# Patient Record
Sex: Female | Born: 1945 | Race: Black or African American | Hispanic: No
Health system: Midwestern US, Community
[De-identification: ages and names within clinical notes are randomized; demographics above are authoritative.]

## PROBLEM LIST (undated history)

## (undated) DIAGNOSIS — Z96651 Presence of right artificial knee joint: Secondary | ICD-10-CM

## (undated) DIAGNOSIS — M25561 Pain in right knee: Secondary | ICD-10-CM

## (undated) DIAGNOSIS — G8929 Other chronic pain: Secondary | ICD-10-CM

## (undated) DIAGNOSIS — M1990 Unspecified osteoarthritis, unspecified site: Secondary | ICD-10-CM

## (undated) DIAGNOSIS — K219 Gastro-esophageal reflux disease without esophagitis: Secondary | ICD-10-CM

## (undated) DIAGNOSIS — R001 Bradycardia, unspecified: Secondary | ICD-10-CM

## (undated) DIAGNOSIS — F32A Depression, unspecified: Secondary | ICD-10-CM

## (undated) DIAGNOSIS — G4700 Insomnia, unspecified: Secondary | ICD-10-CM

## (undated) DIAGNOSIS — N3941 Urge incontinence: Secondary | ICD-10-CM

## (undated) DIAGNOSIS — F419 Anxiety disorder, unspecified: Secondary | ICD-10-CM

## (undated) DIAGNOSIS — F329 Major depressive disorder, single episode, unspecified: Secondary | ICD-10-CM

## (undated) DIAGNOSIS — R413 Other amnesia: Secondary | ICD-10-CM

## (undated) HISTORY — PX: COLONOSCOPY: SHX174

## (undated) HISTORY — DX: Depression, unspecified: F32.A

## (undated) HISTORY — DX: Major depressive disorder, single episode, unspecified: F32.9

## (undated) HISTORY — DX: Anxiety disorder, unspecified: F41.9

## (undated) HISTORY — PX: BIOPSY THYROID: PRO38

## (undated) HISTORY — PX: OTHER SURGICAL HISTORY: SHX169

## (undated) HISTORY — DX: Other amnesia: R41.3

## (undated) HISTORY — DX: Insomnia, unspecified: G47.00

## (undated) HISTORY — DX: Unspecified osteoarthritis, unspecified site: M19.90

## (undated) HISTORY — DX: Gastro-esophageal reflux disease without esophagitis: K21.9

## (undated) HISTORY — DX: Urge incontinence: N39.41

---

## 1993-03-23 HISTORY — PX: ABDOMINAL HYSTERECTOMY: SHX81

## 1998-05-20 ENCOUNTER — Other Ambulatory Visit: Admission: RE | Admit: 1998-05-20 | Discharge: 1998-05-20 | Payer: Self-pay | Admitting: Obstetrics and Gynecology

## 1999-12-30 ENCOUNTER — Other Ambulatory Visit: Admission: RE | Admit: 1999-12-30 | Discharge: 1999-12-30 | Payer: Self-pay | Admitting: Obstetrics and Gynecology

## 2000-05-06 ENCOUNTER — Encounter: Admission: RE | Admit: 2000-05-06 | Discharge: 2000-05-06 | Payer: Self-pay

## 2000-05-25 ENCOUNTER — Encounter: Payer: Self-pay | Admitting: Neurosurgery

## 2000-05-26 ENCOUNTER — Encounter: Payer: Self-pay | Admitting: Neurosurgery

## 2000-05-26 ENCOUNTER — Inpatient Hospital Stay (HOSPITAL_COMMUNITY): Admission: RE | Admit: 2000-05-26 | Discharge: 2000-05-27 | Payer: Self-pay | Admitting: Neurosurgery

## 2000-07-05 ENCOUNTER — Encounter: Payer: Self-pay | Admitting: Neurosurgery

## 2000-07-05 ENCOUNTER — Encounter: Admission: RE | Admit: 2000-07-05 | Discharge: 2000-07-05 | Payer: Self-pay | Admitting: Neurosurgery

## 2001-02-09 ENCOUNTER — Other Ambulatory Visit: Admission: RE | Admit: 2001-02-09 | Discharge: 2001-02-09 | Payer: Self-pay | Admitting: Obstetrics and Gynecology

## 2002-05-08 ENCOUNTER — Other Ambulatory Visit: Admission: RE | Admit: 2002-05-08 | Discharge: 2002-05-08 | Payer: Self-pay | Admitting: Obstetrics and Gynecology

## 2002-08-23 ENCOUNTER — Encounter (INDEPENDENT_AMBULATORY_CARE_PROVIDER_SITE_OTHER): Payer: Self-pay | Admitting: Specialist

## 2002-08-23 ENCOUNTER — Ambulatory Visit (HOSPITAL_COMMUNITY): Admission: RE | Admit: 2002-08-23 | Discharge: 2002-08-23 | Payer: Self-pay | Admitting: Gastroenterology

## 2003-07-25 ENCOUNTER — Other Ambulatory Visit: Admission: RE | Admit: 2003-07-25 | Discharge: 2003-07-25 | Payer: Self-pay | Admitting: Obstetrics and Gynecology

## 2006-02-14 ENCOUNTER — Emergency Department (HOSPITAL_COMMUNITY): Admission: EM | Admit: 2006-02-14 | Discharge: 2006-02-14 | Payer: Self-pay | Admitting: Emergency Medicine

## 2008-03-23 HISTORY — PX: NECK SURGERY: SHX720

## 2009-04-05 ENCOUNTER — Encounter: Admission: RE | Admit: 2009-04-05 | Discharge: 2009-04-05 | Payer: Self-pay | Admitting: Family Medicine

## 2010-08-08 NOTE — Op Note (Signed)
[place]  Patient:    [name], [name]                      MRN: [id number] Proc. Date: 05/26/00 Adm. Date:  [id number] Attending:  Colon Branch                           Operative Report  PREOPERATIVE DIAGNOSIS:  Herniated nucleus pulposus, C5-6 and C6-7.  POSTOPERATIVE DIAGNOSIS:  Herniated nucleus pulposus, C5-6 and C6-7.  PROCEDURE:  Anterior cervical diskectomy and fusion, C5-6 and C6-7, with allograft, anterior cervical plate, and tong traction.  SURGEON:  Clydene Fake, M.D.  ASSISTANT:  Izell [place]. Elesa Hacker, M.D.  ANESTHESIA:  General endotracheal tube anesthesia.  ESTIMATED BLOOD LOSS:  Minimal.  BLOOD GIVEN:  None.  DRAINS:  None.  COMPLICATIONS:  None.  REASON FOR PROCEDURE:  The patient is a 65 year old woman who has been having neck pain and numbness and weakness on the left.  She had left arm pain, and that has improved a bit but continues with the other symptoms.  MRI was done showing cord compression at 5-6 and 6-7 due to disk herniation central at 5-6 and broad-based, worse on the left at 6-7.  Patient brought in for surgery.  DESCRIPTION OF PROCEDURE:  The patient was brought in the operating room and general anesthesia was induced.  The patient was placed in Gardner-Wells tongs for traction through the case because of the amount of disk collapse.  The patient was prepped and draped in a sterile fashion.  The site of incision was injected with 7 cc 1% lidocaine with epinephrine.  Incision was made from the midline to the anterior border of the sternocleidomastoid muscle on the left side of the neck.  Incision taken down to the platysma and hemostasis obtained with Bovie cauterization.  The platysma was incised with the Bovie, and then blunt dissection was taken through the cervical fascia to the anterior cervical spine.  A large osteophyte was seen, and a needle was placed at the disk space and an x-ray was obtained  showing this was the 6-7 interspace.  The needle was removed as the disk space was incised with a 15 blade.  We continued the dissection until we reached the 5-6 and 6-7 space, and the longus colli muscle was reflected laterally to each side using the Bovie.  The self-retaining retractor system was then placed.  Both levels, but especially 6-7, had large osteophytes, and they were removed with the Kerrison punch. Disk space was then incised, again with a 15 blade, and partial diskectomy performed with pituitary rongeurs at both levels.  Because of the amount of disk collapse, especially at 6-7, the weight was placed off the Gardner-Wells tongs for traction, and 10 pounds of weight was used to open up the disk spaces.  The Cloward drill guide was placed over the 6-7 interspace, and a 10 mm Cloward drill was used to drill through the interspace and end plates, leaving a thin rim of posterior cortex.  This was repeated at 5-6.  Microscope was brought into the field for this.  At this point, further microdissection using 1 and 2 mm Kerrison punches.  A thin rim of posterior cortex along with disk herniation did herniate out.  The compression of the cord was removed at both levels.  Bilateral foraminotomies were performed.  There were some osteophytes and severe foraminal narrowing at the  left 6-7 level, and that was opened up.  This was done at both levels, and afterwards there was no compression on the spinal cord, and bilateral foramina were open at both levels.  Depth of the vertebral bodies was then measured with a depth gauge, and a 12 mm bone dowel for each level was cut to be a couple millimeters shorter in length.  With continued traction on the Gardner-Wells tongs, the 6-7 and then the 5-6 bone dowels were tapped into place.  They were countersunk about 1 mm, and we were able to feel behind the bone plugs, making sure there was room between the bone graft and the dura.  The wound  was irrigated with antibiotic solution, and the microscope was removed from the field.  A Tether anterior cervical plate was then placed over the C5 through C7 vertebrae, and two screws were placed in the C5 and two in the C7.  X-rays were obtained, showing good position of the screws, plates, and bone grafts. The retractor system was removed, hemostasis obtained with bipolar cauterization and Gelfoam and thrombin.  The Gelfoam was then irrigated out, and the platysma was closed with 3-0 interrupted Vicryl suture.  The subcutaneous tissue was closed with the same and the skin closed with Steri-Strips.  Dressing was placed, the Gardner-Wells tongs removed, and the patient was awakened from anesthesia and transferred to the recovery room in stable condition. DD:  05/26/00 TD:  05/27/00 Job: [id number]

## 2010-08-08 NOTE — Op Note (Signed)
NAME:  [name], [name]                         ACCOUNT NO.:  [id number]   MEDICAL RECORD NO.:  [id number]                   PATIENT TYPE:  AMB   LOCATION:  ENDO                                 FACILITY:  [facility]   PHYSICIAN:  Anselmo Rod, M.D.               DATE OF BIRTH:  1945/08/26   DATE OF PROCEDURE:  08/23/2002  DATE OF DISCHARGE:                                 OPERATIVE REPORT   PROCEDURE PERFORMED:  Colonoscopy with cold biopsies x8.   ENDOSCOPIST:  Anselmo Rod, M.D.   INSTRUMENT USED:  Olympus videocolonoscope.   INDICATION FOR THE PROCEDURE:  Screening colonoscopy being performed of a 65-  year-old African-American female to rule out colon polyps, masses, or  fistula.   PREPROCEDURE PREPARATION:  Informed consent was procured from the patient.  The patient was fasted for eight hours prior to the procedure and prepped  with a bottle of magnesium citrate and a gallon of GoLYTELY the night prior  to the procedure.   PREPROCEDURE PHYSICAL:  VITAL SIGNS:  Stable.  NECK:  Supple.  CHEST:  Clear to auscultation.  S1 and S2 regular. No murmur, rub, gallops,  or racing.  ABDOMEN:  Soft with normal bowel sounds.   DESCRIPTION OF THE PROCEDURE:  The patient was placed in the left lateral  decubitus position and sedated with 80 mg of Demerol and  7 mg Versed  intravenously.  Once the patient was adequately sedated and maintained on  low-flow oxygen and continuous cardiac monitoring, the Olympus  videocolonoscope was advanced from the rectum to the cecum without  difficulty.  The patient has a good prep.  The appendiceal orifice and the  ileocecal valve were clearly visualized and photographed.  Multiple small  sessile polyps were biopsied from 90 cm and from the left colon.  Retroflexion in the rectum revealed no abnormalities except for small  sessile polyps that seemed hyperplastic in nature.  A small external  hemorrhoid was seen on withdrawal of the scope.  The  patient tolerated the  procedure well without complications.  There was no evidence of  diverticulosis.   IMPRESSION:  1. Multiple small sessile hyperplastic-appearing polyps biopsied from 90 cm     and from the left colon.  2. Small external hemorrhoid.  3. No large masses or polyps seen.  4. No evidence of diverticulosis.    RECOMMENDATIONS:  1. Await pathology results.  2. Avoid all nonsteroidals including aspirin for now.  3. Outpatient followup in the next two weeks to go over the recommendations.                                               Anselmo Rod, M.D.    JNM/MEDQ  D:  08/23/2002  T:  08/23/2002  Job:  [id number]   cc:   Georgianne Fick, M.D.  [address]  [place]  [place] [address]  Fax: [phone]

## 2010-08-08 NOTE — H&P (Signed)
[place]. [facility]  Patient:    [name], [name]                     MRN: [id number] Adm. Date:  05/26/00 Attending:  Clydene Fake, M.D.                         History and Physical  CHIEF COMPLAINT:  Neck and left arm pain. Numbness and weakness.  HISTORY OF PRESENT ILLNESS:  The patient is a 65 year old woman who six weeks ago or so started having neck pain that radiated down to the left shoulder blade and in the left arm towards the middle finger with numbness in the same distribution. She has had some general weakness in that left arm. She has been having trouble typing and doing things at work, because it hurts more when she uses the arm. She has been on Vioxx steroid Dosepak and I guess over time has had some improvement over the last couple of weeks, but still has significant weakness and numbness. MRI was done showing degeneration and bulging at 5-6 and 6-7 with a central disk herniation at 5-6 which flattens the spinal cord causing visible canal stenosis. Also loss of CSF anteriorly. At 6-7 there is a broad based disk bulge, worse on the left with biforaminal narrowing, again worse on the left. There may be a soft disk herniation on the left side of the foramen. The patient is being admitted for anterior diskectomy and fusion.  PAST MEDICAL HISTORY:  Otherwise negative.  PREVIOUS OPERATIONS:  TAH in 1983.  CURRENT MEDICATIONS: 1. Premarin q.d. 2. Voltaren b.i.d.  ALLERGIES:  No known drug allergies.  SOCIAL HISTORY:  Married. Employed at [employer]. She smokes a few cigarettes a day and is working on quitting. Does not drink alcohol.  REVIEW OF SYSTEMS:  Otherwise negative.  FAMILY HISTORY:  Noncontributory.  PHYSICAL EXAMINATION:  GENERAL:  The patient is pleasant.  VITAL SIGNS:  Weight 198, height 5 feet 9 inches. Blood pressure 123/74, pulse 71, temperature 99.  HEENT:  Unremarkable.  LUNGS:   Clear.  HEART:  Regular rhythm.  ABDOMEN:  Soft and nontender.  EXTREMITIES:  Intact. No edema.  NECK AND NEUROLOGICAL:  Neck shows range of motion in the neck decreased in extension and lateral bending did increase left-sided neck pain. Positive Spurlings to the left. There is some left paraspinous muscle spasms. There is no brachial plexus done. Rapid alternating movements are slightly slowed on the left side and intact on the right. There is 4/5 strength in the left triceps and wrist extensors are intact on the right side. Sensation is decreased to light touch and pinprick in the left C7 distribution, and maybe a little bit in the C6. Gait is normal, tandem gait is done fairly well.  ASSESSMENT:  The patient is with left C7 radiculopathy, may be a slight C6 component and very minimal myelopathy. The patient is being admitted for two-level anterior cervical diskectomy and fusion at 5-6 and 6-7. DD:  05/26/00 TD:  05/26/00 Job: [id number]

## 2010-08-08 NOTE — Discharge Summary (Signed)
[place]. [facility]  Patient:    [name], [name]                      MRN: [id number] Adm. Date:  [id number] Disc. Date: [id number] Attending:  Colon Branch                           Discharge Summary  DIAGNOSIS:  Herniated nucleus pulposus cervical vertebrae-5/6 and cervical vertebrae-6/7.  DISCHARGE DIAGNOSIS:  Herniated nucleus pulposus cervical vertebrae-5/6 and cervical vertebrae-6/7.  PROCEDURES:  Anterior cervical discectomy fusion with C5/6 and C6/7 with allograft anterior cervical plate and retraction.  HISTORY OF PRESENT ILLNESS:  The patient is a 65 year old woman who has been having three weeks of neck pain, left arm pain.  The pain has actually improved the last couple of weeks but she has developed a weakness and numbness of the left arm and that continues.  MRI was done showing large central disc herniation and 5/6 compression of the spinal cord and a left-sided disc herniation causing some canal stenosis but also C7 root compression along with severe spondylosis and osteophytes that are closing in on the foramen.  The patient is admitted for two level ACS.  HOSPITAL COURSE:  The patient was admitted on the day of surgery and underwent the procedure named above without complications.  Postoperatively, she was transferred to the recovery room and then to the floor.  There she has been eating, ambulating, complains of some moderate neck pain, and slight sore throat.  She has noticed that her arm feels different. She does not have that numbness feeling and feels like it is probably stronger.  Exam does show that sensation is intact and improved tricep strength. Dressing was clean, dry, and intact.  She has been doing well and will be discharged home in stable condition.  DISCHARGE MEDICATIONS:  Discharge medications are the same as prehospitalization except no NSAIDs for three weeks.  Vicodin p.r.n. and Flexeril p.r.n.  C-collar on at  all times.  Keep incision dry for five days. Diet as tolerated.  Follow up in my office in three weeks. DD:  05/27/00 TD:  05/27/00 Job: [id number]

## 2010-08-13 ENCOUNTER — Other Ambulatory Visit: Payer: Self-pay | Admitting: Podiatry

## 2011-03-24 HISTORY — PX: BUNIONECTOMY: SHX129

## 2011-04-21 DIAGNOSIS — Z1231 Encounter for screening mammogram for malignant neoplasm of breast: Secondary | ICD-10-CM | POA: Diagnosis not present

## 2011-05-06 DIAGNOSIS — M25519 Pain in unspecified shoulder: Secondary | ICD-10-CM | POA: Diagnosis not present

## 2011-05-07 DIAGNOSIS — M25519 Pain in unspecified shoulder: Secondary | ICD-10-CM | POA: Diagnosis not present

## 2011-05-14 DIAGNOSIS — M25519 Pain in unspecified shoulder: Secondary | ICD-10-CM | POA: Diagnosis not present

## 2011-05-21 DIAGNOSIS — M25519 Pain in unspecified shoulder: Secondary | ICD-10-CM | POA: Diagnosis not present

## 2011-06-08 DIAGNOSIS — M2020 Hallux rigidus, unspecified foot: Secondary | ICD-10-CM | POA: Diagnosis not present

## 2011-08-05 DIAGNOSIS — T84498A Other mechanical complication of other internal orthopedic devices, implants and grafts, initial encounter: Secondary | ICD-10-CM | POA: Diagnosis not present

## 2011-08-05 DIAGNOSIS — Y831 Surgical operation with implant of artificial internal device as the cause of abnormal reaction of the patient, or of later complication, without mention of misadventure at the time of the procedure: Secondary | ICD-10-CM | POA: Diagnosis not present

## 2011-08-05 DIAGNOSIS — T84099A Other mechanical complication of unspecified internal joint prosthesis, initial encounter: Secondary | ICD-10-CM | POA: Diagnosis not present

## 2011-08-05 DIAGNOSIS — M2020 Hallux rigidus, unspecified foot: Secondary | ICD-10-CM | POA: Diagnosis not present

## 2011-08-06 DIAGNOSIS — R5383 Other fatigue: Secondary | ICD-10-CM | POA: Diagnosis not present

## 2011-08-06 DIAGNOSIS — I498 Other specified cardiac arrhythmias: Secondary | ICD-10-CM | POA: Diagnosis not present

## 2011-08-06 DIAGNOSIS — R9431 Abnormal electrocardiogram [ECG] [EKG]: Secondary | ICD-10-CM | POA: Diagnosis not present

## 2011-08-06 DIAGNOSIS — R002 Palpitations: Secondary | ICD-10-CM | POA: Diagnosis not present

## 2011-08-06 DIAGNOSIS — R5381 Other malaise: Secondary | ICD-10-CM | POA: Diagnosis not present

## 2011-08-11 DIAGNOSIS — M2020 Hallux rigidus, unspecified foot: Secondary | ICD-10-CM | POA: Diagnosis not present

## 2011-08-20 DIAGNOSIS — M2020 Hallux rigidus, unspecified foot: Secondary | ICD-10-CM | POA: Diagnosis not present

## 2011-08-24 DIAGNOSIS — M79609 Pain in unspecified limb: Secondary | ICD-10-CM | POA: Diagnosis not present

## 2011-08-31 DIAGNOSIS — M19039 Primary osteoarthritis, unspecified wrist: Secondary | ICD-10-CM | POA: Diagnosis not present

## 2011-09-07 DIAGNOSIS — M79609 Pain in unspecified limb: Secondary | ICD-10-CM | POA: Diagnosis not present

## 2011-09-11 DIAGNOSIS — M25519 Pain in unspecified shoulder: Secondary | ICD-10-CM | POA: Diagnosis not present

## 2011-11-10 DIAGNOSIS — H04129 Dry eye syndrome of unspecified lacrimal gland: Secondary | ICD-10-CM | POA: Diagnosis not present

## 2011-11-10 DIAGNOSIS — H1045 Other chronic allergic conjunctivitis: Secondary | ICD-10-CM | POA: Diagnosis not present

## 2011-12-18 DIAGNOSIS — H40019 Open angle with borderline findings, low risk, unspecified eye: Secondary | ICD-10-CM | POA: Diagnosis not present

## 2011-12-18 DIAGNOSIS — H25049 Posterior subcapsular polar age-related cataract, unspecified eye: Secondary | ICD-10-CM | POA: Diagnosis not present

## 2011-12-18 DIAGNOSIS — H04129 Dry eye syndrome of unspecified lacrimal gland: Secondary | ICD-10-CM | POA: Diagnosis not present

## 2012-03-14 DIAGNOSIS — H04129 Dry eye syndrome of unspecified lacrimal gland: Secondary | ICD-10-CM | POA: Diagnosis not present

## 2012-03-14 DIAGNOSIS — H1045 Other chronic allergic conjunctivitis: Secondary | ICD-10-CM | POA: Diagnosis not present

## 2012-03-28 DIAGNOSIS — H1045 Other chronic allergic conjunctivitis: Secondary | ICD-10-CM | POA: Diagnosis not present

## 2012-03-31 DIAGNOSIS — H5710 Ocular pain, unspecified eye: Secondary | ICD-10-CM | POA: Diagnosis not present

## 2012-04-01 DIAGNOSIS — H5789 Other specified disorders of eye and adnexa: Secondary | ICD-10-CM | POA: Diagnosis not present

## 2012-04-01 DIAGNOSIS — H1045 Other chronic allergic conjunctivitis: Secondary | ICD-10-CM | POA: Diagnosis not present

## 2012-04-01 DIAGNOSIS — H04129 Dry eye syndrome of unspecified lacrimal gland: Secondary | ICD-10-CM | POA: Diagnosis not present

## 2012-04-04 DIAGNOSIS — H02849 Edema of unspecified eye, unspecified eyelid: Secondary | ICD-10-CM | POA: Diagnosis not present

## 2012-04-12 DIAGNOSIS — H10019 Acute follicular conjunctivitis, unspecified eye: Secondary | ICD-10-CM | POA: Diagnosis not present

## 2012-04-25 DIAGNOSIS — H10019 Acute follicular conjunctivitis, unspecified eye: Secondary | ICD-10-CM | POA: Diagnosis not present

## 2012-06-09 DIAGNOSIS — H9319 Tinnitus, unspecified ear: Secondary | ICD-10-CM | POA: Diagnosis not present

## 2012-06-09 DIAGNOSIS — H8110 Benign paroxysmal vertigo, unspecified ear: Secondary | ICD-10-CM | POA: Diagnosis not present

## 2012-06-09 DIAGNOSIS — M542 Cervicalgia: Secondary | ICD-10-CM | POA: Diagnosis not present

## 2012-06-13 DIAGNOSIS — R002 Palpitations: Secondary | ICD-10-CM | POA: Diagnosis not present

## 2012-06-13 DIAGNOSIS — H8110 Benign paroxysmal vertigo, unspecified ear: Secondary | ICD-10-CM | POA: Diagnosis not present

## 2012-06-13 DIAGNOSIS — I498 Other specified cardiac arrhythmias: Secondary | ICD-10-CM | POA: Diagnosis not present

## 2012-06-20 ENCOUNTER — Other Ambulatory Visit: Payer: Self-pay | Admitting: Internal Medicine

## 2012-06-20 DIAGNOSIS — R42 Dizziness and giddiness: Secondary | ICD-10-CM

## 2012-06-20 DIAGNOSIS — H9319 Tinnitus, unspecified ear: Secondary | ICD-10-CM | POA: Diagnosis not present

## 2012-06-20 DIAGNOSIS — Z0000 Encounter for general adult medical examination without abnormal findings: Secondary | ICD-10-CM | POA: Diagnosis not present

## 2012-06-20 DIAGNOSIS — I498 Other specified cardiac arrhythmias: Secondary | ICD-10-CM | POA: Diagnosis not present

## 2012-06-20 DIAGNOSIS — H9190 Unspecified hearing loss, unspecified ear: Secondary | ICD-10-CM | POA: Diagnosis not present

## 2012-06-22 ENCOUNTER — Ambulatory Visit
Admission: RE | Admit: 2012-06-22 | Discharge: 2012-06-22 | Disposition: A | Discharge status: Home / Self Care | Payer: Medicare Other | Source: Ambulatory Visit | Attending: Internal Medicine | Admitting: Internal Medicine

## 2012-06-22 DIAGNOSIS — R51 Headache: Secondary | ICD-10-CM | POA: Diagnosis not present

## 2012-06-22 DIAGNOSIS — H9319 Tinnitus, unspecified ear: Secondary | ICD-10-CM | POA: Diagnosis not present

## 2012-06-22 DIAGNOSIS — R42 Dizziness and giddiness: Secondary | ICD-10-CM

## 2012-06-22 MED ORDER — GADOBENATE DIMEGLUMINE 529 MG/ML IV SOLN
20.0000 mL | Freq: Once | INTRAVENOUS | Status: AC | PRN
  Administered 2012-06-22: 20 mL via INTRAVENOUS

## 2012-06-24 ENCOUNTER — Other Ambulatory Visit: Payer: Self-pay

## 2012-06-28 ENCOUNTER — Other Ambulatory Visit: Payer: Self-pay

## 2012-07-20 DIAGNOSIS — R5381 Other malaise: Secondary | ICD-10-CM | POA: Diagnosis not present

## 2012-07-20 DIAGNOSIS — H9319 Tinnitus, unspecified ear: Secondary | ICD-10-CM | POA: Diagnosis not present

## 2012-07-20 DIAGNOSIS — F329 Major depressive disorder, single episode, unspecified: Secondary | ICD-10-CM | POA: Diagnosis not present

## 2012-08-18 DIAGNOSIS — J309 Allergic rhinitis, unspecified: Secondary | ICD-10-CM | POA: Diagnosis not present

## 2012-08-18 DIAGNOSIS — F39 Unspecified mood [affective] disorder: Secondary | ICD-10-CM | POA: Diagnosis not present

## 2012-08-18 DIAGNOSIS — IMO0001 Reserved for inherently not codable concepts without codable children: Secondary | ICD-10-CM | POA: Diagnosis not present

## 2012-08-18 DIAGNOSIS — R5383 Other fatigue: Secondary | ICD-10-CM | POA: Diagnosis not present

## 2012-08-18 DIAGNOSIS — R5381 Other malaise: Secondary | ICD-10-CM | POA: Diagnosis not present

## 2012-08-30 DIAGNOSIS — M25519 Pain in unspecified shoulder: Secondary | ICD-10-CM | POA: Diagnosis not present

## 2012-09-08 DIAGNOSIS — Z1159 Encounter for screening for other viral diseases: Secondary | ICD-10-CM | POA: Diagnosis not present

## 2012-09-08 DIAGNOSIS — IMO0001 Reserved for inherently not codable concepts without codable children: Secondary | ICD-10-CM | POA: Diagnosis not present

## 2012-09-08 DIAGNOSIS — R748 Abnormal levels of other serum enzymes: Secondary | ICD-10-CM | POA: Diagnosis not present

## 2012-09-08 DIAGNOSIS — R5381 Other malaise: Secondary | ICD-10-CM | POA: Diagnosis not present

## 2012-09-09 DIAGNOSIS — Z1231 Encounter for screening mammogram for malignant neoplasm of breast: Secondary | ICD-10-CM | POA: Diagnosis not present

## 2012-09-09 DIAGNOSIS — Z803 Family history of malignant neoplasm of breast: Secondary | ICD-10-CM | POA: Diagnosis not present

## 2012-09-19 ENCOUNTER — Ambulatory Visit (INDEPENDENT_AMBULATORY_CARE_PROVIDER_SITE_OTHER): Payer: Medicare Other

## 2012-09-19 ENCOUNTER — Ambulatory Visit (INDEPENDENT_AMBULATORY_CARE_PROVIDER_SITE_OTHER): Payer: Medicare Other | Admitting: Diagnostic Neuroimaging

## 2012-09-19 DIAGNOSIS — R748 Abnormal levels of other serum enzymes: Secondary | ICD-10-CM | POA: Diagnosis not present

## 2012-09-19 DIAGNOSIS — M3320 Polymyositis, organ involvement unspecified: Secondary | ICD-10-CM

## 2012-09-19 DIAGNOSIS — Z0289 Encounter for other administrative examinations: Secondary | ICD-10-CM

## 2012-09-19 DIAGNOSIS — M6281 Muscle weakness (generalized): Secondary | ICD-10-CM

## 2012-09-19 NOTE — Procedures (Addendum)
   [facility]  NCS (NERVE CONDUCTION STUDY) WITH EMG (ELECTROMYOGRAPHY) REPORT   STUDY DATE: 09/19/12 PATIENT NAME: [name] DOB: 09/22/1945 MRN: [id number]  ORDERING CLINICIAN: Dierdre Forth  TECHNOLOGIST: Gearldine Shown ELECTROMYOGRAPHER: Glenford Bayley. Achsah Mcquade, MD  CLINICAL INFORMATION: 67 year old female with bilateral proximal leg weakness. Abnormal CK elevation. Evaluate for myopathy. Exam shows diffuse weakness of bilateral hip flexon 3/5, knee extension 4/5, knee flexion 4/5, foot dorsiflexion 4/5. Reflexes are 2+ throughout.  FINDINGS: NERVE CONDUCTION STUDY: Right median, right ulnar, right peroneal and right tibial motor responses have normal distal latencies, amplitudes, conduction velocities and F-wave latencies. Right H reflex response with stimulation of the tibial nerves and recording over the soleus muscle is normal. Right median, right ulnar, right superficial peroneal sensory responses are normal.  NEEDLE ELECTROMYOGRAPHY: Needle examination of selected muscles of the right lower extremity (iliopsoas, vastus medialis, tibialis anterior, gastrocnemius, gluteus medius) shows no abnormal spontaneous activity at rest and normal motor unit recruitment on exertion. Right L4-5 and L5-S1 paraspinal muscles are normal.  IMPRESSION:  This is a normal study. No electrodiagnostic evidence of large fiber neuropathy, lumbar radiculopathy or myopathy at this time.   INTERPRETING PHYSICIAN:  Suanne Marker, MD Certified in Neurology, Neurophysiology and Neuroimaging  [facility] [address] [place], [place] [address] [phone]

## 2012-09-21 DIAGNOSIS — M3320 Polymyositis, organ involvement unspecified: Secondary | ICD-10-CM | POA: Diagnosis not present

## 2012-09-22 ENCOUNTER — Encounter (INDEPENDENT_AMBULATORY_CARE_PROVIDER_SITE_OTHER): Payer: Self-pay

## 2012-09-28 ENCOUNTER — Ambulatory Visit (INDEPENDENT_AMBULATORY_CARE_PROVIDER_SITE_OTHER): Payer: Medicare Other | Admitting: General Surgery

## 2012-09-28 ENCOUNTER — Encounter (INDEPENDENT_AMBULATORY_CARE_PROVIDER_SITE_OTHER): Payer: Self-pay | Admitting: General Surgery

## 2012-09-28 ENCOUNTER — Encounter (HOSPITAL_BASED_OUTPATIENT_CLINIC_OR_DEPARTMENT_OTHER): Payer: Self-pay | Admitting: *Deleted

## 2012-09-28 VITALS — BP 128/76 | HR 64 | Temp 97.1°F | Resp 15 | Ht 68.0 in | Wt 221.2 lb

## 2012-09-28 DIAGNOSIS — M3320 Polymyositis, organ involvement unspecified: Secondary | ICD-10-CM

## 2012-09-28 NOTE — Progress Notes (Signed)
Patient ID: [name], female   DOB: 15-Apr-1945, 67 y.o.   MRN: [id number]  Chief Complaint  Patient presents with  . New Evaluation    quadricep muscle biospy    HPI [name] is a 67 y.o. female.   HPI  She is referred by Dr. Dierdre Forth for a quadriceps muscle biopsy. 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 She began having weakness in April. She is noted to have elevated CK and aldolase levels.  She is here to discuss quadriceps muscle biopsy.  Past Medical History  Diagnosis Date  . Arthritis     Past Surgical History  Procedure Laterality Date  . Neck surgery  2010    disc fused     Family History  Problem Relation Age of Onset  . Hypertension Mother   . Cancer Sister     Social History History  Substance Use Topics  . Smoking status: Former Smoker    Quit date: 03/23/2008  . Smokeless tobacco: Never Used  . Alcohol Use: No    Not on File  Current Outpatient Prescriptions  Medication Sig Dispense Refill  . fish oil-omega-3 fatty acids 1000 MG capsule Take 2 g by mouth daily.      . Multiple Vitamins-Minerals (CENTRUM SILVER ADULT 50+ PO) Take by mouth.      . sertraline (ZOLOFT) 100 MG tablet Take 100 mg by mouth daily.       No current facility-administered medications for this visit.    Review of Systems Review of Systems  Respiratory: Negative.   Cardiovascular: Negative.   Musculoskeletal: Positive for arthralgias.  Neurological: Positive for weakness.    Blood pressure 128/76, pulse 64, temperature 97.1 F (36.2 C), temperature source Temporal, resp. rate 15, height 5\' 8"  (1.727 m), weight 221 lb 3.2 oz (100.336 kg).  Physical Exam Physical Exam  Constitutional:  Obese female in NAD.  HENT:  Head: Normocephalic and atraumatic.  Musculoskeletal:  4/5 hip flexor strength bilaterally. Also has some proximal weakness in both upper extremities. No scars or masses on left thigh area.     Data Reviewed Notes from Dr. Dierdre Forth.  Assessment    Dry  suspicion for polymyositis.  She states she relies more on her right leg than her left leg.     Plan    Left quadriceps muscle biopsy. The procedure and risks were discussed with her. Risks include but are not limited to bleeding, infection, which milligrams, and reaction to the anesthesia. Both she and her husband seem to understand and agree with the plan.        [name] J 09/28/2012, 12:10 PM

## 2012-09-28 NOTE — Patient Instructions (Signed)
Stop your fish oil 3-5 days before the surgery.

## 2012-09-28 NOTE — Progress Notes (Signed)
[facility] ordered [id number]-to come in for labs

## 2012-09-30 ENCOUNTER — Encounter (HOSPITAL_BASED_OUTPATIENT_CLINIC_OR_DEPARTMENT_OTHER)
Admission: RE | Admit: 2012-09-30 | Discharge: 2012-09-30 | Disposition: A | Discharge status: Home / Self Care | Payer: Medicare Other | Source: Ambulatory Visit | Attending: General Surgery | Admitting: General Surgery

## 2012-09-30 DIAGNOSIS — Z79899 Other long term (current) drug therapy: Secondary | ICD-10-CM | POA: Diagnosis not present

## 2012-09-30 DIAGNOSIS — M6250 Muscle wasting and atrophy, not elsewhere classified, unspecified site: Secondary | ICD-10-CM | POA: Diagnosis not present

## 2012-09-30 DIAGNOSIS — M6281 Muscle weakness (generalized): Secondary | ICD-10-CM | POA: Diagnosis not present

## 2012-09-30 DIAGNOSIS — Z6833 Body mass index (BMI) 33.0-33.9, adult: Secondary | ICD-10-CM | POA: Diagnosis not present

## 2012-09-30 DIAGNOSIS — E669 Obesity, unspecified: Secondary | ICD-10-CM | POA: Diagnosis not present

## 2012-09-30 DIAGNOSIS — Z87891 Personal history of nicotine dependence: Secondary | ICD-10-CM | POA: Diagnosis not present

## 2012-09-30 DIAGNOSIS — Z809 Family history of malignant neoplasm, unspecified: Secondary | ICD-10-CM | POA: Diagnosis not present

## 2012-09-30 DIAGNOSIS — R748 Abnormal levels of other serum enzymes: Secondary | ICD-10-CM | POA: Diagnosis not present

## 2012-09-30 DIAGNOSIS — M129 Arthropathy, unspecified: Secondary | ICD-10-CM | POA: Diagnosis not present

## 2012-09-30 LAB — CBC WITH DIFFERENTIAL/PLATELET
Basophils Absolute: 0 10*3/uL (ref 0.0–0.1)
Lymphocytes Relative: 33 % (ref 12–46)
Lymphs Abs: 2 10*3/uL (ref 0.7–4.0)
Neutrophils Relative %: 55 % (ref 43–77)
Platelets: 213 10*3/uL (ref 150–400)
RBC: 4.72 MIL/uL (ref 3.87–5.11)
WBC: 6.1 10*3/uL (ref 4.0–10.5)

## 2012-09-30 LAB — COMPREHENSIVE METABOLIC PANEL
ALT: 17 U/L (ref 0–35)
AST: 22 U/L (ref 0–37)
Alkaline Phosphatase: 84 U/L (ref 39–117)
CO2: 28 mEq/L (ref 19–32)
GFR calc Af Amer: 68 mL/min — ABNORMAL LOW (ref >90)
GFR calc non Af Amer: 59 mL/min — ABNORMAL LOW (ref >90)
Glucose, Bld: 95 mg/dL (ref 70–99)
Potassium: 4.1 mEq/L (ref 3.5–5.1)
Sodium: 138 mEq/L (ref 135–145)
Total Protein: 6.9 g/dL (ref 6.0–8.3)

## 2012-10-03 ENCOUNTER — Encounter (HOSPITAL_BASED_OUTPATIENT_CLINIC_OR_DEPARTMENT_OTHER): Payer: Self-pay | Admitting: Certified Registered"

## 2012-10-03 ENCOUNTER — Ambulatory Visit (HOSPITAL_BASED_OUTPATIENT_CLINIC_OR_DEPARTMENT_OTHER): Payer: Medicare Other | Admitting: Certified Registered"

## 2012-10-03 ENCOUNTER — Ambulatory Visit (HOSPITAL_BASED_OUTPATIENT_CLINIC_OR_DEPARTMENT_OTHER)
Admission: RE | Admit: 2012-10-03 | Discharge: 2012-10-03 | Disposition: A | Discharge status: Home / Self Care | Payer: Medicare Other | Source: Ambulatory Visit | Attending: General Surgery | Admitting: General Surgery

## 2012-10-03 ENCOUNTER — Encounter (HOSPITAL_BASED_OUTPATIENT_CLINIC_OR_DEPARTMENT_OTHER)
Admission: RE | Disposition: A | Discharge status: Home / Self Care | Payer: Self-pay | Source: Ambulatory Visit | Attending: General Surgery

## 2012-10-03 DIAGNOSIS — R748 Abnormal levels of other serum enzymes: Secondary | ICD-10-CM | POA: Insufficient documentation

## 2012-10-03 DIAGNOSIS — M3320 Polymyositis, organ involvement unspecified: Secondary | ICD-10-CM | POA: Diagnosis not present

## 2012-10-03 DIAGNOSIS — Z6833 Body mass index (BMI) 33.0-33.9, adult: Secondary | ICD-10-CM | POA: Insufficient documentation

## 2012-10-03 DIAGNOSIS — Z79899 Other long term (current) drug therapy: Secondary | ICD-10-CM | POA: Insufficient documentation

## 2012-10-03 DIAGNOSIS — Z87891 Personal history of nicotine dependence: Secondary | ICD-10-CM | POA: Insufficient documentation

## 2012-10-03 DIAGNOSIS — M6281 Muscle weakness (generalized): Secondary | ICD-10-CM

## 2012-10-03 DIAGNOSIS — M129 Arthropathy, unspecified: Secondary | ICD-10-CM | POA: Diagnosis not present

## 2012-10-03 DIAGNOSIS — E669 Obesity, unspecified: Secondary | ICD-10-CM | POA: Insufficient documentation

## 2012-10-03 DIAGNOSIS — M6250 Muscle wasting and atrophy, not elsewhere classified, unspecified site: Secondary | ICD-10-CM | POA: Diagnosis not present

## 2012-10-03 DIAGNOSIS — Z809 Family history of malignant neoplasm, unspecified: Secondary | ICD-10-CM | POA: Insufficient documentation

## 2012-10-03 HISTORY — PX: MUSCLE BIOPSY: SHX716

## 2012-10-03 SURGERY — MUSCLE BIOPSY
Anesthesia: Monitor Anesthesia Care | Site: Leg Upper | Laterality: Left | Wound class: Clean

## 2012-10-03 MED ORDER — ACETAMINOPHEN 325 MG PO TABS: 650.0000 mg | ORAL_TABLET | ORAL | Status: DC | PRN

## 2012-10-03 MED ORDER — CEFAZOLIN SODIUM-DEXTROSE 2-3 GM-% IV SOLR
2.0000 g | INTRAVENOUS | Status: AC
  Administered 2012-10-03: 2 g via INTRAVENOUS

## 2012-10-03 MED ORDER — FENTANYL CITRATE 0.05 MG/ML IJ SOLN: 25.0000 ug | INTRAMUSCULAR | Status: DC | PRN

## 2012-10-03 MED ORDER — MIDAZOLAM HCL 5 MG/5ML IJ SOLN
INTRAMUSCULAR | Status: DC | PRN
  Administered 2012-10-03: 2 mg via INTRAVENOUS

## 2012-10-03 MED ORDER — ACETAMINOPHEN 650 MG RE SUPP: 650.0000 mg | RECTAL | Status: DC | PRN

## 2012-10-03 MED ORDER — LIDOCAINE HCL (CARDIAC) 20 MG/ML IV SOLN
INTRAVENOUS | Status: DC | PRN
  Administered 2012-10-03: 40 mg via INTRAVENOUS

## 2012-10-03 MED ORDER — OXYCODONE HCL 5 MG PO TABS: 5.0000 mg | ORAL_TABLET | ORAL | Status: DC | PRN

## 2012-10-03 MED ORDER — ONDANSETRON HCL 4 MG/2ML IJ SOLN: 4.0000 mg | Freq: Four times a day (QID) | INTRAMUSCULAR | Status: DC | PRN

## 2012-10-03 MED ORDER — PROPOFOL INFUSION 10 MG/ML OPTIME
INTRAVENOUS | Status: DC | PRN
  Administered 2012-10-03: 140 ug/kg/min via INTRAVENOUS

## 2012-10-03 MED ORDER — SODIUM CHLORIDE 0.9 % IJ SOLN: 3.0000 mL | INTRAMUSCULAR | Status: DC | PRN

## 2012-10-03 MED ORDER — FENTANYL CITRATE 0.05 MG/ML IJ SOLN
INTRAMUSCULAR | Status: DC | PRN
  Administered 2012-10-03 (×2): 50 ug via INTRAVENOUS

## 2012-10-03 MED ORDER — OXYCODONE HCL 5 MG PO TABS
5.0000 mg | ORAL_TABLET | Freq: Once | ORAL | Status: AC | PRN
  Administered 2012-10-03: 5 mg via ORAL

## 2012-10-03 MED ORDER — OXYCODONE HCL 5 MG/5ML PO SOLN: 5.0000 mg | Freq: Once | ORAL | Status: AC | PRN

## 2012-10-03 MED ORDER — HYDROCODONE-ACETAMINOPHEN 5-325 MG PO TABS
1.0000 | ORAL_TABLET | ORAL | Status: DC | PRN
Start: 2012-10-03 — End: 2013-04-20

## 2012-10-03 MED ORDER — ONDANSETRON HCL 4 MG/2ML IJ SOLN
INTRAMUSCULAR | Status: DC | PRN
  Administered 2012-10-03: 4 mg via INTRAVENOUS

## 2012-10-03 MED ORDER — LIDOCAINE-EPINEPHRINE (PF) 1 %-1:200000 IJ SOLN
INTRAMUSCULAR | Status: DC | PRN
  Administered 2012-10-03: 14 mL

## 2012-10-03 MED ORDER — LACTATED RINGERS IV SOLN
INTRAVENOUS | Status: DC
  Administered 2012-10-03: 10:00:00 via INTRAVENOUS

## 2012-10-03 SURGICAL SUPPLY — 44 items
APL SKNCLS STERI-STRIP NONHPOA (GAUZE/BANDAGES/DRESSINGS) ×1
BENZOIN TINCTURE PRP APPL 2/3 (GAUZE/BANDAGES/DRESSINGS) ×2 IMPLANT
BLADE SURG 15 STRL LF DISP TIS (BLADE) ×1 IMPLANT
BLADE SURG 15 STRL SS (BLADE) ×2
BLADE SURG ROTATE 9660 (MISCELLANEOUS) IMPLANT
CANISTER SUCTION 1200CC (MISCELLANEOUS) IMPLANT
CHLORAPREP W/TINT 26ML (MISCELLANEOUS) ×2 IMPLANT
CLEANER CAUTERY TIP 5X5 PAD (MISCELLANEOUS) ×1 IMPLANT
CLOTH BEACON ORANGE TIMEOUT ST (SAFETY) ×2 IMPLANT
COVER MAYO STAND STRL (DRAPES) ×2 IMPLANT
COVER TABLE BACK 60X90 (DRAPES) ×2 IMPLANT
DECANTER SPIKE VIAL GLASS SM (MISCELLANEOUS) IMPLANT
DRAPE LAPAROTOMY T 102X78X121 (DRAPES) ×2 IMPLANT
DRAPE UTILITY XL STRL (DRAPES) ×2 IMPLANT
DRSG TEGADERM 4X4.75 (GAUZE/BANDAGES/DRESSINGS) ×2 IMPLANT
ELECT REM PT RETURN 9FT ADLT (ELECTROSURGICAL) ×2
ELECTRODE REM PT RTRN 9FT ADLT (ELECTROSURGICAL) ×1 IMPLANT
GAUZE SPONGE 4X4 12PLY STRL LF (GAUZE/BANDAGES/DRESSINGS) ×2 IMPLANT
GAUZE SPONGE 4X4 16PLY XRAY LF (GAUZE/BANDAGES/DRESSINGS) IMPLANT
GLOVE BIOGEL M 7.0 STRL (GLOVE) ×2 IMPLANT
GLOVE BIOGEL PI IND STRL 7.5 (GLOVE) ×1 IMPLANT
GLOVE BIOGEL PI IND STRL 8.5 (GLOVE) ×1 IMPLANT
GLOVE BIOGEL PI INDICATOR 7.5 (GLOVE) ×1
GLOVE BIOGEL PI INDICATOR 8.5 (GLOVE) ×1
GLOVE ECLIPSE 8.0 STRL XLNG CF (GLOVE) ×2 IMPLANT
GOWN PREVENTION PLUS XLARGE (GOWN DISPOSABLE) ×2 IMPLANT
NEEDLE HYPO 25X1 1.5 SAFETY (NEEDLE) ×2 IMPLANT
NS IRRIG 1000ML POUR BTL (IV SOLUTION) ×2 IMPLANT
PACK BASIN DAY SURGERY FS (CUSTOM PROCEDURE TRAY) ×2 IMPLANT
PAD CLEANER CAUTERY TIP 5X5 (MISCELLANEOUS) ×1
PENCIL BUTTON HOLSTER BLD 10FT (ELECTRODE) ×2 IMPLANT
SLEEVE SCD COMPRESS KNEE MED (MISCELLANEOUS) IMPLANT
STRIP CLOSURE SKIN 1/2X4 (GAUZE/BANDAGES/DRESSINGS) ×2 IMPLANT
SUT MON AB 4-0 PC3 18 (SUTURE) ×2 IMPLANT
SUT PROLENE 2 0 CT2 30 (SUTURE) IMPLANT
SUT VIC AB 2-0 SH 27 (SUTURE) ×2
SUT VIC AB 2-0 SH 27XBRD (SUTURE) ×1 IMPLANT
SUT VIC AB 3-0 SH 27 (SUTURE) ×2
SUT VIC AB 3-0 SH 27X BRD (SUTURE) ×1 IMPLANT
SYR CONTROL 10ML LL (SYRINGE) ×2 IMPLANT
TOWEL OR 17X24 6PK STRL BLUE (TOWEL DISPOSABLE) ×2 IMPLANT
TOWEL OR NON WOVEN STRL DISP B (DISPOSABLE) ×2 IMPLANT
TUBE CONNECTING 20X1/4 (TUBING) IMPLANT
YANKAUER SUCT BULB TIP NO VENT (SUCTIONS) IMPLANT

## 2012-10-03 NOTE — Interval H&P Note (Signed)
History and Physical Interval Note:  10/03/2012 10:29 AM  [name]  has presented today for surgery, with the diagnosis of polymyositis  The various methods of treatment have been discussed with the patient and family. After consideration of risks, benefits and other options for treatment, the patient has consented to  Procedure(s): LEFT QUADRICEP MUSCLE BIOPSY (Left) as a surgical intervention .  The patient's history has been reviewed, patient examined, no change in status, stable for surgery.  I have reviewed the patient's chart and labs.  Questions were answered to the patient's satisfaction.     Leary Mcnulty [place]la [place]

## 2012-10-03 NOTE — Transfer of Care (Signed)
Immediate Anesthesia Transfer of Care Note  Patient: [name]  Procedure(s) Performed: Procedure(s): LEFT QUADRICEP MUSCLE BIOPSY (Left)  Patient Location: PACU  Anesthesia Type:MAC  Level of Consciousness: awake, alert , oriented and patient cooperative  Airway & Oxygen Therapy: Patient Spontanous Breathing and Patient connected to face mask oxygen  Post-op Assessment: Report given to PACU RN and Post -op Vital signs reviewed and stable  Post vital signs: Reviewed and stable  Complications: No apparent anesthesia complications

## 2012-10-03 NOTE — Anesthesia Procedure Notes (Signed)
Procedure Name: MAC Date/Time: 10/03/2012 10:52 AM Performed by: Verlan Friends Pre-anesthesia Checklist: Patient identified, Timeout performed, Emergency Drugs available, Suction available and Patient being monitored Patient Re-evaluated:Patient Re-evaluated prior to inductionOxygen Delivery Method: Simple face mask Placement Confirmation: positive ETCO2

## 2012-10-03 NOTE — Anesthesia Postprocedure Evaluation (Signed)
Anesthesia Post Note  Patient: [name]  Procedure(s) Performed: Procedure(s) (LRB): LEFT QUADRICEP MUSCLE BIOPSY (Left)  Anesthesia type: MAC  Patient location: PACU  Post pain: Pain level controlled and Adequate analgesia  Post assessment: Post-op Vital signs reviewed, Patient's Cardiovascular Status Stable and Respiratory Function Stable  Last Vitals:  Filed Vitals:   10/03/12 1215  BP: 133/56  Pulse: 44  Temp:   Resp: 16    Post vital signs: Reviewed and stable  Level of consciousness: awake, alert  and oriented  Complications: No apparent anesthesia complications

## 2012-10-03 NOTE — Anesthesia Preprocedure Evaluation (Signed)
Anesthesia Evaluation  Patient identified by MRN, date of birth, ID band Patient awake    Reviewed: Allergy & Precautions, H&P , NPO status , Patient's Chart, lab work & pertinent test results  Airway Mallampati: II  Neck ROM: full    Dental   Pulmonary former smoker,          Cardiovascular     Neuro/Psych    GI/Hepatic   Endo/Other  obese  Renal/GU      Musculoskeletal  (+) Arthritis -,   Abdominal   Peds  Hematology   Anesthesia Other Findings   Reproductive/Obstetrics                           Anesthesia Physical Anesthesia Plan  ASA: II  Anesthesia Plan: MAC   Post-op Pain Management:    Induction: Intravenous  Airway Management Planned: Simple Face Mask  Additional Equipment:   Intra-op Plan:   Post-operative Plan:   Informed Consent: I have reviewed the patients History and Physical, chart, labs and discussed the procedure including the risks, benefits and alternatives for the proposed anesthesia with the patient or authorized representative who has indicated his/her understanding and acceptance.     Plan Discussed with: CRNA, Anesthesiologist and Surgeon  Anesthesia Plan Comments:         Anesthesia Quick Evaluation

## 2012-10-03 NOTE — Op Note (Signed)
Operative Note  LUCINDY BOREL female 67 y.o. 10/03/2012  PREOPERATIVE DX:  Diffuse muscle weakness, possibly polymyositis  POSTOPERATIVE DX:  Same  PROCEDURE:  Left quadriceps muscle biopsy         Surgeon: Adolph Pollack   Assistants: None  Anesthesia: Monitored Local Anesthesia with Sedation  Indications:   This is a 68 year old female with progressive diffuse muscle weakness and elevated CPKs. There is a concern for polymyositis. She now presents for left quadriceps muscle biopsy.    Procedure Detail:  She was seen in the holding area and the left thigh marked by initial. She is brought to the operating room, placed supine on the operating table, and given intravenous sedation. The anterior aspect of the left thigh was sterilely prepped and draped.  Local anesthetic consisting of Xylocaine with epinephrine was infiltrated in the subdermal and subcutaneous areas. A longitudinal small incision was made through the skin and subcutaneous tissue until the fascia was identified. A small incision was made in the fascia and underlying quadriceps muscle identified. A 3 cm segment of muscle was then removed by clamping it between hemostats and excising it sharply. This was sent to pathology.  The ends of the cut muscle the suture-ligated with 3-0 Vicryl suture. Some other bleeding areas of the muscle were treated electrocautery and hemostasis was adequate.  The fascia was then closed with a running 2-0 Vicryl suture. The subcutaneous tissues were approximated with a running 3-0 Vicryl suture. The skin is close with 4-0 Monocryl subcuticular stitch. Steri-Strips and sterile dressing applied.  She tolerated the procedure well without any apparent complications and was taken to the recovery room in satisfactory condition.  Estimated Blood Loss:  Minimal         Drains: none  Blood Given: none          Specimens: A small segment of left quadriceps muscle        Complications:  * No  complications entered in OR log *         Disposition: PACU - hemodynamically stable.         Condition: stable

## 2012-10-03 NOTE — H&P (View-Only) (Signed)
Patient ID: [name], female   DOB: 08/11/1945, 67 y.o.   MRN: [id number]  Chief Complaint  Patient presents with  . New Evaluation    quadricep muscle biospy    HPI [name] is a 67 y.o. female.   HPI  She is referred by Dr. Beekman for a quadriceps muscle biopsy. 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 She began having weakness in April. She is noted to have elevated CK and aldolase levels.  She is here to discuss quadriceps muscle biopsy.  Past Medical History  Diagnosis Date  . Arthritis     Past Surgical History  Procedure Laterality Date  . Neck surgery  2010    disc fused     Family History  Problem Relation Age of Onset  . Hypertension Mother   . Cancer Sister     Social History History  Substance Use Topics  . Smoking status: Former Smoker    Quit date: 03/23/2008  . Smokeless tobacco: Never Used  . Alcohol Use: No    Not on File  Current Outpatient Prescriptions  Medication Sig Dispense Refill  . fish oil-omega-3 fatty acids 1000 MG capsule Take 2 g by mouth daily.      . Multiple Vitamins-Minerals (CENTRUM SILVER ADULT 50+ PO) Take by mouth.      . sertraline (ZOLOFT) 100 MG tablet Take 100 mg by mouth daily.       No current facility-administered medications for this visit.    Review of Systems Review of Systems  Respiratory: Negative.   Cardiovascular: Negative.   Musculoskeletal: Positive for arthralgias.  Neurological: Positive for weakness.    Blood pressure 128/76, pulse 64, temperature 97.1 F (36.2 C), temperature source Temporal, resp. rate 15, height 5' 8" (1.727 m), weight 221 lb 3.2 oz (100.336 kg).  Physical Exam Physical Exam  Constitutional:  Obese female in NAD.  HENT:  Head: Normocephalic and atraumatic.  Musculoskeletal:  4/5 hip flexor strength bilaterally. Also has some proximal weakness in both upper extremities. No scars or masses on left thigh area.     Data Reviewed Notes from Dr. Beekman.  Assessment    Dry  suspicion for polymyositis.  She states she relies more on her right leg than her left leg.     Plan    Left quadriceps muscle biopsy. The procedure and risks were discussed with her. Risks include but are not limited to bleeding, infection, which milligrams, and reaction to the anesthesia. Both she and her husband seem to understand and agree with the plan.        [name] J 09/28/2012, 12:10 PM    

## 2012-10-04 ENCOUNTER — Encounter (HOSPITAL_BASED_OUTPATIENT_CLINIC_OR_DEPARTMENT_OTHER): Payer: Self-pay | Admitting: General Surgery

## 2012-10-07 DIAGNOSIS — M3320 Polymyositis, organ involvement unspecified: Secondary | ICD-10-CM | POA: Diagnosis not present

## 2012-10-13 ENCOUNTER — Encounter (INDEPENDENT_AMBULATORY_CARE_PROVIDER_SITE_OTHER): Payer: Self-pay

## 2012-10-14 ENCOUNTER — Telehealth (INDEPENDENT_AMBULATORY_CARE_PROVIDER_SITE_OTHER): Payer: Self-pay

## 2012-10-14 NOTE — Telephone Encounter (Signed)
Notified Dr. Shawnee Knapp nurse that muscle bx results will be faxed to them today.  Dr. Maris Berger instructions were that the pt would f/u with Dr. Dierdre Forth.

## 2012-10-14 NOTE — Telephone Encounter (Signed)
Muscle biopsy pathology does not show polymyositis.  Message left on pt's cell and home number to call our office for results.  Please inform pt that results have been faxed to Dr. Shawnee Knapp office.  They should contact her for f/u.

## 2012-10-14 NOTE — Telephone Encounter (Signed)
Patient called back and I made her aware pathology negative for polymyositis and results forwarded to Dr Dierdre Forth. She will follow up with him.

## 2012-10-24 ENCOUNTER — Encounter (INDEPENDENT_AMBULATORY_CARE_PROVIDER_SITE_OTHER): Payer: Self-pay

## 2012-10-25 ENCOUNTER — Ambulatory Visit (INDEPENDENT_AMBULATORY_CARE_PROVIDER_SITE_OTHER): Payer: Medicare Other | Admitting: General Surgery

## 2012-10-25 ENCOUNTER — Encounter (INDEPENDENT_AMBULATORY_CARE_PROVIDER_SITE_OTHER): Payer: Self-pay | Admitting: General Surgery

## 2012-10-25 VITALS — BP 118/70 | HR 61 | Temp 96.1°F | Resp 14 | Ht 69.0 in | Wt 217.8 lb

## 2012-10-25 DIAGNOSIS — Z9889 Other specified postprocedural states: Secondary | ICD-10-CM

## 2012-10-25 DIAGNOSIS — H02849 Edema of unspecified eye, unspecified eyelid: Secondary | ICD-10-CM | POA: Diagnosis not present

## 2012-10-25 DIAGNOSIS — H40029 Open angle with borderline findings, high risk, unspecified eye: Secondary | ICD-10-CM | POA: Diagnosis not present

## 2012-10-25 NOTE — Patient Instructions (Signed)
Activities as tolerated. Please make sure you have an appointment to see Dr. Dierdre Forth.

## 2012-10-25 NOTE — Progress Notes (Signed)
Procedure:  Left quadriceps muscle biopsy  Date:  10/03/2012  Pathology:  No evidence of muscular necrosis or inflammatory change. Muscular atrophy noted.  History:  She is here for her first postoperative visit. She is still weak. She was tried on prednisone but this did not help her symptoms. Workup is continuing. She has some numbness around her left thigh incision.  Exam: General- Is in NAD. Left lower extremity-left thigh incision is clean and intact.  Assessment:  Left quadriceps muscle biopsy does not demonstrate polymyositis. Wound is healing well.  Plan:  I told her she needs to keep her appointment with Dr. Dierdre Forth. Activities as tolerated. Return when necessary.

## 2012-10-27 ENCOUNTER — Other Ambulatory Visit: Payer: Self-pay | Admitting: Internal Medicine

## 2012-10-27 DIAGNOSIS — R5383 Other fatigue: Secondary | ICD-10-CM | POA: Diagnosis not present

## 2012-10-27 DIAGNOSIS — M5412 Radiculopathy, cervical region: Secondary | ICD-10-CM | POA: Diagnosis not present

## 2012-10-27 DIAGNOSIS — M5382 Other specified dorsopathies, cervical region: Secondary | ICD-10-CM | POA: Diagnosis not present

## 2012-10-27 DIAGNOSIS — M5020 Other cervical disc displacement, unspecified cervical region: Secondary | ICD-10-CM | POA: Diagnosis not present

## 2012-10-27 DIAGNOSIS — R269 Unspecified abnormalities of gait and mobility: Secondary | ICD-10-CM | POA: Diagnosis not present

## 2012-10-27 DIAGNOSIS — M5030 Other cervical disc degeneration, unspecified cervical region: Secondary | ICD-10-CM | POA: Diagnosis not present

## 2012-10-27 DIAGNOSIS — M5410 Radiculopathy, site unspecified: Secondary | ICD-10-CM

## 2012-10-27 DIAGNOSIS — R5381 Other malaise: Secondary | ICD-10-CM | POA: Diagnosis not present

## 2012-10-27 DIAGNOSIS — R0602 Shortness of breath: Secondary | ICD-10-CM | POA: Diagnosis not present

## 2012-10-28 DIAGNOSIS — G959 Disease of spinal cord, unspecified: Secondary | ICD-10-CM | POA: Diagnosis not present

## 2012-10-28 DIAGNOSIS — R5383 Other fatigue: Secondary | ICD-10-CM | POA: Diagnosis not present

## 2012-10-29 ENCOUNTER — Other Ambulatory Visit: Payer: Medicare Other

## 2012-10-31 ENCOUNTER — Telehealth: Payer: Self-pay | Admitting: Nurse Practitioner

## 2012-10-31 DIAGNOSIS — G959 Disease of spinal cord, unspecified: Secondary | ICD-10-CM | POA: Diagnosis not present

## 2012-11-03 ENCOUNTER — Ambulatory Visit (INDEPENDENT_AMBULATORY_CARE_PROVIDER_SITE_OTHER): Payer: Medicare Other | Admitting: Diagnostic Neuroimaging

## 2012-11-03 ENCOUNTER — Encounter: Payer: Self-pay | Admitting: Diagnostic Neuroimaging

## 2012-11-03 VITALS — BP 122/65 | HR 63 | Ht 69.0 in | Wt 219.0 lb

## 2012-11-03 DIAGNOSIS — R292 Abnormal reflex: Secondary | ICD-10-CM

## 2012-11-03 DIAGNOSIS — R531 Weakness: Secondary | ICD-10-CM | POA: Insufficient documentation

## 2012-11-03 NOTE — Progress Notes (Signed)
[facility]  PATIENT: [name] DOB: 10-31-1945  REFERRING CLINICIAN: Christain Sacramento HISTORY FROM: patient and husband REASON FOR VISIT: new consult   HISTORICAL  CHIEF COMPLAINT:  Chief Complaint  Patient presents with  . Neurologic Problem    NP# 6    HISTORY OF PRESENT ILLNESS:   67 year old right-handed female here for evaluation of muscle weakness.  April 2014 patient began to feel tired and heaviness in her hips and thighs. She was evaluated by PCP and rheumatology and worked up for possible myopathy/polymyositis. CK level was mildly elevated. EMG and muscle biopsy were negative. Patient was treated empirically with prednisone without relief. By June, July 2014 she progressively worsened and developed tingling in her toes. Over the past 3 weeks she's developed weakness in her hands and arms. No bowel or bladder dysfunction. She's having trouble walking and falling down frequently. She's not using a cane or walker. No blurred vision, double vision, drooping eyelids, slurred speech, trouble swallowing or choking. No shortness of breath or chest pain.  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 Patient referred for further evaluation. She has a followup with neurosurgery next week.  REVIEW OF SYSTEMS: Full 14 system review of systems performed and notable only for numbness weakness snoring decreased energy fatigue rash.  ALLERGIES: No Known Allergies  HOME MEDICATIONS: Prior to Admission medications   Medication Sig Start Date End Date Taking? Authorizing Provider  fish oil-omega-3 fatty acids 1000 MG capsule Take 2 g by mouth daily.   Yes Historical Provider, MD  HYDROcodone-acetaminophen (NORCO) 5-325 MG per tablet Take 1-2 tablets by mouth every 4 (four) hours as needed for pain. 10/03/12  Yes Adolph Pollack, MD  Multiple  Vitamins-Minerals (CENTRUM SILVER ADULT 50+ PO) Take by mouth.   Yes Historical Provider, MD  sertraline (ZOLOFT) 100 MG tablet Take 100 mg by mouth daily.   Yes Historical Provider, MD   Outpatient Prescriptions Prior to Visit  Medication Sig Dispense Refill  . fish oil-omega-3 fatty acids 1000 MG capsule Take 2 g by mouth daily.      Marland Kitchen HYDROcodone-acetaminophen (NORCO) 5-325 MG per tablet Take 1-2 tablets by mouth every 4 (four) hours as needed for pain.  30 tablet  0  . Multiple Vitamins-Minerals (CENTRUM SILVER ADULT 50+ PO) Take by mouth.      . sertraline (ZOLOFT) 100 MG tablet Take 100 mg by mouth daily.       No facility-administered medications prior to visit.    PAST MEDICAL HISTORY: Past Medical History  Diagnosis Date  . Arthritis     PAST SURGICAL HISTORY: Past Surgical History  Procedure Laterality Date  . Neck surgery  2010    cerv disc fused   . Colonoscopy    . Bunionectomy  2013    rt foot  . Abdominal hysterectomy  1995  . Muscle biopsy Left 10/03/2012    Procedure: LEFT QUADRICEP MUSCLE BIOPSY;  Surgeon: Adolph Pollack, MD;  Location: [facility];  Service: General;  Laterality: Left;    FAMILY HISTORY: Family History  Problem Relation Age of Onset  . Hypertension Mother   . Cancer Sister     SOCIAL HISTORY:  History   Social History  . Marital Status: Married    Spouse Name: N/A    Number of Children: N/A  . Years of Education: N/A  Occupational History  . Not on file.   Social History Main Topics  . Smoking status: Former Smoker    Quit date: 03/23/2008  . Smokeless tobacco: Never Used  . Alcohol Use: No  . Drug Use: No  . Sexual Activity: Not on file   Other Topics Concern  . Not on file   Social History Narrative  . No narrative on file     PHYSICAL EXAM  Filed Vitals:   11/03/12 1138  BP: 122/65  Pulse: 63  Height: 5\' 9"  (1.753 m)  Weight: 219 lb (99.338 kg)    Not recorded    Body mass index is  32.33 kg/(m^2).  GENERAL EXAM: Patient is in no distress  CARDIOVASCULAR: Regular rate and rhythm, no murmurs, no carotid bruits  NEUROLOGIC: MENTAL STATUS: awake, alert, language fluent, comprehension intact, naming intact CRANIAL NERVE: no papilledema on fundoscopic exam, pupils equal and reactive to light, visual fields full to confrontation, extraocular muscles intact, no nystagmus, facial sensation and strength symmetric, uvula midline, shoulder shrug symmetric, tongue midline. MOTOR: normal bulk; INCREASED TONE IN BLE (L>R). BUE (DELTOID 3, BICEPS 4, TRICEP 3, WE 4, WF 3, GRIP 4, FINGER ABDUCTION 4+), BLE (HF 3, KE 3, KF 3, DF 4).  SENSORY: DECR VIB AT TOES (RIGHT 6 SEC, LEFT 8 SEC). NORMAL PROPRIO. INTACT LT.  COORDINATION: finger-nose-finger, fine finger movements normal REFLEXES: BUE 3, KNEES 3, ANKLES 2. POSITIVE HOFFMANS, SUPRAPATELLAR AND CROSSED ADDUCTORS.DOWN GOING TOES. GAIT/STATION: SLOW, CAUTIOUS GAIT. UNSTEADY. WADDLING GAIT.   DIAGNOSTIC DATA (LABS, IMAGING, TESTING) - I reviewed patient records, labs, notes, testing and imaging myself where available.  Lab Results  Component Value Date   WBC 6.1 09/30/2012   HGB 14.0 09/30/2012   HCT 41.1 09/30/2012   MCV 87.1 09/30/2012   PLT 213 09/30/2012      Component Value Date/Time   NA 138 09/30/2012 1500   K 4.1 09/30/2012 1500   CL 103 09/30/2012 1500   CO2 28 09/30/2012 1500   GLUCOSE 95 09/30/2012 1500   BUN 12 09/30/2012 1500   CREATININE 0.98 09/30/2012 1500   CALCIUM 9.2 09/30/2012 1500   PROT 6.9 09/30/2012 1500   ALBUMIN 3.8 09/30/2012 1500   AST 22 09/30/2012 1500   ALT 17 09/30/2012 1500   ALKPHOS 84 09/30/2012 1500   BILITOT 0.4 09/30/2012 1500   GFRNONAA 59* 09/30/2012 1500   GFRAA 68* 09/30/2012 1500   No results found for this basename: CHOL, HDL, LDLCALC, LDLDIRECT, TRIG, CHOLHDL   No results found for this basename: HGBA1C   No results found for this basename: VITAMINB12   No results found for this  basename: TSH    06/22/12 MRI BRAIN - few non-specific periventricular and subcortical foci of gliosis  10/27/12 MRI CERVICAL - 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 ACDF C5-C7.   ASSESSMENT AND PLAN  67 y.o. year old female here with progressive lower extremity weakness, now in the upper extremities. She has some hyperreflexia. C3-4 spinal cord lesion noted. Presence of brain and spine lesions raises possibility of an autoimmune, inflammatory process such as multiple sclerosis. I will pursue further workup with repeat MRI brain with and without, MRI thoracic spine, lumbar puncture and lab testing. We may consider high-dose IV steroids. She may need malignancy screening. Encouraged patient to use a cane or walker in the next few days and weeks.  I reviewed prior imaging, studies and labs, records, reviewed my findings with the patient  and other healthcare providers in the management of his high-risk medical condition (diffuse muscle weakness and hyperreflexia and spinal cord lesion) requiring high complexity medical decision-making.  Orders Placed This Encounter  Procedures  . MR Brain W Wo Contrast  . MR Thoracic Spine W Wo Contrast  . DG FLUORO GUIDE LUMBAR PUNCTURE    Return in about 3 weeks (around 11/24/2012) for overbook ok.    Suanne Marker, MD 11/03/2012, 12:46 PM Certified in Neurology, Neurophysiology and Neuroimaging  [facility] [address] [place], [place] [address] [phone]

## 2012-11-03 NOTE — Patient Instructions (Signed)
I will check MRIs and lumbar puncture and labs.  Use a walker.

## 2012-11-04 ENCOUNTER — Ambulatory Visit: Payer: Medicare Other | Admitting: Diagnostic Neuroimaging

## 2012-11-04 ENCOUNTER — Ambulatory Visit: Payer: Medicare Other | Admitting: Nurse Practitioner

## 2012-11-04 DIAGNOSIS — L259 Unspecified contact dermatitis, unspecified cause: Secondary | ICD-10-CM | POA: Diagnosis not present

## 2012-11-04 DIAGNOSIS — M4712 Other spondylosis with myelopathy, cervical region: Secondary | ICD-10-CM | POA: Diagnosis not present

## 2012-11-11 ENCOUNTER — Other Ambulatory Visit: Payer: Self-pay | Admitting: Internal Medicine

## 2012-11-11 DIAGNOSIS — R531 Weakness: Secondary | ICD-10-CM

## 2012-11-11 DIAGNOSIS — R109 Unspecified abdominal pain: Secondary | ICD-10-CM

## 2012-11-11 DIAGNOSIS — G9589 Other specified diseases of spinal cord: Secondary | ICD-10-CM

## 2012-11-11 DIAGNOSIS — C801 Malignant (primary) neoplasm, unspecified: Secondary | ICD-10-CM

## 2012-11-12 ENCOUNTER — Ambulatory Visit
Admission: RE | Admit: 2012-11-12 | Discharge: 2012-11-12 | Disposition: A | Discharge status: Home / Self Care | Payer: Medicare Other | Source: Ambulatory Visit | Attending: Internal Medicine | Admitting: Internal Medicine

## 2012-11-12 ENCOUNTER — Ambulatory Visit
Admission: RE | Admit: 2012-11-12 | Discharge: 2012-11-12 | Disposition: A | Discharge status: Home / Self Care | Payer: Medicare Other | Source: Ambulatory Visit | Attending: Diagnostic Neuroimaging | Admitting: Diagnostic Neuroimaging

## 2012-11-12 ENCOUNTER — Other Ambulatory Visit: Payer: Medicare Other

## 2012-11-12 DIAGNOSIS — R531 Weakness: Secondary | ICD-10-CM

## 2012-11-12 DIAGNOSIS — M4802 Spinal stenosis, cervical region: Secondary | ICD-10-CM | POA: Diagnosis not present

## 2012-11-12 DIAGNOSIS — G35 Multiple sclerosis: Secondary | ICD-10-CM

## 2012-11-12 DIAGNOSIS — M5410 Radiculopathy, site unspecified: Secondary | ICD-10-CM

## 2012-11-12 DIAGNOSIS — R5381 Other malaise: Secondary | ICD-10-CM | POA: Diagnosis not present

## 2012-11-12 MED ORDER — GADOBENATE DIMEGLUMINE 529 MG/ML IV SOLN
20.0000 mL | Freq: Once | INTRAVENOUS | Status: AC | PRN
  Administered 2012-11-12: 20 mL via INTRAVENOUS

## 2012-11-14 ENCOUNTER — Ambulatory Visit
Admission: RE | Admit: 2012-11-14 | Discharge: 2012-11-14 | Disposition: A | Discharge status: Home / Self Care | Payer: Medicare Other | Source: Ambulatory Visit | Attending: Internal Medicine | Admitting: Internal Medicine

## 2012-11-14 ENCOUNTER — Other Ambulatory Visit: Payer: Medicare Other

## 2012-11-14 DIAGNOSIS — R109 Unspecified abdominal pain: Secondary | ICD-10-CM

## 2012-11-14 DIAGNOSIS — I319 Disease of pericardium, unspecified: Secondary | ICD-10-CM | POA: Diagnosis not present

## 2012-11-14 DIAGNOSIS — R531 Weakness: Secondary | ICD-10-CM

## 2012-11-14 DIAGNOSIS — C801 Malignant (primary) neoplasm, unspecified: Secondary | ICD-10-CM

## 2012-11-14 MED ORDER — IOHEXOL 300 MG/ML  SOLN
125.0000 mL | Freq: Once | INTRAMUSCULAR | Status: AC | PRN
  Administered 2012-11-14: 125 mL via INTRAVENOUS

## 2012-11-15 ENCOUNTER — Other Ambulatory Visit: Payer: Medicare Other

## 2012-11-15 ENCOUNTER — Ambulatory Visit
Admission: RE | Admit: 2012-11-15 | Discharge: 2012-11-15 | Disposition: A | Discharge status: Home / Self Care | Payer: Medicare Other | Source: Ambulatory Visit | Attending: Diagnostic Neuroimaging | Admitting: Diagnostic Neuroimaging

## 2012-11-15 VITALS — BP 112/45 | HR 51

## 2012-11-15 DIAGNOSIS — M539 Dorsopathy, unspecified: Secondary | ICD-10-CM | POA: Diagnosis not present

## 2012-11-15 DIAGNOSIS — R5381 Other malaise: Secondary | ICD-10-CM | POA: Diagnosis not present

## 2012-11-15 DIAGNOSIS — R531 Weakness: Secondary | ICD-10-CM

## 2012-11-15 LAB — CSF CELL COUNT WITH DIFFERENTIAL
RBC Count, CSF: 395 cu mm — ABNORMAL HIGH
Tube #: 4
WBC, CSF: 1 cu mm (ref 0–5)

## 2012-11-15 LAB — ANGIOTENSIN CONVERTING ENZYME: Angiotensin-Converting Enzyme: 51 U/L (ref 8–52)

## 2012-11-15 NOTE — Progress Notes (Signed)
Discharge instructions explained at length to pt and her husband.

## 2012-11-15 NOTE — Progress Notes (Signed)
Blood drawn from right AC, 4 vials collected to go with spinal fluid. Site is unremarkable, pt tolerated procedure well.

## 2012-11-16 LAB — ANA: Anti Nuclear Antibody(ANA): NEGATIVE

## 2012-11-16 LAB — SJOGREN'S SYNDROME ANTIBODS(SSA + SSB)
SSA (Ro) (ENA) Antibody, IgG: 2 AU/mL (ref <30)
SSB (La) (ENA) Antibody, IgG: 9 AU/mL (ref <30)

## 2012-11-16 LAB — ANGIOTENSIN CONVERTING ENZYME, CSF: ACE, CSF: 7 U/L (ref <=15)

## 2012-11-16 LAB — ANCA TITERS

## 2012-11-17 DIAGNOSIS — M4712 Other spondylosis with myelopathy, cervical region: Secondary | ICD-10-CM | POA: Diagnosis not present

## 2012-11-17 DIAGNOSIS — T84498A Other mechanical complication of other internal orthopedic devices, implants and grafts, initial encounter: Secondary | ICD-10-CM | POA: Diagnosis not present

## 2012-11-17 DIAGNOSIS — G959 Disease of spinal cord, unspecified: Secondary | ICD-10-CM | POA: Diagnosis not present

## 2012-11-17 LAB — LYME DISEASE DNA BY PCR(BORRELIA BURG): B burgdorferi DNA: NOT DETECTED

## 2012-11-17 LAB — HEPATITIS PANEL, ACUTE
Hep B C IgM: NEGATIVE
Hepatitis B Surface Ag: NEGATIVE

## 2012-11-18 LAB — CSF CULTURE W GRAM STAIN
Gram Stain: NONE SEEN
Organism ID, Bacteria: NO GROWTH

## 2012-11-18 LAB — HSV(HERPES SMPLX VRS)ABS-I+II(IGG)-CSF

## 2012-11-29 ENCOUNTER — Telehealth: Payer: Self-pay | Admitting: Diagnostic Neuroimaging

## 2012-12-06 ENCOUNTER — Ambulatory Visit (INDEPENDENT_AMBULATORY_CARE_PROVIDER_SITE_OTHER): Payer: Medicare Other | Admitting: Diagnostic Neuroimaging

## 2012-12-06 ENCOUNTER — Encounter: Payer: Self-pay | Admitting: Diagnostic Neuroimaging

## 2012-12-06 ENCOUNTER — Telehealth: Payer: Self-pay | Admitting: Diagnostic Neuroimaging

## 2012-12-06 VITALS — BP 131/69 | HR 66 | Temp 98.3°F | Ht 68.0 in | Wt 219.0 lb

## 2012-12-06 DIAGNOSIS — R5381 Other malaise: Secondary | ICD-10-CM

## 2012-12-06 DIAGNOSIS — R531 Weakness: Secondary | ICD-10-CM

## 2012-12-06 DIAGNOSIS — R292 Abnormal reflex: Secondary | ICD-10-CM | POA: Diagnosis not present

## 2012-12-06 NOTE — Progress Notes (Signed)
[facility]  PATIENT: [name] DOB: November 17, 1945  REFERRING CLINICIAN: Christain Sacramento HISTORY FROM: patient and husband REASON FOR VISIT: new consult   HISTORICAL  CHIEF COMPLAINT:  Chief Complaint  Patient presents with  . Follow-up    3 wk per instructions    HISTORY OF PRESENT ILLNESS:   UPDATE 12/05/12: Since last visit, had MRI brain and t-spine, LP, and lab testing, which was reviewed with patient and family. Patient feels arms and legs are getting weaker and weaker. Having to hold on to the wall. Worried about falling down.  PRIOR HPI (11/03/12): 67 year old right-handed female here for evaluation of muscle weakness.  April 2014 patient began to feel tired and heaviness in her hips and thighs. She was evaluated by PCP and rheumatology and worked up for possible myopathy/polymyositis. CK level was mildly elevated. EMG and muscle biopsy were negative. Patient was treated empirically with prednisone without relief. By June, July 2014 she progressively worsened and developed tingling in her toes. Over the past 3 weeks she's developed weakness in her hands and arms. No bowel or bladder dysfunction. She's having trouble walking and falling down frequently. She's not using a cane or walker. No blurred vision, double vision, drooping eyelids, slurred speech, trouble swallowing or choking. No shortness of breath or chest pain.  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 Patient referred for further evaluation. She has a followup with neurosurgery next week.  REVIEW OF SYSTEMS: Full 14 system review of systems performed and notable only for numbness weakness snoring decreased energy fatigue rash.  ALLERGIES: Allergies  Allergen Reactions  . Gadolinium Derivatives Swelling and Rash    Patient reported having rash on face and swelling of eyes the day  after administration of MRI contrast on two occasions.      HOME MEDICATIONS: Prior to Admission medications   Medication Sig Start Date End Date Taking? Authorizing Provider  fish oil-omega-3 fatty acids 1000 MG capsule Take 2 g by mouth daily.   Yes Historical Provider, MD  HYDROcodone-acetaminophen (NORCO) 5-325 MG per tablet Take 1-2 tablets by mouth every 4 (four) hours as needed for pain. 10/03/12  Yes Adolph Pollack, MD  Multiple Vitamins-Minerals (CENTRUM SILVER ADULT 50+ PO) Take by mouth.   Yes Historical Provider, MD  sertraline (ZOLOFT) 100 MG tablet Take 100 mg by mouth daily.   Yes Historical Provider, MD   Outpatient Prescriptions Prior to Visit  Medication Sig Dispense Refill  . fish oil-omega-3 fatty acids 1000 MG capsule Take 2 g by mouth daily.      Marland Kitchen HYDROcodone-acetaminophen (NORCO) 5-325 MG per tablet Take 1-2 tablets by mouth every 4 (four) hours as needed for pain.  30 tablet  0  . Multiple Vitamins-Minerals (CENTRUM SILVER ADULT 50+ PO) Take by mouth.      . sertraline (ZOLOFT) 100 MG tablet Take 100 mg by mouth daily.       No facility-administered medications prior to visit.    PAST MEDICAL HISTORY: Past Medical History  Diagnosis Date  . Arthritis     PAST SURGICAL HISTORY: Past Surgical History  Procedure Laterality Date  . Neck surgery  2010    cerv disc fused   . Colonoscopy    . Bunionectomy  2013    rt foot  . Abdominal hysterectomy  1995  . Muscle biopsy Left 10/03/2012    Procedure:  LEFT QUADRICEP MUSCLE BIOPSY;  Surgeon: Adolph Pollack, MD;  Location: [facility];  Service: General;  Laterality: Left;    FAMILY HISTORY: Family History  Problem Relation Age of Onset  . Hypertension Mother   . Cancer Sister     SOCIAL HISTORY:  History   Social History  . Marital Status: Married    Spouse Name: N/A    Number of Children: N/A  . Years of Education: N/A   Occupational History  . Not on file.   Social History  Main Topics  . Smoking status: Former Smoker    Quit date: 03/23/2008  . Smokeless tobacco: Never Used  . Alcohol Use: No  . Drug Use: No  . Sexual Activity: Not on file   Other Topics Concern  . Not on file   Social History Narrative  . No narrative on file     PHYSICAL EXAM  Filed Vitals:   12/06/12 1415  BP: 131/69  Pulse: 66  Temp: 98.3 F (36.8 C)  TempSrc: Oral  Height: 5\' 8"  (1.727 m)  Weight: 219 lb (99.338 kg)    Not recorded    Body mass index is 33.31 kg/(m^2).  GENERAL EXAM: Patient is in no distress  CARDIOVASCULAR: Regular rate and rhythm, no murmurs, no carotid bruits  NEUROLOGIC: MENTAL STATUS: awake, alert, language fluent, comprehension intact, naming intact CRANIAL NERVE: no papilledema on fundoscopic exam, pupils equal and reactive to light, visual fields full to confrontation, extraocular muscles intact, no nystagmus, facial sensation and strength symmetric, uvula midline, shoulder shrug symmetric, tongue midline. MOTOR: normal bulk; INCREASED TONE IN BLE (L>R). BUE (DELTOID 3, BICEPS 4, TRICEP 3, WE 4, WF 3, GRIP 4, FINGER ABDUCTION 4+), BLE (HF 3, KE 3, KF 3, DF 4).  SENSORY: DECR VIB AT TOES (RIGHT 6 SEC, LEFT 8 SEC). NORMAL PROPRIO. INTACT LT.  COORDINATION: finger-nose-finger, fine finger movements normal REFLEXES: BUE 3, KNEES 3, ANKLES 2. POSITIVE HOFFMANS, SUPRAPATELLAR AND CROSSED ADDUCTORS.DOWN GOING TOES. GAIT/STATION: SLOW, CAUTIOUS GAIT. UNSTEADY. WADDLING GAIT.   DIAGNOSTIC DATA (LABS, IMAGING, TESTING) - I reviewed patient records, labs, notes, testing and imaging myself where available.  Lab Results  Component Value Date   WBC 6.1 09/30/2012   HGB 14.0 09/30/2012   HCT 41.1 09/30/2012   MCV 87.1 09/30/2012   PLT 213 09/30/2012      Component Value Date/Time   NA 138 09/30/2012 1500   K 4.1 09/30/2012 1500   CL 103 09/30/2012 1500   CO2 28 09/30/2012 1500   GLUCOSE 95 09/30/2012 1500   BUN 12 09/30/2012 1500   CREATININE  0.98 09/30/2012 1500   CALCIUM 9.2 09/30/2012 1500   PROT 6.9 09/30/2012 1500   ALBUMIN 3.8 09/30/2012 1500   AST 22 09/30/2012 1500   ALT 17 09/30/2012 1500   ALKPHOS 84 09/30/2012 1500   BILITOT 0.4 09/30/2012 1500   GFRNONAA 59* 09/30/2012 1500   GFRAA 68* 09/30/2012 1500   No results found for this basename: CHOL,  HDL,  LDLCALC,  LDLDIRECT,  TRIG,  CHOLHDL   No results found for this basename: HGBA1C   No results found for this basename: VITAMINB12   No results found for this basename: TSH    06/22/12 MRI BRAIN - few non-specific periventricular and subcortical foci of gliosis  10/03/12 left quadriceps biopsy - VERY MILD NONSPECIFIC MYOFIBER ATROPHY IN SKELETAL MUSCLE SAMPLE WITHOUT MYOFIBER NECROSIS OR INFLAMMATION  10/27/12 MRI CERVICAL - C3-4 cord lesion on the right (autoimmune,  inflamm, neoplasm); no enhancement (10/28/12). ACDF C5-C7.  11/15/12 MRI thoracic spine - spondylitic change at T11-12 with broad-based disc osteophyte protrusion and facet hypertrophy resulting in mild cord compression and subtle cord signal abnormality and bilateral foraminal stenosis. No definite demyelinating lesions are noted.  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 Overall no significant change compared with the MRI dated 06/22/2012  CSF studies: WBC 1, RBC 395, protein 69 (h), glucose 52, oligoclonal bands negative, HSV neg, ACE 7, gram stain and culture neg.  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 year old female here with progressive lower extremity weakness, now in the upper extremities. She has some hyperreflexia. 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 MRI thoracic spine shows at T11-12 level,  disc bulging and ligamentum flavum hypertrophy with mild spinal stenosis and myelomalacia adjacent within the spinal cord.  CSF analysis shows oligoclonal bands are negative. CSF protein slightly elevated, but non-specific. p-ANCA slightly positive, and recommend follow up with rheumatology, though it may be false positive.  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 I think a primary CNS inflammatory process is less likely.  PLAN: 1. Follow up with [facility] neurosurgery 2. Second opinion at [facility] neurology 3. Encouraged patient to use a cane or walker in the next few days and weeks  I reviewed prior imaging, studies and labs, records, reviewed my findings with the patient and other healthcare providers in the management of his high-risk medical condition (diffuse muscle weakness and hyperreflexia and spinal cord lesion) requiring high complexity medical decision-making.  Return in about 3 months (around 03/07/2013).    Suanne Marker, MD 12/06/2012, 3:15 PM Certified in Neurology, Neurophysiology and Neuroimaging  [facility] [address] [place], [place] [address] [phone]

## 2012-12-08 DIAGNOSIS — M4712 Other spondylosis with myelopathy, cervical region: Secondary | ICD-10-CM | POA: Diagnosis not present

## 2012-12-08 DIAGNOSIS — G959 Disease of spinal cord, unspecified: Secondary | ICD-10-CM | POA: Diagnosis not present

## 2012-12-08 DIAGNOSIS — R209 Unspecified disturbances of skin sensation: Secondary | ICD-10-CM | POA: Diagnosis not present

## 2012-12-08 DIAGNOSIS — R29898 Other symptoms and signs involving the musculoskeletal system: Secondary | ICD-10-CM | POA: Diagnosis not present

## 2012-12-08 DIAGNOSIS — Z5189 Encounter for other specified aftercare: Secondary | ICD-10-CM | POA: Diagnosis not present

## 2012-12-13 DIAGNOSIS — M5030 Other cervical disc degeneration, unspecified cervical region: Secondary | ICD-10-CM | POA: Diagnosis not present

## 2012-12-13 DIAGNOSIS — G959 Disease of spinal cord, unspecified: Secondary | ICD-10-CM | POA: Diagnosis not present

## 2012-12-14 DIAGNOSIS — M4712 Other spondylosis with myelopathy, cervical region: Secondary | ICD-10-CM | POA: Diagnosis not present

## 2012-12-14 DIAGNOSIS — F39 Unspecified mood [affective] disorder: Secondary | ICD-10-CM | POA: Diagnosis not present

## 2012-12-21 DIAGNOSIS — M4712 Other spondylosis with myelopathy, cervical region: Secondary | ICD-10-CM | POA: Diagnosis not present

## 2012-12-21 DIAGNOSIS — R269 Unspecified abnormalities of gait and mobility: Secondary | ICD-10-CM | POA: Diagnosis not present

## 2012-12-21 DIAGNOSIS — IMO0001 Reserved for inherently not codable concepts without codable children: Secondary | ICD-10-CM | POA: Diagnosis not present

## 2012-12-21 DIAGNOSIS — M6281 Muscle weakness (generalized): Secondary | ICD-10-CM | POA: Diagnosis not present

## 2012-12-21 DIAGNOSIS — M545 Low back pain: Secondary | ICD-10-CM | POA: Diagnosis not present

## 2012-12-27 DIAGNOSIS — M545 Low back pain: Secondary | ICD-10-CM | POA: Diagnosis not present

## 2012-12-27 DIAGNOSIS — IMO0001 Reserved for inherently not codable concepts without codable children: Secondary | ICD-10-CM | POA: Diagnosis not present

## 2012-12-27 DIAGNOSIS — M4712 Other spondylosis with myelopathy, cervical region: Secondary | ICD-10-CM | POA: Diagnosis not present

## 2012-12-27 DIAGNOSIS — M6281 Muscle weakness (generalized): Secondary | ICD-10-CM | POA: Diagnosis not present

## 2012-12-27 DIAGNOSIS — R269 Unspecified abnormalities of gait and mobility: Secondary | ICD-10-CM | POA: Diagnosis not present

## 2012-12-28 DIAGNOSIS — H43819 Vitreous degeneration, unspecified eye: Secondary | ICD-10-CM | POA: Diagnosis not present

## 2012-12-28 DIAGNOSIS — H2510 Age-related nuclear cataract, unspecified eye: Secondary | ICD-10-CM | POA: Diagnosis not present

## 2012-12-28 DIAGNOSIS — F329 Major depressive disorder, single episode, unspecified: Secondary | ICD-10-CM | POA: Diagnosis not present

## 2012-12-28 DIAGNOSIS — H40029 Open angle with borderline findings, high risk, unspecified eye: Secondary | ICD-10-CM | POA: Diagnosis not present

## 2012-12-28 DIAGNOSIS — IMO0001 Reserved for inherently not codable concepts without codable children: Secondary | ICD-10-CM | POA: Diagnosis not present

## 2012-12-28 DIAGNOSIS — M4712 Other spondylosis with myelopathy, cervical region: Secondary | ICD-10-CM | POA: Diagnosis not present

## 2012-12-28 DIAGNOSIS — H04129 Dry eye syndrome of unspecified lacrimal gland: Secondary | ICD-10-CM | POA: Diagnosis not present

## 2012-12-28 DIAGNOSIS — H1045 Other chronic allergic conjunctivitis: Secondary | ICD-10-CM | POA: Diagnosis not present

## 2012-12-29 DIAGNOSIS — M6281 Muscle weakness (generalized): Secondary | ICD-10-CM | POA: Diagnosis not present

## 2012-12-29 DIAGNOSIS — M545 Low back pain: Secondary | ICD-10-CM | POA: Diagnosis not present

## 2012-12-29 DIAGNOSIS — M4712 Other spondylosis with myelopathy, cervical region: Secondary | ICD-10-CM | POA: Diagnosis not present

## 2012-12-29 DIAGNOSIS — R269 Unspecified abnormalities of gait and mobility: Secondary | ICD-10-CM | POA: Diagnosis not present

## 2012-12-29 DIAGNOSIS — IMO0001 Reserved for inherently not codable concepts without codable children: Secondary | ICD-10-CM | POA: Diagnosis not present

## 2013-01-03 DIAGNOSIS — M6281 Muscle weakness (generalized): Secondary | ICD-10-CM | POA: Diagnosis not present

## 2013-01-03 DIAGNOSIS — M4712 Other spondylosis with myelopathy, cervical region: Secondary | ICD-10-CM | POA: Diagnosis not present

## 2013-01-03 DIAGNOSIS — R269 Unspecified abnormalities of gait and mobility: Secondary | ICD-10-CM | POA: Diagnosis not present

## 2013-01-03 DIAGNOSIS — M545 Low back pain: Secondary | ICD-10-CM | POA: Diagnosis not present

## 2013-01-03 DIAGNOSIS — IMO0001 Reserved for inherently not codable concepts without codable children: Secondary | ICD-10-CM | POA: Diagnosis not present

## 2013-01-04 DIAGNOSIS — M545 Low back pain: Secondary | ICD-10-CM | POA: Diagnosis not present

## 2013-01-04 DIAGNOSIS — M6281 Muscle weakness (generalized): Secondary | ICD-10-CM | POA: Diagnosis not present

## 2013-01-04 DIAGNOSIS — M4712 Other spondylosis with myelopathy, cervical region: Secondary | ICD-10-CM | POA: Diagnosis not present

## 2013-01-04 DIAGNOSIS — IMO0001 Reserved for inherently not codable concepts without codable children: Secondary | ICD-10-CM | POA: Diagnosis not present

## 2013-01-04 DIAGNOSIS — R269 Unspecified abnormalities of gait and mobility: Secondary | ICD-10-CM | POA: Diagnosis not present

## 2013-01-05 DIAGNOSIS — M545 Low back pain: Secondary | ICD-10-CM | POA: Diagnosis not present

## 2013-01-05 DIAGNOSIS — M4712 Other spondylosis with myelopathy, cervical region: Secondary | ICD-10-CM | POA: Diagnosis not present

## 2013-01-05 DIAGNOSIS — IMO0001 Reserved for inherently not codable concepts without codable children: Secondary | ICD-10-CM | POA: Diagnosis not present

## 2013-01-05 DIAGNOSIS — R269 Unspecified abnormalities of gait and mobility: Secondary | ICD-10-CM | POA: Diagnosis not present

## 2013-01-05 DIAGNOSIS — M6281 Muscle weakness (generalized): Secondary | ICD-10-CM | POA: Diagnosis not present

## 2013-01-10 DIAGNOSIS — IMO0001 Reserved for inherently not codable concepts without codable children: Secondary | ICD-10-CM | POA: Diagnosis not present

## 2013-01-10 DIAGNOSIS — M6281 Muscle weakness (generalized): Secondary | ICD-10-CM | POA: Diagnosis not present

## 2013-01-10 DIAGNOSIS — R5381 Other malaise: Secondary | ICD-10-CM | POA: Diagnosis not present

## 2013-01-10 DIAGNOSIS — M545 Low back pain: Secondary | ICD-10-CM | POA: Diagnosis not present

## 2013-01-10 DIAGNOSIS — R269 Unspecified abnormalities of gait and mobility: Secondary | ICD-10-CM | POA: Diagnosis not present

## 2013-01-10 DIAGNOSIS — M4712 Other spondylosis with myelopathy, cervical region: Secondary | ICD-10-CM | POA: Diagnosis not present

## 2013-01-12 DIAGNOSIS — M6281 Muscle weakness (generalized): Secondary | ICD-10-CM | POA: Diagnosis not present

## 2013-01-12 DIAGNOSIS — IMO0001 Reserved for inherently not codable concepts without codable children: Secondary | ICD-10-CM | POA: Diagnosis not present

## 2013-01-12 DIAGNOSIS — M545 Low back pain: Secondary | ICD-10-CM | POA: Diagnosis not present

## 2013-01-12 DIAGNOSIS — R269 Unspecified abnormalities of gait and mobility: Secondary | ICD-10-CM | POA: Diagnosis not present

## 2013-01-12 DIAGNOSIS — M4712 Other spondylosis with myelopathy, cervical region: Secondary | ICD-10-CM | POA: Diagnosis not present

## 2013-01-17 DIAGNOSIS — M6281 Muscle weakness (generalized): Secondary | ICD-10-CM | POA: Diagnosis not present

## 2013-01-17 DIAGNOSIS — R269 Unspecified abnormalities of gait and mobility: Secondary | ICD-10-CM | POA: Diagnosis not present

## 2013-01-17 DIAGNOSIS — M4712 Other spondylosis with myelopathy, cervical region: Secondary | ICD-10-CM | POA: Diagnosis not present

## 2013-01-17 DIAGNOSIS — M545 Low back pain: Secondary | ICD-10-CM | POA: Diagnosis not present

## 2013-01-17 DIAGNOSIS — IMO0001 Reserved for inherently not codable concepts without codable children: Secondary | ICD-10-CM | POA: Diagnosis not present

## 2013-01-19 DIAGNOSIS — M5412 Radiculopathy, cervical region: Secondary | ICD-10-CM | POA: Diagnosis not present

## 2013-01-19 DIAGNOSIS — R29898 Other symptoms and signs involving the musculoskeletal system: Secondary | ICD-10-CM | POA: Diagnosis not present

## 2013-01-19 DIAGNOSIS — M542 Cervicalgia: Secondary | ICD-10-CM | POA: Diagnosis not present

## 2013-01-24 DIAGNOSIS — M545 Low back pain: Secondary | ICD-10-CM | POA: Diagnosis not present

## 2013-01-24 DIAGNOSIS — M6281 Muscle weakness (generalized): Secondary | ICD-10-CM | POA: Diagnosis not present

## 2013-01-24 DIAGNOSIS — IMO0001 Reserved for inherently not codable concepts without codable children: Secondary | ICD-10-CM | POA: Diagnosis not present

## 2013-01-24 DIAGNOSIS — M4712 Other spondylosis with myelopathy, cervical region: Secondary | ICD-10-CM | POA: Diagnosis not present

## 2013-01-24 DIAGNOSIS — R269 Unspecified abnormalities of gait and mobility: Secondary | ICD-10-CM | POA: Diagnosis not present

## 2013-01-25 DIAGNOSIS — M4802 Spinal stenosis, cervical region: Secondary | ICD-10-CM | POA: Diagnosis not present

## 2013-01-25 DIAGNOSIS — M5412 Radiculopathy, cervical region: Secondary | ICD-10-CM | POA: Diagnosis not present

## 2013-01-25 DIAGNOSIS — Z981 Arthrodesis status: Secondary | ICD-10-CM | POA: Diagnosis not present

## 2013-01-25 DIAGNOSIS — M47817 Spondylosis without myelopathy or radiculopathy, lumbosacral region: Secondary | ICD-10-CM | POA: Diagnosis not present

## 2013-01-25 DIAGNOSIS — M948X9 Other specified disorders of cartilage, unspecified sites: Secondary | ICD-10-CM | POA: Diagnosis not present

## 2013-01-25 DIAGNOSIS — Q762 Congenital spondylolisthesis: Secondary | ICD-10-CM | POA: Diagnosis not present

## 2013-01-25 DIAGNOSIS — M47814 Spondylosis without myelopathy or radiculopathy, thoracic region: Secondary | ICD-10-CM | POA: Diagnosis not present

## 2013-01-25 DIAGNOSIS — T84498A Other mechanical complication of other internal orthopedic devices, implants and grafts, initial encounter: Secondary | ICD-10-CM | POA: Diagnosis not present

## 2013-01-25 DIAGNOSIS — E041 Nontoxic single thyroid nodule: Secondary | ICD-10-CM | POA: Diagnosis not present

## 2013-01-25 DIAGNOSIS — M4804 Spinal stenosis, thoracic region: Secondary | ICD-10-CM | POA: Diagnosis not present

## 2013-01-26 ENCOUNTER — Other Ambulatory Visit: Payer: Self-pay

## 2013-01-26 DIAGNOSIS — M4712 Other spondylosis with myelopathy, cervical region: Secondary | ICD-10-CM | POA: Diagnosis not present

## 2013-01-26 DIAGNOSIS — M6281 Muscle weakness (generalized): Secondary | ICD-10-CM | POA: Diagnosis not present

## 2013-01-26 DIAGNOSIS — R269 Unspecified abnormalities of gait and mobility: Secondary | ICD-10-CM | POA: Diagnosis not present

## 2013-01-26 DIAGNOSIS — IMO0001 Reserved for inherently not codable concepts without codable children: Secondary | ICD-10-CM | POA: Diagnosis not present

## 2013-01-26 DIAGNOSIS — M545 Low back pain: Secondary | ICD-10-CM | POA: Diagnosis not present

## 2013-01-31 DIAGNOSIS — IMO0001 Reserved for inherently not codable concepts without codable children: Secondary | ICD-10-CM | POA: Diagnosis not present

## 2013-01-31 DIAGNOSIS — R269 Unspecified abnormalities of gait and mobility: Secondary | ICD-10-CM | POA: Diagnosis not present

## 2013-01-31 DIAGNOSIS — M6281 Muscle weakness (generalized): Secondary | ICD-10-CM | POA: Diagnosis not present

## 2013-01-31 DIAGNOSIS — M4712 Other spondylosis with myelopathy, cervical region: Secondary | ICD-10-CM | POA: Diagnosis not present

## 2013-01-31 DIAGNOSIS — M545 Low back pain: Secondary | ICD-10-CM | POA: Diagnosis not present

## 2013-02-02 DIAGNOSIS — IMO0001 Reserved for inherently not codable concepts without codable children: Secondary | ICD-10-CM | POA: Diagnosis not present

## 2013-02-02 DIAGNOSIS — M4712 Other spondylosis with myelopathy, cervical region: Secondary | ICD-10-CM | POA: Diagnosis not present

## 2013-02-02 DIAGNOSIS — R269 Unspecified abnormalities of gait and mobility: Secondary | ICD-10-CM | POA: Diagnosis not present

## 2013-02-02 DIAGNOSIS — M545 Low back pain: Secondary | ICD-10-CM | POA: Diagnosis not present

## 2013-02-02 DIAGNOSIS — M6281 Muscle weakness (generalized): Secondary | ICD-10-CM | POA: Diagnosis not present

## 2013-02-21 DIAGNOSIS — M6281 Muscle weakness (generalized): Secondary | ICD-10-CM | POA: Diagnosis not present

## 2013-02-23 DIAGNOSIS — M4802 Spinal stenosis, cervical region: Secondary | ICD-10-CM | POA: Diagnosis not present

## 2013-02-24 DIAGNOSIS — F329 Major depressive disorder, single episode, unspecified: Secondary | ICD-10-CM | POA: Diagnosis not present

## 2013-02-24 DIAGNOSIS — F39 Unspecified mood [affective] disorder: Secondary | ICD-10-CM | POA: Diagnosis not present

## 2013-02-24 DIAGNOSIS — M4712 Other spondylosis with myelopathy, cervical region: Secondary | ICD-10-CM | POA: Diagnosis not present

## 2013-02-24 DIAGNOSIS — E041 Nontoxic single thyroid nodule: Secondary | ICD-10-CM | POA: Diagnosis not present

## 2013-02-27 ENCOUNTER — Other Ambulatory Visit: Payer: Self-pay | Admitting: Internal Medicine

## 2013-02-27 DIAGNOSIS — E041 Nontoxic single thyroid nodule: Secondary | ICD-10-CM

## 2013-02-28 DIAGNOSIS — M6281 Muscle weakness (generalized): Secondary | ICD-10-CM | POA: Diagnosis not present

## 2013-03-02 ENCOUNTER — Ambulatory Visit
Admission: RE | Admit: 2013-03-02 | Discharge: 2013-03-02 | Disposition: A | Discharge status: Home / Self Care | Payer: Medicare Other | Source: Ambulatory Visit | Attending: Internal Medicine | Admitting: Internal Medicine

## 2013-03-02 DIAGNOSIS — E042 Nontoxic multinodular goiter: Secondary | ICD-10-CM | POA: Diagnosis not present

## 2013-03-02 DIAGNOSIS — M6281 Muscle weakness (generalized): Secondary | ICD-10-CM | POA: Diagnosis not present

## 2013-03-02 DIAGNOSIS — E041 Nontoxic single thyroid nodule: Secondary | ICD-10-CM

## 2013-03-03 DIAGNOSIS — M6281 Muscle weakness (generalized): Secondary | ICD-10-CM | POA: Diagnosis not present

## 2013-03-10 DIAGNOSIS — M6281 Muscle weakness (generalized): Secondary | ICD-10-CM | POA: Diagnosis not present

## 2013-03-13 ENCOUNTER — Ambulatory Visit: Payer: Medicare Other | Admitting: Diagnostic Neuroimaging

## 2013-03-13 ENCOUNTER — Other Ambulatory Visit (HOSPITAL_COMMUNITY): Payer: Self-pay | Admitting: Internal Medicine

## 2013-03-13 DIAGNOSIS — E041 Nontoxic single thyroid nodule: Secondary | ICD-10-CM

## 2013-03-14 DIAGNOSIS — M6281 Muscle weakness (generalized): Secondary | ICD-10-CM | POA: Diagnosis not present

## 2013-03-27 DIAGNOSIS — Z01818 Encounter for other preprocedural examination: Secondary | ICD-10-CM | POA: Diagnosis not present

## 2013-03-29 DIAGNOSIS — M549 Dorsalgia, unspecified: Secondary | ICD-10-CM | POA: Diagnosis not present

## 2013-03-29 DIAGNOSIS — Z981 Arthrodesis status: Secondary | ICD-10-CM | POA: Diagnosis not present

## 2013-03-29 DIAGNOSIS — M4712 Other spondylosis with myelopathy, cervical region: Secondary | ICD-10-CM | POA: Diagnosis not present

## 2013-03-29 DIAGNOSIS — F411 Generalized anxiety disorder: Secondary | ICD-10-CM | POA: Diagnosis present

## 2013-03-29 DIAGNOSIS — T84498A Other mechanical complication of other internal orthopedic devices, implants and grafts, initial encounter: Secondary | ICD-10-CM | POA: Diagnosis present

## 2013-03-29 DIAGNOSIS — M4804 Spinal stenosis, thoracic region: Secondary | ICD-10-CM | POA: Diagnosis present

## 2013-03-29 DIAGNOSIS — K219 Gastro-esophageal reflux disease without esophagitis: Secondary | ICD-10-CM | POA: Diagnosis present

## 2013-03-29 DIAGNOSIS — M4802 Spinal stenosis, cervical region: Secondary | ICD-10-CM | POA: Diagnosis not present

## 2013-03-29 DIAGNOSIS — M4800 Spinal stenosis, site unspecified: Secondary | ICD-10-CM | POA: Diagnosis not present

## 2013-03-29 DIAGNOSIS — N3941 Urge incontinence: Secondary | ICD-10-CM | POA: Diagnosis present

## 2013-03-29 DIAGNOSIS — E041 Nontoxic single thyroid nodule: Secondary | ICD-10-CM | POA: Diagnosis present

## 2013-03-29 DIAGNOSIS — M47812 Spondylosis without myelopathy or radiculopathy, cervical region: Secondary | ICD-10-CM | POA: Diagnosis not present

## 2013-04-04 ENCOUNTER — Encounter (HOSPITAL_COMMUNITY): Payer: Medicare Other

## 2013-04-05 ENCOUNTER — Encounter (HOSPITAL_COMMUNITY): Payer: Medicare Other

## 2013-04-11 DIAGNOSIS — M4712 Other spondylosis with myelopathy, cervical region: Secondary | ICD-10-CM | POA: Diagnosis not present

## 2013-04-11 DIAGNOSIS — M6281 Muscle weakness (generalized): Secondary | ICD-10-CM | POA: Diagnosis not present

## 2013-04-11 DIAGNOSIS — M545 Low back pain, unspecified: Secondary | ICD-10-CM | POA: Diagnosis not present

## 2013-04-11 DIAGNOSIS — R269 Unspecified abnormalities of gait and mobility: Secondary | ICD-10-CM | POA: Diagnosis not present

## 2013-04-18 DIAGNOSIS — M542 Cervicalgia: Secondary | ICD-10-CM | POA: Diagnosis not present

## 2013-04-20 ENCOUNTER — Emergency Department (HOSPITAL_COMMUNITY)
Admission: EM | Admit: 2013-04-20 | Discharge: 2013-04-20 | Disposition: A | Discharge status: Home / Self Care | Payer: Medicare Other | Attending: Emergency Medicine | Admitting: Emergency Medicine

## 2013-04-20 ENCOUNTER — Emergency Department (HOSPITAL_COMMUNITY): Payer: Medicare Other

## 2013-04-20 ENCOUNTER — Encounter (HOSPITAL_COMMUNITY): Payer: Self-pay | Admitting: Emergency Medicine

## 2013-04-20 DIAGNOSIS — Z8669 Personal history of other diseases of the nervous system and sense organs: Secondary | ICD-10-CM | POA: Insufficient documentation

## 2013-04-20 DIAGNOSIS — Z87891 Personal history of nicotine dependence: Secondary | ICD-10-CM | POA: Insufficient documentation

## 2013-04-20 DIAGNOSIS — R531 Weakness: Secondary | ICD-10-CM

## 2013-04-20 DIAGNOSIS — Z79899 Other long term (current) drug therapy: Secondary | ICD-10-CM | POA: Diagnosis not present

## 2013-04-20 DIAGNOSIS — Z9889 Other specified postprocedural states: Secondary | ICD-10-CM | POA: Insufficient documentation

## 2013-04-20 DIAGNOSIS — M6281 Muscle weakness (generalized): Secondary | ICD-10-CM | POA: Diagnosis not present

## 2013-04-20 DIAGNOSIS — M129 Arthropathy, unspecified: Secondary | ICD-10-CM | POA: Diagnosis not present

## 2013-04-20 DIAGNOSIS — R404 Transient alteration of awareness: Secondary | ICD-10-CM | POA: Diagnosis not present

## 2013-04-20 DIAGNOSIS — J9819 Other pulmonary collapse: Secondary | ICD-10-CM | POA: Diagnosis not present

## 2013-04-20 DIAGNOSIS — R5383 Other fatigue: Principal | ICD-10-CM

## 2013-04-20 DIAGNOSIS — R5381 Other malaise: Secondary | ICD-10-CM | POA: Insufficient documentation

## 2013-04-20 LAB — COMPREHENSIVE METABOLIC PANEL
ALT: 11 U/L (ref 0–35)
AST: 13 U/L (ref 0–37)
Albumin: 3.2 g/dL — ABNORMAL LOW (ref 3.5–5.2)
Alkaline Phosphatase: 80 U/L (ref 39–117)
BILIRUBIN TOTAL: 0.5 mg/dL (ref 0.3–1.2)
BUN: 11 mg/dL (ref 6–23)
CALCIUM: 9.3 mg/dL (ref 8.4–10.5)
CHLORIDE: 98 meq/L (ref 96–112)
CO2: 25 meq/L (ref 19–32)
Creatinine, Ser: 0.85 mg/dL (ref 0.50–1.10)
GFR calc Af Amer: 81 mL/min — ABNORMAL LOW (ref >90)
GFR, EST NON AFRICAN AMERICAN: 70 mL/min — AB (ref >90)
Glucose, Bld: 122 mg/dL — ABNORMAL HIGH (ref 70–99)
Potassium: 4.3 mEq/L (ref 3.7–5.3)
SODIUM: 137 meq/L (ref 137–147)
Total Protein: 7.1 g/dL (ref 6.0–8.3)

## 2013-04-20 LAB — URINALYSIS, ROUTINE W REFLEX MICROSCOPIC
BILIRUBIN URINE: NEGATIVE
Glucose, UA: NEGATIVE mg/dL
Hgb urine dipstick: NEGATIVE
KETONES UR: NEGATIVE mg/dL
NITRITE: NEGATIVE
Protein, ur: NEGATIVE mg/dL
Specific Gravity, Urine: 1.011 (ref 1.005–1.030)
Urobilinogen, UA: 0.2 mg/dL (ref 0.0–1.0)
pH: 7 (ref 5.0–8.0)

## 2013-04-20 LAB — CBC
HCT: 36.3 % (ref 36.0–46.0)
Hemoglobin: 12.1 g/dL (ref 12.0–15.0)
MCH: 29.4 pg (ref 26.0–34.0)
MCHC: 33.3 g/dL (ref 30.0–36.0)
MCV: 88.3 fL (ref 78.0–100.0)
Platelets: 344 10*3/uL (ref 150–400)
RBC: 4.11 MIL/uL (ref 3.87–5.11)
RDW: 14.3 % (ref 11.5–15.5)
WBC: 10.4 10*3/uL (ref 4.0–10.5)

## 2013-04-20 LAB — URINE MICROSCOPIC-ADD ON

## 2013-04-20 LAB — T4, FREE: Free T4: 1.02 ng/dL (ref 0.80–1.80)

## 2013-04-20 LAB — TROPONIN I

## 2013-04-20 LAB — TSH: TSH: 3.06 u[IU]/mL (ref 0.350–4.500)

## 2013-04-20 MED ORDER — SODIUM CHLORIDE 0.9 % IV BOLUS (SEPSIS)
500.0000 mL | Freq: Once | INTRAVENOUS | Status: AC
  Administered 2013-04-20: 500 mL via INTRAVENOUS

## 2013-04-20 MED ORDER — ONDANSETRON HCL 4 MG/2ML IJ SOLN
4.0000 mg | Freq: Once | INTRAMUSCULAR | Status: AC
  Administered 2013-04-20: 4 mg via INTRAVENOUS
  Filled 2013-04-20: qty 2

## 2013-04-20 MED ORDER — HYDROMORPHONE HCL PF 1 MG/ML IJ SOLN
0.5000 mg | Freq: Once | INTRAMUSCULAR | Status: AC
  Administered 2013-04-20: 0.5 mg via INTRAVENOUS
  Filled 2013-04-20: qty 1

## 2013-04-20 NOTE — ED Provider Notes (Addendum)
CSN: [id number]     Arrival date & time 04/20/13  1008 History   First MD Initiated Contact with Patient 04/20/13 1030     Chief Complaint  Patient presents with  . Weakness   (Consider location/radiation/quality/duration/timing/severity/associated sxs/prior Treatment) Patient is a 68 y.o. female presenting with weakness. The history is provided by the patient and a relative.  Weakness Pertinent negatives include no chest pain, no abdominal pain, no headaches and no shortness of breath.  pt /o feeling generally weak for the past 3-4 days. 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  Pt states at surgery/admission at [facility] was told no spinal cord mass/tumor, no infection, and that abn mri and symptoms were due to 'arthritis' and spinal stenosis.  Pt states since surgery her muscular strength in arms and legs seems slightly better, but that in past 3-4 days her entire body generally feels weak, tired, and fatigued. No focal or unilateral numbness/weakness. No increase in pain or discomfort at surgical site. No drainage or redness to area. States since surgery has taken pain medication a few times per day, but that is largely for low back spasms. States hasnt tolerated any of her pain medication due to nausea/drowsinesss, so is only taking 1/2 hydrocodone at a time currently. Denies other recent med change except to state not taking the cymbalta she previously was taking. Pt denies fever or chills. No headache. No cough or uri c/o. No chest pain. No sob. No abd pain. Nausea. No vomiting or diarrhea. No dysuria or gu c/o.      Past Medical History  Diagnosis Date  . Arthritis    Past Surgical History  Procedure Laterality Date  . Neck surgery  2010    cerv disc fused   . Colonoscopy    .  Bunionectomy  2013    rt foot  . Abdominal hysterectomy  1995  . Muscle biopsy Left 10/03/2012    Procedure: LEFT QUADRICEP MUSCLE BIOPSY;  Surgeon: Odis Hollingshead, MD;  Location: [place];  Service: General;  Laterality: Left;   Family History  Problem Relation Age of Onset  . Hypertension Mother   . Cancer Sister    History  Substance Use Topics  . Smoking status: Former Smoker    Quit date: 03/23/2008  . Smokeless tobacco: Never Used  . Alcohol Use: No   OB History   Grav Para Term Preterm Abortions TAB SAB Ect Mult Living                 Review of Systems  Constitutional: Negative for fever and chills.  HENT: Negative for sore throat.   Eyes: Negative for visual disturbance.  Respiratory: Negative for cough and shortness of breath.   Cardiovascular: Negative for chest pain.  Gastrointestinal: Negative for vomiting, abdominal pain and diarrhea.  Genitourinary: Negative for dysuria and flank pain.  Musculoskeletal: Negative for back pain and neck pain.  Skin: Negative for rash.  Neurological: Positive for weakness. Negative for speech difficulty, numbness and headaches.  Hematological: Does not bruise/bleed easily.  Psychiatric/Behavioral: Negative for confusion.    Allergies  Oxycontin and Gadolinium derivatives  Home Medications   Current Outpatient Rx  Name  Route  Sig  Dispense  Refill  . fish oil-omega-3 fatty acids 1000 MG capsule   Oral  Take 2 g by mouth daily.         Marland Kitchen HYDROcodone-acetaminophen (NORCO) 5-325 MG per tablet   Oral   Take 1-2 tablets by mouth every 4 (four) hours as needed for pain.   30 tablet   0   . Multiple Vitamins-Minerals (CENTRUM SILVER ADULT 50+ PO)   Oral   Take by mouth.         . sertraline (ZOLOFT) 100 MG tablet   Oral   Take 100 mg by mouth daily.          BP 117/59  Pulse 81  Temp(Src) 98.9 F (37.2 C) (Oral)  Resp 14  SpO2 99% Physical Exam  Nursing note and vitals  reviewed. Constitutional: She is oriented to person, place, and time. She appears well-developed and well-nourished. No distress.  HENT:  Head: Atraumatic.  Nose: Nose normal.  Mouth/Throat: Oropharynx is clear and moist.  Eyes: Conjunctivae are normal. Pupils are equal, round, and reactive to light. No scleral icterus.  Neck: Neck supple. No tracheal deviation present. No thyromegaly present.  Cardiovascular: Normal rate, regular rhythm, normal heart sounds and intact distal pulses.   Pulmonary/Chest: Effort normal and breath sounds normal. No respiratory distress.  Abdominal: Soft. Normal appearance and bowel sounds are normal. She exhibits no distension and no mass. There is no tenderness. There is no rebound and no guarding.  Genitourinary:  No cva tenderness  Musculoskeletal: Normal range of motion. She exhibits no edema and no tenderness.  Surgical incision neck and upper back healing without sign of infection, no drainage or erythema. Remainder spine non tender.   Neurological: She is alert and oriented to person, place, and time.  No pronator drift. Motor intact bil, bil deltoid, bicep, tricep, wrist flex/ext, finger abduc 4+-5/5. Able to flex at bil hips, lift legs of stretcher, ankle and great toe flex/exte 5/5. ambulates w slow gait to bathroom, states c/w baseline. Symmetric reflexes. sens intact.   Skin: Skin is warm and dry. No rash noted.  Psychiatric: She has a normal mood and affect.    ED Course  Procedures (including critical care time)   Results for orders placed during the hospital encounter of 04/20/13  COMPREHENSIVE METABOLIC PANEL      Result Value Range   Sodium 137  137 - 147 mEq/L   Potassium 4.3  3.7 - 5.3 mEq/L   Chloride 98  96 - 112 mEq/L   CO2 25  19 - 32 mEq/L   Glucose, Bld 122 (*) 70 - 99 mg/dL   BUN 11  6 - 23 mg/dL   Creatinine, Ser 0.85  0.50 - 1.10 mg/dL   Calcium 9.3  8.4 - 10.5 mg/dL   Total Protein 7.1  6.0 - 8.3 g/dL   Albumin 3.2 (*)  3.5 - 5.2 g/dL   AST 13  0 - 37 U/L   ALT 11  0 - 35 U/L   Alkaline Phosphatase 80  39 - 117 U/L   Total Bilirubin 0.5  0.3 - 1.2 mg/dL   GFR calc non Af Amer 70 (*) >90 mL/min   GFR calc Af Amer 81 (*) >90 mL/min  CBC      Result Value Range   WBC 10.4  4.0 - 10.5 K/uL   RBC 4.11  3.87 - 5.11 MIL/uL   Hemoglobin 12.1  12.0 - 15.0 g/dL   HCT 36.3  36.0 - 46.0 %   MCV 88.3  78.0 - 100.0 fL   MCH  29.4  26.0 - 34.0 pg   MCHC 33.3  30.0 - 36.0 g/dL   RDW 14.3  11.5 - 15.5 %   Platelets 344  150 - 400 K/uL  TROPONIN I      Result Value Range   Troponin I <0.30  <0.30 ng/mL  URINALYSIS, ROUTINE W REFLEX MICROSCOPIC      Result Value Range   Color, Urine YELLOW  YELLOW   APPearance CLEAR  CLEAR   Specific Gravity, Urine 1.011  1.005 - 1.030   pH 7.0  5.0 - 8.0   Glucose, UA NEGATIVE  NEGATIVE mg/dL   Hgb urine dipstick NEGATIVE  NEGATIVE   Bilirubin Urine NEGATIVE  NEGATIVE   Ketones, ur NEGATIVE  NEGATIVE mg/dL   Protein, ur NEGATIVE  NEGATIVE mg/dL   Urobilinogen, UA 0.2  0.0 - 1.0 mg/dL   Nitrite NEGATIVE  NEGATIVE   Leukocytes, UA TRACE (*) NEGATIVE  URINE MICROSCOPIC-ADD ON      Result Value Range   Squamous Epithelial / LPF RARE  RARE   WBC, UA 0-2  <3 WBC/hpf   RBC / HPF 0-2  <3 RBC/hpf   Bacteria, UA RARE  RARE       EKG Interpretation   None       MDM  Labs. Iv ns.  zofran for nausea.  Reviewed nursing notes and prior charts for additional history.   Pt ambulates to bathroom. Able to drink fluids, eat crackers.   Pt states feels due to pain med/back pain. Dilaudid .5 mg iv. zofran iv.   Reviewed notes from [place] incl recent post op visit whereby pt also noted to c/o gen weak feeling then since surgery, and noted dec appetite since surgery as well.  On recheck pt does appear to have mildly depressed mood.  Discussed w pt, incl as recently stopped her anti-depressant med. Prior charts including zoloft, pt states has cymbalta and has med/adequate rx at  home.   Pt denies feeling despondent, denies feeling very depressed.  Did encourage her to restart he med and follow up closely w pcp.  Recheck pt sitting upright, comfortable. No faintness or dizziness. States if anything muscular strength/function arms/legs sl better since surgery.  No focal or unilateral numbness/weakness. No headache. No fevers. No cp.   Pt appears stable for d/c. Recommend optimizing nutrition, limited pain/muscle relaxer use, getting back on her antidep med, and close pcp follow up in the next couple days for recheck.    Return precautions given including inc or focal weakness or numbness, fevers, faint, trouble breathing, cp, or other acute/new symptoms.        Mirna Mires, MD 04/20/13 [phone]

## 2013-04-20 NOTE — Discharge Instructions (Signed)
Rest. Drink plenty of fluids. Optimize your nutritional status, may try supplementing diet with ensure or boost shakes 3x/day. Minimize use of pain and muscle relaxant medication. Resume/restart your anti-depressant medication (cymbalta or zoloft).  Follow up with primary care doctor in the next couple days for recheck - call office today or tomorrow morning to arrange that follow up.  Return to ER if worse, fevers, faint, trouble breathing, worsening pain at surgical site/neck or if redness/drainage from wound, new or worsening extremity numbness/weakness, chest pain, if feeling very depressed, other concern.      Weakness Weakness is a lack of strength. It may be felt all over the body (generalized) or in one specific part of the body (focal). Some causes of weakness can be serious. You may need further medical evaluation, especially if you are elderly or you have a history of immunosuppression (such as chemotherapy or HIV), kidney disease, heart disease, or diabetes. CAUSES  Weakness can be caused by many different things, including:  Infection.  Physical exhaustion.  Internal bleeding or other blood loss that results in a lack of red blood cells (anemia).  Dehydration. This cause is more common in elderly people.  Side effects or electrolyte abnormalities from medicines, such as pain medicines or sedatives.  Emotional distress, anxiety, or depression.  Circulation problems, especially severe peripheral arterial disease.  Heart disease, such as rapid atrial fibrillation, bradycardia, or heart failure.  Nervous system disorders, such as Guillain-Barr syndrome, multiple sclerosis, or stroke. DIAGNOSIS  To find the cause of your weakness, your caregiver will take your history and perform a physical exam. Lab tests or X-rays may also be ordered, if needed. TREATMENT  Treatment of weakness depends on the cause of your symptoms and can vary greatly. HOME CARE INSTRUCTIONS   Rest  as needed.  Eat a well-balanced diet.  Try to get some exercise every day.  Only take over-the-counter or prescription medicines as directed by your caregiver. SEEK MEDICAL CARE IF:   Your weakness seems to be getting worse or spreads to other parts of your body.  You develop new aches or pains. SEEK IMMEDIATE MEDICAL CARE IF:   You cannot perform your normal daily activities, such as getting dressed and feeding yourself.  You cannot walk up and down stairs, or you feel exhausted when you do so.  You have shortness of breath or chest pain.  You have difficulty moving parts of your body.  You have weakness in only one area of the body or on only one side of the body.  You have a fever.  You have trouble speaking or swallowing.  You cannot control your bladder or bowel movements.  You have black or bloody vomit or stools. MAKE SURE YOU:  Understand these instructions.  Will watch your condition.  Will get help right away if you are not doing well or get worse. Document Released: 03/09/2005 Document Revised: 09/08/2011 Document Reviewed: 05/08/2011 [facility] Patient Information 2014 [place].

## 2013-04-20 NOTE — ED Notes (Signed)
Bed: FT73 Expected date:  Expected time:  Means of arrival:  Comments: EMS- abdominal pain, constipation

## 2013-04-20 NOTE — ED Notes (Signed)
Per EMS: pt had appt with PCP at 1030 for same complaint but pt states she couldn't wait and came here. Pt c/o generalized weakness x 2 days and loss of appetite x 3 weeks since cervical surgery. Denies n/v and pain.

## 2013-04-20 NOTE — ED Notes (Signed)
Pt ambulated in hallway to bathroom. Pt drank water

## 2013-04-21 DIAGNOSIS — K5900 Constipation, unspecified: Secondary | ICD-10-CM | POA: Diagnosis not present

## 2013-04-21 DIAGNOSIS — F329 Major depressive disorder, single episode, unspecified: Secondary | ICD-10-CM | POA: Diagnosis not present

## 2013-04-21 DIAGNOSIS — E041 Nontoxic single thyroid nodule: Secondary | ICD-10-CM | POA: Diagnosis not present

## 2013-04-21 DIAGNOSIS — R569 Unspecified convulsions: Secondary | ICD-10-CM | POA: Diagnosis not present

## 2013-04-21 DIAGNOSIS — F3289 Other specified depressive episodes: Secondary | ICD-10-CM | POA: Diagnosis not present

## 2013-04-24 DIAGNOSIS — M545 Low back pain, unspecified: Secondary | ICD-10-CM | POA: Diagnosis not present

## 2013-04-24 DIAGNOSIS — M542 Cervicalgia: Secondary | ICD-10-CM | POA: Diagnosis not present

## 2013-04-24 DIAGNOSIS — R262 Difficulty in walking, not elsewhere classified: Secondary | ICD-10-CM | POA: Diagnosis not present

## 2013-04-24 DIAGNOSIS — M6281 Muscle weakness (generalized): Secondary | ICD-10-CM | POA: Diagnosis not present

## 2013-05-02 DIAGNOSIS — M542 Cervicalgia: Secondary | ICD-10-CM | POA: Diagnosis not present

## 2013-05-02 DIAGNOSIS — M6281 Muscle weakness (generalized): Secondary | ICD-10-CM | POA: Diagnosis not present

## 2013-05-02 DIAGNOSIS — R262 Difficulty in walking, not elsewhere classified: Secondary | ICD-10-CM | POA: Diagnosis not present

## 2013-05-02 DIAGNOSIS — M545 Low back pain, unspecified: Secondary | ICD-10-CM | POA: Diagnosis not present

## 2013-05-04 DIAGNOSIS — R262 Difficulty in walking, not elsewhere classified: Secondary | ICD-10-CM | POA: Diagnosis not present

## 2013-05-04 DIAGNOSIS — M545 Low back pain, unspecified: Secondary | ICD-10-CM | POA: Diagnosis not present

## 2013-05-04 DIAGNOSIS — M542 Cervicalgia: Secondary | ICD-10-CM | POA: Diagnosis not present

## 2013-05-04 DIAGNOSIS — M6281 Muscle weakness (generalized): Secondary | ICD-10-CM | POA: Diagnosis not present

## 2013-05-16 DIAGNOSIS — M545 Low back pain, unspecified: Secondary | ICD-10-CM | POA: Diagnosis not present

## 2013-05-16 DIAGNOSIS — R262 Difficulty in walking, not elsewhere classified: Secondary | ICD-10-CM | POA: Diagnosis not present

## 2013-05-16 DIAGNOSIS — M542 Cervicalgia: Secondary | ICD-10-CM | POA: Diagnosis not present

## 2013-05-16 DIAGNOSIS — M6281 Muscle weakness (generalized): Secondary | ICD-10-CM | POA: Diagnosis not present

## 2013-05-19 DIAGNOSIS — M542 Cervicalgia: Secondary | ICD-10-CM | POA: Diagnosis not present

## 2013-05-19 DIAGNOSIS — M6281 Muscle weakness (generalized): Secondary | ICD-10-CM | POA: Diagnosis not present

## 2013-05-19 DIAGNOSIS — M545 Low back pain, unspecified: Secondary | ICD-10-CM | POA: Diagnosis not present

## 2013-05-19 DIAGNOSIS — R262 Difficulty in walking, not elsewhere classified: Secondary | ICD-10-CM | POA: Diagnosis not present

## 2013-05-23 DIAGNOSIS — M6281 Muscle weakness (generalized): Secondary | ICD-10-CM | POA: Diagnosis not present

## 2013-05-23 DIAGNOSIS — M545 Low back pain, unspecified: Secondary | ICD-10-CM | POA: Diagnosis not present

## 2013-05-23 DIAGNOSIS — M542 Cervicalgia: Secondary | ICD-10-CM | POA: Diagnosis not present

## 2013-05-23 DIAGNOSIS — R262 Difficulty in walking, not elsewhere classified: Secondary | ICD-10-CM | POA: Diagnosis not present

## 2013-05-24 DIAGNOSIS — M545 Low back pain, unspecified: Secondary | ICD-10-CM | POA: Diagnosis not present

## 2013-05-24 DIAGNOSIS — M542 Cervicalgia: Secondary | ICD-10-CM | POA: Diagnosis not present

## 2013-05-24 DIAGNOSIS — M6281 Muscle weakness (generalized): Secondary | ICD-10-CM | POA: Diagnosis not present

## 2013-05-24 DIAGNOSIS — R262 Difficulty in walking, not elsewhere classified: Secondary | ICD-10-CM | POA: Diagnosis not present

## 2013-05-29 DIAGNOSIS — R262 Difficulty in walking, not elsewhere classified: Secondary | ICD-10-CM | POA: Diagnosis not present

## 2013-05-29 DIAGNOSIS — M6281 Muscle weakness (generalized): Secondary | ICD-10-CM | POA: Diagnosis not present

## 2013-05-29 DIAGNOSIS — M545 Low back pain, unspecified: Secondary | ICD-10-CM | POA: Diagnosis not present

## 2013-05-29 DIAGNOSIS — M542 Cervicalgia: Secondary | ICD-10-CM | POA: Diagnosis not present

## 2013-05-31 DIAGNOSIS — M545 Low back pain, unspecified: Secondary | ICD-10-CM | POA: Diagnosis not present

## 2013-05-31 DIAGNOSIS — M6281 Muscle weakness (generalized): Secondary | ICD-10-CM | POA: Diagnosis not present

## 2013-05-31 DIAGNOSIS — R262 Difficulty in walking, not elsewhere classified: Secondary | ICD-10-CM | POA: Diagnosis not present

## 2013-05-31 DIAGNOSIS — M542 Cervicalgia: Secondary | ICD-10-CM | POA: Diagnosis not present

## 2013-06-05 DIAGNOSIS — R262 Difficulty in walking, not elsewhere classified: Secondary | ICD-10-CM | POA: Diagnosis not present

## 2013-06-05 DIAGNOSIS — M6281 Muscle weakness (generalized): Secondary | ICD-10-CM | POA: Diagnosis not present

## 2013-06-05 DIAGNOSIS — M542 Cervicalgia: Secondary | ICD-10-CM | POA: Diagnosis not present

## 2013-06-05 DIAGNOSIS — M545 Low back pain, unspecified: Secondary | ICD-10-CM | POA: Diagnosis not present

## 2013-06-07 DIAGNOSIS — R262 Difficulty in walking, not elsewhere classified: Secondary | ICD-10-CM | POA: Diagnosis not present

## 2013-06-07 DIAGNOSIS — M545 Low back pain, unspecified: Secondary | ICD-10-CM | POA: Diagnosis not present

## 2013-06-07 DIAGNOSIS — M6281 Muscle weakness (generalized): Secondary | ICD-10-CM | POA: Diagnosis not present

## 2013-06-07 DIAGNOSIS — M542 Cervicalgia: Secondary | ICD-10-CM | POA: Diagnosis not present

## 2013-06-12 DIAGNOSIS — M545 Low back pain, unspecified: Secondary | ICD-10-CM | POA: Diagnosis not present

## 2013-06-12 DIAGNOSIS — M6281 Muscle weakness (generalized): Secondary | ICD-10-CM | POA: Diagnosis not present

## 2013-06-12 DIAGNOSIS — R262 Difficulty in walking, not elsewhere classified: Secondary | ICD-10-CM | POA: Diagnosis not present

## 2013-06-12 DIAGNOSIS — M542 Cervicalgia: Secondary | ICD-10-CM | POA: Diagnosis not present

## 2013-06-14 DIAGNOSIS — M545 Low back pain, unspecified: Secondary | ICD-10-CM | POA: Diagnosis not present

## 2013-06-14 DIAGNOSIS — M6281 Muscle weakness (generalized): Secondary | ICD-10-CM | POA: Diagnosis not present

## 2013-06-14 DIAGNOSIS — R262 Difficulty in walking, not elsewhere classified: Secondary | ICD-10-CM | POA: Diagnosis not present

## 2013-06-14 DIAGNOSIS — M542 Cervicalgia: Secondary | ICD-10-CM | POA: Diagnosis not present

## 2013-06-15 DIAGNOSIS — M542 Cervicalgia: Secondary | ICD-10-CM | POA: Diagnosis not present

## 2013-06-19 DIAGNOSIS — M542 Cervicalgia: Secondary | ICD-10-CM | POA: Diagnosis not present

## 2013-06-19 DIAGNOSIS — M6281 Muscle weakness (generalized): Secondary | ICD-10-CM | POA: Diagnosis not present

## 2013-06-19 DIAGNOSIS — M545 Low back pain, unspecified: Secondary | ICD-10-CM | POA: Diagnosis not present

## 2013-06-19 DIAGNOSIS — R262 Difficulty in walking, not elsewhere classified: Secondary | ICD-10-CM | POA: Diagnosis not present

## 2013-06-21 DIAGNOSIS — R262 Difficulty in walking, not elsewhere classified: Secondary | ICD-10-CM | POA: Diagnosis not present

## 2013-06-21 DIAGNOSIS — M6281 Muscle weakness (generalized): Secondary | ICD-10-CM | POA: Diagnosis not present

## 2013-06-21 DIAGNOSIS — M545 Low back pain, unspecified: Secondary | ICD-10-CM | POA: Diagnosis not present

## 2013-06-21 DIAGNOSIS — M542 Cervicalgia: Secondary | ICD-10-CM | POA: Diagnosis not present

## 2013-06-26 DIAGNOSIS — M6281 Muscle weakness (generalized): Secondary | ICD-10-CM | POA: Diagnosis not present

## 2013-06-26 DIAGNOSIS — M542 Cervicalgia: Secondary | ICD-10-CM | POA: Diagnosis not present

## 2013-06-26 DIAGNOSIS — M545 Low back pain, unspecified: Secondary | ICD-10-CM | POA: Diagnosis not present

## 2013-06-26 DIAGNOSIS — R262 Difficulty in walking, not elsewhere classified: Secondary | ICD-10-CM | POA: Diagnosis not present

## 2013-06-27 NOTE — Telephone Encounter (Signed)
Closing encounter

## 2013-06-28 DIAGNOSIS — M6281 Muscle weakness (generalized): Secondary | ICD-10-CM | POA: Diagnosis not present

## 2013-06-28 DIAGNOSIS — M542 Cervicalgia: Secondary | ICD-10-CM | POA: Diagnosis not present

## 2013-06-28 DIAGNOSIS — M545 Low back pain, unspecified: Secondary | ICD-10-CM | POA: Diagnosis not present

## 2013-06-28 DIAGNOSIS — R262 Difficulty in walking, not elsewhere classified: Secondary | ICD-10-CM | POA: Diagnosis not present

## 2013-07-03 DIAGNOSIS — M545 Low back pain, unspecified: Secondary | ICD-10-CM | POA: Diagnosis not present

## 2013-07-03 DIAGNOSIS — R262 Difficulty in walking, not elsewhere classified: Secondary | ICD-10-CM | POA: Diagnosis not present

## 2013-07-03 DIAGNOSIS — M542 Cervicalgia: Secondary | ICD-10-CM | POA: Diagnosis not present

## 2013-07-03 DIAGNOSIS — M6281 Muscle weakness (generalized): Secondary | ICD-10-CM | POA: Diagnosis not present

## 2013-07-04 DIAGNOSIS — Z0000 Encounter for general adult medical examination without abnormal findings: Secondary | ICD-10-CM | POA: Diagnosis not present

## 2013-07-04 DIAGNOSIS — N390 Urinary tract infection, site not specified: Secondary | ICD-10-CM | POA: Diagnosis not present

## 2013-07-04 DIAGNOSIS — R5381 Other malaise: Secondary | ICD-10-CM | POA: Diagnosis not present

## 2013-07-04 DIAGNOSIS — R5383 Other fatigue: Secondary | ICD-10-CM | POA: Diagnosis not present

## 2013-07-04 DIAGNOSIS — M4712 Other spondylosis with myelopathy, cervical region: Secondary | ICD-10-CM | POA: Diagnosis not present

## 2013-07-04 DIAGNOSIS — R748 Abnormal levels of other serum enzymes: Secondary | ICD-10-CM | POA: Diagnosis not present

## 2013-07-04 DIAGNOSIS — E041 Nontoxic single thyroid nodule: Secondary | ICD-10-CM | POA: Diagnosis not present

## 2013-07-05 DIAGNOSIS — M542 Cervicalgia: Secondary | ICD-10-CM | POA: Diagnosis not present

## 2013-07-05 DIAGNOSIS — M545 Low back pain, unspecified: Secondary | ICD-10-CM | POA: Diagnosis not present

## 2013-07-05 DIAGNOSIS — M6281 Muscle weakness (generalized): Secondary | ICD-10-CM | POA: Diagnosis not present

## 2013-07-05 DIAGNOSIS — R262 Difficulty in walking, not elsewhere classified: Secondary | ICD-10-CM | POA: Diagnosis not present

## 2013-07-10 DIAGNOSIS — M542 Cervicalgia: Secondary | ICD-10-CM | POA: Diagnosis not present

## 2013-07-10 DIAGNOSIS — M6281 Muscle weakness (generalized): Secondary | ICD-10-CM | POA: Diagnosis not present

## 2013-07-10 DIAGNOSIS — R262 Difficulty in walking, not elsewhere classified: Secondary | ICD-10-CM | POA: Diagnosis not present

## 2013-07-10 DIAGNOSIS — M545 Low back pain, unspecified: Secondary | ICD-10-CM | POA: Diagnosis not present

## 2013-07-11 ENCOUNTER — Other Ambulatory Visit: Payer: Self-pay | Admitting: Internal Medicine

## 2013-07-11 DIAGNOSIS — Z23 Encounter for immunization: Secondary | ICD-10-CM | POA: Diagnosis not present

## 2013-07-11 DIAGNOSIS — M545 Low back pain, unspecified: Secondary | ICD-10-CM | POA: Diagnosis not present

## 2013-07-11 DIAGNOSIS — R262 Difficulty in walking, not elsewhere classified: Secondary | ICD-10-CM | POA: Diagnosis not present

## 2013-07-11 DIAGNOSIS — K5900 Constipation, unspecified: Secondary | ICD-10-CM | POA: Diagnosis not present

## 2013-07-11 DIAGNOSIS — M542 Cervicalgia: Secondary | ICD-10-CM | POA: Diagnosis not present

## 2013-07-11 DIAGNOSIS — M4712 Other spondylosis with myelopathy, cervical region: Secondary | ICD-10-CM | POA: Diagnosis not present

## 2013-07-11 DIAGNOSIS — E041 Nontoxic single thyroid nodule: Secondary | ICD-10-CM | POA: Diagnosis not present

## 2013-07-11 DIAGNOSIS — M6281 Muscle weakness (generalized): Secondary | ICD-10-CM | POA: Diagnosis not present

## 2013-07-11 DIAGNOSIS — E042 Nontoxic multinodular goiter: Secondary | ICD-10-CM

## 2013-07-17 ENCOUNTER — Encounter (HOSPITAL_COMMUNITY): Payer: Self-pay | Admitting: Emergency Medicine

## 2013-07-17 ENCOUNTER — Emergency Department (HOSPITAL_COMMUNITY): Payer: Medicare Other

## 2013-07-17 ENCOUNTER — Emergency Department (HOSPITAL_COMMUNITY)
Admission: EM | Admit: 2013-07-17 | Discharge: 2013-07-18 | Disposition: A | Discharge status: Home / Self Care | Payer: Medicare Other | Attending: Emergency Medicine | Admitting: Emergency Medicine

## 2013-07-17 DIAGNOSIS — Z79899 Other long term (current) drug therapy: Secondary | ICD-10-CM | POA: Insufficient documentation

## 2013-07-17 DIAGNOSIS — Z8739 Personal history of other diseases of the musculoskeletal system and connective tissue: Secondary | ICD-10-CM | POA: Diagnosis not present

## 2013-07-17 DIAGNOSIS — S0993XA Unspecified injury of face, initial encounter: Secondary | ICD-10-CM | POA: Diagnosis not present

## 2013-07-17 DIAGNOSIS — S46909A Unspecified injury of unspecified muscle, fascia and tendon at shoulder and upper arm level, unspecified arm, initial encounter: Secondary | ICD-10-CM | POA: Insufficient documentation

## 2013-07-17 DIAGNOSIS — S199XXA Unspecified injury of neck, initial encounter: Secondary | ICD-10-CM

## 2013-07-17 DIAGNOSIS — Z87891 Personal history of nicotine dependence: Secondary | ICD-10-CM | POA: Insufficient documentation

## 2013-07-17 DIAGNOSIS — IMO0002 Reserved for concepts with insufficient information to code with codable children: Secondary | ICD-10-CM | POA: Diagnosis not present

## 2013-07-17 DIAGNOSIS — Z9889 Other specified postprocedural states: Secondary | ICD-10-CM | POA: Diagnosis not present

## 2013-07-17 DIAGNOSIS — Y9301 Activity, walking, marching and hiking: Secondary | ICD-10-CM | POA: Insufficient documentation

## 2013-07-17 DIAGNOSIS — S4990XA Unspecified injury of shoulder and upper arm, unspecified arm, initial encounter: Secondary | ICD-10-CM

## 2013-07-17 DIAGNOSIS — W208XXA Other cause of strike by thrown, projected or falling object, initial encounter: Secondary | ICD-10-CM | POA: Insufficient documentation

## 2013-07-17 DIAGNOSIS — M542 Cervicalgia: Secondary | ICD-10-CM | POA: Diagnosis not present

## 2013-07-17 DIAGNOSIS — S4980XA Other specified injuries of shoulder and upper arm, unspecified arm, initial encounter: Secondary | ICD-10-CM | POA: Insufficient documentation

## 2013-07-17 DIAGNOSIS — Y9229 Other specified public building as the place of occurrence of the external cause: Secondary | ICD-10-CM | POA: Insufficient documentation

## 2013-07-17 MED ORDER — OXYCODONE-ACETAMINOPHEN 5-325 MG PO TABS
1.0000 | ORAL_TABLET | Freq: Once | ORAL | Status: AC
  Administered 2013-07-17: 1 via ORAL
  Filled 2013-07-17: qty 1

## 2013-07-17 MED ORDER — ONDANSETRON 4 MG PO TBDP
4.0000 mg | ORAL_TABLET | Freq: Once | ORAL | Status: AC
  Administered 2013-07-17: 4 mg via ORAL
  Filled 2013-07-17: qty 1

## 2013-07-17 NOTE — ED Notes (Signed)
Pt transported to CT ?

## 2013-07-17 NOTE — ED Notes (Signed)
Pt returned from CT °
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
## 2013-07-18 MED ORDER — OXYCODONE-ACETAMINOPHEN 5-325 MG PO TABS: 1.0000 | ORAL_TABLET | Freq: Four times a day (QID) | ORAL | Status: DC | PRN

## 2013-07-18 MED ORDER — OXYCODONE-ACETAMINOPHEN 5-325 MG PO TABS
1.0000 | ORAL_TABLET | Freq: Once | ORAL | Status: AC
  Administered 2013-07-18: 1 via ORAL
  Filled 2013-07-18: qty 1

## 2013-07-18 NOTE — ED Provider Notes (Signed)
Medical screening examination/treatment/procedure(s) were performed by non-physician practitioner and as supervising physician I was immediately available for consultation/collaboration.   EKG Interpretation None       Threasa Beards, MD 07/18/13 2150

## 2013-07-18 NOTE — Discharge Instructions (Signed)
Acromioclavicular Injuries °The AC (acromioclavicular) joint is the joint in the shoulder where the collarbone (clavicle) meets the shoulder blade (scapula). The part of the shoulder blade connected to the collarbone is called the acromion. Common problems with and treatments for the AC joint are detailed below. °ARTHRITIS °Arthritis occurs when the joint has been injured and the smooth padding between the joints (cartilage) is lost. This is the wear and tear seen in most joints of the body if they have been overused. This causes the joint to produce pain and swelling which is worse with activity.  °AC JOINT SEPARATION °AC joint separation means that the ligaments connecting the acromion of the shoulder blade and collarbone have been damaged, and the two bones no longer line up. AC separations can be anywhere from mild to severe, and are "graded" depending upon which ligaments are torn and how badly they are torn. °· Grade I Injury: the least damage is done, and the AC joint still lines up. °· Grade II Injury: damage to the ligaments which reinforce the AC joint. In a Grade II injury, these ligaments are stretched but not entirely torn. When stressed, the AC joint becomes painful and unstable. °· Grade III Injury: AC and secondary ligaments are completely torn, and the collarbone is no longer attached to the shoulder blade. This results in deformity; a prominence of the end of the clavicle. °AC JOINT FRACTURE °AC joint fracture means that there has been a break in the bones of the AC joint, usually the end of the clavicle. °TREATMENT °TREATMENT OF AC ARTHRITIS °· There is currently no way to replace the cartilage damaged by arthritis. The best way to improve the condition is to decrease the activities which aggravate the problem. Application of ice to the joint helps decrease pain and soreness (inflammation). The use of non-steroidal anti-inflammatory medication is helpful. °· If less conservative measures do not  work, then cortisone shots (injections) may be used. These are anti-inflammatories; they decrease the soreness in the joint and swelling. °· If non-surgical measures fail, surgery may be recommended. The procedure is generally removal of a portion of the end of the clavicle. This is the part of the collarbone closest to your acromion which is stabilized with ligaments to the acromion of the shoulder blade. This surgery may be performed using a tube-like instrument with a light (arthroscope) for looking into a joint. It may also be performed as an open surgery through a small incision by the surgeon. Most patients will have good range of motion within 6 weeks and may return to all activity including sports by 8-12 weeks, barring complications. °TREATMENT OF AN AC SEPARATION °· The initial treatment is to decrease pain. This is best accomplished by immobilizing the arm in a sling and placing an ice pack to the shoulder for 20 to 30 minutes every 2 hours as needed. As the pain starts to subside, it is important to begin moving the fingers, wrist, elbow and eventually the shoulder in order to prevent a stiff or "frozen" shoulder. Instruction on when and how much to move the shoulder will be provided by your caregiver. The length of time needed to regain full motion and function depends on the amount or grade of the injury. Recovery from a Grade I AC separation usually takes 10 to 14 days, whereas a Grade III may take 6 to 8 weeks. °· Grade I and II separations usually do not require surgery. Even Grade III injuries usually allow return to full   activity with few restrictions. Treatment is also based on the activity demands of the injured shoulder. For example, a high level quarterback with an injured throwing arm will receive more aggressive treatment than someone with a desk job who rarely uses his/her arm for strenuous activities. In some cases, a painful lump may persist which could require a later surgery. Surgery  can be very successful, but the benefits must be weighed against the potential risks. TREATMENT OF AN AC JOINT FRACTURE Fracture treatment depends on the type of fracture. Sometimes a splint or sling may be all that is required. Other times surgery may be required for repair. This is more frequently the case when the ligaments supporting the clavicle are completely torn. Your caregiver will help you with these decisions and together you can decide what will be the best treatment. HOME CARE INSTRUCTIONS   Apply ice to the injury for 15-20 minutes each hour while awake for 2 days. Put the ice in a plastic bag and place a towel between the bag of ice and skin.  If a sling has been applied, wear it constantly for as long as directed by your caregiver, even at night. The sling or splint can be removed for bathing or showering or as directed. Be sure to keep the shoulder in the same place as when the sling is on. Do not lift the arm.  If a figure-of-eight splint has been applied it should be tightened gently by another person every day. Tighten it enough to keep the shoulders held back. Allow enough room to place the index finger between the body and strap. Loosen the splint immediately if there is numbness or tingling in the hands.  Take over-the-counter or prescription medicines for pain, discomfort or fever as directed by your caregiver.  If you or your child has received a follow up appointment, it is very important to keep that appointment in order to avoid long term complications, chronic pain or disability. SEEK MEDICAL CARE IF:   The pain is not relieved with medications.  There is increased swelling or discoloration that continues to get worse rather than better.  You or your child has been unable to follow up as instructed.  There is progressive numbness and tingling in the arm, forearm or hand. SEEK IMMEDIATE MEDICAL CARE IF:   The arm is numb, cold or pale.  There is increasing pain  in the hand, forearm or fingers. MAKE SURE YOU:   Understand these instructions.  Will watch your condition.  Will get help right away if you are not doing well or get worse. Document Released: 12/17/2004 Document Revised: 06/01/2011 Document Reviewed: 06/11/2008 [facility] Patient Information 2014 [place].   Soft Tissue Injury of the Neck A soft tissue injury of the neck may be either blunt or penetrating. A blunt injury does not break the skin. A penetrating injury breaks the skin, creating an open wound. Blunt injuries may happen in several ways. Most involve some type of direct blow to the neck. This can cause serious injury to the windpipe, voice box, cervical spine, or esophagus. In some cases, the injury to the soft tissue can also result in a break (fracture) of the cervical spine.  Soft tissue injuries of the neck require immediate medical care. Sometimes, you may not notice the signs of injury right away. You may feel fine at first, but the swelling may eventually close off your airway. This could result in a significant or life-threatening injury. This is rare, but  it is important to keep in mind with any injury to the neck.  CAUSES  Causes of blunt injury may include:  "Clothesline" injuries. This happens when someone is moving at high speed and runs into a clothesline, outstretched arm, or similar object. This results in a direct injury to the front of the neck. If the airway is blocked, it can cause suffocation due to lack of oxygen (asphyxiation) or even instant death.  High-energy trauma. This includes injuries from motor vehicle crashes, falling from a great height, or heavy objects falling onto the neck.  Sports-related injuries. Injury to the windpipe and voice box can result from being struck by another player or being struck by an object, such as a baseball, hockey stick, or an outstretched arm.  Strangulation. This type of injury may cause skin trauma, hoarseness  of voice, or broken cartilage in the voice box or windpipe. It may also cause a serious airway problem. SYMPTOMS   Bruising.  Pain and tenderness in the neck.  Swelling of the neck and face.  Hoarseness of voice.  Pain or difficulty with swallowing.  Drooling or inability to swallow.  Trouble breathing. This may become worse when lying flat.  Coughing up blood.  High-pitched, harsh, vibratory noise due to partial obstruction of the windpipe (stridor).  Swelling of the upper arms.  Windpipe that appears to be pushed off to one side.  Air in the tissues under the skin of the neck or chest (subcutaneous emphysema). This usually indicates a problem with the normal airway and is a medical emergency. DIAGNOSIS   If possible, your caregiver may ask about the details of how the injury occurred. A detailed exam can help to identify specific areas of the neck that are injured.  Your caregiver may ask for tests to rule out injury of the voice box, airway, or esophagus. This may include X-rays, ultrasounds, CT scans, or MRI scans, depending on the severity of your injury. TREATMENT  If you have an injury to your windpipe or voice box, immediate medical care is required. In almost all cases, hospitalization is necessary. For injuries that do not appear to require surgery, it is helpful to have medical observation for 24 hours. You may be asked to do one or more of the following:  Rest your voice.  Bed rest.  Limit your diet, depending on the extent of the injury. Follow your caregiver's dietary guidelines. Often, only fluids and soft foods are recommended.  Keep your head raised.  Breathe humidified air.  Take medicines to control infection, reduce swelling, and reduce normal stomach acid. You may also need pain medicine, depending on your injury. For injuries that appear to require surgery, you will need to stay in the hospital. The exact type of procedure needed will depend on your  exact injury or injuries.  HOME CARE INSTRUCTIONS   If the skin was broken, keep the wound area clean and dry. Wear your bandage (dressing) and care for your wound as instructed.  Follow your caregiver's advice about your diet.  Follow your caregiver's advice about use of your voice.  Take medicines as directed.  Keep your head and neck at least partially raised (elevated) while recovering. This should also be done while sleeping. SEEK MEDICAL CARE IF:   Your voice becomes weaker.  Your swelling or bruising is not improving as expected. Typically, this takes several days to improve.  You feel that you are having problems with medicines prescribed.  You have drainage from  the injury site. This may be a sign that your wound is not healing properly or is infected.  You develop increasing pain or difficulty while swallowing.  You develop an oral temperature of 102 F (38.9 C) or higher. SEEK IMMEDIATE MEDICAL CARE IF:   You cough up blood.  You develop sudden trouble breathing.  You cannot tolerate your oral medicines, or you are unable to swallow.  You develop drooling.  You have new or worsening vomiting.  You develop sudden, new swelling of the neck or face.  You have an oral temperature above 102 F (38.9 C), not controlled by medicine. MAKE SURE YOU:  Understand these instructions.  Will watch your condition.  Will get help right away if you are not doing well or get worse. Document Released: 06/16/2007 Document Revised: 06/01/2011 Document Reviewed: 05/26/2010 [facility] Patient Information 2014 [place].

## 2013-07-19 ENCOUNTER — Ambulatory Visit
Admission: RE | Admit: 2013-07-19 | Discharge: 2013-07-19 | Disposition: A | Discharge status: Home / Self Care | Payer: Medicare Other | Source: Ambulatory Visit | Attending: Internal Medicine | Admitting: Internal Medicine

## 2013-07-19 ENCOUNTER — Other Ambulatory Visit (HOSPITAL_COMMUNITY)
Admission: RE | Admit: 2013-07-19 | Discharge: 2013-07-19 | Disposition: A | Discharge status: Home / Self Care | Payer: Medicare Other | Source: Ambulatory Visit | Attending: Interventional Radiology | Admitting: Interventional Radiology

## 2013-07-19 DIAGNOSIS — E049 Nontoxic goiter, unspecified: Secondary | ICD-10-CM | POA: Insufficient documentation

## 2013-07-19 DIAGNOSIS — E041 Nontoxic single thyroid nodule: Secondary | ICD-10-CM | POA: Diagnosis not present

## 2013-07-19 DIAGNOSIS — E042 Nontoxic multinodular goiter: Secondary | ICD-10-CM

## 2013-07-25 DIAGNOSIS — M6281 Muscle weakness (generalized): Secondary | ICD-10-CM | POA: Diagnosis not present

## 2013-07-25 DIAGNOSIS — R262 Difficulty in walking, not elsewhere classified: Secondary | ICD-10-CM | POA: Diagnosis not present

## 2013-07-25 DIAGNOSIS — M545 Low back pain, unspecified: Secondary | ICD-10-CM | POA: Diagnosis not present

## 2013-07-25 DIAGNOSIS — M542 Cervicalgia: Secondary | ICD-10-CM | POA: Diagnosis not present

## 2013-07-27 DIAGNOSIS — M545 Low back pain, unspecified: Secondary | ICD-10-CM | POA: Diagnosis not present

## 2013-07-27 DIAGNOSIS — R262 Difficulty in walking, not elsewhere classified: Secondary | ICD-10-CM | POA: Diagnosis not present

## 2013-07-27 DIAGNOSIS — M542 Cervicalgia: Secondary | ICD-10-CM | POA: Diagnosis not present

## 2013-07-27 DIAGNOSIS — M6281 Muscle weakness (generalized): Secondary | ICD-10-CM | POA: Diagnosis not present

## 2013-08-01 DIAGNOSIS — R262 Difficulty in walking, not elsewhere classified: Secondary | ICD-10-CM | POA: Diagnosis not present

## 2013-08-01 DIAGNOSIS — M545 Low back pain, unspecified: Secondary | ICD-10-CM | POA: Diagnosis not present

## 2013-08-01 DIAGNOSIS — M542 Cervicalgia: Secondary | ICD-10-CM | POA: Diagnosis not present

## 2013-08-01 DIAGNOSIS — M6281 Muscle weakness (generalized): Secondary | ICD-10-CM | POA: Diagnosis not present

## 2013-08-03 DIAGNOSIS — R262 Difficulty in walking, not elsewhere classified: Secondary | ICD-10-CM | POA: Diagnosis not present

## 2013-08-03 DIAGNOSIS — M6281 Muscle weakness (generalized): Secondary | ICD-10-CM | POA: Diagnosis not present

## 2013-08-03 DIAGNOSIS — M545 Low back pain, unspecified: Secondary | ICD-10-CM | POA: Diagnosis not present

## 2013-08-03 DIAGNOSIS — M542 Cervicalgia: Secondary | ICD-10-CM | POA: Diagnosis not present

## 2013-08-08 DIAGNOSIS — M545 Low back pain, unspecified: Secondary | ICD-10-CM | POA: Diagnosis not present

## 2013-08-08 DIAGNOSIS — R262 Difficulty in walking, not elsewhere classified: Secondary | ICD-10-CM | POA: Diagnosis not present

## 2013-08-08 DIAGNOSIS — M542 Cervicalgia: Secondary | ICD-10-CM | POA: Diagnosis not present

## 2013-08-08 DIAGNOSIS — M6281 Muscle weakness (generalized): Secondary | ICD-10-CM | POA: Diagnosis not present

## 2013-08-10 DIAGNOSIS — M542 Cervicalgia: Secondary | ICD-10-CM | POA: Diagnosis not present

## 2013-08-10 DIAGNOSIS — Z981 Arthrodesis status: Secondary | ICD-10-CM | POA: Diagnosis not present

## 2013-08-15 DIAGNOSIS — F3289 Other specified depressive episodes: Secondary | ICD-10-CM | POA: Diagnosis not present

## 2013-08-15 DIAGNOSIS — M4712 Other spondylosis with myelopathy, cervical region: Secondary | ICD-10-CM | POA: Diagnosis not present

## 2013-08-15 DIAGNOSIS — F329 Major depressive disorder, single episode, unspecified: Secondary | ICD-10-CM | POA: Diagnosis not present

## 2013-08-15 DIAGNOSIS — G4700 Insomnia, unspecified: Secondary | ICD-10-CM | POA: Diagnosis not present

## 2013-08-15 DIAGNOSIS — M25519 Pain in unspecified shoulder: Secondary | ICD-10-CM | POA: Diagnosis not present

## 2013-08-16 DIAGNOSIS — M545 Low back pain, unspecified: Secondary | ICD-10-CM | POA: Diagnosis not present

## 2013-08-16 DIAGNOSIS — R262 Difficulty in walking, not elsewhere classified: Secondary | ICD-10-CM | POA: Diagnosis not present

## 2013-08-16 DIAGNOSIS — M6281 Muscle weakness (generalized): Secondary | ICD-10-CM | POA: Diagnosis not present

## 2013-08-16 DIAGNOSIS — M542 Cervicalgia: Secondary | ICD-10-CM | POA: Diagnosis not present

## 2013-08-18 DIAGNOSIS — R262 Difficulty in walking, not elsewhere classified: Secondary | ICD-10-CM | POA: Diagnosis not present

## 2013-08-18 DIAGNOSIS — M542 Cervicalgia: Secondary | ICD-10-CM | POA: Diagnosis not present

## 2013-08-18 DIAGNOSIS — M545 Low back pain, unspecified: Secondary | ICD-10-CM | POA: Diagnosis not present

## 2013-08-18 DIAGNOSIS — M6281 Muscle weakness (generalized): Secondary | ICD-10-CM | POA: Diagnosis not present

## 2013-08-22 DIAGNOSIS — M542 Cervicalgia: Secondary | ICD-10-CM | POA: Diagnosis not present

## 2013-08-22 DIAGNOSIS — R262 Difficulty in walking, not elsewhere classified: Secondary | ICD-10-CM | POA: Diagnosis not present

## 2013-08-22 DIAGNOSIS — M6281 Muscle weakness (generalized): Secondary | ICD-10-CM | POA: Diagnosis not present

## 2013-08-22 DIAGNOSIS — M545 Low back pain, unspecified: Secondary | ICD-10-CM | POA: Diagnosis not present

## 2013-08-24 DIAGNOSIS — M6281 Muscle weakness (generalized): Secondary | ICD-10-CM | POA: Diagnosis not present

## 2013-08-24 DIAGNOSIS — M542 Cervicalgia: Secondary | ICD-10-CM | POA: Diagnosis not present

## 2013-08-24 DIAGNOSIS — R262 Difficulty in walking, not elsewhere classified: Secondary | ICD-10-CM | POA: Diagnosis not present

## 2013-08-24 DIAGNOSIS — M545 Low back pain, unspecified: Secondary | ICD-10-CM | POA: Diagnosis not present

## 2013-08-29 DIAGNOSIS — R262 Difficulty in walking, not elsewhere classified: Secondary | ICD-10-CM | POA: Diagnosis not present

## 2013-08-29 DIAGNOSIS — M545 Low back pain, unspecified: Secondary | ICD-10-CM | POA: Diagnosis not present

## 2013-08-29 DIAGNOSIS — M6281 Muscle weakness (generalized): Secondary | ICD-10-CM | POA: Diagnosis not present

## 2013-08-29 DIAGNOSIS — M542 Cervicalgia: Secondary | ICD-10-CM | POA: Diagnosis not present

## 2013-08-31 DIAGNOSIS — M542 Cervicalgia: Secondary | ICD-10-CM | POA: Diagnosis not present

## 2013-08-31 DIAGNOSIS — M545 Low back pain, unspecified: Secondary | ICD-10-CM | POA: Diagnosis not present

## 2013-08-31 DIAGNOSIS — M6281 Muscle weakness (generalized): Secondary | ICD-10-CM | POA: Diagnosis not present

## 2013-08-31 DIAGNOSIS — R262 Difficulty in walking, not elsewhere classified: Secondary | ICD-10-CM | POA: Diagnosis not present

## 2013-09-05 DIAGNOSIS — M542 Cervicalgia: Secondary | ICD-10-CM | POA: Diagnosis not present

## 2013-09-05 DIAGNOSIS — F39 Unspecified mood [affective] disorder: Secondary | ICD-10-CM | POA: Diagnosis not present

## 2013-09-06 DIAGNOSIS — M545 Low back pain, unspecified: Secondary | ICD-10-CM | POA: Diagnosis not present

## 2013-09-06 DIAGNOSIS — M6281 Muscle weakness (generalized): Secondary | ICD-10-CM | POA: Diagnosis not present

## 2013-09-06 DIAGNOSIS — M542 Cervicalgia: Secondary | ICD-10-CM | POA: Diagnosis not present

## 2013-09-06 DIAGNOSIS — R262 Difficulty in walking, not elsewhere classified: Secondary | ICD-10-CM | POA: Diagnosis not present

## 2013-09-11 ENCOUNTER — Other Ambulatory Visit: Payer: Self-pay | Admitting: Internal Medicine

## 2013-09-11 DIAGNOSIS — M79604 Pain in right leg: Secondary | ICD-10-CM

## 2013-09-12 ENCOUNTER — Ambulatory Visit
Admission: RE | Admit: 2013-09-12 | Discharge: 2013-09-12 | Disposition: A | Discharge status: Home / Self Care | Payer: Medicare Other | Source: Ambulatory Visit | Attending: Internal Medicine | Admitting: Internal Medicine

## 2013-09-12 DIAGNOSIS — M7989 Other specified soft tissue disorders: Secondary | ICD-10-CM | POA: Diagnosis not present

## 2013-09-12 DIAGNOSIS — M79604 Pain in right leg: Secondary | ICD-10-CM

## 2013-09-12 DIAGNOSIS — M79609 Pain in unspecified limb: Secondary | ICD-10-CM | POA: Diagnosis not present

## 2013-09-14 DIAGNOSIS — M545 Low back pain, unspecified: Secondary | ICD-10-CM | POA: Diagnosis not present

## 2013-09-14 DIAGNOSIS — M6281 Muscle weakness (generalized): Secondary | ICD-10-CM | POA: Diagnosis not present

## 2013-09-14 DIAGNOSIS — M542 Cervicalgia: Secondary | ICD-10-CM | POA: Diagnosis not present

## 2013-09-14 DIAGNOSIS — R262 Difficulty in walking, not elsewhere classified: Secondary | ICD-10-CM | POA: Diagnosis not present

## 2013-09-14 DIAGNOSIS — R6889 Other general symptoms and signs: Secondary | ICD-10-CM | POA: Diagnosis not present

## 2013-09-14 DIAGNOSIS — R609 Edema, unspecified: Secondary | ICD-10-CM | POA: Diagnosis not present

## 2013-09-18 ENCOUNTER — Other Ambulatory Visit: Payer: Self-pay | Admitting: Internal Medicine

## 2013-09-18 DIAGNOSIS — R0989 Other specified symptoms and signs involving the circulatory and respiratory systems: Secondary | ICD-10-CM

## 2013-09-29 ENCOUNTER — Ambulatory Visit
Admission: RE | Admit: 2013-09-29 | Discharge: 2013-09-29 | Disposition: A | Discharge status: Home / Self Care | Payer: Medicare Other | Source: Ambulatory Visit | Attending: Internal Medicine | Admitting: Internal Medicine

## 2013-09-29 DIAGNOSIS — R0989 Other specified symptoms and signs involving the circulatory and respiratory systems: Secondary | ICD-10-CM

## 2013-10-12 DIAGNOSIS — R29898 Other symptoms and signs involving the musculoskeletal system: Secondary | ICD-10-CM | POA: Diagnosis not present

## 2013-10-12 DIAGNOSIS — IMO0002 Reserved for concepts with insufficient information to code with codable children: Secondary | ICD-10-CM | POA: Diagnosis not present

## 2013-10-19 DIAGNOSIS — M542 Cervicalgia: Secondary | ICD-10-CM | POA: Diagnosis not present

## 2013-10-23 DIAGNOSIS — M542 Cervicalgia: Secondary | ICD-10-CM | POA: Diagnosis not present

## 2013-10-24 DIAGNOSIS — M47817 Spondylosis without myelopathy or radiculopathy, lumbosacral region: Secondary | ICD-10-CM | POA: Diagnosis not present

## 2013-10-24 DIAGNOSIS — R29898 Other symptoms and signs involving the musculoskeletal system: Secondary | ICD-10-CM | POA: Diagnosis not present

## 2013-10-24 DIAGNOSIS — M5126 Other intervertebral disc displacement, lumbar region: Secondary | ICD-10-CM | POA: Diagnosis not present

## 2013-10-26 DIAGNOSIS — M542 Cervicalgia: Secondary | ICD-10-CM | POA: Diagnosis not present

## 2013-10-31 DIAGNOSIS — M542 Cervicalgia: Secondary | ICD-10-CM | POA: Diagnosis not present

## 2013-11-02 DIAGNOSIS — M542 Cervicalgia: Secondary | ICD-10-CM | POA: Diagnosis not present

## 2013-11-07 DIAGNOSIS — M542 Cervicalgia: Secondary | ICD-10-CM | POA: Diagnosis not present

## 2013-11-09 DIAGNOSIS — M542 Cervicalgia: Secondary | ICD-10-CM | POA: Diagnosis not present

## 2013-11-13 DIAGNOSIS — M542 Cervicalgia: Secondary | ICD-10-CM | POA: Diagnosis not present

## 2013-11-14 DIAGNOSIS — M545 Low back pain, unspecified: Secondary | ICD-10-CM | POA: Diagnosis not present

## 2013-11-16 DIAGNOSIS — M542 Cervicalgia: Secondary | ICD-10-CM | POA: Diagnosis not present

## 2013-11-20 DIAGNOSIS — M545 Low back pain, unspecified: Secondary | ICD-10-CM | POA: Diagnosis not present

## 2013-11-20 DIAGNOSIS — M6281 Muscle weakness (generalized): Secondary | ICD-10-CM | POA: Diagnosis not present

## 2013-11-20 DIAGNOSIS — IMO0002 Reserved for concepts with insufficient information to code with codable children: Secondary | ICD-10-CM | POA: Diagnosis not present

## 2013-11-22 DIAGNOSIS — M545 Low back pain, unspecified: Secondary | ICD-10-CM | POA: Diagnosis not present

## 2013-11-28 DIAGNOSIS — M542 Cervicalgia: Secondary | ICD-10-CM | POA: Diagnosis not present

## 2013-11-29 DIAGNOSIS — IMO0002 Reserved for concepts with insufficient information to code with codable children: Secondary | ICD-10-CM | POA: Diagnosis not present

## 2013-12-04 DIAGNOSIS — M5030 Other cervical disc degeneration, unspecified cervical region: Secondary | ICD-10-CM | POA: Diagnosis not present

## 2013-12-04 DIAGNOSIS — R29898 Other symptoms and signs involving the musculoskeletal system: Secondary | ICD-10-CM | POA: Diagnosis not present

## 2013-12-14 DIAGNOSIS — M542 Cervicalgia: Secondary | ICD-10-CM | POA: Diagnosis not present

## 2013-12-21 DIAGNOSIS — F329 Major depressive disorder, single episode, unspecified: Secondary | ICD-10-CM | POA: Diagnosis not present

## 2013-12-21 DIAGNOSIS — M6281 Muscle weakness (generalized): Secondary | ICD-10-CM | POA: Diagnosis not present

## 2013-12-21 DIAGNOSIS — G959 Disease of spinal cord, unspecified: Secondary | ICD-10-CM | POA: Diagnosis not present

## 2013-12-29 DIAGNOSIS — F4310 Post-traumatic stress disorder, unspecified: Secondary | ICD-10-CM | POA: Diagnosis not present

## 2014-01-01 DIAGNOSIS — R5383 Other fatigue: Secondary | ICD-10-CM | POA: Diagnosis not present

## 2014-01-01 DIAGNOSIS — Z1389 Encounter for screening for other disorder: Secondary | ICD-10-CM | POA: Diagnosis not present

## 2014-01-01 DIAGNOSIS — R002 Palpitations: Secondary | ICD-10-CM | POA: Diagnosis not present

## 2014-01-01 DIAGNOSIS — Z0000 Encounter for general adult medical examination without abnormal findings: Secondary | ICD-10-CM | POA: Diagnosis not present

## 2014-01-01 DIAGNOSIS — M4712 Other spondylosis with myelopathy, cervical region: Secondary | ICD-10-CM | POA: Diagnosis not present

## 2014-01-01 DIAGNOSIS — R001 Bradycardia, unspecified: Secondary | ICD-10-CM | POA: Diagnosis not present

## 2014-01-02 DIAGNOSIS — M5416 Radiculopathy, lumbar region: Secondary | ICD-10-CM | POA: Diagnosis not present

## 2014-01-08 DIAGNOSIS — F4310 Post-traumatic stress disorder, unspecified: Secondary | ICD-10-CM | POA: Diagnosis not present

## 2014-01-09 DIAGNOSIS — Z780 Asymptomatic menopausal state: Secondary | ICD-10-CM | POA: Diagnosis not present

## 2014-01-09 DIAGNOSIS — G959 Disease of spinal cord, unspecified: Secondary | ICD-10-CM | POA: Diagnosis not present

## 2014-01-09 DIAGNOSIS — F329 Major depressive disorder, single episode, unspecified: Secondary | ICD-10-CM | POA: Diagnosis not present

## 2014-01-09 DIAGNOSIS — M6281 Muscle weakness (generalized): Secondary | ICD-10-CM | POA: Diagnosis not present

## 2014-01-25 DIAGNOSIS — F4310 Post-traumatic stress disorder, unspecified: Secondary | ICD-10-CM | POA: Diagnosis not present

## 2014-02-05 DIAGNOSIS — F4310 Post-traumatic stress disorder, unspecified: Secondary | ICD-10-CM | POA: Diagnosis not present

## 2014-02-22 DIAGNOSIS — F329 Major depressive disorder, single episode, unspecified: Secondary | ICD-10-CM | POA: Diagnosis not present

## 2014-02-22 DIAGNOSIS — F4310 Post-traumatic stress disorder, unspecified: Secondary | ICD-10-CM | POA: Diagnosis not present

## 2014-03-01 DIAGNOSIS — F329 Major depressive disorder, single episode, unspecified: Secondary | ICD-10-CM | POA: Diagnosis not present

## 2014-03-01 DIAGNOSIS — F4310 Post-traumatic stress disorder, unspecified: Secondary | ICD-10-CM | POA: Diagnosis not present

## 2014-03-21 DIAGNOSIS — Z803 Family history of malignant neoplasm of breast: Secondary | ICD-10-CM | POA: Diagnosis not present

## 2014-03-21 DIAGNOSIS — Z1231 Encounter for screening mammogram for malignant neoplasm of breast: Secondary | ICD-10-CM | POA: Diagnosis not present

## 2014-03-29 DIAGNOSIS — F4310 Post-traumatic stress disorder, unspecified: Secondary | ICD-10-CM | POA: Diagnosis not present

## 2014-03-29 DIAGNOSIS — F329 Major depressive disorder, single episode, unspecified: Secondary | ICD-10-CM | POA: Diagnosis not present

## 2014-04-05 DIAGNOSIS — M4806 Spinal stenosis, lumbar region: Secondary | ICD-10-CM | POA: Diagnosis not present

## 2014-04-05 DIAGNOSIS — M5416 Radiculopathy, lumbar region: Secondary | ICD-10-CM | POA: Diagnosis not present

## 2014-04-18 DIAGNOSIS — F329 Major depressive disorder, single episode, unspecified: Secondary | ICD-10-CM | POA: Diagnosis not present

## 2014-04-18 DIAGNOSIS — F4310 Post-traumatic stress disorder, unspecified: Secondary | ICD-10-CM | POA: Diagnosis not present

## 2014-04-23 DIAGNOSIS — M2578 Osteophyte, vertebrae: Secondary | ICD-10-CM | POA: Diagnosis not present

## 2014-04-23 DIAGNOSIS — M5416 Radiculopathy, lumbar region: Secondary | ICD-10-CM | POA: Diagnosis not present

## 2014-04-23 DIAGNOSIS — M4806 Spinal stenosis, lumbar region: Secondary | ICD-10-CM | POA: Diagnosis not present

## 2014-04-23 DIAGNOSIS — M6281 Muscle weakness (generalized): Secondary | ICD-10-CM | POA: Diagnosis not present

## 2014-04-26 DIAGNOSIS — M4806 Spinal stenosis, lumbar region: Secondary | ICD-10-CM | POA: Diagnosis not present

## 2014-04-26 DIAGNOSIS — M47816 Spondylosis without myelopathy or radiculopathy, lumbar region: Secondary | ICD-10-CM | POA: Diagnosis not present

## 2014-04-26 DIAGNOSIS — M5416 Radiculopathy, lumbar region: Secondary | ICD-10-CM | POA: Diagnosis not present

## 2014-05-04 DIAGNOSIS — M9943 Connective tissue stenosis of neural canal of lumbar region: Secondary | ICD-10-CM | POA: Diagnosis not present

## 2014-05-07 DIAGNOSIS — E041 Nontoxic single thyroid nodule: Secondary | ICD-10-CM | POA: Diagnosis present

## 2014-05-07 DIAGNOSIS — R32 Unspecified urinary incontinence: Secondary | ICD-10-CM | POA: Diagnosis present

## 2014-05-07 DIAGNOSIS — Z79899 Other long term (current) drug therapy: Secondary | ICD-10-CM | POA: Diagnosis not present

## 2014-05-07 DIAGNOSIS — M4316 Spondylolisthesis, lumbar region: Secondary | ICD-10-CM | POA: Diagnosis not present

## 2014-05-07 DIAGNOSIS — M5416 Radiculopathy, lumbar region: Secondary | ICD-10-CM | POA: Diagnosis present

## 2014-05-07 DIAGNOSIS — Z87891 Personal history of nicotine dependence: Secondary | ICD-10-CM | POA: Diagnosis not present

## 2014-05-07 DIAGNOSIS — D62 Acute posthemorrhagic anemia: Secondary | ICD-10-CM | POA: Diagnosis not present

## 2014-05-07 DIAGNOSIS — Z981 Arthrodesis status: Secondary | ICD-10-CM | POA: Diagnosis not present

## 2014-05-07 DIAGNOSIS — M4806 Spinal stenosis, lumbar region: Secondary | ICD-10-CM | POA: Diagnosis not present

## 2014-05-07 DIAGNOSIS — K219 Gastro-esophageal reflux disease without esophagitis: Secondary | ICD-10-CM | POA: Diagnosis present

## 2014-05-07 DIAGNOSIS — Z90711 Acquired absence of uterus with remaining cervical stump: Secondary | ICD-10-CM | POA: Diagnosis present

## 2014-05-13 DIAGNOSIS — M5136 Other intervertebral disc degeneration, lumbar region: Secondary | ICD-10-CM | POA: Diagnosis not present

## 2014-05-13 DIAGNOSIS — M4806 Spinal stenosis, lumbar region: Secondary | ICD-10-CM | POA: Diagnosis not present

## 2014-05-13 DIAGNOSIS — Z4789 Encounter for other orthopedic aftercare: Secondary | ICD-10-CM | POA: Diagnosis not present

## 2014-05-13 DIAGNOSIS — K219 Gastro-esophageal reflux disease without esophagitis: Secondary | ICD-10-CM | POA: Diagnosis not present

## 2014-05-13 DIAGNOSIS — M4316 Spondylolisthesis, lumbar region: Secondary | ICD-10-CM | POA: Diagnosis not present

## 2014-05-15 DIAGNOSIS — M4316 Spondylolisthesis, lumbar region: Secondary | ICD-10-CM | POA: Diagnosis not present

## 2014-05-15 DIAGNOSIS — Z4789 Encounter for other orthopedic aftercare: Secondary | ICD-10-CM | POA: Diagnosis not present

## 2014-05-15 DIAGNOSIS — K219 Gastro-esophageal reflux disease without esophagitis: Secondary | ICD-10-CM | POA: Diagnosis not present

## 2014-05-15 DIAGNOSIS — M4806 Spinal stenosis, lumbar region: Secondary | ICD-10-CM | POA: Diagnosis not present

## 2014-05-15 DIAGNOSIS — M5136 Other intervertebral disc degeneration, lumbar region: Secondary | ICD-10-CM | POA: Diagnosis not present

## 2014-05-18 DIAGNOSIS — M4806 Spinal stenosis, lumbar region: Secondary | ICD-10-CM | POA: Diagnosis not present

## 2014-05-18 DIAGNOSIS — M4316 Spondylolisthesis, lumbar region: Secondary | ICD-10-CM | POA: Diagnosis not present

## 2014-05-18 DIAGNOSIS — Z4789 Encounter for other orthopedic aftercare: Secondary | ICD-10-CM | POA: Diagnosis not present

## 2014-05-18 DIAGNOSIS — K219 Gastro-esophageal reflux disease without esophagitis: Secondary | ICD-10-CM | POA: Diagnosis not present

## 2014-05-18 DIAGNOSIS — M5136 Other intervertebral disc degeneration, lumbar region: Secondary | ICD-10-CM | POA: Diagnosis not present

## 2014-05-21 DIAGNOSIS — M4806 Spinal stenosis, lumbar region: Secondary | ICD-10-CM | POA: Diagnosis not present

## 2014-05-21 DIAGNOSIS — M5136 Other intervertebral disc degeneration, lumbar region: Secondary | ICD-10-CM | POA: Diagnosis not present

## 2014-05-21 DIAGNOSIS — Z4789 Encounter for other orthopedic aftercare: Secondary | ICD-10-CM | POA: Diagnosis not present

## 2014-05-21 DIAGNOSIS — K219 Gastro-esophageal reflux disease without esophagitis: Secondary | ICD-10-CM | POA: Diagnosis not present

## 2014-05-21 DIAGNOSIS — M4316 Spondylolisthesis, lumbar region: Secondary | ICD-10-CM | POA: Diagnosis not present

## 2014-05-23 DIAGNOSIS — M25562 Pain in left knee: Secondary | ICD-10-CM | POA: Diagnosis not present

## 2014-05-23 DIAGNOSIS — M4326 Fusion of spine, lumbar region: Secondary | ICD-10-CM | POA: Diagnosis not present

## 2014-05-23 DIAGNOSIS — M25762 Osteophyte, left knee: Secondary | ICD-10-CM | POA: Diagnosis not present

## 2014-05-24 DIAGNOSIS — M4316 Spondylolisthesis, lumbar region: Secondary | ICD-10-CM | POA: Diagnosis not present

## 2014-05-24 DIAGNOSIS — Z4789 Encounter for other orthopedic aftercare: Secondary | ICD-10-CM | POA: Diagnosis not present

## 2014-05-24 DIAGNOSIS — M4806 Spinal stenosis, lumbar region: Secondary | ICD-10-CM | POA: Diagnosis not present

## 2014-05-24 DIAGNOSIS — M5136 Other intervertebral disc degeneration, lumbar region: Secondary | ICD-10-CM | POA: Diagnosis not present

## 2014-05-24 DIAGNOSIS — K219 Gastro-esophageal reflux disease without esophagitis: Secondary | ICD-10-CM | POA: Diagnosis not present

## 2014-05-25 DIAGNOSIS — Z4789 Encounter for other orthopedic aftercare: Secondary | ICD-10-CM | POA: Diagnosis not present

## 2014-05-25 DIAGNOSIS — M4316 Spondylolisthesis, lumbar region: Secondary | ICD-10-CM | POA: Diagnosis not present

## 2014-05-25 DIAGNOSIS — K219 Gastro-esophageal reflux disease without esophagitis: Secondary | ICD-10-CM | POA: Diagnosis not present

## 2014-05-25 DIAGNOSIS — M4806 Spinal stenosis, lumbar region: Secondary | ICD-10-CM | POA: Diagnosis not present

## 2014-05-25 DIAGNOSIS — M5136 Other intervertebral disc degeneration, lumbar region: Secondary | ICD-10-CM | POA: Diagnosis not present

## 2014-05-30 DIAGNOSIS — K219 Gastro-esophageal reflux disease without esophagitis: Secondary | ICD-10-CM | POA: Diagnosis not present

## 2014-05-30 DIAGNOSIS — Z4789 Encounter for other orthopedic aftercare: Secondary | ICD-10-CM | POA: Diagnosis not present

## 2014-05-30 DIAGNOSIS — M5136 Other intervertebral disc degeneration, lumbar region: Secondary | ICD-10-CM | POA: Diagnosis not present

## 2014-05-30 DIAGNOSIS — M4806 Spinal stenosis, lumbar region: Secondary | ICD-10-CM | POA: Diagnosis not present

## 2014-05-30 DIAGNOSIS — M4316 Spondylolisthesis, lumbar region: Secondary | ICD-10-CM | POA: Diagnosis not present

## 2014-06-01 DIAGNOSIS — M4806 Spinal stenosis, lumbar region: Secondary | ICD-10-CM | POA: Diagnosis not present

## 2014-06-01 DIAGNOSIS — K219 Gastro-esophageal reflux disease without esophagitis: Secondary | ICD-10-CM | POA: Diagnosis not present

## 2014-06-01 DIAGNOSIS — M5136 Other intervertebral disc degeneration, lumbar region: Secondary | ICD-10-CM | POA: Diagnosis not present

## 2014-06-01 DIAGNOSIS — M4316 Spondylolisthesis, lumbar region: Secondary | ICD-10-CM | POA: Diagnosis not present

## 2014-06-01 DIAGNOSIS — Z4789 Encounter for other orthopedic aftercare: Secondary | ICD-10-CM | POA: Diagnosis not present

## 2014-06-04 DIAGNOSIS — K219 Gastro-esophageal reflux disease without esophagitis: Secondary | ICD-10-CM | POA: Diagnosis not present

## 2014-06-04 DIAGNOSIS — M5136 Other intervertebral disc degeneration, lumbar region: Secondary | ICD-10-CM | POA: Diagnosis not present

## 2014-06-04 DIAGNOSIS — Z4789 Encounter for other orthopedic aftercare: Secondary | ICD-10-CM | POA: Diagnosis not present

## 2014-06-04 DIAGNOSIS — M4316 Spondylolisthesis, lumbar region: Secondary | ICD-10-CM | POA: Diagnosis not present

## 2014-06-04 DIAGNOSIS — M4806 Spinal stenosis, lumbar region: Secondary | ICD-10-CM | POA: Diagnosis not present

## 2014-06-06 DIAGNOSIS — M4316 Spondylolisthesis, lumbar region: Secondary | ICD-10-CM | POA: Diagnosis not present

## 2014-06-06 DIAGNOSIS — M4806 Spinal stenosis, lumbar region: Secondary | ICD-10-CM | POA: Diagnosis not present

## 2014-06-06 DIAGNOSIS — Z4789 Encounter for other orthopedic aftercare: Secondary | ICD-10-CM | POA: Diagnosis not present

## 2014-06-06 DIAGNOSIS — K219 Gastro-esophageal reflux disease without esophagitis: Secondary | ICD-10-CM | POA: Diagnosis not present

## 2014-06-06 DIAGNOSIS — M5136 Other intervertebral disc degeneration, lumbar region: Secondary | ICD-10-CM | POA: Diagnosis not present

## 2014-06-14 DIAGNOSIS — M4806 Spinal stenosis, lumbar region: Secondary | ICD-10-CM | POA: Diagnosis not present

## 2014-06-14 DIAGNOSIS — M4316 Spondylolisthesis, lumbar region: Secondary | ICD-10-CM | POA: Diagnosis not present

## 2014-06-14 DIAGNOSIS — Z4789 Encounter for other orthopedic aftercare: Secondary | ICD-10-CM | POA: Diagnosis not present

## 2014-06-14 DIAGNOSIS — M5136 Other intervertebral disc degeneration, lumbar region: Secondary | ICD-10-CM | POA: Diagnosis not present

## 2014-06-14 DIAGNOSIS — K219 Gastro-esophageal reflux disease without esophagitis: Secondary | ICD-10-CM | POA: Diagnosis not present

## 2014-06-15 DIAGNOSIS — M4806 Spinal stenosis, lumbar region: Secondary | ICD-10-CM | POA: Diagnosis not present

## 2014-06-15 DIAGNOSIS — Z4789 Encounter for other orthopedic aftercare: Secondary | ICD-10-CM | POA: Diagnosis not present

## 2014-06-15 DIAGNOSIS — K219 Gastro-esophageal reflux disease without esophagitis: Secondary | ICD-10-CM | POA: Diagnosis not present

## 2014-06-15 DIAGNOSIS — M4316 Spondylolisthesis, lumbar region: Secondary | ICD-10-CM | POA: Diagnosis not present

## 2014-06-15 DIAGNOSIS — M5136 Other intervertebral disc degeneration, lumbar region: Secondary | ICD-10-CM | POA: Diagnosis not present

## 2014-06-21 DIAGNOSIS — M4316 Spondylolisthesis, lumbar region: Secondary | ICD-10-CM | POA: Diagnosis not present

## 2014-06-21 DIAGNOSIS — K219 Gastro-esophageal reflux disease without esophagitis: Secondary | ICD-10-CM | POA: Diagnosis not present

## 2014-06-21 DIAGNOSIS — Z4789 Encounter for other orthopedic aftercare: Secondary | ICD-10-CM | POA: Diagnosis not present

## 2014-06-21 DIAGNOSIS — M4806 Spinal stenosis, lumbar region: Secondary | ICD-10-CM | POA: Diagnosis not present

## 2014-06-21 DIAGNOSIS — M5136 Other intervertebral disc degeneration, lumbar region: Secondary | ICD-10-CM | POA: Diagnosis not present

## 2014-06-28 DIAGNOSIS — M4326 Fusion of spine, lumbar region: Secondary | ICD-10-CM | POA: Diagnosis not present

## 2014-06-29 DIAGNOSIS — M5136 Other intervertebral disc degeneration, lumbar region: Secondary | ICD-10-CM | POA: Diagnosis not present

## 2014-06-29 DIAGNOSIS — M4316 Spondylolisthesis, lumbar region: Secondary | ICD-10-CM | POA: Diagnosis not present

## 2014-06-29 DIAGNOSIS — K219 Gastro-esophageal reflux disease without esophagitis: Secondary | ICD-10-CM | POA: Diagnosis not present

## 2014-06-29 DIAGNOSIS — Z4789 Encounter for other orthopedic aftercare: Secondary | ICD-10-CM | POA: Diagnosis not present

## 2014-06-29 DIAGNOSIS — M4806 Spinal stenosis, lumbar region: Secondary | ICD-10-CM | POA: Diagnosis not present

## 2014-07-02 DIAGNOSIS — F4310 Post-traumatic stress disorder, unspecified: Secondary | ICD-10-CM | POA: Diagnosis not present

## 2014-07-02 DIAGNOSIS — F329 Major depressive disorder, single episode, unspecified: Secondary | ICD-10-CM | POA: Diagnosis not present

## 2014-07-09 DIAGNOSIS — M79605 Pain in left leg: Secondary | ICD-10-CM | POA: Diagnosis not present

## 2014-07-09 DIAGNOSIS — M6281 Muscle weakness (generalized): Secondary | ICD-10-CM | POA: Diagnosis not present

## 2014-07-09 DIAGNOSIS — F329 Major depressive disorder, single episode, unspecified: Secondary | ICD-10-CM | POA: Diagnosis not present

## 2014-07-09 DIAGNOSIS — G959 Disease of spinal cord, unspecified: Secondary | ICD-10-CM | POA: Diagnosis not present

## 2014-07-16 DIAGNOSIS — F4310 Post-traumatic stress disorder, unspecified: Secondary | ICD-10-CM | POA: Diagnosis not present

## 2014-07-16 DIAGNOSIS — F329 Major depressive disorder, single episode, unspecified: Secondary | ICD-10-CM | POA: Diagnosis not present

## 2014-07-23 ENCOUNTER — Ambulatory Visit: Payer: Medicare Other | Attending: Physician Assistant | Admitting: Physical Therapy

## 2014-07-23 DIAGNOSIS — M545 Low back pain, unspecified: Secondary | ICD-10-CM

## 2014-07-23 DIAGNOSIS — M2560 Stiffness of unspecified joint, not elsewhere classified: Secondary | ICD-10-CM

## 2014-07-23 DIAGNOSIS — R29898 Other symptoms and signs involving the musculoskeletal system: Secondary | ICD-10-CM | POA: Diagnosis not present

## 2014-07-23 DIAGNOSIS — M5386 Other specified dorsopathies, lumbar region: Secondary | ICD-10-CM

## 2014-07-23 NOTE — Therapy (Signed)
[place], [place], [address] Phone: [phone]   Fax:  [phone]  Physical Therapy Evaluation  Patient Details  Name: [name] MRN: [id number] Date of Birth: Dec 25, 1945 Referring Provider:  Erline Hau, PA-C  Encounter Date: 07/23/2014      PT End of Session - 07/23/14 1250    Visit Number 1   Number of Visits 16   Date for PT Re-Evaluation 09/22/14   PT Start Time 1100   PT Stop Time 2119   PT Time Calculation (min) 48 min   Activity Tolerance Patient tolerated treatment well;Patient limited by pain;Patient limited by fatigue   Behavior During Therapy [facility] for tasks assessed/performed      Past Medical History  Diagnosis Date  . Arthritis     Past Surgical History  Procedure Laterality Date  . Neck surgery  2010    cerv disc fused   . Colonoscopy    . Bunionectomy  2013    rt foot  . Abdominal hysterectomy  1995  . Muscle biopsy Left 10/03/2012    Procedure: LEFT QUADRICEP MUSCLE BIOPSY;  Surgeon: Odis Hollingshead, MD;  Location: [facility];  Service: General;  Laterality: Left;    There were no vitals filed for this visit.  Visit Diagnosis:  Bilateral low back pain without sciatica - Plan: PT plan of care cert/re-cert  Decreased ROM of lumbar spine - Plan: PT plan of care cert/re-cert  Weakness of both legs - Plan: PT plan of care cert/re-cert  Joint stiffness of spine - Plan: PT plan of care cert/re-cert      Subjective Assessment - 07/23/14 1109    Subjective pt is a 69 y.o F with CC of low back pain following lumbar fusion that was performed in February of 2016. She reports intermittent pain that stays in the back, with reported numbness on the lateral aspect of the left knee, and ankle.    Limitations Sitting;Lifting;Standing;Walking;House hold activities   How long can you sit comfortably? depends on where shes sitting on avg 20 min   How long can you  stand comfortably? 5 min   How long can you walk comfortably? 5 min   Diagnostic tests November prior to surgery indicating OA and degenerative disc disease.    Patient Stated Goals to walk again without the RW to use LRAD, To be pain free.    Currently in Pain? Yes   Pain Score 6    Pain Location Back   Pain Orientation Right;Lower   Pain Descriptors / Indicators Sharp   Pain Type Surgical pain   Pain Onset More than a month ago   Pain Frequency Intermittent   Aggravating Factors  standing, walking, bending and twisting.    Pain Relieving Factors pain medication, get into comfortable position.    Effect of Pain on Daily Activities limited endurance            [facility] PT Assessment - 07/23/14 1115    Assessment   Medical Diagnosis s/p lumbar fusion   Onset Date --  February 2016   Next MD Visit 09/09/2014   Prior Therapy yes  Left shoulder and neck   Precautions   Precautions Back   Precaution Comments no bending, lifting over 5#, or twisting.    Required Braces or Orthoses Spinal Brace   Spinal Brace Lumbar corset   Restrictions   Weight Bearing Restrictions No   Balance Screen   Has the  patient fallen in the past 6 months No   Has the patient had a decrease in activity level because of a fear of falling?  No   Is the patient reluctant to leave their home because of a fear of falling?  No   Home Environment   Living Enviornment Private residence   Available Help at Discharge Available 24 hours/day   Type of [place] to enter   Entrance Stairs-Number of Steps 1   Entrance Stairs-Rails None   Home Layout One level   [place] - 2 wheels;Cane - single point   Prior Function   Level of Independence Independent with basic ADLs;Independent with homemaking with ambulation;Independent with gait;Independent with transfers   Vocation Retired   Visual merchandiser.    Observation/Other Assessments   Focus on Therapeutic Outcomes (FOTO)   76% limited   projected to 56%   Posture/Postural Control   Posture/Postural Control Postural limitations   Postural Limitations Rounded Shoulders;Forward head   ROM / Strength   AROM / PROM / Strength AROM;Strength   AROM   AROM Assessment Site Lumbar   Lumbar Flexion 30   Lumbar Extension 10   Lumbar - Right Side Bend --  not tested today   Lumbar - Left Side Bend --  not tested today   Lumbar - Right Rotation --  not tested today   Lumbar - Left Rotation --  not tested today   Strength   Right Hip Flexion 3-/5   Right Hip Extension 3-/5   Right Hip ABduction 3-/5   Right Hip ADduction 3-/5   Left Hip Flexion 3+/5   Left Hip Extension 3+/5   Left Hip ABduction 3+/5   Left Hip ADduction 3+/5   Right Knee Flexion 3-/5   Right Knee Extension 3-/5   Left Knee Flexion 3+/5   Left Knee Extension 3+/5   Flexibility   Soft Tissue Assessment /Muscle Length yes   Hamstrings --  L 38, R 20   Transfers   Transfers --  supine <> sit Mod +1    Ambulation/Gait   Ambulation/Gait Yes   Gait Pattern Antalgic;Left genu recurvatum;Right genu recurvatum;Decreased hip/knee flexion - right;Decreased hip/knee flexion - left;Decreased step length - right;Decreased step length - left;Step-to pattern   Ambulation Surface Level                   [facility] Adult PT Treatment/Exercise - 07/23/14 1115    Lumbar Exercises: Stretches   Passive Hamstring Stretch 2 reps;30 seconds   Lower Trunk Rotation 5 reps;20 seconds   Lumbar Exercises: Supine   Other Supine Lumbar Exercises quad set 2 x 10   3 sec hold   Other Supine Lumbar Exercises heel slides 2 x 4 ea.                PT Education - 07/23/14 1249    Education provided Yes   Education Details evaluation findings, POC, Goals, HEP, anatomical education    Person(s) Educated Patient   Methods Explanation   Comprehension Verbalized understanding          PT Short Term Goals - 07/23/14 1256    PT SHORT TERM GOAL #1    Title pt will be I with basic HEP 08/23/2014   Time 4   Period Weeks   Status New   PT SHORT TERM GOAL #2   Title pt will increase overall trunk mobilty by 10 degrees in all  planes to assist with ADLs 08/23/2014   Time 4   Period Weeks   Status New   PT SHORT TERM GOAL #3   Title pt will increase Bil LE strength to 3/5 to help promote safety during ambulatory activities. 08/23/2014   Time 4   Period Weeks   Status New   PT SHORT TERM GOAL #4   Title pt will decrease pain to <6/10 during and following standing / walking for >10 minutes to assist with endurance and safety during weight bearing activities 08/23/2014   Time 4   Period Weeks   Status New   PT SHORT TERM GOAL #5   Title pt will increase FOTO score by >8 points to assist with functional capacity 08/23/2014)   Time 4   Period Weeks   Status New   Additional Short Term Goals   Additional Short Term Goals Yes   PT SHORT TERM GOAL #6   Title pt will be able to verbalize and demonstrate proper transfer techniques from supine <>sit and bed mobility to help protect the lumbar spine and derease pain 08/23/2014   Time 4   Period Weeks   Status New           PT Long Term Goals - 07/23/14 1259    PT LONG TERM GOAL #1   Title pt will be I with Advanced HEP 09/22/2014   Time 8   Period Weeks   Status New   PT LONG TERM GOAL #2   Title pt will increase trunk mobility by > 20 degrees in all planes to assist wtih safety during driving and ADLS. 10/22/4233   Time 8   Period Weeks   Status New   PT LONG TERM GOAL #3   Title pt will increase bil LE strength to >4/5 strength to help with climbing >10 steps and safety using LRAD during weight bearing activities 09/22/2014   Time 8   Period Weeks   Status New   PT LONG TERM GOAL #4   Title pt will demonstrate <4/10 pain during standing / walking for >20 minutes to help with endurance and ADLs 09/22/2014   Time 8   Period Weeks   Status New   PT LONG TERM GOAL #5   Title pt will be able  to verbalize and demonstrate technqiues to reduce risk of reinjury of the low back by postural awareness, lifting and carrying mechanics, and HEP 09/22/2014   Time 8   Period Weeks   Status New               Plan - 07/23/14 1250    Clinical Impression Statement Doshia presents to OPPT with CC of low back pain following lumbar fusion performed in February of 2016. She currently ambulates with a RW with an antalgic gait pattern with bil knee hyperextension and bil toe out gait. She dmeonstrates limited trunk mobility in all planes with pain during and at end of movement. She has weakness bil with R LE worse at 3-/5 with all LE MMT, and 3+/5 on the L with all LE MMT. when going from sitting to laying she utlized log roll method, but going from laying to sit she tried to sit up without log roll and demonstrated immediate pain, and re-educated how to get up and required mod assist +1 to sit up. She would benefit from skilled physical therapy to maximize her function and decrease pain by addressing the impairments listed.    Pt will benefit  from skilled therapeutic intervention in order to improve on the following deficits Abnormal gait;Impaired flexibility;Improper body mechanics;Postural dysfunction;Difficulty walking;Decreased safety awareness;Decreased balance;Increased muscle spasms;Decreased strength;Decreased range of motion;Decreased endurance;Decreased activity tolerance;Decreased mobility   Rehab Potential Good   PT Frequency 2x / week   PT Duration 8 weeks   PT Treatment/Interventions ADLs/Self Care Home Management;Electrical Stimulation;Gait training;Therapeutic exercise;Patient/family education;Balance training;Stair training;Moist Heat;Manual techniques;Neuromuscular re-education;Functional mobility training;Cryotherapy;Ultrasound;DME Instruction;Therapeutic activities;Dry needling;Passive range of motion   PT Next Visit Plan assess response to HEP, Bil LE strengthening, modalites for pain,  lumbar AROM   PT Home Exercise Plan see HEP handout   Consulted and Agree with Plan of Care Patient          G-Codes - Aug 20, 2014 1305    Functional Assessment Tool Used FOTO 76 % limited   Functional Limitation Mobility: Walking and moving around   Mobility: Walking and Moving Around Current Status [phone]) At least 60 percent but less than 80 percent impaired, limited or restricted   Mobility: Walking and Moving Around Goal Status [phone]) At least 40 percent but less than 60 percent impaired, limited or restricted       Problem List Patient Active Problem List   Diagnosis Date Noted  . Weakness 11/03/2012   Starr [place] PT, DPT, LAT, ATC  08-20-14  1:11 PM    [facility] [address] [place], [place], [address] Phone: [phone]   Fax:  [phone]

## 2014-07-23 NOTE — Patient Instructions (Signed)
   Derica Leiber PT, DPT, LAT, ATC  [facility] Phone: [phone]     

## 2014-08-01 ENCOUNTER — Ambulatory Visit: Payer: Medicare Other | Admitting: Physical Therapy

## 2014-08-01 DIAGNOSIS — M2560 Stiffness of unspecified joint, not elsewhere classified: Secondary | ICD-10-CM | POA: Diagnosis not present

## 2014-08-01 DIAGNOSIS — R29898 Other symptoms and signs involving the musculoskeletal system: Secondary | ICD-10-CM | POA: Diagnosis not present

## 2014-08-01 DIAGNOSIS — M545 Low back pain: Secondary | ICD-10-CM | POA: Diagnosis not present

## 2014-08-01 NOTE — Therapy (Signed)
[place], [place], [address] Phone: [phone]   Fax:  [phone]  Physical Therapy Treatment  Patient Details  Name: [name] MRN: [id number] Date of Birth: Aug 30, 1945 Referring Provider:  Merrilee Seashore, MD  Encounter Date: 08/01/2014      PT End of Session - 08/01/14 1043    Visit Number 2   Number of Visits 16   Date for PT Re-Evaluation 09/22/14   PT Start Time 1100   PT Stop Time 1148   PT Time Calculation (min) 48 min   Activity Tolerance Patient tolerated treatment well      Past Medical History  Diagnosis Date  . Arthritis     Past Surgical History  Procedure Laterality Date  . Neck surgery  2010    cerv disc fused   . Colonoscopy    . Bunionectomy  2013    rt foot  . Abdominal hysterectomy  1995  . Muscle biopsy Left 10/03/2012    Procedure: LEFT QUADRICEP MUSCLE BIOPSY;  Surgeon: Odis Hollingshead, MD;  Location: [facility];  Service: General;  Laterality: Left;    There were no vitals filed for this visit.  Visit Diagnosis:  Weakness of both legs      Subjective Assessment - 08/01/14 0851    Subjective back stiff/ weak.  Neck 4/10 stiff this    Currently in Pain? Yes   Pain Score 4    Pain Location Neck  back no pain, stiff   Pain Orientation Posterior   Pain Descriptors / Indicators --  stiff   Aggravating Factors  bending twisting    Pain Relieving Factors rest   Effect of Pain on Daily Activities limitede activity   Multiple Pain Sites No                         [facility] Adult PT Treatment/Exercise - 08/01/14 0856    Lumbar Exercises: Aerobic   Stationary Bike Nustep L6, 5 minutes  187 feet   Lumbar Exercises: Standing   Heel Raises 10 reps  holds counter   Functional Squats 10 reps  cued for equal weight shifting   Other Standing Lumbar Exercises Straight leg raise standing (so husband does not have to help)  added  to home exercise   Knee/Hip Exercises: Standing   Forward Step Up Left;1 set;Hand Hold: 2;Step Height: 4"  in parallel bars cues to keep Rt knee flexed , contact guard   Other Standing Knee Exercises Hip side steps with out using hands at counter 10 feet 2 times.  Close SBA    Other Standing Knee Exercises Sit to stand 10 reps from                PT Education - 08/01/14 1042    Education provided Yes   Education Details Standing Hip flexion   Person(s) Educated Patient   Methods Explanation;Demonstration;Verbal cues;Handout   Comprehension Verbalized understanding;Returned demonstration          PT Short Term Goals - 08/01/14 1047    PT SHORT TERM GOAL #1   Time 4   Period Weeks   Status On-going   PT SHORT TERM GOAL #2   Title pt will increase overall trunk mobilty by 10 degrees in all planes to assist with ADLs 08/23/2014   Time 4   Period Weeks   Status On-going   PT SHORT TERM GOAL #3  Title pt will increase Bil LE strength to 3/5 to help promote safety during ambulatory activities. 08/23/2014   Time 4   Period Weeks   Status On-going   PT SHORT TERM GOAL #4   Title pt will decrease pain to <6/10 during and following standing / walking for >10 minutes to assist with endurance and safety during weight bearing activities 08/23/2014   Time 4   Period Weeks   Status On-going   PT SHORT TERM GOAL #5   Title pt will increase FOTO score by >8 points to assist with functional capacity 08/23/2014)   Time 4   Period Weeks   Status Unable to assess   PT SHORT TERM GOAL #6   Title pt will be able to verbalize and demonstrate proper transfer techniques from supine <>sit and bed mobility to help protect the lumbar spine and derease pain 08/23/2014   Time 4   Period Weeks   Status On-going           PT Long Term Goals - 08/01/14 1048    PT LONG TERM GOAL #1   Title pt will be I with Advanced HEP 09/22/2014   Time 8   Period Weeks   Status On-going   PT LONG TERM GOAL  #2   Title pt will increase trunk mobility by > 20 degrees in all planes to assist wtih safety during driving and ADLS. 10/24/6960   Time 8   Period Weeks   Status On-going   PT LONG TERM GOAL #3   Title pt will increase bil LE strength to >4/5 strength to help with climbing >10 steps and safety using LRAD during weight bearing activities 09/22/2014   Time 8   Period Weeks   Status On-going   PT LONG TERM GOAL #4   Title pt will demonstrate <4/10 pain during standing / walking for >20 minutes to help with endurance and ADLs 09/22/2014   Time 8   Period Weeks   Status On-going   PT LONG TERM GOAL #5   Title pt will be able to verbalize and demonstrate technqiues to reduce risk of reinjury of the low back by postural awareness, lifting and carrying mechanics, and HEP 09/22/2014   Time 8   Period Weeks   Status On-going               Plan - 08/01/14 1043    Clinical Impression Statement Patient expresses sadness with her condition.  She needs realistic,.  Pain was not a limiting factor today. positive support.  Exercises today required close SBA and contact guard.  Progress toward home exercise goals.   PT Next Visit Plan Strengthening, bed mobility.  Check calf length   PT Home Exercise Plan try to exercise 1 to 2 times a day   Consulted and Agree with Plan of Care Patient        Problem List Patient Active Problem List   Diagnosis Date Noted  . Weakness 11/03/2012    [name] 08/01/2014, 10:49 AM  [facility] [address] [place], [place], [address] Phone: [phone]   Fax:  [phone]  [name], PTA 08/01/2014 10:49 AM Phone: [phone] Fax: [phone]

## 2014-08-03 ENCOUNTER — Ambulatory Visit: Payer: Medicare Other | Admitting: Physical Therapy

## 2014-08-03 DIAGNOSIS — M2560 Stiffness of unspecified joint, not elsewhere classified: Secondary | ICD-10-CM | POA: Diagnosis not present

## 2014-08-03 DIAGNOSIS — M545 Low back pain, unspecified: Secondary | ICD-10-CM

## 2014-08-03 DIAGNOSIS — R29898 Other symptoms and signs involving the musculoskeletal system: Secondary | ICD-10-CM | POA: Diagnosis not present

## 2014-08-03 DIAGNOSIS — M5386 Other specified dorsopathies, lumbar region: Secondary | ICD-10-CM

## 2014-08-03 NOTE — Patient Instructions (Signed)
   Kristoffer Leamon PT, DPT, LAT, ATC  [facility] Phone: [phone]     

## 2014-08-03 NOTE — Therapy (Signed)
[place], [place], [address] Phone: [phone]   Fax:  [phone]  Physical Therapy Treatment  Patient Details  Name: [name] MRN: [id number] Date of Birth: [birth year] Referring Provider:  Merrilee Seashore, MD  Encounter Date: 08/03/2014      PT End of Session - 08/03/14 1040    Visit Number 3   Number of Visits 16   Date for PT Re-Evaluation 09/22/14   PT Start Time 5427   PT Stop Time 0950   PT Time Calculation (min) 55 min   Activity Tolerance Patient tolerated treatment well   Behavior During Therapy [facility] for tasks assessed/performed      Past Medical History  Diagnosis Date  . Arthritis     Past Surgical History  Procedure Laterality Date  . Neck surgery  2010    cerv disc fused   . Colonoscopy    . Bunionectomy  2013    rt foot  . Abdominal hysterectomy  1995  . Muscle biopsy Left 10/03/2012    Procedure: LEFT QUADRICEP MUSCLE BIOPSY;  Surgeon: Odis Hollingshead, MD;  Location: [facility];  Service: General;  Laterality: Left;    There were no vitals filed for this visit.  Visit Diagnosis:  Weakness of both legs  Bilateral low back pain without sciatica  Decreased ROM of lumbar spine  Joint stiffness of spine      Subjective Assessment - 08/03/14 0855    Subjective "the back isn't feeling to bad today, I am just feeling more tired and am tired of being tired"   Currently in Pain? Yes   Pain Score 3    Pain Location Back   Pain Orientation Right;Left   Pain Descriptors / Indicators Aching   Pain Type Surgical pain   Pain Onset More than a month ago   Pain Frequency Intermittent   Aggravating Factors  bending twisting   Pain Relieving Factors resting            [facility] PT Assessment - 08/03/14 0001    Functional Tests   Functional tests Other   Other:   Other/ Comments supine<> sit 2 x 5 with log rolled  performed to both sides                      [facility] Adult PT Treatment/Exercise - 08/03/14 0857    Lumbar Exercises: Aerobic   Stationary Bike Nustep L6, 6 minutes   Lumbar Exercises: Standing   Heel Raises 10 reps   Functional Squats 10 reps   Lumbar Exercises: Supine   Other Supine Lumbar Exercises quad set 2 x 10, rolling from to each side 2 x 10  VC to perform log roll as on unit   Other Supine Lumbar Exercises hip flexion 2 x 10  wtih physio ball under heels rolling ball back   Modalities   Modalities Moist Heat   Moist Heat Therapy   Number Minutes Moist Heat 10 Minutes   Moist Heat Location Lumbar Spine  in sitting                PT Education - 08/03/14 1038    Education provided Yes   Education Details quad sets, roling in bed (log roll), supine <>sit transfer,    Person(s) Educated Patient   Methods Explanation   Comprehension Verbalized understanding          PT Short Term Goals - 08/03/14 1215  PT SHORT TERM GOAL #1   Title pt will be I with basic HEP 08/23/2014   Time 4   Period Weeks   Status On-going   PT SHORT TERM GOAL #2   Title pt will increase overall trunk mobilty by 10 degrees in all planes to assist with ADLs 08/23/2014   Time 4   Period Weeks   Status On-going   PT SHORT TERM GOAL #3   Title pt will increase Bil LE strength to 3/5 to help promote safety during ambulatory activities. 08/23/2014   Time 4   Period Weeks   Status On-going   PT SHORT TERM GOAL #4   Title pt will decrease pain to <6/10 during and following standing / walking for >10 minutes to assist with endurance and safety during weight bearing activities 08/23/2014   Time 4   Period Weeks   Status On-going   PT SHORT TERM GOAL #5   Title pt will increase FOTO score by >8 points to assist with functional capacity 08/23/2014)   Time 4   Period Weeks   Status On-going   PT SHORT TERM GOAL #6   Title pt will be able to verbalize and demonstrate proper transfer techniques from supine <>sit  and bed mobility to help protect the lumbar spine and derease pain 08/23/2014   Time 4   Period Weeks   Status On-going           PT Long Term Goals - 08/03/14 1043    PT LONG TERM GOAL #1   Title pt will be I with Advanced HEP 09/22/2014   Time 8   Period Weeks   Status On-going   PT LONG TERM GOAL #2   Title pt will increase trunk mobility by > 20 degrees in all planes to assist wtih safety during driving and ADLS. 08/22/7033   Time 8   Period Weeks   Status On-going   PT LONG TERM GOAL #3   Title pt will increase bil LE strength to >4/5 strength to help with climbing >10 steps and safety using LRAD during weight bearing activities 09/22/2014   Time 8   Period Weeks   Status On-going   PT LONG TERM GOAL #4   Title pt will demonstrate <4/10 pain during standing / walking for >20 minutes to help with endurance and ADLs 09/22/2014   Time 8   Period Weeks   Status On-going   PT LONG TERM GOAL #5   Title pt will be able to verbalize and demonstrate technqiues to reduce risk of reinjury of the low back by postural awareness, lifting and carrying mechanics, and HEP 09/22/2014   Time 8   Period Weeks   Status On-going               Plan - 08/03/14 1041    Clinical Impression Statement Dartha presents to therapy today with report of no soreness since the last visit only asking why she seems so tired. pt explained that due to an altered gait pattern in combination with fear of falling resulting in keeping her hands/shoulders tight while holding on to the RW expells alot of energy.  Worked on proper transfers from supine<>sit, and modified HEP quad strengthening to quad set.  Plan to progress with hip strengthening and proper ambulation/transfers as tolerated.    PT Next Visit Plan Strengthening, bed mobility.  Check calf length, supine/ seated strengthening.    PT Home Exercise Plan quad sets, suping<>sit transfers, rolling in  bed.    Consulted and Agree with Plan of Care Patient         Problem List Patient Active Problem List   Diagnosis Date Noted  . Weakness 11/03/2012   Starr Lake PT, DPT, LAT, ATC  08/03/2014  12:21 PM    [facility] [address] [place], [place], [address] Phone: [phone]   Fax:  [phone]

## 2014-08-04 IMAGING — CT CT ABD-PELV W/ CM
4 of 5 series · 13 of 46 positions shown, 19 images · IV contrast (READICAT/WATER & [ID] OMNI 300)
Comparison: None.

CLINICAL DATA: Abdominal pain.  Diffuse weakness.  Difficulty
walking.

CT ABDOMEN AND PELVIS WITH CONTRAST
TECHNIQUE: Multidetector CT imaging of the abdomen and pelvis was
performed following the standard protocol during bolus
administration of intravenous contrast.
Contrast: 125mL OMNIPAQUE IOHEXOL 300 MG/ML  SOLN

[Series 2: chest/abd/pelvis · axial · 0.78mm/px · z∈[-620,-235]mm · 6 of 124 slices shown]
[im 8/124  soft-tissue]
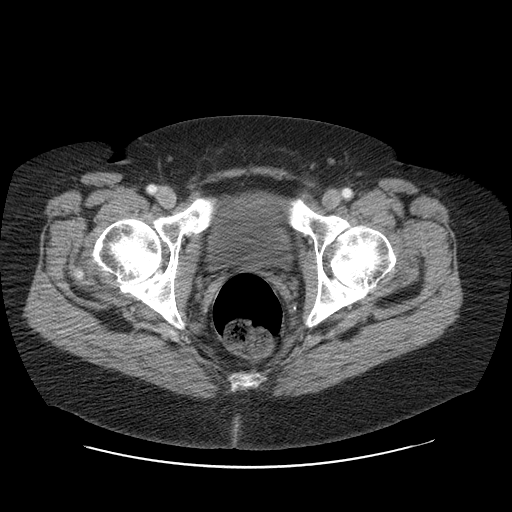
[im 24/124  soft-tissue]
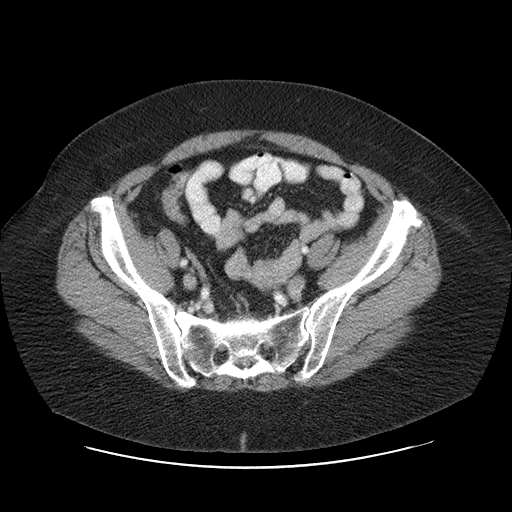
[im 39/124  soft-tissue]
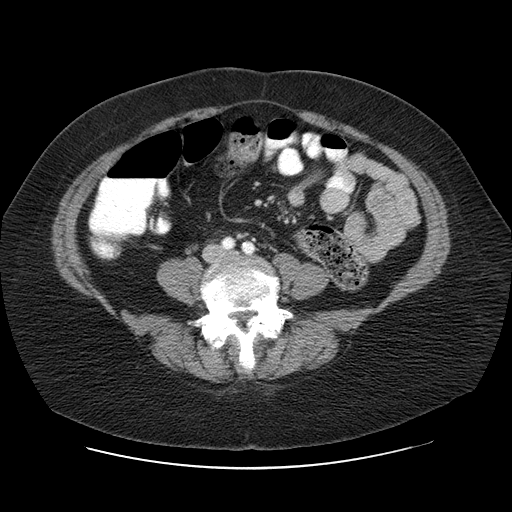
[im 54/124  soft-tissue]
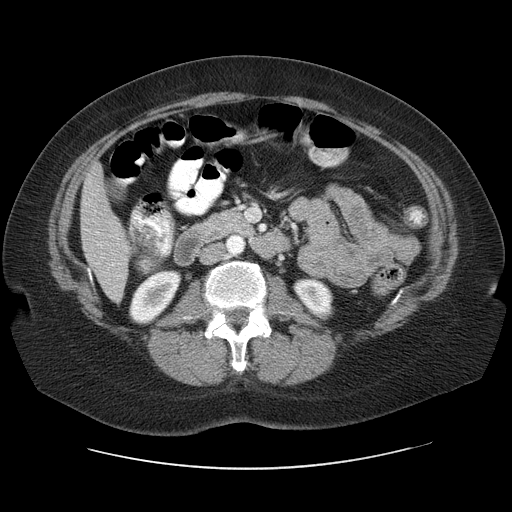
[im 70/124  soft-tissue]
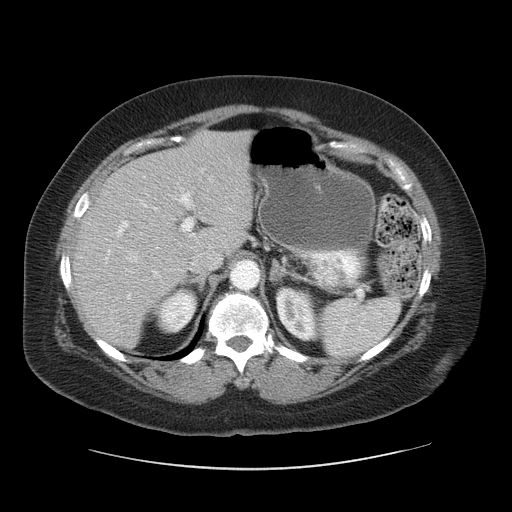
[im 85/124  soft-tissue]
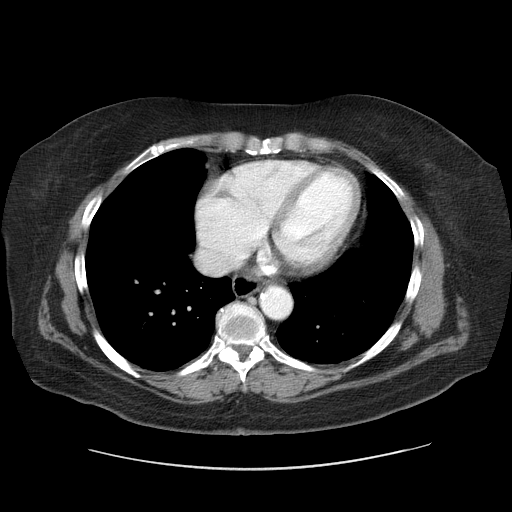

[Series 5: renal delays · axial · 0.82mm/px · z∈[-400,-315]mm · 3 of 35 slices shown, 7 images]
[im 9/35  soft-tissue]
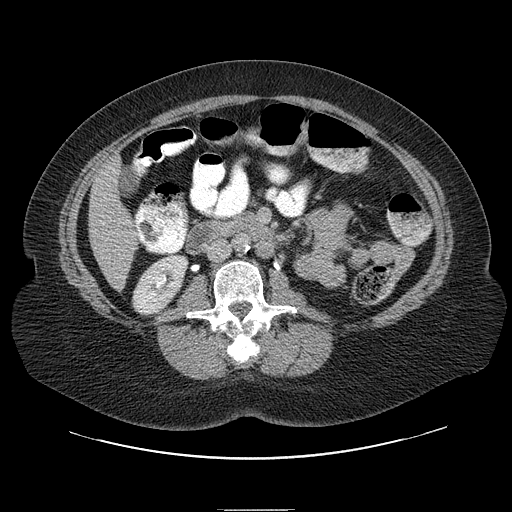
[im 9/35  lung]
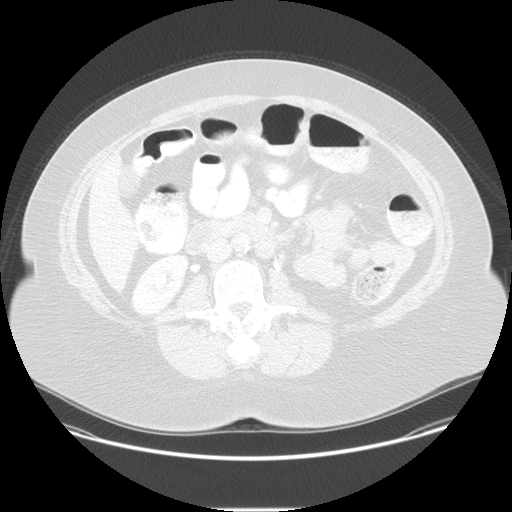
[im 9/35  bone]
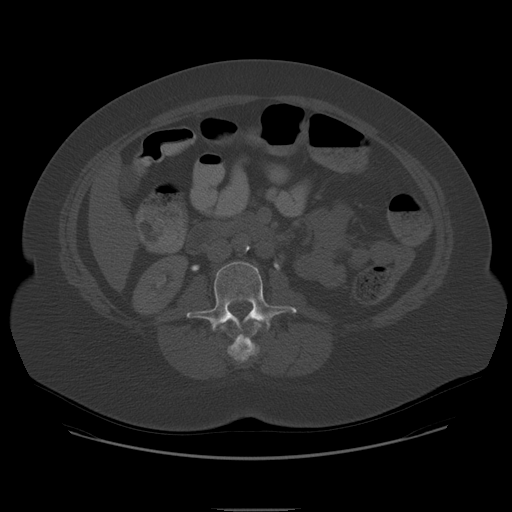
[im 18/35  soft-tissue]
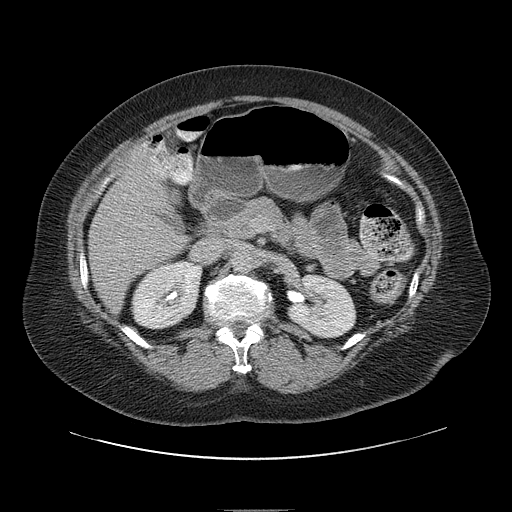
[im 18/35  lung]
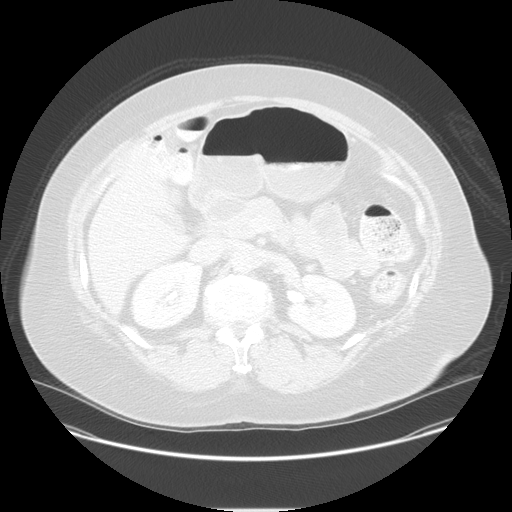
[im 26/35  soft-tissue]
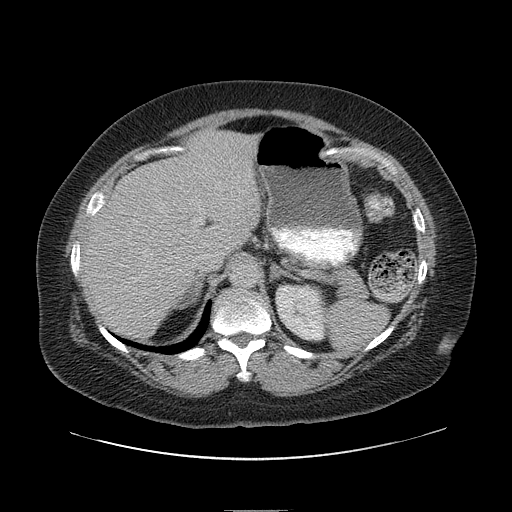
[im 26/35  lung]
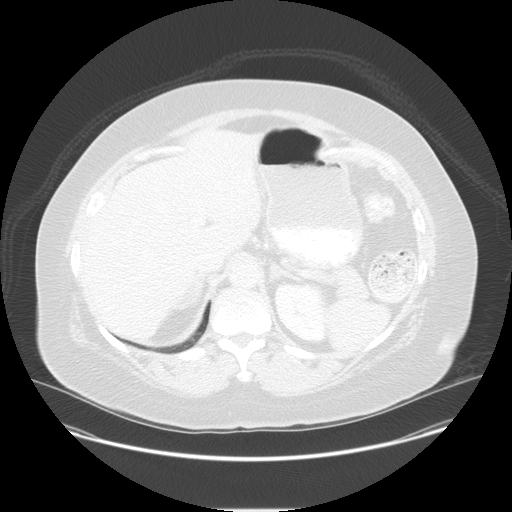

[Series 400: cor · coronal · 1.28mm/px · 3 of 150 slices shown, 4 images]
[im 50/150  soft-tissue]
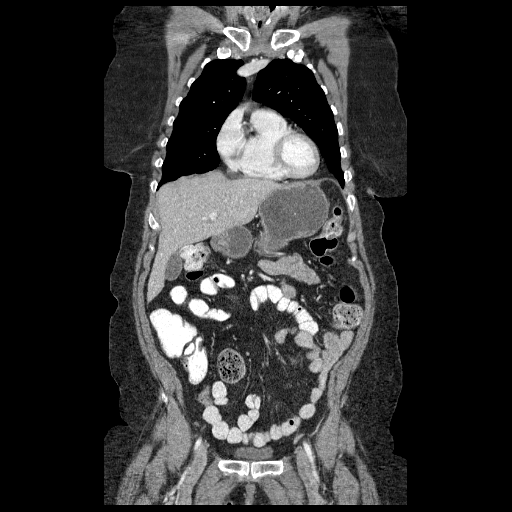
[im 67/150  soft-tissue]
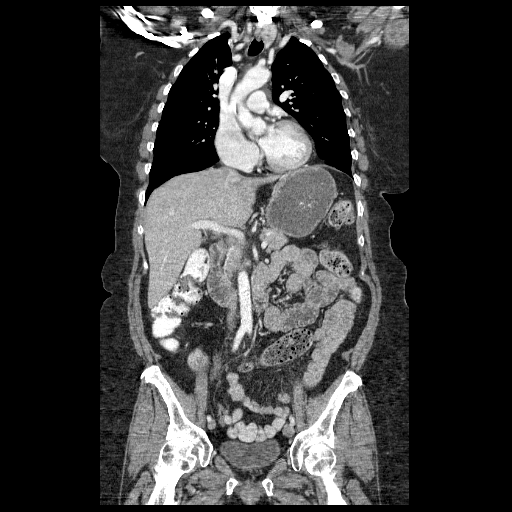
[im 67/150  bone]
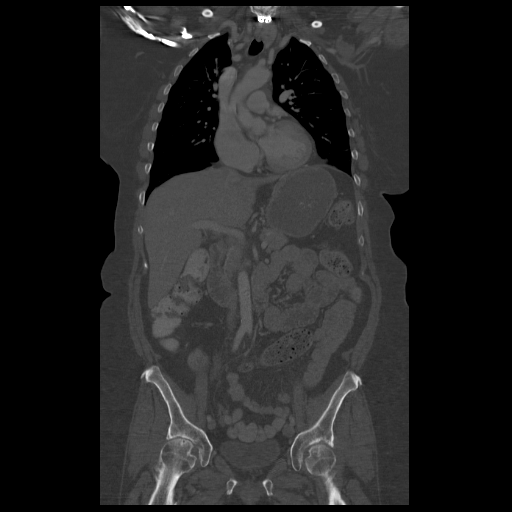
[im 83/150  soft-tissue]
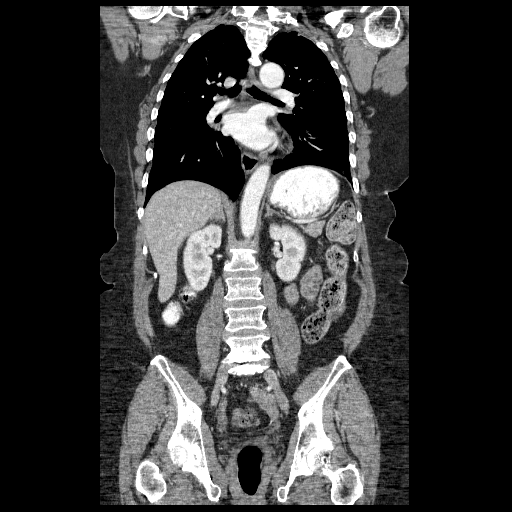

[Series 401: sag · sagittal · 1.28mm/px · 1 of 179 slices shown, 2 images]
[im 60/179  soft-tissue]
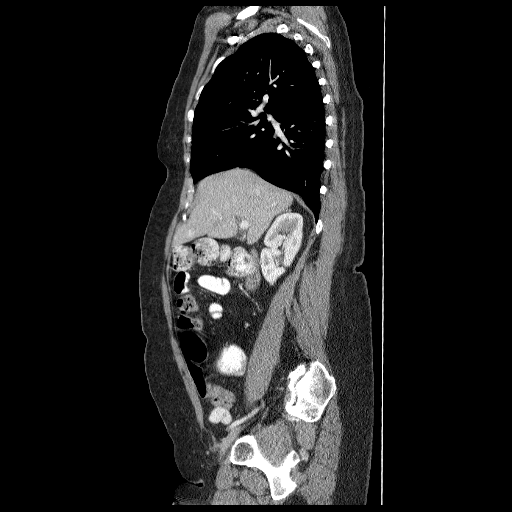
[im 60/179  bone]
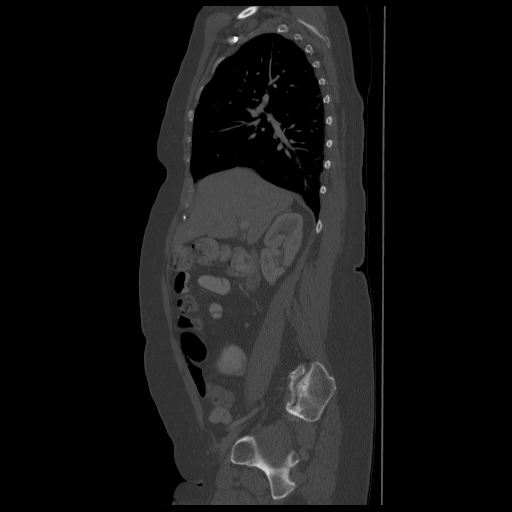

[13 of 46 positions shown; findings below may reference images not displayed]

FINDINGS: The liver, biliary tree, spleen, pancreas, adrenal
glands, and kidneys are normal.  The bowel is normal including the
terminal ileum.  Appendix is not visualized and may been removed.
Uterus has been removed.  Right ovary is normal.  Left ovary is not
discretely identified.  No free air or free fluid.  No significant
osseous abnormality.  Degenerative disc and joint disease in the
lower lumbar spine.

There is a very tiny pericardial effusion.  Heart size is normal.
IMPRESSION: No significant abnormality of the abdomen or pelvis.

## 2014-08-07 ENCOUNTER — Ambulatory Visit: Payer: Medicare Other | Admitting: Physical Therapy

## 2014-08-07 DIAGNOSIS — M545 Low back pain, unspecified: Secondary | ICD-10-CM

## 2014-08-07 DIAGNOSIS — R29898 Other symptoms and signs involving the musculoskeletal system: Secondary | ICD-10-CM | POA: Diagnosis not present

## 2014-08-07 DIAGNOSIS — M2560 Stiffness of unspecified joint, not elsewhere classified: Secondary | ICD-10-CM

## 2014-08-07 DIAGNOSIS — M5386 Other specified dorsopathies, lumbar region: Secondary | ICD-10-CM

## 2014-08-07 NOTE — Patient Instructions (Signed)
.  krissh

## 2014-08-07 NOTE — Therapy (Signed)
[place], [place], [address] Phone: [phone]   Fax:  [phone]  Physical Therapy Treatment  Patient Details  Name: [name] MRN: [id number] Date of Birth: 07/27/1945 Referring Provider:  Merrilee Seashore, MD  Encounter Date: 08/07/2014      PT End of Session - 08/07/14 1215    Visit Number 4   Number of Visits 16   Date for PT Re-Evaluation 09/22/14   PT Start Time 0051   PT Stop Time 1239   PT Time Calculation (min) 54 min   Activity Tolerance Patient tolerated treatment well   Behavior During Therapy [facility] for tasks assessed/performed      Past Medical History  Diagnosis Date  . Arthritis     Past Surgical History  Procedure Laterality Date  . Neck surgery  2010    cerv disc fused   . Colonoscopy    . Bunionectomy  2013    rt foot  . Abdominal hysterectomy  1995  . Muscle biopsy Left 10/03/2012    Procedure: LEFT QUADRICEP MUSCLE BIOPSY;  Surgeon: Odis Hollingshead, MD;  Location: [facility];  Service: General;  Laterality: Left;    There were no vitals filed for this visit.  Visit Diagnosis:  Weakness of both legs  Bilateral low back pain without sciatica  Decreased ROM of lumbar spine  Joint stiffness of spine      Subjective Assessment - 08/07/14 1150    Subjective "I have been keeping up with the exercises and noticed having a cramp this morining"   Currently in Pain? Yes   Pain Score 0-No pain  no pain just stiffness   Pain Location Back   Pain Orientation Right;Left   Pain Descriptors / Indicators Aching   Pain Type Surgical pain   Pain Onset More than a month ago   Pain Frequency Intermittent                         [facility] Adult PT Treatment/Exercise - 08/07/14 1152    Lumbar Exercises: Aerobic   Stationary Bike Nustep L6, 10 minutes   Lumbar Exercises: Standing   Heel Raises 10 reps   Functional Squats 10 reps   Lumbar  Exercises: Seated   Sit to Stand 5 reps  x 3 sets with 2 in step in chair    Lumbar Exercises: Supine   Ab Set 15 reps   Clam 15 reps   Bridge 10 reps  2 sets   Other Supine Lumbar Exercises quad set 2 x 10, rolling from to each side 2 x 10   Other Supine Lumbar Exercises hip flexion 2 x 10  with physioball    Lumbar Exercises: Sidelying   Other Sidelying Lumbar Exercises rolling/ transfers from supine<>sit x 5  performed well without cueing   Knee/Hip Exercises: Standing   Forward Step Up Left;1 set;Hand Hold: 2;Step Height: 4";10 reps  2 x, 1 set leading with RLE, and 1 x LLE   Moist Heat Therapy   Number Minutes Moist Heat 10 Minutes   Moist Heat Location Lumbar Spine  in sitting                PT Education - 08/07/14 1215    Education provided Yes   Education Details clam shells   Person(s) Educated Patient   Methods Explanation   Comprehension Verbalized understanding  PT Short Term Goals - 08/03/14 1215    PT SHORT TERM GOAL #1   Title pt will be I with basic HEP 08/23/2014   Time 4   Period Weeks   Status On-going   PT SHORT TERM GOAL #2   Title pt will increase overall trunk mobilty by 10 degrees in all planes to assist with ADLs 08/23/2014   Time 4   Period Weeks   Status On-going   PT SHORT TERM GOAL #3   Title pt will increase Bil LE strength to 3/5 to help promote safety during ambulatory activities. 08/23/2014   Time 4   Period Weeks   Status On-going   PT SHORT TERM GOAL #4   Title pt will decrease pain to <6/10 during and following standing / walking for >10 minutes to assist with endurance and safety during weight bearing activities 08/23/2014   Time 4   Period Weeks   Status On-going   PT SHORT TERM GOAL #5   Title pt will increase FOTO score by >8 points to assist with functional capacity 08/23/2014)   Time 4   Period Weeks   Status On-going   PT SHORT TERM GOAL #6   Title pt will be able to verbalize and demonstrate proper  transfer techniques from supine <>sit and bed mobility to help protect the lumbar spine and derease pain 08/23/2014   Time 4   Period Weeks   Status On-going           PT Long Term Goals - 08/03/14 1043    PT LONG TERM GOAL #1   Title pt will be I with Advanced HEP 09/22/2014   Time 8   Period Weeks   Status On-going   PT LONG TERM GOAL #2   Title pt will increase trunk mobility by > 20 degrees in all planes to assist wtih safety during driving and ADLS. 0/0/7622   Time 8   Period Weeks   Status On-going   PT LONG TERM GOAL #3   Title pt will increase bil LE strength to >4/5 strength to help with climbing >10 steps and safety using LRAD during weight bearing activities 09/22/2014   Time 8   Period Weeks   Status On-going   PT LONG TERM GOAL #4   Title pt will demonstrate <4/10 pain during standing / walking for >20 minutes to help with endurance and ADLs 09/22/2014   Time 8   Period Weeks   Status On-going   PT LONG TERM GOAL #5   Title pt will be able to verbalize and demonstrate technqiues to reduce risk of reinjury of the low back by postural awareness, lifting and carrying mechanics, and HEP 09/22/2014   Time 8   Period Weeks   Status On-going               Plan - 08/07/14 1239    Clinical Impression Statement Mariaceleste had demonstrated improvement with decreased pain and is able to perform supin<>sit transfer I without cueing. She tolerated exercises well today and reported that she could feel the muscles working during supine hip flexion. sit to stand and clam shells. added clam shells to HEP. Plan to progress with strengthening as tolerated.    PT Next Visit Plan Strengthening, bed mobility.  Check calf length, supine/ seated strengthening, step ups/ sit to stand   PT Home Exercise Plan clam shells   Consulted and Agree with Plan of Care Patient  Problem List Patient Active Problem List   Diagnosis Date Noted  . Weakness 11/03/2012    Starr Lake PT,  DPT, LAT, ATC  08/07/2014  12:42 PM    [facility] [address] [place], [place], [address] Phone: [phone]   Fax:  [phone]

## 2014-08-09 DIAGNOSIS — M6281 Muscle weakness (generalized): Secondary | ICD-10-CM | POA: Diagnosis not present

## 2014-08-09 DIAGNOSIS — Z981 Arthrodesis status: Secondary | ICD-10-CM | POA: Diagnosis not present

## 2014-08-09 DIAGNOSIS — M4326 Fusion of spine, lumbar region: Secondary | ICD-10-CM | POA: Diagnosis not present

## 2014-08-09 DIAGNOSIS — R531 Weakness: Secondary | ICD-10-CM | POA: Diagnosis not present

## 2014-08-10 ENCOUNTER — Ambulatory Visit: Payer: Medicare Other | Admitting: Physical Therapy

## 2014-08-10 DIAGNOSIS — M545 Low back pain, unspecified: Secondary | ICD-10-CM

## 2014-08-10 DIAGNOSIS — M5386 Other specified dorsopathies, lumbar region: Secondary | ICD-10-CM

## 2014-08-10 DIAGNOSIS — R29898 Other symptoms and signs involving the musculoskeletal system: Secondary | ICD-10-CM

## 2014-08-10 DIAGNOSIS — M2560 Stiffness of unspecified joint, not elsewhere classified: Secondary | ICD-10-CM

## 2014-08-10 NOTE — Therapy (Signed)
[name], [place], [address] Phone: [phone]   Fax:  [phone]  Physical Therapy Treatment  Patient Details  Name: [name] MRN: [id number] Date of Birth: [birth year] Referring Provider:  Merrilee Seashore, MD  Encounter Date: 08/10/2014      PT End of Session - 08/10/14 1106    Visit Number 5   Number of Visits 16   Date for PT Re-Evaluation 09/22/14   PT Start Time 1025   PT Stop Time 1115   PT Time Calculation (min) 50 min   Activity Tolerance Patient tolerated treatment well   Behavior During Therapy [facility] for tasks assessed/performed      Past Medical History  Diagnosis Date  . Arthritis     Past Surgical History  Procedure Laterality Date  . Neck surgery  2010    cerv disc fused   . Colonoscopy    . Bunionectomy  2013    rt foot  . Abdominal hysterectomy  1995  . Muscle biopsy Left 10/03/2012    Procedure: LEFT QUADRICEP MUSCLE BIOPSY;  Surgeon: Odis Hollingshead, MD;  Location: [place];  Service: General;  Laterality: Left;    There were no vitals filed for this visit.  Visit Diagnosis:  Weakness of both legs  Decreased ROM of lumbar spine  Bilateral low back pain without sciatica  Joint stiffness of spine      Subjective Assessment - 08/10/14 1031    Subjective pt reports seeing her physician the other day stating that he was wondering why she was still weak. "I am feeling good today"   Currently in Pain? Yes   Pain Score 0-No pain   Pain Location Back   Pain Orientation Left;Right   Pain Type Surgical pain                         [facility] Adult PT Treatment/Exercise - 08/10/14 1033    Lumbar Exercises: Stretches   Passive Hamstring Stretch 2 reps;30 seconds   Lower Trunk Rotation 5 reps;20 seconds   Lumbar Exercises: Aerobic   Stationary Bike Nustep L6, 10 minutes   Lumbar Exercises: Standing   Heel Raises 10 reps   Functional Squats 10 reps   Lumbar Exercises: Seated   Sit to Stand 10 reps   Lumbar Exercises: Supine   Ab Set 15 reps   Clam 15 reps   Bridge 10 reps  2 sets   Other Supine Lumbar Exercises quad set 2 x 10, rolling from to each side 2 x 10   Other Supine Lumbar Exercises hip flexion 2 x 10  using physioball with manual resistance   Knee/Hip Exercises: Standing   Forward Step Up Left;1 set;Hand Hold: 2;Step Height: 4";10 reps   Moist Heat Therapy   Number Minutes Moist Heat 10 Minutes   Moist Heat Location Lumbar Spine  in sitting                PT Education - 08/10/14 1106    Education provided No          PT Short Term Goals - 08/10/14 1112    PT SHORT TERM GOAL #1   Title pt will be I with basic HEP 08/23/2014   Time 4   Period Weeks   Status On-going   PT SHORT TERM GOAL #2   Title pt will increase overall trunk mobilty by 10 degrees in all planes to  assist with ADLs 08/23/2014   Time 4   Period Weeks   Status On-going   PT SHORT TERM GOAL #3   Title pt will increase Bil LE strength to 3/5 to help promote safety during ambulatory activities. 08/23/2014   Time 4   Period Weeks   Status On-going   PT SHORT TERM GOAL #4   Title pt will decrease pain to <6/10 during and following standing / walking for >10 minutes to assist with endurance and safety during weight bearing activities 08/23/2014   Time 4   Period Weeks   Status On-going   PT SHORT TERM GOAL #5   Title pt will increase FOTO score by >8 points to assist with functional capacity 08/23/2014)   Time 4   Period Weeks   Status On-going   PT SHORT TERM GOAL #6   Time 4   Period Weeks   Status On-going           PT Long Term Goals - 08/10/14 1113    PT LONG TERM GOAL #1   Title pt will be I with Advanced HEP 09/22/2014   Time 8   Period Weeks   Status On-going   PT LONG TERM GOAL #2   Title pt will increase trunk mobility by > 20 degrees in all planes to assist wtih safety during driving and  ADLS. 10/24/6657   Time 8   Period Weeks   Status On-going   PT LONG TERM GOAL #3   Title pt will increase bil LE strength to >4/5 strength to help with climbing >10 steps and safety using LRAD during weight bearing activities 09/22/2014   Time 8   Period Weeks   Status On-going   PT LONG TERM GOAL #4   Title pt will demonstrate <4/10 pain during standing / walking for >20 minutes to help with endurance and ADLs 09/22/2014   Time 8   Period Weeks   Status On-going   PT LONG TERM GOAL #5   Title pt will be able to verbalize and demonstrate technqiues to reduce risk of reinjury of the low back by postural awareness, lifting and carrying mechanics, and HEP 09/22/2014   Time 8   Period Weeks   Status On-going               Plan - 08/10/14 1106    Clinical Impression Statement [name] presents to therapy today without her back brace and utilizing a SPC for assistance instead of the RW. While walking she seems more off balance and unstable with the cane. educated pt today that it would be best to stop using 1 thing at a time since she isn't wearing the brace anymore that it would be safer to continue use of RW until she is more stable and safe. She tolerated exercsies well today with rep   PT Next Visit Plan Strengthening, bed mobility.  Check calf length, supine/ seated strengthening, step ups/ sit to stand        Problem List Patient Active Problem List   Diagnosis Date Noted  . Weakness 11/03/2012   Starr Lake PT, DPT, LAT, ATC  08/10/2014  11:17 AM   [facility] [address] [place], [place], [address] Phone: [phone]   Fax:  [phone]

## 2014-08-14 ENCOUNTER — Ambulatory Visit: Payer: Medicare Other | Admitting: Physical Therapy

## 2014-08-14 DIAGNOSIS — R29898 Other symptoms and signs involving the musculoskeletal system: Secondary | ICD-10-CM | POA: Diagnosis not present

## 2014-08-14 DIAGNOSIS — M545 Low back pain, unspecified: Secondary | ICD-10-CM

## 2014-08-14 DIAGNOSIS — M2560 Stiffness of unspecified joint, not elsewhere classified: Secondary | ICD-10-CM | POA: Diagnosis not present

## 2014-08-14 DIAGNOSIS — M5386 Other specified dorsopathies, lumbar region: Secondary | ICD-10-CM

## 2014-08-14 NOTE — Therapy (Signed)
[place], [place], [address] Phone: [phone]   Fax:  [phone]  Physical Therapy Treatment  Patient Details  Name: [name] MRN: [id number] Date of Birth: 10-27-1945 Referring Provider:  Merrilee Seashore, MD  Encounter Date: 08/14/2014      PT End of Session - 08/14/14 1231    Visit Number 6   Number of Visits 16   Date for PT Re-Evaluation 09/22/14   PT Start Time 2585   PT Stop Time 1110   PT Time Calculation (min) 55 min   Activity Tolerance Patient tolerated treatment well   Behavior During Therapy [facility] for tasks assessed/performed      Past Medical History  Diagnosis Date  . Arthritis     Past Surgical History  Procedure Laterality Date  . Neck surgery  2010    cerv disc fused   . Colonoscopy    . Bunionectomy  2013    rt foot  . Abdominal hysterectomy  1995  . Muscle biopsy Left 10/03/2012    Procedure: LEFT QUADRICEP MUSCLE BIOPSY;  Surgeon: Odis Hollingshead, MD;  Location: [place];  Service: General;  Laterality: Left;    There were no vitals filed for this visit.  Visit Diagnosis:  Weakness of both legs  Decreased ROM of lumbar spine  Bilateral low back pain without sciatica  Joint stiffness of spine      Subjective Assessment - 08/14/14 1028    Subjective "I've been doing well since the last visit, but have noticed that I am getting stronger" she reports some days she can do well and the next day require help to move the legs   Currently in Pain? Yes   Pain Score 0-No pain   Aggravating Factors  bending twisting                         [facility] Adult PT Treatment/Exercise - 08/14/14 0001    Lumbar Exercises: Stretches   Passive Hamstring Stretch 2 reps;30 seconds   Lower Trunk Rotation 5 reps;20 seconds  with rolling as one unit   Lumbar Exercises: Aerobic   Stationary Bike Nustep L6, 10 minutes   Lumbar Exercises:  Standing   Heel Raises 10 reps   Functional Squats 10 reps   Lumbar Exercises: Seated   Long Arc Quad on Chair AROM;Strengthening;Both;2 sets;10 reps  5#   Hip Flexion on Ball AROM;Strengthening;Both;15 reps  with manual resistance   Sit to Stand 10 reps   Lumbar Exercises: Supine   Ab Set 15 reps   Clam 15 reps  with abdominal draw in manuever   Bridge 10 reps  2 sets with sustained hip abduction   Straight Leg Raise 10 reps;Other (comment)  2 sets, was able to perfrom bil without assistance   Other Supine Lumbar Exercises quad set 2 x 10, rolling from to each side 2 x 10   Other Supine Lumbar Exercises hip flexion 2 x 10  RLE only with manual resistance with flex and ext   Knee/Hip Exercises: Standing   Forward Step Up Left;1 set;Hand Hold: 2;Step Height: 4";10 reps   Moist Heat Therapy   Number Minutes Moist Heat 10 Minutes   Moist Heat Location Lumbar Spine;Cervical  in sitting                PT Education - 08/14/14 1230    Education provided Yes   Education Details decresing  upper trap tightness while using AD   Person(s) Educated Patient   Methods Explanation   Comprehension Verbalized understanding          PT Short Term Goals - 08/10/14 1112    PT SHORT TERM GOAL #1   Title pt will be I with basic HEP 08/23/2014   Time 4   Period Weeks   Status On-going   PT SHORT TERM GOAL #2   Title pt will increase overall trunk mobilty by 10 degrees in all planes to assist with ADLs 08/23/2014   Time 4   Period Weeks   Status On-going   PT SHORT TERM GOAL #3   Title pt will increase Bil LE strength to 3/5 to help promote safety during ambulatory activities. 08/23/2014   Time 4   Period Weeks   Status On-going   PT SHORT TERM GOAL #4   Title pt will decrease pain to <6/10 during and following standing / walking for >10 minutes to assist with endurance and safety during weight bearing activities 08/23/2014   Time 4   Period Weeks   Status On-going   PT SHORT TERM  GOAL #5   Title pt will increase FOTO score by >8 points to assist with functional capacity 08/23/2014)   Time 4   Period Weeks   Status On-going   PT SHORT TERM GOAL #6   Time 4   Period Weeks   Status On-going           PT Long Term Goals - 08/10/14 1113    PT LONG TERM GOAL #1   Title pt will be I with Advanced HEP 09/22/2014   Time 8   Period Weeks   Status On-going   PT LONG TERM GOAL #2   Title pt will increase trunk mobility by > 20 degrees in all planes to assist wtih safety during driving and ADLS. 7/0/9628   Time 8   Period Weeks   Status On-going   PT LONG TERM GOAL #3   Title pt will increase bil LE strength to >4/5 strength to help with climbing >10 steps and safety using LRAD during weight bearing activities 09/22/2014   Time 8   Period Weeks   Status On-going   PT LONG TERM GOAL #4   Title pt will demonstrate <4/10 pain during standing / walking for >20 minutes to help with endurance and ADLs 09/22/2014   Time 8   Period Weeks   Status On-going   PT LONG TERM GOAL #5   Title pt will be able to verbalize and demonstrate technqiues to reduce risk of reinjury of the low back by postural awareness, lifting and carrying mechanics, and HEP 09/22/2014   Time 8   Period Weeks   Status On-going               Plan - 08/14/14 1232    Clinical Impression Statement [name] presents to therapy today utilizing her RW for safety per the PT's request. She was able to perform exercises well with only complaint of feeling like her R hip flexor wasn't firing. she was able to perform single leg hip flexion and leg press on physioball with manual resistance. plan to progress with strengthening as tolerated.    PT Next Visit Plan Strengthening, bed mobility.  Check calf length, supine/ seated strengthening, step ups/ sit to stand   PT Home Exercise Plan stretching the upper traps   Consulted and Agree with Plan of Care Patient  Problem List Patient Active Problem List    Diagnosis Date Noted  . Weakness 11/03/2012   Starr Lake PT, DPT, LAT, ATC  08/14/2014  12:46 PM     [facility] [address] [place], [place], [address] Phone: [phone]   Fax:  [phone]

## 2014-08-16 ENCOUNTER — Ambulatory Visit: Payer: Medicare Other | Admitting: Physical Therapy

## 2014-08-16 DIAGNOSIS — M545 Low back pain, unspecified: Secondary | ICD-10-CM

## 2014-08-16 DIAGNOSIS — M5386 Other specified dorsopathies, lumbar region: Secondary | ICD-10-CM

## 2014-08-16 DIAGNOSIS — M2560 Stiffness of unspecified joint, not elsewhere classified: Secondary | ICD-10-CM | POA: Diagnosis not present

## 2014-08-16 DIAGNOSIS — R29898 Other symptoms and signs involving the musculoskeletal system: Secondary | ICD-10-CM

## 2014-08-16 NOTE — Therapy (Signed)
[place], [place], [address] Phone: [phone]   Fax:  [phone]  Physical Therapy Treatment  Patient Details  Name: [name] MRN: [id number] Date of Birth: Jan 03, 1946 Referring Provider:  Merrilee Seashore, MD  Encounter Date: 08/16/2014      PT End of Session - 08/16/14 1119    Visit Number 7   Number of Visits 16   Date for PT Re-Evaluation 09/22/14   PT Start Time 1020   PT Stop Time 1105   PT Time Calculation (min) 45 min   Activity Tolerance Patient tolerated treatment well   Behavior During Therapy [facility] for tasks assessed/performed      Past Medical History  Diagnosis Date  . Arthritis     Past Surgical History  Procedure Laterality Date  . Neck surgery  2010    cerv disc fused   . Colonoscopy    . Bunionectomy  2013    rt foot  . Abdominal hysterectomy  1995  . Muscle biopsy Left 10/03/2012    Procedure: LEFT QUADRICEP MUSCLE BIOPSY;  Surgeon: Odis Hollingshead, MD;  Location: [place];  Service: General;  Laterality: Left;    There were no vitals filed for this visit.  Visit Diagnosis:  Weakness of both legs  Decreased ROM of lumbar spine  Bilateral low back pain without sciatica  Joint stiffness of spine      Subjective Assessment - 08/16/14 1020    Subjective My leg are feeling better today.  I was agfraid to say anything to my husband, I didn't want to get his hopes up.    Currently in Pain? No/denies                         [facility] Adult PT Treatment/Exercise - 08/16/14 1023    Ambulation/Gait   Gait Comments practiced walking with simulated toe push off. Tone limits   Lumbar Exercises: Aerobic   Stationary Bike Nustep, level 7 10 minutes   Lumbar Exercises: Machines for Strengthening   Cybex Knee Flexion --  1-2 plates 1 - 2 legs multiple reps   Leg Press --  1-3 plates 10 to 30 reps single and double   Ankle Exercises:  Stretches   Gastroc Stretch 3 reps;30 seconds  each with strap , tone noted   Other Stretch toe stretch with kneeflexed , added to home program                  PT Short Term Goals - 08/10/14 1112    PT SHORT TERM GOAL #1   Title pt will be I with basic HEP 08/23/2014   Time 4   Period Weeks   Status On-going   PT SHORT TERM GOAL #2   Title pt will increase overall trunk mobilty by 10 degrees in all planes to assist with ADLs 08/23/2014   Time 4   Period Weeks   Status On-going   PT SHORT TERM GOAL #3   Title pt will increase Bil LE strength to 3/5 to help promote safety during ambulatory activities. 08/23/2014   Time 4   Period Weeks   Status On-going   PT SHORT TERM GOAL #4   Title pt will decrease pain to <6/10 during and following standing / walking for >10 minutes to assist with endurance and safety during weight bearing activities 08/23/2014   Time 4   Period Weeks   Status  On-going   PT SHORT TERM GOAL #5   Title pt will increase FOTO score by >8 points to assist with functional capacity 08/23/2014)   Time 4   Period Weeks   Status On-going   PT SHORT TERM GOAL #6   Time 4   Period Weeks   Status On-going           PT Long Term Goals - 08/10/14 1113    PT LONG TERM GOAL #1   Title pt will be I with Advanced HEP 09/22/2014   Time 8   Period Weeks   Status On-going   PT LONG TERM GOAL #2   Title pt will increase trunk mobility by > 20 degrees in all planes to assist wtih safety during driving and ADLS. 04/30/2058   Time 8   Period Weeks   Status On-going   PT LONG TERM GOAL #3   Title pt will increase bil LE strength to >4/5 strength to help with climbing >10 steps and safety using LRAD during weight bearing activities 09/22/2014   Time 8   Period Weeks   Status On-going   PT LONG TERM GOAL #4   Title pt will demonstrate <4/10 pain during standing / walking for >20 minutes to help with endurance and ADLs 09/22/2014   Time 8   Period Weeks   Status On-going    PT LONG TERM GOAL #5   Title pt will be able to verbalize and demonstrate technqiues to reduce risk of reinjury of the low back by postural awareness, lifting and carrying mechanics, and HEP 09/22/2014   Time 8   Period Weeks   Status On-going               Plan - 08/16/14 1120    Clinical Impression Statement Strengthening and stretching focus today.  Patient moving is much improved since I have seen her 2 weeks ago.  Tone present in Rt foot.  Tried to inhibit with manual pressure , unable to inhibit.     PT Next Visit Plan Strengthening, bed mobility.  Check calf length, supine/ seated strengthening, step ups/ sit to stand, tone inhibition.   Consulted and Agree with Plan of Care Patient        Problem List Patient Active Problem List   Diagnosis Date Noted  . Weakness 11/03/2012    Lou Loewe 08/16/2014, 11:28 AM  [facility] [address] [place], [place], [address] Phone: [phone]   Fax:  [phone]    Melvenia Needles, PTA 08/16/2014 11:28 AM Phone: [phone] Fax: [phone]

## 2014-08-21 ENCOUNTER — Ambulatory Visit: Payer: Medicare Other | Admitting: Physical Therapy

## 2014-08-21 DIAGNOSIS — M545 Low back pain, unspecified: Secondary | ICD-10-CM

## 2014-08-21 DIAGNOSIS — M5386 Other specified dorsopathies, lumbar region: Secondary | ICD-10-CM

## 2014-08-21 DIAGNOSIS — M2560 Stiffness of unspecified joint, not elsewhere classified: Secondary | ICD-10-CM

## 2014-08-21 DIAGNOSIS — R29898 Other symptoms and signs involving the musculoskeletal system: Secondary | ICD-10-CM

## 2014-08-21 NOTE — Therapy (Signed)
[place], [place], [address] Phone: [phone]   Fax:  [phone]  Physical Therapy Treatment  Patient Details  Name: [name] MRN: [id number] Date of Birth: Feb 01, 1946 Referring Provider:  Merrilee Seashore, MD  Encounter Date: 08/21/2014      PT End of Session - 08/21/14 1257    Visit Number 8   Number of Visits 16   Date for PT Re-Evaluation 09/22/14   PT Start Time 1540   PT Stop Time 1230   PT Time Calculation (min) 45 min      Past Medical History  Diagnosis Date  . Arthritis     Past Surgical History  Procedure Laterality Date  . Neck surgery  2010    cerv disc fused   . Colonoscopy    . Bunionectomy  2013    rt foot  . Abdominal hysterectomy  1995  . Muscle biopsy Left 10/03/2012    Procedure: LEFT QUADRICEP MUSCLE BIOPSY;  Surgeon: Odis Hollingshead, MD;  Location: [place];  Service: General;  Laterality: Left;    There were no vitals filed for this visit.  Visit Diagnosis:  Weakness of both legs  Decreased ROM of lumbar spine  Bilateral low back pain without sciatica  Joint stiffness of spine      Subjective Assessment - 08/21/14 1144    Subjective "I feel like I have some tingling in the R LE that started yesterday, but have been feeling good."   Currently in Pain? No/denies            [facility] PT Assessment - 08/21/14 0001    Tone   Assessment Location Right Lower Extremity   AROM   AROM Assessment Site Ankle   Right/Left Ankle Left;Right   Right Ankle Dorsiflexion 10  limited by increased tone   Left Ankle Dorsiflexion 18   Flexibility   Soft Tissue Assessment /Muscle Length yes   RLE Tone   RLE Tone Hypertonic   RLE Tone   Hypertonic Details 7 beat clonus upon plantarflexion quick stretch                     [facility] Adult PT Treatment/Exercise - 08/21/14 0001    Ambulation/Gait   Gait Comments practiced walking  with simulated toe push off, without AFO 2 x   with SPC using gait belt 3 x 15 ft   Lumbar Exercises: Stretches   Lower Trunk Rotation 5 reps;20 seconds   Lumbar Exercises: Seated   Sit to Stand 10 reps  x 3 sets, with table lowered between sets/ball between knees   Lumbar Exercises: Supine   Clam 15 reps   Bridge 10 reps   Straight Leg Raise 10 reps;Other (comment)   Knee/Hip Exercises: Standing   Forward Step Up Left;1 set;Hand Hold: 2;Step Height: 4";10 reps  without AFO and with gait belt for safety   Ankle Exercises: Stretches   Gastroc Stretch 2 reps;3 reps;60 seconds   Other Stretch toe stretch with kneeflexed , added to home program                PT Education - 08/21/14 1256    Education provided Yes   Education Details begin strengthening ankles for HEP, walking around th ehouse without AFO with SPC for support.    Person(s) Educated Patient   Methods Explanation   Comprehension Verbalized understanding          PT Short  Term Goals - 08/21/14 1300    PT SHORT TERM GOAL #1   Title pt will be I with basic HEP 08/23/2014   Time 4   Period Weeks   Status On-going   PT SHORT TERM GOAL #2   Title pt will increase overall trunk mobilty by 10 degrees in all planes to assist with ADLs 08/23/2014   Time 4   Period Weeks   Status On-going   PT SHORT TERM GOAL #3   Title pt will increase Bil LE strength to 3/5 to help promote safety during ambulatory activities. 08/23/2014   Time 4   Period Weeks   Status On-going   PT SHORT TERM GOAL #4   Title pt will decrease pain to <6/10 during and following standing / walking for >10 minutes to assist with endurance and safety during weight bearing activities 08/23/2014   Time 4   Period Weeks   Status On-going   PT SHORT TERM GOAL #5   Title pt will increase FOTO score by >8 points to assist with functional capacity 08/23/2014)   Time 4   Period Weeks   Status On-going   PT SHORT TERM GOAL #6   Title pt will be able to  verbalize and demonstrate proper transfer techniques from supine <>sit and bed mobility to help protect the lumbar spine and derease pain 08/23/2014   Time 4   Period Weeks   Status On-going           PT Long Term Goals - 08/21/14 1301    PT LONG TERM GOAL #1   Title pt will be I with Advanced HEP 09/22/2014   Time 8   Period Weeks   Status On-going   PT LONG TERM GOAL #2   Title pt will increase trunk mobility by > 20 degrees in all planes to assist wtih safety during driving and ADLS. 10/25/4625   Time 8   Period Weeks   Status On-going   PT LONG TERM GOAL #3   Title pt will increase bil LE strength to >4/5 strength to help with climbing >10 steps and safety using LRAD during weight bearing activities 09/22/2014   Time 8   Period Weeks   Status On-going   PT LONG TERM GOAL #4   Title pt will demonstrate <4/10 pain during standing / walking for >20 minutes to help with endurance and ADLs 09/22/2014   Time 8   Period Weeks   Status On-going   PT LONG TERM GOAL #5   Title pt will be able to verbalize and demonstrate technqiues to reduce risk of reinjury of the low back by postural awareness, lifting and carrying mechanics, and HEP 09/22/2014   Time 8   Period Weeks   Status On-going               Plan - 08/21/14 1257    Clinical Impression Statement [name] continues to make progress with increased strength and stability during walking with RW. Assessed her tone today and she demonstrates increased PF tone with a 7 beat clonus upon quick stretch. Attempted ambulation without her AFO and she reported no difficulty, with SPC and gait belt for safety. She tolerated tx well, with report of only feeling fatigued following. attempted stretching of the quads and strengthening of the dorsiflexors to help facilitate inhibition of the ankle PF. Plan to progress with strengthening as tolerated. Added sciatic nerve glides/stretch in sitting to assist with decreased burning/tightness.  Problem List Patient Active Problem List   Diagnosis Date Noted  . Weakness 11/03/2012   Starr Lake PT, DPT, LAT, ATC  08/21/2014  1:03 PM    [place] [facility] [address] [place], [place], [address] Phone: [phone]   Fax:  [phone]

## 2014-08-23 ENCOUNTER — Ambulatory Visit: Payer: Medicare Other | Attending: Physician Assistant | Admitting: Physical Therapy

## 2014-08-23 DIAGNOSIS — R29898 Other symptoms and signs involving the musculoskeletal system: Secondary | ICD-10-CM | POA: Insufficient documentation

## 2014-08-23 DIAGNOSIS — M545 Low back pain, unspecified: Secondary | ICD-10-CM

## 2014-08-23 DIAGNOSIS — M2560 Stiffness of unspecified joint, not elsewhere classified: Secondary | ICD-10-CM | POA: Insufficient documentation

## 2014-08-23 NOTE — Therapy (Signed)
[place], [place], [address] Phone: [phone]   Fax:  [phone]  Physical Therapy Treatment  Patient Details  Name: [name] MRN: [id number] Date of Birth: Sep 03, 1945 Referring Provider:  Merrilee Seashore, MD  Encounter Date: 08/23/2014      PT End of Session - 08/23/14 1314    Visit Number 9   Number of Visits 16   Date for PT Re-Evaluation 09/22/14   PT Start Time 1150   PT Stop Time 4818   PT Time Calculation (min) 45 min   Activity Tolerance Patient tolerated treatment well      Past Medical History  Diagnosis Date  . Arthritis     Past Surgical History  Procedure Laterality Date  . Neck surgery  2010    cerv disc fused   . Colonoscopy    . Bunionectomy  2013    rt foot  . Abdominal hysterectomy  1995  . Muscle biopsy Left 10/03/2012    Procedure: LEFT QUADRICEP MUSCLE BIOPSY;  Surgeon: Odis Hollingshead, MD;  Location: [facility];  Service: General;  Laterality: Left;    There were no vitals filed for this visit.  Visit Diagnosis:  Weakness of both legs  Bilateral low back pain without sciatica      Subjective Assessment - 08/23/14 1154    Subjective Nothing new.   Currently in Pain? No/denies   Pain Score 0-No pain                         [facility] Adult PT Treatment/Exercise - 08/23/14 1155    Ambulation/Gait   Gait Comments Modified Lt shoe Lateral wedge. for increased stabilization.     Lumbar Exercises: Stretches   Active Hamstring Stretch Limitations Dural stretch with stretch with strap 10 reps 1-2 second holds.    Lumbar Exercises: Machines for Strengthening   Other Lumbar Machine Exercise Nustep 8 minutes. L6   Lumbar Exercises: Seated   Other Seated Lumbar Exercises PNF Rt leg , multiple reps to decrease tone.AA   Ankle Exercises: Seated   Towel Inversion/Eversion Limitations stiff initially, multiple reps with instructions    Ankle Exercises: Stretches   Gastroc Stretch 3 reps;30 seconds   Other Stretch toe stretch as previous 10 reps 10 second holds.                PT Education - 08/23/14 1313    Education provided Yes   Education Details towel ankle focus eversion   Person(s) Educated Patient   Methods Explanation;Demonstration;Tactile cues;Verbal cues;Handout   Comprehension Verbalized understanding;Returned demonstration          PT Short Term Goals - 08/21/14 1300    PT SHORT TERM GOAL #1   Title pt will be I with basic HEP 08/23/2014   Time 4   Period Weeks   Status On-going   PT SHORT TERM GOAL #2   Title pt will increase overall trunk mobilty by 10 degrees in all planes to assist with ADLs 08/23/2014   Time 4   Period Weeks   Status On-going   PT SHORT TERM GOAL #3   Title pt will increase Bil LE strength to 3/5 to help promote safety during ambulatory activities. 08/23/2014   Time 4   Period Weeks   Status On-going   PT SHORT TERM GOAL #4   Title pt will decrease pain to <6/10 during and following standing / walking  for >10 minutes to assist with endurance and safety during weight bearing activities 08/23/2014   Time 4   Period Weeks   Status On-going   PT SHORT TERM GOAL #5   Title pt will increase FOTO score by >8 points to assist with functional capacity 08/23/2014)   Time 4   Period Weeks   Status On-going   PT SHORT TERM GOAL #6   Title pt will be able to verbalize and demonstrate proper transfer techniques from supine <>sit and bed mobility to help protect the lumbar spine and derease pain 08/23/2014   Time 4   Period Weeks   Status On-going           PT Long Term Goals - 08/21/14 1301    PT LONG TERM GOAL #1   Title pt will be I with Advanced HEP 09/22/2014   Time 8   Period Weeks   Status On-going   PT LONG TERM GOAL #2   Title pt will increase trunk mobility by > 20 degrees in all planes to assist wtih safety during driving and ADLS. 11/26/2839   Time 8   Period  Weeks   Status On-going   PT LONG TERM GOAL #3   Title pt will increase bil LE strength to >4/5 strength to help with climbing >10 steps and safety using LRAD during weight bearing activities 09/22/2014   Time 8   Period Weeks   Status On-going   PT LONG TERM GOAL #4   Title pt will demonstrate <4/10 pain during standing / walking for >20 minutes to help with endurance and ADLs 09/22/2014   Time 8   Period Weeks   Status On-going   PT LONG TERM GOAL #5   Title pt will be able to verbalize and demonstrate technqiues to reduce risk of reinjury of the low back by postural awareness, lifting and carrying mechanics, and HEP 09/22/2014   Time 8   Period Weeks   Status On-going               Plan - 08/23/14 1315    Clinical Impression Statement Trial heel wedge lateral foot and rotations to decrease tone. , Burning improving. Patient thought walking was a little easier post session.   PT Next Visit Plan assess wedge, teach husband PNF for tone decrease.step ups and more   Consulted and Agree with Plan of Care Patient        Problem List Patient Active Problem List   Diagnosis Date Noted  . Weakness 11/03/2012    [name],[name] 08/23/2014, 1:18 PM  [facility] [address] [place], [place], [address] Phone: [phone]   Fax:  [phone]   Melvenia Needles, PTA 08/23/2014 1:18 PM Phone: [phone] Fax: [phone]

## 2014-08-28 ENCOUNTER — Ambulatory Visit: Payer: Medicare Other | Admitting: Physical Therapy

## 2014-08-28 DIAGNOSIS — M5386 Other specified dorsopathies, lumbar region: Secondary | ICD-10-CM

## 2014-08-28 DIAGNOSIS — M545 Low back pain, unspecified: Secondary | ICD-10-CM

## 2014-08-28 DIAGNOSIS — R29898 Other symptoms and signs involving the musculoskeletal system: Secondary | ICD-10-CM

## 2014-08-28 DIAGNOSIS — M2560 Stiffness of unspecified joint, not elsewhere classified: Secondary | ICD-10-CM | POA: Diagnosis not present

## 2014-08-28 NOTE — Therapy (Signed)
[place], [place], [address] Phone: [phone]   Fax:  [phone]  Physical Therapy Treatment  Patient Details  Name: [name] MRN: [id number] Date of Birth: 11-Jan-1946 Referring Provider:  Merrilee Seashore, MD  Encounter Date: 08/28/2014      PT End of Session - 08/28/14 1714    Visit Number 10   Number of Visits 16   Date for PT Re-Evaluation 09/22/14   PT Start Time 7342   PT Stop Time 1640   PT Time Calculation (min) 35 min   Activity Tolerance Patient tolerated treatment well   Behavior During Therapy [facility] for tasks assessed/performed      Past Medical History  Diagnosis Date  . Arthritis     Past Surgical History  Procedure Laterality Date  . Neck surgery  2010    cerv disc fused   . Colonoscopy    . Bunionectomy  2013    rt foot  . Abdominal hysterectomy  1995  . Muscle biopsy Left 10/03/2012    Procedure: LEFT QUADRICEP MUSCLE BIOPSY;  Surgeon: Odis Hollingshead, MD;  Location: [facility];  Service: General;  Laterality: Left;    There were no vitals filed for this visit.  Visit Diagnosis:  Weakness of both legs  Bilateral low back pain without sciatica  Decreased ROM of lumbar spine  Joint stiffness of spine      Subjective Assessment - 08/28/14 1628    Subjective "my R hip is feeling weak, but only when I wake up when I am laying on it"   Currently in Pain? No/denies   Pain Score 0-No pain   Aggravating Factors  bending and twisting   Pain Relieving Factors resting                         [facility] Adult PT Treatment/Exercise - 08/28/14 0001    Lumbar Exercises: Stretches   Active Hamstring Stretch Limitations Dural stretch with stretch with strap 10 reps 1-2 second holds.    Lumbar Exercises: Standing   Other Standing Lumbar Exercises walking/marching x 100 ft  with [employer] for support.    Lumbar Exercises: Seated   Sit to Stand 10  reps  VC to keep knees straight, and not to push off from table   Other Seated Lumbar Exercises PNF Rt leg , multiple reps to decrease tone.AA   Lumbar Exercises: Supine   Clam 15 reps   Bridge 10 reps   Straight Leg Raise 10 reps;Other (comment)   Ankle Exercises: Stretches   Gastroc Stretch 3 reps;30 seconds  10 beat clonus initially upon quick stretch   Other Stretch toe stretch as previous 10 reps 10 second holds.   Ankle Exercises: Seated   Other Seated Ankle Exercises 4 way ankle x 15 eac  with red theraband.                PT Education - 08/28/14 1641    Education provided Yes   Education Details continue with ankle strengthening, with increased focus on eversion.    Person(s) Educated Patient   Methods Explanation   Comprehension Verbalized understanding          PT Short Term Goals - 08/21/14 1300    PT SHORT TERM GOAL #1   Title pt will be I with basic HEP 08/23/2014   Time 4   Period Weeks   Status On-going   PT  SHORT TERM GOAL #2   Title pt will increase overall trunk mobilty by 10 degrees in all planes to assist with ADLs 08/23/2014   Time 4   Period Weeks   Status On-going   PT SHORT TERM GOAL #3   Title pt will increase Bil LE strength to 3/5 to help promote safety during ambulatory activities. 08/23/2014   Time 4   Period Weeks   Status On-going   PT SHORT TERM GOAL #4   Title pt will decrease pain to <6/10 during and following standing / walking for >10 minutes to assist with endurance and safety during weight bearing activities 08/23/2014   Time 4   Period Weeks   Status On-going   PT SHORT TERM GOAL #5   Title pt will increase FOTO score by >8 points to assist with functional capacity 08/23/2014)   Time 4   Period Weeks   Status On-going   PT SHORT TERM GOAL #6   Title pt will be able to verbalize and demonstrate proper transfer techniques from supine <>sit and bed mobility to help protect the lumbar spine and derease pain 08/23/2014   Time 4    Period Weeks   Status On-going           PT Long Term Goals - 08/21/14 1301    PT LONG TERM GOAL #1   Title pt will be I with Advanced HEP 09/22/2014   Time 8   Period Weeks   Status On-going   PT LONG TERM GOAL #2   Title pt will increase trunk mobility by > 20 degrees in all planes to assist wtih safety during driving and ADLS. 05/26/5730   Time 8   Period Weeks   Status On-going   PT LONG TERM GOAL #3   Title pt will increase bil LE strength to >4/5 strength to help with climbing >10 steps and safety using LRAD during weight bearing activities 09/22/2014   Time 8   Period Weeks   Status On-going   PT LONG TERM GOAL #4   Title pt will demonstrate <4/10 pain during standing / walking for >20 minutes to help with endurance and ADLs 09/22/2014   Time 8   Period Weeks   Status On-going   PT LONG TERM GOAL #5   Title pt will be able to verbalize and demonstrate technqiues to reduce risk of reinjury of the low back by postural awareness, lifting and carrying mechanics, and HEP 09/22/2014   Time 8   Period Weeks   Status On-going               Plan - 2014-09-05 1715    Clinical Impression Statement Felise presents to therapy 20 minutes late. Focused todays treatment on ankle strengthening and stretching to prevent tone. She tolerated exercises well plan to progress weight bearing activities and walking as tolerated.    PT Next Visit Plan teach husband PNF for tone decrease.step ups, FOTO,    Consulted and Agree with Plan of Care Patient          G-Codes - 09-05-14 1717    Functional Assessment Tool Used clinical Judgement 60% limited   Functional Limitation Mobility: Walking and moving around   Mobility: Walking and Moving Around Current Status (K0254) At least 60 percent but less than 80 percent impaired, limited or restricted   Mobility: Walking and Moving Around Goal Status (Y7062) At least 40 percent but less than 60 percent impaired, limited or restricted  Problem  List Patient Active Problem List   Diagnosis Date Noted  . Weakness 11/03/2012   Starr [place] PT, DPT, LAT, ATC  08/28/2014  5:18 PM     [facility] [address] [place], [place], [address] Phone: [phone]   Fax:  [phone]

## 2014-08-30 DIAGNOSIS — M4806 Spinal stenosis, lumbar region: Secondary | ICD-10-CM | POA: Diagnosis not present

## 2014-08-30 DIAGNOSIS — M5126 Other intervertebral disc displacement, lumbar region: Secondary | ICD-10-CM | POA: Diagnosis not present

## 2014-08-30 DIAGNOSIS — M5124 Other intervertebral disc displacement, thoracic region: Secondary | ICD-10-CM | POA: Diagnosis not present

## 2014-08-30 DIAGNOSIS — M6281 Muscle weakness (generalized): Secondary | ICD-10-CM | POA: Diagnosis not present

## 2014-08-30 DIAGNOSIS — M4804 Spinal stenosis, thoracic region: Secondary | ICD-10-CM | POA: Diagnosis not present

## 2014-08-30 DIAGNOSIS — M4326 Fusion of spine, lumbar region: Secondary | ICD-10-CM | POA: Diagnosis not present

## 2014-08-30 DIAGNOSIS — M47816 Spondylosis without myelopathy or radiculopathy, lumbar region: Secondary | ICD-10-CM | POA: Diagnosis not present

## 2014-08-30 DIAGNOSIS — Z981 Arthrodesis status: Secondary | ICD-10-CM | POA: Diagnosis not present

## 2014-09-04 ENCOUNTER — Ambulatory Visit: Payer: Medicare Other | Admitting: Physical Therapy

## 2014-09-04 DIAGNOSIS — R29898 Other symptoms and signs involving the musculoskeletal system: Secondary | ICD-10-CM

## 2014-09-04 DIAGNOSIS — M2560 Stiffness of unspecified joint, not elsewhere classified: Secondary | ICD-10-CM

## 2014-09-04 DIAGNOSIS — M545 Low back pain: Secondary | ICD-10-CM | POA: Diagnosis not present

## 2014-09-04 NOTE — Therapy (Signed)
[place], [place], [address] Phone: [phone]   Fax:  [phone]  Physical Therapy Treatment  Patient Details  Name: [name] MRN: [id number] Date of Birth: November 19, 1945 Referring Provider:  Erline Hau, PA-C  Encounter Date: 09/04/2014      PT End of Session - 09/04/14 1718    Visit Number 11   Number of Visits 16   Date for PT Re-Evaluation 09/22/14   PT Start Time 4270   PT Stop Time 1500   PT Time Calculation (min) 43 min   Activity Tolerance Patient tolerated treatment well;Patient limited by fatigue   Behavior During Therapy [facility] for tasks assessed/performed      Past Medical History  Diagnosis Date  . Arthritis     Past Surgical History  Procedure Laterality Date  . Neck surgery  2010    cerv disc fused   . Colonoscopy    . Bunionectomy  2013    rt foot  . Abdominal hysterectomy  1995  . Muscle biopsy Left 10/03/2012    Procedure: LEFT QUADRICEP MUSCLE BIOPSY;  Surgeon: Odis Hollingshead, MD;  Location: [facility];  Service: General;  Laterality: Left;    There were no vitals filed for this visit.  Visit Diagnosis:  Weakness of both legs  Joint stiffness of spine      Subjective Assessment - 09/04/14 1713    Subjective --  Did not realise she was not wearing her brace today     pain up to 8/10 in back.   Worse bending to get clothes from dryer Better with change of position and rest.  Pain Meds? Varies in intensity.  Feels her back is not tight.                  [facility] Adult PT Treatment/Exercise - 09/04/14 1400    Lumbar Exercises: Stretches   Pelvic Tilt --  10 reps 5 second holds   Lumbar Exercises: Seated   Long Arc Quad on Chair 10 reps   LAQ on Chair Limitations mmt 4/5 Quads and hamstrings   Sit to Stand 10 reps  sitting on high table.  2+/5 MMT RT   Other Seated Lumbar Exercises PNF Rt leg , multiple reps to decrease  tone.AA  husband practiced, educated able to do correctly   Lumbar Exercises: Supine   Heel Slides 10 reps  AA feet on ball    Bridge 10 reps  loegs on ball   Straight Leg Raise --  5 reps , 2 sets    Lumbar Exercises: Sidelying   Clam 10 reps  cues AA RT   Other Sidelying Lumbar Exercises Hip IR AA RT 10 each                PT Education - 09/04/14 1714    Education provided Yes   Education Details PNF   Person(s) Educated Patient;Spouse   Methods Explanation;Demonstration;Tactile cues;Verbal cues   Comprehension Verbalized understanding;Returned demonstration          PT Short Term Goals - 08/21/14 1300    PT SHORT TERM GOAL #1   Title pt will be I with basic HEP 08/23/2014   Time 4   Period Weeks   Status On-going   PT SHORT TERM GOAL #2   Title pt will increase overall trunk mobilty by 10 degrees in all planes to assist with ADLs 08/23/2014   Time 4   Period  Weeks   Status On-going   PT SHORT TERM GOAL #3   Title pt will increase Bil LE strength to 3/5 to help promote safety during ambulatory activities. 08/23/2014   Time 4   Period Weeks   Status On-going   PT SHORT TERM GOAL #4   Title pt will decrease pain to <6/10 during and following standing / walking for >10 minutes to assist with endurance and safety during weight bearing activities 08/23/2014   Time 4   Period Weeks   Status On-going   PT SHORT TERM GOAL #5   Title pt will increase FOTO score by >8 points to assist with functional capacity 08/23/2014)   Time 4   Period Weeks   Status On-going   PT SHORT TERM GOAL #6   Title pt will be able to verbalize and demonstrate proper transfer techniques from supine <>sit and bed mobility to help protect the lumbar spine and derease pain 08/23/2014   Time 4   Period Weeks   Status On-going           PT Long Term Goals - 08/21/14 1301    PT LONG TERM GOAL #1   Title pt will be I with Advanced HEP 09/22/2014   Time 8   Period Weeks   Status On-going    PT LONG TERM GOAL #2   Title pt will increase trunk mobility by > 20 degrees in all planes to assist wtih safety during driving and ADLS. 08/28/1273   Time 8   Period Weeks   Status On-going   PT LONG TERM GOAL #3   Title pt will increase bil LE strength to >4/5 strength to help with climbing >10 steps and safety using LRAD during weight bearing activities 09/22/2014   Time 8   Period Weeks   Status On-going   PT LONG TERM GOAL #4   Title pt will demonstrate <4/10 pain during standing / walking for >20 minutes to help with endurance and ADLs 09/22/2014   Time 8   Period Weeks   Status On-going   PT LONG TERM GOAL #5   Title pt will be able to verbalize and demonstrate technqiues to reduce risk of reinjury of the low back by postural awareness, lifting and carrying mechanics, and HEP 09/22/2014   Time 8   Period Weeks   Status On-going               Plan - 09/04/14 1719    Clinical Impression Statement Fatigued today, not realising she was not wearing brace.  Ed to husband to help pain.  More functional at home in kitchen and Pain with dryer.  She now realises how to use equipment to help with reach.   PT Next Visit Plan FOTO, ask husband if he has any questions ablou ERO        Problem List Patient Active Problem List   Diagnosis Date Noted  . Weakness 11/03/2012    [name],[name] 09/04/2014, 5:25 PM  [facility] [address] [place], [place], [address] Phone: [phone]   Fax:  [phone]   Melvenia Needles, PTA 09/04/2014 5:25 PM Phone: [phone] Fax: [phone]

## 2014-09-06 ENCOUNTER — Ambulatory Visit: Payer: Medicare Other | Admitting: Physical Therapy

## 2014-09-06 DIAGNOSIS — R29898 Other symptoms and signs involving the musculoskeletal system: Secondary | ICD-10-CM | POA: Diagnosis not present

## 2014-09-06 DIAGNOSIS — M545 Low back pain: Secondary | ICD-10-CM | POA: Diagnosis not present

## 2014-09-06 DIAGNOSIS — M2560 Stiffness of unspecified joint, not elsewhere classified: Secondary | ICD-10-CM | POA: Diagnosis not present

## 2014-09-06 DIAGNOSIS — M5386 Other specified dorsopathies, lumbar region: Secondary | ICD-10-CM

## 2014-09-06 NOTE — Therapy (Signed)
[place], [place], [address] Phone: [phone]   Fax:  [phone]  Physical Therapy Treatment  Patient Details  Name: JOOD RETANA MRN: [id number] Date of Birth: 23-Oct-1945 Referring Provider:  Merrilee Seashore, MD  Encounter Date: 09/06/2014      PT End of Session - 09/06/14 1735    Visit Number 12   Number of Visits 16   Date for PT Re-Evaluation 09/22/14   PT Start Time 4431   PT Stop Time 1505   PT Time Calculation (min) 44 min   Activity Tolerance Patient tolerated treatment well   Behavior During Therapy [facility] for tasks assessed/performed      Past Medical History  Diagnosis Date  . Arthritis     Past Surgical History  Procedure Laterality Date  . Neck surgery  2010    cerv disc fused   . Colonoscopy    . Bunionectomy  2013    rt foot  . Abdominal hysterectomy  1995  . Muscle biopsy Left 10/03/2012    Procedure: LEFT QUADRICEP MUSCLE BIOPSY;  Surgeon: Odis Hollingshead, MD;  Location: [place];  Service: General;  Laterality: Left;    There were no vitals filed for this visit.  Visit Diagnosis:  Weakness of both legs  Joint stiffness of spine  Decreased ROM of lumbar spine      Subjective Assessment - 09/06/14 1425    Subjective Fatigued anterior hips last session, she felt fine after a nap no pain today.  Not wearing brace either.   Currently in Pain? No/denies                         [facility] Adult PT Treatment/Exercise - 09/06/14 1445    Ambulation/Gait   Gait Comments able to advance Rt leg easier.post exercise   Exercises   Exercises --  Ankle AROM, Hip arom, AA rom 10 reps in supine. both   Lumbar Exercises: Seated   Other Seated Lumbar Exercises hip walking forward reverse multiple cues.   Lumbar Exercises: Supine   Clam 10 reps   Bent Knee Raise 10 reps  2 sets each   Bridge 10 reps   Other Supine Lumbar Exercises AAROM hip  ER, ABD, Flexion    Other Supine Lumbar Exercises Abdominal brace                 PT Education - 09/06/14 1734    Education provided Yes   Education Details importance of wearing the brace for walking.  May take off  for exercise, for safety reasons.   Person(s) Educated Patient   Methods Explanation   Comprehension Verbalized understanding          PT Short Term Goals - 08/21/14 1300    PT SHORT TERM GOAL #1   Title pt will be I with basic HEP 08/23/2014   Time 4   Period Weeks   Status On-going   PT SHORT TERM GOAL #2   Title pt will increase overall trunk mobilty by 10 degrees in all planes to assist with ADLs 08/23/2014   Time 4   Period Weeks   Status On-going   PT SHORT TERM GOAL #3   Title pt will increase Bil LE strength to 3/5 to help promote safety during ambulatory activities. 08/23/2014   Time 4   Period Weeks   Status On-going   PT SHORT TERM GOAL #4  Title pt will decrease pain to <6/10 during and following standing / walking for >10 minutes to assist with endurance and safety during weight bearing activities 08/23/2014   Time 4   Period Weeks   Status On-going   PT SHORT TERM GOAL #5   Title pt will increase FOTO score by >8 points to assist with functional capacity 08/23/2014)   Time 4   Period Weeks   Status On-going   PT SHORT TERM GOAL #6   Title pt will be able to verbalize and demonstrate proper transfer techniques from supine <>sit and bed mobility to help protect the lumbar spine and derease pain 08/23/2014   Time 4   Period Weeks   Status On-going           PT Long Term Goals - 08/21/14 1301    PT LONG TERM GOAL #1   Title pt will be I with Advanced HEP 09/22/2014   Time 8   Period Weeks   Status On-going   PT LONG TERM GOAL #2   Title pt will increase trunk mobility by > 20 degrees in all planes to assist wtih safety during driving and ADLS. 03/24/9415   Time 8   Period Weeks   Status On-going   PT LONG TERM GOAL #3   Title pt will  increase bil LE strength to >4/5 strength to help with climbing >10 steps and safety using LRAD during weight bearing activities 09/22/2014   Time 8   Period Weeks   Status On-going   PT LONG TERM GOAL #4   Title pt will demonstrate <4/10 pain during standing / walking for >20 minutes to help with endurance and ADLs 09/22/2014   Time 8   Period Weeks   Status On-going   PT LONG TERM GOAL #5   Title pt will be able to verbalize and demonstrate technqiues to reduce risk of reinjury of the low back by postural awareness, lifting and carrying mechanics, and HEP 09/22/2014   Time 8   Period Weeks   Status On-going               Plan - 09/06/14 1736    Clinical Impression Statement basic movement patterns in sitting most helpful with gait.  Patient's  risk for falls increase if she does not wear the brace.        Problem List Patient Active Problem List   Diagnosis Date Noted  . Weakness 11/03/2012    Gaynell Eggleton 09/06/2014, 5:46 PM  [facility] [address] [place], [place], [address] Phone: [phone]   Fax:  [phone]     Melvenia Needles, PTA 09/06/2014 5:46 PM Phone: [phone] Fax: [phone]

## 2014-09-07 DIAGNOSIS — M1611 Unilateral primary osteoarthritis, right hip: Secondary | ICD-10-CM | POA: Diagnosis not present

## 2014-09-07 DIAGNOSIS — M25551 Pain in right hip: Secondary | ICD-10-CM | POA: Diagnosis not present

## 2014-09-07 DIAGNOSIS — M6281 Muscle weakness (generalized): Secondary | ICD-10-CM | POA: Diagnosis not present

## 2014-09-10 DIAGNOSIS — M25551 Pain in right hip: Secondary | ICD-10-CM | POA: Diagnosis not present

## 2014-09-10 DIAGNOSIS — M79671 Pain in right foot: Secondary | ICD-10-CM | POA: Diagnosis not present

## 2014-09-10 DIAGNOSIS — M5416 Radiculopathy, lumbar region: Secondary | ICD-10-CM | POA: Diagnosis not present

## 2014-09-10 DIAGNOSIS — M25552 Pain in left hip: Secondary | ICD-10-CM | POA: Diagnosis not present

## 2014-09-11 ENCOUNTER — Ambulatory Visit: Payer: Medicare Other | Admitting: Physical Therapy

## 2014-09-11 DIAGNOSIS — M545 Low back pain: Secondary | ICD-10-CM | POA: Diagnosis not present

## 2014-09-11 DIAGNOSIS — R29898 Other symptoms and signs involving the musculoskeletal system: Secondary | ICD-10-CM | POA: Diagnosis not present

## 2014-09-11 DIAGNOSIS — M2560 Stiffness of unspecified joint, not elsewhere classified: Secondary | ICD-10-CM | POA: Diagnosis not present

## 2014-09-11 NOTE — Therapy (Signed)
[place], [place], [address] Phone: [phone]   Fax:  [phone]  Physical Therapy Treatment  Patient Details  Name: [name] MRN: [id number] Date of Birth: 1945-08-30 Referring Provider:  Merrilee Seashore, MD  Encounter Date: 09/11/2014      PT End of Session - 09/11/14 1607    Visit Number 13   Number of Visits 16   Date for PT Re-Evaluation 09/22/14   PT Start Time 1504   PT Stop Time 1550   PT Time Calculation (min) 46 min   Activity Tolerance Patient tolerated treatment well   Behavior During Therapy [facility] for tasks assessed/performed      Past Medical History  Diagnosis Date  . Arthritis     Past Surgical History  Procedure Laterality Date  . Neck surgery  2010    cerv disc fused   . Colonoscopy    . Bunionectomy  2013    rt foot  . Abdominal hysterectomy  1995  . Muscle biopsy Left 10/03/2012    Procedure: LEFT QUADRICEP MUSCLE BIOPSY;  Surgeon: Odis Hollingshead, MD;  Location: [facility];  Service: General;  Laterality: Left;    There were no vitals filed for this visit.  Visit Diagnosis:  Weakness of both legs      Subjective Assessment - 09/11/14 1504    Subjective Had injections eack side of low back.. Feeling returning shin,  Rt foot not as numb.  Sleeping better with wedge and pillow.  moving leg , rt better.    Currently in Pain? Yes   Pain Score 5    Pain Location Knee   Pain Orientation Left;Right   Pain Descriptors / Indicators Numbness  knee stinging Lt side, RT foot not as numb   Pain Relieving Factors Steriod shots   Multiple Pain Sites No                         [facility] Adult PT Treatment/Exercise - 09/11/14 0001    Ambulation/Gait   Gait Comments in parallel bars, work on weight shifting, rotation, hjip position,  Knee flexion assisted Knee from pop back into extension.encouraged use of AFO.   Lumbar Exercises: Seated    Other Seated Lumbar Exercises hip walking forward reverse multiple cues.  improved forward movements. RT.  Worked hip walking Rt/LT     Lumbar Exercises: Sidelying   Other Sidelying Lumbar Exercises Roller RT leg for PNF Rt hip AA then active                  PT Short Term Goals - 09/11/14 1614    PT SHORT TERM GOAL #1   Title pt will be I with basic HEP 08/23/2014   Time 4   Period Weeks   Status Achieved   PT SHORT TERM GOAL #3   Title pt will increase Bil LE strength to 3/5 to help promote safety during ambulatory activities. 08/23/2014   Time 4   Period Weeks   Status On-going   PT SHORT TERM GOAL #4   Title pt will decrease pain to <6/10 during and following standing / walking for >10 minutes to assist with endurance and safety during weight bearing activities 08/23/2014   Baseline No pain with walking,  Duration not assessed.   Period Weeks   Status On-going   PT SHORT TERM GOAL #5   Title pt will increase FOTO score by >8  points to assist with functional capacity 08/23/2014)   Time 4   Period Weeks   Status Unable to assess   PT SHORT TERM GOAL #6   Title pt will be able to verbalize and demonstrate proper transfer techniques from supine <>sit and bed mobility to help protect the lumbar spine and derease pain 08/23/2014   Time 4   Period Weeks   Status Achieved           PT Long Term Goals - 08/21/14 1301    PT LONG TERM GOAL #1   Title pt will be I with Advanced HEP 09/22/2014   Time 8   Period Weeks   Status On-going   PT LONG TERM GOAL #2   Title pt will increase trunk mobility by > 20 degrees in all planes to assist wtih safety during driving and ADLS. 05/28/3426   Time 8   Period Weeks   Status On-going   PT LONG TERM GOAL #3   Title pt will increase bil LE strength to >4/5 strength to help with climbing >10 steps and safety using LRAD during weight bearing activities 09/22/2014   Time 8   Period Weeks   Status On-going   PT LONG TERM GOAL #4   Title  pt will demonstrate <4/10 pain during standing / walking for >20 minutes to help with endurance and ADLs 09/22/2014   Time 8   Period Weeks   Status On-going   PT LONG TERM GOAL #5   Title pt will be able to verbalize and demonstrate technqiues to reduce risk of reinjury of the low back by postural awareness, lifting and carrying mechanics, and HEP 09/22/2014   Time 8   Period Weeks   Status On-going               Plan - 09/11/14 1615    Clinical Impression Statement --  STG#6, #1 met.      Injections and mat work helpful.  Patient having less distal numbness.  Legs easier to move. During gait and during bed mobility.  Next visit.  FOTO?  Gait,  hip strengthening. core  Problem List Patient Active Problem List   Diagnosis Date Noted  . Weakness 11/03/2012    [name],[name] 09/11/2014, 4:23 PM  [facility] [address] [place], [place], [address] Phone: [phone]   Fax:  [phone]  Melvenia Needles, PTA 09/11/2014 4:23 PM Phone: [phone] Fax: [phone]

## 2014-09-12 ENCOUNTER — Ambulatory Visit: Payer: Medicare Other | Admitting: Physical Therapy

## 2014-09-12 DIAGNOSIS — M5386 Other specified dorsopathies, lumbar region: Secondary | ICD-10-CM

## 2014-09-12 DIAGNOSIS — M2560 Stiffness of unspecified joint, not elsewhere classified: Secondary | ICD-10-CM | POA: Diagnosis not present

## 2014-09-12 DIAGNOSIS — M545 Low back pain, unspecified: Secondary | ICD-10-CM

## 2014-09-12 DIAGNOSIS — R29898 Other symptoms and signs involving the musculoskeletal system: Secondary | ICD-10-CM

## 2014-09-12 NOTE — Patient Instructions (Signed)
   Jorgia Manthei PT, DPT, LAT, ATC  [facility] Phone: [phone]     

## 2014-09-12 NOTE — Therapy (Signed)
[place], [place], [address] Phone: [phone]   Fax:  [phone]  Physical Therapy Treatment  Patient Details  Name: [name] MRN: [id number] Date of Birth: 03/08/1946 Referring Provider:  Merrilee Seashore, MD  Encounter Date: 09/12/2014      PT End of Session - 09/11/14 1607    Visit Number 13   Number of Visits 16   Date for PT Re-Evaluation 09/22/14   PT Start Time 1504   PT Stop Time 1550   PT Time Calculation (min) 46 min   Activity Tolerance Patient tolerated treatment well   Behavior During Therapy [facility] for tasks assessed/performed      Past Medical History  Diagnosis Date  . Arthritis     Past Surgical History  Procedure Laterality Date  . Neck surgery  2010    cerv disc fused   . Colonoscopy    . Bunionectomy  2013    rt foot  . Abdominal hysterectomy  1995  . Muscle biopsy Left 10/03/2012    Procedure: LEFT QUADRICEP MUSCLE BIOPSY;  Surgeon: Odis Hollingshead, MD;  Location: [facility];  Service: General;  Laterality: Left;    There were no vitals filed for this visit.  Visit Diagnosis:  Weakness of both legs  Joint stiffness of spine  Decreased ROM of lumbar spine  Bilateral low back pain without sciatica      Subjective Assessment - 09/12/14 1338    Subjective "I've had no problems since the last visit. she reports using her AFO today for support and drove her by herself today.    Currently in Pain? No/denies   Pain Score 0-No pain            [facility] PT Assessment - 09/12/14 1354    Observation/Other Assessments   Focus on Therapeutic Outcomes (FOTO)  54% limited   AROM   Lumbar Flexion 50   Lumbar Extension 12   Lumbar - Right Side Bend 30   Lumbar - Left Side Bend 30                     [facility] Adult PT Treatment/Exercise - 09/12/14 0001    Lumbar Exercises: Aerobic   Stationary Bike Nustep, level 7 X 10 minutes   Lumbar Exercises: Seated   Other Seated Lumbar Exercises hip walking forward reverse multiple cues.  2 x ea. dir   Other Seated Lumbar Exercises seated marching with theraband x 15   red theraband   Lumbar Exercises: Supine   Bridge 10 reps;1 second  with ball between knees for x 1 set, 1 x with sustained clam   Knee/Hip Exercises: Standing   Step Down Both;2 sets;Step Height: 6"   Wall Squat 5 reps;2 sets                PT Education - 09/12/14 1415    Education provided Yes   Education Details added wall squats to HEP   Person(s) Educated Patient   Methods Explanation   Comprehension Verbalized understanding          PT Short Term Goals - 09/12/14 1456    PT SHORT TERM GOAL #1   Title pt will be I with basic HEP 08/23/2014   Time 4   Period Weeks   Status Achieved   PT SHORT TERM GOAL #2   Title pt will increase overall trunk mobilty by 10 degrees in all planes to assist  with ADLs 08/23/2014   Time 4   Period Weeks   Status Achieved   PT SHORT TERM GOAL #3   Title pt will increase Bil LE strength to 3/5 to help promote safety during ambulatory activities. 08/23/2014   Time 4   Period Weeks   Status On-going   PT SHORT TERM GOAL #4   Title pt will decrease pain to <6/10 during and following standing / walking for >10 minutes to assist with endurance and safety during weight bearing activities 08/23/2014   Baseline No pain with walking,  Duration not assessed.   Time 4   Period Weeks   Status Achieved   PT SHORT TERM GOAL #5   Title pt will increase FOTO score by >8 points to assist with functional capacity 08/23/2014)   Time 4   Period Weeks   Status Achieved   PT SHORT TERM GOAL #6   Title pt will be able to verbalize and demonstrate proper transfer techniques from supine <>sit and bed mobility to help protect the lumbar spine and derease pain 08/23/2014   Time 4   Period Weeks   Status Achieved           PT Long Term Goals - 08/21/14 1301    PT LONG TERM  GOAL #1   Title pt will be I with Advanced HEP 09/22/2014   Time 8   Period Weeks   Status On-going   PT LONG TERM GOAL #2   Title pt will increase trunk mobility by > 20 degrees in all planes to assist wtih safety during driving and ADLS. 08/25/7844   Time 8   Period Weeks   Status On-going   PT LONG TERM GOAL #3   Title pt will increase bil LE strength to >4/5 strength to help with climbing >10 steps and safety using LRAD during weight bearing activities 09/22/2014   Time 8   Period Weeks   Status On-going   PT LONG TERM GOAL #4   Title pt will demonstrate <4/10 pain during standing / walking for >20 minutes to help with endurance and ADLs 09/22/2014   Time 8   Period Weeks   Status On-going   PT LONG TERM GOAL #5   Title pt will be able to verbalize and demonstrate technqiues to reduce risk of reinjury of the low back by postural awareness, lifting and carrying mechanics, and HEP 09/22/2014   Time 8   Period Weeks   Status On-going               Plan - 09/12/14 1457    Clinical Impression Statement [name] presents to therapy today wearing her AFO and ambulating with her SPC. she continues to make progress with lumbar AROM and decreased pain, as well as R LE advancement during walking. She reports that she wants to continue with another round of PT when she gets re-evaluated in the next few visits to continue with the progress she has made.    PT Next Visit Plan Continue gait, pelvic stabiliztion, hip strengthening.        Problem List Patient Active Problem List   Diagnosis Date Noted  . Weakness 11/03/2012   [place] PT, DPT, LAT, ATC  09/12/2014  3:01 PM   [facility] [address] [place], [place], [address] Phone: [phone]   Fax:  [phone]

## 2014-09-13 DIAGNOSIS — M6281 Muscle weakness (generalized): Secondary | ICD-10-CM | POA: Diagnosis not present

## 2014-09-13 DIAGNOSIS — M1990 Unspecified osteoarthritis, unspecified site: Secondary | ICD-10-CM | POA: Diagnosis not present

## 2014-09-26 DIAGNOSIS — N3941 Urge incontinence: Secondary | ICD-10-CM | POA: Diagnosis not present

## 2014-09-27 DIAGNOSIS — M25562 Pain in left knee: Secondary | ICD-10-CM | POA: Diagnosis not present

## 2014-10-01 ENCOUNTER — Ambulatory Visit: Payer: Medicare Other | Attending: Physician Assistant | Admitting: Physical Therapy

## 2014-10-01 DIAGNOSIS — R29898 Other symptoms and signs involving the musculoskeletal system: Secondary | ICD-10-CM | POA: Insufficient documentation

## 2014-10-01 DIAGNOSIS — M545 Low back pain: Secondary | ICD-10-CM | POA: Insufficient documentation

## 2014-10-01 DIAGNOSIS — M2560 Stiffness of unspecified joint, not elsewhere classified: Secondary | ICD-10-CM | POA: Insufficient documentation

## 2014-10-03 ENCOUNTER — Ambulatory Visit: Payer: Medicare Other | Admitting: Physical Therapy

## 2014-10-03 DIAGNOSIS — M25562 Pain in left knee: Secondary | ICD-10-CM | POA: Diagnosis not present

## 2014-10-08 ENCOUNTER — Ambulatory Visit: Payer: Medicare Other | Admitting: Physical Therapy

## 2014-10-08 DIAGNOSIS — R29898 Other symptoms and signs involving the musculoskeletal system: Secondary | ICD-10-CM

## 2014-10-08 DIAGNOSIS — M2560 Stiffness of unspecified joint, not elsewhere classified: Secondary | ICD-10-CM

## 2014-10-08 DIAGNOSIS — M545 Low back pain: Secondary | ICD-10-CM | POA: Diagnosis not present

## 2014-10-08 DIAGNOSIS — M5386 Other specified dorsopathies, lumbar region: Secondary | ICD-10-CM

## 2014-10-08 NOTE — Therapy (Signed)
[place], [place], [address] Phone: [phone]   Fax:  [phone]  Physical Therapy Treatment  Patient Details  Name: [name] MRN: [id number] Date of Birth: 05-26-1945 Referring Provider:  Merrilee Seashore, MD  Encounter Date: 10/08/2014      PT End of Session - 10/08/14 1302    Visit Number 14   Number of Visits 16   Date for PT Re-Evaluation 09/22/14   PT Start Time 3546   PT Stop Time 1235   PT Time Calculation (min) 42 min   Activity Tolerance Patient tolerated treatment well;Patient limited by pain  knee limits today LT   Behavior During Therapy [facility] for tasks assessed/performed      Past Medical History  Diagnosis Date  . Arthritis     Past Surgical History  Procedure Laterality Date  . Neck surgery  2010    cerv disc fused   . Colonoscopy    . Bunionectomy  2013    rt foot  . Abdominal hysterectomy  1995  . Muscle biopsy Left 10/03/2012    Procedure: LEFT QUADRICEP MUSCLE BIOPSY;  Surgeon: Odis Hollingshead, MD;  Location: [facility];  Service: General;  Laterality: Left;    There were no vitals filed for this visit.  Visit Diagnosis:  Weakness of both legs  Joint stiffness of spine  Decreased ROM of lumbar spine      Subjective Assessment - 10/08/14 1155    Subjective the week after the 4th saw MD .  He said to wear Back brace... Had knee x Ray was fine.  10/10.  Back no pain.   Currently in Pain? Yes   Pain Score 10-Worst pain ever   Pain Location Knee   Pain Orientation Left;Anterior;Posterior;Lower   Pain Descriptors / Indicators --  constant, sting on bottom of foot.  Intermittant sharp.   Pain Radiating Towards lower leg and foot. Stings   Aggravating Factors  walking.    Pain Relieving Factors rubbing, ice heat.                         [facility] Adult PT Treatment/Exercise - 10/08/14 1210    Lumbar Exercises: Stretches   Pelvic Tilt Limitations 10 reps 5 second holds.   Lumbar Exercises: Supine   Bridge 10 reps   Bridge Limitations legs on ball   Other Supine Lumbar Exercises lower abdominal stabilization series 10 reps each with clams, march and slides Rt only due to painful Lt   Lumbar Exercises: Sidelying   Clam 10 reps  each   Hip Abduction 10 reps   Hip Abduction Limitations 4-/5 within available range both                  PT Short Term Goals - 09/12/14 1456    PT SHORT TERM GOAL #1   Title pt will be I with basic HEP 08/23/2014   Time 4   Period Weeks   Status Achieved   PT SHORT TERM GOAL #2   Title pt will increase overall trunk mobilty by 10 degrees in all planes to assist with ADLs 08/23/2014   Time 4   Period Weeks   Status Achieved   PT SHORT TERM GOAL #3   Title pt will increase Bil LE strength to 3/5 to help promote safety during ambulatory activities. 08/23/2014   Time 4   Period Weeks   Status On-going  PT SHORT TERM GOAL #4   Title pt will decrease pain to <6/10 during and following standing / walking for >10 minutes to assist with endurance and safety during weight bearing activities 08/23/2014   Baseline No pain with walking,  Duration not assessed.   Time 4   Period Weeks   Status Achieved   PT SHORT TERM GOAL #5   Title pt will increase FOTO score by >8 points to assist with functional capacity 08/23/2014)   Time 4   Period Weeks   Status Achieved   PT SHORT TERM GOAL #6   Title pt will be able to verbalize and demonstrate proper transfer techniques from supine <>sit and bed mobility to help protect the lumbar spine and derease pain 08/23/2014   Time 4   Period Weeks   Status Achieved           PT Long Term Goals - 08/21/14 1301    PT LONG TERM GOAL #1   Title pt will be I with Advanced HEP 09/22/2014   Time 8   Period Weeks   Status On-going   PT LONG TERM GOAL #2   Title pt will increase trunk mobility by > 20 degrees in all planes to assist wtih safety  during driving and ADLS. 11/22/6943   Time 8   Period Weeks   Status On-going   PT LONG TERM GOAL #3   Title pt will increase bil LE strength to >4/5 strength to help with climbing >10 steps and safety using LRAD during weight bearing activities 09/22/2014   Time 8   Period Weeks   Status On-going   PT LONG TERM GOAL #4   Title pt will demonstrate <4/10 pain during standing / walking for >20 minutes to help with endurance and ADLs 09/22/2014   Time 8   Period Weeks   Status On-going   PT LONG TERM GOAL #5   Title pt will be able to verbalize and demonstrate technqiues to reduce risk of reinjury of the low back by postural awareness, lifting and carrying mechanics, and HEP 09/22/2014   Time 8   Period Weeks   Status On-going               Plan - 10/08/14 1303    Clinical Impression Statement Hip abduction strength improving.  Patient has has a  set back with the injury of Lt knee 2 weeks ago.  she has had an MRI and gets the results tomorrow.  Exercises supine today to limit pain increase on knee.  No back pain  Patient is wearing her AFO.   PT Next Visit Plan Renewal.  See what MD says   Consulted and Agree with Plan of Care Patient        Problem List Patient Active Problem List   Diagnosis Date Noted  . Weakness 11/03/2012    [name],[name] 10/08/2014, 1:09 PM  [facility] [address] [place], [place], [address] Phone: [phone]   Fax:  [phone]     .Melvenia Needles, PTA 10/08/2014 1:09 PM Phone: [phone] Fax: [phone]

## 2014-10-08 NOTE — Patient Instructions (Signed)
Avoid stand and pivot, especially with getting in/out of cars.

## 2014-10-09 DIAGNOSIS — M25562 Pain in left knee: Secondary | ICD-10-CM | POA: Diagnosis not present

## 2014-10-09 DIAGNOSIS — M2242 Chondromalacia patellae, left knee: Secondary | ICD-10-CM | POA: Diagnosis not present

## 2014-10-10 ENCOUNTER — Ambulatory Visit: Payer: Medicare Other | Admitting: Physical Therapy

## 2014-10-10 DIAGNOSIS — M545 Low back pain, unspecified: Secondary | ICD-10-CM

## 2014-10-10 DIAGNOSIS — R29898 Other symptoms and signs involving the musculoskeletal system: Secondary | ICD-10-CM

## 2014-10-10 DIAGNOSIS — M2560 Stiffness of unspecified joint, not elsewhere classified: Secondary | ICD-10-CM | POA: Diagnosis not present

## 2014-10-10 DIAGNOSIS — M5386 Other specified dorsopathies, lumbar region: Secondary | ICD-10-CM

## 2014-10-10 NOTE — Therapy (Signed)
[place], [place], [address] Phone: [phone]   Fax:  [phone]  Physical Therapy Treatment  Patient Details  Name: [name] MRN: [id number] Date of Birth: [birth year] Referring Provider:  Merrilee Seashore, MD  Encounter Date: 10/10/2014      PT End of Session - 10/10/14 1300    Visit Number 15   Number of Visits 23   Date for PT Re-Evaluation 11/07/14   PT Start Time 0034   PT Stop Time 1230   PT Time Calculation (min) 45 min   Activity Tolerance Patient tolerated treatment well   Behavior During Therapy [facility] for tasks assessed/performed      Past Medical History  Diagnosis Date  . Arthritis     Past Surgical History  Procedure Laterality Date  . Neck surgery  2010    cerv disc fused   . Colonoscopy    . Bunionectomy  2013    rt foot  . Abdominal hysterectomy  1995  . Muscle biopsy Left 10/03/2012    Procedure: LEFT QUADRICEP MUSCLE BIOPSY;  Surgeon: Odis Hollingshead, MD;  Location: [facility];  Service: General;  Laterality: Left;    There were no vitals filed for this visit.  Visit Diagnosis:  Weakness of both legs - Plan: PT plan of care cert/re-cert  Joint stiffness of spine - Plan: PT plan of care cert/re-cert  Decreased ROM of lumbar spine - Plan: PT plan of care cert/re-cert  Bilateral low back pain without sciatica - Plan: PT plan of care cert/re-cert      Subjective Assessment - 10/10/14 1155    Subjective "I am feeling sore in my knee on the inside, I got the the results from the MRI which reported that everything was looked good in the knee and it may be coming from the back"   Currently in Pain? Yes   Pain Score 1    Pain Location Knee   Pain Orientation Left;Anterior;Medial   Pain Descriptors / Indicators Aching;Nagging   Pain Type Acute pain   Pain Onset 1 to 4 weeks ago   Pain Frequency Constant   Aggravating Factors  walking/ standing,     Pain Relieving Factors brace, rubbing   Multiple Pain Sites Yes   Pain Score 0   Pain Location Back   Aggravating Factors  bending over quickly   Pain Relieving Factors sitting and resting            [facility] PT Assessment - 10/10/14 1200    Assessment   Medical Diagnosis s/p lumbar fusion   Hand Dominance Right   Next MD Visit 09/09/2014   Prior Therapy yes   Precautions   Precautions Back   Restrictions   Weight Bearing Restrictions No   Balance Screen   Has the patient fallen in the past 6 months No   Has the patient had a decrease in activity level because of a fear of falling?  No   Is the patient reluctant to leave their home because of a fear of falling?  No   Home Environment   Living Environment Private residence   Prior Function   Level of Independence Independent with basic ADLs;Independent with homemaking with ambulation;Independent with gait;Independent with transfers   Vocation Retired   Visual merchandiser.    Observation/Other Assessments   Focus on Therapeutic Outcomes (FOTO)  59% limited   AROM   Lumbar Flexion 58   Lumbar Extension 14  Lumbar - Right Side Bend 38   Lumbar - Left Side Bend 36   Strength   Right Hip Flexion 3+/5   Right Hip Extension 3+/5   Right Hip ABduction 3/5   Right Hip ADduction 3/5   Left Hip Flexion 4/5   Left Hip Extension 3+/5   Left Hip ABduction 4-/5   Left Hip ADduction 4-/5   Right Knee Flexion 4-/5   Right Knee Extension 4/5   Left Knee Flexion 4/5   Left Knee Extension 4/5                     [facility] Adult PT Treatment/Exercise - 10/10/14 0001    Self-Care   Self-Care ADL's;Other Self-Care Comments   ADL's to take time when doing activities no need to rush and hurry to avoid making mistakes or injuries.   Other Self-Care Comments  Ambulating as normally as possible and to take rest breaks as needed to not stress the knee letting pain be her guide.   Lumbar Exercises: Stretches   Pelvic Tilt  Limitations 10 reps 5 second holds.  x 2 sets   Lumbar Exercises: Seated   Sit to Stand 10 reps                PT Education - 10/10/14 1300    Education provided Yes   Education Details updated POC, HEP review, education on how she is progressing   Person(s) Educated Patient   Methods Explanation   Comprehension Verbalized understanding          PT Short Term Goals - 10/10/14 1305    PT SHORT TERM GOAL #1   Title pt will be I with basic HEP 08/23/2014   Time 4   Period Weeks   Status Achieved   PT SHORT TERM GOAL #2   Title pt will increase overall trunk mobilty by 10 degrees in all planes to assist with ADLs 08/23/2014   Time 4   Period Weeks   Status Achieved   PT SHORT TERM GOAL #3   Title pt will increase Bil LE strength to 3/5 to help promote safety during ambulatory activities. 08/23/2014   Time 4   Period Weeks   Status Achieved   PT SHORT TERM GOAL #4   Title pt will decrease pain to <6/10 during and following standing / walking for >10 minutes to assist with endurance and safety during weight bearing activities 08/23/2014   Baseline No pain with walking,  Duration not assessed.   Time 4   Period Weeks   Status Achieved   PT SHORT TERM GOAL #5   Title pt will increase FOTO score by >8 points to assist with functional capacity 08/23/2014)   Time 4   Period Weeks   Status Achieved   PT SHORT TERM GOAL #6   Title pt will be able to verbalize and demonstrate proper transfer techniques from supine <>sit and bed mobility to help protect the lumbar spine and derease pain 08/23/2014   Time 4   Period Weeks   Status Achieved           PT Long Term Goals - 10/10/14 1305    PT LONG TERM GOAL #1   Title pt will be I with Advanced HEP 09/22/2014   Time 8   Period Weeks   Status On-going   PT LONG TERM GOAL #2   Title pt will increase trunk mobility by > 20 degrees in all planes to  assist wtih safety during driving and ADLS. 09/21/1655   Time 8   Period Weeks    Status On-going   PT LONG TERM GOAL #3   Title pt will increase bil LE strength to >4/5 strength to help with climbing >10 steps and safety using LRAD during weight bearing activities 09/22/2014   Time 8   Period Weeks   Status On-going   PT LONG TERM GOAL #4   Title pt will demonstrate <4/10 pain during standing / walking for >20 minutes to help with endurance and ADLs 09/22/2014   Time 8   Period Weeks   Status On-going   PT LONG TERM GOAL #5   Title pt will be able to verbalize and demonstrate technqiues to reduce risk of reinjury of the low back by postural awareness, lifting and carrying mechanics, and HEP 09/22/2014   Time 8   Period Weeks   Status On-going               Plan - November 01, 2014 1301    Clinical Impression Statement Bryleigh presents to therapy today with report of Left knee pain that has been going on for the last 2 weeks following a full squat to pick up a card from the ground. She met all STG, and LTG #5. According to pt MRI results of the knee were unremarkbale and pain could be coming from her back.  Her trunk mobility has increaed and bil LE strength continues to improve however the RLE continues to demonstrate increased weakness compared bil. spoke with pt about funcitonal mobility and the progress she is making and she seemed receptive and felt better after. Her knee is tender at the patellar tendon insertion and she now demonstrates and increased antalgic gait pattern with circumducted gait on the LLE.  She would benefit from continued therapy to further increase strength and mobility/balance for safety.    Pt will benefit from skilled therapeutic intervention in order to improve on the following deficits Abnormal gait;Impaired flexibility;Improper body mechanics;Postural dysfunction;Difficulty walking;Decreased safety awareness;Decreased balance;Increased muscle spasms;Decreased strength;Decreased range of motion;Decreased endurance;Decreased activity tolerance;Decreased  mobility   Rehab Potential Good   PT Frequency 2x / week   PT Duration 4 weeks   PT Treatment/Interventions ADLs/Self Care Home Management;Electrical Stimulation;Gait training;Therapeutic exercise;Patient/family education;Balance training;Stair training;Moist Heat;Manual techniques;Neuromuscular re-education;Functional mobility training;Cryotherapy;Ultrasound;DME Instruction;Therapeutic activities;Dry needling;Passive range of motion   PT Next Visit Plan progress hip/knee strengthening, gait training, balance training, modalities PRN   PT Home Exercise Plan HEP review   Consulted and Agree with Plan of Care Patient          G-Codes - 2014/11/01 1306    Functional Assessment Tool Used FOTO 59% limitation   Functional Limitation Mobility: Walking and moving around   Mobility: Walking and Moving Around Current Status [phone]) At least 40 percent but less than 60 percent impaired, limited or restricted   Mobility: Walking and Moving Around Goal Status [phone]) At least 40 percent but less than 60 percent impaired, limited or restricted      Problem List Patient Active Problem List   Diagnosis Date Noted  . Weakness 11/03/2012   Starr Lake PT, DPT, LAT, ATC  11-01-14  1:12 PM    [facility] [address] [place], [place], [address] Phone: [phone]   Fax:  [phone]

## 2014-10-15 ENCOUNTER — Encounter: Payer: PRIVATE HEALTH INSURANCE | Admitting: Physical Therapy

## 2014-10-17 ENCOUNTER — Ambulatory Visit: Payer: Medicare Other | Admitting: Physical Therapy

## 2014-10-17 DIAGNOSIS — R29898 Other symptoms and signs involving the musculoskeletal system: Secondary | ICD-10-CM

## 2014-10-17 DIAGNOSIS — M2560 Stiffness of unspecified joint, not elsewhere classified: Secondary | ICD-10-CM | POA: Diagnosis not present

## 2014-10-17 DIAGNOSIS — M545 Low back pain: Secondary | ICD-10-CM | POA: Diagnosis not present

## 2014-10-17 DIAGNOSIS — M5386 Other specified dorsopathies, lumbar region: Secondary | ICD-10-CM

## 2014-10-17 NOTE — Therapy (Signed)
[place], [place], [address] Phone: [phone]   Fax:  [phone]  Physical Therapy Treatment  Patient Details  Name: STACI DACK MRN: [id number] Date of Birth: 1945/09/14 Referring Provider:  Merrilee Seashore, MD  Encounter Date: 10/17/2014      PT End of Session - 10/17/14 1646    Visit Number 16   Number of Visits 23   Date for PT Re-Evaluation 11/07/14   PT Start Time 3235   PT Stop Time 1347   PT Time Calculation (min) 42 min   Activity Tolerance Patient tolerated treatment well   Behavior During Therapy [facility] for tasks assessed/performed      Past Medical History  Diagnosis Date  . Arthritis     Past Surgical History  Procedure Laterality Date  . Neck surgery  2010    cerv disc fused   . Colonoscopy    . Bunionectomy  2013    rt foot  . Abdominal hysterectomy  1995  . Muscle biopsy Left 10/03/2012    Procedure: LEFT QUADRICEP MUSCLE BIOPSY;  Surgeon: Odis Hollingshead, MD;  Location: [facility];  Service: General;  Laterality: Left;    There were no vitals filed for this visit.  Visit Diagnosis:  Weakness of both legs  Decreased ROM of lumbar spine      Subjective Assessment - 10/17/14 1519    Subjective No knee pain.  Went to [place] knee isnot injured.     Currently in Pain? No/denies   Multiple Pain Sites No                         [facility] Adult PT Treatment/Exercise - 10/17/14 1510    Lumbar Exercises: Supine   Heel Slides 10 reps  feet on orange ball   Bridge 10 reps  legs on ball   Bridge Limitations Also feet on mat with ball squeeze   Straight Leg Raise --  2 sets of 3 , legs on ball RT, LT 10 reps off ball orange   Straight Leg Raises Limitations AA initiall off ball RT the able to do independently small lifts eccentric lowering.   Lumbar Exercises: Sidelying   Clam 10 reps  each   Hip Abduction 10 reps  each                   PT Short Term Goals - 10/10/14 1305    PT SHORT TERM GOAL #1   Title pt will be I with basic HEP 08/23/2014   Time 4   Period Weeks   Status Achieved   PT SHORT TERM GOAL #2   Title pt will increase overall trunk mobilty by 10 degrees in all planes to assist with ADLs 08/23/2014   Time 4   Period Weeks   Status Achieved   PT SHORT TERM GOAL #3   Title pt will increase Bil LE strength to 3/5 to help promote safety during ambulatory activities. 08/23/2014   Time 4   Period Weeks   Status Achieved   PT SHORT TERM GOAL #4   Title pt will decrease pain to <6/10 during and following standing / walking for >10 minutes to assist with endurance and safety during weight bearing activities 08/23/2014   Baseline No pain with walking,  Duration not assessed.   Time 4   Period Weeks   Status Achieved   PT SHORT TERM GOAL #5  Title pt will increase FOTO score by >8 points to assist with functional capacity 08/23/2014)   Time 4   Period Weeks   Status Achieved   PT SHORT TERM GOAL #6   Title pt will be able to verbalize and demonstrate proper transfer techniques from supine <>sit and bed mobility to help protect the lumbar spine and derease pain 08/23/2014   Time 4   Period Weeks   Status Achieved           PT Long Term Goals - 10/10/14 1305    PT LONG TERM GOAL #1   Title pt will be I with Advanced HEP 09/22/2014   Time 8   Period Weeks   Status On-going   PT LONG TERM GOAL #2   Title pt will increase trunk mobility by > 20 degrees in all planes to assist wtih safety during driving and ADLS. 2/0/3559   Time 8   Period Weeks   Status On-going   PT LONG TERM GOAL #3   Title pt will increase bil LE strength to >4/5 strength to help with climbing >10 steps and safety using LRAD during weight bearing activities 09/22/2014   Time 8   Period Weeks   Status On-going   PT LONG TERM GOAL #4   Title pt will demonstrate <4/10 pain during standing / walking for >20 minutes to  help with endurance and ADLs 09/22/2014   Time 8   Period Weeks   Status On-going   PT LONG TERM GOAL #5   Title pt will be able to verbalize and demonstrate technqiues to reduce risk of reinjury of the low back by postural awareness, lifting and carrying mechanics, and HEP 09/22/2014   Time 8   Period Weeks   Status On-going               Plan - 10/17/14 1646    Clinical Impression Statement Very fatigued.  Knee pain resolved.  Lower level stabilization supine today to fatigue.  Emotional support given.     PT Next Visit Plan progress hip/knee strengthening, gait training, balance training, modalities PRN   Consulted and Agree with Plan of Care Patient        Problem List Patient Active Problem List   Diagnosis Date Noted  . Weakness 11/03/2012    [name],[name] 10/17/2014, 4:49 PM  [facility] [address] [place], [place], [address] Phone: [phone]   Fax:  [phone]     Melvenia Needles, PTA 10/17/2014 4:49 PM Phone: [phone] Fax: [phone]

## 2014-10-17 NOTE — Patient Instructions (Signed)
Continue wearing AFO with gait for safety.

## 2014-10-22 ENCOUNTER — Ambulatory Visit: Payer: Medicare Other | Admitting: Physical Therapy

## 2014-10-22 ENCOUNTER — Ambulatory Visit: Payer: Medicare Other | Attending: Physician Assistant | Admitting: Physical Therapy

## 2014-10-22 DIAGNOSIS — M5386 Other specified dorsopathies, lumbar region: Secondary | ICD-10-CM

## 2014-10-22 DIAGNOSIS — R29898 Other symptoms and signs involving the musculoskeletal system: Secondary | ICD-10-CM | POA: Diagnosis not present

## 2014-10-22 DIAGNOSIS — M2560 Stiffness of unspecified joint, not elsewhere classified: Secondary | ICD-10-CM | POA: Diagnosis not present

## 2014-10-22 DIAGNOSIS — M545 Low back pain: Secondary | ICD-10-CM | POA: Insufficient documentation

## 2014-10-22 NOTE — Therapy (Signed)
[place], [place], [address] Phone: [phone]   Fax:  [phone]  Physical Therapy Treatment  Patient Details  Name: [name] MRN: [id number] Date of Birth: 07-03-1945 Referring Provider:  Merrilee Seashore, MD  Encounter Date: 10/22/2014      PT End of Session - 10/22/14 1238    Visit Number 17   Number of Visits 23   Date for PT Re-Evaluation 11/07/14   PT Start Time 1140   PT Stop Time 1230   PT Time Calculation (min) 50 min   Activity Tolerance Patient tolerated treatment well   Behavior During Therapy [facility] for tasks assessed/performed      Past Medical History  Diagnosis Date  . Arthritis     Past Surgical History  Procedure Laterality Date  . Neck surgery  2010    cerv disc fused   . Colonoscopy    . Bunionectomy  2013    rt foot  . Abdominal hysterectomy  1995  . Muscle biopsy Left 10/03/2012    Procedure: LEFT QUADRICEP MUSCLE BIOPSY;  Surgeon: Odis Hollingshead, MD;  Location: [facility];  Service: General;  Laterality: Left;    There were no vitals filed for this visit.  Visit Diagnosis:  Weakness of both legs  Decreased ROM of lumbar spine  Joint stiffness of spine      Subjective Assessment - 10/22/14 1148    Subjective "the knee is feeling stiff today, but I have no pain"   Currently in Pain? No/denies   Pain Score 0-No pain   Pain Location Back   Aggravating Factors  walking/ standing                         [facility] Adult PT Treatment/Exercise - 10/22/14 1150    Self-Care   Self-Care ADL's;Other Self-Care Comments   Other Self-Care Comments  taking smaller steps to decrease additional extraneous movement during ambulation to help decrease energy expenditure while walking.    Lumbar Exercises: Stretches   Passive Hamstring Stretch 2 reps;30 seconds   Single Knee to Chest Stretch 2 reps;30 seconds   Lumbar Exercises: Aerobic    Stationary Bike NuStep L7 x 10    Lumbar Exercises: Standing   Other Standing Lumbar Exercises side stepping 5 x in //, standing hip extension/abd 2 x 10 with 3#, step-ups on 4 in step in // bars, gait training with focus on exaggerated heel strike/ walking in // for safety without holding on for dynamic balance x 4   Other Standing Lumbar Exercises walking/marching //   Lumbar Exercises: Seated   Sit to Stand 10 reps  2 sets   Other Seated Lumbar Exercises LAQ  2 x 15 5#   Lumbar Exercises: Supine   Bridge 15 reps  with ball between knees   Straight Leg Raise 15 reps   Knee/Hip Exercises: Standing   Other Standing Knee Exercises Hip side steps with out using hands in // with close SBA                PT Education - 10/22/14 1237    Education provided Yes   Education Details walking taking smaller steps to avoid extraneous movements to increase safety and decrease energey expenditure   Person(s) Educated Patient   Methods Explanation   Comprehension Verbalized understanding          PT Short Term Goals - 10/10/14 1305  PT SHORT TERM GOAL #1   Title pt will be I with basic HEP 08/23/2014   Time 4   Period Weeks   Status Achieved   PT SHORT TERM GOAL #2   Title pt will increase overall trunk mobilty by 10 degrees in all planes to assist with ADLs 08/23/2014   Time 4   Period Weeks   Status Achieved   PT SHORT TERM GOAL #3   Title pt will increase Bil LE strength to 3/5 to help promote safety during ambulatory activities. 08/23/2014   Time 4   Period Weeks   Status Achieved   PT SHORT TERM GOAL #4   Title pt will decrease pain to <6/10 during and following standing / walking for >10 minutes to assist with endurance and safety during weight bearing activities 08/23/2014   Baseline No pain with walking,  Duration not assessed.   Time 4   Period Weeks   Status Achieved   PT SHORT TERM GOAL #5   Title pt will increase FOTO score by >8 points to assist with functional  capacity 08/23/2014)   Time 4   Period Weeks   Status Achieved   PT SHORT TERM GOAL #6   Title pt will be able to verbalize and demonstrate proper transfer techniques from supine <>sit and bed mobility to help protect the lumbar spine and derease pain 08/23/2014   Time 4   Period Weeks   Status Achieved           PT Long Term Goals - 10/22/14 1243    PT LONG TERM GOAL #1   Title pt will be I with Advanced HEP 09/22/2014   Period Weeks   Status On-going   PT LONG TERM GOAL #2   Title pt will increase trunk mobility by > 20 degrees in all planes to assist wtih safety during driving and ADLS. 9/0/2409   Time 8   Period Weeks   Status On-going   PT LONG TERM GOAL #3   Title pt will increase bil LE strength to >4/5 strength to help with climbing >10 steps and safety using LRAD during weight bearing activities 09/22/2014   Time 8   Period Weeks   Status On-going   PT LONG TERM GOAL #4   Title pt will demonstrate <4/10 pain during standing / walking for >20 minutes to help with endurance and ADLs 09/22/2014   Time 8   Period Weeks   Status On-going   PT LONG TERM GOAL #5   Title pt will be able to verbalize and demonstrate technqiues to reduce risk of reinjury of the low back by postural awareness, lifting and carrying mechanics, and HEP 09/22/2014   Time 8   Period Weeks   Status On-going               Plan - 10/22/14 1238    Clinical Impression Statement [name] states her knee is feeling better today and hasn't had any problems for a little while now. While walking it pt caught her R foot a couple of times on the ground causing her to grab onto the wall for support. She performed all exercises without complainted of pain but reported fatigue follwing standing hip extensions/marching, and abduction. Edcuated pt to take smaller steps to avoid increased extraneous movement during gait to avoid excessive energy expenditure. While taking smaller steps she was able to ambulate with less of  a limp and antalgic gait pattern. Plan to progress with standing exercises  and gait training as tolerated.    PT Next Visit Plan progress hip/knee strengthening, gait training, balance training, modalities PRN   PT Home Exercise Plan taking smaller steps during walking   Consulted and Agree with Plan of Care Patient        Problem List Patient Active Problem List   Diagnosis Date Noted  . Weakness 11/03/2012   Starr Lake PT, DPT, LAT, ATC  10/22/2014  12:47 PM    [facility] [address] [place], [place], [address] Phone: [phone]   Fax:  [phone]

## 2014-10-24 ENCOUNTER — Ambulatory Visit: Payer: Medicare Other | Admitting: Physical Therapy

## 2014-10-24 DIAGNOSIS — R29898 Other symptoms and signs involving the musculoskeletal system: Secondary | ICD-10-CM | POA: Diagnosis not present

## 2014-10-24 DIAGNOSIS — M545 Low back pain, unspecified: Secondary | ICD-10-CM

## 2014-10-24 DIAGNOSIS — M2560 Stiffness of unspecified joint, not elsewhere classified: Secondary | ICD-10-CM | POA: Diagnosis not present

## 2014-10-24 DIAGNOSIS — M5386 Other specified dorsopathies, lumbar region: Secondary | ICD-10-CM

## 2014-10-24 NOTE — Therapy (Signed)
[place], [place], [address] Phone: [phone]   Fax:  [phone]  Physical Therapy Treatment  Patient Details  Name: [name] MRN: [id number] Date of Birth: 06/06/1945 Referring Provider:  Merrilee Seashore, MD  Encounter Date: 10/24/2014      PT End of Session - 10/24/14 1239    Visit Number 18   Number of Visits 23   Date for PT Re-Evaluation 11/07/14   PT Start Time 3009   PT Stop Time 1230   PT Time Calculation (min) 45 min   Activity Tolerance Patient tolerated treatment well   Behavior During Therapy [facility] for tasks assessed/performed      Past Medical History  Diagnosis Date  . Arthritis     Past Surgical History  Procedure Laterality Date  . Neck surgery  2010    cerv disc fused   . Colonoscopy    . Bunionectomy  2013    rt foot  . Abdominal hysterectomy  1995  . Muscle biopsy Left 10/03/2012    Procedure: LEFT QUADRICEP MUSCLE BIOPSY;  Surgeon: Odis Hollingshead, MD;  Location: [place];  Service: General;  Laterality: Left;    There were no vitals filed for this visit.  Visit Diagnosis:  Weakness of both legs  Decreased ROM of lumbar spine  Joint stiffness of spine  Bilateral low back pain without sciatica      Subjective Assessment - 10/24/14 1150    Subjective "my back is feeling good today, the knee is feeling a little sore with some burning but it isn't bad" pt reports practicing on walking with slow and efficent gait pattern and been having more energy    Currently in Pain? No/denies   Pain Score 0-No pain   Pain Location Back   Pain Orientation Left;Anterior;Medial                         [facility] Adult PT Treatment/Exercise - 10/24/14 1154    Self-Care   Other Self-Care Comments  taking smaller steps to decrease additional extraneous movement during ambulation to help decrease energy expenditure while walking.  To also work  on avoiding a cross-over gait pattern to avoid getting her feet caught or tripping.   Lumbar Exercises: Aerobic   Stationary Bike NuStep L7 x 10    Lumbar Exercises: Seated   Sit to Stand 10 reps   Sit to Stand Limitations continues to dmeonstrate hip adduction during eccentric lowering to increase stability requiring VC to decrease.   Other Seated Lumbar Exercises seated marching and LAQ with 3#  x 15 while sitting on dynadisc  VC to maintain core tightness   Lumbar Exercises: Supine   Bent Knee Raise 10 reps  3 sets, with VC to pull belly button into spine   Bridge 15 reps   Other Supine Lumbar Exercises log rolling keeping core tightx 10 to ea side. mild abdominal brace by pushing physoball between arms and knees,                 PT Education - 10/24/14 1237    Education provided Yes   Education Details Education about post therapy class with Santiago Glad PTA    Person(s) Educated Patient   Methods Explanation   Comprehension Verbalized understanding          PT Short Term Goals - 10/10/14 1305    PT SHORT TERM GOAL #1   Title  pt will be I with basic HEP 08/23/2014   Time 4   Period Weeks   Status Achieved   PT SHORT TERM GOAL #2   Title pt will increase overall trunk mobilty by 10 degrees in all planes to assist with ADLs 08/23/2014   Time 4   Period Weeks   Status Achieved   PT SHORT TERM GOAL #3   Title pt will increase Bil LE strength to 3/5 to help promote safety during ambulatory activities. 08/23/2014   Time 4   Period Weeks   Status Achieved   PT SHORT TERM GOAL #4   Title pt will decrease pain to <6/10 during and following standing / walking for >10 minutes to assist with endurance and safety during weight bearing activities 08/23/2014   Baseline No pain with walking,  Duration not assessed.   Time 4   Period Weeks   Status Achieved   PT SHORT TERM GOAL #5   Title pt will increase FOTO score by >8 points to assist with functional capacity 08/23/2014)   Time 4    Period Weeks   Status Achieved   PT SHORT TERM GOAL #6   Title pt will be able to verbalize and demonstrate proper transfer techniques from supine <>sit and bed mobility to help protect the lumbar spine and derease pain 08/23/2014   Time 4   Period Weeks   Status Achieved           PT Long Term Goals - 10/22/14 1243    PT LONG TERM GOAL #1   Title pt will be I with Advanced HEP 09/22/2014   Period Weeks   Status On-going   PT LONG TERM GOAL #2   Title pt will increase trunk mobility by > 20 degrees in all planes to assist wtih safety during driving and ADLS. 08/24/8754   Time 8   Period Weeks   Status On-going   PT LONG TERM GOAL #3   Title pt will increase bil LE strength to >4/5 strength to help with climbing >10 steps and safety using LRAD during weight bearing activities 09/22/2014   Time 8   Period Weeks   Status On-going   PT LONG TERM GOAL #4   Title pt will demonstrate <4/10 pain during standing / walking for >20 minutes to help with endurance and ADLs 09/22/2014   Time 8   Period Weeks   Status On-going   PT LONG TERM GOAL #5   Title pt will be able to verbalize and demonstrate technqiues to reduce risk of reinjury of the low back by postural awareness, lifting and carrying mechanics, and HEP 09/22/2014   Time 8   Period Weeks   Status On-going               Plan - 10/24/14 1239    Clinical Impression Statement [name] arrived to therapy today demonstrating a more efficient gait pattern utilizing the smaller/quieter gait pattern that was discussed last visit and reports feeling like she isn't as fatigued. Focused todays treatment on core strengthening/stabilization. She was able to complete all exercises without complaint but demonstrate increased diffiuclty maintaining good posture/stability during dynadisc LAQ and utlilzed posterior trunk lean to compenstate. Educated about Post therapy class with Santiago Glad so she can continue strengthening and progressing after discharge.     PT Next Visit Plan progress hip/knee strengthening, gait training, balance training, core strengthening, gait training, stair training?   Consulted and Agree with Plan of Care Patient  Problem List Patient Active Problem List   Diagnosis Date Noted  . Weakness 11/03/2012   Starr Lake PT, DPT, LAT, ATC  10/24/2014  12:45 PM    [place] [facility] [address] [place], [place], [address] Phone: [phone]   Fax:  [phone]

## 2014-10-30 ENCOUNTER — Ambulatory Visit: Payer: Medicare Other | Admitting: Physical Therapy

## 2014-10-30 DIAGNOSIS — R29898 Other symptoms and signs involving the musculoskeletal system: Secondary | ICD-10-CM

## 2014-10-30 DIAGNOSIS — M2560 Stiffness of unspecified joint, not elsewhere classified: Secondary | ICD-10-CM

## 2014-10-30 DIAGNOSIS — M545 Low back pain, unspecified: Secondary | ICD-10-CM

## 2014-10-30 DIAGNOSIS — M5386 Other specified dorsopathies, lumbar region: Secondary | ICD-10-CM

## 2014-10-30 NOTE — Therapy (Signed)
[place], [place], [address] Phone: [phone]   Fax:  [phone]  Physical Therapy Treatment  Patient Details  Name: [name] MRN: [id number] Date of Birth: 05/02/1945 Referring Provider:  Merrilee Seashore, MD  Encounter Date: 10/30/2014      PT End of Session - 10/30/14 1748    Visit Number 19   Number of Visits 23   Date for PT Re-Evaluation 11/07/14   Authorization Type Medicare: use KX modifier   PT Start Time 8546   PT Stop Time 1500   PT Time Calculation (min) 45 min   Activity Tolerance Patient tolerated treatment well   Behavior During Therapy [name] for tasks assessed/performed      Past Medical History  Diagnosis Date  . Arthritis     Past Surgical History  Procedure Laterality Date  . Neck surgery  2010    cerv disc fused   . Colonoscopy    . Bunionectomy  2013    rt foot  . Abdominal hysterectomy  1995  . Muscle biopsy Left 10/03/2012    Procedure: LEFT QUADRICEP MUSCLE BIOPSY;  Surgeon: Odis Hollingshead, MD;  Location: [place];  Service: General;  Laterality: Left;    There were no vitals filed for this visit.  Visit Diagnosis:  Weakness of both legs  Decreased ROM of lumbar spine  Joint stiffness of spine  Bilateral low back pain without sciatica      Subjective Assessment - 10/30/14 1423    Subjective "My knee is trying to act up on me, but I am able to walk better and have noticed that I have much more energy"   Currently in Pain? No/denies   Pain Score 0-No pain   Pain Location Back   Pain Orientation Left;Anterior   Pain Descriptors / Indicators Aching                         [facility] Adult PT Treatment/Exercise - 10/30/14 1425    Lumbar Exercises: Aerobic   Stationary Bike NuStep L7 x 8 min  goal 400 steps, got 374 steps   Lumbar Exercises: Standing   Other Standing Lumbar Exercises side stepping 5 x in //, standing  hip extension/abd 2 x 10 with 3#, step-ups on 4 in step in // bars, gait training with focus on exaggerated heel strike/ walking in // for safety without holding on for dynamic balance x 4, forward walking/ backward walking x 5 ea in //  4# ea.   Other Standing Lumbar Exercises walking/marching // x 4, navigaiting steps with SPC 4 x  on 4in side, 2 x on 6 inch side  3#   Lumbar Exercises: Seated   Long Arc Quad on Chair 2 sets;20 reps  20 on L, 10 on RLE   Sit to Stand 10 reps  from chair                  PT Short Term Goals - 10/10/14 1305    PT SHORT TERM GOAL #1   Title pt will be I with basic HEP 08/23/2014   Time 4   Period Weeks   Status Achieved   PT SHORT TERM GOAL #2   Title pt will increase overall trunk mobilty by 10 degrees in all planes to assist with ADLs 08/23/2014   Time 4   Period Weeks   Status Achieved   PT SHORT TERM GOAL #  3   Title pt will increase Bil LE strength to 3/5 to help promote safety during ambulatory activities. 08/23/2014   Time 4   Period Weeks   Status Achieved   PT SHORT TERM GOAL #4   Title pt will decrease pain to <6/10 during and following standing / walking for >10 minutes to assist with endurance and safety during weight bearing activities 08/23/2014   Baseline No pain with walking,  Duration not assessed.   Time 4   Period Weeks   Status Achieved   PT SHORT TERM GOAL #5   Title pt will increase FOTO score by >8 points to assist with functional capacity 08/23/2014)   Time 4   Period Weeks   Status Achieved   PT SHORT TERM GOAL #6   Title pt will be able to verbalize and demonstrate proper transfer techniques from supine <>sit and bed mobility to help protect the lumbar spine and derease pain 08/23/2014   Time 4   Period Weeks   Status Achieved           PT Long Term Goals - 10/22/14 1243    PT LONG TERM GOAL #1   Title pt will be I with Advanced HEP 09/22/2014   Period Weeks   Status On-going   PT LONG TERM GOAL #2   Title  pt will increase trunk mobility by > 20 degrees in all planes to assist wtih safety during driving and ADLS. 04/27/8525   Time 8   Period Weeks   Status On-going   PT LONG TERM GOAL #3   Title pt will increase bil LE strength to >4/5 strength to help with climbing >10 steps and safety using LRAD during weight bearing activities 09/22/2014   Time 8   Period Weeks   Status On-going   PT LONG TERM GOAL #4   Title pt will demonstrate <4/10 pain during standing / walking for >20 minutes to help with endurance and ADLs 09/22/2014   Time 8   Period Weeks   Status On-going   PT LONG TERM GOAL #5   Title pt will be able to verbalize and demonstrate technqiues to reduce risk of reinjury of the low back by postural awareness, lifting and carrying mechanics, and HEP 09/22/2014   Time 8   Period Weeks   Status On-going               Plan - 10/30/14 1748    Clinical Impression Statement [name] continues to make progress with walking with [facility] and reports that she has had more energy. she reports some intermittent burning in the L knee but states it went away after therapy. Focused todays treatment on prolong standing exercises to help promote functional endurance which pt completed all tasks without complaint today. she was able to navigate up steps today using bil hand rails for supports, with supervision for safety. Plan to progress with standing strengthening exercises.    PT Next Visit Plan progress hip/knee strengthening, gait training, balance training, core strengthening, gait training, stair training   Consulted and Agree with Plan of Care Patient        Problem List Patient Active Problem List   Diagnosis Date Noted  . Weakness 11/03/2012   Starr Lake PT, DPT, LAT, ATC  10/30/2014  5:53 PM    [place] [facility] [address] [place], [place], [address] Phone: [phone]   Fax:  [phone]

## 2014-11-01 ENCOUNTER — Ambulatory Visit: Payer: Medicare Other | Admitting: Physical Therapy

## 2014-11-01 DIAGNOSIS — M2560 Stiffness of unspecified joint, not elsewhere classified: Secondary | ICD-10-CM

## 2014-11-01 DIAGNOSIS — M5386 Other specified dorsopathies, lumbar region: Secondary | ICD-10-CM

## 2014-11-01 DIAGNOSIS — R29898 Other symptoms and signs involving the musculoskeletal system: Secondary | ICD-10-CM

## 2014-11-01 DIAGNOSIS — M545 Low back pain, unspecified: Secondary | ICD-10-CM

## 2014-11-01 NOTE — Therapy (Signed)
[place], [place], [address] Phone: [phone]   Fax:  [phone]  Physical Therapy Treatment  Patient Details  Name: [name] MRN: [id number] Date of Birth: Mar 18, 1946 Referring Provider:  Merrilee Seashore, MD  Encounter Date: 11/01/2014      PT End of Session - 11/01/14 1646    Visit Number 20   Number of Visits 23   Date for PT Re-Evaluation 11/07/14   Authorization Type Medicare: use KX modifier   PT Start Time 8101   PT Stop Time 1500   PT Time Calculation (min) 40 min   Activity Tolerance Patient tolerated treatment well   Behavior During Therapy [facility] for tasks assessed/performed      Past Medical History  Diagnosis Date  . Arthritis     Past Surgical History  Procedure Laterality Date  . Neck surgery  2010    cerv disc fused   . Colonoscopy    . Bunionectomy  2013    rt foot  . Abdominal hysterectomy  1995  . Muscle biopsy Left 10/03/2012    Procedure: LEFT QUADRICEP MUSCLE BIOPSY;  Surgeon: Odis Hollingshead, MD;  Location: [place];  Service: General;  Laterality: Left;    There were no vitals filed for this visit.  Visit Diagnosis:  Weakness of both legs  Decreased ROM of lumbar spine  Joint stiffness of spine  Bilateral low back pain without sciatica      Subjective Assessment - 11/01/14 1426    Subjective "the knee is just burning today, somedays its good and sometimes its bad. depends on what I do"   Currently in Pain? No/denies   Pain Score 0   Pain Location Back                         [facility] Adult PT Treatment/Exercise - 11/01/14 1430    Lumbar Exercises: Aerobic   Stationary Bike NuStep L7 x 10 min   Lumbar Exercises: Standing   Other Standing Lumbar Exercises forward step-ups 2 x 10  4 in step   Other Standing Lumbar Exercises marching in place 2 x 15, hip extension/abduction 2# 2 x 15  with HHA on counter    Lumbar Exercises: Seated   Long Arc Quad on Chair 10 reps;4 sets  2#   Sit to Stand 10 reps  2 x with table lowered between sets   Other Seated Lumbar Exercises seated marching 4 x 10   2#   Lumbar Exercises: Supine   Bridge 10 reps  with feet on physioball, VC to contract core   Bridge Limitations PT assisted with mild stabilization to prevent trunk rotation   Other Supine Lumbar Exercises Abdominal brace x 15   pushing physioball into knees with arms                PT Education - 11/01/14 1645    Education provided Yes   Education Details educated current level of function, may discharge next visit   Person(s) Educated Patient   Methods Explanation   Comprehension Verbalized understanding          PT Short Term Goals - 10/10/14 1305    PT SHORT TERM GOAL #1   Title pt will be I with basic HEP 08/23/2014   Time 4   Period Weeks   Status Achieved   PT SHORT TERM GOAL #2   Title pt will increase overall  trunk mobilty by 10 degrees in all planes to assist with ADLs 08/23/2014   Time 4   Period Weeks   Status Achieved   PT SHORT TERM GOAL #3   Title pt will increase Bil LE strength to 3/5 to help promote safety during ambulatory activities. 08/23/2014   Time 4   Period Weeks   Status Achieved   PT SHORT TERM GOAL #4   Title pt will decrease pain to <6/10 during and following standing / walking for >10 minutes to assist with endurance and safety during weight bearing activities 08/23/2014   Baseline No pain with walking,  Duration not assessed.   Time 4   Period Weeks   Status Achieved   PT SHORT TERM GOAL #5   Title pt will increase FOTO score by >8 points to assist with functional capacity 08/23/2014)   Time 4   Period Weeks   Status Achieved   PT SHORT TERM GOAL #6   Title pt will be able to verbalize and demonstrate proper transfer techniques from supine <>sit and bed mobility to help protect the lumbar spine and derease pain 08/23/2014   Time 4   Period Weeks    Status Achieved           PT Long Term Goals - 10/22/14 1243    PT LONG TERM GOAL #1   Title pt will be I with Advanced HEP 09/22/2014   Period Weeks   Status On-going   PT LONG TERM GOAL #2   Title pt will increase trunk mobility by > 20 degrees in all planes to assist wtih safety during driving and ADLS. 09/22/4285   Time 8   Period Weeks   Status On-going   PT LONG TERM GOAL #3   Title pt will increase bil LE strength to >4/5 strength to help with climbing >10 steps and safety using LRAD during weight bearing activities 09/22/2014   Time 8   Period Weeks   Status On-going   PT LONG TERM GOAL #4   Title pt will demonstrate <4/10 pain during standing / walking for >20 minutes to help with endurance and ADLs 09/22/2014   Time 8   Period Weeks   Status On-going   PT LONG TERM GOAL #5   Title pt will be able to verbalize and demonstrate technqiues to reduce risk of reinjury of the low back by postural awareness, lifting and carrying mechanics, and HEP 09/22/2014   Time 8   Period Weeks   Status On-going               Plan - 11/01/14 1647    Clinical Impression Statement [name] presents to therapy with report that she had some increased burning in her knee but says its possibly due to hurrying to therapy to make it on time. She was able to perform all exercises without complaint or pain except for fatigue while doing step ups on 4 inch step. Educated pt about how she has progressed well since evaluation and that due to her progress and ability to perform exercises independently may discharge next visit. She reports that she plans to attend the self pay class on Wednesdays with Santiago Glad after discharge.    PT Next Visit Plan FOTO, goals, possibly discharge, HEP review, remind about wednesday classes   Consulted and Agree with Plan of Care Patient        Problem List Patient Active Problem List   Diagnosis Date Noted  . Weakness 11/03/2012  Starr Lake PT, DPT, LAT, ATC   11/01/2014  4:58 PM    [facility] [address] [place], [place], [address] Phone: [phone]   Fax:  [phone]

## 2014-11-06 ENCOUNTER — Ambulatory Visit: Payer: Medicare Other | Admitting: Physical Therapy

## 2014-11-06 DIAGNOSIS — M2560 Stiffness of unspecified joint, not elsewhere classified: Secondary | ICD-10-CM | POA: Diagnosis not present

## 2014-11-06 DIAGNOSIS — M5386 Other specified dorsopathies, lumbar region: Secondary | ICD-10-CM

## 2014-11-06 DIAGNOSIS — R29898 Other symptoms and signs involving the musculoskeletal system: Secondary | ICD-10-CM

## 2014-11-06 DIAGNOSIS — M545 Low back pain: Secondary | ICD-10-CM | POA: Diagnosis not present

## 2014-11-06 NOTE — Therapy (Signed)
[place], [place], [address] Phone: [phone]   Fax:  [phone]  Physical Therapy Treatment  Patient Details  Name: [name] MRN: [id number] Date of Birth: 1945/06/02 Referring Provider:  Merrilee Seashore, MD  Encounter Date: 11/06/2014      PT End of Session - 11/06/14 1731    Visit Number 21   Number of Visits 23   Date for PT Re-Evaluation 11/07/14   PT Start Time 1110   PT Stop Time 1150   PT Time Calculation (min) 40 min   Equipment Utilized During Treatment Back brace   Activity Tolerance Patient tolerated treatment well;No increased pain   Behavior During Therapy [facility] for tasks assessed/performed      Past Medical History  Diagnosis Date  . Arthritis     Past Surgical History  Procedure Laterality Date  . Neck surgery  2010    cerv disc fused   . Colonoscopy    . Bunionectomy  2013    rt foot  . Abdominal hysterectomy  1995  . Muscle biopsy Left 10/03/2012    Procedure: LEFT QUADRICEP MUSCLE BIOPSY;  Surgeon: Odis Hollingshead, MD;  Location: [place];  Service: General;  Laterality: Left;    There were no vitals filed for this visit.  Visit Diagnosis:  Weakness of both legs  Decreased ROM of lumbar spine  Joint stiffness of spine      Subjective Assessment - 11/06/14 1115    Subjective Knee burning Lt .  Wears a sleeve to help during exercises.                         [place] Adult PT Treatment/Exercise - 11/06/14 1110    Lumbar Exercises: Supine   Other Supine Lumbar Exercises All home exercises either practiced or verbally reviewed today.  Patient is independent.   Lumbar Exercises: Sidelying   Clam 10 reps  each   Hip Abduction 10 reps  both   Hip Abduction Limitations 4+/5 LT, RT 4/5.   Other Sidelying Lumbar Exercises Adduction 2/5 both.  able to tolerate slight resistance within available range.,   Lumbar Exercises:  Prone   Straight Leg Raises Limitations MMT Extension Lt 4/5, Rt 4-/5 within the available range.   Knee/Hip Exercises: Standing   Stairs 10 steps 4 on stairs 6 in clinic. able to do.     Knee/Hip Exercises: Supine   Heel Slides Right;1 set;10 reps   Bridges 10 reps   Straight Leg Raises Limitations MMT Rt 2/5, LT 4/5                  PT Short Term Goals - 10/10/14 1305    PT SHORT TERM GOAL #1   Title pt will be I with basic HEP 08/23/2014   Time 4   Period Weeks   Status Achieved   PT SHORT TERM GOAL #2   Title pt will increase overall trunk mobilty by 10 degrees in all planes to assist with ADLs 08/23/2014   Time 4   Period Weeks   Status Achieved   PT SHORT TERM GOAL #3   Title pt will increase Bil LE strength to 3/5 to help promote safety during ambulatory activities. 08/23/2014   Time 4   Period Weeks   Status Achieved   PT SHORT TERM GOAL #4   Title pt will decrease pain to <6/10 during and following standing /  walking for >10 minutes to assist with endurance and safety during weight bearing activities 08/23/2014   Baseline No pain with walking,  Duration not assessed.   Time 4   Period Weeks   Status Achieved   PT SHORT TERM GOAL #5   Title pt will increase FOTO score by >8 points to assist with functional capacity 08/23/2014)   Time 4   Period Weeks   Status Achieved   PT SHORT TERM GOAL #6   Title pt will be able to verbalize and demonstrate proper transfer techniques from supine <>sit and bed mobility to help protect the lumbar spine and derease pain 08/23/2014   Time 4   Period Weeks   Status Achieved           PT Long Term Goals - 11/06/14 1735    PT LONG TERM GOAL #1   Title pt will be I with Advanced HEP 09/22/2014   Baseline independent   Time 8   Period Weeks   Status Achieved   PT LONG TERM GOAL #2   Title pt will increase trunk mobility by > 20 degrees in all planes to assist wtih safety during driving and ADLS. 06/26/6597   Baseline 30 trunk  side bends both, flexion 50,  extension 34   Time 8   Period Weeks   Status Partially Met   PT LONG TERM GOAL #3   Title pt will increase bil LE strength to >4/5 strength to help with climbing >10 steps and safety using LRAD during weight bearing activities 09/22/2014   Baseline All met except for Adductors 2/10 today (Fatigued?) and 4-/5 Rt hip extension   Time 8   Period Weeks   Status Partially Met   PT LONG TERM GOAL #4   Title pt will demonstrate <4/10 pain during standing / walking for >20 minutes to help with endurance and ADLs 09/22/2014   Baseline Probably not yet consistantly doing at home but can do in clinic.   Time 8   Period Weeks   Status Partially Met   PT LONG TERM GOAL #5   Title pt will be able to verbalize and demonstrate technqiues to reduce risk of reinjury of the low back by postural awareness, lifting and carrying mechanics, and HEP 09/22/2014   Baseline Patient does not lift or carry anything so she does not fall.   Time 8   Period Weeks   Status Deferred               Plan - 11/06/14 1732    Clinical Impression Statement Last visit today.  Patient was surprised and didn't comprehend that this really was the last one due to having future appointments.  She agreed to Discharge and plans to begin groug sessions starting next week.  Goals wer met or partially met.  Hip adductors were weak today.   PT Next Visit Plan Discharge   PT Home Exercise Plan continue home exercises daily.    Consulted and Agree with Plan of Care Patient        Problem List Patient Active Problem List   Diagnosis Date Noted  . Weakness 11/03/2012    [name],[name] 11/06/2014, 5:40 PM  [facility] [address] [place], [place], [address] Phone: [phone]   Fax:  [phone]     Melvenia Needles, PTA 11/06/2014 5:40 PM Phone: [phone] Fax: [phone]

## 2014-11-06 NOTE — Therapy (Addendum)
[place], [place], [address] Phone: [phone]   Fax:  [phone]  Physical Therapy Treatment  Patient Details  Name: [name] MRN: [id number] Date of Birth: 1945/06/02 Referring Provider:  Merrilee Seashore, MD  Encounter Date: 11/06/2014      PT End of Session - 11/06/14 1731    Visit Number 21   Number of Visits 23   Date for PT Re-Evaluation 11/07/14   PT Start Time 1110   PT Stop Time 1150   PT Time Calculation (min) 40 min   Equipment Utilized During Treatment Back brace   Activity Tolerance Patient tolerated treatment well;No increased pain   Behavior During Therapy [facility] for tasks assessed/performed      Past Medical History  Diagnosis Date  . Arthritis     Past Surgical History  Procedure Laterality Date  . Neck surgery  2010    cerv disc fused   . Colonoscopy    . Bunionectomy  2013    rt foot  . Abdominal hysterectomy  1995  . Muscle biopsy Left 10/03/2012    Procedure: LEFT QUADRICEP MUSCLE BIOPSY;  Surgeon: Odis Hollingshead, MD;  Location: [facility];  Service: General;  Laterality: Left;    There were no vitals filed for this visit.  Visit Diagnosis:  Weakness of both legs  Decreased ROM of lumbar spine  Joint stiffness of spine      Subjective Assessment - 11/06/14 1115    Subjective Knee burning Lt .  Wears a sleeve to help during exercises.                         [facility] Adult PT Treatment/Exercise - 11/06/14 1110    Lumbar Exercises: Supine   Other Supine Lumbar Exercises All home exercises either practiced or verbally reviewed today.  Patient is independent.   Lumbar Exercises: Sidelying   Clam 10 reps  each   Hip Abduction 10 reps  both   Hip Abduction Limitations 4+/5 LT, RT 4/5.   Other Sidelying Lumbar Exercises Adduction 2/5 both.  able to tolerate slight resistance within available range.,   Lumbar Exercises:  Prone   Straight Leg Raises Limitations MMT Extension Lt 4/5, Rt 4-/5 within the available range.   Knee/Hip Exercises: Standing   Stairs 10 steps 4 on stairs 6 in clinic. able to do.     Knee/Hip Exercises: Supine   Heel Slides Right;1 set;10 reps   Bridges 10 reps   Straight Leg Raises Limitations MMT Rt 2/5, LT 4/5                  PT Short Term Goals - 10/10/14 1305    PT SHORT TERM GOAL #1   Title pt will be I with basic HEP 08/23/2014   Time 4   Period Weeks   Status Achieved   PT SHORT TERM GOAL #2   Title pt will increase overall trunk mobilty by 10 degrees in all planes to assist with ADLs 08/23/2014   Time 4   Period Weeks   Status Achieved   PT SHORT TERM GOAL #3   Title pt will increase Bil LE strength to 3/5 to help promote safety during ambulatory activities. 08/23/2014   Time 4   Period Weeks   Status Achieved   PT SHORT TERM GOAL #4   Title pt will decrease pain to <6/10 during and following standing /  walking for >10 minutes to assist with endurance and safety during weight bearing activities 08/23/2014   Baseline No pain with walking,  Duration not assessed.   Time 4   Period Weeks   Status Achieved   PT SHORT TERM GOAL #5   Title pt will increase FOTO score by >8 points to assist with functional capacity 08/23/2014)   Time 4   Period Weeks   Status Achieved   PT SHORT TERM GOAL #6   Title pt will be able to verbalize and demonstrate proper transfer techniques from supine <>sit and bed mobility to help protect the lumbar spine and derease pain 08/23/2014   Time 4   Period Weeks   Status Achieved           PT Long Term Goals - 11/06/14 1735    PT LONG TERM GOAL #1   Title pt will be I with Advanced HEP 09/22/2014   Baseline independent   Time 8   Period Weeks   Status Achieved   PT LONG TERM GOAL #2   Title pt will increase trunk mobility by > 20 degrees in all planes to assist wtih safety during driving and ADLS. 0/0/3704   Baseline 30 trunk  side bends both, flexion 50,  extension 34   Time 8   Period Weeks   Status Partially Met   PT LONG TERM GOAL #3   Title pt will increase bil LE strength to >4/5 strength to help with climbing >10 steps and safety using LRAD during weight bearing activities 09/22/2014   Baseline All met except for Adductors 2/10 today (Fatigued?) and 4-/5 Rt hip extension   Time 8   Period Weeks   Status Partially Met   PT LONG TERM GOAL #4   Title pt will demonstrate <4/10 pain during standing / walking for >20 minutes to help with endurance and ADLs 09/22/2014   Baseline Probably not yet consistantly doing at home but can do in clinic.   Time 8   Period Weeks   Status Partially Met   PT LONG TERM GOAL #5   Title pt will be able to verbalize and demonstrate technqiues to reduce risk of reinjury of the low back by postural awareness, lifting and carrying mechanics, and HEP 09/22/2014   Baseline Patient does not lift or carry anything so she does not fall.   Time 8   Period Weeks   Status Deferred               Plan - 11/06/14 1732    Clinical Impression Statement Last visit today.  Patient was surprised and didn't comprehend that this really was the last one due to having future appointments.  She agreed to Discharge and plans to begin groug sessions starting next week.  Goals wer met or partially met.  Hip adductors were weak today.   PT Next Visit Plan Discharge   PT Home Exercise Plan continue home exercises daily.    Consulted and Agree with Plan of Care Patient        Problem List Patient Active Problem List   Diagnosis Date Noted  . Weakness 11/03/2012    Sandara Tyree 11/06/2014, 5:43 PM  [facility] [address] [place], [place], [address] Phone: [phone]   Fax:  [phone]                      .PHYSICAL THERAPY DISCHARGE SUMMARY  Visits from Start of Care:  21  Current functional level related to  goals / functional outcomes: FOTO 48% limited   Remaining deficits: Limited trunk mobility, ambulation with SPC, strength defecit in RLE compared bil   Education / Equipment: HEP, theraband for exercises, exercise class handout at clinic  Plan: Patient agrees to discharge.  Patient goals were not met. Patient is being discharged due to being pleased with the current functional level.  ?????       Kristoffer Leamon PT, DPT, LAT, ATC  11/07/2014  8:03 AM

## 2014-11-08 ENCOUNTER — Ambulatory Visit: Payer: Medicare Other | Admitting: Physical Therapy

## 2014-11-08 DIAGNOSIS — M25562 Pain in left knee: Secondary | ICD-10-CM | POA: Diagnosis not present

## 2014-11-08 DIAGNOSIS — M25862 Other specified joint disorders, left knee: Secondary | ICD-10-CM | POA: Diagnosis not present

## 2014-11-08 DIAGNOSIS — G5792 Unspecified mononeuropathy of left lower limb: Secondary | ICD-10-CM | POA: Diagnosis not present

## 2014-11-13 ENCOUNTER — Encounter: Payer: PRIVATE HEALTH INSURANCE | Admitting: Physical Therapy

## 2014-11-15 ENCOUNTER — Encounter: Payer: PRIVATE HEALTH INSURANCE | Admitting: Physical Therapy

## 2014-11-15 DIAGNOSIS — M4326 Fusion of spine, lumbar region: Secondary | ICD-10-CM | POA: Diagnosis not present

## 2014-11-16 ENCOUNTER — Encounter: Payer: PRIVATE HEALTH INSURANCE | Admitting: Physical Therapy

## 2014-11-20 ENCOUNTER — Encounter: Payer: PRIVATE HEALTH INSURANCE | Admitting: Physical Therapy

## 2014-11-22 ENCOUNTER — Encounter: Payer: PRIVATE HEALTH INSURANCE | Admitting: Physical Therapy

## 2014-11-27 ENCOUNTER — Encounter: Payer: PRIVATE HEALTH INSURANCE | Admitting: Physical Therapy

## 2014-11-28 DIAGNOSIS — R5382 Chronic fatigue, unspecified: Secondary | ICD-10-CM | POA: Diagnosis not present

## 2014-11-28 DIAGNOSIS — N3941 Urge incontinence: Secondary | ICD-10-CM | POA: Diagnosis not present

## 2014-11-28 DIAGNOSIS — M6281 Muscle weakness (generalized): Secondary | ICD-10-CM | POA: Diagnosis not present

## 2014-11-28 DIAGNOSIS — M5136 Other intervertebral disc degeneration, lumbar region: Secondary | ICD-10-CM | POA: Diagnosis not present

## 2014-11-29 ENCOUNTER — Encounter: Payer: PRIVATE HEALTH INSURANCE | Admitting: Physical Therapy

## 2014-12-04 DIAGNOSIS — R531 Weakness: Secondary | ICD-10-CM | POA: Diagnosis not present

## 2014-12-04 DIAGNOSIS — M4806 Spinal stenosis, lumbar region: Secondary | ICD-10-CM | POA: Diagnosis not present

## 2014-12-04 DIAGNOSIS — R269 Unspecified abnormalities of gait and mobility: Secondary | ICD-10-CM | POA: Diagnosis not present

## 2014-12-04 DIAGNOSIS — M21371 Foot drop, right foot: Secondary | ICD-10-CM | POA: Diagnosis not present

## 2014-12-04 DIAGNOSIS — M4326 Fusion of spine, lumbar region: Secondary | ICD-10-CM | POA: Diagnosis not present

## 2014-12-06 ENCOUNTER — Ambulatory Visit: Payer: Medicare Other | Attending: Neurosurgery | Admitting: Physical Therapy

## 2014-12-06 DIAGNOSIS — M2560 Stiffness of unspecified joint, not elsewhere classified: Secondary | ICD-10-CM

## 2014-12-06 DIAGNOSIS — R269 Unspecified abnormalities of gait and mobility: Secondary | ICD-10-CM | POA: Diagnosis not present

## 2014-12-06 DIAGNOSIS — R2681 Unsteadiness on feet: Secondary | ICD-10-CM

## 2014-12-06 DIAGNOSIS — R29898 Other symptoms and signs involving the musculoskeletal system: Secondary | ICD-10-CM | POA: Diagnosis not present

## 2014-12-06 NOTE — Patient Instructions (Signed)
   Billee Balcerzak PT, DPT, LAT, ATC  [facility] Phone: [phone]     

## 2014-12-06 NOTE — Therapy (Signed)
[place], [place], [address] Phone: [phone]   Fax:  [phone]  Physical Therapy Evaluation  Patient Details  Name: [name] MRN: [id number] Date of Birth: Mar 02, 1946 Referring Provider:  Maryellen Pile, MD  Encounter Date: 12/06/2014      PT End of Session - 12/06/14 1120    Visit Number 1   Number of Visits 12   Date for PT Re-Evaluation 01/17/15   Authorization Type Medicare: use KX modifier   PT Start Time 0845   PT Stop Time 0930   PT Time Calculation (min) 45 min   Activity Tolerance Patient tolerated treatment well   Behavior During Therapy [facility] for tasks assessed/performed      Past Medical History  Diagnosis Date  . Arthritis     Past Surgical History  Procedure Laterality Date  . Neck surgery  2010    cerv disc fused   . Colonoscopy    . Bunionectomy  2013    rt foot  . Abdominal hysterectomy  1995  . Muscle biopsy Left 10/03/2012    Procedure: LEFT QUADRICEP MUSCLE BIOPSY;  Surgeon: Odis Hollingshead, MD;  Location: [facility];  Service: General;  Laterality: Left;    There were no vitals filed for this visit.  Visit Diagnosis:  Weakness of both legs - Plan: PT plan of care cert/re-cert  Joint stiffness of spine - Plan: PT plan of care cert/re-cert  Abnormality of gait - Plan: PT plan of care cert/re-cert  Unsteadiness - Plan: PT plan of care cert/re-cert      Subjective Assessment - 12/06/14 0858    Subjective pt is a 69 y.o F with s/p lumbar fusion 04/27/2014 and reports that the back is getting better. pt reports that her biggest complaint is feeling weak in both her legs. She reports rare tightness in the low back but is fleeting.    Limitations Sitting;Lifting;Standing;Walking;House hold activities   How long can you sit comfortably? unlimited    How long can you stand comfortably? 5 min   How long can you walk comfortably? depends on average  15 min   Diagnostic tests no recent x-ray on the low back   Patient Stated Goals to get stronger in the legs   Currently in Pain? No/denies   Pain Score 0-No pain   Pain Location Back   Pain Orientation Lower;Left   Pain Score 0   Pain Location Leg   Pain Orientation Right;Left   Pain Descriptors / Indicators --  weakness, mild stinging in the L lateral shin   Pain Type --  chronic weakness   Pain Onset More than a month ago   Pain Frequency Constant   Aggravating Factors  prolonged standing, walking, stairs   Pain Relieving Factors sitting and resting            [facility] PT Assessment - 12/06/14 0904    Assessment   Medical Diagnosis s/p lumbar fusion and bil le weakness   Onset Date/Surgical Date 04/27/14   Hand Dominance Right   Next MD Visit --  November 2016   Prior Therapy yes  low back    Precautions   Precautions Back   Restrictions   Weight Bearing Restrictions No   Balance Screen   Has the patient fallen in the past 6 months No   Has the patient had a decrease in activity level because of a fear of falling?  No   Is  the patient reluctant to leave their home because of a fear of falling?  No   Home Ecologist residence   Living Arrangements Spouse/significant other   Available Help at Discharge Available 24 hours/day   Type of [place] to enter   Entrance Stairs-Number of Steps 1   Entrance Stairs-Rails None   Home Layout One level   [place] - 2 wheels;Cane - single point   Prior Function   Level of Independence Independent with basic ADLs;Independent with homemaking with ambulation;Independent with gait;Independent with transfers   Vocation Retired   Visual merchandiser.    Cognition   Overall Cognitive Status Within Functional Limits for tasks assessed   Tone   Assessment Location Right Lower Extremity   ROM / Strength   AROM / PROM / Strength AROM;Strength   AROM   Lumbar  Flexion 58   Lumbar Extension 12  tightness/ pain while extending   Lumbar - Right Side Bend 35   Lumbar - Left Side Bend 25   Strength   Strength Assessment Site Knee;Hip;Ankle   Right Hip Flexion 3-/5   Right Hip Extension 3+/5   Right Hip ABduction 3+/5   Right Hip ADduction 4-/5   Left Hip Flexion 4/5   Left Hip Extension 3+/5   Left Hip ABduction 4/5   Left Hip ADduction 4-/5   Right Knee Flexion 4-/5   Right Knee Extension 4+/5   Left Knee Flexion 4/5   Left Knee Extension 4+/5   Right/Left Ankle Right;Left   Right Ankle Dorsiflexion 4-/5   Right Ankle Plantar Flexion 4+/5   Right Ankle Inversion 3+/5   Right Ankle Eversion 3+/5   Left Ankle Dorsiflexion 4+/5   Left Ankle Plantar Flexion 4+/5   Left Ankle Inversion 4+/5   Left Ankle Eversion 4+/5   Palpation   Palpation comment tingling noted in the L lateral shin but no  specfici point of tendnerness or reproduction   Ambulation/Gait   Gait Pattern Antalgic;Left genu recurvatum;Right genu recurvatum;Decreased hip/knee flexion - right;Decreased hip/knee flexion - left;Decreased step length - right;Decreased step length - left;Step-to pattern   RLE Tone   RLE Tone Hypertonic   RLE Tone   Hypertonic Details 3 beat clonus upon plantarflexion quick stretch                           PT Education - 12/06/14 1119    Education provided Yes   Education Details evaluation findings, POC, Goals, HEP   Person(s) Educated Patient   Methods Explanation   Comprehension Verbalized understanding          PT Short Term Goals - 12/06/14 1126    Additional Short Term Goals   Additional Short Term Goals Yes   PT SHORT TERM GOAL #7   Title PT will be I with inital HEP (12/27/2014)   Time 3   Period Weeks   Status New   PT SHORT TERM GOAL #8   Title pt will increase her FOTO score by >5 points to demostrate improving function (12/27/2014)   Time 3   Period Weeks   Status New   PT SHORT TERM GOAL #9    TITLE pt will be able to stand for > 10 minutes to demonstrate improving functional endurance (12/27/2014)   Time 3   Period Weeks   Status New  PT Long Term Goals - 12/06/14 1125    PT LONG TERM GOAL #1   Title pt will be I with Advanced HEP 09/22/2014   Baseline FROM PREVIOUSLY DISCHARGED EPISODE   Time 8   Period Weeks   Status Achieved   PT LONG TERM GOAL #2   Title pt will increase trunk mobility by > 20 degrees in all planes to assist wtih safety during driving and ADLS. 03/24/4578   Baseline FROM PREVIOUSLY DISCHARGED EPISODE   Time 8   Period Weeks   Status Partially Met   PT LONG TERM GOAL #3   Title pt will increase bil LE strength to >4/5 strength to help with climbing >10 steps and safety using LRAD during weight bearing activities 09/22/2014   Baseline FROM PREVIOUSLY DISCHARGED EPISODE   Time 8   Period Weeks   Status Partially Met   PT LONG TERM GOAL #4   Title pt will demonstrate <4/10 pain during standing / walking for >20 minutes to help with endurance and ADLs 09/22/2014   Baseline FROM PREVIOUSLY DISCHARGED EPISODE   Time 8   Period Weeks   Status Partially Met   PT LONG TERM GOAL #5   Title pt will be able to verbalize and demonstrate technqiues to reduce risk of reinjury of the low back by postural awareness, lifting and carrying mechanics, and HEP 09/22/2014   Baseline FROM PREVIOUSLY DISCHARGED EPISODE   Time 8   Period Weeks   Status Deferred   Additional Long Term Goals   Additional Long Term Goals Yes   PT LONG TERM GOAL #6   Title pt will be I with all HEP given throughout therapy (01/17/2015)   Time 6   Period Weeks   Status New   PT LONG TERM GOAL #7   Title pt will demonstrate bil LE strength to > 4/5 to help promote increased safety with walking and standing activities (01/17/2015)   Time 6   Period Weeks   Status New   PT LONG TERM GOAL #8   Title pt will be able to walk > 20 minutes with having to rest < 3 times to help with  improved functional endurance and community ambulation. (01/17/2015)   Time 6   Period Weeks   Status New   PT LONG TERM GOAL  #9   TITLE pt will increase her FOTO score to > 54 to demonstrate improved function at discharged (01/17/2015)   Time 6   Period Weeks   Status New               Plan - 12/06/14 1120    Clinical Impression Statement [name] returns to OPPT with CC or bil LE weakness. pt had lumbar spinal fusion on 04/27/2014. Pt was previously seen for her low back which she reports is doing fine, and demonstrates functional trunk mobility. She exhibits mild weakness in bil LE with increased weakness in the R hip flexors. She continues to demonstrate an antalgic gait pattern while using  a SPC. pt reported she no longer uses her AFO. Pt would benefit from physical therapy to increase her strength and improve her function by addressing the impairments listed.    Pt will benefit from skilled therapeutic intervention in order to improve on the following deficits Abnormal gait;Impaired flexibility;Improper body mechanics;Postural dysfunction;Difficulty walking;Decreased safety awareness;Decreased balance;Increased muscle spasms;Decreased strength;Decreased endurance;Decreased activity tolerance;Decreased mobility   PT Frequency 2x / week   PT Duration 6 weeks   PT Treatment/Interventions  ADLs/Self Care Home Management;Electrical Stimulation;Gait training;Therapeutic exercise;Patient/family education;Balance training;Stair training;Moist Heat;Manual techniques;Neuromuscular re-education;Functional mobility training;Cryotherapy;Ultrasound;DME Instruction;Therapeutic activities;Dry needling;Passive range of motion   PT Next Visit Plan assess response to HEP, CKC strengthening, work on endurance activities    PT Home Exercise Plan standing hip extension/abduction, bil heel raises, standing marching   Consulted and Agree with Plan of Care Patient;Family member/caregiver   Family Member  Consulted husband          G-Codes - Dec 11, 2014 1133    Functional Assessment Tool Used FOTO 59% limitation   Functional Limitation Mobility: Walking and moving around   Mobility: Walking and Moving Around Current Status [phone]) At least 40 percent but less than 60 percent impaired, limited or restricted   Mobility: Walking and Moving Around Goal Status [phone]) At least 20 percent but less than 40 percent impaired, limited or restricted       Problem List Patient Active Problem List   Diagnosis Date Noted  . Weakness 11/03/2012   Starr Lake PT, DPT, LAT, ATC  11-Dec-2014  11:38 AM      [facility] [address] [place], [place], [address] Phone: [phone]   Fax:  [phone]

## 2014-12-25 ENCOUNTER — Ambulatory Visit: Payer: Medicare Other | Attending: Neurosurgery | Admitting: Physical Therapy

## 2014-12-25 DIAGNOSIS — M545 Low back pain, unspecified: Secondary | ICD-10-CM

## 2014-12-25 DIAGNOSIS — R2681 Unsteadiness on feet: Secondary | ICD-10-CM | POA: Diagnosis not present

## 2014-12-25 DIAGNOSIS — R269 Unspecified abnormalities of gait and mobility: Secondary | ICD-10-CM

## 2014-12-25 DIAGNOSIS — M5386 Other specified dorsopathies, lumbar region: Secondary | ICD-10-CM

## 2014-12-25 DIAGNOSIS — R29898 Other symptoms and signs involving the musculoskeletal system: Secondary | ICD-10-CM | POA: Diagnosis not present

## 2014-12-25 DIAGNOSIS — M2560 Stiffness of unspecified joint, not elsewhere classified: Secondary | ICD-10-CM | POA: Insufficient documentation

## 2014-12-25 NOTE — Therapy (Signed)
[facility] [address] [place], [place], [address] Phone: [phone]   Fax:  [phone]  Physical Therapy Treatment  Patient Details  Name: [name] MRN: [id number] Date of Birth: 1946-01-21 Referring Provider:  Georgianne Fick, MD  Encounter Date: 12/25/2014      PT End of Session - 12/25/14 1236    Visit Number 2   Number of Visits 12   Date for PT Re-Evaluation 01/17/15   Authorization Type Medicare: use KX modifier   PT Start Time 1145   PT Stop Time 1230   PT Time Calculation (min) 45 min   Activity Tolerance Patient tolerated treatment well   Behavior During Therapy [facility] for tasks assessed/performed      Past Medical History  Diagnosis Date  . Arthritis     Past Surgical History  Procedure Laterality Date  . Neck surgery  2010    cerv disc fused   . Colonoscopy    . Bunionectomy  2013    rt foot  . Abdominal hysterectomy  1995  . Muscle biopsy Left 10/03/2012    Procedure: LEFT QUADRICEP MUSCLE BIOPSY;  Surgeon: Adolph Pollack, MD;  Location: [facility];  Service: General;  Laterality: Left;    There were no vitals filed for this visit.  Visit Diagnosis:  Weakness of both legs  Joint stiffness of spine  Abnormality of gait  Unsteadiness  Decreased ROM of lumbar spine  Bilateral low back pain without sciatica      Subjective Assessment - 12/25/14 1150    Subjective "I am feeling tired/ weak today, I have been keeping up with the exercises and have been feeling just tired"   Currently in Pain? No/denies   Pain Score 0-No pain   Pain Location Back   Pain Orientation Lower;Left   Pain Descriptors / Indicators Aching                         [facility] Adult PT Treatment/Exercise - 12/25/14 1151    Lumbar Exercises: Stretches   Passive Hamstring Stretch 2 reps;30 seconds   Single Knee to Chest Stretch 2 reps;30 seconds   Lumbar Exercises: Aerobic   Stationary Bike NuStep L7 x 5 min, L8 x 5 min   Lumbar Exercises: Machines for Strengthening   Cybex Knee Extension 1 plate 2 x 10    Cybex Knee Flexion 2 plates 2 x 10   Leg Press --   Lumbar Exercises: Standing   Heel Raises 20 reps   Lumbar Exercises: Seated   Sit to Stand 10 reps  VC for nose over toes from chair   Lumbar Exercises: Supine   Bridge 10 reps   Knee/Hip Exercises: Standing   Other Standing Knee Exercises standing hip extension/abduction 2 x 10 with 5#   Other Standing Knee Exercises standing marching 2 x 10  with 5#                PT Education - 12/25/14 1236    Education provided Yes   Education Details HEP Review   Person(s) Educated Patient   Methods Explanation   Comprehension Verbalized understanding          PT Short Term Goals - 12/06/14 1126    Additional Short Term Goals   Additional Short Term Goals Yes   PT SHORT TERM GOAL #7   Title PT will be I with inital HEP (12/27/2014)   Time 3  Period Weeks   Status New   PT SHORT TERM GOAL #8   Title pt will increase her FOTO score by >5 points to demostrate improving function (12/27/2014)   Time 3   Period Weeks   Status New   PT SHORT TERM GOAL #9   TITLE pt will be able to stand for > 10 minutes to demonstrate improving functional endurance (12/27/2014)   Time 3   Period Weeks   Status New           PT Long Term Goals - 12/06/14 1125    PT LONG TERM GOAL #1   Title pt will be I with Advanced HEP 09/22/2014   Baseline FROM PREVIOUSLY DISCHARGED EPISODE   Time 8   Period Weeks   Status Achieved   PT LONG TERM GOAL #2   Title pt will increase trunk mobility by > 20 degrees in all planes to assist wtih safety during driving and ADLS. 06/21/2876   Baseline FROM PREVIOUSLY DISCHARGED EPISODE   Time 8   Period Weeks   Status Partially Met   PT LONG TERM GOAL #3   Title pt will increase bil LE strength to >4/5 strength to help with climbing >10 steps and safety using LRAD during  weight bearing activities 09/22/2014   Baseline FROM PREVIOUSLY DISCHARGED EPISODE   Time 8   Period Weeks   Status Partially Met   PT LONG TERM GOAL #4   Title pt will demonstrate <4/10 pain during standing / walking for >20 minutes to help with endurance and ADLs 09/22/2014   Baseline FROM PREVIOUSLY DISCHARGED EPISODE   Time 8   Period Weeks   Status Partially Met   PT LONG TERM GOAL #5   Title pt will be able to verbalize and demonstrate technqiues to reduce risk of reinjury of the low back by postural awareness, lifting and carrying mechanics, and HEP 09/22/2014   Baseline FROM PREVIOUSLY DISCHARGED EPISODE   Time 8   Period Weeks   Status Deferred   Additional Long Term Goals   Additional Long Term Goals Yes   PT LONG TERM GOAL #6   Title pt will be I with all HEP given throughout therapy (01/17/2015)   Time 6   Period Weeks   Status New   PT LONG TERM GOAL #7   Title pt will demonstrate bil LE strength to > 4/5 to help promote increased safety with walking and standing activities (01/17/2015)   Time 6   Period Weeks   Status New   PT LONG TERM GOAL #8   Title pt will be able to walk > 20 minutes with having to rest < 3 times to help with improved functional endurance and community ambulation. (01/17/2015)   Time 6   Period Weeks   Status New   PT LONG TERM GOAL  #9   TITLE pt will increase her FOTO score to > 54 to demonstrate improved function at discharged (01/17/2015)   Time 6   Period Weeks   Status New               Plan - 12/25/14 1236    Clinical Impression Statement Mrs. Papandrea reports that she has been consistent with her HEP but states that it has made her tired. She was able to complete all exercises today but required VC that she can do exercises especially for nose over toes with sit to stand and was able to do 10 without UE. She reported  feeling tired after todays session but felt good.    PT Next Visit Plan CKC strengthening, work on endurance  activities, balance training   PT Home Exercise Plan HEP review   Consulted and Agree with Plan of Care Patient        Problem List Patient Active Problem List   Diagnosis Date Noted  . Weakness 11/03/2012   Starr Lake PT, DPT, LAT, ATC  12/25/2014  12:42 PM      [place] [facility] [address] [place], [place], [address] Phone: [phone]   Fax:  [phone]

## 2014-12-27 ENCOUNTER — Ambulatory Visit: Payer: Medicare Other | Admitting: Physical Therapy

## 2014-12-27 DIAGNOSIS — R2681 Unsteadiness on feet: Secondary | ICD-10-CM | POA: Diagnosis not present

## 2014-12-27 DIAGNOSIS — M2560 Stiffness of unspecified joint, not elsewhere classified: Secondary | ICD-10-CM | POA: Diagnosis not present

## 2014-12-27 DIAGNOSIS — R29898 Other symptoms and signs involving the musculoskeletal system: Secondary | ICD-10-CM | POA: Diagnosis not present

## 2014-12-27 DIAGNOSIS — M545 Low back pain: Secondary | ICD-10-CM | POA: Diagnosis not present

## 2014-12-27 DIAGNOSIS — R269 Unspecified abnormalities of gait and mobility: Secondary | ICD-10-CM

## 2014-12-27 NOTE — Therapy (Signed)
[place], [place], [address] Phone: [phone]   Fax:  [phone]  Physical Therapy Treatment  Patient Details  Name: [name] MRN: [id number] Date of Birth: Aug 09, 1945 Referring Provider:  Merrilee Seashore, MD  Encounter Date: 12/27/2014      PT End of Session - 12/27/14 1301    Visit Number 3   Number of Visits 12   Date for PT Re-Evaluation 01/17/15   PT Start Time 1150   PT Stop Time 1230   PT Time Calculation (min) 40 min   Activity Tolerance Patient tolerated treatment well   Behavior During Therapy [facility] for tasks assessed/performed      Past Medical History  Diagnosis Date  . Arthritis     Past Surgical History  Procedure Laterality Date  . Neck surgery  2010    cerv disc fused   . Colonoscopy    . Bunionectomy  2013    rt foot  . Abdominal hysterectomy  1995  . Muscle biopsy Left 10/03/2012    Procedure: LEFT QUADRICEP MUSCLE BIOPSY;  Surgeon: Odis Hollingshead, MD;  Location: [place];  Service: General;  Laterality: Left;    There were no vitals filed for this visit.  Visit Diagnosis:  Weakness of both legs  Abnormality of gait  Unsteadiness      Subjective Assessment - 12/27/14 1157    Subjective LT knee burns.     Currently in Pain? Yes   Pain Score 4    Pain Location Knee   Pain Orientation Left   Pain Descriptors / Indicators Burning   Pain Radiating Towards LT anterion   Pain Frequency Intermittent   Aggravating Factors  NOT sure some days does not have,  Does not burn every day.     Pain Relieving Factors Not sure   Multiple Pain Sites --  Back not bothering her                         [facility] Adult PT Treatment/Exercise - 12/27/14 1150    High Level Balance   High Level Balance Comments ststic and dynamic balance on level. balance foam and BOSO ball.  gait belt uses   Lumbar Exercises: Aerobic   Stationary Bike Nu  step L8  X 10 minutes   Lumbar Exercises: Machines for Strengthening   Leg Press 3 plates 30+, both legs   Lumbar Exercises: Standing   Functional Squats Limitations on BOSU with 2 hands   Lumbar Exercises: Seated   Sit to Stand --  less effort noticed.    Knee/Hip Exercises: Standing   Forward Step Up Limitations on bosu 10 X each, 2 hands, SBA for safety.    Other Standing Knee Exercises standing knee control blue band for flexion 10 X each leg                  PT Short Term Goals - 12/27/14 1304    PT SHORT TERM GOAL #7   Title PT will be I with inital HEP (12/27/2014)   Time 3   Period Weeks   Status On-going   PT SHORT TERM GOAL #8   Title pt will increase her FOTO score by >5 points to demostrate improving function (12/27/2014)   Time 3   Period Weeks   Status On-going   PT SHORT TERM GOAL #9   TITLE pt will be able to stand for >  10 minutes to demonstrate improving functional endurance (12/27/2014)   Time 3   Period Weeks   Status On-going           PT Long Term Goals - 12/06/14 1125    PT LONG TERM GOAL #1   Title pt will be I with Advanced HEP 09/22/2014   Baseline FROM PREVIOUSLY DISCHARGED EPISODE   Time 8   Period Weeks   Status Achieved   PT LONG TERM GOAL #2   Title pt will increase trunk mobility by > 20 degrees in all planes to assist wtih safety during driving and ADLS. 04/29/784   Baseline FROM PREVIOUSLY DISCHARGED EPISODE   Time 8   Period Weeks   Status Partially Met   PT LONG TERM GOAL #3   Title pt will increase bil LE strength to >4/5 strength to help with climbing >10 steps and safety using LRAD during weight bearing activities 09/22/2014   Baseline FROM PREVIOUSLY DISCHARGED EPISODE   Time 8   Period Weeks   Status Partially Met   PT LONG TERM GOAL #4   Title pt will demonstrate <4/10 pain during standing / walking for >20 minutes to help with endurance and ADLs 09/22/2014   Baseline FROM PREVIOUSLY DISCHARGED EPISODE   Time 8    Period Weeks   Status Partially Met   PT LONG TERM GOAL #5   Title pt will be able to verbalize and demonstrate technqiues to reduce risk of reinjury of the low back by postural awareness, lifting and carrying mechanics, and HEP 09/22/2014   Baseline FROM PREVIOUSLY DISCHARGED EPISODE   Time 8   Period Weeks   Status Deferred   Additional Long Term Goals   Additional Long Term Goals Yes   PT LONG TERM GOAL #6   Title pt will be I with all HEP given throughout therapy (01/17/2015)   Time 6   Period Weeks   Status New   PT LONG TERM GOAL #7   Title pt will demonstrate bil LE strength to > 4/5 to help promote increased safety with walking and standing activities (01/17/2015)   Time 6   Period Weeks   Status New   PT LONG TERM GOAL #8   Title pt will be able to walk > 20 minutes with having to rest < 3 times to help with improved functional endurance and community ambulation. (01/17/2015)   Time 6   Period Weeks   Status New   PT LONG TERM GOAL  #9   TITLE pt will increase her FOTO score to > 54 to demonstrate improved function at discharged (01/17/2015)   Time 6   Period Weeks   Status New               Plan - 12/27/14 1302    Clinical Impression Statement Patient focus on strengthening and balance.  Close SBA and gait belt uses intermittantly.   Consulted and Agree with Plan of Care Patient        Problem List Patient Active Problem List   Diagnosis Date Noted  . Weakness 11/03/2012    [name],[name] 12/27/2014, 1:06 PM  [facility] [address] [place], [place], [address] Phone: [phone]   Fax:  [phone]     Melvenia Needles, PTA 12/27/2014 1:06 PM Phone: [phone] Fax: [phone]

## 2014-12-31 ENCOUNTER — Ambulatory Visit: Payer: Medicare Other | Admitting: Physical Therapy

## 2015-01-01 ENCOUNTER — Encounter: Payer: PRIVATE HEALTH INSURANCE | Admitting: Physical Therapy

## 2015-01-02 ENCOUNTER — Ambulatory Visit: Payer: Medicare Other | Admitting: Physical Therapy

## 2015-01-02 DIAGNOSIS — M5386 Other specified dorsopathies, lumbar region: Secondary | ICD-10-CM

## 2015-01-02 DIAGNOSIS — M2560 Stiffness of unspecified joint, not elsewhere classified: Secondary | ICD-10-CM

## 2015-01-02 DIAGNOSIS — R269 Unspecified abnormalities of gait and mobility: Secondary | ICD-10-CM | POA: Diagnosis not present

## 2015-01-02 DIAGNOSIS — M545 Low back pain, unspecified: Secondary | ICD-10-CM

## 2015-01-02 DIAGNOSIS — R29898 Other symptoms and signs involving the musculoskeletal system: Secondary | ICD-10-CM

## 2015-01-02 DIAGNOSIS — R2681 Unsteadiness on feet: Secondary | ICD-10-CM

## 2015-01-02 NOTE — Therapy (Signed)
[place], [place], [address] Phone: [phone]   Fax:  [phone]  Physical Therapy Treatment  Patient Details  Name: [name] MRN: [id number] Date of Birth: 03-Aug-1945 Referring Provider:  Merrilee Seashore, MD  Encounter Date: 01/02/2015      PT End of Session - 01/02/15 1231    Visit Number 4   Number of Visits 12   Date for PT Re-Evaluation 01/17/15   Authorization Type Medicare: use KX modifier   PT Start Time 2774   PT Stop Time 1230   PT Time Calculation (min) 45 min   Activity Tolerance Patient tolerated treatment well   Behavior During Therapy [facility] for tasks assessed/performed      Past Medical History  Diagnosis Date  . Arthritis     Past Surgical History  Procedure Laterality Date  . Neck surgery  2010    cerv disc fused   . Colonoscopy    . Bunionectomy  2013    rt foot  . Abdominal hysterectomy  1995  . Muscle biopsy Left 10/03/2012    Procedure: LEFT QUADRICEP MUSCLE BIOPSY;  Surgeon: Odis Hollingshead, MD;  Location: [facility];  Service: General;  Laterality: Left;    There were no vitals filed for this visit.  Visit Diagnosis:  Weakness of both legs  Abnormality of gait  Unsteadiness  Joint stiffness of spine  Decreased ROM of lumbar spine  Bilateral low back pain without sciatica      Subjective Assessment - 01/02/15 1157    Subjective "I am feeling tired today but am doing pretty good today"   Currently in Pain? No/denies   Pain Score 0-No pain   Pain Location Knee   Pain Orientation Left   Pain Descriptors / Indicators Burning                         [facility] Adult PT Treatment/Exercise - 01/02/15 1158    Lumbar Exercises: Aerobic   Stationary Bike Nu step L9  X 10 minutes   Lumbar Exercises: Machines for Strengthening   Cybex Knee Extension 1 plate 2 x 15   Cybex Knee Flexion 2 plates 2 x 10   Leg Press 2  plates 2 x 15, 3 plates 2 x 10 both legs  ball between knees   Lumbar Exercises: Standing   Heel Raises 20 reps   Heel Raises Limitations off edge of step   Functional Squats Limitations on BOSU with 2 hands x 8   Knee/Hip Exercises: Standing   Forward Step Up 1 set;10 reps;Hand Hold: 2   Forward Step Up Limitations on bosu                 PT Education - 01/02/15 1230    Education provided Yes   Education Details educated about progress   Person(s) Educated Patient   Methods Explanation   Comprehension Verbalized understanding          PT Short Term Goals - 12/27/14 1304    PT SHORT TERM GOAL #7   Title PT will be I with inital HEP (12/27/2014)   Time 3   Period Weeks   Status On-going   PT SHORT TERM GOAL #8   Title pt will increase her FOTO score by >5 points to demostrate improving function (12/27/2014)   Time 3   Period Weeks   Status On-going   PT SHORT TERM GOAL #  9   TITLE pt will be able to stand for > 10 minutes to demonstrate improving functional endurance (12/27/2014)   Time 3   Period Weeks   Status On-going           PT Long Term Goals - 01/02/15 1233    PT LONG TERM GOAL #6   Title pt will be I with all HEP given throughout therapy (01/17/2015)   Time 6   Period Weeks   Status On-going   PT LONG TERM GOAL #7   Title pt will demonstrate bil LE strength to > 4/5 to help promote increased safety with walking and standing activities (01/17/2015)   Time 6   Period Weeks   Status On-going   PT LONG TERM GOAL #8   Title pt will be able to walk > 20 minutes with having to rest < 3 times to help with improved functional endurance and community ambulation. (01/17/2015)   Time 6   Period Weeks   Status On-going   PT LONG TERM GOAL  #9   TITLE pt will increase her FOTO score to > 54 to demonstrate improved function at discharged (01/17/2015)   Time 6   Period Weeks   Status On-going               Plan - 01/02/15 1231    Clinical  Impression Statement Xiara continues to make progress with strength and confidence with walking/standing and exercises. She reported no problems and was able to complete all exercises today without complaint. She reported she couldn't tell if the L leg was working, but after having her try to lift the weight with only the R she was able to perform  LAQ or hamstring curls which she was very surprised and pleased to see.    PT Next Visit Plan [facility] strengthening, work on endurance activities, balance training   Consulted and Agree with Plan of Care Patient        Problem List Patient Active Problem List   Diagnosis Date Noted  . Weakness 11/03/2012   Starr Lake PT, DPT, LAT, ATC  01/02/2015  12:35 PM      [facility] [address] [place], [place], [address] Phone: [phone]   Fax:  [phone]

## 2015-01-08 ENCOUNTER — Ambulatory Visit: Payer: Medicare Other | Admitting: Physical Therapy

## 2015-01-08 DIAGNOSIS — R29898 Other symptoms and signs involving the musculoskeletal system: Secondary | ICD-10-CM | POA: Diagnosis not present

## 2015-01-08 DIAGNOSIS — M2560 Stiffness of unspecified joint, not elsewhere classified: Secondary | ICD-10-CM | POA: Diagnosis not present

## 2015-01-08 DIAGNOSIS — R269 Unspecified abnormalities of gait and mobility: Secondary | ICD-10-CM | POA: Diagnosis not present

## 2015-01-08 DIAGNOSIS — M545 Low back pain: Secondary | ICD-10-CM | POA: Diagnosis not present

## 2015-01-08 DIAGNOSIS — R2681 Unsteadiness on feet: Secondary | ICD-10-CM | POA: Diagnosis not present

## 2015-01-08 DIAGNOSIS — M5386 Other specified dorsopathies, lumbar region: Secondary | ICD-10-CM

## 2015-01-08 NOTE — Therapy (Signed)
[place], [place], [address] Phone: [phone]   Fax:  [phone]  Physical Therapy Treatment  Patient Details  Name: DEMARIS BOUSQUET MRN: [id number] Date of Birth: 1945-10-19 No Data Recorded  Encounter Date: 01/08/2015      PT End of Session - 01/08/15 1303    Visit Number 6   Date for PT Re-Evaluation 01/17/15   PT Start Time 1150   PT Stop Time 1230   PT Time Calculation (min) 40 min   Activity Tolerance Patient tolerated treatment well   Behavior During Therapy [facility] for tasks assessed/performed      Past Medical History  Diagnosis Date  . Arthritis     Past Surgical History  Procedure Laterality Date  . Neck surgery  2010    cerv disc fused   . Colonoscopy    . Bunionectomy  2013    rt foot  . Abdominal hysterectomy  1995  . Muscle biopsy Left 10/03/2012    Procedure: LEFT QUADRICEP MUSCLE BIOPSY;  Surgeon: Odis Hollingshead, MD;  Location: [facility];  Service: General;  Laterality: Left;    There were no vitals filed for this visit.  Visit Diagnosis:  Weakness of both legs  Abnormality of gait  Unsteadiness  Joint stiffness of spine  Decreased ROM of lumbar spine      Subjective Assessment - 01/08/15 1154    Subjective No pain, LT Knee stings.   Nothing seems to make a difference, sometimes Lt shin swells.     Currently in Pain? No/denies                         [facility] Adult PT Treatment/Exercise - 01/08/15 1155    Lumbar Exercises: Stretches   Lower Trunk Rotation Limitations upper trunk rotations  to practice moving whole body to turn to look.     Lumbar Exercises: Aerobic   Stationary Bike Nu step L9 11 minutes   Lumbar Exercises: Machines for Strengthening   Cybex Knee Flexion 2 plates .  Both and RT only Rt eccentris too.     Leg Press 1, 2, 3 plates with ball at knees    Lumbar Exercises: Sidelying   Clam 10 reps   Hip Abduction  10 reps  AA lift hold 5 seconds and controlled lowering.     Other Sidelying Lumbar Exercises RT sidelying isometric hip into mat 5 reps 10 second holds.                    PT Short Term Goals - 01/08/15 1308    PT SHORT TERM GOAL #7   Title PT will be I with inital HEP (12/27/2014)   Time 3   Period Weeks   Status On-going   PT SHORT TERM GOAL #8   Title pt will increase her FOTO score by >5 points to demostrate improving function (12/27/2014)   Time 3   Period Weeks   Status Unable to assess   PT SHORT TERM GOAL #9   TITLE pt will be able to stand for > 10 minutes to demonstrate improving functional endurance (12/27/2014)   Time 3   Period Weeks   Status On-going           PT Long Term Goals - 01/02/15 1233    PT LONG TERM GOAL #6   Title pt will be I with all HEP given throughout therapy (01/17/2015)  Time 6   Period Weeks   Status On-going   PT LONG TERM GOAL #7   Title pt will demonstrate bil LE strength to > 4/5 to help promote increased safety with walking and standing activities (01/17/2015)   Time 6   Period Weeks   Status On-going   PT LONG TERM GOAL #8   Title pt will be able to walk > 20 minutes with having to rest < 3 times to help with improved functional endurance and community ambulation. (01/17/2015)   Time 6   Period Weeks   Status On-going   PT LONG TERM GOAL  #9   TITLE pt will increase her FOTO score to > 54 to demonstrate improved function at discharged (01/17/2015)   Time 6   Period Weeks   Status On-going               Plan - 01/08/15 1304    Clinical Impression Statement aPatient has some neck tightness after leg press,  She says she has the all the time.  Better with brief trigger point to levator.  Noticed hardware moving when patient turns her neck.  Not painful.  She plans to ask her MD about it the next visit.    Patient feels her leg is easier to move post session.   PT Next Visit Plan Have [facility] palpate hardware.   Continue hip and leg strengthening closed chain   Consulted and Agree with Plan of Care Patient        Problem List Patient Active Problem List   Diagnosis Date Noted  . Weakness 11/03/2012    [name],[name] 01/08/2015, 1:10 PM  [facility] [address] [place], [place], [address] Phone: [phone]   Fax:  [phone]  Name: DEMIKA LANGENDERFER MRN: [id number] Date of Birth: [birth year]    Melvenia Needles, PTA 01/08/2015 1:10 PM Phone: [phone] Fax: [phone]

## 2015-01-10 ENCOUNTER — Ambulatory Visit: Payer: Medicare Other | Admitting: Physical Therapy

## 2015-01-10 DIAGNOSIS — M545 Low back pain, unspecified: Secondary | ICD-10-CM

## 2015-01-10 DIAGNOSIS — M2560 Stiffness of unspecified joint, not elsewhere classified: Secondary | ICD-10-CM | POA: Diagnosis not present

## 2015-01-10 DIAGNOSIS — R269 Unspecified abnormalities of gait and mobility: Secondary | ICD-10-CM | POA: Diagnosis not present

## 2015-01-10 DIAGNOSIS — R2681 Unsteadiness on feet: Secondary | ICD-10-CM

## 2015-01-10 DIAGNOSIS — R29898 Other symptoms and signs involving the musculoskeletal system: Secondary | ICD-10-CM

## 2015-01-10 DIAGNOSIS — M5386 Other specified dorsopathies, lumbar region: Secondary | ICD-10-CM

## 2015-01-10 NOTE — Therapy (Signed)
[place], [place], [address] Phone: [phone]   Fax:  [phone]  Physical Therapy Treatment  Patient Details  Name: [name] MRN: [id number] Date of Birth: 1945-05-07 No Data Recorded  Encounter Date: 01/10/2015      PT End of Session - 01/10/15 1248    Visit Number 7   Number of Visits 12   Date for PT Re-Evaluation 01/17/15   Authorization Type Medicare: use KX modifier   PT Start Time 5643   PT Stop Time 1230   PT Time Calculation (min) 45 min   Activity Tolerance Patient tolerated treatment well   Behavior During Therapy [name] for tasks assessed/performed      Past Medical History  Diagnosis Date  . Arthritis     Past Surgical History  Procedure Laterality Date  . Neck surgery  2010    cerv disc fused   . Colonoscopy    . Bunionectomy  2013    rt foot  . Abdominal hysterectomy  1995  . Muscle biopsy Left 10/03/2012    Procedure: LEFT QUADRICEP MUSCLE BIOPSY;  Surgeon: Odis Hollingshead, MD;  Location: [facility];  Service: General;  Laterality: Left;    There were no vitals filed for this visit.  Visit Diagnosis:  Weakness of both legs  Abnormality of gait  Unsteadiness  Joint stiffness of spine  Decreased ROM of lumbar spine  Bilateral low back pain without sciatica      Subjective Assessment - 01/10/15 1148    Subjective "I am feeling sore in my thighs and its from the bike"    Currently in Pain? No/denies   Pain Score 0-No pain   Pain Location Knee   Pain Orientation Left                         [facility] Adult PT Treatment/Exercise - 01/10/15 1202    Lumbar Exercises: Aerobic   Stationary Bike NuStep L 8 x 10 min  with LE only   Lumbar Exercises: Machines for Strengthening   Cybex Knee Extension 2 plates 2 x 10   Cybex Knee Flexion 2 plates .  Both and RT only Rt eccentris too.     Leg Press 1 plate x 10, 2 plates x 10, 3  plates x 10, 4 plates x 10, 5 plates x 10  with ball between knees   Lumbar Exercises: Standing   Other Standing Lumbar Exercises step downs with 4 inch step 2 x 10 ea.   with bil hand hold on bar   Lumbar Exercises: Sidelying   Clam 10 reps   Other Sidelying Lumbar Exercises RT sidelying isometric hip into mat 5 reps 10 second holds.     Knee/Hip Exercises: Standing   Heel Raises Both;1 set;20 reps  off edge of step   Forward Step Up 1 set;10 reps;Hand Hold: 2   Forward Step Up Limitations on bosu                 PT Education - 01/10/15 1248    Education provided Yes   Education Details educated about muscle soreness related to DOMS   Person(s) Educated Patient   Methods Explanation   Comprehension Verbalized understanding          PT Short Term Goals - 01/08/15 1308    PT SHORT TERM GOAL #7   Title PT will be I with inital HEP (12/27/2014)  Time 3   Period Weeks   Status On-going   PT SHORT TERM GOAL #8   Title pt will increase her FOTO score by >5 points to demostrate improving function (12/27/2014)   Time 3   Period Weeks   Status Unable to assess   PT SHORT TERM GOAL #9   TITLE pt will be able to stand for > 10 minutes to demonstrate improving functional endurance (12/27/2014)   Time 3   Period Weeks   Status On-going           PT Long Term Goals - 01/02/15 1233    PT LONG TERM GOAL #6   Title pt will be I with all HEP given throughout therapy (01/17/2015)   Time 6   Period Weeks   Status On-going   PT LONG TERM GOAL #7   Title pt will demonstrate bil LE strength to > 4/5 to help promote increased safety with walking and standing activities (01/17/2015)   Time 6   Period Weeks   Status On-going   PT LONG TERM GOAL #8   Title pt will be able to walk > 20 minutes with having to rest < 3 times to help with improved functional endurance and community ambulation. (01/17/2015)   Time 6   Period Weeks   Status On-going   PT LONG TERM GOAL  #9    TITLE pt will increase her FOTO score to > 54 to demonstrate improved function at discharged (01/17/2015)   Time 6   Period Weeks   Status On-going               Plan - 01/10/15 1248    Clinical Impression Statement [name] reports today having increased soreness in the thighs due to riding her bike at home and was nervous that she may have injured her self. Discussed with pt that she is most likely experiencing delayed onset muscle soreness. She was able to complete all exercises today with mild complaint of fagiituge. She rpeorted following todays exercises that she felt decreased soreness and pain in her quads/hip flexors. Palpated hardware in the neck per concern from PTA in previous session, and she reports no pain or discomfort in the neck where she had previous surgery, and hardware appears to feel stable during cervical movements   PT Next Visit Plan Continue hip and leg strengthening closed chain   Consulted and Agree with Plan of Care Patient        Problem List Patient Active Problem List   Diagnosis Date Noted  . Weakness 11/03/2012   Starr Lake PT, DPT, LAT, ATC  01/10/2015  12:55 PM   [facility] [address] [place], [place], [address] Phone: [phone]   Fax:  [phone]  Name: [name] MRN: [id number] Date of Birth: Apr 19, 1945

## 2015-01-15 ENCOUNTER — Ambulatory Visit: Payer: Medicare Other | Admitting: Physical Therapy

## 2015-01-15 DIAGNOSIS — R2681 Unsteadiness on feet: Secondary | ICD-10-CM

## 2015-01-15 DIAGNOSIS — R29898 Other symptoms and signs involving the musculoskeletal system: Secondary | ICD-10-CM

## 2015-01-15 DIAGNOSIS — M545 Low back pain: Secondary | ICD-10-CM | POA: Diagnosis not present

## 2015-01-15 DIAGNOSIS — R269 Unspecified abnormalities of gait and mobility: Secondary | ICD-10-CM | POA: Diagnosis not present

## 2015-01-15 DIAGNOSIS — M2560 Stiffness of unspecified joint, not elsewhere classified: Secondary | ICD-10-CM

## 2015-01-15 NOTE — Therapy (Signed)
[place], [place], [address] Phone: [phone]   Fax:  [phone]  Physical Therapy Treatment  Patient Details  Name: [name] MRN: [id number] Date of Birth: 1945-07-31 No Data Recorded  Encounter Date: 01/15/2015      PT End of Session - 01/15/15 1310    Visit Number 8   Number of Visits 12   Date for PT Re-Evaluation 01/17/15   PT Start Time 1152   PT Stop Time 8768   PT Time Calculation (min) 42 min   Activity Tolerance Patient tolerated treatment well;No increased pain   Behavior During Therapy [facility] for tasks assessed/performed      Past Medical History  Diagnosis Date  . Arthritis     Past Surgical History  Procedure Laterality Date  . Neck surgery  2010    cerv disc fused   . Colonoscopy    . Bunionectomy  2013    rt foot  . Abdominal hysterectomy  1995  . Muscle biopsy Left 10/03/2012    Procedure: LEFT QUADRICEP MUSCLE BIOPSY;  Surgeon: Odis Hollingshead, MD;  Location: [facility];  Service: General;  Laterality: Left;    There were no vitals filed for this visit.  Visit Diagnosis:  Weakness of both legs  Abnormality of gait  Unsteadiness  Joint stiffness of spine                       [facility] Adult PT Treatment/Exercise - 01/15/15 1224    Lumbar Exercises: Machines for Strengthening   Leg Press 1 plate x 10, 2 plates x 10, 3 plates x 10, 4 plates x 10, 5 plates x 10   Lumbar Exercises: Standing   Heel Raises 10 reps   Heel Raises Limitations unable rt single leg, LT 4 reps small lift   Knee/Hip Exercises: Standing   Forward Step Up Both;1 set;10 reps;Hand Hold: 1;Step Height: 6"   SLS RT brief, LT 4-5 seconds max several attempts.     Gait Training --  pre gait weight shifting with forward and back steps,cues                  PT Short Term Goals - 01/15/15 1313    PT SHORT TERM GOAL #7   Title PT will be I with inital  HEP (12/27/2014)   Time 3   Period Weeks   Status On-going   PT SHORT TERM GOAL #8   Title pt will increase her FOTO score by >5 points to demostrate improving function (12/27/2014)   Time 3   Period Weeks   Status Unable to assess   PT SHORT TERM GOAL #9   TITLE pt will be able to stand for > 10 minutes to demonstrate improving functional endurance (12/27/2014)   Baseline able to do in clinc   Time 3   Period Weeks   Status Achieved           PT Long Term Goals - 01/02/15 1233    PT LONG TERM GOAL #6   Title pt will be I with all HEP given throughout therapy (01/17/2015)   Time 6   Period Weeks   Status On-going   PT LONG TERM GOAL #7   Title pt will demonstrate bil LE strength to > 4/5 to help promote increased safety with walking and standing activities (01/17/2015)   Time 6   Period Weeks   Status On-going  PT LONG TERM GOAL #8   Title pt will be able to walk > 20 minutes with having to rest < 3 times to help with improved functional endurance and community ambulation. (01/17/2015)   Time 6   Period Weeks   Status On-going   PT LONG TERM GOAL  #9   TITLE pt will increase her FOTO score to > 54 to demonstrate improved function at discharged (01/17/2015)   Time 6   Period Weeks   Status On-going               Plan - 01/15/15 1311    Clinical Impression Statement Closed chain focus for strengthening.  Gait improved with cues.   PT Next Visit Plan Continue hip and leg strengthening closed chain   Family Member Consulted husband        Problem List Patient Active Problem List   Diagnosis Date Noted  . Weakness 11/03/2012    [name] 01/15/2015, 1:16 PM  [place], [place], [address] Phone: [phone]   Fax:  [phone]  Name: [name] MRN: [id number] Date of Birth: 18-Jan-1946    Melvenia Needles, PTA 01/15/2015 1:16 PM Phone: [phone] Fax:  [phone]

## 2015-01-17 ENCOUNTER — Encounter: Payer: PRIVATE HEALTH INSURANCE | Admitting: Physical Therapy

## 2015-01-22 ENCOUNTER — Ambulatory Visit: Payer: Medicare Other | Attending: Neurosurgery | Admitting: Physical Therapy

## 2015-01-22 DIAGNOSIS — M545 Low back pain, unspecified: Secondary | ICD-10-CM

## 2015-01-22 DIAGNOSIS — R269 Unspecified abnormalities of gait and mobility: Secondary | ICD-10-CM | POA: Diagnosis not present

## 2015-01-22 DIAGNOSIS — R29898 Other symptoms and signs involving the musculoskeletal system: Secondary | ICD-10-CM | POA: Insufficient documentation

## 2015-01-22 DIAGNOSIS — R2681 Unsteadiness on feet: Secondary | ICD-10-CM | POA: Diagnosis not present

## 2015-01-22 DIAGNOSIS — M2560 Stiffness of unspecified joint, not elsewhere classified: Secondary | ICD-10-CM | POA: Diagnosis not present

## 2015-01-22 DIAGNOSIS — M5386 Other specified dorsopathies, lumbar region: Secondary | ICD-10-CM

## 2015-01-22 NOTE — Therapy (Signed)
[place], [place], [address] Phone: [phone]   Fax:  [phone]  Physical Therapy Treatment/ Recertification  Patient Details  Name: [name] MRN: [id number] Date of Birth: [birth year] No Data Recorded  Encounter Date: 01/22/2015      PT End of Session - 01/22/15 1230    Visit Number 9   Number of Visits 13   Date for PT Re-Evaluation 02/19/15   Authorization Type Medicare: use KX modifier   PT Start Time 1152  pt arrived 8 minutes late today   PT Stop Time 1231   PT Time Calculation (min) 39 min   Activity Tolerance Patient tolerated treatment well   Behavior During Therapy [facility] for tasks assessed/performed      Past Medical History  Diagnosis Date  . Arthritis     Past Surgical History  Procedure Laterality Date  . Neck surgery  2010    cerv disc fused   . Colonoscopy    . Bunionectomy  2013    rt foot  . Abdominal hysterectomy  1995  . Muscle biopsy Left 10/03/2012    Procedure: LEFT QUADRICEP MUSCLE BIOPSY;  Surgeon: Odis Hollingshead, MD;  Location: [place];  Service: General;  Laterality: Left;    There were no vitals filed for this visit.  Visit Diagnosis:  Weakness of both legs - Plan: PT plan of care cert/re-cert  Abnormality of gait - Plan: PT plan of care cert/re-cert  Unsteadiness - Plan: PT plan of care cert/re-cert  Joint stiffness of spine - Plan: PT plan of care cert/re-cert  Decreased ROM of lumbar spine - Plan: PT plan of care cert/re-cert  Bilateral low back pain without sciatica - Plan: PT plan of care cert/re-cert      Subjective Assessment - 01/22/15 1155    Subjective "everything is going well, its better this time around"    Currently in Pain? No/denies   Pain Location Knee   Pain Orientation Left   Pain Descriptors / Indicators Burning   Pain Onset More than a month ago   Pain Frequency Intermittent   Aggravating Factors  not  sure what makes worse   Pain Relieving Factors not sure   Pain Score 0   Pain Location Leg   Pain Orientation Right;Left            [facility] PT Assessment - 01/22/15 1157    Assessment   Medical Diagnosis s/p lumbar fusion and bil le weakness   Onset Date/Surgical Date 04/27/14   Next MD Visit --  november 2016   [place] residence   Living Arrangements Spouse/significant other   Available Help at Discharge Available 24 hours/day   Type of [place] to enter   Entrance Stairs-Number of Steps 1   [place] One level   [place] - 2 wheels;Cane - single point   Prior Function   Level of Independence Independent with basic ADLs;Independent with homemaking with ambulation;Independent with gait;Independent with transfers   Vocation Retired   Visual merchandiser.    Observation/Other Assessments   Focus on Therapeutic Outcomes (FOTO)  48% limited   AROM   Lumbar Flexion 58   Lumbar Extension 14   Lumbar - Right Side Bend 35   Lumbar - Left Side Bend 25   Strength   Right Hip Flexion 3+/5   Right Hip  ABduction 4-/5   Right Hip ADduction 4/5   Left Hip Flexion 4/5   Left Hip ABduction 4/5   Left Hip ADduction 4/5   Right Knee Flexion 4/5   Right Knee Extension 4/5   Left Knee Flexion 4+/5   Left Knee Extension 4+/5   Right Ankle Dorsiflexion 4/5   Right Ankle Plantar Flexion 4+/5   Right Ankle Inversion 4-/5   Right Ankle Eversion 4-/5   Left Ankle Dorsiflexion 4+/5   Left Ankle Plantar Flexion 4+/5   Left Ankle Inversion 4+/5   Left Ankle Eversion 4+/5                     [facility] Adult PT Treatment/Exercise - 01/22/15 0001    Self-Care   Self-Care Other Self-Care Comments   ADL's continue with home exercises and walking to continue with endurance and gait training.    Lumbar Exercises: Aerobic   Stationary Bike NuStep L 8 x 10 min   Knee/Hip  Exercises: Standing   SLS with Vectors 2 x 10 with red theraband   Other Standing Knee Exercises marching with 3# 2 2 x 10                PT Education - 01/22/15 1230    Education provided Yes   Education Details hip flexor muscle anatomy, HEP review, and updated POC   Person(s) Educated Patient   Methods Explanation   Comprehension Verbalized understanding          PT Short Term Goals - 01/22/15 1234    PT SHORT TERM GOAL #7   Title PT will be I with inital HEP (12/27/2014)   Time 3   Period Weeks   Status Achieved   PT SHORT TERM GOAL #8   Title pt will increase her FOTO score by >5 points to demostrate improving function (12/27/2014)   Time 3   Period Weeks   Status Achieved   PT SHORT TERM GOAL #9   TITLE pt will be able to stand for > 10 minutes to demonstrate improving functional endurance (12/27/2014)   Baseline able to do in clinc   Time 3   Period Weeks   Status Achieved           PT Long Term Goals - 01/22/15 1237    PT LONG TERM GOAL #6   Title pt will be I with all HEP given throughout therapy (01/17/2015)   Time 6   Period Weeks   Status On-going   PT LONG TERM GOAL #7   Title pt will demonstrate bil LE strength to > 4/5 to help promote increased safety with walking and standing activities (01/17/2015)   Time 6   Period Weeks   Status On-going   PT LONG TERM GOAL #8   Title pt will be able to walk > 20 minutes with having to rest < 3 times to help with improved functional endurance and community ambulation. (01/17/2015)   Time 6   Period Weeks   Status On-going   PT LONG TERM GOAL  #9   TITLE pt will increase her FOTO score to > 54 to demonstrate improved function at discharged (01/17/2015)   Time 6   Period Weeks   Status On-going               Plan - 01/22/15 1231    Clinical Impression Statement Mrs. Wogan continues to make progress with strength of the RLE. She reports that she  has been [place]  and has been able to get up to walking 2 hours with only 3 rest breaks. she met all STGs today. She exhibits a more efficient gait cycle but still demonstates limited hip flexion strength. Discussed and updated POC to included 1 x a week for the next 4 weeks in order to transition to independent exercise.    Pt will benefit from skilled therapeutic intervention in order to improve on the following deficits Abnormal gait;Impaired flexibility;Improper body mechanics;Postural dysfunction;Difficulty walking;Decreased safety awareness;Decreased balance;Increased muscle spasms;Decreased strength;Decreased endurance;Decreased activity tolerance;Decreased mobility   PT Treatment/Interventions ADLs/Self Care Home Management;Electrical Stimulation;Gait training;Therapeutic exercise;Patient/family education;Balance training;Stair training;Moist Heat;Manual techniques;Neuromuscular re-education;Functional mobility training;Cryotherapy;Ultrasound;DME Instruction;Therapeutic activities;Dry needling;Passive range of motion   PT Next Visit Plan Continue hip and leg strengthening closed chain   PT Home Exercise Plan HEP review, standing marching, and SLR   Consulted and Agree with Plan of Care Patient          G-Codes - 02/08/2015 1238    Functional Assessment Tool Used 48% limited   Functional Limitation Mobility: Walking and moving around   Mobility: Walking and Moving Around Current Status [phone]) At least 40 percent but less than 60 percent impaired, limited or restricted   Mobility: Walking and Moving Around Goal Status [phone]) At least 20 percent but less than 40 percent impaired, limited or restricted      Problem List Patient Active Problem List   Diagnosis Date Noted  . Weakness 11/03/2012   [place] PT, DPT, LAT, ATC  02/08/2015  12:47 PM    [facility] [address] [place], [place], [address] Phone: [phone]   Fax:   [phone]  Name: [name] MRN: [id number] Date of Birth: Feb 14, 1946

## 2015-01-24 ENCOUNTER — Encounter: Payer: PRIVATE HEALTH INSURANCE | Admitting: Physical Therapy

## 2015-02-06 ENCOUNTER — Ambulatory Visit: Payer: Medicare Other | Admitting: Physical Therapy

## 2015-02-12 ENCOUNTER — Ambulatory Visit: Payer: Medicare Other | Admitting: Physical Therapy

## 2015-02-12 DIAGNOSIS — R2681 Unsteadiness on feet: Secondary | ICD-10-CM

## 2015-02-12 DIAGNOSIS — R29898 Other symptoms and signs involving the musculoskeletal system: Secondary | ICD-10-CM | POA: Diagnosis not present

## 2015-02-12 DIAGNOSIS — M2560 Stiffness of unspecified joint, not elsewhere classified: Secondary | ICD-10-CM

## 2015-02-12 DIAGNOSIS — R269 Unspecified abnormalities of gait and mobility: Secondary | ICD-10-CM | POA: Diagnosis not present

## 2015-02-12 DIAGNOSIS — M545 Low back pain, unspecified: Secondary | ICD-10-CM

## 2015-02-12 DIAGNOSIS — M5386 Other specified dorsopathies, lumbar region: Secondary | ICD-10-CM

## 2015-02-12 NOTE — Therapy (Signed)
[place], [place], [address] Phone: [phone]   Fax:  [phone]  Physical Therapy Treatment / Discharge note  Patient Details  Name: [name] MRN: [id number] Date of Birth: 03-19-1946 No Data Recorded  Encounter Date: 02/12/2015      PT End of Session - 02/12/15 1230    Visit Number 10   Number of Visits 13   Date for PT Re-Evaluation 02/19/15   Authorization Type Medicare: use KX modifier   PT Start Time 1540  pt arrived 10 minues late   PT Stop Time 1230   PT Time Calculation (min) 35 min   Activity Tolerance Patient tolerated treatment well   Behavior During Therapy [facility] for tasks assessed/performed      Past Medical History  Diagnosis Date  . Arthritis     Past Surgical History  Procedure Laterality Date  . Neck surgery  2010    cerv disc fused   . Colonoscopy    . Bunionectomy  2013    rt foot  . Abdominal hysterectomy  1995  . Muscle biopsy Left 10/03/2012    Procedure: LEFT QUADRICEP MUSCLE BIOPSY;  Surgeon: Odis Hollingshead, MD;  Location: [place];  Service: General;  Laterality: Left;    There were no vitals filed for this visit.  Visit Diagnosis:  Weakness of both legs  Abnormality of gait  Unsteadiness  Joint stiffness of spine  Decreased ROM of lumbar spine  Bilateral low back pain without sciatica      Subjective Assessment - 02/12/15 1159    Subjective "My back popped last night and I am having some soreness in the low"    Currently in Pain? Yes   Pain Location Knee   Pain Orientation Left   Pain Type Acute pain   Multiple Pain Sites Yes   Pain Score 0   Pain Location Leg   Pain Orientation Right;Left   Pain Descriptors / Indicators --  weakness   Pain Onset More than a month ago   Pain Frequency Constant   Aggravating Factors  prolonged standing, walking, stairs   Pain Relieving Factors sitting and resting   Pain Score 2   Pain Location Back   Pain Orientation Mid;Lower   Pain Descriptors / Indicators Aching;Tightness   Pain Type Chronic pain   Pain Onset More than a month ago   Pain Frequency Intermittent   Aggravating Factors  sitting standing/ walking            [facility] PT Assessment - 02/12/15 1212    Observation/Other Assessments   Focus on Therapeutic Outcomes (FOTO)  49%limited   Strength   Right Hip Flexion 3+/5   Right Hip Extension 4-/5   Right Hip ABduction 4-/5   Right Hip ADduction 4+/5   Left Hip ABduction 4/5   Left Hip ADduction 4+/5   Right Knee Flexion 4+/5   Right Knee Extension 4+/5   Left Knee Flexion 4+/5   Left Knee Extension 4+/5   Right Ankle Dorsiflexion 4/5   Right Ankle Plantar Flexion 4+/5   Right Ankle Inversion 4-/5   Right Ankle Eversion 4-/5   Left Ankle Dorsiflexion 4+/5   Left Ankle Plantar Flexion 4+/5   Left Ankle Inversion 4+/5   Left Ankle Eversion 4+/5                     [facility] Adult PT Treatment/Exercise - 02/12/15 1204  Lumbar Exercises: Aerobic   Stationary Bike NuStep L 8 x 5 min   Lumbar Exercises: Seated   Other Seated Lumbar Exercises LAQ  2 x 15 5#   Other Seated Lumbar Exercises seated marching 4 x 10 3#                PT Education - 2015/03/06 1229    Education provided Yes   Education Details educated on progress and to continue with progress of walking / home exercise   Person(s) Educated Patient   Methods Explanation   Comprehension Verbalized understanding          PT Short Term Goals - 01/22/15 1234    PT SHORT TERM GOAL #7   Title PT will be I with inital HEP (12/27/2014)   Time 3   Period Weeks   Status Achieved   PT SHORT TERM GOAL #8   Title pt will increase her FOTO score by >5 points to demostrate improving function (12/27/2014)   Time 3   Period Weeks   Status Achieved   PT SHORT TERM GOAL #9   TITLE pt will be able to stand for > 10 minutes to demonstrate improving functional endurance  (12/27/2014)   Baseline able to do in clinc   Time 3   Period Weeks   Status Achieved           PT Long Term Goals - Mar 06, 2015 1221    PT LONG TERM GOAL #6   Title pt will be I with all HEP given throughout therapy (01/17/2015)   Time 6   Period Weeks   Status Achieved   PT LONG TERM GOAL #7   Title pt will demonstrate bil LE strength to > 4/5 to help promote increased safety with walking and standing activities (01/17/2015)   Time 6   Period Weeks   Status Partially Met   PT LONG TERM GOAL #8   Title pt will be able to walk > 20 minutes with having to rest < 3 times to help with improved functional endurance and community ambulation. (01/17/2015)   Time 6   Period Weeks   Status Achieved   PT LONG TERM GOAL  #9   TITLE pt will increase her FOTO score to > 54 to demonstrate improved function at discharged (01/17/2015)   Time 6   Period Weeks   Status Partially Met               Plan - 06-Mar-2015 1230    Clinical Impression Statement [name] reports that she had a pop in the low back last night and still has some pain in the low back. She reports she plans to set up an appointment with her doctor to assess the low back. She was able to do all exercises today with no increase in pain. she has maintained her current ROM in the low back and strength of bil LE. she met all goals except for partially meeting LTG #7& #9. She reports she is able to maintain and progress her current level of function independently and will be DC form physical therapy.    PT Next Visit Plan D/C   PT Home Exercise Plan HEP review   Consulted and Agree with Plan of Care Patient          G-Codes - March 06, 2015 1234    Functional Assessment Tool Used FOTO 49%   Functional Limitation Mobility: Walking and moving around   Mobility: Walking and Moving Around  Goal Status [phone]) At least 20 percent but less than 40 percent impaired, limited or restricted   Mobility: Walking and Moving Around  Discharge Status [phone]) At least 40 percent but less than 60 percent impaired, limited or restricted      Problem List Patient Active Problem List   Diagnosis Date Noted  . Weakness 11/03/2012   [name] PT, DPT, LAT, ATC  02/12/2015  12:39 PM    [facility] [address] [place], [place], [address] Phone: [phone]   Fax:  [phone]  Name: [name] MRN: [id number] Date of Birth: 1945/10/08    PHYSICAL THERAPY DISCHARGE SUMMARY  Visits from Start of Care: 10  Current functional level related to goals / functional outcomes: FOTO 49% limited   Remaining deficits: Intermittent low back tightness, weakness of RLE hip flexors.    Education / Equipment: HEP, progression   Plan: Patient agrees to discharge.  Patient goals were partially met. Patient is being discharged due to being pleased with the current functional level.  ?????        Sani Loiseau PT, DPT, LAT, ATC  02/12/2015  12:40 PM

## 2015-02-13 DIAGNOSIS — H40023 Open angle with borderline findings, high risk, bilateral: Secondary | ICD-10-CM | POA: Diagnosis not present

## 2015-02-13 DIAGNOSIS — H25013 Cortical age-related cataract, bilateral: Secondary | ICD-10-CM | POA: Diagnosis not present

## 2015-02-13 DIAGNOSIS — H43812 Vitreous degeneration, left eye: Secondary | ICD-10-CM | POA: Diagnosis not present

## 2015-02-13 DIAGNOSIS — H2513 Age-related nuclear cataract, bilateral: Secondary | ICD-10-CM | POA: Diagnosis not present

## 2015-02-19 ENCOUNTER — Ambulatory Visit: Payer: Medicare Other | Admitting: Physical Therapy

## 2015-02-26 ENCOUNTER — Encounter: Payer: PRIVATE HEALTH INSURANCE | Admitting: Physical Therapy

## 2015-03-28 DIAGNOSIS — M4806 Spinal stenosis, lumbar region: Secondary | ICD-10-CM | POA: Diagnosis not present

## 2015-04-17 DIAGNOSIS — F329 Major depressive disorder, single episode, unspecified: Secondary | ICD-10-CM | POA: Diagnosis not present

## 2015-04-17 DIAGNOSIS — N3941 Urge incontinence: Secondary | ICD-10-CM | POA: Diagnosis not present

## 2015-04-17 DIAGNOSIS — G959 Disease of spinal cord, unspecified: Secondary | ICD-10-CM | POA: Diagnosis not present

## 2015-04-17 DIAGNOSIS — M6281 Muscle weakness (generalized): Secondary | ICD-10-CM | POA: Diagnosis not present

## 2015-04-23 DIAGNOSIS — M4806 Spinal stenosis, lumbar region: Secondary | ICD-10-CM | POA: Diagnosis not present

## 2015-04-23 DIAGNOSIS — M5124 Other intervertebral disc displacement, thoracic region: Secondary | ICD-10-CM | POA: Diagnosis not present

## 2015-05-14 DIAGNOSIS — Z981 Arthrodesis status: Secondary | ICD-10-CM | POA: Diagnosis not present

## 2015-05-30 DIAGNOSIS — M5416 Radiculopathy, lumbar region: Secondary | ICD-10-CM | POA: Diagnosis not present

## 2015-05-30 DIAGNOSIS — G8928 Other chronic postprocedural pain: Secondary | ICD-10-CM | POA: Diagnosis not present

## 2015-07-05 DIAGNOSIS — Z1231 Encounter for screening mammogram for malignant neoplasm of breast: Secondary | ICD-10-CM | POA: Diagnosis not present

## 2015-07-05 DIAGNOSIS — Z803 Family history of malignant neoplasm of breast: Secondary | ICD-10-CM | POA: Diagnosis not present

## 2015-07-09 ENCOUNTER — Other Ambulatory Visit: Payer: Self-pay | Admitting: Internal Medicine

## 2015-07-09 DIAGNOSIS — R52 Pain, unspecified: Secondary | ICD-10-CM

## 2015-07-09 DIAGNOSIS — G959 Disease of spinal cord, unspecified: Secondary | ICD-10-CM | POA: Diagnosis not present

## 2015-07-09 DIAGNOSIS — R609 Edema, unspecified: Secondary | ICD-10-CM

## 2015-07-09 DIAGNOSIS — R600 Localized edema: Secondary | ICD-10-CM | POA: Diagnosis not present

## 2015-07-10 ENCOUNTER — Ambulatory Visit
Admission: RE | Admit: 2015-07-10 | Discharge: 2015-07-10 | Disposition: A | Discharge status: Home / Self Care | Payer: Medicare Other | Source: Ambulatory Visit | Attending: Internal Medicine | Admitting: Internal Medicine

## 2015-07-10 DIAGNOSIS — R600 Localized edema: Secondary | ICD-10-CM | POA: Diagnosis not present

## 2015-07-10 DIAGNOSIS — R609 Edema, unspecified: Secondary | ICD-10-CM

## 2015-07-10 DIAGNOSIS — R52 Pain, unspecified: Secondary | ICD-10-CM

## 2015-07-10 DIAGNOSIS — M79661 Pain in right lower leg: Secondary | ICD-10-CM | POA: Diagnosis not present

## 2015-07-24 DIAGNOSIS — M6281 Muscle weakness (generalized): Secondary | ICD-10-CM | POA: Diagnosis not present

## 2015-07-24 DIAGNOSIS — E041 Nontoxic single thyroid nodule: Secondary | ICD-10-CM | POA: Diagnosis not present

## 2015-07-24 DIAGNOSIS — G959 Disease of spinal cord, unspecified: Secondary | ICD-10-CM | POA: Diagnosis not present

## 2015-07-24 DIAGNOSIS — N390 Urinary tract infection, site not specified: Secondary | ICD-10-CM | POA: Diagnosis not present

## 2015-07-24 DIAGNOSIS — R5382 Chronic fatigue, unspecified: Secondary | ICD-10-CM | POA: Diagnosis not present

## 2015-07-25 DIAGNOSIS — M546 Pain in thoracic spine: Secondary | ICD-10-CM | POA: Diagnosis not present

## 2015-07-25 DIAGNOSIS — M5412 Radiculopathy, cervical region: Secondary | ICD-10-CM | POA: Diagnosis not present

## 2015-08-01 DIAGNOSIS — G959 Disease of spinal cord, unspecified: Secondary | ICD-10-CM | POA: Diagnosis not present

## 2015-08-01 DIAGNOSIS — R5382 Chronic fatigue, unspecified: Secondary | ICD-10-CM | POA: Diagnosis not present

## 2015-08-01 DIAGNOSIS — M5136 Other intervertebral disc degeneration, lumbar region: Secondary | ICD-10-CM | POA: Diagnosis not present

## 2015-08-01 DIAGNOSIS — M6281 Muscle weakness (generalized): Secondary | ICD-10-CM | POA: Diagnosis not present

## 2015-08-12 DIAGNOSIS — M4802 Spinal stenosis, cervical region: Secondary | ICD-10-CM | POA: Diagnosis not present

## 2015-08-12 DIAGNOSIS — M5412 Radiculopathy, cervical region: Secondary | ICD-10-CM | POA: Diagnosis not present

## 2015-08-20 DIAGNOSIS — M546 Pain in thoracic spine: Secondary | ICD-10-CM | POA: Diagnosis not present

## 2015-08-20 DIAGNOSIS — Z79899 Other long term (current) drug therapy: Secondary | ICD-10-CM | POA: Diagnosis not present

## 2015-08-20 DIAGNOSIS — R531 Weakness: Secondary | ICD-10-CM | POA: Diagnosis not present

## 2015-08-20 DIAGNOSIS — Z981 Arthrodesis status: Secondary | ICD-10-CM | POA: Diagnosis not present

## 2015-08-28 DIAGNOSIS — Z1212 Encounter for screening for malignant neoplasm of rectum: Secondary | ICD-10-CM | POA: Diagnosis not present

## 2015-08-28 DIAGNOSIS — Z1211 Encounter for screening for malignant neoplasm of colon: Secondary | ICD-10-CM | POA: Diagnosis not present

## 2015-09-12 DIAGNOSIS — S12400A Unspecified displaced fracture of fifth cervical vertebra, initial encounter for closed fracture: Secondary | ICD-10-CM | POA: Diagnosis not present

## 2015-09-12 DIAGNOSIS — M545 Low back pain: Secondary | ICD-10-CM | POA: Diagnosis not present

## 2015-09-12 DIAGNOSIS — Y33XXXA Other specified events, undetermined intent, initial encounter: Secondary | ICD-10-CM | POA: Diagnosis not present

## 2015-09-30 DIAGNOSIS — M545 Low back pain: Secondary | ICD-10-CM | POA: Diagnosis not present

## 2015-09-30 DIAGNOSIS — M5417 Radiculopathy, lumbosacral region: Secondary | ICD-10-CM | POA: Diagnosis not present

## 2015-10-14 DIAGNOSIS — M4714 Other spondylosis with myelopathy, thoracic region: Secondary | ICD-10-CM | POA: Diagnosis not present

## 2015-10-30 DIAGNOSIS — N3941 Urge incontinence: Secondary | ICD-10-CM | POA: Diagnosis not present

## 2015-10-30 DIAGNOSIS — R600 Localized edema: Secondary | ICD-10-CM | POA: Diagnosis not present

## 2015-12-19 DIAGNOSIS — M4326 Fusion of spine, lumbar region: Secondary | ICD-10-CM | POA: Diagnosis not present

## 2015-12-19 DIAGNOSIS — R2681 Unsteadiness on feet: Secondary | ICD-10-CM | POA: Diagnosis not present

## 2016-01-01 ENCOUNTER — Encounter: Payer: Self-pay | Admitting: Physical Therapy

## 2016-01-01 ENCOUNTER — Ambulatory Visit: Payer: Medicare Other | Attending: Orthopedic Surgery | Admitting: Physical Therapy

## 2016-01-01 DIAGNOSIS — R208 Other disturbances of skin sensation: Secondary | ICD-10-CM | POA: Diagnosis not present

## 2016-01-01 DIAGNOSIS — R2681 Unsteadiness on feet: Secondary | ICD-10-CM | POA: Insufficient documentation

## 2016-01-01 DIAGNOSIS — R29898 Other symptoms and signs involving the musculoskeletal system: Secondary | ICD-10-CM | POA: Insufficient documentation

## 2016-01-01 DIAGNOSIS — R2689 Other abnormalities of gait and mobility: Secondary | ICD-10-CM | POA: Diagnosis not present

## 2016-01-01 DIAGNOSIS — M6281 Muscle weakness (generalized): Secondary | ICD-10-CM | POA: Diagnosis not present

## 2016-01-01 DIAGNOSIS — R269 Unspecified abnormalities of gait and mobility: Secondary | ICD-10-CM | POA: Insufficient documentation

## 2016-01-01 DIAGNOSIS — M545 Low back pain: Secondary | ICD-10-CM | POA: Insufficient documentation

## 2016-01-01 DIAGNOSIS — M2560 Stiffness of unspecified joint, not elsewhere classified: Secondary | ICD-10-CM | POA: Insufficient documentation

## 2016-01-01 NOTE — Therapy (Signed)
[facility], [place], [address] Phone: [phone]   Fax:  [phone]  Physical Therapy Evaluation  Patient Details  Name: [name] MRN: [id number] Date of Birth: 07/03/1945 Referring Provider: Luster Landsberg PA-C  Encounter Date: 01/01/2016      PT End of Session - 01/01/16 1243    Visit Number 1   Number of Visits 17   Date for PT Re-Evaluation 02/26/16   Authorization Type Medicare: Kx mod by 15th visit, Progress note by 10th visit.    PT Start Time 1157  pt arrived 12 minutes late   PT Stop Time 1238   PT Time Calculation (min) 41 min   Activity Tolerance Patient tolerated treatment well   Behavior During Therapy WFL for tasks assessed/performed      Past Medical History:  Diagnosis Date  . Arthritis     Past Surgical History:  Procedure Laterality Date  . ABDOMINAL HYSTERECTOMY  1995  . BUNIONECTOMY  2013   rt foot  . COLONOSCOPY    . MUSCLE BIOPSY Left 10/03/2012   Procedure: LEFT QUADRICEP MUSCLE BIOPSY;  Surgeon: Odis Hollingshead, MD;  Location: [facility];  Service: General;  Laterality: Left;  . NECK SURGERY  2010   cerv disc fused     There were no vitals filed for this visit.       Subjective Assessment - 01/01/16 1208    Subjective pt is a 70 y.o F with hx of S/P lumbar fusion x 2 with the last one being in January of 2015. reports CC of  burning and weakness in both legs and significant weakness with numbness in the R foot. reports no falls in the last 6 months but has had aboput 3 near falls in the alst 6 months. currently uses SPC, but has been using her rollator at home some. since the last PT treatment she reports the weakness just slowly set in.    Limitations Standing;Lifting;Walking   How long can you sit comfortably? 20 min   How long can you stand comfortably? 5-10 min   How long can you walk comfortably? 5-10 min with [facility]   Diagnostic tests  MRI / x-ray, increased arthritis in the spine   Patient Stated Goals get mobility back, increase strength, increase safety    Currently in Pain? Yes   Pain Location Leg   Pain Orientation Right;Left   Pain Descriptors / Indicators Burning;Aching;Sore;Pins and needles   Pain Type Chronic pain   Pain Radiating Towards lower legs R>L in the stocking distribution   Pain Onset More than a month ago   Pain Frequency Intermittent   Aggravating Factors  prolonged standing/ walking   Pain Relieving Factors nothing,             [facility] PT Assessment - 01/01/16 0001      Assessment   Medical Diagnosis S/P lumbar fusion, gait instability   Referring Provider Maryclare Labrador Burne PA-C   Onset Date/Surgical Date --  couple of years   Hand Dominance Right   Next MD Visit 2 months   Prior Therapy yes     Precautions   Precautions None     Restrictions   Weight Bearing Restrictions No     Balance Screen   Has the patient fallen in the past 6 months No  but 3 near falls   Has the patient had a decrease in activity level because of a fear of  falling?  No   Is the patient reluctant to leave their home because of a fear of falling?  No     Home Ecologist residence   Living Arrangements Spouse/significant other   Available Help at Discharge Available 24 hours/day   Type of [place] to enter   Entrance Stairs-Number of Steps 1   Entrance Stairs-Rails None   Home Layout One level   [place] - 2 wheels;Cane - single point     Prior Function   Level of Independence Independent with basic ADLs;Requires assistive device for independence     Cognition   Overall Cognitive Status Within Functional Limits for tasks assessed     Observation/Other Assessments   Focus on Therapeutic Outcomes (FOTO)  69% limited  predicted 55% limited     Sensation   Light Touch Impaired Detail   Light Touch Impaired Details Impaired RLE    Additional Comments decreased dermatomal sensation compared bil in the RLE form L1 - s2.   report of tingling in the L5 dermotome onthe LLE     ROM / Strength   AROM / PROM / Strength AROM;Strength     AROM   AROM Assessment Site Lumbar   Lumbar Flexion 8   Lumbar Extension 20  with chair in front for safety   Lumbar - Right Side Bend 10   Lumbar - Left Side Bend 6     Strength   Strength Assessment Site Hip;Knee   Right/Left Hip Left;Right   Right Hip Flexion 2/5   Right Hip Extension 2-/5   Right Hip ABduction 2/5   Right Hip ADduction 2/5   Left Hip Flexion 4-/5   Left Hip Extension 3-/5   Left Hip ABduction 2/5   Left Hip ADduction 2/5   Right Knee Flexion 2/5   Right Knee Extension 3/5   Left Knee Flexion 3/5   Left Knee Extension 3+/5     Palpation   Palpation comment tightness in the L distal quadricep/ gastroc/ soleus     Ambulation/Gait   Gait Pattern Step-to pattern;Decreased stride length;Decreased stance time - right;Decreased stance time - left;Decreased arm swing - left;Trendelenburg;Antalgic;Lateral hip instability;Decreased trunk rotation;Trunk flexed;Narrow base of support;Shuffle;Left steppage                           PT Education - 01/01/16 1242    Education provided Yes   Education Details evaluation findings, POC, goals, HEP wiht proper form and reationale, cues to take time with walking and proper use of SPC to avoid over reaching    Person(s) Educated Patient   Methods Explanation;Verbal cues;Handout   Comprehension Verbalized understanding;Verbal cues required          PT Short Term Goals - 01/01/16 1257      PT SHORT TERM GOAL #1   Title pt will be I with basic HEP (02/01/2016)   Baseline *   Time 4   Period Weeks   Status New     PT SHORT TERM GOAL #2   Title pt will be able to verbalize and demo techniques for proper DME use and gait mechanics to promote safety (02/01/2016)   Baseline *   Time 4   Period  Weeks   Status New     PT SHORT TERM GOAL #3   Title she will increase trunk mobility by >/= 5 degrees with trunk flexion/  side bending to promote funcitonal mobility (02/01/2016)   Baseline *   Time 4   Period Weeks   Status New     PT SHORT TERM GOAL #4   Title she will improve bil hip exetensor / abductor strength to >/= 2+/5 on the R and 3/5 on the L for extension and >/= 2+/5 for abduction bil to promote functional strength (02/01/2016)   Baseline *   Time 4   Period Weeks   Status New     PT SHORT TERM GOAL #5   Title berg balance will be assessed and goals will be made based on assessment   Baseline *   Time 2   Period Weeks   Status New           PT Long Term Goals - 01/01/16 1302      PT LONG TERM GOAL #1   Title pt will be I with all HEP given as of last visit (02/26/2016)   Baseline *   Time 8   Period Weeks   Status New     PT LONG TERM GOAL #2   Title increase trunk flexion by >/= 10 degrees and side bending by >/= 8 degrees without pt reporting pain or fear of falling or feeling unstable for functional mobility reqired for ADLs (02/26/2016)   Baseline *   Time 8   Period Weeks   Status New     PT LONG TERM GOAL #3   Title pt will increase bil hip strength globally to >/= 4-/5 with </= 2/10 pain / burning for strength required for safety during walking/ standing activities (02/26/2016)   Baseline *   Time 8   Period Weeks   Status New     PT LONG TERM GOAL #4   Title she will be able to walk/ standing for >/= 30 min with </= 2/10 pain with LRAD for functional endurance required for ADLS ([id number])   Baseline *   Time 8   Period Weeks   Status New     PT LONG TERM GOAL #5   Title she will increase her FOTO score to >/= 55% limitation to demonstrate improvement in function at discharge (02/26/2016)   Baseline *   Time 8   Period Weeks   Status New               Plan - 01/01/16 1244    Clinical Impression Statement Mrs. Joswick presents  as a low complexity evaluation base on PMhx. CC of bil LE weakness with burning/ pain and numbness / tingling in the stocking distribution bil. She demonstrates significant limtiation in trunk mobility, and weakness in bil LE. pt tripped twice arriving and leaving from PT requiring CGA for safety and verbal cues to slow down. she has demonstrated siginificant regression compared to when she was last seen and discharged back in [place] of 2016. She would benefit from physical therapy to decrease LE pan, improve mobility, improve strength/ stability and safety to maximize her function by addressing the deficits listed.    Rehab Potential Good   PT Frequency 3x / week   PT Duration 8 weeks   PT Treatment/Interventions ADLs/Self Care Home Management;Cryotherapy;Electrical Stimulation;Iontophoresis 4mg /ml Dexamethasone;Ultrasound;Gait training;Stair training;Functional mobility training;Therapeutic activities;Therapeutic exercise;Balance training;Neuromuscular re-education;Patient/family education;Passive range of motion;Taping;Manual techniques   PT Next Visit Plan assess/ review HEP, hip strengthening, Berg balance, proper use of DME   PT Home Exercise Plan sidelying clam shell, seated calf raise, glute squeeze, SAQ  Consulted and Agree with Plan of Care Patient      Patient will benefit from skilled therapeutic intervention in order to improve the following deficits and impairments:  Abnormal gait, Decreased endurance, Decreased activity tolerance, Decreased balance, Pain, Improper body mechanics, Postural dysfunction, Impaired sensation, Decreased strength, Hypomobility, Difficulty walking, Increased fascial restricitons, Decreased range of motion, Decreased knowledge of use of DME  Visit Diagnosis: Muscle weakness (generalized) - Plan: PT plan of care cert/re-cert  Other abnormalities of gait and mobility - Plan: PT plan of care cert/re-cert  Unsteadiness on feet - Plan: PT plan of care  cert/re-cert  Other disturbances of skin sensation - Plan: PT plan of care cert/re-cert      G-Codes - [id number]    Functional Assessment Tool Used FOTO/ clinical judgement   Functional Limitation Mobility: Walking and moving around   Mobility: Walking and Moving Around Current Status [id number]) At least 60 percent but less than 80 percent impaired, limited or restricted   Mobility: Walking and Moving Around Goal Status [phone]) At least 40 percent but less than 60 percent impaired, limited or restricted       Problem List Patient Active Problem List   Diagnosis Date Noted  . Weakness 11/03/2012   Starr Lake PT, DPT, LAT, ATC  01/01/16  1:08 PM      [facility] [address] [place], [place], [address] Phone: [phone]   Fax:  [phone]  Name: YEILY KOCUR MRN: [id number] Date of Birth: 09-27-1945

## 2016-01-06 ENCOUNTER — Ambulatory Visit: Payer: Medicare Other | Admitting: Physical Therapy

## 2016-01-06 DIAGNOSIS — R2681 Unsteadiness on feet: Secondary | ICD-10-CM

## 2016-01-06 DIAGNOSIS — R29898 Other symptoms and signs involving the musculoskeletal system: Secondary | ICD-10-CM

## 2016-01-06 DIAGNOSIS — R208 Other disturbances of skin sensation: Secondary | ICD-10-CM | POA: Diagnosis not present

## 2016-01-06 DIAGNOSIS — R2689 Other abnormalities of gait and mobility: Secondary | ICD-10-CM

## 2016-01-06 DIAGNOSIS — M6281 Muscle weakness (generalized): Secondary | ICD-10-CM

## 2016-01-06 DIAGNOSIS — R269 Unspecified abnormalities of gait and mobility: Secondary | ICD-10-CM | POA: Diagnosis not present

## 2016-01-06 NOTE — Therapy (Signed)
[place], [place], [address] Phone: [phone]   Fax:  [phone]  Physical Therapy Treatment  Patient Details  Name: [name] MRN: [id number] Date of Birth: 03-17-1946 Referring Provider: Luster Landsberg PA-C  Encounter Date: 01/06/2016      PT End of Session - 01/06/16 1144    Visit Number 2   Number of Visits 17   Date for PT Re-Evaluation 02/26/16   Authorization Type Medicare: Kx mod by 15th visit, Progress note by 10th visit.    PT Start Time 1100   PT Stop Time 1145   PT Time Calculation (min) 45 min      Past Medical History:  Diagnosis Date  . Arthritis     Past Surgical History:  Procedure Laterality Date  . ABDOMINAL HYSTERECTOMY  1995  . BUNIONECTOMY  2013   rt foot  . COLONOSCOPY    . MUSCLE BIOPSY Left 10/03/2012   Procedure: LEFT QUADRICEP MUSCLE BIOPSY;  Surgeon: Odis Hollingshead, MD;  Location: [facility];  Service: General;  Laterality: Left;  . NECK SURGERY  2010   cerv disc fused     There were no vitals filed for this visit.      Subjective Assessment - 01/06/16 1109    Subjective I am not really in pain, just burning and stinging in lower legs, more on the left.             [facility] PT Assessment - 01/06/16 0001      Standardized Balance Assessment   Standardized Balance Assessment Berg Balance Test     Berg Balance Test   Sit to Stand Able to stand without using hands and stabilize independently   Standing Unsupported Able to stand safely 2 minutes   Sitting with Back Unsupported but Feet Supported on Floor or Stool Able to sit safely and securely 2 minutes   Stand to Sit Sits safely with minimal use of hands   Transfers Able to transfer safely, definite need of hands   Standing Unsupported with Eyes Closed Able to stand 10 seconds safely   Standing Ubsupported with Feet Together Able to place feet together independently and stand  for 1 minute with supervision   From Standing, Reach Forward with Outstretched Arm Can reach forward >5 cm safely (2")   From Standing Position, Pick up Object from [place] to pick up shoe, needs supervision   From Standing Position, Turn to Look Behind Over each Shoulder Looks behind one side only/other side shows less weight shift   Turn 360 Degrees Able to turn 360 degrees safely but slowly   Standing Unsupported, Alternately Place Feet on Step/Stool Able to complete >2 steps/needs minimal assist   Standing Unsupported, One Foot in Front Able to take small step independently and hold 30 seconds   Standing on One Leg Able to lift leg independently and hold equal to or more than 3 seconds   Total Score 41   Berg comment: > 80% fall risk                      [facility] Adult PT Treatment/Exercise - 01/06/16 0001      Lumbar Exercises: Seated   Long Arc Quad on Chair Right;20 reps   Other Seated Lumbar Exercises Seated March 10  x2 bilateral with ab brace   Other Seated Lumbar Exercises Heated heel raises, toe raises x 20 each  Lumbar Exercises: Supine   Clam 20 reps   Clam Limitations red     Lumbar Exercises: Sidelying   Clam 20 reps   Clam Limitations left and reverse x 10                 PT Education - 01/06/16 1208    Education provided Yes   Education Details Supine Clam   Person(s) Educated Patient   Methods Handout;Explanation   Comprehension Verbalized understanding          PT Short Term Goals - 01/01/16 1257      PT SHORT TERM GOAL #1   Title pt will be I with basic HEP (02/01/2016)   Baseline *   Time 4   Period Weeks   Status New     PT SHORT TERM GOAL #2   Title pt will be able to verbalize and demo techniques for proper DME use and gait mechanics to promote safety (02/01/2016)   Baseline *   Time 4   Period Weeks   Status New     PT SHORT TERM GOAL #3   Title she will increase trunk mobility by >/= 5 degrees with trunk  flexion/ side bending to promote funcitonal mobility (02/01/2016)   Baseline *   Time 4   Period Weeks   Status New     PT SHORT TERM GOAL #4   Title she will improve bil hip exetensor / abductor strength to >/= 2+/5 on the R and 3/5 on the L for extension and >/= 2+/5 for abduction bil to promote functional strength (02/01/2016)   Baseline *   Time 4   Period Weeks   Status New     PT SHORT TERM GOAL #5   Title berg balance will be assessed and goals will be made based on assessment   Baseline *   Time 2   Period Weeks   Status New           PT Long Term Goals - 01/01/16 1302      PT LONG TERM GOAL #1   Title pt will be I with all HEP given as of last visit (02/26/2016)   Baseline *   Time 8   Period Weeks   Status New     PT LONG TERM GOAL #2   Title increase trunk flexion by >/= 10 degrees and side bending by >/= 8 degrees without pt reporting pain or fear of falling or feeling unstable for functional mobility reqired for ADLs (02/26/2016)   Baseline *   Time 8   Period Weeks   Status New     PT LONG TERM GOAL #3   Title pt will increase bil hip strength globally to >/= 4-/5 with </= 2/10 pain / burning for strength required for safety during walking/ standing activities (02/26/2016)   Baseline *   Time 8   Period Weeks   Status New     PT LONG TERM GOAL #4   Title she will be able to walk/ standing for >/= 30 min with </= 2/10 pain with LRAD for functional endurance required for ADLS ([id number])   Baseline *   Time 8   Period Weeks   Status New     PT LONG TERM GOAL #5   Title she will increase her FOTO score to >/= 55% limitation to demonstrate improvement in function at discharge (02/26/2016)   Baseline *   Time 8   Period Weeks  Status New               Plan - 01/06/16 1127    Clinical Impression Statement BERG 41/56. Pt reprots he has an AFO however has not been wearing it. Asked her to bring it next visit. Focused hip/knee strengthening  and review of HEP. No increased pain.    PT Next Visit Plan assess/ review HEP, hip strengthening,  proper use of DME   PT Home Exercise Plan sidelying clam shell, seated calf raise, glute squeeze, SAQ, supine clam with red band   Consulted and Agree with Plan of Care Patient      Patient will benefit from skilled therapeutic intervention in order to improve the following deficits and impairments:  Abnormal gait, Decreased endurance, Decreased activity tolerance, Decreased balance, Pain, Improper body mechanics, Postural dysfunction, Impaired sensation, Decreased strength, Hypomobility, Difficulty walking, Increased fascial restricitons, Decreased range of motion, Decreased knowledge of use of DME  Visit Diagnosis: Muscle weakness (generalized)  Other abnormalities of gait and mobility  Unsteadiness on feet  Other disturbances of skin sensation  Weakness of both legs     Problem List Patient Active Problem List   Diagnosis Date Noted  . Weakness 11/03/2012    Dorene Ar, PTA 01/06/2016, 12:30 PM  [place], [place], [address] Phone: [phone]   Fax:  [phone]  Name: [name] MRN: [id number] Date of Birth: Oct 08, 1945

## 2016-01-06 NOTE — Patient Instructions (Signed)
Supine Clam shell with Red band around knees hold 5 seconds, Repeat 20 times. 1 Set. 2 times per day

## 2016-01-08 ENCOUNTER — Ambulatory Visit: Payer: Medicare Other | Admitting: Physical Therapy

## 2016-01-08 DIAGNOSIS — R2689 Other abnormalities of gait and mobility: Secondary | ICD-10-CM | POA: Diagnosis not present

## 2016-01-08 DIAGNOSIS — R208 Other disturbances of skin sensation: Secondary | ICD-10-CM | POA: Diagnosis not present

## 2016-01-08 DIAGNOSIS — R269 Unspecified abnormalities of gait and mobility: Secondary | ICD-10-CM | POA: Diagnosis not present

## 2016-01-08 DIAGNOSIS — R2681 Unsteadiness on feet: Secondary | ICD-10-CM | POA: Diagnosis not present

## 2016-01-08 DIAGNOSIS — R29898 Other symptoms and signs involving the musculoskeletal system: Secondary | ICD-10-CM | POA: Diagnosis not present

## 2016-01-08 DIAGNOSIS — M6281 Muscle weakness (generalized): Secondary | ICD-10-CM | POA: Diagnosis not present

## 2016-01-08 NOTE — Therapy (Signed)
[place], [place], [address] Phone: [phone]   Fax:  [phone]  Physical Therapy Treatment  Patient Details  Name: [name] MRN: [id number] Date of Birth: 04-01-1945 Referring Provider: Luster Landsberg PA-C  Encounter Date: 01/08/2016      PT End of Session - 01/08/16 1232    Visit Number 3   Number of Visits 17   Date for PT Re-Evaluation 02/26/16   Authorization Type Medicare: Kx mod by 15th visit, Progress note by 10th visit.    PT Start Time 1147   PT Stop Time 1230   PT Time Calculation (min) 43 min   Activity Tolerance Patient tolerated treatment well   Behavior During Therapy WFL for tasks assessed/performed      Past Medical History:  Diagnosis Date  . Arthritis     Past Surgical History:  Procedure Laterality Date  . ABDOMINAL HYSTERECTOMY  1995  . BUNIONECTOMY  2013   rt foot  . COLONOSCOPY    . MUSCLE BIOPSY Left 10/03/2012   Procedure: LEFT QUADRICEP MUSCLE BIOPSY;  Surgeon: Odis Hollingshead, MD;  Location: [facility];  Service: General;  Laterality: Left;  . NECK SURGERY  2010   cerv disc fused     There were no vitals filed for this visit.      Subjective Assessment - 01/08/16 1147    Subjective "I am    Currently in Pain? Yes   Pain Score 0-No pain   Pain Location Leg   Pain Orientation Right;Left   Pain Descriptors / Indicators Burning;Aching;Sore;Pins and needles   Pain Onset More than a month ago   Pain Frequency Constant   Aggravating Factors  prolonged standing/ walking   Pain Relieving Factors nothing,                          [facility] Adult PT Treatment/Exercise - 01/08/16 0001      Lumbar Exercises: Aerobic   Stationary Bike Nu-step L 5 x 8 min (verbal cues to keep R knee from adducting)  UE/LE for 5 min, and only LE for 3 min     Lumbar Exercises: Seated   Other Seated Lumbar Exercises seated PF/ DF 3 x 10  each,     Lumbar Exercises: Sidelying   Clam 20 reps  x 2 sets     Knee/Hip Exercises: Supine   Bridges Strengthening;Both;2 sets;10 reps   Other Supine Knee/Hip Exercises hip flexion with R foot on the phyisoball 2 x 12  with manual resistance                  PT Short Term Goals - 01/01/16 1257      PT SHORT TERM GOAL #1   Title pt will be I with basic HEP (02/01/2016)   Baseline *   Time 4   Period Weeks   Status New     PT SHORT TERM GOAL #2   Title pt will be able to verbalize and demo techniques for proper DME use and gait mechanics to promote safety (02/01/2016)   Baseline *   Time 4   Period Weeks   Status New     PT SHORT TERM GOAL #3   Title she will increase trunk mobility by >/= 5 degrees with trunk flexion/ side bending to promote funcitonal mobility (02/01/2016)   Baseline *   Time 4   Period Weeks  Status New     PT SHORT TERM GOAL #4   Title she will improve bil hip exetensor / abductor strength to >/= 2+/5 on the R and 3/5 on the L for extension and >/= 2+/5 for abduction bil to promote functional strength (02/01/2016)   Baseline *   Time 4   Period Weeks   Status New     PT SHORT TERM GOAL #5   Title berg balance will be assessed and goals will be made based on assessment   Baseline *   Time 2   Period Weeks   Status New           PT Long Term Goals - 01/01/16 1302      PT LONG TERM GOAL #1   Title pt will be I with all HEP given as of last visit (02/26/2016)   Baseline *   Time 8   Period Weeks   Status New     PT LONG TERM GOAL #2   Title increase trunk flexion by >/= 10 degrees and side bending by >/= 8 degrees without pt reporting pain or fear of falling or feeling unstable for functional mobility reqired for ADLs (02/26/2016)   Baseline *   Time 8   Period Weeks   Status New     PT LONG TERM GOAL #3   Title pt will increase bil hip strength globally to >/= 4-/5 with </= 2/10 pain / burning for strength required  for safety during walking/ standing activities (02/26/2016)   Baseline *   Time 8   Period Weeks   Status New     PT LONG TERM GOAL #4   Title she will be able to walk/ standing for >/= 30 min with </= 2/10 pain with LRAD for functional endurance required for ADLS ([id number])   Baseline *   Time 8   Period Weeks   Status New     PT LONG TERM GOAL #5   Title she will increase her FOTO score to >/= 55% limitation to demonstrate improvement in function at discharge (02/26/2016)   Baseline *   Time 8   Period Weeks   Status New               Plan - 01/08/16 1233    Clinical Impression Statement [name] continues to verbalize frustration as to why she is so weak. Continues to focus on hip/ knee strengthening in supine for abduction and hip flexion. she forgot to bring her AFO and rpeorted she couldn't find it.    PT Next Visit Plan assess/ review HEP, hip strengthening,  proper use of DME   Consulted and Agree with Plan of Care Patient      Patient will benefit from skilled therapeutic intervention in order to improve the following deficits and impairments:  Abnormal gait, Decreased endurance, Decreased activity tolerance, Decreased balance, Pain, Improper body mechanics, Postural dysfunction, Impaired sensation, Decreased strength, Hypomobility, Difficulty walking, Increased fascial restricitons, Decreased range of motion, Decreased knowledge of use of DME  Visit Diagnosis: Muscle weakness (generalized)  Other abnormalities of gait and mobility  Unsteadiness on feet  Other disturbances of skin sensation     Problem List Patient Active Problem List   Diagnosis Date Noted  . Weakness 11/03/2012   Starr Lake PT, DPT, LAT, ATC  01/08/16  12:37 PM      [facility] [address] [place], [place], [address] Phone: [phone]  Fax:  [phone]  Name: [name] MRN: [id number] Date of Birth:  [birth year]

## 2016-01-09 ENCOUNTER — Ambulatory Visit: Payer: Medicare Other | Admitting: Physical Therapy

## 2016-01-09 DIAGNOSIS — M545 Low back pain, unspecified: Secondary | ICD-10-CM

## 2016-01-09 DIAGNOSIS — R29898 Other symptoms and signs involving the musculoskeletal system: Secondary | ICD-10-CM

## 2016-01-09 DIAGNOSIS — M2560 Stiffness of unspecified joint, not elsewhere classified: Secondary | ICD-10-CM

## 2016-01-09 DIAGNOSIS — R208 Other disturbances of skin sensation: Secondary | ICD-10-CM

## 2016-01-09 DIAGNOSIS — R269 Unspecified abnormalities of gait and mobility: Secondary | ICD-10-CM

## 2016-01-09 DIAGNOSIS — R2689 Other abnormalities of gait and mobility: Secondary | ICD-10-CM | POA: Diagnosis not present

## 2016-01-09 DIAGNOSIS — M6281 Muscle weakness (generalized): Secondary | ICD-10-CM | POA: Diagnosis not present

## 2016-01-09 DIAGNOSIS — R2681 Unsteadiness on feet: Secondary | ICD-10-CM

## 2016-01-09 DIAGNOSIS — M5386 Other specified dorsopathies, lumbar region: Secondary | ICD-10-CM

## 2016-01-09 NOTE — Therapy (Signed)
[place], [place], [address] Phone: [phone]   Fax:  [phone]  Physical Therapy Treatment  Patient Details  Name: [name] MRN: [id number] Date of Birth: 09/18/1945 Referring Provider: Luster Landsberg PA-C  Encounter Date: 01/09/2016      PT End of Session - 01/09/16 1607    Visit Number 4   Number of Visits 17   Date for PT Re-Evaluation 02/26/16   PT Start Time 1419   PT Stop Time 1500   PT Time Calculation (min) 41 min   Activity Tolerance Patient tolerated treatment well   Behavior During Therapy [facility] for tasks assessed/performed      Past Medical History:  Diagnosis Date  . Arthritis     Past Surgical History:  Procedure Laterality Date  . ABDOMINAL HYSTERECTOMY  1995  . BUNIONECTOMY  2013   rt foot  . COLONOSCOPY    . MUSCLE BIOPSY Left 10/03/2012   Procedure: LEFT QUADRICEP MUSCLE BIOPSY;  Surgeon: Odis Hollingshead, MD;  Location: [place];  Service: General;  Laterality: Left;  . NECK SURGERY  2010   cerv disc fused     There were no vitals filed for this visit.      Subjective Assessment - 01/09/16 1424    Subjective I am wearing my brace for the first time today,  I am trying to get uesd to it.  NO pain   Currently in Pain? No/denies   Pain Location Knee   Pain Orientation Left   Pain Descriptors / Indicators Burning  pins, needles.   Pain Radiating Towards legs,  lateral lower   Pain Frequency Constant   Aggravating Factors  getting upset about something.   Pain Relieving Factors nothing                         [facility] Adult PT Treatment/Exercise - 01/09/16 0001      Lumbar Exercises: Aerobic   Stationary Bike Nustep L 5 , 6 minutes arms, legs     Lumbar Exercises: Seated   Other Seated Lumbar Exercises hip flexion with good posture.      Lumbar Exercises: Supine   Ab Set 10 reps  tilt   Bent Knee Raise 10 reps  AA  RT, with tilt   Bridge 10 reps   Bridge Limitations small lift off table   Other Supine Lumbar Exercises tilt with ball squeeze and pelvic floor 10 x Cues     Lumbar Exercises: Sidelying   Clam 20 reps  x 2 sets red band     Knee/Hip Exercises: Standing   Other Standing Knee Exercises Wall slides facing wall for pre gait .  then 15 x with hip flexion.  HEP,  CGA with hip flexion.  will do with husband     Knee/Hip Exercises: Supine   Writer;Both   Bridges Limitations 10                PT Education - 01/09/16 1607    Education provided Yes   Education Details exercise   Person(s) Educated Patient   Methods Explanation;Demonstration;Tactile cues;Verbal cues;Handout   Comprehension Verbalized understanding;Returned demonstration;Need further instruction          PT Short Term Goals - 01/09/16 1611      PT SHORT TERM GOAL #1   Title pt will be I with basic HEP (02/01/2016)   Baseline cues  Time 4   Period Weeks   Status On-going     PT SHORT TERM GOAL #2   Title pt will be able to verbalize and demo techniques for proper DME use and gait mechanics to promote safety (02/01/2016)   Baseline discussed, AFO, able to wear today   Time 4   Period Weeks   Status On-going     PT SHORT TERM GOAL #3   Title she will increase trunk mobility by >/= 5 degrees with trunk flexion/ side bending to promote funcitonal mobility (02/01/2016)   Time 4   Period Weeks   Status Unable to assess     PT SHORT TERM GOAL #4   Title she will improve bil hip exetensor / abductor strength to >/= 2+/5 on the R and 3/5 on the L for extension and >/= 2+/5 for abduction bil to promote functional strength (02/01/2016)   Time 4   Period Weeks   Status Unable to assess     PT SHORT TERM GOAL #5   Title berg balance will be assessed and goals will be made based on assessment   Time 2   Period Weeks   Status Unable to assess           PT Long Term Goals - 01/01/16  1302      PT LONG TERM GOAL #1   Title pt will be I with all HEP given as of last visit (02/26/2016)   Baseline *   Time 8   Period Weeks   Status New     PT LONG TERM GOAL #2   Title increase trunk flexion by >/= 10 degrees and side bending by >/= 8 degrees without pt reporting pain or fear of falling or feeling unstable for functional mobility reqired for ADLs (02/26/2016)   Baseline *   Time 8   Period Weeks   Status New     PT LONG TERM GOAL #3   Title pt will increase bil hip strength globally to >/= 4-/5 with </= 2/10 pain / burning for strength required for safety during walking/ standing activities (02/26/2016)   Baseline *   Time 8   Period Weeks   Status New     PT LONG TERM GOAL #4   Title she will be able to walk/ standing for >/= 30 min with </= 2/10 pain with LRAD for functional endurance required for ADLS ([id number])   Baseline *   Time 8   Period Weeks   Status New     PT LONG TERM GOAL #5   Title she will increase her FOTO score to >/= 55% limitation to demonstrate improvement in function at discharge (02/26/2016)   Baseline *   Time 8   Period Weeks   Status New               Plan - 01/09/16 1607    Clinical Impression Statement [name] was wearing AFO today with gait improvements.  She was able to lift Right hip in standing with wall slide exercises.  No pain post session however she felt "The Burn" in her right quads with standing exercises.    PT Next Visit Plan , hip strengthening,  proper use of DME   PT Home Exercise Plan sidelying clam shell, seated calf raise, glute squeeze, SAQ, supine clam with red band( 01/09/2016) standing facing wall slides weight shifts and with hip flexion.    Consulted and Agree with Plan of Care Patient  Patient will benefit from skilled therapeutic intervention in order to improve the following deficits and impairments:  Abnormal gait, Decreased endurance, Decreased activity tolerance, Decreased balance,  Pain, Improper body mechanics, Postural dysfunction, Impaired sensation, Decreased strength, Hypomobility, Difficulty walking, Increased fascial restricitons, Decreased range of motion, Decreased knowledge of use of DME  Visit Diagnosis: Muscle weakness (generalized)  Other abnormalities of gait and mobility  Unsteadiness on feet  Other disturbances of skin sensation  Weakness of both legs  Abnormality of gait  Unsteadiness  Joint stiffness of spine  Decreased ROM of lumbar spine  Bilateral low back pain without sciatica, unspecified chronicity     Problem List Patient Active Problem List   Diagnosis Date Noted  . Weakness 11/03/2012    Conley Delisle PTA 01/09/2016, 4:13 PM  [facility] [address] [place], [place], [address] Phone: [phone]   Fax:  [phone]  Name: LODENA MARSICO MRN: [id number] Date of Birth: October 08, 1945

## 2016-01-09 NOTE — Patient Instructions (Signed)
Wall slides, facing wall with weight shifting forward,  Also with hip flexion CGA by husband for safety.  Issued from exercise drawer up to 30 X daily.

## 2016-01-13 ENCOUNTER — Ambulatory Visit: Payer: Medicare Other | Admitting: Physical Therapy

## 2016-01-13 DIAGNOSIS — R269 Unspecified abnormalities of gait and mobility: Secondary | ICD-10-CM

## 2016-01-13 DIAGNOSIS — M6281 Muscle weakness (generalized): Secondary | ICD-10-CM | POA: Diagnosis not present

## 2016-01-13 DIAGNOSIS — R208 Other disturbances of skin sensation: Secondary | ICD-10-CM | POA: Diagnosis not present

## 2016-01-13 DIAGNOSIS — R2689 Other abnormalities of gait and mobility: Secondary | ICD-10-CM | POA: Diagnosis not present

## 2016-01-13 DIAGNOSIS — R2681 Unsteadiness on feet: Secondary | ICD-10-CM

## 2016-01-13 DIAGNOSIS — R29898 Other symptoms and signs involving the musculoskeletal system: Secondary | ICD-10-CM | POA: Diagnosis not present

## 2016-01-13 NOTE — Therapy (Signed)
[place], [place], [address] Phone: [phone]   Fax:  [phone]  Physical Therapy Treatment  Patient Details  Name: [name] MRN: [id number] Date of Birth: Jul 29, 1945 Referring Provider: Luster Landsberg PA-C  Encounter Date: 01/13/2016      PT End of Session - 01/13/16 1148    Visit Number 5   Number of Visits 17   Date for PT Re-Evaluation 02/26/16   Authorization Type Medicare: Kx mod by 15th visit, Progress note by 10th visit.    PT Start Time 1148   PT Stop Time 1230   PT Time Calculation (min) 42 min      Past Medical History:  Diagnosis Date  . Arthritis     Past Surgical History:  Procedure Laterality Date  . ABDOMINAL HYSTERECTOMY  1995  . BUNIONECTOMY  2013   rt foot  . COLONOSCOPY    . MUSCLE BIOPSY Left 10/03/2012   Procedure: LEFT QUADRICEP MUSCLE BIOPSY;  Surgeon: Odis Hollingshead, MD;  Location: [place];  Service: General;  Laterality: Left;  . NECK SURGERY  2010   cerv disc fused     There were no vitals filed for this visit.      Subjective Assessment - 01/13/16 1228    Subjective It's a blue monday   Currently in Pain? No/denies                         [facility] Adult PT Treatment/Exercise - 01/13/16 0001      Lumbar Exercises: Aerobic   Stationary Bike Nustep L 5 , 6 minutes, legs only      Lumbar Exercises: Seated   Long Arc Quad on Chair Right;20 reps     Lumbar Exercises: Supine   Ab Set 10 reps  tilt   Bridge 10 reps   Bridge Limitations small lift off table   Other Supine Lumbar Exercises tilt with ball squeeze and pelvic floor 10 x Cues     Lumbar Exercises: Sidelying   Clam 20 reps  x 2 sets red band   Clam Limitations left/ right and reverse x 20                  PT Short Term Goals - 01/09/16 1611      PT SHORT TERM GOAL #1   Title pt will be I with basic HEP (02/01/2016)   Baseline cues    Time 4   Period Weeks   Status On-going     PT SHORT TERM GOAL #2   Title pt will be able to verbalize and demo techniques for proper DME use and gait mechanics to promote safety (02/01/2016)   Baseline discussed, AFO, able to wear today   Time 4   Period Weeks   Status On-going     PT SHORT TERM GOAL #3   Title she will increase trunk mobility by >/= 5 degrees with trunk flexion/ side bending to promote funcitonal mobility (02/01/2016)   Time 4   Period Weeks   Status Unable to assess     PT SHORT TERM GOAL #4   Title she will improve bil hip exetensor / abductor strength to >/= 2+/5 on the R and 3/5 on the L for extension and >/= 2+/5 for abduction bil to promote functional strength (02/01/2016)   Time 4   Period Weeks   Status Unable to assess  PT SHORT TERM GOAL #5   Title berg balance will be assessed and goals will be made based on assessment   Time 2   Period Weeks   Status Unable to assess           PT Long Term Goals - 01/01/16 1302      PT LONG TERM GOAL #1   Title pt will be I with all HEP given as of last visit (02/26/2016)   Baseline *   Time 8   Period Weeks   Status New     PT LONG TERM GOAL #2   Title increase trunk flexion by >/= 10 degrees and side bending by >/= 8 degrees without pt reporting pain or fear of falling or feeling unstable for functional mobility reqired for ADLs (02/26/2016)   Baseline *   Time 8   Period Weeks   Status New     PT LONG TERM GOAL #3   Title pt will increase bil hip strength globally to >/= 4-/5 with </= 2/10 pain / burning for strength required for safety during walking/ standing activities (02/26/2016)   Baseline *   Time 8   Period Weeks   Status New     PT LONG TERM GOAL #4   Title she will be able to walk/ standing for >/= 30 min with </= 2/10 pain with LRAD for functional endurance required for ADLS ([id number])   Baseline *   Time 8   Period Weeks   Status New     PT LONG TERM GOAL #5   Title she  will increase her FOTO score to >/= 55% limitation to demonstrate improvement in function at discharge (02/26/2016)   Baseline *   Time 8   Period Weeks   Status New               Plan - 01/13/16 1228    Clinical Impression Statement Pt reports she is having a bad day. Focused mat exercises for core and hip strength. Unable to SLR on right. No pain, only fatigue.    PT Next Visit Plan , hip strengthening,  proper use of DME; check goals, ROM. set BERG goal.    PT Home Exercise Plan sidelying clam shell, seated calf raise, glute squeeze, SAQ, supine clam with red band( 01/09/2016) standing facing wall slides weight shifts and with hip flexion.       Patient will benefit from skilled therapeutic intervention in order to improve the following deficits and impairments:  Abnormal gait, Decreased endurance, Decreased activity tolerance, Decreased balance, Pain, Improper body mechanics, Postural dysfunction, Impaired sensation, Decreased strength, Hypomobility, Difficulty walking, Increased fascial restricitons, Decreased range of motion, Decreased knowledge of use of DME  Visit Diagnosis: Muscle weakness (generalized)  Other abnormalities of gait and mobility  Unsteadiness on feet  Other disturbances of skin sensation  Weakness of both legs  Abnormality of gait     Problem List Patient Active Problem List   Diagnosis Date Noted  . Weakness 11/03/2012    [name], PTA 01/13/2016, 12:40 PM  [place], [place], [address] Phone: [phone]   Fax:  [phone]  Name: [name] MRN: [id number] Date of Birth: Sep 26, 1945

## 2016-01-15 ENCOUNTER — Encounter: Payer: Medicare Other | Admitting: Physical Therapy

## 2016-01-15 ENCOUNTER — Ambulatory Visit: Payer: Medicare Other | Admitting: Physical Therapy

## 2016-01-15 DIAGNOSIS — R2689 Other abnormalities of gait and mobility: Secondary | ICD-10-CM

## 2016-01-15 DIAGNOSIS — M6281 Muscle weakness (generalized): Secondary | ICD-10-CM | POA: Diagnosis not present

## 2016-01-15 DIAGNOSIS — R269 Unspecified abnormalities of gait and mobility: Secondary | ICD-10-CM | POA: Diagnosis not present

## 2016-01-15 DIAGNOSIS — R2681 Unsteadiness on feet: Secondary | ICD-10-CM | POA: Diagnosis not present

## 2016-01-15 DIAGNOSIS — R208 Other disturbances of skin sensation: Secondary | ICD-10-CM | POA: Diagnosis not present

## 2016-01-15 DIAGNOSIS — R29898 Other symptoms and signs involving the musculoskeletal system: Secondary | ICD-10-CM

## 2016-01-15 NOTE — Therapy (Signed)
[place], [place], [address] Phone: [phone]   Fax:  [phone]  Physical Therapy Treatment  Patient Details  Name: [name] MRN: [id number] Date of Birth: 06/30/1945 Referring Provider: Luster Landsberg PA-C  Encounter Date: 01/15/2016      PT End of Session - 01/15/16 1243    Visit Number 6   Number of Visits 17   Date for PT Re-Evaluation 02/26/16   Authorization Type Medicare: Kx mod by 15th visit, Progress note by 10th visit.    PT Start Time 1153  she was arrived 13 minutes late   PT Stop Time 1233   PT Time Calculation (min) 40 min   Activity Tolerance Patient tolerated treatment well   Behavior During Therapy [facility] for tasks assessed/performed      Past Medical History:  Diagnosis Date  . Arthritis     Past Surgical History:  Procedure Laterality Date  . ABDOMINAL HYSTERECTOMY  1995  . BUNIONECTOMY  2013   rt foot  . COLONOSCOPY    . MUSCLE BIOPSY Left 10/03/2012   Procedure: LEFT QUADRICEP MUSCLE BIOPSY;  Surgeon: Odis Hollingshead, MD;  Location: [place];  Service: General;  Laterality: Left;  . NECK SURGERY  2010   cerv disc fused     There were no vitals filed for this visit.      Subjective Assessment - 01/15/16 1158    Subjective "I am feeling like I have more burning today, could be because I've been doing more cleaning at home"    Currently in Pain? No/denies                         [facility] Adult PT Treatment/Exercise - 01/15/16 1159      Lumbar Exercises: Aerobic   Stationary Bike Nustep L 4 , 8 minutes, legs only      Knee/Hip Exercises: Standing   Gait Training forward/ backward walking x 5 each in //  verbal cues for proper form/ techniques   Other Standing Knee Exercises marching walking in // x 6     Manual Therapy   Manual Therapy Joint mobilization   Joint Mobilization long axis distraction grade 4               Balance Exercises - 01/15/16 1257      Balance Exercises: Standing   Standing Eyes Opened 2 reps;30 secs;Narrow base of support (BOS);Solid surface  in //    Standing Eyes Closed Narrow base of support (BOS);2 reps;30 secs;Solid surface  in //    Tandem Stance Eyes open;2 reps;30 secs  in //           PT Education - 01/15/16 1242    Education provided Yes   Education Details walking with feet farther apart to promote better balance and gait pattern.    Person(s) Educated Patient   Methods Explanation;Verbal cues   Comprehension Verbalized understanding;Verbal cues required          PT Short Term Goals - 01/15/16 1254      PT SHORT TERM GOAL #1   Title pt will be I with basic HEP (02/01/2016)   Time 4   Period Weeks   Status Achieved     PT SHORT TERM GOAL #2   Title pt will be able to verbalize and demo techniques for proper DME use and gait mechanics to promote safety (02/01/2016)   Time  4   Period Weeks   Status On-going     PT SHORT TERM GOAL #3   Title she will increase trunk mobility by >/= 5 degrees with trunk flexion/ side bending to promote funcitonal mobility (02/01/2016)   Time 4   Period Weeks   Status On-going     PT SHORT TERM GOAL #4   Title she will improve bil hip exetensor / abductor strength to >/= 2+/5 on the R and 3/5 on the L for extension and >/= 2+/5 for abduction bil to promote functional strength (02/01/2016)   Time 4   Period Weeks   Status On-going     PT SHORT TERM GOAL #5   Title berg balance will be assessed and goals will be made based on assessment   Time 2   Period Weeks   Status Achieved           PT Long Term Goals - 01/15/16 1255      PT LONG TERM GOAL #1   Title pt will be I with all HEP given as of last visit (02/26/2016)   Time 8   Period Weeks   Status On-going     PT LONG TERM GOAL #2   Title increase trunk flexion by >/= 10 degrees and side bending by >/= 8 degrees without pt reporting  pain or fear of falling or feeling unstable for functional mobility reqired for ADLs (02/26/2016)   Time 8   Period Weeks   Status On-going     PT LONG TERM GOAL #3   Title pt will increase bil hip strength globally to >/= 4-/5 with </= 2/10 pain / burning for strength required for safety during walking/ standing activities (02/26/2016)   Time 8   Period Weeks   Status On-going     PT LONG TERM GOAL #4   Title she will be able to walk/ standing for >/= 30 min with </= 2/10 pain with LRAD for functional endurance required for ADLS ([id number])   Time 8   Period Weeks   Status On-going     PT LONG TERM GOAL #5   Title she will increase her FOTO score to >/= 55% limitation to demonstrate improvement in function at discharge (02/26/2016)   Time 8   Period Weeks   Status On-going     Additional Long Term Goals   Additional Long Term Goals Yes     PT LONG TERM GOAL #6   Title she will improve her BERG balance to >/= 46 to demonstrate improvement in balance to promote safety (02/26/2016)   Time 6   Period Weeks   Status New               Plan - 01/15/16 1251    Clinical Impression Statement [name] reports no pain just burning in the leg. Following long axis distraction mobs she was able to peform SLR on the R. worked on gait training and hip hiking in the // as well as wide and narrow BOS balance in the //.    PT Next Visit Plan , hip strengthening,  proper use of DME; check goals, ROM.  long axis distraciton mobs, balance training,    PT Home Exercise Plan sidelying clam shell, seated calf raise, glute squeeze, SAQ, supine clam with red band( 01/09/2016) standing facing wall slides weight shifts and with hip flexion.    Consulted and Agree with Plan of Care Patient      Patient will benefit  from skilled therapeutic intervention in order to improve the following deficits and impairments:  Abnormal gait, Decreased endurance, Decreased activity tolerance, Decreased balance,  Pain, Improper body mechanics, Postural dysfunction, Impaired sensation, Decreased strength, Hypomobility, Difficulty walking, Increased fascial restricitons, Decreased range of motion, Decreased knowledge of use of DME  Visit Diagnosis: Muscle weakness (generalized)  Other abnormalities of gait and mobility  Unsteadiness on feet  Other disturbances of skin sensation  Weakness of both legs     Problem List Patient Active Problem List   Diagnosis Date Noted  . Weakness 11/03/2012   Starr Lake PT, DPT, LAT, ATC  01/15/16  12:59 PM      [place] [facility] [address] [place], [place], [address] Phone: [phone]   Fax:  [phone]  Name: [name] MRN: [id number] Date of Birth: 02-Feb-1946

## 2016-01-21 ENCOUNTER — Encounter: Payer: Medicare Other | Admitting: Physical Therapy

## 2016-01-22 ENCOUNTER — Ambulatory Visit: Payer: Medicare Other | Attending: Orthopedic Surgery | Admitting: Physical Therapy

## 2016-01-22 DIAGNOSIS — R29898 Other symptoms and signs involving the musculoskeletal system: Secondary | ICD-10-CM | POA: Insufficient documentation

## 2016-01-22 DIAGNOSIS — M6281 Muscle weakness (generalized): Secondary | ICD-10-CM | POA: Diagnosis not present

## 2016-01-22 DIAGNOSIS — R208 Other disturbances of skin sensation: Secondary | ICD-10-CM

## 2016-01-22 DIAGNOSIS — R2689 Other abnormalities of gait and mobility: Secondary | ICD-10-CM | POA: Diagnosis not present

## 2016-01-22 DIAGNOSIS — R269 Unspecified abnormalities of gait and mobility: Secondary | ICD-10-CM | POA: Diagnosis not present

## 2016-01-22 DIAGNOSIS — R2681 Unsteadiness on feet: Secondary | ICD-10-CM | POA: Diagnosis not present

## 2016-01-22 NOTE — Therapy (Signed)
[place], [place], [address] Phone: [phone]   Fax:  [phone]  Physical Therapy Treatment  Patient Details  Name: [name] MRN: [id number] Date of Birth: May 04, 1945 Referring Provider: Luster Landsberg PA-C  Encounter Date: 01/22/2016      PT End of Session - 01/22/16 1247    Visit Number 7   Number of Visits 17   Date for PT Re-Evaluation 02/26/16   Authorization Type Medicare: Kx mod by 15th visit, Progress note by 10th visit.    PT Start Time 1153   PT Stop Time 1233   PT Time Calculation (min) 40 min   Activity Tolerance Patient tolerated treatment well   Behavior During Therapy WFL for tasks assessed/performed      Past Medical History:  Diagnosis Date  . Arthritis     Past Surgical History:  Procedure Laterality Date  . ABDOMINAL HYSTERECTOMY  1995  . BUNIONECTOMY  2013   rt foot  . COLONOSCOPY    . MUSCLE BIOPSY Left 10/03/2012   Procedure: LEFT QUADRICEP MUSCLE BIOPSY;  Surgeon: Odis Hollingshead, MD;  Location: [facility];  Service: General;  Laterality: Left;  . NECK SURGERY  2010   cerv disc fused     There were no vitals filed for this visit.      Subjective Assessment - 01/22/16 1155    Subjective "I did alot of walking over the weekend and have some burning in the legs but not bad"   Currently in Pain? No/denies                         [facility] Adult PT Treatment/Exercise - 01/22/16 1156      Lumbar Exercises: Aerobic   Stationary Bike Nustep L 5 , 8 minutes, legs only   to promote endurance     Lumbar Exercises: Sidelying   Clam 20 reps   Clam Limitations left/ right and reverse x 20   Hip Abduction 10 reps  2 sets     Knee/Hip Exercises: Standing   Other Standing Knee Exercises toe taps on 6 inch step with bil 2 finger hold in // 1 x 15     Knee/Hip Exercises: Supine   Straight Leg Raises Strengthening;Right;3 sets;10  reps  verbal cues to keep core tight throughout exercise     Manual Therapy   Joint Mobilization long axis distraction grade 5                PT Education - 01/22/16 1246    Education provided Yes   Education Details take time when walking and focus on surroundings and being safe to avoid potential falls. doing exercises on both sides   Person(s) Educated Patient   Methods Explanation;Verbal cues   Comprehension Verbalized understanding;Verbal cues required          PT Short Term Goals - 01/15/16 1254      PT SHORT TERM GOAL #1   Title pt will be I with basic HEP (02/01/2016)   Time 4   Period Weeks   Status Achieved     PT SHORT TERM GOAL #2   Title pt will be able to verbalize and demo techniques for proper DME use and gait mechanics to promote safety (02/01/2016)   Time 4   Period Weeks   Status On-going     PT SHORT TERM GOAL #3   Title she will increase  trunk mobility by >/= 5 degrees with trunk flexion/ side bending to promote funcitonal mobility (02/01/2016)   Time 4   Period Weeks   Status On-going     PT SHORT TERM GOAL #4   Title she will improve bil hip exetensor / abductor strength to >/= 2+/5 on the R and 3/5 on the L for extension and >/= 2+/5 for abduction bil to promote functional strength (02/01/2016)   Time 4   Period Weeks   Status On-going     PT SHORT TERM GOAL #5   Title berg balance will be assessed and goals will be made based on assessment   Time 2   Period Weeks   Status Achieved           PT Long Term Goals - 01/15/16 1255      PT LONG TERM GOAL #1   Title pt will be I with all HEP given as of last visit (02/26/2016)   Time 8   Period Weeks   Status On-going     PT LONG TERM GOAL #2   Title increase trunk flexion by >/= 10 degrees and side bending by >/= 8 degrees without pt reporting pain or fear of falling or feeling unstable for functional mobility reqired for ADLs (02/26/2016)   Time 8   Period Weeks   Status  On-going     PT LONG TERM GOAL #3   Title pt will increase bil hip strength globally to >/= 4-/5 with </= 2/10 pain / burning for strength required for safety during walking/ standing activities (02/26/2016)   Time 8   Period Weeks   Status On-going     PT LONG TERM GOAL #4   Title she will be able to walk/ standing for >/= 30 min with </= 2/10 pain with LRAD for functional endurance required for ADLS ([id number])   Time 8   Period Weeks   Status On-going     PT LONG TERM GOAL #5   Title she will increase her FOTO score to >/= 55% limitation to demonstrate improvement in function at discharge (02/26/2016)   Time 8   Period Weeks   Status On-going     Additional Long Term Goals   Additional Long Term Goals Yes     PT LONG TERM GOAL #6   Title she will improve her BERG balance to >/= 46 to demonstrate improvement in balance to promote safety (02/26/2016)   Time 6   Period Weeks   Status New               Plan - 01/22/16 1250    Clinical Impression Statement Mrs. Winstanley continues to progress with hip flexion muscular activity since last session and reported decreased buring and improved control with walking/ standing. continued hip strengthening in suping with focus on increased sets to work on endurance. post session she reported feeling she was able to walk better with more control.    PT Next Visit Plan , hip strengthening,  proper use of DME; check goals, ROM.  long axis distraciton mobs, balance training,    Consulted and Agree with Plan of Care Patient      Patient will benefit from skilled therapeutic intervention in order to improve the following deficits and impairments:  Abnormal gait, Decreased endurance, Decreased activity tolerance, Decreased balance, Pain, Improper body mechanics, Postural dysfunction, Impaired sensation, Decreased strength, Hypomobility, Difficulty walking, Increased fascial restricitons, Decreased range of motion, Decreased knowledge of use of  DME  Visit Diagnosis: Muscle weakness (generalized)  Other abnormalities of gait and mobility  Unsteadiness on feet  Other disturbances of skin sensation     Problem List Patient Active Problem List   Diagnosis Date Noted  . Weakness 11/03/2012   Starr Lake PT, DPT, LAT, ATC  01/22/16  12:54 PM       [place] [facility] [address] [place], [place], [address] Phone: [phone]   Fax:  [phone]  Name: DENETRA HIRATA MRN: [id number] Date of Birth: [birth year]

## 2016-01-23 ENCOUNTER — Ambulatory Visit (INDEPENDENT_AMBULATORY_CARE_PROVIDER_SITE_OTHER): Payer: Medicare Other | Admitting: Family Medicine

## 2016-01-23 ENCOUNTER — Ambulatory Visit (INDEPENDENT_AMBULATORY_CARE_PROVIDER_SITE_OTHER): Payer: Medicare Other

## 2016-01-23 ENCOUNTER — Encounter: Payer: Medicare Other | Admitting: Physical Therapy

## 2016-01-23 VITALS — BP 130/86 | HR 76 | Temp 98.0°F | Resp 17 | Ht 69.0 in | Wt 237.0 lb

## 2016-01-23 DIAGNOSIS — M7989 Other specified soft tissue disorders: Secondary | ICD-10-CM

## 2016-01-23 DIAGNOSIS — L710 Perioral dermatitis: Secondary | ICD-10-CM | POA: Diagnosis not present

## 2016-01-23 MED ORDER — TRIAMCINOLONE ACETONIDE 0.1 % EX CREA: 1.0000 "application " | TOPICAL_CREAM | Freq: Two times a day (BID) | CUTANEOUS | 0 refills | Status: DC

## 2016-01-23 NOTE — Patient Instructions (Addendum)
You may remove you bandage tonight before bedtime. If still draining apply a bandaide.  You may take 500 mg naproxen or ibuprofen previously prescribed to decrease pain and inflammation.  IF you received an x-ray today, you will receive an invoice from [facility]. Please contact [facility] at [phone] with questions or concerns regarding your invoice.   IF you received labwork today, you will receive an invoice from [employer]. Please contact [facility] at [phone] with questions or concerns regarding your invoice.   Our billing staff will not be able to assist you with questions regarding bills from these companies.  You will be contacted with the lab results as soon as they are available. The fastest way to get your results is to activate your My Chart account. Instructions are located on the last page of this paperwork. If you have not heard from [place] regarding the results in 2 weeks, please contact this office.    We recommend that you schedule a mammogram for breast cancer screening. Typically, you do not need a referral to do this. Please contact a local imaging center to schedule your mammogram.  [facility] - [phone]  *ask for the Radiology Department [facility] - [phone] or [phone]  [facility] - [phone] [place] [phone] [facility]  - [phone]  *ask for the [facility] - [phone]  *ask for the Radiology Department [facility] - [phone]  *ask for the [place] - [phone]   Ganglion Cyst A ganglion cyst is a noncancerous, fluid-filled lump that occurs near joints or tendons. The ganglion cyst grows out of a joint or the lining of a tendon. It most often develops in the hand or wrist, but it can also develop in the shoulder,  elbow, hip, knee, ankle, or foot. The round or oval ganglion cyst can be the size of a pea or larger than a grape. Increased activity may enlarge the size of the cyst because more fluid starts to build up.  CAUSES It is not known what causes a ganglion cyst to grow. However, it may be related to:  Inflammation or irritation around the joint.  An injury.  Repetitive movements or overuse.  Arthritis. RISK FACTORS Risk factors include:  Being a woman.  Being age 77-50. SIGNS AND SYMPTOMS Symptoms may include:   A lump. This most often appears on the hand or wrist, but it can occur in other areas of the body.  Tingling.  Pain.  Numbness.  Muscle weakness.  Weak grip.  Less movement in a joint. DIAGNOSIS Ganglion cysts are most often diagnosed based on a physical exam. Your health care provider will feel the lump and may shine a light alongside it. If it is a ganglion cyst, a light often shines through it. Your health care provider may order an X-ray, ultrasound, or MRI to rule out other conditions. TREATMENT Ganglion cysts usually go away on their own without treatment. If pain or other symptoms are involved, treatment may be needed. Treatment is also needed if the ganglion cyst limits your movement or if it gets infected. Treatment may include:  Wearing a brace or splint on your wrist or finger.  Taking anti-inflammatory medicine.  Draining fluid from the lump with a needle (aspiration).  Injecting a steroid into the joint.  Surgery to remove the ganglion cyst. HOME CARE INSTRUCTIONS  Do not  press on the ganglion cyst, poke it with a needle, or hit it.  Take medicines only as directed by your health care provider.  Wear your brace or splint as directed by your health care provider.  Watch your ganglion cyst for any changes.  Keep all follow-up visits as directed by your health care provider. This is important. SEEK MEDICAL CARE IF:  Your ganglion cyst becomes  larger or more painful.  You have increased redness, red streaks, or swelling.  You have pus coming from the lump.  You have weakness or numbness in the affected area.  You have a fever or chills.   This information is not intended to replace advice given to you by your health care provider. Make sure you discuss any questions you have with your health care provider.   Document Released: 03/06/2000 Document Revised: 03/30/2014 Document Reviewed: 08/22/2013 Elsevier Interactive Patient Education [employer].

## 2016-01-23 NOTE — Progress Notes (Signed)
Patient ID: [name], female    DOB: 06-30-1945, 70 y.o.   MRN: [id number]  PCP: Merrilee Seashore, MD  Chief Complaint  Patient presents with  . Hand Pain    5th digit of left hand swollen, NKI.     Subjective:   HPI 70 year old female, present for evaluation of 5th left swollen finger times 2 weeks. Patient is new to [facility]. Reports that while on vacation she awaken with a large mass on the PIP joint of the 5th digit.  Initially the mass was painless and has become increasing tender over the last two weeks. Denies injury or insect bite.   Social History   Social History  . Marital status: Married    Spouse name: N/A  . Number of children: N/A  . Years of education: N/A   Occupational History  . Not on file.   Social History Main Topics  . Smoking status: Former Smoker    Quit date: 03/23/2008  . Smokeless tobacco: Never Used  . Alcohol use No  . Drug use: No  . Sexual activity: Not on file   Other Topics Concern  . Not on file   Social History Narrative  . No narrative on file   Family History  Problem Relation Age of Onset  . Hypertension Mother   . Cancer Sister    Review of Systems HPI  Patient Active Problem List   Diagnosis Date Noted  . Weakness 11/03/2012     Prior to Admission medications   Medication Sig Start Date End Date Taking? Authorizing Provider  cetirizine (ZYRTEC) 10 MG tablet Take 10 mg by mouth daily as needed for allergies.   Yes Historical Provider, MD  pantoprazole (PROTONIX) 40 MG tablet Take 40 mg by mouth daily as needed (for acid reflux).    Yes Historical Provider, MD  venlafaxine XR (EFFEXOR-XR) 75 MG 24 hr capsule Take 75 mg by mouth daily with breakfast.   Yes Historical Provider, MD  oxyCODONE-acetaminophen (PERCOCET/ROXICET) 5-325 MG per tablet Take 1-2 tablets by mouth every 6 (six) hours as needed for severe pain. Patient not taking: Reported on 01/23/2016 07/18/13   Delos Haring, PA-C     Allergies    Allergen Reactions  . Oxycontin [Oxycodone] Nausea Only  . Gadolinium Derivatives Swelling and Rash    Patient reported having rash on face and swelling of eyes the day after administration of MRI contrast on two occasions.         Objective:  Physical Exam  Constitutional: She is oriented to person, place, and time. She appears well-developed and well-nourished.  HENT:  Head: Normocephalic and atraumatic.  Right Ear: External ear normal.  Left Ear: External ear normal.  Eyes: Conjunctivae are normal. Pupils are equal, round, and reactive to light.  Neck: Normal range of motion. Neck supple.  Cardiovascular: Normal rate, regular rhythm, normal heart sounds and intact distal pulses.   Pulmonary/Chest: Effort normal and breath sounds normal.  Musculoskeletal: Normal range of motion.       Hands: Left finger non-erythematous and complete active ROM  Neurological: She is alert and oriented to person, place, and time. She has normal reflexes.  Skin: Skin is warm and dry.  Psychiatric: She has a normal mood and affect. Her behavior is normal. Judgment and thought content normal.   Procedure Note Aspiration of Cyst 1% Lidocaine injected directly into the cyst. With injection of lidocaine, cyst begin to drain an abundant amount of clear gelatinous drainage mixed  with some blood. Expressed the remainder of material manually and with aspiration of 18 gauge needle. Hemostasis achieved. Finger dressed with small bandage.  Vitals:   01/23/16 1348  BP: 130/86  Pulse: 76  Resp: 17  Temp: 98 F (36.7 C)     Assessment & Plan:  1. Swelling of left little finger, Ganglion cyst. Clear gelatinous , discharge expressed from mass. - DG Finger Little Left-unremarkable.  Patient tolerated procedure.  You may remove you bandage tonight before bedtime. If still draining apply a bandaide.  You may take 500 mg naproxen or ibuprofen previously prescribed to decrease pain and  inflammation.  Return for care as needed.  Carroll Sage. Kenton Kingfisher, MSN, FNP-C [facility]

## 2016-01-24 ENCOUNTER — Ambulatory Visit: Payer: Medicare Other | Admitting: Physical Therapy

## 2016-01-27 ENCOUNTER — Ambulatory Visit: Payer: Medicare Other | Admitting: Physical Therapy

## 2016-01-27 DIAGNOSIS — R2689 Other abnormalities of gait and mobility: Secondary | ICD-10-CM

## 2016-01-27 DIAGNOSIS — R269 Unspecified abnormalities of gait and mobility: Secondary | ICD-10-CM | POA: Diagnosis not present

## 2016-01-27 DIAGNOSIS — R29898 Other symptoms and signs involving the musculoskeletal system: Secondary | ICD-10-CM

## 2016-01-27 DIAGNOSIS — M6281 Muscle weakness (generalized): Secondary | ICD-10-CM | POA: Diagnosis not present

## 2016-01-27 DIAGNOSIS — R208 Other disturbances of skin sensation: Secondary | ICD-10-CM | POA: Diagnosis not present

## 2016-01-27 DIAGNOSIS — R2681 Unsteadiness on feet: Secondary | ICD-10-CM | POA: Diagnosis not present

## 2016-01-27 NOTE — Therapy (Signed)
[place], [place], [address] Phone: [phone]   Fax:  [phone]  Physical Therapy Treatment  Patient Details  Name: KYLII ENNIS MRN: [id number] Date of Birth: 1946-01-02 Referring Provider: Luster Landsberg PA-C  Encounter Date: 01/27/2016      PT End of Session - 01/27/16 1112    Visit Number 8   Number of Visits 17   Date for PT Re-Evaluation 02/26/16   Authorization Type Medicare: Kx mod by 15th visit, Progress note by 10th visit.    PT Start Time 1105   PT Stop Time 1145   PT Time Calculation (min) 40 min      Past Medical History:  Diagnosis Date  . Arthritis     Past Surgical History:  Procedure Laterality Date  . ABDOMINAL HYSTERECTOMY  1995  . BUNIONECTOMY  2013   rt foot  . COLONOSCOPY    . MUSCLE BIOPSY Left 10/03/2012   Procedure: LEFT QUADRICEP MUSCLE BIOPSY;  Surgeon: Odis Hollingshead, MD;  Location: [facility];  Service: General;  Laterality: Left;  . NECK SURGERY  2010   cerv disc fused     There were no vitals filed for this visit.      Subjective Assessment - 01/27/16 1112    Subjective I had to drive myself   Currently in Pain? No/denies            [facility] PT Assessment - 01/27/16 0001      AROM   Lumbar Flexion 35   Lumbar - Right Side Bend 20   Lumbar - Left Side Bend 15     Strength   Right Hip Extension 3-/5   Right Hip ABduction 3-/5   Left Hip Extension 3/5   Left Hip ABduction 3+/5   Left Hip ADduction 4/5                     [facility] Adult PT Treatment/Exercise - 01/27/16 0001      Lumbar Exercises: Aerobic   Stationary Bike Nustep L 5 , 7 minutes, legs only   to promote endurance     Lumbar Exercises: Supine   Bridge 10 reps   Bridge Limitations small lift off table x 10, with ball squeeze x 10     Knee/Hip Exercises: Supine   Short Arc Quad Sets 2 sets;10 reps   Short Arc Quad Sets Limitations 3#   Straight Leg Raises Strengthening;Right;10 reps;2 sets  verbal cues to keep core tight throughout exercise     Knee/Hip Exercises: Sidelying   Hip ABduction 2 sets;10 reps                  PT Short Term Goals - 01/27/16 1150      PT SHORT TERM GOAL #1   Title pt will be I with basic HEP (02/01/2016)   Time 4   Period Weeks   Status Achieved     PT SHORT TERM GOAL #2   Title pt will be able to verbalize and demo techniques for proper DME use and gait mechanics to promote safety (02/01/2016)   Time 4   Period Weeks   Status On-going     PT SHORT TERM GOAL #3   Title she will increase trunk mobility by >/= 5 degrees with trunk flexion/ side bending to promote funcitonal mobility (02/01/2016)   Time 4   Period Weeks   Status Achieved  PT SHORT TERM GOAL #4   Title she will improve bil hip exetensor / abductor strength to >/= 2+/5 on the R and 3/5 on the L for extension and >/= 2+/5 for abduction bil to promote functional strength (02/01/2016)   Time 4   Period Weeks   Status Achieved     PT SHORT TERM GOAL #5   Title berg balance will be assessed and goals will be made based on assessment   Status Achieved           PT Long Term Goals - 01/15/16 1255      PT LONG TERM GOAL #1   Title pt will be I with all HEP given as of last visit (02/26/2016)   Time 8   Period Weeks   Status On-going     PT LONG TERM GOAL #2   Title increase trunk flexion by >/= 10 degrees and side bending by >/= 8 degrees without pt reporting pain or fear of falling or feeling unstable for functional mobility reqired for ADLs (02/26/2016)   Time 8   Period Weeks   Status On-going     PT LONG TERM GOAL #3   Title pt will increase bil hip strength globally to >/= 4-/5 with </= 2/10 pain / burning for strength required for safety during walking/ standing activities (02/26/2016)   Time 8   Period Weeks   Status On-going     PT LONG TERM GOAL #4   Title she will be able to walk/  standing for >/= 30 min with </= 2/10 pain with LRAD for functional endurance required for ADLS (76/09/3417)   Time 8   Period Weeks   Status On-going     PT LONG TERM GOAL #5   Title she will increase her FOTO score to >/= 55% limitation to demonstrate improvement in function at discharge (02/26/2016)   Time 8   Period Weeks   Status On-going     Additional Long Term Goals   Additional Long Term Goals Yes     PT LONG TERM GOAL #6   Title she will improve her BERG balance to >/= 46 to demonstrate improvement in balance to promote safety (02/26/2016)   Time 6   Period Weeks   Status New               Plan - 01/27/16 1137    Clinical Impression Statement Mrs. Sowash [place] the mall on [place] for 2 miles with no pain, only fatigue. Her MMT reveals improvements in hip strength. Her Lumbar AROM has also improved. STG# 3, #4 Met.    PT Next Visit Plan FOTO Next  hip strengthening,  proper use of DME; check goals, ROM.  long axis distraciton mobs, balance training,    PT Home Exercise Plan sidelying clam shell, seated calf raise, glute squeeze, SAQ, supine clam with red band( 01/09/2016) standing facing wall slides weight shifts and with hip flexion.    Consulted and Agree with Plan of Care Patient      Patient will benefit from skilled therapeutic intervention in order to improve the following deficits and impairments:  Abnormal gait, Decreased endurance, Decreased activity tolerance, Decreased balance, Pain, Improper body mechanics, Postural dysfunction, Impaired sensation, Decreased strength, Hypomobility, Difficulty walking, Increased fascial restricitons, Decreased range of motion, Decreased knowledge of use of DME  Visit Diagnosis: Muscle weakness (generalized)  Other abnormalities of gait and mobility  Unsteadiness on feet  Other disturbances of skin sensation  Weakness of both legs  Abnormality of gait  Unsteadiness     Problem List Patient Active  Problem List   Diagnosis Date Noted  . Weakness 11/03/2012    Dorene Ar, PTA 01/27/2016, 1:02 PM  [facility] [address] [place], [place], [address] Phone: [phone]   Fax:  [phone]  Name: ATZIRY BARANSKI MRN: [id number] Date of Birth: May 31, 1945

## 2016-01-29 ENCOUNTER — Ambulatory Visit: Payer: Medicare Other | Admitting: Physical Therapy

## 2016-01-29 DIAGNOSIS — R29898 Other symptoms and signs involving the musculoskeletal system: Secondary | ICD-10-CM | POA: Diagnosis not present

## 2016-01-29 DIAGNOSIS — R208 Other disturbances of skin sensation: Secondary | ICD-10-CM | POA: Diagnosis not present

## 2016-01-29 DIAGNOSIS — M6281 Muscle weakness (generalized): Secondary | ICD-10-CM | POA: Diagnosis not present

## 2016-01-29 DIAGNOSIS — R2681 Unsteadiness on feet: Secondary | ICD-10-CM | POA: Diagnosis not present

## 2016-01-29 DIAGNOSIS — R269 Unspecified abnormalities of gait and mobility: Secondary | ICD-10-CM | POA: Diagnosis not present

## 2016-01-29 DIAGNOSIS — R2689 Other abnormalities of gait and mobility: Secondary | ICD-10-CM | POA: Diagnosis not present

## 2016-01-29 NOTE — Therapy (Signed)
[place], [place], [address] Phone: [phone]   Fax:  [phone]  Physical Therapy Treatment  Patient Details  Name: [name] MRN: [id number] Date of Birth: 09/09/1945 Referring Provider: Luster Landsberg PA-C  Encounter Date: 01/29/2016      PT End of Session - 01/29/16 1244    Visit Number 9   Number of Visits 17   Date for PT Re-Evaluation 02/26/16   Authorization Type Medicare: Kx mod by 15th visit, Progress note by 18th visit.    PT Start Time 1146   PT Stop Time 1231   PT Time Calculation (min) 45 min   Activity Tolerance Patient tolerated treatment well   Behavior During Therapy WFL for tasks assessed/performed      Past Medical History:  Diagnosis Date  . Arthritis     Past Surgical History:  Procedure Laterality Date  . ABDOMINAL HYSTERECTOMY  1995  . BUNIONECTOMY  2013   rt foot  . COLONOSCOPY    . MUSCLE BIOPSY Left 10/03/2012   Procedure: LEFT QUADRICEP MUSCLE BIOPSY;  Surgeon: Odis Hollingshead, MD;  Location: [facility];  Service: General;  Laterality: Left;  . NECK SURGERY  2010   cerv disc fused     There were no vitals filed for this visit.      Subjective Assessment - 01/29/16 1147    Subjective "I am having some achiness in my thighs today, im not sure why I am having it today"    Currently in Pain? No/denies            [facility] PT Assessment - 01/29/16 1209      Observation/Other Assessments   Focus on Therapeutic Outcomes (FOTO)  50% limited                     [facility] Adult PT Treatment/Exercise - 01/29/16 1153      Lumbar Exercises: Aerobic   Stationary Bike Nustep L 5 , 9 minutes, legs only      Lumbar Exercises: Seated   Long Arc Quad on Chair Strengthening;Both;2 sets;10 reps   while seated on dynadisc for core strength   LAQ on Chair Weights (lbs) 2   Hip Flexion on Ball Strengthening;Both;10 reps  seated on  dyna-disc     Lumbar Exercises: Supine   Other Supine Lumbar Exercises clams 2 x 25 with yellow therband  controlled eccentric contraction     Knee/Hip Exercises: Supine   Bridges Strengthening;Both;1 set;10 reps  2 sets with glute contraction,      Knee/Hip Exercises: Sidelying   Hip ABduction 2 sets;10 reps                  PT Short Term Goals - 01/29/16 1210      PT SHORT TERM GOAL #1   Title pt will be I with basic HEP (02/01/2016)   Time 4   Period Weeks   Status Achieved     PT SHORT TERM GOAL #2   Title pt will be able to verbalize and demo techniques for proper DME use and gait mechanics to promote safety (02/01/2016)   Time 4   Period Weeks   Status Achieved     PT SHORT TERM GOAL #3   Title she will increase trunk mobility by >/= 5 degrees with trunk flexion/ side bending to promote funcitonal mobility (02/01/2016)   Time 4   Period Weeks   Status  Achieved     PT SHORT TERM GOAL #4   Title she will improve bil hip exetensor / abductor strength to >/= 2+/5 on the R and 3/5 on the L for extension and >/= 2+/5 for abduction bil to promote functional strength (02/01/2016)   Time 4   Period Weeks   Status Achieved     PT SHORT TERM GOAL #5   Title berg balance will be assessed and goals will be made based on assessment   Time 2   Period Weeks   Status Achieved           PT Long Term Goals - 01/15/16 1255      PT LONG TERM GOAL #1   Title pt will be I with all HEP given as of last visit (02/26/2016)   Time 8   Period Weeks   Status On-going     PT LONG TERM GOAL #2   Title increase trunk flexion by >/= 10 degrees and side bending by >/= 8 degrees without pt reporting pain or fear of falling or feeling unstable for functional mobility reqired for ADLs (02/26/2016)   Time 8   Period Weeks   Status On-going     PT LONG TERM GOAL #3   Title pt will increase bil hip strength globally to >/= 4-/5 with </= 2/10 pain / burning for strength  required for safety during walking/ standing activities (02/26/2016)   Time 8   Period Weeks   Status On-going     PT LONG TERM GOAL #4   Title she will be able to walk/ standing for >/= 30 min with </= 2/10 pain with LRAD for functional endurance required for ADLS ([id number])   Time 8   Period Weeks   Status On-going     PT LONG TERM GOAL #5   Title she will increase her FOTO score to >/= 55% limitation to demonstrate improvement in function at discharge (02/26/2016)   Time 8   Period Weeks   Status On-going     Additional Long Term Goals   Additional Long Term Goals Yes     PT LONG TERM GOAL #6   Title she will improve her BERG balance to >/= 46 to demonstrate improvement in balance to promote safety (02/26/2016)   Time 6   Period Weeks   Status New               Plan - 01/29/16 1244    Clinical Impression Statement continued working on hip and bil LE strengthening. worked on core strengthening in sitting while on dynadisc, while working on LE strengthening. She was able to do all exercises with no report of pain just fatigue. she improved her FOTO Score to 50% limited.    PT Next Visit Plan Next  hip strengthening,  proper use of DME; check goals, ROM.  long axis distraciton mobs, balance training,    PT Home Exercise Plan sidelying clam shell, seated calf raise, glute squeeze, SAQ, supine clam with red band( 01/09/2016) standing facing wall slides weight shifts and with hip flexion.    Consulted and Agree with Plan of Care Patient      Patient will benefit from skilled therapeutic intervention in order to improve the following deficits and impairments:  Abnormal gait, Decreased endurance, Decreased activity tolerance, Decreased balance, Pain, Improper body mechanics, Postural dysfunction, Impaired sensation, Decreased strength, Hypomobility, Difficulty walking, Increased fascial restricitons, Decreased range of motion, Decreased knowledge of use of DME  Visit  Diagnosis: Muscle weakness (generalized)  Other abnormalities of gait and mobility  Unsteadiness on feet       G-Codes - 02/18/2016 1251    Functional Assessment Tool Used FOTO/ clinical judgement   Functional Limitation Mobility: Walking and moving around   Mobility: Walking and Moving Around Current Status [phone]) At least 40 percent but less than 60 percent impaired, limited or restricted   Mobility: Walking and Moving Around Goal Status [phone]) At least 40 percent but less than 60 percent impaired, limited or restricted      Problem List Patient Active Problem List   Diagnosis Date Noted  . Weakness 11/03/2012   [place] PT, DPT, LAT, ATC  18-Feb-2016  12:54 PM      [facility] [address] [place], [place], [address] Phone: [phone]   Fax:  [phone]  Name: [name] MRN: [id number] Date of Birth: [birth year]

## 2016-01-31 ENCOUNTER — Ambulatory Visit: Payer: Medicare Other | Admitting: Physical Therapy

## 2016-01-31 DIAGNOSIS — R2689 Other abnormalities of gait and mobility: Secondary | ICD-10-CM | POA: Diagnosis not present

## 2016-01-31 DIAGNOSIS — R208 Other disturbances of skin sensation: Secondary | ICD-10-CM | POA: Diagnosis not present

## 2016-01-31 DIAGNOSIS — R2681 Unsteadiness on feet: Secondary | ICD-10-CM | POA: Diagnosis not present

## 2016-01-31 DIAGNOSIS — R29898 Other symptoms and signs involving the musculoskeletal system: Secondary | ICD-10-CM

## 2016-01-31 DIAGNOSIS — M6281 Muscle weakness (generalized): Secondary | ICD-10-CM

## 2016-01-31 DIAGNOSIS — R269 Unspecified abnormalities of gait and mobility: Secondary | ICD-10-CM | POA: Diagnosis not present

## 2016-01-31 NOTE — Therapy (Signed)
[place], [place], [address] Phone: [phone]   Fax:  [phone]  Physical Therapy Treatment  Patient Details  Name: [name] MRN: [id number] Date of Birth: 1945/07/26 Referring Provider: Luster Landsberg PA-C  Encounter Date: 01/31/2016      PT End of Session - 01/31/16 1109    Visit Number 10   Number of Visits 17   Date for PT Re-Evaluation 02/26/16   Authorization Type Medicare: Kx mod by 15th visit, Progress note by 18th visit.    PT Start Time 1104   PT Stop Time 1145   PT Time Calculation (min) 41 min      Past Medical History:  Diagnosis Date  . Arthritis     Past Surgical History:  Procedure Laterality Date  . ABDOMINAL HYSTERECTOMY  1995  . BUNIONECTOMY  2013   rt foot  . COLONOSCOPY    . MUSCLE BIOPSY Left 10/03/2012   Procedure: LEFT QUADRICEP MUSCLE BIOPSY;  Surgeon: Odis Hollingshead, MD;  Location: [facility];  Service: General;  Laterality: Left;  . NECK SURGERY  2010   cerv disc fused     There were no vitals filed for this visit.                       [facility] Adult PT Treatment/Exercise - 01/31/16 0001      Lumbar Exercises: Aerobic   Stationary Bike Nustep L 6 , 7 minutes, legs only      Lumbar Exercises: Seated   Long Arc Quad on Chair Strengthening;Both;2 sets;10 reps   while seated on dynadisc for core strength   Hip Flexion on Ball Strengthening;Both;10 reps  seated on dyna-disc   Other Seated Lumbar Exercises hamstring curls red band x 15 each     Lumbar Exercises: Supine   Bridge 10 reps   Bridge Limitations small lift off table x 10, with ball squeeze x 10   Other Supine Lumbar Exercises clams 2 x 25 with red therband  controlled eccentric contraction     Lumbar Exercises: Sidelying   Hip Abduction 10 reps  2 sets     Knee/Hip Exercises: Supine   Bridges Strengthening;Both;1 set;10 reps  2 sets with glute  contraction,    Straight Leg Raises Strengthening;Right;10 reps;2 sets  verbal cues to keep core tight throughout exercise     Knee/Hip Exercises: Sidelying   Hip ABduction 2 sets;10 reps                  PT Short Term Goals - 01/29/16 1210      PT SHORT TERM GOAL #1   Title pt will be I with basic HEP (02/01/2016)   Time 4   Period Weeks   Status Achieved     PT SHORT TERM GOAL #2   Title pt will be able to verbalize and demo techniques for proper DME use and gait mechanics to promote safety (02/01/2016)   Time 4   Period Weeks   Status Achieved     PT SHORT TERM GOAL #3   Title she will increase trunk mobility by >/= 5 degrees with trunk flexion/ side bending to promote funcitonal mobility (02/01/2016)   Time 4   Period Weeks   Status Achieved     PT SHORT TERM GOAL #4   Title she will improve bil hip exetensor / abductor strength to >/= 2+/5 on the R and 3/5  on the L for extension and >/= 2+/5 for abduction bil to promote functional strength (02/01/2016)   Time 4   Period Weeks   Status Achieved     PT SHORT TERM GOAL #5   Title berg balance will be assessed and goals will be made based on assessment   Time 2   Period Weeks   Status Achieved           PT Long Term Goals - 01/15/16 1255      PT LONG TERM GOAL #1   Title pt will be I with all HEP given as of last visit (02/26/2016)   Time 8   Period Weeks   Status On-going     PT LONG TERM GOAL #2   Title increase trunk flexion by >/= 10 degrees and side bending by >/= 8 degrees without pt reporting pain or fear of falling or feeling unstable for functional mobility reqired for ADLs (02/26/2016)   Time 8   Period Weeks   Status On-going     PT LONG TERM GOAL #3   Title pt will increase bil hip strength globally to >/= 4-/5 with </= 2/10 pain / burning for strength required for safety during walking/ standing activities (02/26/2016)   Time 8   Period Weeks   Status On-going     PT LONG TERM  GOAL #4   Title she will be able to walk/ standing for >/= 30 min with </= 2/10 pain with LRAD for functional endurance required for ADLS ([id number])   Time 8   Period Weeks   Status On-going     PT LONG TERM GOAL #5   Title she will increase her FOTO score to >/= 55% limitation to demonstrate improvement in function at discharge (02/26/2016)   Time 8   Period Weeks   Status On-going     Additional Long Term Goals   Additional Long Term Goals Yes     PT LONG TERM GOAL #6   Title she will improve her BERG balance to >/= 46 to demonstrate improvement in balance to promote safety (02/26/2016)   Time 6   Period Weeks   Status New               Plan - 01/31/16 1154    Clinical Impression Statement Continued LE and core strength, pt reports she is more tired today. No increased pain. Most difficulty with right hip flexion exercises. SHe had difficulty with hip flexion while sitting on dyna disc.    PT Next Visit Plan Next  hip strengthening,  proper use of DME; check goals, ROM.  long axis distraciton mobs, balance training,    PT Home Exercise Plan sidelying clam shell, seated calf raise, glute squeeze, SAQ, supine clam with red band( 01/09/2016) standing facing wall slides weight shifts and with hip flexion.       Patient will benefit from skilled therapeutic intervention in order to improve the following deficits and impairments:  Abnormal gait, Decreased endurance, Decreased activity tolerance, Decreased balance, Pain, Improper body mechanics, Postural dysfunction, Impaired sensation, Decreased strength, Hypomobility, Difficulty walking, Increased fascial restricitons, Decreased range of motion, Decreased knowledge of use of DME  Visit Diagnosis: Muscle weakness (generalized)  Other abnormalities of gait and mobility  Unsteadiness on feet  Other disturbances of skin sensation  Weakness of both legs  Abnormality of gait     Problem List Patient Active Problem List    Diagnosis Date Noted  . Weakness 11/03/2012  Dorene Ar, [place] 01/31/2016, 11:58 AM  [place], [place], [address] Phone: [phone]   Fax:  [phone]  Name: [name] MRN: [id number] Date of Birth: 01-28-1946

## 2016-02-03 ENCOUNTER — Ambulatory Visit: Payer: Medicare Other | Admitting: Physical Therapy

## 2016-02-03 DIAGNOSIS — R269 Unspecified abnormalities of gait and mobility: Secondary | ICD-10-CM | POA: Diagnosis not present

## 2016-02-03 DIAGNOSIS — R2681 Unsteadiness on feet: Secondary | ICD-10-CM | POA: Diagnosis not present

## 2016-02-03 DIAGNOSIS — R208 Other disturbances of skin sensation: Secondary | ICD-10-CM | POA: Diagnosis not present

## 2016-02-03 DIAGNOSIS — R29898 Other symptoms and signs involving the musculoskeletal system: Secondary | ICD-10-CM | POA: Diagnosis not present

## 2016-02-03 DIAGNOSIS — M6281 Muscle weakness (generalized): Secondary | ICD-10-CM | POA: Diagnosis not present

## 2016-02-03 DIAGNOSIS — R2689 Other abnormalities of gait and mobility: Secondary | ICD-10-CM | POA: Diagnosis not present

## 2016-02-03 NOTE — Therapy (Signed)
[place], [place], [address] Phone: [phone]   Fax:  [phone]  Physical Therapy Treatment  Patient Details  Name: [name] MRN: [id number] Date of Birth: December 10, 1945 Referring Provider: Luster Landsberg PA-C  Encounter Date: 02/03/2016      PT End of Session - 02/03/16 1304    Visit Number 11   Number of Visits 17   Date for PT Re-Evaluation 02/26/16   Authorization Type Medicare: Kx mod by 15th visit, Progress note by 18th visit.    PT Start Time 1104   PT Stop Time 1144   PT Time Calculation (min) 40 min      Past Medical History:  Diagnosis Date  . Arthritis     Past Surgical History:  Procedure Laterality Date  . ABDOMINAL HYSTERECTOMY  1995  . BUNIONECTOMY  2013   rt foot  . COLONOSCOPY    . MUSCLE BIOPSY Left 10/03/2012   Procedure: LEFT QUADRICEP MUSCLE BIOPSY;  Surgeon: Odis Hollingshead, MD;  Location: [facility];  Service: General;  Laterality: Left;  . NECK SURGERY  2010   cerv disc fused     There were no vitals filed for this visit.      Subjective Assessment - 02/03/16 1143    Subjective Doing pretty good. I cooked dinner the other night.    Currently in Pain? No/denies                         [facility] Adult PT Treatment/Exercise - 02/03/16 0001      Lumbar Exercises: Aerobic   Stationary Bike Nustep L 6 , 7 minutes, legs only      Lumbar Exercises: Seated   Long Arc Quad on Chair Strengthening;Both;2 sets;10 reps   while seated on dynadisc for core strength   Hip Flexion on Ball Strengthening;Both;10 reps  seated on dyna-disc   Other Seated Lumbar Exercises hamstring curls red band x 15 each     Lumbar Exercises: Supine   Other Supine Lumbar Exercises clams 2 x 25 with red therband  controlled eccentric contraction     Knee/Hip Exercises: Supine   Bridges Strengthening;Both;1 set;10 reps  2 sets with glute contraction,     Straight Leg Raises Strengthening;Right;10 reps;2 sets  verbal cues to keep core tight throughout exercise     Knee/Hip Exercises: Sidelying   Hip ABduction 2 sets;10 reps                  PT Short Term Goals - 01/29/16 1210      PT SHORT TERM GOAL #1   Title pt will be I with basic HEP (02/01/2016)   Time 4   Period Weeks   Status Achieved     PT SHORT TERM GOAL #2   Title pt will be able to verbalize and demo techniques for proper DME use and gait mechanics to promote safety (02/01/2016)   Time 4   Period Weeks   Status Achieved     PT SHORT TERM GOAL #3   Title she will increase trunk mobility by >/= 5 degrees with trunk flexion/ side bending to promote funcitonal mobility (02/01/2016)   Time 4   Period Weeks   Status Achieved     PT SHORT TERM GOAL #4   Title she will improve bil hip exetensor / abductor strength to >/= 2+/5 on the R and 3/5 on the L for  extension and >/= 2+/5 for abduction bil to promote functional strength (02/01/2016)   Time 4   Period Weeks   Status Achieved     PT SHORT TERM GOAL #5   Title berg balance will be assessed and goals will be made based on assessment   Time 2   Period Weeks   Status Achieved           PT Long Term Goals - 02/03/16 1227      PT LONG TERM GOAL #1   Title pt will be I with all HEP given as of last visit (02/26/2016)   Status On-going     PT LONG TERM GOAL #2   Title increase trunk flexion by >/= 10 degrees and side bending by >/= 8 degrees without pt reporting pain or fear of falling or feeling unstable for functional mobility reqired for ADLs (02/26/2016)   Status Unable to assess     PT LONG TERM GOAL #3   Title pt will increase bil hip strength globally to >/= 4-/5 with </= 2/10 pain / burning for strength required for safety during walking/ standing activities (02/26/2016)   Status Unable to assess     PT LONG TERM GOAL #4   Title she will be able to walk/ standing for >/= 30 min with </=  2/10 pain with LRAD for functional endurance required for ADLS (95/04/8411)   Baseline 1 hour to cook    Period Weeks   Status Achieved     PT LONG TERM GOAL #5   Title she will increase her FOTO score to >/= 55% limitation to demonstrate improvement in function at discharge (02/26/2016)   Status Unable to assess     PT LONG TERM GOAL #6   Title she will improve her BERG balance to >/= 46 to demonstrate improvement in balance to promote safety (02/26/2016)   Status Unable to assess               Plan - 02/03/16 1227    Clinical Impression Statement Pt reports standing/walking in kitchen for 1 hour prior to fatigue, no increased pain. LTG# 4 MET. Pt unaware that she is using UE to assist RLE frequently throughout treatment. Asked her to avoid assisting LE with therex and functional activities if able.    PT Next Visit Plan hip strengthening,  proper use of DME; check goals, ROM.  long axis distraciton mobs, balance training,    PT Home Exercise Plan sidelying clam shell, seated calf raise, glute squeeze, SAQ, supine clam with red band( 01/09/2016) standing facing wall slides weight shifts and with hip flexion.       Patient will benefit from skilled therapeutic intervention in order to improve the following deficits and impairments:  Abnormal gait, Decreased endurance, Decreased activity tolerance, Decreased balance, Pain, Improper body mechanics, Postural dysfunction, Impaired sensation, Decreased strength, Hypomobility, Difficulty walking, Increased fascial restricitons, Decreased range of motion, Decreased knowledge of use of DME  Visit Diagnosis: Muscle weakness (generalized)  Other abnormalities of gait and mobility  Unsteadiness on feet  Other disturbances of skin sensation  Weakness of both legs  Abnormality of gait     Problem List Patient Active Problem List   Diagnosis Date Noted  . Weakness 11/03/2012    Dorene Ar, PTA 02/03/2016, 1:09  PM  [place], [place], [address] Phone: [phone]   Fax:  [phone]  Name: [name] MRN: [id number] Date of  Birth: [birth year]

## 2016-02-05 ENCOUNTER — Ambulatory Visit: Payer: Medicare Other | Admitting: Physical Therapy

## 2016-02-05 DIAGNOSIS — R2681 Unsteadiness on feet: Secondary | ICD-10-CM | POA: Diagnosis not present

## 2016-02-05 DIAGNOSIS — R269 Unspecified abnormalities of gait and mobility: Secondary | ICD-10-CM | POA: Diagnosis not present

## 2016-02-05 DIAGNOSIS — R208 Other disturbances of skin sensation: Secondary | ICD-10-CM | POA: Diagnosis not present

## 2016-02-05 DIAGNOSIS — R2689 Other abnormalities of gait and mobility: Secondary | ICD-10-CM

## 2016-02-05 DIAGNOSIS — M6281 Muscle weakness (generalized): Secondary | ICD-10-CM | POA: Diagnosis not present

## 2016-02-05 DIAGNOSIS — R29898 Other symptoms and signs involving the musculoskeletal system: Secondary | ICD-10-CM | POA: Diagnosis not present

## 2016-02-05 NOTE — Therapy (Signed)
[name], [place], [address] Phone: [phone]   Fax:  [phone]  Physical Therapy Treatment  Patient Details  Name: [name] MRN: [id number] Date of Birth: [birth year] Referring Provider: Luster Landsberg PA-C  Encounter Date: 02/05/2016      PT End of Session - 02/05/16 1132    Visit Number 12   Date for PT Re-Evaluation 02/26/16   Authorization Type Medicare: Kx mod by 15th visit, Progress note by 18th visit.    PT Start Time 1103   PT Stop Time 1145   PT Time Calculation (min) 42 min      Past Medical History:  Diagnosis Date  . Arthritis     Past Surgical History:  Procedure Laterality Date  . ABDOMINAL HYSTERECTOMY  1995  . BUNIONECTOMY  2013   rt foot  . COLONOSCOPY    . MUSCLE BIOPSY Left 10/03/2012   Procedure: LEFT QUADRICEP MUSCLE BIOPSY;  Surgeon: Odis Hollingshead, MD;  Location: [facility];  Service: General;  Laterality: Left;  . NECK SURGERY  2010   cerv disc fused     There were no vitals filed for this visit.      Subjective Assessment - 02/05/16 1108    Subjective My legs are burning, I did alot of food shopping yesterday.            [facility] PT Assessment - 02/05/16 0001      Strength   Right Hip Flexion 2+/5   Left Hip Flexion 4+/5                     [facility] Adult PT Treatment/Exercise - 02/05/16 0001      Lumbar Exercises: Aerobic   Stationary Bike Nustep L 6 , 10 minutes, legs only      Knee/Hip Exercises: Standing   Other Standing Knee Exercises Tandem stance unable to hold for >3 sec if RLE back., can hold for 10-15 sec if LLE back, SLS with opposite hip flexion and abduction, requires assist for full ROM of right hip.      Knee/Hip Exercises: Supine   Straight Leg Raises Strengthening;Right;10 reps;2 sets  verbal cues to keep core tight throughout exercise     Manual Therapy   Joint Mobilization long axis  distraction , A/P mobs grade 3                   PT Short Term Goals - 01/29/16 1210      PT SHORT TERM GOAL #1   Title pt will be I with basic HEP (02/01/2016)   Time 4   Period Weeks   Status Achieved     PT SHORT TERM GOAL #2   Title pt will be able to verbalize and demo techniques for proper DME use and gait mechanics to promote safety (02/01/2016)   Time 4   Period Weeks   Status Achieved     PT SHORT TERM GOAL #3   Title she will increase trunk mobility by >/= 5 degrees with trunk flexion/ side bending to promote funcitonal mobility (02/01/2016)   Time 4   Period Weeks   Status Achieved     PT SHORT TERM GOAL #4   Title she will improve bil hip exetensor / abductor strength to >/= 2+/5 on the R and 3/5 on the L for extension and >/= 2+/5 for abduction bil to promote functional strength (02/01/2016)   Time  4   Period Weeks   Status Achieved     PT SHORT TERM GOAL #5   Title berg balance will be assessed and goals will be made based on assessment   Time 2   Period Weeks   Status Achieved           PT Long Term Goals - 02/03/16 1227      PT LONG TERM GOAL #1   Title pt will be I with all HEP given as of last visit (02/26/2016)   Status On-going     PT LONG TERM GOAL #2   Title increase trunk flexion by >/= 10 degrees and side bending by >/= 8 degrees without pt reporting pain or fear of falling or feeling unstable for functional mobility reqired for ADLs (02/26/2016)   Status Unable to assess     PT LONG TERM GOAL #3   Title pt will increase bil hip strength globally to >/= 4-/5 with </= 2/10 pain / burning for strength required for safety during walking/ standing activities (02/26/2016)   Status Unable to assess     PT LONG TERM GOAL #4   Title she will be able to walk/ standing for >/= 30 min with </= 2/10 pain with LRAD for functional endurance required for ADLS ([id number])   Baseline 1 hour to cook    Period Weeks   Status Achieved     PT  LONG TERM GOAL #5   Title she will increase her FOTO score to >/= 55% limitation to demonstrate improvement in function at discharge (02/26/2016)   Status Unable to assess     PT LONG TERM GOAL #6   Title she will improve her BERG balance to >/= 46 to demonstrate improvement in balance to promote safety (02/26/2016)   Status Unable to assess               Plan - 02/05/16 1132    Clinical Impression Statement increased strength noted after right hip mobilization however unable to lift knee without assist in parallel bars. Balance training including tandem stance and SLS with opposite hip motions. CUes throughout to decrease compensations and improve posure.    PT Next Visit Plan hip strengthening,  proper use of DME; check goals, ROM.  long axis distraciton mobs, balance training,    PT Home Exercise Plan sidelying clam shell, seated calf raise, glute squeeze, SAQ, supine clam with red band( 01/09/2016) standing facing wall slides weight shifts and with hip flexion.       Patient will benefit from skilled therapeutic intervention in order to improve the following deficits and impairments:  Abnormal gait, Decreased endurance, Decreased activity tolerance, Decreased balance, Pain, Improper body mechanics, Postural dysfunction, Impaired sensation, Decreased strength, Hypomobility, Difficulty walking, Increased fascial restricitons, Decreased range of motion, Decreased knowledge of use of DME  Visit Diagnosis: Muscle weakness (generalized)  Other abnormalities of gait and mobility  Unsteadiness on feet  Other disturbances of skin sensation  Weakness of both legs  Abnormality of gait     Problem List Patient Active Problem List   Diagnosis Date Noted  . Weakness 11/03/2012    Dorene Ar, PTA 02/05/2016, 2:04 PM  [facility] [address] [place], [place], [address] Phone: [phone]   Fax:   [phone]  Name: [name] MRN: [id number] Date of Birth: 01/20/1946

## 2016-02-07 ENCOUNTER — Encounter: Payer: Medicare Other | Admitting: Physical Therapy

## 2016-02-17 ENCOUNTER — Ambulatory Visit: Payer: Medicare Other | Admitting: Physical Therapy

## 2016-02-17 DIAGNOSIS — R2681 Unsteadiness on feet: Secondary | ICD-10-CM | POA: Diagnosis not present

## 2016-02-17 DIAGNOSIS — R29898 Other symptoms and signs involving the musculoskeletal system: Secondary | ICD-10-CM

## 2016-02-17 DIAGNOSIS — M6281 Muscle weakness (generalized): Secondary | ICD-10-CM | POA: Diagnosis not present

## 2016-02-17 DIAGNOSIS — R2689 Other abnormalities of gait and mobility: Secondary | ICD-10-CM

## 2016-02-17 DIAGNOSIS — R208 Other disturbances of skin sensation: Secondary | ICD-10-CM

## 2016-02-17 DIAGNOSIS — R269 Unspecified abnormalities of gait and mobility: Secondary | ICD-10-CM | POA: Diagnosis not present

## 2016-02-17 NOTE — Therapy (Signed)
[place], [place], [address] Phone: [phone]   Fax:  [phone]  Physical Therapy Treatment  Patient Details  Name: [name] MRN: [id number] Date of Birth: [birth year] Referring Provider: Luster Landsberg PA-C  Encounter Date: 02/17/2016      PT End of Session - 02/17/16 1245    Visit Number 13   Number of Visits 17   Date for PT Re-Evaluation 02/26/16   Authorization Type Medicare: Kx mod by 15th visit, Progress note by 18th visit.    PT Start Time 1235   PT Stop Time 1319   PT Time Calculation (min) 44 min      Past Medical History:  Diagnosis Date  . Arthritis     Past Surgical History:  Procedure Laterality Date  . ABDOMINAL HYSTERECTOMY  1995  . BUNIONECTOMY  2013   rt foot  . COLONOSCOPY    . MUSCLE BIOPSY Left 10/03/2012   Procedure: LEFT QUADRICEP MUSCLE BIOPSY;  Surgeon: Odis Hollingshead, MD;  Location: [place];  Service: General;  Laterality: Left;  . NECK SURGERY  2010   cerv disc fused     There were no vitals filed for this visit.      Subjective Assessment - 02/17/16 1344    Subjective My left knee is bothering me some.             [facility] PT Assessment - 02/17/16 0001      AROM   Lumbar Flexion 45   Lumbar - Right Side Bend 30   Lumbar - Left Side Bend 30     Berg Balance Test   Sit to Stand Able to stand without using hands and stabilize independently   Standing Unsupported Able to stand safely 2 minutes   Sitting with Back Unsupported but Feet Supported on Floor or Stool Able to sit safely and securely 2 minutes   Stand to Sit Sits safely with minimal use of hands   Transfers Able to transfer safely, definite need of hands   Standing Unsupported with Eyes Closed Able to stand 10 seconds safely   Standing Ubsupported with Feet Together Able to place feet together independently and stand 1 minute safely   From Standing, Reach Forward  with Outstretched Arm Can reach forward >12 cm safely (5")   From Standing Position, Pick up Object from Floor Able to pick up shoe safely and easily   From Standing Position, Turn to Look Behind Over each Shoulder Looks behind one side only/other side shows less weight shift   Turn 360 Degrees Able to turn 360 degrees safely but slowly   Standing Unsupported, Alternately Place Feet on Step/Stool Able to stand independently and complete 8 steps >20 seconds   Standing Unsupported, One Foot in Front Able to plae foot ahead of the other independently and hold 30 seconds   Standing on One Leg Able to lift leg independently and hold 5-10 seconds   Total Score 48                     [facility] Adult PT Treatment/Exercise - 02/17/16 0001      Lumbar Exercises: Aerobic   Stationary Bike Nustep L 6 , 10 minutes, legs only      Knee/Hip Exercises: Supine   Straight Leg Raises Strengthening;Right;10 reps;2 sets  verbal cues to keep core tight throughout exercise     Manual Therapy   Joint Mobilization long axis  distraction , A/P mobs grade 3                   PT Short Term Goals - 01/29/16 1210      PT SHORT TERM GOAL #1   Title pt will be I with basic HEP (02/01/2016)   Time 4   Period Weeks   Status Achieved     PT SHORT TERM GOAL #2   Title pt will be able to verbalize and demo techniques for proper DME use and gait mechanics to promote safety (02/01/2016)   Time 4   Period Weeks   Status Achieved     PT SHORT TERM GOAL #3   Title she will increase trunk mobility by >/= 5 degrees with trunk flexion/ side bending to promote funcitonal mobility (02/01/2016)   Time 4   Period Weeks   Status Achieved     PT SHORT TERM GOAL #4   Title she will improve bil hip exetensor / abductor strength to >/= 2+/5 on the R and 3/5 on the L for extension and >/= 2+/5 for abduction bil to promote functional strength (02/01/2016)   Time 4   Period Weeks   Status Achieved     PT  SHORT TERM GOAL #5   Title berg balance will be assessed and goals will be made based on assessment   Time 2   Period Weeks   Status Achieved           PT Long Term Goals - 02/17/16 1330      PT LONG TERM GOAL #1   Title pt will be I with all HEP given as of last visit (02/26/2016)   Time 8   Period Weeks   Status On-going     PT LONG TERM GOAL #2   Title increase trunk flexion by >/= 10 degrees and side bending by >/= 8 degrees without pt reporting pain or fear of falling or feeling unstable for functional mobility reqired for ADLs (02/26/2016)   Time 8   Period Weeks   Status Achieved     PT LONG TERM GOAL #3   Title pt will increase bil hip strength globally to >/= 4-/5 with </= 2/10 pain / burning for strength required for safety during walking/ standing activities (02/26/2016)   Time 8   Period Weeks   Status On-going     PT LONG TERM GOAL #4   Title she will be able to walk/ standing for >/= 30 min with </= 2/10 pain with LRAD for functional endurance required for ADLS (95/08/2128)   Baseline 1 hour to cook    Time 8   Period Weeks   Status Achieved     PT LONG TERM GOAL #5   Title she will increase her FOTO score to >/= 55% limitation to demonstrate improvement in function at discharge (02/26/2016)   Time 8   Period Weeks   Status Unable to assess     PT LONG TERM GOAL #6   Title she will improve her BERG balance to >/= 46 to demonstrate improvement in balance to promote safety (02/26/2016)   Baseline 48/56   Time 6   Period Weeks   Status Achieved               Plan - 02/17/16 1316    Clinical Impression Statement BERG 48/56. Walking and standing improved to 1 hour. Her AROM has improved and she feels stable. LTG# 2, #6 Met. She still  lacks hip strength, especially right hip flexion which impacts her gait mechanics.    PT Next Visit Plan   long axis distraciton mobs followed by hip strengthening; gait, balance    PT Home Exercise Plan sidelying clam  shell, seated calf raise, glute squeeze, SAQ, supine clam with red band( 01/09/2016) standing facing wall slides weight shifts and with hip flexion.       Patient will benefit from skilled therapeutic intervention in order to improve the following deficits and impairments:  Abnormal gait, Decreased endurance, Decreased activity tolerance, Decreased balance, Pain, Improper body mechanics, Postural dysfunction, Impaired sensation, Decreased strength, Hypomobility, Difficulty walking, Increased fascial restricitons, Decreased range of motion, Decreased knowledge of use of DME  Visit Diagnosis: Muscle weakness (generalized)  Other abnormalities of gait and mobility  Unsteadiness on feet  Other disturbances of skin sensation  Weakness of both legs     Problem List Patient Active Problem List   Diagnosis Date Noted  . Weakness 11/03/2012    Dorene Ar, PTA 02/17/2016, 1:45 PM  [place], [place], [address] Phone: [phone]   Fax:  [phone]  Name: [name] MRN: [id number] Date of Birth: 1945-06-12

## 2016-02-19 ENCOUNTER — Ambulatory Visit: Payer: Medicare Other | Admitting: Physical Therapy

## 2016-02-20 ENCOUNTER — Ambulatory Visit: Payer: Medicare Other | Admitting: Physical Therapy

## 2016-02-20 DIAGNOSIS — R2689 Other abnormalities of gait and mobility: Secondary | ICD-10-CM | POA: Diagnosis not present

## 2016-02-20 DIAGNOSIS — R29898 Other symptoms and signs involving the musculoskeletal system: Secondary | ICD-10-CM

## 2016-02-20 DIAGNOSIS — M6281 Muscle weakness (generalized): Secondary | ICD-10-CM | POA: Diagnosis not present

## 2016-02-20 DIAGNOSIS — R208 Other disturbances of skin sensation: Secondary | ICD-10-CM

## 2016-02-20 DIAGNOSIS — R269 Unspecified abnormalities of gait and mobility: Secondary | ICD-10-CM

## 2016-02-20 DIAGNOSIS — R2681 Unsteadiness on feet: Secondary | ICD-10-CM

## 2016-02-20 NOTE — Therapy (Signed)
[place], [place], [address] Phone: [phone]   Fax:  [phone]  Physical Therapy Treatment  Patient Details  Name: [name] MRN: [id number] Date of Birth: [birth year] Referring Provider: Luster Landsberg PA-C  Encounter Date: 02/20/2016      PT End of Session - 02/20/16 1425    Visit Number 14   Number of Visits 17   Date for PT Re-Evaluation 02/26/16   PT Start Time 1332   PT Stop Time 1415   PT Time Calculation (min) 43 min   Activity Tolerance Patient tolerated treatment well   Behavior During Therapy [facility] for tasks assessed/performed      Past Medical History:  Diagnosis Date  . Arthritis     Past Surgical History:  Procedure Laterality Date  . ABDOMINAL HYSTERECTOMY  1995  . BUNIONECTOMY  2013   rt foot  . COLONOSCOPY    . MUSCLE BIOPSY Left 10/03/2012   Procedure: LEFT QUADRICEP MUSCLE BIOPSY;  Surgeon: Odis Hollingshead, MD;  Location: [place];  Service: General;  Laterality: Left;  . NECK SURGERY  2010   cerv disc fused     There were no vitals filed for this visit.      Subjective Assessment - 02/20/16 1338    Subjective Left knee has the constant burning.  Able to walk this morning. about an hour/2 miles.  A nurse friend says I have fluid on my knee.    Currently in Pain? No/denies                         [facility] Adult PT Treatment/Exercise - 02/20/16 0001      Lumbar Exercises: Aerobic   Stationary Bike L1 5 minutes     Lumbar Exercises: Supine   Clam 10 reps  2 sets   Bent Knee Raise 10 reps  2 sets with abdomin bracing,  cues to do slowly.     Other Supine Lumbar Exercises 90/90 PTA holding LE lift into more flexion  then return to 90/90 10 X min assist right     Knee/Hip Exercises: Standing   Other Standing Knee Exercises Wall slides forward.  both hands sliding uo and weight on right ,  and Left arm uo with right knee  lift.  10 x each   Other Standing Knee Exercises in parallel bars retro walking with emphasis on quad activation     Knee/Hip Exercises: Supine   Bridges 10 reps  holding ball not dropping   Straight Leg Raises Strengthening  momentum   Other Supine Knee/Hip Exercises attempted to have patient flex knee after lifting into SLR, she was unable.     Manual Therapy   Joint Mobilization long axis distraction , A/P mobs grade 3   2 positions, 1 slightly abducted.                  PT Short Term Goals - 01/29/16 1210      PT SHORT TERM GOAL #1   Title pt will be I with basic HEP (02/01/2016)   Time 4   Period Weeks   Status Achieved     PT SHORT TERM GOAL #2   Title pt will be able to verbalize and demo techniques for proper DME use and gait mechanics to promote safety (02/01/2016)   Time 4   Period Weeks   Status Achieved     PT SHORT  TERM GOAL #3   Title she will increase trunk mobility by >/= 5 degrees with trunk flexion/ side bending to promote funcitonal mobility (02/01/2016)   Time 4   Period Weeks   Status Achieved     PT SHORT TERM GOAL #4   Title she will improve bil hip exetensor / abductor strength to >/= 2+/5 on the R and 3/5 on the L for extension and >/= 2+/5 for abduction bil to promote functional strength (02/01/2016)   Time 4   Period Weeks   Status Achieved     PT SHORT TERM GOAL #5   Title berg balance will be assessed and goals will be made based on assessment   Time 2   Period Weeks   Status Achieved           PT Long Term Goals - 02/17/16 1330      PT LONG TERM GOAL #1   Title pt will be I with all HEP given as of last visit (02/26/2016)   Time 8   Period Weeks   Status On-going     PT LONG TERM GOAL #2   Title increase trunk flexion by >/= 10 degrees and side bending by >/= 8 degrees without pt reporting pain or fear of falling or feeling unstable for functional mobility reqired for ADLs (02/26/2016)   Time 8   Period Weeks    Status Achieved     PT LONG TERM GOAL #3   Title pt will increase bil hip strength globally to >/= 4-/5 with </= 2/10 pain / burning for strength required for safety during walking/ standing activities (02/26/2016)   Time 8   Period Weeks   Status On-going     PT LONG TERM GOAL #4   Title she will be able to walk/ standing for >/= 30 min with </= 2/10 pain with LRAD for functional endurance required for ADLS ([id number])   Baseline 1 hour to cook    Time 8   Period Weeks   Status Achieved     PT LONG TERM GOAL #5   Title she will increase her FOTO score to >/= 55% limitation to demonstrate improvement in function at discharge (02/26/2016)   Time 8   Period Weeks   Status Unable to assess     PT LONG TERM GOAL #6   Title she will improve her BERG balance to >/= 46 to demonstrate improvement in balance to promote safety (02/26/2016)   Baseline 48/56   Time 6   Period Weeks   Status Achieved               Plan - 02/20/16 1426    Clinical Impression Statement Continue strengthening today.  She is using extension pattern to straighten knee with SLR and was unable to break it to bend knee while leg was lifted.  Patient's gait and balance is noticibly improved since  last seen by me.   PT Next Visit Plan   long axis distraciton mobs followed by hip strengthening; gait, balance    PT Home Exercise Plan sidelying clam shell, seated calf raise, glute squeeze, SAQ, supine clam with red band( 01/09/2016) standing facing wall slides weight shifts and with hip flexion.    Consulted and Agree with Plan of Care Patient      Patient will benefit from skilled therapeutic intervention in order to improve the following deficits and impairments:  Abnormal gait, Decreased endurance, Decreased activity tolerance, Decreased balance, Pain, Improper body mechanics,  Postural dysfunction, Impaired sensation, Decreased strength, Hypomobility, Difficulty walking, Increased fascial restricitons, Decreased  range of motion, Decreased knowledge of use of DME  Visit Diagnosis: Muscle weakness (generalized)  Other abnormalities of gait and mobility  Unsteadiness on feet  Other disturbances of skin sensation  Weakness of both legs  Abnormality of gait     Problem List Patient Active Problem List   Diagnosis Date Noted  . Weakness 11/03/2012    Letrell Attwood PTA 02/20/2016, 2:34 PM  [facility] [address] [place], [place], [address] Phone: [phone]   Fax:  [phone]  Name: [name] MRN: [id number] Date of Birth: Mar 07, 1946

## 2016-02-24 ENCOUNTER — Ambulatory Visit: Payer: Medicare Other | Admitting: Physical Therapy

## 2016-02-26 ENCOUNTER — Encounter: Payer: Self-pay | Admitting: Physical Therapy

## 2016-02-26 ENCOUNTER — Ambulatory Visit: Payer: Medicare Other | Attending: Orthopedic Surgery | Admitting: Physical Therapy

## 2016-02-26 DIAGNOSIS — R269 Unspecified abnormalities of gait and mobility: Secondary | ICD-10-CM | POA: Diagnosis not present

## 2016-02-26 DIAGNOSIS — M545 Low back pain, unspecified: Secondary | ICD-10-CM

## 2016-02-26 DIAGNOSIS — R29898 Other symptoms and signs involving the musculoskeletal system: Secondary | ICD-10-CM | POA: Insufficient documentation

## 2016-02-26 DIAGNOSIS — M5386 Other specified dorsopathies, lumbar region: Secondary | ICD-10-CM

## 2016-02-26 DIAGNOSIS — R2681 Unsteadiness on feet: Secondary | ICD-10-CM | POA: Diagnosis not present

## 2016-02-26 DIAGNOSIS — M6281 Muscle weakness (generalized): Secondary | ICD-10-CM

## 2016-02-26 DIAGNOSIS — M2560 Stiffness of unspecified joint, not elsewhere classified: Secondary | ICD-10-CM

## 2016-02-26 NOTE — Therapy (Signed)
[place], [place], [address] Phone: [phone]   Fax:  [phone]  Physical Therapy Treatment  Patient Details  Name: [name] MRN: [id number] Date of Birth: 05/16/1945 Referring Provider: Luster Landsberg PA-C  Encounter Date: 02/26/2016      PT End of Session - 02/26/16 1452    Visit Number 15   Number of Visits 17   Date for PT Re-Evaluation 03/11/16   Authorization Type Medicare: Kx mod by 15th visit, Progress note by 18th visit.    PT Start Time 1345   PT Stop Time 1435   PT Time Calculation (min) 50 min   Activity Tolerance Patient tolerated treatment well   Behavior During Therapy WFL for tasks assessed/performed      Past Medical History:  Diagnosis Date  . Arthritis     Past Surgical History:  Procedure Laterality Date  . ABDOMINAL HYSTERECTOMY  1995  . BUNIONECTOMY  2013   rt foot  . COLONOSCOPY    . MUSCLE BIOPSY Left 10/03/2012   Procedure: LEFT QUADRICEP MUSCLE BIOPSY;  Surgeon: Odis Hollingshead, MD;  Location: [place];  Service: General;  Laterality: Left;  . NECK SURGERY  2010   cerv disc fused     There were no vitals filed for this visit.      Subjective Assessment - 02/26/16 1443    Subjective Pt with no complaints of pain upon entry only weakness and occassional burning noted in her left knee at times.    Limitations Standing;Lifting;Walking   How long can you sit comfortably? 30 minutes   How long can you stand comfortably? 5-10 min   How long can you walk comfortably? 5-10 min with [facility]   Diagnostic tests MRI / x-ray, increased arthritis in the spine   Patient Stated Goals get mobility back, increase strength, increase safety    Currently in Pain? No/denies                         [facility] Adult PT Treatment/Exercise - 02/26/16 0001      Lumbar Exercises: Aerobic   Stationary Bike L1 5 minutes     Lumbar Exercises:  Supine   Clam 10 reps  red theraband 2 sets   Bent Knee Raise 15 reps   Bent Knee Raise Limitations difficulty lifting R LE    Other Supine Lumbar Exercises hip/trunk rotation single leg, holding 20-30 seconds x 3 on each LE, SAQ x 10 on R LE, Hip ABD stretch passively holding 30 seconds x 3 reps.    Other Supine Lumbar Exercises 90/90 PTA holding LE lift into more flexion  then return to 90/90 10 X min assist right     Knee/Hip Exercises: Supine   Bridges 10 reps  holding ball not dropping     Manual Therapy   Joint Mobilization long axis distraction , A/P mobs grade 3   2 positions, 1 slightly abducted.                PT Education - 02/26/16 1450    Education Details Instructed pt in SAQ for R LE strengthening, and reviewed safety when walking and awareness of surrounding area and surface changes.    Person(s) Educated Patient   Methods Explanation;Demonstration   Comprehension Verbalized understanding;Returned demonstration          PT Short Term Goals - 02/26/16 1458      PT  SHORT TERM GOAL #1   Title pt will be I with basic HEP (02/01/2016)   Status Achieved     PT SHORT TERM GOAL #2   Title pt will be able to verbalize and demo techniques for proper DME use and gait mechanics to promote safety (02/01/2016)   Status Achieved     PT SHORT TERM GOAL #3   Title she will increase trunk mobility by >/= 5 degrees with trunk flexion/ side bending to promote funcitonal mobility (02/01/2016)   Status Achieved     PT SHORT TERM GOAL #4   Title she will improve bil hip exetensor / abductor strength to >/= 2+/5 on the R and 3/5 on the L for extension and >/= 2+/5 for abduction bil to promote functional strength (02/01/2016)   Status Achieved     PT SHORT TERM GOAL #5   Title berg balance will be assessed and goals will be made based on assessment   Status Achieved           PT Long Term Goals - 02/26/16 1459      PT LONG TERM GOAL #1   Title pt will be I  with all HEP given as of last visit (02/26/2016)   Time 10   Period Weeks   Status On-going     PT LONG TERM GOAL #2   Title increase trunk flexion by >/= 10 degrees and side bending by >/= 8 degrees without pt reporting pain or fear of falling or feeling unstable for functional mobility reqired for ADLs (02/26/2016)   Status Achieved     PT LONG TERM GOAL #3   Title pt will increase bil hip strength globally to >/= 4-/5 with </= 2/10 pain / burning for strength required for safety during walking/ standing activities (02/26/2016)   Status On-going     PT LONG TERM GOAL #4   Title she will be able to walk/ standing for >/= 30 min with </= 2/10 pain with LRAD for functional endurance required for ADLS (47/06/2593)   Baseline 1 hour to cook    Status Achieved     PT LONG TERM GOAL #5   Title she will increase her FOTO score to >/= 55% limitation to demonstrate improvement in function at discharge (02/26/2016)   Status Unable to assess     PT LONG TERM GOAL #6   Title she will improve her BERG balance to >/= 46 to demonstrate improvement in balance to promote safety (02/26/2016)   Status Achieved               Plan - 02/26/16 1500    Clinical Impression Statement Pt to continue for 2 additional weeks of therapy to continue to progress pt's HEP independence, and gait training. Pt has met all her STG's and is still progressing toward two of  her LTG's. Pt reporting improvements since beginning therapy in her pain, strength and gait.    Rehab Potential Good   PT Frequency 2x / week   PT Duration 2 weeks   PT Next Visit Plan  FOTO, long axis distraciton mobs followed by hip strengthening; gait, balance    PT Home Exercise Plan sidelying clam shell, seated calf raise, glute squeeze, SAQ, supine clam with red band( 01/09/2016) standing facing wall slides weight shifts and with hip flexion.    Consulted and Agree with Plan of Care Patient      Patient will benefit from skilled  therapeutic intervention in order to improve  the following deficits and impairments:     Visit Diagnosis: Muscle weakness (generalized)  Unsteadiness on feet  Weakness of both legs  Abnormality of gait  Joint stiffness of spine  Decreased ROM of lumbar spine  Bilateral low back pain without sciatica, unspecified chronicity     Problem List Patient Active Problem List   Diagnosis Date Noted  . Weakness 11/03/2012    Oretha Caprice, MPT 02/26/2016, 3:05 PM  [facility] [address] [place], [place], [address] Phone: [phone]   Fax:  [phone]  Name: SAVONNA BIRCHMEIER MRN: [id number] Date of Birth: 27-May-1945

## 2016-03-02 ENCOUNTER — Ambulatory Visit: Payer: Medicare Other | Admitting: Physical Therapy

## 2016-03-04 ENCOUNTER — Ambulatory Visit: Payer: Medicare Other | Admitting: Physical Therapy

## 2016-03-09 DIAGNOSIS — R413 Other amnesia: Secondary | ICD-10-CM | POA: Diagnosis not present

## 2016-03-09 DIAGNOSIS — K5901 Slow transit constipation: Secondary | ICD-10-CM | POA: Diagnosis not present

## 2016-03-10 ENCOUNTER — Ambulatory Visit: Payer: Medicare Other | Admitting: Physical Therapy

## 2016-03-10 DIAGNOSIS — R29898 Other symptoms and signs involving the musculoskeletal system: Secondary | ICD-10-CM | POA: Diagnosis not present

## 2016-03-10 DIAGNOSIS — M545 Low back pain: Secondary | ICD-10-CM | POA: Diagnosis not present

## 2016-03-10 DIAGNOSIS — R269 Unspecified abnormalities of gait and mobility: Secondary | ICD-10-CM | POA: Diagnosis not present

## 2016-03-10 DIAGNOSIS — M2560 Stiffness of unspecified joint, not elsewhere classified: Secondary | ICD-10-CM | POA: Diagnosis not present

## 2016-03-10 DIAGNOSIS — M6281 Muscle weakness (generalized): Secondary | ICD-10-CM

## 2016-03-10 DIAGNOSIS — R2681 Unsteadiness on feet: Secondary | ICD-10-CM | POA: Diagnosis not present

## 2016-03-10 DIAGNOSIS — L710 Perioral dermatitis: Secondary | ICD-10-CM | POA: Diagnosis not present

## 2016-03-10 NOTE — Therapy (Signed)
[place], [place], [address] Phone: [phone]   Fax:  [phone]  Physical Therapy Treatment / Discharge Note  Patient Details  Name: [name] MRN: [id number] Date of Birth: [birth year] Referring Provider: Luster Landsberg PA-C  Encounter Date: 03/10/2016      PT End of Session - 03/10/16 1133    Visit Number 16   Number of Visits 17   Date for PT Re-Evaluation 03/13/16   Authorization Type Medicare: Kx mod by 15th visit, Progress note by 18th visit.    PT Start Time 1111   PT Stop Time 1145   PT Time Calculation (min) 34 min   Activity Tolerance Patient tolerated treatment well   Behavior During Therapy WFL for tasks assessed/performed      Past Medical History:  Diagnosis Date  . Arthritis     Past Surgical History:  Procedure Laterality Date  . ABDOMINAL HYSTERECTOMY  1995  . BUNIONECTOMY  2013   rt foot  . COLONOSCOPY    . MUSCLE BIOPSY Left 10/03/2012   Procedure: LEFT QUADRICEP MUSCLE BIOPSY;  Surgeon: Odis Hollingshead, MD;  Location: [facility];  Service: General;  Laterality: Left;  . NECK SURGERY  2010   cerv disc fused     There were no vitals filed for this visit.      Subjective Assessment - 03/10/16 1114    Subjective "I am feel like I am tired of PT due to fluctuating with function"    Currently in Pain? No/denies   Pain Score 0-No pain            [facility] PT Assessment - 03/10/16 0001      Observation/Other Assessments   Focus on Therapeutic Outcomes (FOTO)  42 % limited     AROM   Lumbar Flexion 50   Lumbar Extension 23   Lumbar - Right Side Bend 32   Lumbar - Left Side Bend 31     Strength   Right Hip Flexion 3/5   Right Hip Extension 3+/5   Right Hip ABduction 3/5   Right Hip ADduction 4-/5   Left Hip Flexion 5/5   Left Hip Extension 4-/5   Left Hip ABduction 4-/5   Left Hip ADduction 4-/5                      [facility] Adult PT Treatment/Exercise - 03/10/16 0001      Lumbar Exercises: Aerobic   Stationary Bike --   Tread Mill 2 min at .5, 2 min at . 7, 1 min at 1.0 MPH with cool dow at 1 min at . 4 MPH totaling 6 min     Lumbar Exercises: Seated   Long Arc Quad on Chair Strengthening;Right;2 sets;10 reps   Hip Flexion on [employer] Strengthening;Right;10 reps  from table                PT Education - 03/10/16 1252    Education provided Yes   Education Details how to continue with HEP making a workout regiment and importance of consistney to conitnue to promote funcitonal improvement. using a tredmill and how to be safe with it.    Person(s) Educated Patient   Methods Explanation;Verbal cues   Comprehension Verbalized understanding;Verbal cues required          PT Short Term Goals - 03/10/16 1141      PT SHORT TERM GOAL #1   Title  pt will be I with basic HEP (02/01/2016)   Time 4   Period Weeks   Status Achieved     PT SHORT TERM GOAL #2   Title pt will be able to verbalize and demo techniques for proper DME use and gait mechanics to promote safety (02/01/2016)   Time 4   Period Weeks   Status Achieved     PT SHORT TERM GOAL #3   Title she will increase trunk mobility by >/= 5 degrees with trunk flexion/ side bending to promote funcitonal mobility (02/01/2016)   Time 4   Period Weeks   Status Achieved     PT SHORT TERM GOAL #4   Title she will improve bil hip exetensor / abductor strength to >/= 2+/5 on the R and 3/5 on the L for extension and >/= 2+/5 for abduction bil to promote functional strength (02/01/2016)   Time 4   Period Weeks   Status Achieved     PT SHORT TERM GOAL #5   Title berg balance will be assessed and goals will be made based on assessment   Time 2   Period Weeks   Status Achieved           PT Long Term Goals - 03/10/16 1142      PT LONG TERM GOAL #1   Title pt will be I with all HEP given as of last visit (02/26/2016)   Time 10   Period  Weeks   Status Achieved     PT LONG TERM GOAL #2   Title increase trunk flexion by >/= 10 degrees and side bending by >/= 8 degrees without pt reporting pain or fear of falling or feeling unstable for functional mobility reqired for ADLs (02/26/2016)   Time 8   Period Weeks   Status Achieved     PT LONG TERM GOAL #3   Title pt will increase bil hip strength globally to >/= 4-/5 with </= 2/10 pain / burning for strength required for safety during walking/ standing activities (02/26/2016)   Time 8   Period Weeks   Status Partially Met     PT LONG TERM GOAL #4   Title she will be able to walk/ standing for >/= 30 min with </= 2/10 pain with LRAD for functional endurance required for ADLS (20/11/4707)   Time 8   Period Weeks   Status Achieved     PT LONG TERM GOAL #5   Title she will increase her FOTO score to >/= 55% limitation to demonstrate improvement in function at discharge (02/26/2016)   Time 8   Period Weeks   Status Achieved     PT LONG TERM GOAL #6   Title she will improve her BERG balance to >/= 46 to demonstrate improvement in balance to promote safety (02/26/2016)   Time 6   Period Weeks   Status Achieved               Plan - 03/10/16 1253    Clinical Impression Statement [name] has made great progress with physical therapy. She has improved her trunk mobility and RLE strength, as well as her endurnace. Additonally she is pain free. she was able to walk on the tredmill with supervision safetly. she met or partially met all goals this visit and is able to maintain and progress her current level of funciton independently and will be discharged from PT today.    PT Next Visit Plan D/C   Consulted and  Agree with Plan of Care Patient      Patient will benefit from skilled therapeutic intervention in order to improve the following deficits and impairments:  Abnormal gait, Decreased endurance, Decreased activity tolerance, Decreased balance, Pain, Improper body  mechanics, Postural dysfunction, Impaired sensation, Decreased strength, Hypomobility, Difficulty walking, Increased fascial restricitons, Decreased range of motion, Decreased knowledge of use of DME  Visit Diagnosis: Muscle weakness (generalized)  Unsteadiness on feet  Weakness of both legs  Abnormality of gait       G-Codes - 2016/04/01 1255    Functional Assessment Tool Used FOTO/ clinical judgement   Functional Limitation Mobility: Walking and moving around   Mobility: Walking and Moving Around Goal Status [phone]) At least 40 percent but less than 60 percent impaired, limited or restricted   Mobility: Walking and Moving Around Discharge Status [phone]) At least 40 percent but less than 60 percent impaired, limited or restricted      Problem List Patient Active Problem List   Diagnosis Date Noted  . Weakness 11/03/2012   Starr Lake PT, DPT, LAT, ATC  01-Apr-2016  12:58 PM      [facility] [address] [place], [place], [address] Phone: [phone]   Fax:  [phone]  Name: [name] MRN: [id number] Date of Birth: 1945-05-18   PHYSICAL THERAPY DISCHARGE SUMMARY  Visits from Start of Care: 16  Current functional level related to goals / functional outcomes: FOTO 42% limited   Remaining deficits: Weakness noted in the RLE compared bil. Abnormality of gait requiring SPC for safety with bil postural sway.    Education / Equipment: HEP, theraband, posture, gait training,   Plan: Patient agrees to discharge.  Patient goals were partially met. Patient is being discharged due to being pleased with the current functional level.  ?????

## 2016-03-11 ENCOUNTER — Ambulatory Visit: Payer: Medicare Other | Admitting: Physical Therapy

## 2016-03-25 DIAGNOSIS — E041 Nontoxic single thyroid nodule: Secondary | ICD-10-CM | POA: Diagnosis not present

## 2016-03-25 DIAGNOSIS — R413 Other amnesia: Secondary | ICD-10-CM | POA: Diagnosis not present

## 2016-03-25 DIAGNOSIS — K5901 Slow transit constipation: Secondary | ICD-10-CM | POA: Diagnosis not present

## 2016-03-26 DIAGNOSIS — Z981 Arthrodesis status: Secondary | ICD-10-CM | POA: Diagnosis not present

## 2016-03-26 DIAGNOSIS — R29898 Other symptoms and signs involving the musculoskeletal system: Secondary | ICD-10-CM | POA: Diagnosis not present

## 2016-03-26 DIAGNOSIS — M5136 Other intervertebral disc degeneration, lumbar region: Secondary | ICD-10-CM | POA: Diagnosis not present

## 2016-04-01 DIAGNOSIS — G959 Disease of spinal cord, unspecified: Secondary | ICD-10-CM | POA: Diagnosis not present

## 2016-04-01 DIAGNOSIS — F329 Major depressive disorder, single episode, unspecified: Secondary | ICD-10-CM | POA: Diagnosis not present

## 2016-04-01 DIAGNOSIS — R410 Disorientation, unspecified: Secondary | ICD-10-CM | POA: Diagnosis not present

## 2016-04-01 DIAGNOSIS — K5901 Slow transit constipation: Secondary | ICD-10-CM | POA: Diagnosis not present

## 2016-04-10 DIAGNOSIS — M12271 Villonodular synovitis (pigmented), right ankle and foot: Secondary | ICD-10-CM | POA: Diagnosis not present

## 2016-04-10 DIAGNOSIS — M65871 Other synovitis and tenosynovitis, right ankle and foot: Secondary | ICD-10-CM | POA: Diagnosis not present

## 2016-04-10 DIAGNOSIS — M79671 Pain in right foot: Secondary | ICD-10-CM | POA: Diagnosis not present

## 2016-04-10 DIAGNOSIS — M25571 Pain in right ankle and joints of right foot: Secondary | ICD-10-CM | POA: Diagnosis not present

## 2016-04-27 DIAGNOSIS — H04123 Dry eye syndrome of bilateral lacrimal glands: Secondary | ICD-10-CM | POA: Diagnosis not present

## 2016-04-27 DIAGNOSIS — H2513 Age-related nuclear cataract, bilateral: Secondary | ICD-10-CM | POA: Diagnosis not present

## 2016-04-27 DIAGNOSIS — H40023 Open angle with borderline findings, high risk, bilateral: Secondary | ICD-10-CM | POA: Diagnosis not present

## 2016-04-27 DIAGNOSIS — H25013 Cortical age-related cataract, bilateral: Secondary | ICD-10-CM | POA: Diagnosis not present

## 2016-04-29 DIAGNOSIS — M25571 Pain in right ankle and joints of right foot: Secondary | ICD-10-CM | POA: Diagnosis not present

## 2016-04-29 DIAGNOSIS — M65871 Other synovitis and tenosynovitis, right ankle and foot: Secondary | ICD-10-CM | POA: Diagnosis not present

## 2016-05-20 DIAGNOSIS — M65871 Other synovitis and tenosynovitis, right ankle and foot: Secondary | ICD-10-CM | POA: Diagnosis not present

## 2016-05-20 DIAGNOSIS — M25571 Pain in right ankle and joints of right foot: Secondary | ICD-10-CM | POA: Diagnosis not present

## 2016-07-01 DIAGNOSIS — M25571 Pain in right ankle and joints of right foot: Secondary | ICD-10-CM | POA: Diagnosis not present

## 2016-07-01 DIAGNOSIS — M65871 Other synovitis and tenosynovitis, right ankle and foot: Secondary | ICD-10-CM | POA: Diagnosis not present

## 2016-07-13 ENCOUNTER — Ambulatory Visit: Payer: Medicare Other

## 2016-07-29 ENCOUNTER — Ambulatory Visit: Payer: Medicare Other | Attending: Physician Assistant

## 2016-07-29 DIAGNOSIS — M79604 Pain in right leg: Secondary | ICD-10-CM | POA: Diagnosis not present

## 2016-07-29 DIAGNOSIS — M25561 Pain in right knee: Secondary | ICD-10-CM | POA: Diagnosis not present

## 2016-07-29 DIAGNOSIS — M25562 Pain in left knee: Secondary | ICD-10-CM | POA: Insufficient documentation

## 2016-07-29 DIAGNOSIS — M79605 Pain in left leg: Secondary | ICD-10-CM | POA: Diagnosis not present

## 2016-07-29 DIAGNOSIS — R262 Difficulty in walking, not elsewhere classified: Secondary | ICD-10-CM | POA: Diagnosis not present

## 2016-07-29 DIAGNOSIS — M6281 Muscle weakness (generalized): Secondary | ICD-10-CM | POA: Diagnosis not present

## 2016-07-29 NOTE — Therapy (Signed)
[facility] [address], [place], [address] Phone: [phone]   Fax:  [phone]  Physical Therapy Evaluation  Patient Details  Name: [name] MRN: [id number] Date of Birth: Nov 22, 1945 Referring Provider: Shary Decamp, [place]  Encounter Date: 07/29/2016      PT End of Session - 07/29/16 1435    Visit Number 1   Number of Visits 17   Date for PT Re-Evaluation 09/24/16   Authorization Type 1   Authorization Time Period of 10 g code   PT Start Time 1435   PT Stop Time 7867   PT Time Calculation (min) 52 min   Activity Tolerance Patient tolerated treatment well      Past Medical History:  Diagnosis Date  . Arthritis   . Depression     Past Surgical History:  Procedure Laterality Date  . ABDOMINAL HYSTERECTOMY  1995  . back sugery    . BUNIONECTOMY  2013   rt foot  . COLONOSCOPY    . MUSCLE BIOPSY Left 10/03/2012   Procedure: LEFT QUADRICEP MUSCLE BIOPSY;  Surgeon: Odis Hollingshead, MD;  Location: [facility];  Service: General;  Laterality: Left;  . NECK SURGERY  2010   cerv disc fused     There were no vitals filed for this visit.       Subjective Assessment - 07/29/16 1444    Subjective Bilateral knee and leg pain: 5/10 currently (pt sitting on chair), 8/10 at worst for the past month. Symptoms began since her neck surgery.    Pertinent History LE weakness.  Symptoms occured suddenly, unknown method of injury prior to her first neck fusion surgery on January 2010. Had lower back surgery fusion in 2011.  The neck and back surgeries did not help. Pt states having increased urinary urgency and takes medication for for it. MD aware.  Denies saddle anesthesia.  Pt states that her doctor told her that PT is the only thing that is going to help her keep moving so she continues to participate in PT.  Last round of PT was last year which helped.  Currently has difficulty walking,  performing chores (wash dishes, laundry), cooking. Better able to do her tasks a little bit when she does therapy but gets harder when she stops.  Feels burning and stinging in both her knees, and bilateral anterior and lateral legs.  Pt states not having back or neck pain. Just a stiff neck.  Pt states she usually walks with a cane on her R side. No falls within the last 6 months.     Patient Stated Goals Be better able to walk, and get around better without the stinging and burning in her knees and legs.    Currently in Pain? Yes   Pain Score 5    Pain Location --  bilateral knees and legs   Pain Orientation Right;Left   Pain Descriptors / Indicators Burning  and stinging   Pain Type Chronic pain   Pain Onset More than a month ago   Pain Frequency Constant   Aggravating Factors  walking about 30 min   Pain Relieving Factors sitting and resting            [facility] PT Assessment - 07/29/16 1438      Assessment   Medical Diagnosis Weakness of LE, unspecified laterality   Referring Provider Shary Decamp, PA   Onset Date/Surgical Date 03/26/16  Date PT  referral signed. Chronic condition.   Prior Therapy Pt participated in previous PT which helped her function at home.      Precautions   Precaution Comments No known precautions     Restrictions   Other Position/Activity Restrictions No known restrictions     Balance Screen   Has the patient fallen in the past 6 months No   Has the patient had a decrease in activity level because of a fear of falling?  No  pt states fear of falling   Is the patient reluctant to leave their home because of a fear of falling?  No  pt states fear of falling     Home Environment   Additional Comments Pt lives in a 1 story home with her husband, 2 steps to enter, no rail      Prior Function   Vocation Requirements PLOF: less difficulty walking, performing functional tasks at home.      Observation/Other Assessments   Observations Pt states  feeling heat, and stinging sensation L leg. No warmth palpated.  No L LE swelling. L leg looks equal to the R.    Lower Extremity Functional Scale  14/80     Posture/Postural Control   Posture Comments R lateral lean, decreased bilateral hip extension      Strength   Overall Strength Comments seated manually resisted hip extension:  4-/5 R, 4/5 L    Right Hip Flexion 2/5   Right Hip ABduction 4-/5  seated clamshell position, hips less than 90 degrees flexion   Left Hip Flexion 3-/5   Left Hip ABduction 4/5  seated clamshell position, hips less than 90 degrees flexion   Right Knee Flexion 3+/5   Right Knee Extension 4-/5   Left Knee Flexion 4-/5   Left Knee Extension 4+/5   Right Ankle Dorsiflexion 3+/5   Left Ankle Dorsiflexion 4/5     Ambulation/Gait   Gait Comments Ambulates with SPC on R side, antalgic, decreased stance R LE, decreased R LE clearance, decreased R LE swing phase, lateral lean during stance phase bilaterally. Pt states that she usually uses her SPC on her R side. Also has a hard time using her SPC in L side due to L wrist pain.          Objectives  There-ex   Seated manually resisted hip extension with PT resistance 10x each LE   Seated hip flexion R 10x2 with PT assist   Seated manually resisted clam shells 10x 2 seconds   Improved exercise technique, movement at target joints, use of target muscles after mod verbal, visual, tactile cues.                  PT Education - 07/29/16 1810    Education provided Yes   Education Details ther-ex, plan of care   Person(s) Educated Patient   Methods Explanation;Demonstration;Tactile cues;Verbal cues   Comprehension Returned demonstration;Verbalized understanding             PT Long Term Goals - 07/29/16 1742      PT LONG TERM GOAL #1   Title Pt will be independent with her HEP to promote LE strength in function.   Time 8   Period Weeks   Status New     PT LONG TERM GOAL #2   Title  Patient will improve bilateral LE strength by at least 1/2 MMT grade to promote ability to ambulate and perform functional tasks.    Time 8   Period  Weeks   Status New     PT LONG TERM GOAL #3   Title Patient will improve her LEFS score by at least 9 points as a demonstration of improved function.    Baseline 14/80 (07/29/2016)   Time 8   Period Weeks   Status New     PT LONG TERM GOAL #4   Title Pt will report being able to walk/stand with SPC over 35 min to promote mobility.    Baseline Pt states being able to walk for about 30 min (07/29/2016)   Time 8   Period Weeks   Status New     PT LONG TERM GOAL #5   Title Patient will have a decrease in bilateral LE pain to 5/10 or less at worst to promote ability to perform functional tasks.    Baseline 8/10 at worst (07/29/2016)   Time 8   Period Weeks   Status New               Plan - 07/29/16 1731    Clinical Impression Statement Patient is a 71 year old female who came to physical therapy secondary to bilateral LE weakness. She also presents with decreased trunk control, altered gait pattern and posture, difficulty lifting her R LE, and difficulty performing functional tasks such as walking, and chores at home. Patient will benefit from skilled physical therapy services to address the aforementioned deficits.    Rehab Potential Fair   Clinical Impairments Affecting Rehab Potential Chronicity of condition   PT Frequency 2x / week   PT Duration 8 weeks   PT Treatment/Interventions Electrical Stimulation;Aquatic Therapy;Ultrasound;Gait training;Functional mobility training;Therapeutic activities;Therapeutic exercise;Balance training;Neuromuscular re-education;Patient/family education;Manual techniques;Dry needling   PT Next Visit Plan LE strengthening, modalities PRN   Consulted and Agree with Plan of Care Patient      Patient will benefit from skilled therapeutic intervention in order to improve the following deficits and  impairments:  Pain, Abnormal gait, Decreased balance, Decreased range of motion, Decreased strength, Difficulty walking  Visit Diagnosis: Muscle weakness (generalized) - Plan: PT plan of care cert/re-cert  Difficulty in walking, not elsewhere classified - Plan: PT plan of care cert/re-cert  Pain in left leg - Plan: PT plan of care cert/re-cert  Pain in right leg - Plan: PT plan of care cert/re-cert  Right knee pain, unspecified chronicity - Plan: PT plan of care cert/re-cert  Left knee pain, unspecified chronicity - Plan: PT plan of care cert/re-cert      G-Codes - 09/98/33 1753    Functional Assessment Tool Used (Outpatient Only) LEFS, clinical presentation, patient interview   Functional Limitation Mobility: Walking and moving around   Mobility: Walking and Moving Around Current Status (A2505) At least 80 percent but less than 100 percent impaired, limited or restricted   Mobility: Walking and Moving Around Goal Status [phone]) At least 60 percent but less than 80 percent impaired, limited or restricted       Problem List Patient Active Problem List   Diagnosis Date Noted  . Weakness 11/03/2012    [name] PT, DPT   07/29/2016, 6:16 PM  [facility] [address], [place], [address] Phone: [phone]   Fax:  [phone]  Name: [name] MRN: [id number] Date of Birth: 17-Mar-1946

## 2016-08-04 ENCOUNTER — Ambulatory Visit: Payer: Medicare Other

## 2016-08-04 DIAGNOSIS — M6281 Muscle weakness (generalized): Secondary | ICD-10-CM

## 2016-08-04 DIAGNOSIS — R262 Difficulty in walking, not elsewhere classified: Secondary | ICD-10-CM

## 2016-08-04 DIAGNOSIS — M25561 Pain in right knee: Secondary | ICD-10-CM

## 2016-08-04 DIAGNOSIS — M25562 Pain in left knee: Secondary | ICD-10-CM | POA: Diagnosis not present

## 2016-08-04 DIAGNOSIS — M79604 Pain in right leg: Secondary | ICD-10-CM | POA: Diagnosis not present

## 2016-08-04 DIAGNOSIS — M79605 Pain in left leg: Secondary | ICD-10-CM | POA: Diagnosis not present

## 2016-08-04 NOTE — Therapy (Signed)
[facility] [address] [place], [address] Phone: [phone]   Fax:  [phone]  Physical Therapy Treatment  Patient Details  Name: [name] MRN: [id number] Date of Birth: 21-Jun-1945 Referring Provider: Shary Decamp, [place]  Encounter Date: 08/04/2016      PT End of Session - 08/04/16 1353    Visit Number 2   Number of Visits 17   Date for PT Re-Evaluation 09/24/16   Authorization Type 2   Authorization Time Period of 10 g code   PT Start Time 6962   PT Stop Time 1436   PT Time Calculation (min) 43 min   Activity Tolerance Patient tolerated treatment well      Past Medical History:  Diagnosis Date  . Arthritis   . Depression     Past Surgical History:  Procedure Laterality Date  . ABDOMINAL HYSTERECTOMY  1995  . back sugery    . BUNIONECTOMY  2013   rt foot  . COLONOSCOPY    . MUSCLE BIOPSY Left 10/03/2012   Procedure: LEFT QUADRICEP MUSCLE BIOPSY;  Surgeon: Odis Hollingshead, MD;  Location: [facility];  Service: General;  Laterality: Left;  . NECK SURGERY  2010   cerv disc fused     There were no vitals filed for this visit.      Subjective Assessment - 08/04/16 1354    Subjective Both knees feel stiff and burning today. 8/10 bilateral knee and legs   Pertinent History LE weakness.  Symptoms occured suddenly, unknown method of injury prior to her first neck fusion surgery on January 2010. Had lower back surgery fusion in 2011.  The neck and back surgeries did not help. Pt states having increased urinary urgency and takes medication for for it. MD aware.  Denies saddle anesthesia.  Pt states that her doctor told her that PT is the only thing that is going to help her keep moving so she continues to participate in PT.  Last round of PT was last year which helped.  Currently has difficulty walking, performing chores (wash dishes, laundry), cooking. Better able to do her  tasks a little bit when she does therapy but gets harder when she stops.  Feels burning and stinging in both her knees, and bilateral anterior and lateral legs.  Pt states not having back or neck pain. Just a stiff neck.  Pt states she usually walks with a cane on her R side. No falls within the last 6 months.     Patient Stated Goals Be better able to walk, and get around better without the stinging and burning in her knees and legs.    Currently in Pain? Yes   Pain Score 8    Pain Onset More than a month ago                                 PT Education - 08/04/16 1357    Education provided Yes   Education Details ther-ex   [employer]) Educated Patient   Methods Explanation;Demonstration;Tactile cues;Verbal cues   Comprehension Returned demonstration;Verbalized understanding        Objectives  There-ex  Seated assisted R hip flexion 10x Seated L hip flexion 10x Seated hip adduction ball squeeze with glute max squeeze 10x5 seconds. Decreased bilateral LE symptoms.  Reviewed and given as part of her HEP. Pt demonstrated and verbalized understanding.  Improved exercise technique, movement at target joints, use of target muscles after min to mod verbal, visual, tactile cues.         Manual therapy  Seated medial glide to R patella grade 3 STM to R Vastus latralis. Decreased burning sensation to 4/10 Seated medial glide to L patella grade 3  STM L vastus lateralis. Decreased burning sensation to 5/10 L LE   Supine medial rotation mobilization to R knee in supine, grade 3 - Supine medial rotation mobilization to L knee in supine grade 3 -     Decreased bilateral knee pain and leg burning sensation after manual therapy to promote patellar mobility and decrease vastus lateralis muscle tension.            PT Long Term Goals - 07/29/16 1742      PT LONG TERM GOAL #1   Title Pt will be independent with her HEP to promote LE strength in function.    Time 8   Period Weeks   Status New     PT LONG TERM GOAL #2   Title Patient will improve bilateral LE strength by at least 1/2 MMT grade to promote ability to ambulate and perform functional tasks.    Time 8   Period Weeks   Status New     PT LONG TERM GOAL #3   Title Patient will improve her LEFS score by at least 9 points as a demonstration of improved function.    Baseline 14/80 (07/29/2016)   Time 8   Period Weeks   Status New     PT LONG TERM GOAL #4   Title Pt will report being able to walk/stand with SPC over 35 min to promote mobility.    Baseline Pt states being able to walk for about 30 min (07/29/2016)   Time 8   Period Weeks   Status New     PT LONG TERM GOAL #5   Title Patient will have a decrease in bilateral LE pain to 5/10 or less at worst to promote ability to perform functional tasks.    Baseline 8/10 at worst (07/29/2016)   Time 8   Period Weeks   Status New               Plan - 08/04/16 1436    Clinical Impression Statement Decreased bilateral knee pain and leg burning sensation after manual therapy to promote patellar mobility and decrease vastus lateralis muscle tension.    Rehab Potential Fair   Clinical Impairments Affecting Rehab Potential Chronicity of condition   PT Frequency 2x / week   PT Duration 8 weeks   PT Treatment/Interventions Electrical Stimulation;Aquatic Therapy;Ultrasound;Gait training;Functional mobility training;Therapeutic activities;Therapeutic exercise;Balance training;Neuromuscular re-education;Patient/family education;Manual techniques;Dry needling   PT Next Visit Plan LE strengthening, modalities PRN   Consulted and Agree with Plan of Care Patient      Patient will benefit from skilled therapeutic intervention in order to improve the following deficits and impairments:  Pain, Abnormal gait, Decreased balance, Decreased range of motion, Decreased strength, Difficulty walking  Visit Diagnosis: Muscle weakness  (generalized)  Difficulty in walking, not elsewhere classified  Pain in left leg  Pain in right leg  Right knee pain, unspecified chronicity  Left knee pain, unspecified chronicity     Problem List Patient Active Problem List   Diagnosis Date Noted  . Weakness 11/03/2012   [name] PT, DPT   08/04/2016, 3:29 PM  [facility]  [address], [place], [address] Phone: [phone]   Fax:  [phone]  Name: [name] MRN: [id number] Date of Birth: July 14, 1945

## 2016-08-04 NOTE — Patient Instructions (Signed)
Adduction: Hip - Knees Together (Sitting)   Sit with at least 2 folded pillows between knees. Push knees together as you squeeze your rear end muscles. Hold for _5__ seconds. Repeat _10__ times. Do __3_ times a day.  Copyright  VHI. All rights reserved.

## 2016-08-06 ENCOUNTER — Ambulatory Visit: Payer: Medicare Other

## 2016-08-06 DIAGNOSIS — M25561 Pain in right knee: Secondary | ICD-10-CM

## 2016-08-06 DIAGNOSIS — R262 Difficulty in walking, not elsewhere classified: Secondary | ICD-10-CM

## 2016-08-06 DIAGNOSIS — M79604 Pain in right leg: Secondary | ICD-10-CM

## 2016-08-06 DIAGNOSIS — M79605 Pain in left leg: Secondary | ICD-10-CM | POA: Diagnosis not present

## 2016-08-06 DIAGNOSIS — M6281 Muscle weakness (generalized): Secondary | ICD-10-CM

## 2016-08-06 DIAGNOSIS — M25562 Pain in left knee: Secondary | ICD-10-CM

## 2016-08-06 NOTE — Therapy (Signed)
[facility] [address], [place], [address] Phone: [phone]   Fax:  [phone]  Physical Therapy Treatment  Patient Details  Name: [name] MRN: [id number] Date of Birth: Oct 06, 1945 Referring Provider: Shary Decamp, [place]  Encounter Date: 08/06/2016      PT End of Session - 08/06/16 1611    Visit Number 3   Number of Visits 17   Date for PT Re-Evaluation 09/24/16   Authorization Type 3   Authorization Time Period of 10 g code   PT Start Time 1540   PT Stop Time 8850   PT Time Calculation (min) 54 min   Activity Tolerance Patient tolerated treatment well      Past Medical History:  Diagnosis Date  . Arthritis   . Depression     Past Surgical History:  Procedure Laterality Date  . ABDOMINAL HYSTERECTOMY  1995  . back sugery    . BUNIONECTOMY  2013   rt foot  . COLONOSCOPY    . MUSCLE BIOPSY Left 10/03/2012   Procedure: LEFT QUADRICEP MUSCLE BIOPSY;  Surgeon: Odis Hollingshead, MD;  Location: [facility];  Service: General;  Laterality: Left;  . NECK SURGERY  2010   cerv disc fused     There were no vitals filed for this visit.      Subjective Assessment - 08/06/16 1540    Subjective Pt states that the manual therapy helped.  L knee and leg feels burning a little bit (7/10). R knee and leg not burning today.   Pertinent History LE weakness.  Symptoms occured suddenly, unknown method of injury prior to her first neck fusion surgery on January 2010. Had lower back surgery fusion in 2011.  The neck and back surgeries did not help. Pt states having increased urinary urgency and takes medication for for it. MD aware.  Denies saddle anesthesia.  Pt states that her doctor told her that PT is the only thing that is going to help her keep moving so she continues to participate in PT.  Last round of PT was last year which helped.  Currently has difficulty walking, performing chores  (wash dishes, laundry), cooking. Better able to do her tasks a little bit when she does therapy but gets harder when she stops.  Feels burning and stinging in both her knees, and bilateral anterior and lateral legs.  Pt states not having back or neck pain. Just a stiff neck.  Pt states she usually walks with a cane on her R side. No falls within the last 6 months.     Patient Stated Goals Be better able to walk, and get around better without the stinging and burning in her knees and legs.    Currently in Pain? Yes   Pain Score 7    Pain Onset More than a month ago                                 PT Education - 08/06/16 1806    Education provided Yes   Education Details ther-ex, E-stim for strengthening   Person(s) Educated Patient   Methods Explanation;Demonstration;Tactile cues;Handout;Verbal cues   Comprehension Returned demonstration;Verbalized understanding        Objectives   Manual therapy   Seated medial glide to L patella grade 3  STM L vastus lateralis.  Supine medial rotation mobilization to L knee  in supine grade 3 -     There-ex  Seated manually resisted L knee flexion targeting the medial hamstrings 10x2. Slight decrease in burning sensation  Seated hip adduction ball squeeze with glute max squeeze 10x5 seconds. Decreased burning sensation  Sit <> stand from elevated mat tbale (24 inches) 2x. R posterior knee discomfort  Then with ball between knees for more neutral thighs. 5x. No R posterior knee discomfort  Seated manually resisted clam shell isometrics hips less than 90 degrees flexion 10x5 seconds    Improved exercise technique, movement at target joints, use of target muscles after min to mod verbal, visual, tactile cues.      E-stim x 15 min at end of session  [place] E-stim setting at 30 mA to R VMO with R knee extension with strap assist to end range during 10 seconds on/ rest at 10 seconds off to promote R quadriceps  strength to help with sit <> stand transfers.    No L LE burning sensation after manual therapy and exercise to promote hip adductor and glute max muscle activation. Used E-stim to promote R LE strengthening to promote ability to perform functional tasks.        PT Long Term Goals - 07/29/16 1742      PT LONG TERM GOAL #1   Title Pt will be independent with her HEP to promote LE strength in function.   Time 8   Period Weeks   Status New     PT LONG TERM GOAL #2   Title Patient will improve bilateral LE strength by at least 1/2 MMT grade to promote ability to ambulate and perform functional tasks.    Time 8   Period Weeks   Status New     PT LONG TERM GOAL #3   Title Patient will improve her LEFS score by at least 9 points as a demonstration of improved function.    Baseline 14/80 (07/29/2016)   Time 8   Period Weeks   Status New     PT LONG TERM GOAL #4   Title Pt will report being able to walk/stand with SPC over 35 min to promote mobility.    Baseline Pt states being able to walk for about 30 min (07/29/2016)   Time 8   Period Weeks   Status New     PT LONG TERM GOAL #5   Title Patient will have a decrease in bilateral LE pain to 5/10 or less at worst to promote ability to perform functional tasks.    Baseline 8/10 at worst (07/29/2016)   Time 8   Period Weeks   Status New               Plan - 08/06/16 1807    Clinical Impression Statement No L LE burning sensation after manual therapy and exercise to promote hip adductor and glute max muscle activation. Used E-stim to promote R LE strengthening to promote ability to perform functional tasks.    Rehab Potential Fair   Clinical Impairments Affecting Rehab Potential Chronicity of condition   PT Frequency 2x / week   PT Duration 8 weeks   PT Treatment/Interventions Electrical Stimulation;Aquatic Therapy;Ultrasound;Gait training;Functional mobility training;Therapeutic activities;Therapeutic exercise;Balance  training;Neuromuscular re-education;Patient/family education;Manual techniques;Dry needling   PT Next Visit Plan LE strengthening, modalities PRN   Consulted and Agree with Plan of Care Patient      Patient will benefit from skilled therapeutic intervention in order to improve the following deficits and impairments:  Pain, Abnormal gait, Decreased balance, Decreased range of motion, Decreased strength, Difficulty walking  Visit Diagnosis: Left knee pain, unspecified chronicity  Muscle weakness (generalized)  Difficulty in walking, not elsewhere classified  Pain in left leg  Pain in right leg  Right knee pain, unspecified chronicity     Problem List Patient Active Problem List   Diagnosis Date Noted  . Weakness 11/03/2012    [name] PT, DPT   08/06/2016, 6:17 PM  [facility] [address], [place], [address] Phone: [phone]   Fax:  [phone]  Name: SHERRIL SHIPMAN MRN: [id number] Date of Birth: [birth year]

## 2016-08-11 ENCOUNTER — Ambulatory Visit: Payer: Medicare Other

## 2016-08-11 DIAGNOSIS — M25562 Pain in left knee: Secondary | ICD-10-CM

## 2016-08-11 DIAGNOSIS — M25561 Pain in right knee: Secondary | ICD-10-CM

## 2016-08-11 DIAGNOSIS — R262 Difficulty in walking, not elsewhere classified: Secondary | ICD-10-CM | POA: Diagnosis not present

## 2016-08-11 DIAGNOSIS — M79605 Pain in left leg: Secondary | ICD-10-CM | POA: Diagnosis not present

## 2016-08-11 DIAGNOSIS — M6281 Muscle weakness (generalized): Secondary | ICD-10-CM | POA: Diagnosis not present

## 2016-08-11 DIAGNOSIS — M79604 Pain in right leg: Secondary | ICD-10-CM

## 2016-08-11 NOTE — Therapy (Signed)
[facility] [address] [place], [address] Phone: [phone]   Fax:  [phone]  Physical Therapy Treatment  Patient Details  Name: [name] MRN: [id number] Date of Birth: 07/23/1945 Referring Provider: Shary Decamp, [place]  Encounter Date: 08/11/2016      PT End of Session - 08/11/16 1522    Visit Number 4   Number of Visits 17   Date for PT Re-Evaluation 09/24/16   Authorization Type 4   Authorization Time Period of 10 g code   PT Start Time 1522   PT Stop Time 2423   PT Time Calculation (min) 59 min   Activity Tolerance Patient tolerated treatment well      Past Medical History:  Diagnosis Date  . Arthritis   . Depression     Past Surgical History:  Procedure Laterality Date  . ABDOMINAL HYSTERECTOMY  1995  . back sugery    . BUNIONECTOMY  2013   rt foot  . COLONOSCOPY    . MUSCLE BIOPSY Left 10/03/2012   Procedure: LEFT QUADRICEP MUSCLE BIOPSY;  Surgeon: Odis Hollingshead, MD;  Location: [facility];  Service: General;  Laterality: Left;  . NECK SURGERY  2010   cerv disc fused     There were no vitals filed for this visit.      Subjective Assessment - 08/11/16 1525    Subjective The burning is almost gone in the L knee and leg. Its gone on the R LE. 3/10 L knee burning.    Pertinent History LE weakness.  Symptoms occured suddenly, unknown method of injury prior to her first neck fusion surgery on January 2010. Had lower back surgery fusion in 2011.  The neck and back surgeries did not help. Pt states having increased urinary urgency and takes medication for for it. MD aware.  Denies saddle anesthesia.  Pt states that her doctor told her that PT is the only thing that is going to help her keep moving so she continues to participate in PT.  Last round of PT was last year which helped.  Currently has difficulty walking, performing chores (wash dishes, laundry), cooking.  Better able to do her tasks a little bit when she does therapy but gets harder when she stops.  Feels burning and stinging in both her knees, and bilateral anterior and lateral legs.  Pt states not having back or neck pain. Just a stiff neck.  Pt states she usually walks with a cane on her R side. No falls within the last 6 months.     Patient Stated Goals Be better able to walk, and get around better without the stinging and burning in her knees and legs.    Currently in Pain? Yes   Pain Score 3   L knee burning.    Pain Onset More than a month ago                                 PT Education - 08/11/16 1547    Education provided Yes   Education Details ther-ex   [employer]) Educated Patient   Methods Explanation;Demonstration;Tactile cues;Verbal cues   Comprehension Returned demonstration;Verbalized understanding        Objectives  There-ex  Seated manually resisted clam shell isometrics hips less than 90 degrees flexion 10x5 seconds for 2 sets  Seated L knee flexion targeting the medial  hamstrings 10x resisting red band .   Then 10x2 resisting green band   Sit <> stand from elevated mat tbale (24 inches) 2x with yellow band resisting hip abduction/ER. R knee pain  Then with  ball between knees for more neutral thighs. 5x2. No R knee discomfort  Seated hip adduction ball squeeze with glute max squeeze 10x5 seconds for 2 sets.   Side stepping with bilateral UE assist 5 ft to the L and 5 ft to the R 5x to work glute med muscles.   Seated PT assisted R hip flexion 10x3 with L hip extension isometrics   Sit <> stand throughout session from chair with cues for femoral control.    Improved exercise technique, movement at target joints, use of target muscles after min to mod verbal, visual, tactile cues.       E-stim x 15 min at end of session  [place] E-stim setting at 27 mA to R VMO with R knee extension with strap assist to end range during 10  seconds on/ rest at 10 seconds off to promote R quadriceps strength to help with sit <> stand transfers.    Good carry over of decreased bilateral knee and leg burning. Focused more on bilateral LE strengthening today to help improve function at home. Difficulty with bilateral femoral control with sit to stand resulting in bilateral knee discomfort. Difficulty with R hip flexion secondary to weakness.             PT Long Term Goals - 07/29/16 1742      PT LONG TERM GOAL #1   Title Pt will be independent with her HEP to promote LE strength in function.   Time 8   Period Weeks   Status New     PT LONG TERM GOAL #2   Title Patient will improve bilateral LE strength by at least 1/2 MMT grade to promote ability to ambulate and perform functional tasks.    Time 8   Period Weeks   Status New     PT LONG TERM GOAL #3   Title Patient will improve her LEFS score by at least 9 points as a demonstration of improved function.    Baseline 14/80 (07/29/2016)   Time 8   Period Weeks   Status New     PT LONG TERM GOAL #4   Title Pt will report being able to walk/stand with SPC over 35 min to promote mobility.    Baseline Pt states being able to walk for about 30 min (07/29/2016)   Time 8   Period Weeks   Status New     PT LONG TERM GOAL #5   Title Patient will have a decrease in bilateral LE pain to 5/10 or less at worst to promote ability to perform functional tasks.    Baseline 8/10 at worst (07/29/2016)   Time 8   Period Weeks   Status New               Plan - 08/11/16 1547    Clinical Impression Statement Good carry over of decreased bilateral knee and leg burning. Focused more on bilateral LE strengthening today to help improve function at home. Difficulty with bilateral femoral control with sit to stand resulting in bilateral knee discomfort. Difficulty with R hip flexion secondary to weakness.    Rehab Potential Fair   Clinical Impairments Affecting Rehab Potential  Chronicity of condition   PT Frequency 2x / week   PT Duration 8  weeks   PT Treatment/Interventions Electrical Stimulation;Aquatic Therapy;Ultrasound;Gait training;Functional mobility training;Therapeutic activities;Therapeutic exercise;Balance training;Neuromuscular re-education;Patient/family education;Manual techniques;Dry needling   PT Next Visit Plan LE strengthening, modalities PRN   Consulted and Agree with Plan of Care Patient      Patient will benefit from skilled therapeutic intervention in order to improve the following deficits and impairments:  Pain, Abnormal gait, Decreased balance, Decreased range of motion, Decreased strength, Difficulty walking  Visit Diagnosis: Muscle weakness (generalized)  Difficulty in walking, not elsewhere classified  Pain in left leg  Pain in right leg  Right knee pain, unspecified chronicity  Left knee pain, unspecified chronicity     Problem List Patient Active Problem List   Diagnosis Date Noted  . Weakness 11/03/2012   [name] PT, DPT   08/11/2016, 8:07 PM  [facility] [address] [place], [address] Phone: [phone]   Fax:  [phone]  Name: [name] MRN: [id number] Date of Birth: 1946-01-13

## 2016-08-13 ENCOUNTER — Ambulatory Visit: Payer: Medicare Other

## 2016-08-13 DIAGNOSIS — R262 Difficulty in walking, not elsewhere classified: Secondary | ICD-10-CM | POA: Diagnosis not present

## 2016-08-13 DIAGNOSIS — M25562 Pain in left knee: Secondary | ICD-10-CM | POA: Diagnosis not present

## 2016-08-13 DIAGNOSIS — M79605 Pain in left leg: Secondary | ICD-10-CM | POA: Diagnosis not present

## 2016-08-13 DIAGNOSIS — M6281 Muscle weakness (generalized): Secondary | ICD-10-CM | POA: Diagnosis not present

## 2016-08-13 DIAGNOSIS — M25561 Pain in right knee: Secondary | ICD-10-CM | POA: Diagnosis not present

## 2016-08-13 DIAGNOSIS — M79604 Pain in right leg: Secondary | ICD-10-CM | POA: Diagnosis not present

## 2016-08-13 NOTE — Therapy (Signed)
[facility] [address] [place], [address] Phone: [phone]   Fax:  [phone]  Physical Therapy Treatment  Patient Details  Name: [name] MRN: [id number] Date of Birth: 14-Dec-1945 Referring Provider: Shary Decamp, [place]  Encounter Date: 08/13/2016      PT End of Session - 08/13/16 1307    Visit Number 5   Number of Visits 17   Date for PT Re-Evaluation 09/24/16   Authorization Type 5   Authorization Time Period of 10 g code   PT Start Time 1307   PT Stop Time 1406   PT Time Calculation (min) 59 min   Activity Tolerance Patient tolerated treatment well      Past Medical History:  Diagnosis Date  . Arthritis   . Depression     Past Surgical History:  Procedure Laterality Date  . ABDOMINAL HYSTERECTOMY  1995  . back sugery    . BUNIONECTOMY  2013   rt foot  . COLONOSCOPY    . MUSCLE BIOPSY Left 10/03/2012   Procedure: LEFT QUADRICEP MUSCLE BIOPSY;  Surgeon: Odis Hollingshead, MD;  Location: [facility];  Service: General;  Laterality: Left;  . NECK SURGERY  2010   cerv disc fused     There were no vitals filed for this visit.      Subjective Assessment - 08/13/16 1308    Subjective No burning R LE, just a little burning sensation L knee and L lateral leg 4/10.    Pertinent History LE weakness.  Symptoms occured suddenly, unknown method of injury prior to her first neck fusion surgery on January 2010. Had lower back surgery fusion in 2011.  The neck and back surgeries did not help. Pt states having increased urinary urgency and takes medication for for it. MD aware.  Denies saddle anesthesia.  Pt states that her doctor told her that PT is the only thing that is going to help her keep moving so she continues to participate in PT.  Last round of PT was last year which helped.  Currently has difficulty walking, performing chores (wash dishes, laundry), cooking. Better able  to do her tasks a little bit when she does therapy but gets harder when she stops.  Feels burning and stinging in both her knees, and bilateral anterior and lateral legs.  Pt states not having back or neck pain. Just a stiff neck.  Pt states she usually walks with a cane on her R side. No falls within the last 6 months.     Patient Stated Goals Be better able to walk, and get around better without the stinging and burning in her knees and legs.    Currently in Pain? Yes   Pain Score 4   L knee and lateral leg burning    Pain Onset More than a month ago                                 PT Education - 08/13/16 1328    Education provided Yes   Education Details ther-ex   [employer]) Educated Patient   Methods Explanation;Demonstration;Tactile cues;Verbal cues   Comprehension Returned demonstration;Verbalized understanding        Objectives   Manual therapy   Seated medial glide to L patella grade 3  STM L vastus lateralis.  Supine medial rotation mobilization to L knee in supine grade  3 -   Decreased L LE burning sensation afterwards    There-ex  Seated assisted R hip flexion with PT assist and L hip extension isometrics 10x2  Seated low rows resisting yellow band 10x  Then red band 10x  Seated manually resisted bilateral shoulder extension with pt holding PVC rod 5x5 seconds for 4 sets  Sitting with upright posture: rhythmic stabilizatoin with pt holding PVC rod   Rotation 30 seconds x 2  Side bend 30 seconds x 2  Seated manually resisted clam shell isometrics hips less than 90 degrees flexion 10x5 seconds    Improved exercise technique, movement at target joints, use of target muscles after min to mod verbal, visual, tactile cues.      E-stim x 15 min at end of session  [place] E-stim setting at 28 mA to R VMO with R knee extension during 10 seconds on/ rest at 10 seconds off to promote R quadriceps strength to help with sit  <> stand transfers.    Decreased L LE burning sensation after manual therapy. Worked on trunk strengthening in addition to R hip flexion and bilateral glute med strengthening to help promote LE strength and ability to perform functional tasks.        PT Long Term Goals - 07/29/16 1742      PT LONG TERM GOAL #1   Title Pt will be independent with her HEP to promote LE strength in function.   Time 8   Period Weeks   Status New     PT LONG TERM GOAL #2   Title Patient will improve bilateral LE strength by at least 1/2 MMT grade to promote ability to ambulate and perform functional tasks.    Time 8   Period Weeks   Status New     PT LONG TERM GOAL #3   Title Patient will improve her LEFS score by at least 9 points as a demonstration of improved function.    Baseline 14/80 (07/29/2016)   Time 8   Period Weeks   Status New     PT LONG TERM GOAL #4   Title Pt will report being able to walk/stand with SPC over 35 min to promote mobility.    Baseline Pt states being able to walk for about 30 min (07/29/2016)   Time 8   Period Weeks   Status New     PT LONG TERM GOAL #5   Title Patient will have a decrease in bilateral LE pain to 5/10 or less at worst to promote ability to perform functional tasks.    Baseline 8/10 at worst (07/29/2016)   Time 8   Period Weeks   Status New               Plan - 08/13/16 1307    Clinical Impression Statement Decreased L LE burning sensation after manual therapy. Worked on trunk strengthening in addition to R hip flexion and bilateral glute med strengthening to help promote LE strength and ability to perform functional tasks.    Rehab Potential Fair   Clinical Impairments Affecting Rehab Potential Chronicity of condition   PT Frequency 2x / week   PT Duration 8 weeks   PT Treatment/Interventions Electrical Stimulation;Aquatic Therapy;Ultrasound;Gait training;Functional mobility training;Therapeutic activities;Therapeutic exercise;Balance  training;Neuromuscular re-education;Patient/family education;Manual techniques;Dry needling   PT Next Visit Plan LE strengthening, modalities PRN   Consulted and Agree with Plan of Care Patient      Patient will benefit from skilled therapeutic intervention in order  to improve the following deficits and impairments:  Pain, Abnormal gait, Decreased balance, Decreased range of motion, Decreased strength, Difficulty walking  Visit Diagnosis: Muscle weakness (generalized)  Difficulty in walking, not elsewhere classified  Pain in left leg  Left knee pain, unspecified chronicity     Problem List Patient Active Problem List   Diagnosis Date Noted  . Weakness 11/03/2012    [name] PT, DPT   08/13/2016, 6:28 PM  [facility] [address], [place], [address] Phone: [phone]   Fax:  [phone]  Name: [name] MRN: [id number] Date of Birth: 1945-09-01

## 2016-08-19 ENCOUNTER — Ambulatory Visit: Payer: Medicare Other

## 2016-08-25 ENCOUNTER — Ambulatory Visit: Payer: Medicare Other | Attending: Physician Assistant

## 2016-08-25 DIAGNOSIS — M79605 Pain in left leg: Secondary | ICD-10-CM | POA: Diagnosis not present

## 2016-08-25 DIAGNOSIS — M6281 Muscle weakness (generalized): Secondary | ICD-10-CM

## 2016-08-25 DIAGNOSIS — R262 Difficulty in walking, not elsewhere classified: Secondary | ICD-10-CM | POA: Diagnosis not present

## 2016-08-25 DIAGNOSIS — M25561 Pain in right knee: Secondary | ICD-10-CM | POA: Insufficient documentation

## 2016-08-25 DIAGNOSIS — M25562 Pain in left knee: Secondary | ICD-10-CM | POA: Insufficient documentation

## 2016-08-25 DIAGNOSIS — M79604 Pain in right leg: Secondary | ICD-10-CM | POA: Insufficient documentation

## 2016-08-25 NOTE — Therapy (Signed)
[facility] [address], [place], [address] Phone: [phone]   Fax:  [phone]  Physical Therapy Treatment  Patient Details  Name: [name] MRN: [id number] Date of Birth: Jan 22, 1946 Referring Provider: Shary Decamp, [place]  Encounter Date: 08/25/2016      PT End of Session - 08/25/16 1356    Visit Number 6   Number of Visits 17   Date for PT Re-Evaluation 09/24/16   Authorization Type 6   Authorization Time Period of 10 g code   PT Start Time 3474  pt arrived late   PT Stop Time 1453   PT Time Calculation (min) 57 min   Activity Tolerance Patient tolerated treatment well      Past Medical History:  Diagnosis Date  . Arthritis   . Depression     Past Surgical History:  Procedure Laterality Date  . ABDOMINAL HYSTERECTOMY  1995  . back sugery    . BUNIONECTOMY  2013   rt foot  . COLONOSCOPY    . MUSCLE BIOPSY Left 10/03/2012   Procedure: LEFT QUADRICEP MUSCLE BIOPSY;  Surgeon: Odis Hollingshead, MD;  Location: [facility];  Service: General;  Laterality: Left;  . NECK SURGERY  2010   cerv disc fused     There were no vitals filed for this visit.      Subjective Assessment - 08/25/16 1357    Subjective No burning in R LE. Very little buring in L lateral leg.  7-8/10. Some days function is good, some days not as much. The strength in her R side is weak.    Pertinent History LE weakness.  Symptoms occured suddenly, unknown method of injury prior to her first neck fusion surgery on January 2010. Had lower back surgery fusion in 2011.  The neck and back surgeries did not help. Pt states having increased urinary urgency and takes medication for for it. MD aware.  Denies saddle anesthesia.  Pt states that her doctor told her that PT is the only thing that is going to help her keep moving so she continues to participate in PT.  Last round of PT was last year which helped.   Currently has difficulty walking, performing chores (wash dishes, laundry), cooking. Better able to do her tasks a little bit when she does therapy but gets harder when she stops.  Feels burning and stinging in both her knees, and bilateral anterior and lateral legs.  Pt states not having back or neck pain. Just a stiff neck.  Pt states she usually walks with a cane on her R side. No falls within the last 6 months.     Patient Stated Goals Be better able to walk, and get around better without the stinging and burning in her knees and legs.    Currently in Pain? Yes   Pain Score 2   burning   Pain Orientation Left   Pain Onset More than a month ago                                 PT Education - 08/25/16 1400    Education provided Yes   Education Details ther-ex   [employer]) Educated Patient   Methods Explanation;Demonstration;Tactile cues;Verbal cues   Comprehension Returned demonstration;Verbalized understanding        Objectives   Manual therapy   Seated STM L tibialis anterior (  area of burning sensation)   No burning sensation afterwards. Just a slight tingle     There-ex  Standing with bilateral UE assist:  R hip flexion 10x2  R hip abduction 10x2  R hip extension 10x2    R knee flexion 10x2  Sit <> stand from elevated mat table 26 inches height with bilateral UE assist 5x3 with emphasis on even weight shifting and femoral control. No knee pain.   Seated low rows resisting red band 10x3 with 5 second holds to promote trunk muscle use. Decreased L upper trap muscle pain.     Improved exercise technique, movement at target joints, use of target muscles after min to mod verbal, visual, tactile cues.    E-stim x 15 min at end of session  [place] E-stim setting at 26 mA to R VMO with R knee extension during 10 seconds on/ rest at 10 seconds off to promote R quadriceps strength to help with sit <>stand transfers.      No L leg  burning sensation following manual therapy to decrease L tibialis anterior muscle tension. Continued working on R LE strengthening to promote function at home. Difficulty with R hip flexion for swing phase secondary to weakness.           PT Long Term Goals - 07/29/16 1742      PT LONG TERM GOAL #1   Title Pt will be independent with her HEP to promote LE strength in function.   Time 8   Period Weeks   Status New     PT LONG TERM GOAL #2   Title Patient will improve bilateral LE strength by at least 1/2 MMT grade to promote ability to ambulate and perform functional tasks.    Time 8   Period Weeks   Status New     PT LONG TERM GOAL #3   Title Patient will improve her LEFS score by at least 9 points as a demonstration of improved function.    Baseline 14/80 (07/29/2016)   Time 8   Period Weeks   Status New     PT LONG TERM GOAL #4   Title Pt will report being able to walk/stand with SPC over 35 min to promote mobility.    Baseline Pt states being able to walk for about 30 min (07/29/2016)   Time 8   Period Weeks   Status New     PT LONG TERM GOAL #5   Title Patient will have a decrease in bilateral LE pain to 5/10 or less at worst to promote ability to perform functional tasks.    Baseline 8/10 at worst (07/29/2016)   Time 8   Period Weeks   Status New               Plan - 08/25/16 1422    Clinical Impression Statement No L leg burning sensation following manual therapy to decrease L tibialis anterior muscle tension. Continued working on R LE strengthening to promote function at home. Difficulty with R hip flexion for swing phase secondary to weakness.    Clinical Presentation Stable   Clinical Decision Making Low   Rehab Potential Fair   Clinical Impairments Affecting Rehab Potential Chronicity of condition   PT Frequency 2x / week   PT Duration 8 weeks   PT Treatment/Interventions Electrical Stimulation;Aquatic Therapy;Ultrasound;Gait training;Functional  mobility training;Therapeutic activities;Therapeutic exercise;Balance training;Neuromuscular re-education;Patient/family education;Manual techniques;Dry needling   PT Next Visit Plan LE strengthening, modalities PRN   Consulted and Agree  with Plan of Care Patient      Patient will benefit from skilled therapeutic intervention in order to improve the following deficits and impairments:  Pain, Abnormal gait, Decreased balance, Decreased range of motion, Decreased strength, Difficulty walking  Visit Diagnosis: Muscle weakness (generalized)  Difficulty in walking, not elsewhere classified  Pain in left leg     Problem List Patient Active Problem List   Diagnosis Date Noted  . Weakness 11/03/2012    [name] PT, DPT   08/25/2016, 3:07 PM  [facility] [address] [place], [address] Phone: [phone]   Fax:  [phone]  Name: [name] MRN: [id number] Date of Birth: 1945/05/26

## 2016-08-27 ENCOUNTER — Ambulatory Visit: Payer: Medicare Other

## 2016-08-31 ENCOUNTER — Ambulatory Visit: Payer: Medicare Other

## 2016-09-01 ENCOUNTER — Ambulatory Visit: Payer: Medicare Other

## 2016-09-01 DIAGNOSIS — M25561 Pain in right knee: Secondary | ICD-10-CM | POA: Diagnosis not present

## 2016-09-01 DIAGNOSIS — M79604 Pain in right leg: Secondary | ICD-10-CM

## 2016-09-01 DIAGNOSIS — M79605 Pain in left leg: Secondary | ICD-10-CM

## 2016-09-01 DIAGNOSIS — R262 Difficulty in walking, not elsewhere classified: Secondary | ICD-10-CM

## 2016-09-01 DIAGNOSIS — M25562 Pain in left knee: Secondary | ICD-10-CM

## 2016-09-01 DIAGNOSIS — M6281 Muscle weakness (generalized): Secondary | ICD-10-CM | POA: Diagnosis not present

## 2016-09-01 NOTE — Patient Instructions (Addendum)
  Lying down on your back   March with your right knee   Repeat 10 times,    Perform 3 sets daily    Sitting down on your bed and a blue band around your knees:   Stretch the band out to the side.     Keep your trunk from moving.    Repeat 10 times   Perform 3 sets daily or every other day.

## 2016-09-01 NOTE — Therapy (Signed)
[facility] [address] [place], [address] Phone: [phone]   Fax:  [phone]  Physical Therapy Treatment  Patient Details  Name: [name] MRN: [id number] Date of Birth: 1946/02/05 Referring Provider: Shary Decamp, [place]  Encounter Date: 09/01/2016      PT End of Session - 09/01/16 1306    Visit Number 7   Number of Visits 17   Date for PT Re-Evaluation 09/24/16   Authorization Type 7   Authorization Time Period of 10 g code   PT Start Time 6811  pt arrived late   PT Stop Time 1348   PT Time Calculation (min) 41 min   Activity Tolerance Patient tolerated treatment well      Past Medical History:  Diagnosis Date  . Arthritis   . Depression     Past Surgical History:  Procedure Laterality Date  . ABDOMINAL HYSTERECTOMY  1995  . back sugery    . BUNIONECTOMY  2013   rt foot  . COLONOSCOPY    . MUSCLE BIOPSY Left 10/03/2012   Procedure: LEFT QUADRICEP MUSCLE BIOPSY;  Surgeon: Odis Hollingshead, MD;  Location: [facility];  Service: General;  Laterality: Left;  . NECK SURGERY  2010   cerv disc fused     There were no vitals filed for this visit.      Subjective Assessment - 09/01/16 1309    Subjective Pt states falling onto the floor Sunday when turning around doing laundry. Does not feel any different since the fall. Just feels swelling in her knees. Pt landed on her knees. It was not a hard fall.  Pt states that she is tired of all of this (condition).  Difficulty standing and cooking at times due to legs being tired.    No burning in R knee and leg.  Just a little burning in her L knee 4-5/10 currently.  Also feels discomfort upper back and lower neck on L side (muscle tension) since the fall.  Pt states that it has improved.    Pertinent History LE weakness.  Symptoms occured suddenly, unknown method of injury prior to her first neck fusion surgery on January 2010.  Had lower back surgery fusion in 2011.  The neck and back surgeries did not help. Pt states having increased urinary urgency and takes medication for for it. MD aware.  Denies saddle anesthesia.  Pt states that her doctor told her that PT is the only thing that is going to help her keep moving so she continues to participate in PT.  Last round of PT was last year which helped.  Currently has difficulty walking, performing chores (wash dishes, laundry), cooking. Better able to do her tasks a little bit when she does therapy but gets harder when she stops.  Feels burning and stinging in both her knees, and bilateral anterior and lateral legs.  Pt states not having back or neck pain. Just a stiff neck.  Pt states she usually walks with a cane on her R side. No falls within the last 6 months.     Patient Stated Goals Be better able to walk, and get around better without the stinging and burning in her knees and legs.    Currently in Pain? Yes   Pain Score 5   4-5/10 L knee burning sensation   Pain Onset More than a month ago  PT Education - 09/01/16 1348    Education provided Yes   Education Details ther-ex, HEP   Person(s) Educated Patient   Methods Explanation;Demonstration;Tactile cues;Verbal cues   Comprehension Verbalized understanding;Returned demonstration        Objectives  No TTP bilateral knees. Pt able to ambulate with SPC. Pt was recommended to contact her MD if she feels like her symptoms from the fall worsens. Pt verbalized understanding.    There-ex  Supine R hip flexion march with PT assist 10x2  Supine R hip flexion PT assisted 5x2   Supine R hip flexion no assist 5x  Supine SLR R hip flexion 4x no PT assist  Then with L leg straight (decreased posterior pelvic tilt). Increased difficulty performing  Seated R hip flexion 5x2 difficulty  Then with L foot propped onto a step 5x R hip flexion in  sitting  Reclined R hip flexion 1/2 SLR 5x  Reclined R knee flexion heel slides 5x2. R hip flexor muscle use palpated   Sit <> stand from elevated mat table 26 inches height with bilateral UE assist 5x, then at height 24 for 5x2  Seated clamshells hips less than 90 degrees flexion 10x3   Improved exercise technique, movement at target joints, use of target muscles after min to mod verbal, visual, tactile cues.     Able to perform R hip flexion to [facility] in supine with L LE in hooklying position for posterior pelvic tilt. Difficulty with R hip flexion in sitting (more gravitational resistance). Pt states R leg does not feel as heavy when trying to pick up her leg as she walks after session.          PT Long Term Goals - 07/29/16 1742      PT LONG TERM GOAL #1   Title Pt will be independent with her HEP to promote LE strength in function.   Time 8   Period Weeks   Status New     PT LONG TERM GOAL #2   Title Patient will improve bilateral LE strength by at least 1/2 MMT grade to promote ability to ambulate and perform functional tasks.    Time 8   Period Weeks   Status New     PT LONG TERM GOAL #3   Title Patient will improve her LEFS score by at least 9 points as a demonstration of improved function.    Baseline 14/80 (07/29/2016)   Time 8   Period Weeks   Status New     PT LONG TERM GOAL #4   Title Pt will report being able to walk/stand with SPC over 35 min to promote mobility.    Baseline Pt states being able to walk for about 30 min (07/29/2016)   Time 8   Period Weeks   Status New     PT LONG TERM GOAL #5   Title Patient will have a decrease in bilateral LE pain to 5/10 or less at worst to promote ability to perform functional tasks.    Baseline 8/10 at worst (07/29/2016)   Time 8   Period Weeks   Status New               Plan - 09/01/16 1306    Clinical Impression Statement Able to perform R hip flexion to [facility] in supine with L LE in hooklying position for  posterior pelvic tilt. Difficulty with R hip flexion in sitting (more gravitational resistance). Pt states R leg does not feel as heavy  when trying to pick up her leg as she walks after session.    Rehab Potential Fair   Clinical Impairments Affecting Rehab Potential Chronicity of condition   PT Frequency 2x / week   PT Duration 8 weeks   PT Treatment/Interventions Electrical Stimulation;Aquatic Therapy;Ultrasound;Gait training;Functional mobility training;Therapeutic activities;Therapeutic exercise;Balance training;Neuromuscular re-education;Patient/family education;Manual techniques;Dry needling   PT Next Visit Plan LE strengthening, modalities PRN   Consulted and Agree with Plan of Care Patient      Patient will benefit from skilled therapeutic intervention in order to improve the following deficits and impairments:  Pain, Abnormal gait, Decreased balance, Decreased range of motion, Decreased strength, Difficulty walking  Visit Diagnosis: Muscle weakness (generalized)  Difficulty in walking, not elsewhere classified  Pain in left leg  Left knee pain, unspecified chronicity  Pain in right leg  Right knee pain, unspecified chronicity     Problem List Patient Active Problem List   Diagnosis Date Noted  . Weakness 11/03/2012    [name] PT, DPT   09/01/2016, 7:56 PM   [facility] [address] [place], [address] Phone: [phone]   Fax:  [phone]  Name: MINDEL FRISCIA MRN: [id number] Date of Birth: [birth year]

## 2016-09-02 ENCOUNTER — Ambulatory Visit: Payer: Medicare Other

## 2016-09-02 ENCOUNTER — Encounter

## 2016-09-02 DIAGNOSIS — M25562 Pain in left knee: Secondary | ICD-10-CM | POA: Diagnosis not present

## 2016-09-02 DIAGNOSIS — R262 Difficulty in walking, not elsewhere classified: Secondary | ICD-10-CM

## 2016-09-02 DIAGNOSIS — M6281 Muscle weakness (generalized): Secondary | ICD-10-CM | POA: Diagnosis not present

## 2016-09-02 DIAGNOSIS — M79605 Pain in left leg: Secondary | ICD-10-CM | POA: Diagnosis not present

## 2016-09-02 DIAGNOSIS — M79604 Pain in right leg: Secondary | ICD-10-CM | POA: Diagnosis not present

## 2016-09-02 DIAGNOSIS — M25561 Pain in right knee: Secondary | ICD-10-CM | POA: Diagnosis not present

## 2016-09-02 NOTE — Therapy (Signed)
[facility] [address] [place], [address] Phone: [phone]   Fax:  [phone]  Physical Therapy Treatment  Patient Details  Name: [name] MRN: [id number] Date of Birth: 1945/10/01 Referring Provider: Shary Decamp, [place]  Encounter Date: 09/02/2016      PT End of Session - 09/02/16 1125    Visit Number 8   Number of Visits 17   Date for PT Re-Evaluation 09/24/16   Authorization Type 8   Authorization Time Period of 10 g code   PT Start Time 2595  pt arrived late   PT Stop Time 1207   PT Time Calculation (min) 42 min   Activity Tolerance Patient tolerated treatment well      Past Medical History:  Diagnosis Date  . Arthritis   . Depression     Past Surgical History:  Procedure Laterality Date  . ABDOMINAL HYSTERECTOMY  1995  . back sugery    . BUNIONECTOMY  2013   rt foot  . COLONOSCOPY    . MUSCLE BIOPSY Left 10/03/2012   Procedure: LEFT QUADRICEP MUSCLE BIOPSY;  Surgeon: Odis Hollingshead, MD;  Location: [facility];  Service: General;  Laterality: Left;  . NECK SURGERY  2010   cerv disc fused     There were no vitals filed for this visit.      Subjective Assessment - 09/02/16 1127    Subjective No burning in her LE's today.  No soreness in R hip.    Pertinent History LE weakness.  Symptoms occured suddenly, unknown method of injury prior to her first neck fusion surgery on January 2010. Had lower back surgery fusion in 2011.  The neck and back surgeries did not help. Pt states having increased urinary urgency and takes medication for for it. MD aware.  Denies saddle anesthesia.  Pt states that her doctor told her that PT is the only thing that is going to help her keep moving so she continues to participate in PT.  Last round of PT was last year which helped.  Currently has difficulty walking, performing chores (wash dishes, laundry), cooking. Better able to do her  tasks a little bit when she does therapy but gets harder when she stops.  Feels burning and stinging in both her knees, and bilateral anterior and lateral legs.  Pt states not having back or neck pain. Just a stiff neck.  Pt states she usually walks with a cane on her R side. No falls within the last 6 months.     Patient Stated Goals Be better able to walk, and get around better without the stinging and burning in her knees and legs.    Currently in Pain? No/denies   Pain Score 0-No pain   Pain Onset More than a month ago                                 PT Education - 09/02/16 1139    Education provided Yes   Education Details E-stim for hip flexion strengthening to promote hip flexion during gait.   Person(s) Educated Patient   Methods Explanation;Demonstration;Tactile cues;Verbal cues   Comprehension Returned demonstration;Verbalized understanding        Objectives  E-stim   [place] E-stim to R hip flexors 70mA  - 40 mA with R hip flexor muscle use during 10 seconds on/rest at 10 seconds  off to promote R hip flexion strength for swing phase of gait. 15 minutes.  Seated assisted R hip flexion   Then standing R hip flexion onto 3 inch step  There-ex  Part of there-ex performed during the 15 minutes of E-stim  Seated assisted R hip flexion   Then standing R hip flexion onto 3 inch step with PT CGA to min A support in standing  After E-stim  Standing lateral walk 5 ft to the R and 5 ft to the L 5x with bilateral UE assist with emphasis on R hip flexion  Standing bilateral glute max squeeze 10x5 seconds  Standing mini squats with bilateral UE assist 10x2 with emphasis on femoral control  Slight R posterior lateral knee tightness afterwards. No pain.   Reviewed HEP. Pt demonstrated and verbalized understanding. Handout provided   Improved exercise technique, movement at target joints, use of target muscles after mod verbal, visual, tactile cues.      Improved ability to perform standing hip flexion with assistance of E-stim to R hip flexor muscle. Continued working on LE strengthening to promote function and ability to ambulate. Improved bilateral LE pain with pt stating no bilateral LE burning sensation today.          PT Long Term Goals - 07/29/16 1742      PT LONG TERM GOAL #1   Title Pt will be independent with her HEP to promote LE strength in function.   Time 8   Period Weeks   Status New     PT LONG TERM GOAL #2   Title Patient will improve bilateral LE strength by at least 1/2 MMT grade to promote ability to ambulate and perform functional tasks.    Time 8   Period Weeks   Status New     PT LONG TERM GOAL #3   Title Patient will improve her LEFS score by at least 9 points as a demonstration of improved function.    Baseline 14/80 (07/29/2016)   Time 8   Period Weeks   Status New     PT LONG TERM GOAL #4   Title Pt will report being able to walk/stand with SPC over 35 min to promote mobility.    Baseline Pt states being able to walk for about 30 min (07/29/2016)   Time 8   Period Weeks   Status New     PT LONG TERM GOAL #5   Title Patient will have a decrease in bilateral LE pain to 5/10 or less at worst to promote ability to perform functional tasks.    Baseline 8/10 at worst (07/29/2016)   Time 8   Period Weeks   Status New               Plan - 09/02/16 1140    Clinical Impression Statement Improved ability to perform standing hip flexion with assistance of E-stim to R hip flexor muscle. Continued working on LE strengthening to promote function and ability to ambulate. Improved bilateral LE pain with pt stating no bilateral LE burning sensation today.    History and Personal Factors relevant to plan of care: chronicity of condition, weakness.    Clinical Presentation Stable   Clinical Presentation due to: decreased bilateral LE burning sensation.    Clinical Decision Making Low   Rehab Potential  Fair   Clinical Impairments Affecting Rehab Potential Chronicity of condition   PT Frequency 2x / week   PT Duration 8 weeks   PT Treatment/Interventions  Electrical Stimulation;Aquatic Therapy;Ultrasound;Gait training;Functional mobility training;Therapeutic activities;Therapeutic exercise;Balance training;Neuromuscular re-education;Patient/family education;Manual techniques;Dry needling   PT Next Visit Plan LE strengthening, modalities PRN   Consulted and Agree with Plan of Care Patient      Patient will benefit from skilled therapeutic intervention in order to improve the following deficits and impairments:  Pain, Abnormal gait, Decreased balance, Decreased range of motion, Decreased strength, Difficulty walking  Visit Diagnosis: Muscle weakness (generalized)  Difficulty in walking, not elsewhere classified     Problem List Patient Active Problem List   Diagnosis Date Noted  . Weakness 11/03/2012   [name] PT, DPT   09/02/2016, 12:21 PM  [facility] [address], [place], [address] Phone: [phone]   Fax:  [phone]  Name: LOUINE TENPENNY MRN: [id number] Date of Birth: September 09, 1945

## 2016-09-02 NOTE — Patient Instructions (Signed)
  Standing and holding onto something sturdy for support:    Squeeze your rear end muscles together.    Hold for 5 seconds.    Repeat 10 times   Perform 3 sets daily      Standing and holding onto something sturdy for support and a chair behind you (just in case you want to sit)    Perform mini squats 10 times    Keep your thighs in neutral   Perform 2 sets daily.      You can try using toe extension splint to help with your 4th toe at your right foot so long as it does not bother you.

## 2016-09-07 ENCOUNTER — Ambulatory Visit: Payer: Medicare Other

## 2016-09-07 DIAGNOSIS — M6281 Muscle weakness (generalized): Secondary | ICD-10-CM | POA: Diagnosis not present

## 2016-09-07 DIAGNOSIS — R262 Difficulty in walking, not elsewhere classified: Secondary | ICD-10-CM

## 2016-09-07 DIAGNOSIS — M79605 Pain in left leg: Secondary | ICD-10-CM

## 2016-09-07 DIAGNOSIS — M25562 Pain in left knee: Secondary | ICD-10-CM | POA: Diagnosis not present

## 2016-09-07 DIAGNOSIS — M79604 Pain in right leg: Secondary | ICD-10-CM

## 2016-09-07 DIAGNOSIS — M25561 Pain in right knee: Secondary | ICD-10-CM | POA: Diagnosis not present

## 2016-09-07 NOTE — Therapy (Signed)
[facility] [address], [place], [address] Phone: [phone]   Fax:  [phone]  Physical Therapy Treatment And Progress Report  Patient Details  Name: [name] MRN: [id number] Date of Birth: 08-09-1945 Referring Provider: Shary Decamp, [place]  Encounter Date: 09/07/2016      PT End of Session - 09/07/16 1052    Visit Number 9   Number of Visits 17   Date for PT Re-Evaluation 09/24/16   Authorization Type 1   Authorization Time Period of 10 g code   PT Start Time 6803  pt arrived late   PT Stop Time 1149   PT Time Calculation (min) 57 min   Activity Tolerance Patient tolerated treatment well      Past Medical History:  Diagnosis Date  . Arthritis   . Depression     Past Surgical History:  Procedure Laterality Date  . ABDOMINAL HYSTERECTOMY  1995  . back sugery    . BUNIONECTOMY  2013   rt foot  . COLONOSCOPY    . MUSCLE BIOPSY Left 10/03/2012   Procedure: LEFT QUADRICEP MUSCLE BIOPSY;  Surgeon: Odis Hollingshead, MD;  Location: [facility];  Service: General;  Laterality: Left;  . NECK SURGERY  2010   cerv disc fused     There were no vitals filed for this visit.      Subjective Assessment - 09/07/16 1054    Subjective Not really having burning in her legs today. Feels swelling in her knees when she's up on them alot (a couple of hours).  0/10 currently, Burning has been minimal... just a touch.  Better than before.   No knee pain today per pt. Pt states being able to tolerate standing/walking for about an hour. Goes to the [employer] on Fridays and walks around.   Pertinent History LE weakness.  Symptoms occured suddenly, unknown method of injury prior to her first neck fusion surgery on January 2010. Had lower back surgery fusion in 2011.  The neck and back surgeries did not help. Pt states having increased urinary urgency and takes medication for for it. MD  aware.  Denies saddle anesthesia.  Pt states that her doctor told her that PT is the only thing that is going to help her keep moving so she continues to participate in PT.  Last round of PT was last year which helped.  Currently has difficulty walking, performing chores (wash dishes, laundry), cooking. Better able to do her tasks a little bit when she does therapy but gets harder when she stops.  Feels burning and stinging in both her knees, and bilateral anterior and lateral legs.  Pt states not having back or neck pain. Just a stiff neck.  Pt states she usually walks with a cane on her R side. No falls within the last 6 months.     Patient Stated Goals Be better able to walk, and get around better without the stinging and burning in her knees and legs.    Currently in Pain? No/denies   Pain Score 0-No pain   Pain Onset More than a month ago            [facility] PT Assessment - 09/07/16 1057      Strength   Overall Strength Comments seated manually resisted hip extension:  4-/5 R, 4+/5 L    Right Hip Flexion 2+/5   Right Hip ABduction 4+/5  seated clamshell  position, hips less than 90 degrees flexion   Left Hip Flexion 4+/5   Left Hip ABduction 4+/5  seated clamshell position, hips less than 90 degrees flexion   Right Knee Flexion 4/5   Right Knee Extension 4+/5   Left Knee Flexion 4+/5   Left Knee Extension 5/5   Right Ankle Dorsiflexion 4/5   Left Ankle Dorsiflexion 4/5                             PT Education - 09/07/16 1116    Education provided Yes   Education Details ther-ex   Person(s) Educated Patient   Methods Explanation;Demonstration;Tactile cues;Verbal cues   Comprehension Verbalized understanding;Returned demonstration        Objectives  No knee pain today per pt. Pt states being able to tolerate standing/walking for about an hour. Goes to the [employer] on Fridays and walks around.   E-stim   [place] E-stim to R hip flexors  63m with R hip flexor muscle use during 10 seconds on/rest at 10 seconds off to promote R hip flexion strength for swing phase of gait. 15 minutes. Seated assisted R hip flexion  Then standing R hip flexion onto 3 inch step  There-ex  Seated manually resisted hip flexion, hip extension, clamshell, knee flexion, knee extension             Reviewed progress/current status with LE strength with pt.  Pt education with femoral control and knee pain    Part of there-ex performed during the 15 minutes of E-stim. Cues for technique and use of target muscles Seated assisted R hip flexion  Then standing R hip flexion onto 3 inch step with PT CGA to min A support in standing  After E-stim  Seated R hip extension isometrics 10x5 seconds for 3 sets to promote glute max strength  Sit <> stand from regular chair with arms with yellow band resisting hip abduction/ER 5x             Slight R posterior knee tightness (popliteal area)     Improved exercise technique, movement at target joints, use of target muscles after min to mod verbal, visual, tactile cues.   Pt demonstrates overall improved LE strength, decreased bilateral knee and leg pain and burning sensation, improved standing/walking tolerance since initial evaluation. Improving R hip flexior strength. Better able to flex R hip in less than 90 degree hip flexion angles and in supine position. Pt still demonstrates bilateral LE weakness, bilateral knee pain, difficulty with R hip flexion during swing phase of gait and difficulty walking and performing other functional tasks. Patient would benefit from continued skilled physical therapy services to address the aforementioned deficits.                       PT Long Term Goals - 09/07/16 1231      PT LONG TERM GOAL #1   Title Pt will be independent with her HEP to promote LE strength in function.   Time 8   Period  Weeks   Status On-going     PT LONG TERM GOAL #2   Title Patient will improve bilateral LE strength by at least 1/2 MMT grade to promote ability to ambulate and perform functional tasks.    Time 8   Period Weeks   Status Partially Met     PT LONG TERM GOAL #3   Title Patient will  improve her LEFS score by at least 9 points as a demonstration of improved function.    Baseline 14/80 (07/29/2016)   Time 8   Period Weeks   Status On-going     PT LONG TERM GOAL #4   Title Pt will report being able to walk/stand with SPC over 35 min to promote mobility.    Baseline Pt states being able to walk for about 30 min (07/29/2016); about 1 hour per pt reports (2016/10/01)   Time 8   Period Weeks   Status Achieved     PT LONG TERM GOAL #5   Title Patient will have a decrease in bilateral LE pain to 5/10 or less at worst to promote ability to perform functional tasks.    Baseline 8/10 at worst (07/29/2016); Minimal burning sensation bilateral knee and legs, no pain level number provided. Pt also states feeling pain in her knees during the weekend. No pain level provided (01-Oct-2016)   Time 8   Period Weeks   Status Partially Met               Plan - 10-01-2016 1132    Clinical Impression Statement Pt demonstrates overall improved LE strength, decreased bilateral knee and leg pain and burning sensation, improved standing/walking tolerance since initial evaluation. Improving R hip flexior strength. Better able to flex R hip in less than 90 degree hip flexion angles and in supine position. Pt still demonstrates bilateral LE weakness, bilateral knee pain, difficulty with R hip flexion during swing phase of gait and difficulty walking and performing other functional tasks. Patient would benefit from continued skilled physical therapy services to address the aforementioned deficits.    History and Personal Factors relevant to plan of care: chronicity of condition, weakness   Clinical Presentation Stable    Clinical Presentation due to: improved LE strength, decreased LE burning sensation   Clinical Decision Making Low   Rehab Potential Fair   Clinical Impairments Affecting Rehab Potential Chronicity of condition   PT Frequency 2x / week   PT Duration 8 weeks   PT Treatment/Interventions Electrical Stimulation;Aquatic Therapy;Ultrasound;Gait training;Functional mobility training;Therapeutic activities;Therapeutic exercise;Balance training;Neuromuscular re-education;Patient/family education;Manual techniques;Dry needling   PT Next Visit Plan LE strengthening, modalities PRN   Consulted and Agree with Plan of Care Patient      Patient will benefit from skilled therapeutic intervention in order to improve the following deficits and impairments:  Pain, Abnormal gait, Decreased balance, Decreased range of motion, Decreased strength, Difficulty walking  Visit Diagnosis: Muscle weakness (generalized)  Difficulty in walking, not elsewhere classified  Pain in left leg  Left knee pain, unspecified chronicity  Pain in right leg  Right knee pain, unspecified chronicity       G-Codes - 10/01/2016 1235    Functional Assessment Tool Used (Outpatient Only) clinical presentation, patient interview   Functional Limitation Mobility: Walking and moving around   Mobility: Walking and Moving Around Current Status (D2202) At least 60 percent but less than 80 percent impaired, limited or restricted   Mobility: Walking and Moving Around Goal Status [phone]) At least 40 percent but less than 60 percent impaired, limited or restricted  pt demonstrates potential for improvement      Problem List Patient Active Problem List   Diagnosis Date Noted  . Weakness 11/03/2012    Thank you for your referral.  [name] PT, DPT   2016-10-01, 12:41 PM  [facility] [address],  [place], [address] Phone: [phone]   Fax:   [phone]  Name: [name] MRN: [id number] Date of Birth: 07-31-1945

## 2016-09-10 ENCOUNTER — Ambulatory Visit: Payer: Medicare Other

## 2016-09-10 DIAGNOSIS — M6281 Muscle weakness (generalized): Secondary | ICD-10-CM

## 2016-09-10 DIAGNOSIS — M25562 Pain in left knee: Secondary | ICD-10-CM | POA: Diagnosis not present

## 2016-09-10 DIAGNOSIS — M79605 Pain in left leg: Secondary | ICD-10-CM | POA: Diagnosis not present

## 2016-09-10 DIAGNOSIS — R262 Difficulty in walking, not elsewhere classified: Secondary | ICD-10-CM | POA: Diagnosis not present

## 2016-09-10 DIAGNOSIS — M79604 Pain in right leg: Secondary | ICD-10-CM | POA: Diagnosis not present

## 2016-09-10 DIAGNOSIS — M25561 Pain in right knee: Secondary | ICD-10-CM | POA: Diagnosis not present

## 2016-09-10 NOTE — Therapy (Signed)
[facility] [address] [place], [address] Phone: [phone]   Fax:  [phone]  Physical Therapy Treatment  Patient Details  Name: [name] MRN: [id number] Date of Birth: 1945-04-02 Referring Provider: Amanda Pea, [place]  Encounter Date: 09/10/2016      PT End of Session - 09/10/16 1306    Visit Number 10   Number of Visits 17   Date for PT Re-Evaluation 09/24/16   Authorization Type 2   Authorization Time Period of 10 g code   PT Start Time 1306   PT Stop Time 1350   PT Time Calculation (min) 44 min   Activity Tolerance Patient tolerated treatment well      Past Medical History:  Diagnosis Date  . Arthritis   . Depression     Past Surgical History:  Procedure Laterality Date  . ABDOMINAL HYSTERECTOMY  1995  . back sugery    . BUNIONECTOMY  2013   rt foot  . COLONOSCOPY    . MUSCLE BIOPSY Left 10/03/2012   Procedure: LEFT QUADRICEP MUSCLE BIOPSY;  Surgeon: Adolph Pollack, MD;  Location: [facility];  Service: General;  Laterality: Left;  . NECK SURGERY  2010   cerv disc fused     There were no vitals filed for this visit.      Subjective Assessment - 09/10/16 1308    Subjective No burning bilateral LE. Feels bilateral knee pain R > L. 5/10 knee pain currently. 6/10 R knee > L pain when walking from parking lot to clinic.  Has a family reunion in [place] the last weekend of July.    Pertinent History LE weakness.  Symptoms occured suddenly, unknown method of injury prior to her first neck fusion surgery on January 2010. Had lower back surgery fusion in 2011.  The neck and back surgeries did not help. Pt states having increased urinary urgency and takes medication for for it. MD aware.  Denies saddle anesthesia.  Pt states that her doctor told her that PT is the only thing that is going to help her keep moving so she continues to participate in PT.  Last round of PT  was last year which helped.  Currently has difficulty walking, performing chores (wash dishes, laundry), cooking. Better able to do her tasks a little bit when she does therapy but gets harder when she stops.  Feels burning and stinging in both her knees, and bilateral anterior and lateral legs.  Pt states not having back or neck pain. Just a stiff neck.  Pt states she usually walks with a cane on her R side. No falls within the last 6 months.     Patient Stated Goals Be better able to walk, and get around better without the stinging and burning in her knees and legs.    Currently in Pain? Yes   Pain Score 5   bilateral knee pain   Pain Onset More than a month ago                                 PT Education - 09/10/16 1317    Education provided Yes   Education Details ther-ex   [employer]) Educated Patient   Methods Explanation;Demonstration;Tactile cues;Verbal cues   Comprehension Returned demonstration;Verbalized understanding        Objectives   E-stim   [place] E-stim to R hip  flexors 71m with R hip flexor muscle use during 10 seconds on/rest at 10 seconds off to promote R hip flexion strength for swing phase of gait. 15 minutes. Seated assisted R hip flexion  Then standing R hip flexion onto 3 inch step  There-ex  Part of there-ex performed during the 15 minutes of E-stim. Cues for technique and use of target muscles Seated assisted R hip flexion  Then standing R hip flexion onto 3 inch step with PT CGA to min A support in standing  After E-stim  Standing side step 5 ft to the L and 5 ft to the R 6x with bilateral UE assist   Standing low rows resisting red band 10x  Then green band 10x2  Seated R hip extension isometrics 10x5 seconds for 3 sets to promote glute max strength   Sit <> stand from regular chair with arms with yellow band resisting hip abduction/ER 5x, then  3x no knee pain during first 5 repetitions  R medial knee pain after 3rd repetition of 2nd set       Improved exercise technique, movement at target joints, use of target muscles after min to mod verbal, visual, tactile cues.     Pt continues to have no bilateral burning sensation in her LE. Continued working on increasing hip flexor muscle strength to improve ability to ambulate and glute strength to promote femoral control to help decrease knee pain.           PT Long Term Goals - 09/07/16 1231      PT LONG TERM GOAL #1   Title Pt will be independent with her HEP to promote LE strength in function.   Time 8   Period Weeks   Status On-going     PT LONG TERM GOAL #2   Title Patient will improve bilateral LE strength by at least 1/2 MMT grade to promote ability to ambulate and perform functional tasks.    Time 8   Period Weeks   Status Partially Met     PT LONG TERM GOAL #3   Title Patient will improve her LEFS score by at least 9 points as a demonstration of improved function.    Baseline 14/80 (07/29/2016)   Time 8   Period Weeks   Status On-going     PT LONG TERM GOAL #4   Title Pt will report being able to walk/stand with SPC over 35 min to promote mobility.    Baseline Pt states being able to walk for about 30 min (07/29/2016); about 1 hour per pt reports (09/07/2016)   Time 8   Period Weeks   Status Achieved     PT LONG TERM GOAL #5   Title Patient will have a decrease in bilateral LE pain to 5/10 or less at worst to promote ability to perform functional tasks.    Baseline 8/10 at worst (07/29/2016); Minimal burning sensation bilateral knee and legs, no pain level number provided. Pt also states feeling pain in her knees during the weekend. No pain level provided (09/07/2016)   Time 8   Period Weeks   Status Partially Met               Plan - 09/10/16 1317    Clinical Impression Statement Pt continues to have no bilateral burning sensation  in her LE. Continued working on increasing hip flexor muscle strength to improve ability to ambulate and glute strength to promote femoral control to help decrease knee pain.  History and Personal Factors relevant to plan of care: chronicity of condition, weakness   Clinical Presentation Stable   Clinical Presentation due to: decreased bilateral LE burning sensation.    Clinical Decision Making Low   Rehab Potential Fair   Clinical Impairments Affecting Rehab Potential Chronicity of condition   PT Frequency 2x / week   PT Duration 8 weeks   PT Treatment/Interventions Electrical Stimulation;Aquatic Therapy;Ultrasound;Gait training;Functional mobility training;Therapeutic activities;Therapeutic exercise;Balance training;Neuromuscular re-education;Patient/family education;Manual techniques;Dry needling   PT Next Visit Plan LE strengthening, modalities PRN   Consulted and Agree with Plan of Care Patient      Patient will benefit from skilled therapeutic intervention in order to improve the following deficits and impairments:  Pain, Abnormal gait, Decreased balance, Decreased range of motion, Decreased strength, Difficulty walking  Visit Diagnosis: Muscle weakness (generalized)  Difficulty in walking, not elsewhere classified     Problem List Patient Active Problem List   Diagnosis Date Noted  . Weakness 11/03/2012   [name] PT, DPT   09/10/2016, 7:06 PM  [facility] [address] [place], [address] Phone: [phone]   Fax:  [phone]  Name: [name] MRN: [id number] Date of Birth: 1945/07/25

## 2016-09-14 ENCOUNTER — Ambulatory Visit: Payer: Medicare Other

## 2016-09-14 DIAGNOSIS — M25561 Pain in right knee: Secondary | ICD-10-CM

## 2016-09-14 DIAGNOSIS — M79605 Pain in left leg: Secondary | ICD-10-CM | POA: Diagnosis not present

## 2016-09-14 DIAGNOSIS — M79604 Pain in right leg: Secondary | ICD-10-CM | POA: Diagnosis not present

## 2016-09-14 DIAGNOSIS — R262 Difficulty in walking, not elsewhere classified: Secondary | ICD-10-CM

## 2016-09-14 DIAGNOSIS — M25562 Pain in left knee: Secondary | ICD-10-CM

## 2016-09-14 DIAGNOSIS — M6281 Muscle weakness (generalized): Secondary | ICD-10-CM | POA: Diagnosis not present

## 2016-09-14 NOTE — Therapy (Signed)
[facility] [address] [place], [address] Phone: [phone]   Fax:  [phone]  Physical Therapy Treatment  Patient Details  Name: [name] MRN: [id number] Date of Birth: [birth year] Referring Provider: Shary Decamp, [place]  Encounter Date: 09/14/2016      PT End of Session - 09/14/16 1124    Visit Number 11   Number of Visits 17   Date for PT Re-Evaluation 09/24/16   Authorization Type 3   Authorization Time Period of 10 g code   PT Start Time 1120  pt arrived late   PT Stop Time 1210   PT Time Calculation (min) 50 min   Activity Tolerance Patient tolerated treatment well      Past Medical History:  Diagnosis Date  . Arthritis   . Depression     Past Surgical History:  Procedure Laterality Date  . ABDOMINAL HYSTERECTOMY  1995  . back sugery    . BUNIONECTOMY  2013   rt foot  . COLONOSCOPY    . MUSCLE BIOPSY Left 10/03/2012   Procedure: LEFT QUADRICEP MUSCLE BIOPSY;  Surgeon: Odis Hollingshead, MD;  Location: [facility];  Service: General;  Laterality: Left;  . NECK SURGERY  2010   cerv disc fused     There were no vitals filed for this visit.      Subjective Assessment - 09/14/16 1127    Subjective 8/10 R knee and 7/10 L knee pain when walking. Not swollen today. No more burning sensation. Got an appointment with Dr Aurea Graff PA for her knees September 27, 2016.    Pertinent History LE weakness.  Symptoms occured suddenly, unknown method of injury prior to her first neck fusion surgery on January 2010. Had lower back surgery fusion in 2011.  The neck and back surgeries did not help. Pt states having increased urinary urgency and takes medication for for it. MD aware.  Denies saddle anesthesia.  Pt states that her doctor told her that PT is the only thing that is going to help her keep moving so she continues to participate in PT.  Last round of PT was last year which helped.   Currently has difficulty walking, performing chores (wash dishes, laundry), cooking. Better able to do her tasks a little bit when she does therapy but gets harder when she stops.  Feels burning and stinging in both her knees, and bilateral anterior and lateral legs.  Pt states not having back or neck pain. Just a stiff neck.  Pt states she usually walks with a cane on her R side. No falls within the last 6 months.     Patient Stated Goals Be better able to walk, and get around better without the stinging and burning in her knees and legs.    Currently in Pain? Yes   Pain Score 8    Pain Onset More than a month ago                                 PT Education - 09/14/16 1129    Education provided Yes   Education Details ther-ex   [employer]) Educated Patient   Methods Explanation;Demonstration;Tactile cues;Verbal cues   Comprehension Returned demonstration;Verbalized understanding        Objectives   E-stim   [place] E-stim to R hip flexors 26m with R hip flexor muscle use during 10  seconds on/rest at 10 seconds off to promote R hip flexion strength for swing phase of gait. 15 minutes. Seated assisted R hip flexion  Then standing R hip flexion onto 3 inch step  Then Lateral toe taps with hip flexion with bilateral UE assist  There-ex  Part of there-ex performed during the 15 minutes of E-stim. Cues for technique and use of target muscles Seated assisted R hip flexion  Then standing R hip flexion onto 3 inch step with PT CGA to min A support in standing  Then lateral toe taps with hip flexion with bilateral UE assist   After E-stim  Supine SLR R hip flexion 10x R LE Side stepping 5 ft to the L and 5 ft to the R 10x with bilateral UE assist Standing low rows resisting green band 10x2 to promote trunk muscle use   Improved exercise technique, movement at target joints, use of target muscles  after min to mod verbal, visual, tactile cues.     Manual therapy:   Supine medial rotational mobilization grade 3- R and L knees. No bilateral knee pain with gait afterwards      No bilateral knee pain with gait after manual therapy. Continued working in increasing R hip flexion strength to promote ability to ambulate with improved R foot clearance. Possible overuse of R lateral hamstrings with increased standing activity secondary to pt mentioning feeling tightness around that area. Continue working on bilateral glute strengthening to promote femoral control, proper knee positioning and decrease bilateral genu valgus with gait and standing activities.          PT Long Term Goals - 09/07/16 1231      PT LONG TERM GOAL #1   Title Pt will be independent with her HEP to promote LE strength in function.   Time 8   Period Weeks   Status On-going     PT LONG TERM GOAL #2   Title Patient will improve bilateral LE strength by at least 1/2 MMT grade to promote ability to ambulate and perform functional tasks.    Time 8   Period Weeks   Status Partially Met     PT LONG TERM GOAL #3   Title Patient will improve her LEFS score by at least 9 points as a demonstration of improved function.    Baseline 14/80 (07/29/2016)   Time 8   Period Weeks   Status On-going     PT LONG TERM GOAL #4   Title Pt will report being able to walk/stand with SPC over 35 min to promote mobility.    Baseline Pt states being able to walk for about 30 min (07/29/2016); about 1 hour per pt reports (09/07/2016)   Time 8   Period Weeks   Status Achieved     PT LONG TERM GOAL #5   Title Patient will have a decrease in bilateral LE pain to 5/10 or less at worst to promote ability to perform functional tasks.    Baseline 8/10 at worst (07/29/2016); Minimal burning sensation bilateral knee and legs, no pain level number provided. Pt also states feeling pain in her knees during the weekend. No pain level provided  (09/07/2016)   Time 8   Period Weeks   Status Partially Met               Plan - 09/14/16 1130    Clinical Impression Statement No bilateral knee pain with gait after manual therapy. Continued working in increasing R hip flexion  strength to promote ability to ambulate with improved R foot clearance. Possible overuse of R lateral hamstrings with increased standing activity secondary to pt mentioning feeling tightness around that area. Continue working on bilateral glute strengthening to promote femoral control, proper knee positioning and decrease bilateral genu valgus with gait and standing activities.    History and Personal Factors relevant to plan of care: chronicity of condition, weakness   Clinical Presentation Stable   Clinical Presentation due to: decreased bilateral LE burning sensation, decreased bilateral knee pain   Clinical Decision Making Low   Rehab Potential Fair   Clinical Impairments Affecting Rehab Potential Chronicity of condition   PT Frequency 2x / week   PT Duration 8 weeks   PT Treatment/Interventions Electrical Stimulation;Aquatic Therapy;Ultrasound;Gait training;Functional mobility training;Therapeutic activities;Therapeutic exercise;Balance training;Neuromuscular re-education;Patient/family education;Manual techniques;Dry needling   PT Next Visit Plan LE strengthening, modalities PRN   Consulted and Agree with Plan of Care Patient      Patient will benefit from skilled therapeutic intervention in order to improve the following deficits and impairments:  Pain, Abnormal gait, Decreased balance, Decreased range of motion, Decreased strength, Difficulty walking  Visit Diagnosis: Muscle weakness (generalized)  Difficulty in walking, not elsewhere classified  Left knee pain, unspecified chronicity  Right knee pain, unspecified chronicity     Problem List Patient Active Problem List   Diagnosis Date Noted  . Weakness 11/03/2012    [name] PT,  DPT   09/14/2016, 12:29 PM  [facility] [address], [place], [address] Phone: [phone]   Fax:  [phone]  Name: KENTLEY CEDILLO MRN: [id number] Date of Birth: 16-Jun-1945

## 2016-09-16 ENCOUNTER — Ambulatory Visit: Payer: Medicare Other

## 2016-09-21 ENCOUNTER — Encounter

## 2016-09-24 ENCOUNTER — Encounter

## 2016-09-28 ENCOUNTER — Ambulatory Visit: Payer: Medicare Other | Attending: Physician Assistant

## 2016-09-28 DIAGNOSIS — M25562 Pain in left knee: Secondary | ICD-10-CM | POA: Insufficient documentation

## 2016-09-28 DIAGNOSIS — R262 Difficulty in walking, not elsewhere classified: Secondary | ICD-10-CM | POA: Insufficient documentation

## 2016-09-28 DIAGNOSIS — M25561 Pain in right knee: Secondary | ICD-10-CM | POA: Insufficient documentation

## 2016-09-28 DIAGNOSIS — M6281 Muscle weakness (generalized): Secondary | ICD-10-CM | POA: Insufficient documentation

## 2016-09-28 DIAGNOSIS — M79605 Pain in left leg: Secondary | ICD-10-CM | POA: Insufficient documentation

## 2016-09-28 DIAGNOSIS — M79604 Pain in right leg: Secondary | ICD-10-CM | POA: Diagnosis not present

## 2016-09-28 NOTE — Therapy (Signed)
[facility] [address] [place], [address] Phone: [phone]   Fax:  [phone]  Physical Therapy Treatment  Patient Details  Name: [name] MRN: [id number] Date of Birth: 1946-02-14 Referring Provider: Shary Decamp, [place]  Encounter Date: 09/28/2016      PT End of Session - 09/28/16 1350    Visit Number 12   Number of Visits 25   Date for PT Re-Evaluation 10/29/16   Authorization Type 4   Authorization Time Period of 10 g code   PT Start Time 1314   PT Stop Time 1355   PT Time Calculation (min) 41 min   Activity Tolerance Patient tolerated treatment well   Behavior During Therapy [facility] for tasks assessed/performed      Past Medical History:  Diagnosis Date  . Arthritis   . Depression     Past Surgical History:  Procedure Laterality Date  . ABDOMINAL HYSTERECTOMY  1995  . back sugery    . BUNIONECTOMY  2013   rt foot  . COLONOSCOPY    . MUSCLE BIOPSY Left 10/03/2012   Procedure: LEFT QUADRICEP MUSCLE BIOPSY;  Surgeon: Odis Hollingshead, MD;  Location: [facility];  Service: General;  Laterality: Left;  . NECK SURGERY  2010   cerv disc fused     There were no vitals filed for this visit.      Subjective Assessment - 09/28/16 1316    Subjective Pt states that both ankles are swollen. Went to [employer] last week. Also walked on the sand. The swelling happened before going to the beach. Might see a doctor about it.  Did not do any exercises while at the beach other than walking.  No burning sensation. Just tingling at the outside of her L leg.   No knee or leg pain bilaterally during the past 7 days.  3/10 L leg burning feeling at most for the past 7 days.    Pertinent History LE weakness.  Symptoms occured suddenly, unknown method of injury prior to her first neck fusion surgery on January 2010. Had lower back surgery fusion in 2011.  The neck and back surgeries did not  help. Pt states having increased urinary urgency and takes medication for for it. MD aware.  Denies saddle anesthesia.  Pt states that her doctor told her that PT is the only thing that is going to help her keep moving so she continues to participate in PT.  Last round of PT was last year which helped.  Currently has difficulty walking, performing chores (wash dishes, laundry), cooking. Better able to do her tasks a little bit when she does therapy but gets harder when she stops.  Feels burning and stinging in both her knees, and bilateral anterior and lateral legs.  Pt states not having back or neck pain. Just a stiff neck.  Pt states she usually walks with a cane on her R side. No falls within the last 6 months.     Patient Stated Goals Be better able to walk, and get around better without the stinging and burning in her knees and legs.    Currently in Pain? No/denies   Pain Score 0-No pain   Pain Onset More than a month ago            [facility] PT Assessment - 09/28/16 1322      Strength   Overall Strength Comments seated manually resisted hip extension:  4+/5 R, 5/5 L    Right Hip Flexion 2+/5   Right Hip ABduction 4/5  seated clamshell position, hips less than 90 degrees flexion   Left Hip Flexion 4+/5   Left Hip ABduction 4+/5  seated clamshell position, hips less than 90 degrees flexion   Right Knee Flexion 4+/5   Right Knee Extension 5/5   Left Knee Flexion 4+/5   Left Knee Extension 5/5   Right Ankle Dorsiflexion 4/5   Left Ankle Dorsiflexion 4+/5                             PT Education - 09/28/16 1339    Education provided Yes   Education Details ther-ex, progress/current status   Person(s) Educated Patient   Methods Explanation;Demonstration;Tactile cues;Verbal cues   Comprehension Returned demonstration;Verbalized understanding       Objectives    There-ex  No abnormal warmth or redness. No pain with calf squeeze bilaterally.   Directed  patient with seated manually resisted hip extension, L hip flexion, seated clam shell isometrics, knee flexion, extension, ankle DF 1-2x each way for each LE  Reviewed progress/current status with LE strength with pt.   Pt education on decreased femoral control and possible association with ankle swelling.   Sit <> stand from elevated mat table 5x with emphasis on femoral control   Then 5 more times with tactile cues for hip abduction/ER  Seated manually resisted clamshell isometrics (hips less than 90 degrees flexion) 10x5 seconds   Improved exercise technique, movement at target joints, use of target muscles after mod verbal, visual, tactile cues.     Manual therapy  Seated STM to R lateral hamstrings and lateral quadriceps to help decrease ER of R tibia  Decreased posterior knee tightness with sit <> stand   Pt demonstrates overall improved LE strength since initial evaluation with pt better able to perform R hip flexion AROM in sitting. Pt is also better able to stand/walk for longer periods using her SPC based on her subjective reports on 09/07/2016 and has decreased bilateral LE pain level at worst. Pt still demonstrates LE weakness, especially with R hip flexion making waking challenging for her as well as decreased bilateral femoral control R > L and difficulty performing functional tasks due to weakness. Pt will benefit from continued skilled physical therapy services to address the aforementioned deficits.          PT Long Term Goals - 09/28/16 1500      PT LONG TERM GOAL #1   Title Pt will be independent with her HEP to promote LE strength in function.   Time 4   Period Weeks   Status On-going     PT LONG TERM GOAL #2   Title Patient will improve bilateral LE strength by at least 1/2 MMT grade to promote ability to ambulate and perform functional tasks.    Time 8   Period Weeks   Status Achieved     PT LONG TERM GOAL #3   Title Patient will improve her LEFS score  by at least 9 points as a demonstration of improved function.    Baseline 14/80 (07/29/2016)   Time 4   Period Weeks   Status On-going     PT LONG TERM GOAL #4   Title Pt will report being able to walk/stand with SPC over 35 min to promote mobility.    Baseline Pt states being able to walk for  about 30 min (07/29/2016); about 1 hour per pt reports (09/07/2016)   Time 8   Period Weeks   Status Achieved     PT LONG TERM GOAL #5   Title Patient will have a decrease in bilateral LE pain to 5/10 or less at worst to promote ability to perform functional tasks.    Baseline 8/10 at worst (07/29/2016); Minimal burning sensation bilateral knee and legs, no pain level number provided. Pt also states feeling pain in her knees during the weekend. No pain level provided (09/07/2016); 3/10 L leg burning sensation at most for the past 7 days (09/28/2016)   Time 8   Period Weeks   Status Achieved     Additional Long Term Goals   Additional Long Term Goals Yes     PT LONG TERM GOAL #6   Title Pt will improve seated R hip flexion strength to at least 3+/5 to promote ability to ambulate and perform standing tasks.    Baseline 2+/5 seated R hip flexion (09/28/2016)   Time 4   Period Weeks   Status New               Plan - 09/28/16 1452    Clinical Impression Statement Pt demonstrates overall improved LE strength since initial evaluation with pt better able to perform R hip flexion AROM in sitting. Pt is also better able to stand/walk for longer periods using her SPC based on her subjective reports on 09/07/2016 and has decreased bilateral LE pain level at worst. Pt still demonstrates LE weakness, especially with R hip flexion making waking challenging for her as well as decreased bilateral femoral control R > L and difficulty performing functional tasks due to weakness. Pt will benefit from continued skilled physical therapy services to address the aforementioned deficits.    History and Personal Factors  relevant to plan of care: Chronicity of condition, weakness   Clinical Presentation Stable   Clinical Presentation due to: Pt making some progress with PT towards goals.    Clinical Decision Making Low   Rehab Potential Fair   Clinical Impairments Affecting Rehab Potential Chronicity of condition   PT Frequency 2x / week   PT Duration 4 weeks   PT Treatment/Interventions Electrical Stimulation;Aquatic Therapy;Ultrasound;Gait training;Functional mobility training;Therapeutic activities;Therapeutic exercise;Balance training;Neuromuscular re-education;Patient/family education;Manual techniques;Dry needling   PT Next Visit Plan LE strengthening, modalities PRN   Consulted and Agree with Plan of Care Patient      Patient will benefit from skilled therapeutic intervention in order to improve the following deficits and impairments:  Pain, Abnormal gait, Decreased balance, Decreased range of motion, Decreased strength, Difficulty walking  Visit Diagnosis: Muscle weakness (generalized) - Plan: PT plan of care cert/re-cert  Difficulty in walking, not elsewhere classified - Plan: PT plan of care cert/re-cert  Left knee pain, unspecified chronicity - Plan: PT plan of care cert/re-cert  Right knee pain, unspecified chronicity - Plan: PT plan of care cert/re-cert  Pain in left leg - Plan: PT plan of care cert/re-cert  Pain in right leg - Plan: PT plan of care cert/re-cert     Problem List Patient Active Problem List   Diagnosis Date Noted  . Weakness 11/03/2012    [name] PT, DPT   09/28/2016, 3:13 PM  [facility] [address], [place], [address] Phone: [phone]   Fax:  [phone]  Name: [name] MRN: [id number] Date of Birth: [birth year]

## 2016-10-01 ENCOUNTER — Ambulatory Visit: Payer: Medicare Other

## 2016-10-05 ENCOUNTER — Ambulatory Visit: Payer: Medicare Other

## 2016-10-05 DIAGNOSIS — M25562 Pain in left knee: Secondary | ICD-10-CM | POA: Diagnosis not present

## 2016-10-05 DIAGNOSIS — M25561 Pain in right knee: Secondary | ICD-10-CM | POA: Diagnosis not present

## 2016-10-05 DIAGNOSIS — R262 Difficulty in walking, not elsewhere classified: Secondary | ICD-10-CM | POA: Diagnosis not present

## 2016-10-05 DIAGNOSIS — M79605 Pain in left leg: Secondary | ICD-10-CM | POA: Diagnosis not present

## 2016-10-05 DIAGNOSIS — M6281 Muscle weakness (generalized): Secondary | ICD-10-CM | POA: Diagnosis not present

## 2016-10-05 DIAGNOSIS — M79604 Pain in right leg: Secondary | ICD-10-CM | POA: Diagnosis not present

## 2016-10-05 NOTE — Therapy (Signed)
[facility] [address] [place], [address] Phone: [phone]   Fax:  [phone]  Physical Therapy Treatment  Patient Details  Name: [name] MRN: [id number] Date of Birth: 09/28/1945 Referring Provider: Shary Decamp, [place]  Encounter Date: 10/05/2016      PT End of Session - 10/05/16 1349    Visit Number 13   Number of Visits 25   Date for PT Re-Evaluation 10/29/16   Authorization Type 5   Authorization Time Period of 10 g code   PT Start Time 2202   PT Stop Time 1430   PT Time Calculation (min) 41 min   Activity Tolerance Patient tolerated treatment well   Behavior During Therapy [facility] for tasks assessed/performed      Past Medical History:  Diagnosis Date  . Arthritis   . Depression     Past Surgical History:  Procedure Laterality Date  . ABDOMINAL HYSTERECTOMY  1995  . back sugery    . BUNIONECTOMY  2013   rt foot  . COLONOSCOPY    . MUSCLE BIOPSY Left 10/03/2012   Procedure: LEFT QUADRICEP MUSCLE BIOPSY;  Surgeon: Odis Hollingshead, MD;  Location: [facility];  Service: General;  Laterality: Left;  . NECK SURGERY  2010   cerv disc fused     There were no vitals filed for this visit.      Subjective Assessment - 10/05/16 1351    Subjective R knee is giving her a fit today. Swollen. 7/10 R knee pain currently No pain in L knee. No bilateral LE burning sensation.    Pertinent History LE weakness.  Symptoms occured suddenly, unknown method of injury prior to her first neck fusion surgery on January 2010. Had lower back surgery fusion in 2011.  The neck and back surgeries did not help. Pt states having increased urinary urgency and takes medication for for it. MD aware.  Denies saddle anesthesia.  Pt states that her doctor told her that PT is the only thing that is going to help her keep moving so she continues to participate in PT.  Last round of PT was last year which  helped.  Currently has difficulty walking, performing chores (wash dishes, laundry), cooking. Better able to do her tasks a little bit when she does therapy but gets harder when she stops.  Feels burning and stinging in both her knees, and bilateral anterior and lateral legs.  Pt states not having back or neck pain. Just a stiff neck.  Pt states she usually walks with a cane on her R side. No falls within the last 6 months.     Patient Stated Goals Be better able to walk, and get around better without the stinging and burning in her knees and legs.    Currently in Pain? Yes   Pain Score 7   R knee joint.    Pain Onset More than a month ago                                 PT Education - 10/05/16 1428    Education provided Yes   Education Details ther-ex, knee positioning with sit <> stand   Person(s) Educated Patient   Methods Explanation;Demonstration;Tactile cues;Verbal cues   Comprehension Verbalized understanding;Returned demonstration       Objectives  Manual therapy    Supine R medial tibial rotation  grade 3- to 3. Slight decrease in knee pain with gait    Seated STM R lateral hamstrings   There-ex  Sit <> stand from low mat table with bilateral UE assist with emphasis on neutral R knee and thigh  5x2  Supine SLR R hip flexion 5x Supine R hip flexion marching 5x4. Difficulty to perform at first but improved with repetition Seated R ankle EV/IR on rocker board to promote ankle mobility 1.5 minutes to promote R LE femoral control while keeping her foot on the floor Seated assisted R hip flexion 5x with PT assist to end range    Improved exercise technique, movement at target joints, use of target muscles after mod verbal, visual, tactile cues.      Pt tendency to twist to the L when standing up and walking a few feet as well as tendency to lean to the R LE when going to sit down from standing, causing R knee valgus and ER of tibia. Decreased R  knee pain with treatment to promote femoral control and more neutral R tibial positioning.          PT Long Term Goals - 09/28/16 1500      PT LONG TERM GOAL #1   Title Pt will be independent with her HEP to promote LE strength in function.   Time 4   Period Weeks   Status On-going     PT LONG TERM GOAL #2   Title Patient will improve bilateral LE strength by at least 1/2 MMT grade to promote ability to ambulate and perform functional tasks.    Time 8   Period Weeks   Status Achieved     PT LONG TERM GOAL #3   Title Patient will improve her LEFS score by at least 9 points as a demonstration of improved function.    Baseline 14/80 (07/29/2016)   Time 4   Period Weeks   Status On-going     PT LONG TERM GOAL #4   Title Pt will report being able to walk/stand with SPC over 35 min to promote mobility.    Baseline Pt states being able to walk for about 30 min (07/29/2016); about 1 hour per pt reports (09/07/2016)   Time 8   Period Weeks   Status Achieved     PT LONG TERM GOAL #5   Title Patient will have a decrease in bilateral LE pain to 5/10 or less at worst to promote ability to perform functional tasks.    Baseline 8/10 at worst (07/29/2016); Minimal burning sensation bilateral knee and legs, no pain level number provided. Pt also states feeling pain in her knees during the weekend. No pain level provided (09/07/2016); 3/10 L leg burning sensation at most for the past 7 days (09/28/2016)   Time 8   Period Weeks   Status Achieved     Additional Long Term Goals   Additional Long Term Goals Yes     PT LONG TERM GOAL #6   Title Pt will improve seated R hip flexion strength to at least 3+/5 to promote ability to ambulate and perform standing tasks.    Baseline 2+/5 seated R hip flexion (09/28/2016)   Time 4   Period Weeks   Status New               Plan - 10/05/16 1430    Clinical Impression Statement Pt tendency to twist to the L when standing up and walking a few feet as  well as tendency to lean to the R LE when going to sit down from standing, causing R knee valgus and ER of tibia. Decreased R knee pain with treatment to promote femoral control and more neutral R tibial positioning.    History and Personal Factors relevant to plan of care: Chronicity of condition, weakness   Clinical Presentation Stable   Clinical Presentation due to: No bilateral LE burning sensation but R knee pain.    Clinical Decision Making Low   Rehab Potential Fair   Clinical Impairments Affecting Rehab Potential Chronicity of condition   PT Frequency 2x / week   PT Duration 4 weeks   PT Treatment/Interventions Electrical Stimulation;Aquatic Therapy;Ultrasound;Gait training;Functional mobility training;Therapeutic activities;Therapeutic exercise;Balance training;Neuromuscular re-education;Patient/family education;Manual techniques;Dry needling   PT Next Visit Plan LE strengthening, modalities PRN   Consulted and Agree with Plan of Care Patient      Patient will benefit from skilled therapeutic intervention in order to improve the following deficits and impairments:  Pain, Abnormal gait, Decreased balance, Decreased range of motion, Decreased strength, Difficulty walking  Visit Diagnosis: Muscle weakness (generalized)  Difficulty in walking, not elsewhere classified  Right knee pain, unspecified chronicity     Problem List Patient Active Problem List   Diagnosis Date Noted  . Weakness 11/03/2012    [name] PT, DPT   10/05/2016, 7:36 PM  [facility] [address] [place], [address] Phone: [phone]   Fax:  [phone]  Name: [name] MRN: [id number] Date of Birth: 1945-11-28

## 2016-10-05 NOTE — Patient Instructions (Signed)
Pt was recommended to continue supine R hip marching 5 repetitions at a time for 3 sets, not holding her breath. Pt verbalized and demonstrated understanding.

## 2016-10-07 ENCOUNTER — Ambulatory Visit: Payer: Medicare Other

## 2016-10-13 ENCOUNTER — Telehealth: Payer: Self-pay

## 2016-10-13 ENCOUNTER — Ambulatory Visit: Payer: Medicare Other

## 2016-10-13 NOTE — Telephone Encounter (Signed)
No show. Called patient who said that she wrote her schedule down for tomorrow. Rescheduled today's session for next Tuesday 10/20/16 at 1 pm. Pt was also informed of her next follow up appointments. Pt verbalized understanding.

## 2016-10-14 DIAGNOSIS — R5382 Chronic fatigue, unspecified: Secondary | ICD-10-CM | POA: Diagnosis not present

## 2016-10-14 DIAGNOSIS — K5901 Slow transit constipation: Secondary | ICD-10-CM | POA: Diagnosis not present

## 2016-10-14 DIAGNOSIS — G959 Disease of spinal cord, unspecified: Secondary | ICD-10-CM | POA: Diagnosis not present

## 2016-10-14 DIAGNOSIS — M792 Neuralgia and neuritis, unspecified: Secondary | ICD-10-CM | POA: Diagnosis not present

## 2016-10-20 ENCOUNTER — Ambulatory Visit: Payer: Medicare Other

## 2016-10-20 DIAGNOSIS — M6281 Muscle weakness (generalized): Secondary | ICD-10-CM

## 2016-10-20 DIAGNOSIS — M79605 Pain in left leg: Secondary | ICD-10-CM | POA: Diagnosis not present

## 2016-10-20 DIAGNOSIS — M25561 Pain in right knee: Secondary | ICD-10-CM | POA: Diagnosis not present

## 2016-10-20 DIAGNOSIS — M25562 Pain in left knee: Secondary | ICD-10-CM | POA: Diagnosis not present

## 2016-10-20 DIAGNOSIS — R262 Difficulty in walking, not elsewhere classified: Secondary | ICD-10-CM

## 2016-10-20 DIAGNOSIS — M79604 Pain in right leg: Secondary | ICD-10-CM | POA: Diagnosis not present

## 2016-10-20 NOTE — Therapy (Signed)
[facility] [address], [place], [address] Phone: [phone]   Fax:  [phone]  Physical Therapy Treatment  Patient Details  Name: [name] MRN: [id number] Date of Birth: April 16, 1945 Referring Provider: Shary Decamp, [place]  Encounter Date: 10/20/2016      PT End of Session - 10/20/16 1307    Visit Number 14   Number of Visits 25   Date for PT Re-Evaluation 10/29/16   Authorization Type 6   Authorization Time Period of 10 g code   PT Start Time 1307  pt arrived late   PT Stop Time 1357   PT Time Calculation (min) 50 min   Activity Tolerance Patient tolerated treatment well   Behavior During Therapy [facility] for tasks assessed/performed      Past Medical History:  Diagnosis Date  . Arthritis   . Depression     Past Surgical History:  Procedure Laterality Date  . ABDOMINAL HYSTERECTOMY  1995  . back sugery    . BUNIONECTOMY  2013   rt foot  . COLONOSCOPY    . MUSCLE BIOPSY Left 10/03/2012   Procedure: LEFT QUADRICEP MUSCLE BIOPSY;  Surgeon: Odis Hollingshead, MD;  Location: [place];  Service: General;  Laterality: Left;  . NECK SURGERY  2010   cerv disc fused     There were no vitals filed for this visit.      Subjective Assessment - 10/20/16 1309    Subjective R knee is not bad today. Just stinging today. 4/10 R knee currently. L knee is not bad, just burning and stinging (4/10 currently).  Picking up her R knee is ok, about the same. Was on it a lot this past weekend. Went to [facility].  The R knee is a little swollen.  Pt states that sometimes she can flex her R hip better, some days she cannot. Does not know what is going on.  Has been dealing with this for a while and it feels almost the same.    Pertinent History LE weakness.  Symptoms occured suddenly, unknown method of injury prior to her first neck fusion surgery on January 2010. Had lower back surgery  fusion in 2011.  The neck and back surgeries did not help. Pt states having increased urinary urgency and takes medication for for it. MD aware.  Denies saddle anesthesia.  Pt states that her doctor told her that PT is the only thing that is going to help her keep moving so she continues to participate in PT.  Last round of PT was last year which helped.  Currently has difficulty walking, performing chores (wash dishes, laundry), cooking. Better able to do her tasks a little bit when she does therapy but gets harder when she stops.  Feels burning and stinging in both her knees, and bilateral anterior and lateral legs.  Pt states not having back or neck pain. Just a stiff neck.  Pt states she usually walks with a cane on her R side. No falls within the last 6 months.     Patient Stated Goals Be better able to walk, and get around better without the stinging and burning in her knees and legs.    Currently in Pain? Yes   Pain Score 4    Pain Onset More than a month ago  PT Education - 10/20/16 1325    Education provided Yes   Education Details ther-ex   [employer]) Educated Patient   Methods Explanation;Demonstration;Tactile cues;Verbal cues   Comprehension Returned demonstration;Verbalized understanding         Objectives  There-ex  Sitting on dyna-disc x 3 min to promote trunk muscle use  Then with low rows resisting yellow band 10x with 5 second holds to promote abdominal muscle use  Then with red band 10x5 seconds  Then with green band 10x5 seconds    Then with L hip extension and R hip flexion 10x2 to promote R hip flexion strength for R LE swing phase of gait   Then with regular rows resisting green band 10x2 with 5 second holds   Then sitting with upright posture resisting gentle manual perturbation from PT 1 min x 3 via pt holding PVC bar  Reclined R hip flexion knee to chest AAROM with PT assist 10x2   Then 5x with  holds throughout range  Improved exercise technique, movement at target joints, use of target muscles after mod verbal, visual, tactile cues.    Pt was recommended to try using a heating pad for both lateral hamstring muscles at home to see if it would help with her knee symptoms. Pt verbalized understanding.     Manual therapy   STM R lumbar paraspinals to decrease tension with pt sitting, resting forehead on forearms on table  No change in ability to perform seated R hip flexion     Difficulty with hip flexion today. Slightly better able to perform seated R hip flexion with core muscle activation. Continued working on core strengthening and hip flexion strengthening to help improve ability to ambulate with better foot clearance during R LE swing phase of gait.          PT Long Term Goals - 09/28/16 1500      PT LONG TERM GOAL #1   Title Pt will be independent with her HEP to promote LE strength in function.   Time 4   Period Weeks   Status On-going     PT LONG TERM GOAL #2   Title Patient will improve bilateral LE strength by at least 1/2 MMT grade to promote ability to ambulate and perform functional tasks.    Time 8   Period Weeks   Status Achieved     PT LONG TERM GOAL #3   Title Patient will improve her LEFS score by at least 9 points as a demonstration of improved function.    Baseline 14/80 (07/29/2016)   Time 4   Period Weeks   Status On-going     PT LONG TERM GOAL #4   Title Pt will report being able to walk/stand with SPC over 35 min to promote mobility.    Baseline Pt states being able to walk for about 30 min (07/29/2016); about 1 hour per pt reports (09/07/2016)   Time 8   Period Weeks   Status Achieved     PT LONG TERM GOAL #5   Title Patient will have a decrease in bilateral LE pain to 5/10 or less at worst to promote ability to perform functional tasks.    Baseline 8/10 at worst (07/29/2016); Minimal burning sensation bilateral knee and legs, no pain  level number provided. Pt also states feeling pain in her knees during the weekend. No pain level provided (09/07/2016); 3/10 L leg burning sensation at most for the past 7 days (09/28/2016)   Time  8   Period Weeks   Status Achieved     Additional Long Term Goals   Additional Long Term Goals Yes     PT LONG TERM GOAL #6   Title Pt will improve seated R hip flexion strength to at least 3+/5 to promote ability to ambulate and perform standing tasks.    Baseline 2+/5 seated R hip flexion (09/28/2016)   Time 4   Period Weeks   Status New               Plan - 10/20/16 1306    Clinical Impression Statement Difficulty with hip flexion today. Slightly better able to perform seated R hip flexion with core muscle activation. Continued working on core strengthening and hip flexion strengthening to help improve ability to ambulate with better foot clearance during R LE swing phase of gait.    History and Personal Factors relevant to plan of care: Chronicity of condition, weakness   Clinical Presentation Evolving   Clinical Presentation due to: Sometimes pt able to perform supine/reclined R hip flexion well, today, pt demonstrates more difficulty    Clinical Decision Making Low   Rehab Potential Fair   Clinical Impairments Affecting Rehab Potential Chronicity of condition, posible decreasing motivation (observed)   PT Frequency 2x / week   PT Duration 4 weeks   PT Treatment/Interventions Electrical Stimulation;Aquatic Therapy;Ultrasound;Gait training;Functional mobility training;Therapeutic activities;Therapeutic exercise;Balance training;Neuromuscular re-education;Patient/family education;Manual techniques;Dry needling   PT Next Visit Plan LE strengthening, modalities PRN   Consulted and Agree with Plan of Care Patient      Patient will benefit from skilled therapeutic intervention in order to improve the following deficits and impairments:  Pain, Abnormal gait, Decreased balance, Decreased  range of motion, Decreased strength, Difficulty walking  Visit Diagnosis: Muscle weakness (generalized)  Difficulty in walking, not elsewhere classified     Problem List Patient Active Problem List   Diagnosis Date Noted  . Weakness 11/03/2012    [name] PT, DPT   10/20/2016, 2:19 PM  [facility] [address], [place], [address] Phone: [phone]   Fax:  [phone]  Name: VALENA IVANOV MRN: [id number] Date of Birth: October 30, 1945

## 2016-10-22 ENCOUNTER — Ambulatory Visit: Payer: Medicare Other | Attending: Physician Assistant

## 2016-10-22 DIAGNOSIS — M79605 Pain in left leg: Secondary | ICD-10-CM | POA: Diagnosis not present

## 2016-10-22 DIAGNOSIS — M25561 Pain in right knee: Secondary | ICD-10-CM | POA: Diagnosis not present

## 2016-10-22 DIAGNOSIS — M6281 Muscle weakness (generalized): Secondary | ICD-10-CM | POA: Insufficient documentation

## 2016-10-22 DIAGNOSIS — M25562 Pain in left knee: Secondary | ICD-10-CM | POA: Diagnosis not present

## 2016-10-22 DIAGNOSIS — R262 Difficulty in walking, not elsewhere classified: Secondary | ICD-10-CM | POA: Diagnosis not present

## 2016-10-22 DIAGNOSIS — M79604 Pain in right leg: Secondary | ICD-10-CM | POA: Diagnosis not present

## 2016-10-22 NOTE — Therapy (Signed)
[facility] [address] [place], [address] Phone: [phone]   Fax:  [phone]  Physical Therapy Treatment  Patient Details  Name: [name] MRN: [id number] Date of Birth: 06-18-1945 Referring Provider: Shary Decamp, [place]  Encounter Date: 10/22/2016      PT End of Session - 10/22/16 1444    Visit Number 15   Number of Visits 25   Date for PT Re-Evaluation 10/29/16   Authorization Type 7   Authorization Time Period of 10 g code   PT Start Time 1445  pt arrived late   PT Stop Time 1525   PT Time Calculation (min) 40 min   Activity Tolerance Patient tolerated treatment well   Behavior During Therapy [facility] for tasks assessed/performed      Past Medical History:  Diagnosis Date  . Arthritis   . Depression     Past Surgical History:  Procedure Laterality Date  . ABDOMINAL HYSTERECTOMY  1995  . back sugery    . BUNIONECTOMY  2013   rt foot  . COLONOSCOPY    . MUSCLE BIOPSY Left 10/03/2012   Procedure: LEFT QUADRICEP MUSCLE BIOPSY;  Surgeon: Odis Hollingshead, MD;  Location: [place];  Service: General;  Laterality: Left;  . NECK SURGERY  2010   cerv disc fused     There were no vitals filed for this visit.      Subjective Assessment - 10/22/16 1446    Subjective Feeling ok. Knees hurt. 6/10 R knee, and L knee currently. Able to perform R hip flexion when laying on her L side.    Pertinent History LE weakness.  Symptoms occured suddenly, unknown method of injury prior to her first neck fusion surgery on January 2010. Had lower back surgery fusion in 2011.  The neck and back surgeries did not help. Pt states having increased urinary urgency and takes medication for for it. MD aware.  Denies saddle anesthesia.  Pt states that her doctor told her that PT is the only thing that is going to help her keep moving so she continues to participate in PT.  Last round of PT was last  year which helped.  Currently has difficulty walking, performing chores (wash dishes, laundry), cooking. Better able to do her tasks a little bit when she does therapy but gets harder when she stops.  Feels burning and stinging in both her knees, and bilateral anterior and lateral legs.  Pt states not having back or neck pain. Just a stiff neck.  Pt states she usually walks with a cane on her R side. No falls within the last 6 months.     Patient Stated Goals Be better able to walk, and get around better without the stinging and burning in her knees and legs.    Currently in Pain? Yes   Pain Score 6    Pain Onset More than a month ago                                 PT Education - 10/22/16 1455    Education provided Yes   Education Details ther-ex   [employer]) Educated Patient   Methods Explanation;Demonstration;Tactile cues;Verbal cues   Comprehension Returned demonstration;Verbalized understanding        Objectives  There-ex  Sitting on dyna-disc x 2 min to promote trunk muscle use  Then with low rows resisting red band 10x with 5 second holds to promote abdominal muscle use             Then with green band 10x5 seconds for 2 sets               Then with L hip extension and R hip flexion with PT assistance to end range 5x3 with 2-3 second holds to promote R hip flexion strength for R LE swing phase of gait  Then with pt assisting her R hip flexion herself 5x3 seconds    Then with seated hip adduction small physioball squeeze with glute max squeeze and bilateral knee extension isometrics 10x3 with 5 second holds  Decreased knee pain  Standing R hip flexion 10x2  stading R leg press resisting double blue band (with assisted hip flexion from band) with bilateral UE assist 10x, CGA from PT  Standing R toe taps onto treadmill platform 10x2 to promote hip flexion   Improved exercise technique, movement at target joints, use of target muscles  after min to mod verbal, visual, tactile cues.    Decreased bilateral knee pain when sitting and with seated hip adduction ball squeeze. Improved R hip flexion AROM after working on core strengthening and activating R hip flexor muscles with PT assist to end range.           PT Long Term Goals - 09/28/16 1500      PT LONG TERM GOAL #1   Title Pt will be independent with her HEP to promote LE strength in function.   Time 4   Period Weeks   Status On-going     PT LONG TERM GOAL #2   Title Patient will improve bilateral LE strength by at least 1/2 MMT grade to promote ability to ambulate and perform functional tasks.    Time 8   Period Weeks   Status Achieved     PT LONG TERM GOAL #3   Title Patient will improve her LEFS score by at least 9 points as a demonstration of improved function.    Baseline 14/80 (07/29/2016)   Time 4   Period Weeks   Status On-going     PT LONG TERM GOAL #4   Title Pt will report being able to walk/stand with SPC over 35 min to promote mobility.    Baseline Pt states being able to walk for about 30 min (07/29/2016); about 1 hour per pt reports (09/07/2016)   Time 8   Period Weeks   Status Achieved     PT LONG TERM GOAL #5   Title Patient will have a decrease in bilateral LE pain to 5/10 or less at worst to promote ability to perform functional tasks.    Baseline 8/10 at worst (07/29/2016); Minimal burning sensation bilateral knee and legs, no pain level number provided. Pt also states feeling pain in her knees during the weekend. No pain level provided (09/07/2016); 3/10 L leg burning sensation at most for the past 7 days (09/28/2016)   Time 8   Period Weeks   Status Achieved     Additional Long Term Goals   Additional Long Term Goals Yes     PT LONG TERM GOAL #6   Title Pt will improve seated R hip flexion strength to at least 3+/5 to promote ability to ambulate and perform standing tasks.    Baseline 2+/5 seated R hip flexion (09/28/2016)   Time 4    Period Weeks  Status New               Plan - 10/22/16 1455    Clinical Impression Statement Decreased bilateral knee pain when sitting and with seated hip adduction ball squeeze. Improved R hip flexion AROM after working on core strengthening and activating R hip flexor muscles with PT assist to end range.    History and Personal Factors relevant to plan of care: Chronicity of condition, weakness   Clinical Presentation Stable   Clinical Presentation due to: better able to perform R hip flexion today   Clinical Decision Making Low   Rehab Potential Fair   Clinical Impairments Affecting Rehab Potential Chronicity of condition, posible decreasing motivation (observed)   PT Frequency 2x / week   PT Duration 4 weeks   PT Treatment/Interventions Electrical Stimulation;Aquatic Therapy;Ultrasound;Gait training;Functional mobility training;Therapeutic activities;Therapeutic exercise;Balance training;Neuromuscular re-education;Patient/family education;Manual techniques;Dry needling   PT Next Visit Plan LE strengthening, modalities PRN   Consulted and Agree with Plan of Care Patient      Patient will benefit from skilled therapeutic intervention in order to improve the following deficits and impairments:  Pain, Abnormal gait, Decreased balance, Decreased range of motion, Decreased strength, Difficulty walking  Visit Diagnosis: Muscle weakness (generalized)  Difficulty in walking, not elsewhere classified  Right knee pain, unspecified chronicity  Left knee pain, unspecified chronicity     Problem List Patient Active Problem List   Diagnosis Date Noted  . Weakness 11/03/2012    [name] PT, DPT   10/22/2016, 6:19 PM  [facility] [address], [place], [address] Phone: [phone]   Fax:  [phone]  Name: JAYDIN JALOMO MRN: [id number] Date of Birth: Mar 17, 1946

## 2016-10-27 ENCOUNTER — Ambulatory Visit: Payer: Medicare Other

## 2016-10-27 ENCOUNTER — Telehealth: Payer: Self-pay

## 2016-10-27 NOTE — Telephone Encounter (Signed)
No show. Called patient cell phone and left a message pertaining to today's appointment, and a reminder for her next follow up session. Return phone call requested. Phone number [phone] provided.

## 2016-10-29 ENCOUNTER — Ambulatory Visit: Payer: Medicare Other

## 2016-10-29 DIAGNOSIS — M25561 Pain in right knee: Secondary | ICD-10-CM | POA: Diagnosis not present

## 2016-10-29 DIAGNOSIS — M79605 Pain in left leg: Secondary | ICD-10-CM | POA: Diagnosis not present

## 2016-10-29 DIAGNOSIS — R262 Difficulty in walking, not elsewhere classified: Secondary | ICD-10-CM

## 2016-10-29 DIAGNOSIS — M6281 Muscle weakness (generalized): Secondary | ICD-10-CM

## 2016-10-29 DIAGNOSIS — M25562 Pain in left knee: Secondary | ICD-10-CM

## 2016-10-29 DIAGNOSIS — M79604 Pain in right leg: Secondary | ICD-10-CM

## 2016-10-29 NOTE — Therapy (Signed)
[place] [facility] [address], [place], [address] Phone: [phone]   Fax:  [phone]  Physical Therapy Treatment And Progress Report  (G-Code)  Patient Details  Name: [name] MRN: [id number] Date of Birth: 06-Feb-1946 Referring Provider: Shary Decamp, [place]  Encounter Date: 10/29/2016      PT End of Session - 10/29/16 1511    Visit Number 16   Number of Visits 25   Date for PT Re-Evaluation 12/10/16   Authorization Type 1   Authorization Time Period of 10 g code   PT Start Time 2025  pt arrived late   PT Stop Time 1545   PT Time Calculation (min) 34 min   Activity Tolerance Patient tolerated treatment well   Behavior During Therapy [facility] for tasks assessed/performed      Past Medical History:  Diagnosis Date  . Arthritis   . Depression     Past Surgical History:  Procedure Laterality Date  . ABDOMINAL HYSTERECTOMY  1995  . back sugery    . BUNIONECTOMY  2013   rt foot  . COLONOSCOPY    . MUSCLE BIOPSY Left 10/03/2012   Procedure: LEFT QUADRICEP MUSCLE BIOPSY;  Surgeon: Odis Hollingshead, MD;  Location: [place];  Service: General;  Laterality: Left;  . NECK SURGERY  2010   cerv disc fused     There were no vitals filed for this visit.      Subjective Assessment - 10/29/16 1513    Subjective Did not know she had an appointment last time. Both knees feel like its burning. Her primary doctor gave her medication for that but did not take it. No knee pain, just burning.  8/10 bilateral knee burning sensation at most for the past 7 days.    Pertinent History LE weakness.  Symptoms occured suddenly, unknown method of injury prior to her first neck fusion surgery on January 2010. Had lower back surgery fusion in 2011.  The neck and back surgeries did not help. Pt states having increased urinary urgency and takes medication for for it. MD aware.  Denies saddle anesthesia.  Pt  states that her doctor told her that PT is the only thing that is going to help her keep moving so she continues to participate in PT.  Last round of PT was last year which helped.  Currently has difficulty walking, performing chores (wash dishes, laundry), cooking. Better able to do her tasks a little bit when she does therapy but gets harder when she stops.  Feels burning and stinging in both her knees, and bilateral anterior and lateral legs.  Pt states not having back or neck pain. Just a stiff neck.  Pt states she usually walks with a cane on her R side. No falls within the last 6 months.     Patient Stated Goals Be better able to walk, and get around better without the stinging and burning in her knees and legs.    Currently in Pain? Yes   Pain Score 7   knee and L lateral leg burning    Pain Onset More than a month ago            [facility] PT Assessment - 10/29/16 1533      Observation/Other Assessments   Lower Extremity Functional Scale  18/80     Strength   Right Hip Flexion 2+/5  PT Education - 10/29/16 1748    Education provided Yes   Education Details ther-ex, plan of care   Person(s) Educated Patient   Methods Explanation;Demonstration;Tactile cues;Verbal cues   Comprehension Returned demonstration;Verbalized understanding        Objectives  No knee pain, just burning.  8/10 bilateral knee burning sensation at most for the past 7 days  There-ex   seated hip adduction small physioball squeeze with glute max squeeze and bilateral knee extension isometrics 10x3 with 5 second holds  Decreased knee burning senstion  Reviewed plan of care 2x/week for 6 weeks.   Seated R hip flexion AAROM to end range with 2 seconds holds throughout 5x2  Then with sitting on dyna disc 5x  Sitting on dyna disc  Low rows resisting green band 10x2 with 5 second holds   Seated L hip extension isometrics 10x2 with  R hip flexion AAROM  with PT   Improved exercise technique, movement at target joints, use of target muscles after min to mod verbal, visual, tactile cues.   Pt demonstrates decreased bilateral femoral control with performing closed chain tasks which could contribute to her bilateral knee burning sensation. Decreased symptoms with activation of hip and VMO muscles. Continued working on R hip strengthening and core activation to promote ability to raise her R thigh when walking. Slight improvement in LEFS score by 4 points since last measured suggesting improved function. Patient still demonstrates LE weakness, especially with her R hip flexor muscles, bilateral knee discomfort, decreased femoral control and difficulty performing functional tasks and would benefit from continued skilled physical therapy services to address the aforementioned deficits.            PT Long Term Goals - 10/29/16 1750      PT LONG TERM GOAL #1   Title Pt will be independent with her HEP to promote LE strength in function.   Time 6   Period Weeks   Status On-going   Target Date 12/10/16     PT LONG TERM GOAL #2   Title Patient will improve bilateral LE strength by at least 1/2 MMT grade to promote ability to ambulate and perform functional tasks.    Time 8   Period Weeks   Status Achieved     PT LONG TERM GOAL #3   Title Patient will improve her LEFS score by at least 9 points as a demonstration of improved function.    Baseline 14/80 (07/29/2016); 18/80 (10/29/2016)   Time 6   Period Weeks   Status On-going   Target Date 12/10/16     PT LONG TERM GOAL #4   Title Pt will report being able to walk/stand with SPC over 35 min to promote mobility.    Baseline Pt states being able to walk for about 30 min (07/29/2016); about 1 hour per pt reports (09/07/2016)   Time 8   Period Weeks   Status Achieved     PT LONG TERM GOAL #5   Title Patient will have a decrease in bilateral LE pain to 5/10 or less at worst to promote ability to  perform functional tasks.    Baseline 8/10 at worst (07/29/2016); Minimal burning sensation bilateral knee and legs, no pain level number provided. Pt also states feeling pain in her knees during the weekend. No pain level provided (09/07/2016); 3/10 L leg burning sensation at most for the past 7 days (09/28/2016); bilateral knee burning sensation 8/10 at worst (10/29/2016)   Time 6  Period Weeks   Status On-going   Target Date 12/10/16     PT LONG TERM GOAL #6   Title Pt will improve seated R hip flexion strength to at least 3+/5 to promote ability to ambulate and perform standing tasks.    Baseline 2+/5 seated R hip flexion (09/28/2016), (11/12/2016)   Time 6   Period Weeks   Status On-going               Plan - 11/12/16 1510    Clinical Impression Statement Pt demonstrates decreased bilateral femoral control with performing closed chain tasks which could contribute to her bilateral knee burning sensation. Decreased symptoms with activation of hip and VMO muscles. Continued working on R hip strengthening and core activation to promote ability to raise her R thigh when walking. Slight improvement in LEFS score by 4 points since last measured suggesting improved function. Patient still demonstrates LE weakness, especially with her R hip flexor muscles, bilateral knee discomfort, decreased femoral control and difficulty performing functional tasks and would benefit from continued skilled physical therapy services to address the aforementioned deficits.    History and Personal Factors relevant to plan of care: Chronicity of condition, weakness   Clinical Presentation Stable   Clinical Presentation due to: maintain 2+/5 R hip flexion strength, decreased knee symptoms after exercises.    Clinical Decision Making Low   Rehab Potential Fair   Clinical Impairments Affecting Rehab Potential Chronicity of condition, posible decreasing motivation (observed)   PT Frequency 2x / week   PT Duration 4 weeks    PT Treatment/Interventions Electrical Stimulation;Aquatic Therapy;Ultrasound;Gait training;Functional mobility training;Therapeutic activities;Therapeutic exercise;Balance training;Neuromuscular re-education;Patient/family education;Manual techniques;Dry needling   PT Next Visit Plan LE strengthening, modalities PRN   Consulted and Agree with Plan of Care Patient      Patient will benefit from skilled therapeutic intervention in order to improve the following deficits and impairments:  Pain, Abnormal gait, Decreased balance, Decreased range of motion, Decreased strength, Difficulty walking  Visit Diagnosis: Muscle weakness (generalized) - Plan: PT plan of care cert/re-cert  Difficulty in walking, not elsewhere classified - Plan: PT plan of care cert/re-cert  Right knee pain, unspecified chronicity - Plan: PT plan of care cert/re-cert  Left knee pain, unspecified chronicity - Plan: PT plan of care cert/re-cert  Pain in left leg - Plan: PT plan of care cert/re-cert  Pain in right leg - Plan: PT plan of care cert/re-cert       G-Codes - November 12, 2016 1753    Functional Assessment Tool Used (Outpatient Only) LEFS, clinical presentation, patient interview   Functional Limitation Mobility: Walking and moving around   Mobility: Walking and Moving Around Current Status ([id number]) At least 60 percent but less than 80 percent impaired, limited or restricted   Mobility: Walking and Moving Around Goal Status [phone]) At least 40 percent but less than 60 percent impaired, limited or restricted      Problem List Patient Active Problem List   Diagnosis Date Noted  . Weakness 11/03/2012   Thank you for your referral.  [name] PT, DPT    11/12/2016, 5:59 PM  [place] [address], [place], [address] Phone: [phone]   Fax:  [phone]  Name: [name] MRN: [id number] Date of Birth: [birth year]

## 2016-11-04 ENCOUNTER — Ambulatory Visit: Payer: Medicare Other

## 2016-11-04 DIAGNOSIS — M79605 Pain in left leg: Secondary | ICD-10-CM | POA: Diagnosis not present

## 2016-11-04 DIAGNOSIS — M25562 Pain in left knee: Secondary | ICD-10-CM | POA: Diagnosis not present

## 2016-11-04 DIAGNOSIS — M6281 Muscle weakness (generalized): Secondary | ICD-10-CM | POA: Diagnosis not present

## 2016-11-04 DIAGNOSIS — R262 Difficulty in walking, not elsewhere classified: Secondary | ICD-10-CM

## 2016-11-04 DIAGNOSIS — M79604 Pain in right leg: Secondary | ICD-10-CM | POA: Diagnosis not present

## 2016-11-04 DIAGNOSIS — M25561 Pain in right knee: Secondary | ICD-10-CM | POA: Diagnosis not present

## 2016-11-04 NOTE — Therapy (Signed)
[place] [facility] [address] [place], [address] Phone: [phone]   Fax:  [phone]  Physical Therapy Treatment  Patient Details  Name: [name] MRN: [id number] Date of Birth: [birth year] Referring Provider: Shary Decamp, [place]  Encounter Date: 11/04/2016      PT End of Session - 11/04/16 1040    Visit Number 17   Number of Visits 25   Date for PT Re-Evaluation 12/10/16   Authorization Type 2   Authorization Time Period of 10 g code   PT Start Time 1040  pt arrived late   PT Stop Time 1120   PT Time Calculation (min) 40 min   Activity Tolerance Patient tolerated treatment well   Behavior During Therapy [facility] for tasks assessed/performed      Past Medical History:  Diagnosis Date  . Arthritis   . Depression     Past Surgical History:  Procedure Laterality Date  . ABDOMINAL HYSTERECTOMY  1995  . back sugery    . BUNIONECTOMY  2013   rt foot  . COLONOSCOPY    . MUSCLE BIOPSY Left 10/03/2012   Procedure: LEFT QUADRICEP MUSCLE BIOPSY;  Surgeon: Odis Hollingshead, MD;  Location: [place];  Service: General;  Laterality: Left;  . NECK SURGERY  2010   cerv disc fused     There were no vitals filed for this visit.      Subjective Assessment - 11/04/16 1040    Subjective Got the ball at the store the other day and used it for exercise (seated hip adduction). Knee burning is less.    Pertinent History LE weakness.  Symptoms occured suddenly, unknown method of injury prior to her first neck fusion surgery on January 2010. Had lower back surgery fusion in 2011.  The neck and back surgeries did not help. Pt states having increased urinary urgency and takes medication for for it. MD aware.  Denies saddle anesthesia.  Pt states that her doctor told her that PT is the only thing that is going to help her keep moving so she continues to participate in PT.  Last round of PT was last year  which helped.  Currently has difficulty walking, performing chores (wash dishes, laundry), cooking. Better able to do her tasks a little bit when she does therapy but gets harder when she stops.  Feels burning and stinging in both her knees, and bilateral anterior and lateral legs.  Pt states not having back or neck pain. Just a stiff neck.  Pt states she usually walks with a cane on her R side. No falls within the last 6 months.     Patient Stated Goals Be better able to walk, and get around better without the stinging and burning in her knees and legs.    Currently in Pain? No/denies   Pain Score 0-No pain   Pain Onset More than a month ago                                 PT Education - 11/04/16 1307    Education provided Yes   Education Details ther-ex   [employer]) Educated Patient   Methods Explanation;Demonstration;Tactile cues;Verbal cues   Comprehension Returned demonstration;Verbalized understanding        Objectives There-ex  Sitting on dyna disc             Low rows  resisting green band 10x with 5 second holds   Then 10x10 seconds              Seated  R hip flexion AAROM with PT 8x2 with holds at different ranges    Seated bilateral shoulder extension resisting green band 10x5 seconds   Bilateral shoulder extension isometrics 10x5 seconds for 2 sets   Standing hip abduction with bilateral UE assist 10x2 each side Standing mini squats 10x with emphasis on femoral control with bilateral UE assist  Standing alternating toe taps onto treadmill platform 10x each LE  Standing leg press R LE with bilateral UE assist resisting double blue band 10x3. Cues for control and hip flexion  Improved exercise technique, movement at target joints, use of target muscles after min to mod verbal, visual, tactile cues.    Good carry over of decreased bilateral knee symptoms from previous session. No complain of bilateral knee pain today. Continued working on  core strengthening and hip flexor muscle strengthening to promote ability to ambulate with improved foot clearance and step length.            PT Long Term Goals - 10/29/16 1750      PT LONG TERM GOAL #1   Title Pt will be independent with her HEP to promote LE strength in function.   Time 6   Period Weeks   Status On-going   Target Date 12/10/16     PT LONG TERM GOAL #2   Title Patient will improve bilateral LE strength by at least 1/2 MMT grade to promote ability to ambulate and perform functional tasks.    Time 8   Period Weeks   Status Achieved     PT LONG TERM GOAL #3   Title Patient will improve her LEFS score by at least 9 points as a demonstration of improved function.    Baseline 14/80 (07/29/2016); 18/80 (10/29/2016)   Time 6   Period Weeks   Status On-going   Target Date 12/10/16     PT LONG TERM GOAL #4   Title Pt will report being able to walk/stand with SPC over 35 min to promote mobility.    Baseline Pt states being able to walk for about 30 min (07/29/2016); about 1 hour per pt reports (09/07/2016)   Time 8   Period Weeks   Status Achieved     PT LONG TERM GOAL #5   Title Patient will have a decrease in bilateral LE pain to 5/10 or less at worst to promote ability to perform functional tasks.    Baseline 8/10 at worst (07/29/2016); Minimal burning sensation bilateral knee and legs, no pain level number provided. Pt also states feeling pain in her knees during the weekend. No pain level provided (09/07/2016); 3/10 L leg burning sensation at most for the past 7 days (09/28/2016); bilateral knee burning sensation 8/10 at worst (10/29/2016)   Time 6   Period Weeks   Status On-going   Target Date 12/10/16     PT LONG TERM GOAL #6   Title Pt will improve seated R hip flexion strength to at least 3+/5 to promote ability to ambulate and perform standing tasks.    Baseline 2+/5 seated R hip flexion (09/28/2016), (10/29/2016)   Time 6   Period Weeks   Status On-going                Plan - 11/04/16 1307    Clinical Impression Statement Good carry over of decreased  bilateral knee symptoms from previous session. No complain of bilateral knee pain today. Continued working on core strengthening and hip flexor muscle strengthening to promote ability to ambulate with improved foot clearance and step length.     History and Personal Factors relevant to plan of care: Chronicity of condition, weakness   Clinical Presentation Stable   Clinical Presentation due to: good carry over of decreased bilateral knee pain    Clinical Decision Making Low   Rehab Potential Fair   Clinical Impairments Affecting Rehab Potential Chronicity of condition, posible decreasing motivation (observed)   PT Frequency 2x / week   PT Duration 4 weeks   PT Treatment/Interventions Electrical Stimulation;Aquatic Therapy;Ultrasound;Gait training;Functional mobility training;Therapeutic activities;Therapeutic exercise;Balance training;Neuromuscular re-education;Patient/family education;Manual techniques;Dry needling   PT Next Visit Plan LE strengthening, modalities PRN   Consulted and Agree with Plan of Care Patient      Patient will benefit from skilled therapeutic intervention in order to improve the following deficits and impairments:  Pain, Abnormal gait, Decreased balance, Decreased range of motion, Decreased strength, Difficulty walking  Visit Diagnosis: Muscle weakness (generalized)  Difficulty in walking, not elsewhere classified     Problem List Patient Active Problem List   Diagnosis Date Noted  . Weakness 11/03/2012    [name] PT, DPT   11/04/2016, 1:13 PM  [place] [facility] [address], [place], [address] Phone: [phone]   Fax:  [phone]  Name: Nichole DECOURSEY MRN: [id number] Date of Birth: Aug 08, 1945

## 2016-11-11 ENCOUNTER — Ambulatory Visit: Payer: Medicare Other

## 2016-11-11 DIAGNOSIS — R262 Difficulty in walking, not elsewhere classified: Secondary | ICD-10-CM

## 2016-11-11 DIAGNOSIS — M25561 Pain in right knee: Secondary | ICD-10-CM | POA: Diagnosis not present

## 2016-11-11 DIAGNOSIS — M79605 Pain in left leg: Secondary | ICD-10-CM | POA: Diagnosis not present

## 2016-11-11 DIAGNOSIS — M79604 Pain in right leg: Secondary | ICD-10-CM | POA: Diagnosis not present

## 2016-11-11 DIAGNOSIS — M6281 Muscle weakness (generalized): Secondary | ICD-10-CM | POA: Diagnosis not present

## 2016-11-11 DIAGNOSIS — M25562 Pain in left knee: Secondary | ICD-10-CM | POA: Diagnosis not present

## 2016-11-11 NOTE — Therapy (Signed)
[place] [facility] [address], [place], [address] Phone: [phone]   Fax:  [phone]  Physical Therapy Treatment  Patient Details  Name: [name] MRN: [id number] Date of Birth: 08-12-1945 Referring Provider: Shary Decamp, [place]  Encounter Date: 11/11/2016      PT End of Session - 11/11/16 1337    Visit Number 18   Number of Visits 25   Date for PT Re-Evaluation 12/10/16   Authorization Type 3   Authorization Time Period of 10 g code   PT Start Time 1338   PT Stop Time 1418   PT Time Calculation (min) 40 min   Activity Tolerance Patient tolerated treatment well   Behavior During Therapy [facility] for tasks assessed/performed      Past Medical History:  Diagnosis Date  . Arthritis   . Depression     Past Surgical History:  Procedure Laterality Date  . ABDOMINAL HYSTERECTOMY  1995  . back sugery    . BUNIONECTOMY  2013   rt foot  . COLONOSCOPY    . MUSCLE BIOPSY Left 10/03/2012   Procedure: LEFT QUADRICEP MUSCLE BIOPSY;  Surgeon: Odis Hollingshead, MD;  Location: [facility];  Service: General;  Laterality: Left;  . NECK SURGERY  2010   cerv disc fused     There were no vitals filed for this visit.      Subjective Assessment - 11/11/16 1340    Subjective Both knees are the same. Went on a road trip to [place] and her knees were stiff. No pain in her knees. The burning in her knees are normal.  Feeling sick and tired of being sick and tired.    Pertinent History LE weakness.  Symptoms occured suddenly, unknown method of injury prior to her first neck fusion surgery on January 2010. Had lower back surgery fusion in 2011.  The neck and back surgeries did not help. Pt states having increased urinary urgency and takes medication for for it. MD aware.  Denies saddle anesthesia.  Pt states that her doctor told her that PT is the only thing that is going to help her keep moving so she  continues to participate in PT.  Last round of PT was last year which helped.  Currently has difficulty walking, performing chores (wash dishes, laundry), cooking. Better able to do her tasks a little bit when she does therapy but gets harder when she stops.  Feels burning and stinging in both her knees, and bilateral anterior and lateral legs.  Pt states not having back or neck pain. Just a stiff neck.  Pt states she usually walks with a cane on her R side. No falls within the last 6 months.     Patient Stated Goals Be better able to walk, and get around better without the stinging and burning in her knees and legs.    Currently in Pain? No/denies   Pain Score 0-No pain   Pain Onset More than a month ago                                 PT Education - 11/11/16 1345    Education provided Yes   Education Details ther-ex   [employer]) Educated Patient   Methods Explanation;Demonstration;Tactile cues;Verbal cues   Comprehension Returned demonstration;Verbalized understanding        Objectives There-ex  Sitting on dyna disc  Bilateral shoulder extension resisting green band 10x5 seconds for 2 sets  Low rows resisting green band 10x with 5 second holds                         Then 10x10 seconds  Seated R hip flexion AAROM with PT 8x2 with holds at different ranges with L hip extension isometrics      Bilateral shoulder extension isometrics 10x5 seconds for 2 sets             Standing leg press R LE with bilateral UE assist resisting double blue band 10x3. Cues for control and hip flexion   Standing hip abduction with bilateral UE assist 10x2 each side  Recommended an AFO for R foot to promote better foot clearance during gait. Pt states that she has something that might be similar at home which helps her walk better. Requested for pt to bring it in next visit to verify. Pt verbalized understanding.    Try core muscle  strengthening in standing next visit if appropriate   Improved exercise technique, movement at target joints, use of target muscles after min to mod verbal, visual, tactile cues.     Better able to perform seated hip flexion with core muscle activation. Continued working on core strengthening, and hip flexion and abduction strengthening to promote better foot clearance during gait.           PT Long Term Goals - 10/29/16 1750      PT LONG TERM GOAL #1   Title Pt will be independent with her HEP to promote LE strength in function.   Time 6   Period Weeks   Status On-going   Target Date 12/10/16     PT LONG TERM GOAL #2   Title Patient will improve bilateral LE strength by at least 1/2 MMT grade to promote ability to ambulate and perform functional tasks.    Time 8   Period Weeks   Status Achieved     PT LONG TERM GOAL #3   Title Patient will improve her LEFS score by at least 9 points as a demonstration of improved function.    Baseline 14/80 (07/29/2016); 18/80 (10/29/2016)   Time 6   Period Weeks   Status On-going   Target Date 12/10/16     PT LONG TERM GOAL #4   Title Pt will report being able to walk/stand with SPC over 35 min to promote mobility.    Baseline Pt states being able to walk for about 30 min (07/29/2016); about 1 hour per pt reports (09/07/2016)   Time 8   Period Weeks   Status Achieved     PT LONG TERM GOAL #5   Title Patient will have a decrease in bilateral LE pain to 5/10 or less at worst to promote ability to perform functional tasks.    Baseline 8/10 at worst (07/29/2016); Minimal burning sensation bilateral knee and legs, no pain level number provided. Pt also states feeling pain in her knees during the weekend. No pain level provided (09/07/2016); 3/10 L leg burning sensation at most for the past 7 days (09/28/2016); bilateral knee burning sensation 8/10 at worst (10/29/2016)   Time 6   Period Weeks   Status On-going   Target Date 12/10/16     PT LONG  TERM GOAL #6   Title Pt will improve seated R hip flexion strength to at least 3+/5 to promote ability to ambulate and perform  standing tasks.    Baseline 2+/5 seated R hip flexion (09/28/2016), (10/29/2016)   Time 6   Period Weeks   Status On-going               Plan - 11/11/16 1345    Clinical Impression Statement Better able to perform seated hip flexion with core muscle activation. Continued working on core strengthening, and hip flexion and abduction strengthening to promote better foot clearance during gait.    History and Personal Factors relevant to plan of care: Chronicity of condition, weakness   Clinical Presentation Stable   Clinical Presentation due to: better able to perform hip flexion with core muscle activation   Clinical Decision Making Low   Rehab Potential Fair   Clinical Impairments Affecting Rehab Potential Chronicity of condition, posible decreasing motivation (observed)   PT Frequency 2x / week   PT Duration 4 weeks   PT Treatment/Interventions Electrical Stimulation;Aquatic Therapy;Ultrasound;Gait training;Functional mobility training;Therapeutic activities;Therapeutic exercise;Balance training;Neuromuscular re-education;Patient/family education;Manual techniques;Dry needling   PT Next Visit Plan LE strengthening, modalities PRN   Consulted and Agree with Plan of Care Patient      Patient will benefit from skilled therapeutic intervention in order to improve the following deficits and impairments:  Pain, Abnormal gait, Decreased balance, Decreased range of motion, Decreased strength, Difficulty walking  Visit Diagnosis: Muscle weakness (generalized)  Difficulty in walking, not elsewhere classified     Problem List Patient Active Problem List   Diagnosis Date Noted  . Weakness 11/03/2012    [name] PT, DPT  11/11/2016, 3:22 PM  [place] [facility] [address] [place],  [address] Phone: [phone]   Fax:  [phone]  Name: [name] MRN: [id number] Date of Birth: [birth year]

## 2016-11-17 ENCOUNTER — Ambulatory Visit: Payer: Medicare Other

## 2016-11-24 ENCOUNTER — Encounter

## 2016-11-30 ENCOUNTER — Ambulatory Visit: Payer: Medicare Other

## 2016-12-02 ENCOUNTER — Ambulatory Visit: Payer: Medicare Other | Attending: Physician Assistant

## 2016-12-02 DIAGNOSIS — R262 Difficulty in walking, not elsewhere classified: Secondary | ICD-10-CM

## 2016-12-02 DIAGNOSIS — M6281 Muscle weakness (generalized): Secondary | ICD-10-CM

## 2016-12-02 DIAGNOSIS — M79604 Pain in right leg: Secondary | ICD-10-CM | POA: Insufficient documentation

## 2016-12-02 DIAGNOSIS — M25561 Pain in right knee: Secondary | ICD-10-CM | POA: Insufficient documentation

## 2016-12-02 DIAGNOSIS — M25562 Pain in left knee: Secondary | ICD-10-CM | POA: Diagnosis not present

## 2016-12-02 DIAGNOSIS — L309 Dermatitis, unspecified: Secondary | ICD-10-CM | POA: Diagnosis not present

## 2016-12-02 DIAGNOSIS — M79605 Pain in left leg: Secondary | ICD-10-CM | POA: Diagnosis not present

## 2016-12-02 NOTE — Therapy (Signed)
[place] [facility] [address], [place], [address] Phone: [phone]   Fax:  [phone]  Physical Therapy Treatment  Patient Details  Name: [name] MRN: [id number] Date of Birth: [birth year] Referring Provider: Shary Decamp, [place]  Encounter Date: 12/02/2016      PT End of Session - 12/02/16 1314    Visit Number 19   Number of Visits 25   Date for PT Re-Evaluation 12/10/16   Authorization Type 4   Authorization Time Period of 10 g code   PT Start Time 6226  pt arived late   PT Stop Time 1345   PT Time Calculation (min) 31 min   Activity Tolerance Patient tolerated treatment well   Behavior During Therapy [facility] for tasks assessed/performed      Past Medical History:  Diagnosis Date  . Arthritis   . Depression     Past Surgical History:  Procedure Laterality Date  . ABDOMINAL HYSTERECTOMY  1995  . back sugery    . BUNIONECTOMY  2013   rt foot  . COLONOSCOPY    . MUSCLE BIOPSY Left 10/03/2012   Procedure: LEFT QUADRICEP MUSCLE BIOPSY;  Surgeon: Odis Hollingshead, MD;  Location: [facility];  Service: General;  Laterality: Left;  . NECK SURGERY  2010   cerv disc fused     There were no vitals filed for this visit.      Subjective Assessment - 12/02/16 1318    Subjective Pt states wearing her R AFO. Knees are burning as usual.  Thinks she is stressed from the rain and driving. R hip and doing stuff in standing is ok.    Pertinent History LE weakness.  Symptoms occured suddenly, unknown method of injury prior to her first neck fusion surgery on January 2010. Had lower back surgery fusion in 2011.  The neck and back surgeries did not help. Pt states having increased urinary urgency and takes medication for for it. MD aware.  Denies saddle anesthesia.  Pt states that her doctor told her that PT is the only thing that is going to help her keep moving so she continues to participate in  PT.  Last round of PT was last year which helped.  Currently has difficulty walking, performing chores (wash dishes, laundry), cooking. Better able to do her tasks a little bit when she does therapy but gets harder when she stops.  Feels burning and stinging in both her knees, and bilateral anterior and lateral legs.  Pt states not having back or neck pain. Just a stiff neck.  Pt states she usually walks with a cane on her R side. No falls within the last 6 months.     Patient Stated Goals Be better able to walk, and get around better without the stinging and burning in her knees and legs.    Currently in Pain? Yes   Pain Score 4   burning sensation, 3-4/10   Pain Onset More than a month ago                                 PT Education - 12/02/16 1329    Education provided Yes   Education Details ther-ex   [employer]) Educated Patient   Methods Explanation;Demonstration;Tactile cues;Verbal cues   Comprehension Returned demonstration;Verbalized understanding        Objectives  Pt wearing her R AFO  There-ex  Blood pressure L arm sitting, mechanically taken: 129/65, HR 66  Obtained secondary to pt stating having a headache  Reclined  SLR R hip flexion with PT assist 5x3  Then R hip flexion in hooklying with knee bent 5x3 with PT assist   Assisted R knee to chest starting with R knee straight 5x3  Seated hip adduction ball squeeze and glute max squeeze 10x3 with 5 second holds  Seated L trunk side bend to stretch R side 10 seconds x 5  Seated trunk flexion stretch 10 seconds x 5  Seated bilateral shoulder extension isometrics with hands at thighs 10x5 seconds to promote trunk muscle strengthening  Seated assisted R hip flexion with PT assist 5x with static holds about 2 seconds throughout range    Improved exercise technique, movement at target joints, use of target muscles after min to mod verbal, visual, tactile cues.   Continued working on R  hip flexion and trunk muscle strengthening to promote ability to flex R hip during swing phase of gait due to weakness. Demonstrates difficulty with use of R iliopsoas muscles.          PT Long Term Goals - 10/29/16 1750      PT LONG TERM GOAL #1   Title Pt will be independent with her HEP to promote LE strength in function.   Time 6   Period Weeks   Status On-going   Target Date 12/10/16     PT LONG TERM GOAL #2   Title Patient will improve bilateral LE strength by at least 1/2 MMT grade to promote ability to ambulate and perform functional tasks.    Time 8   Period Weeks   Status Achieved     PT LONG TERM GOAL #3   Title Patient will improve her LEFS score by at least 9 points as a demonstration of improved function.    Baseline 14/80 (07/29/2016); 18/80 (10/29/2016)   Time 6   Period Weeks   Status On-going   Target Date 12/10/16     PT LONG TERM GOAL #4   Title Pt will report being able to walk/stand with SPC over 35 min to promote mobility.    Baseline Pt states being able to walk for about 30 min (07/29/2016); about 1 hour per pt reports (09/07/2016)   Time 8   Period Weeks   Status Achieved     PT LONG TERM GOAL #5   Title Patient will have a decrease in bilateral LE pain to 5/10 or less at worst to promote ability to perform functional tasks.    Baseline 8/10 at worst (07/29/2016); Minimal burning sensation bilateral knee and legs, no pain level number provided. Pt also states feeling pain in her knees during the weekend. No pain level provided (09/07/2016); 3/10 L leg burning sensation at most for the past 7 days (09/28/2016); bilateral knee burning sensation 8/10 at worst (10/29/2016)   Time 6   Period Weeks   Status On-going   Target Date 12/10/16     PT LONG TERM GOAL #6   Title Pt will improve seated R hip flexion strength to at least 3+/5 to promote ability to ambulate and perform standing tasks.    Baseline 2+/5 seated R hip flexion (09/28/2016), (10/29/2016)   Time 6    Period Weeks   Status On-going               Plan - 12/02/16 1330    Clinical Impression Statement Continued  working on R hip flexion and trunk muscle strengthening to promote ability to flex R hip during swing phase of gait due to weakness. Demonstrates difficulty with use of R iliopsoas muscles.    History and Personal Factors relevant to plan of care: Chronicity of condition, weakness   Clinical Presentation Stable   Clinical Presentation due to: Pt tolerated session without aggravation of symptoms.    Clinical Decision Making Low   Rehab Potential Fair   Clinical Impairments Affecting Rehab Potential Chronicity of condition, posible decreasing motivation (observed)   PT Frequency 2x / week   PT Duration 4 weeks   PT Treatment/Interventions Electrical Stimulation;Aquatic Therapy;Ultrasound;Gait training;Functional mobility training;Therapeutic activities;Therapeutic exercise;Balance training;Neuromuscular re-education;Patient/family education;Manual techniques;Dry needling   PT Next Visit Plan LE strengthening, modalities PRN   Consulted and Agree with Plan of Care Patient      Patient will benefit from skilled therapeutic intervention in order to improve the following deficits and impairments:  Pain, Abnormal gait, Decreased balance, Decreased range of motion, Decreased strength, Difficulty walking  Visit Diagnosis: Muscle weakness (generalized)  Difficulty in walking, not elsewhere classified     Problem List Patient Active Problem List   Diagnosis Date Noted  . Weakness 11/03/2012    [name] PT, DPT   12/02/2016, 5:24 PM  [place] [facility] [address], [place], [address] Phone: [phone]   Fax:  [phone]  Name: [name] MRN: [id number] Date of Birth: June 09, 1945

## 2016-12-07 ENCOUNTER — Ambulatory Visit: Payer: Medicare Other

## 2016-12-07 DIAGNOSIS — M79605 Pain in left leg: Secondary | ICD-10-CM | POA: Diagnosis not present

## 2016-12-07 DIAGNOSIS — M6281 Muscle weakness (generalized): Secondary | ICD-10-CM

## 2016-12-07 DIAGNOSIS — M25561 Pain in right knee: Secondary | ICD-10-CM | POA: Diagnosis not present

## 2016-12-07 DIAGNOSIS — M79604 Pain in right leg: Secondary | ICD-10-CM | POA: Diagnosis not present

## 2016-12-07 DIAGNOSIS — M25562 Pain in left knee: Secondary | ICD-10-CM | POA: Diagnosis not present

## 2016-12-07 DIAGNOSIS — R262 Difficulty in walking, not elsewhere classified: Secondary | ICD-10-CM | POA: Diagnosis not present

## 2016-12-07 NOTE — Therapy (Signed)
[place] [facility] [address], [place], [address] Phone: [phone]   Fax:  [phone]  Physical Therapy Treatment  Patient Details  Name: [name] MRN: [id number] Date of Birth: [birth year] Referring Provider: Shary Decamp, [place]  Encounter Date: 12/07/2016      PT End of Session - 12/07/16 1449    Visit Number 20   Number of Visits 25   Date for PT Re-Evaluation 12/10/16   Authorization Type 5   Authorization Time Period of 10 g code   PT Start Time 1450   PT Stop Time 1533   PT Time Calculation (min) 43 min   Activity Tolerance Patient tolerated treatment well   Behavior During Therapy [facility] for tasks assessed/performed      Past Medical History:  Diagnosis Date  . Arthritis   . Depression     Past Surgical History:  Procedure Laterality Date  . ABDOMINAL HYSTERECTOMY  1995  . back sugery    . BUNIONECTOMY  2013   rt foot  . COLONOSCOPY    . MUSCLE BIOPSY Left 10/03/2012   Procedure: LEFT QUADRICEP MUSCLE BIOPSY;  Surgeon: Odis Hollingshead, MD;  Location: [facility];  Service: General;  Laterality: Left;  . NECK SURGERY  2010   cerv disc fused     There were no vitals filed for this visit.      Subjective Assessment - 12/07/16 1452    Subjective Legs and knees feel like they are burning. 8/10 L leg > R. Does not know if its the whether. The R hip does not hurt. Just does not want to move.    Pertinent History LE weakness.  Symptoms occured suddenly, unknown method of injury prior to her first neck fusion surgery on January 2010. Had lower back surgery fusion in 2011.  The neck and back surgeries did not help. Pt states having increased urinary urgency and takes medication for for it. MD aware.  Denies saddle anesthesia.  Pt states that her doctor told her that PT is the only thing that is going to help her keep moving so she continues to participate in PT.  Last round of PT  was last year which helped.  Currently has difficulty walking, performing chores (wash dishes, laundry), cooking. Better able to do her tasks a little bit when she does therapy but gets harder when she stops.  Feels burning and stinging in both her knees, and bilateral anterior and lateral legs.  Pt states not having back or neck pain. Just a stiff neck.  Pt states she usually walks with a cane on her R side. No falls within the last 6 months.     Patient Stated Goals Be better able to walk, and get around better without the stinging and burning in her knees and legs.    Currently in Pain? Yes   Pain Score 8    Pain Onset More than a month ago                                 PT Education - 12/07/16 1502    Education provided Yes   Education Details ther-ex   [employer]) Educated Patient   Methods Explanation;Demonstration;Tactile cues;Verbal cues   Comprehension Returned demonstration;Verbalized understanding        Objectives  Pt wearing her R AFO  Pt states doing seated bilateral shoulder  extension and low rows at home as well resisting green band 10x3 each    There-ex  Sitting on dyna disc   Manual perturbation from PT while maintaining upright posture 1 min x 3  Seated R hip extension isometrics 10x5 seconds for 3 sets.    Decreased L leg burning sensation   Bilateral shoulder extension resisting green band with R hip flexion 10x   Then with R foot starting off resting on 3 inch step 10x2 to promote end range hip flexion   Decreased L leg burning sensation    Assisted hip flexion with PT assist  5x3 to end range    Hip adductor ball squeeze with glute max and transversus abdominis muscle activation 10x5 seconds for 3 sets   Standing R hip flexion onto 3 inch step with L UE assist 5x  Standing R leg press resisting double blue band 10x with bilateral UE assist   Then with double blue and double green band 5x3      Improved exercise  technique, movement at target joints, use of target muscles after min to mod verbal, visual, tactile cues.   Pt able to activate hip flexor muscles at end range hip flexion with PT assist. Demonstrates some muscle activation but weak. Decreased leg burning sensation following exercises to promote trunk and R glute muscle use in sitting.          PT Long Term Goals - 10/29/16 1750      PT LONG TERM GOAL #1   Title Pt will be independent with her HEP to promote LE strength in function.   Time 6   Period Weeks   Status On-going   Target Date 12/10/16     PT LONG TERM GOAL #2   Title Patient will improve bilateral LE strength by at least 1/2 MMT grade to promote ability to ambulate and perform functional tasks.    Time 8   Period Weeks   Status Achieved     PT LONG TERM GOAL #3   Title Patient will improve her LEFS score by at least 9 points as a demonstration of improved function.    Baseline 14/80 (07/29/2016); 18/80 (10/29/2016)   Time 6   Period Weeks   Status On-going   Target Date 12/10/16     PT LONG TERM GOAL #4   Title Pt will report being able to walk/stand with SPC over 35 min to promote mobility.    Baseline Pt states being able to walk for about 30 min (07/29/2016); about 1 hour per pt reports (09/07/2016)   Time 8   Period Weeks   Status Achieved     PT LONG TERM GOAL #5   Title Patient will have a decrease in bilateral LE pain to 5/10 or less at worst to promote ability to perform functional tasks.    Baseline 8/10 at worst (07/29/2016); Minimal burning sensation bilateral knee and legs, no pain level number provided. Pt also states feeling pain in her knees during the weekend. No pain level provided (09/07/2016); 3/10 L leg burning sensation at most for the past 7 days (09/28/2016); bilateral knee burning sensation 8/10 at worst (10/29/2016)   Time 6   Period Weeks   Status On-going   Target Date 12/10/16     PT LONG TERM GOAL #6   Title Pt will improve seated R hip  flexion strength to at least 3+/5 to promote ability to ambulate and perform standing tasks.    Baseline 2+/5  seated R hip flexion (09/28/2016), (10/29/2016)   Time 6   Period Weeks   Status On-going               Plan - 12/07/16 1502    Clinical Impression Statement Pt able to activate hip flexor muscles at end range hip flexion with PT assist. Demonstrates some muscle activation but weak. Decreased leg burning sensation following exercises to promote trunk and R glute muscle use in sitting.    History and Personal Factors relevant to plan of care: Chronicity of condition, weakness   Clinical Presentation Stable   Clinical Presentation due to: decreased leg burning sensation with exercises.    Clinical Decision Making Low   Rehab Potential Fair   Clinical Impairments Affecting Rehab Potential Chronicity of condition, posible decreasing motivation (observed)   PT Frequency 2x / week   PT Duration 4 weeks   PT Treatment/Interventions Electrical Stimulation;Aquatic Therapy;Ultrasound;Gait training;Functional mobility training;Therapeutic activities;Therapeutic exercise;Balance training;Neuromuscular re-education;Patient/family education;Manual techniques;Dry needling   PT Next Visit Plan LE strengthening, modalities PRN   Consulted and Agree with Plan of Care Patient      Patient will benefit from skilled therapeutic intervention in order to improve the following deficits and impairments:  Pain, Abnormal gait, Decreased balance, Decreased range of motion, Decreased strength, Difficulty walking  Visit Diagnosis: Muscle weakness (generalized)  Difficulty in walking, not elsewhere classified     Problem List Patient Active Problem List   Diagnosis Date Noted  . Weakness 11/03/2012    [name] PT, DPT   12/07/2016, 4:50 PM  [place] [facility] [facility] [address] [place], [address] Phone: [phone]   Fax:   [phone]  Name: [name] MRN: [id number] Date of Birth: 03/31/1945

## 2016-12-07 NOTE — Patient Instructions (Addendum)
Instructed pt to use only minimal assist to promote increased use of R hip flexor musles when performing seated assisted R hip flexion exercise at home. Pt demonstrated and verbalized understanding.

## 2016-12-09 ENCOUNTER — Ambulatory Visit: Payer: Medicare Other

## 2016-12-09 DIAGNOSIS — M25561 Pain in right knee: Secondary | ICD-10-CM | POA: Diagnosis not present

## 2016-12-09 DIAGNOSIS — M79605 Pain in left leg: Secondary | ICD-10-CM | POA: Diagnosis not present

## 2016-12-09 DIAGNOSIS — M79604 Pain in right leg: Secondary | ICD-10-CM | POA: Diagnosis not present

## 2016-12-09 DIAGNOSIS — M25562 Pain in left knee: Secondary | ICD-10-CM

## 2016-12-09 DIAGNOSIS — M6281 Muscle weakness (generalized): Secondary | ICD-10-CM | POA: Diagnosis not present

## 2016-12-09 DIAGNOSIS — R262 Difficulty in walking, not elsewhere classified: Secondary | ICD-10-CM

## 2016-12-09 NOTE — Patient Instructions (Signed)
Gave seated clamshells sitting on her bed (so hips are less than 90 degrees flexion) resisting blue band 10x3 with 5 second holds daily. Pt demonstrated and verbalized understanding.

## 2016-12-09 NOTE — Therapy (Signed)
[facility] [address] [place], [address] Phone: [phone]   Fax:  [phone]  Physical Therapy Treatment  Patient Details  Name: [name] MRN: [id number] Date of Birth: 1945/10/26 Referring Provider: Shary Decamp, [place]  Encounter Date: 12/09/2016      PT End of Session - 12/09/16 1306    Visit Number 21   Number of Visits 37   Date for PT Re-Evaluation 01/21/17   Authorization Type 6   Authorization Time Period of 10 g code   PT Start Time 1306   PT Stop Time 1342   PT Time Calculation (min) 36 min   Activity Tolerance Patient tolerated treatment well   Behavior During Therapy [facility] for tasks assessed/performed      Past Medical History:  Diagnosis Date  . Arthritis   . Depression     Past Surgical History:  Procedure Laterality Date  . ABDOMINAL HYSTERECTOMY  1995  . back sugery    . BUNIONECTOMY  2013   rt foot  . COLONOSCOPY    . MUSCLE BIOPSY Left 10/03/2012   Procedure: LEFT QUADRICEP MUSCLE BIOPSY;  Surgeon: Odis Hollingshead, MD;  Location: [place];  Service: General;  Laterality: Left;  . NECK SURGERY  2010   cerv disc fused     There were no vitals filed for this visit.      Subjective Assessment - 12/09/16 1308    Subjective [place] is fine, no burning, her knees have been giving her a fit today. Has been acting up for the past couple of days.  7-8/10 bilateral knee pain currently.   Walking is ok, about the same, just has the pain in her knees.  Pt states that PT help as a whole.     Pertinent History LE weakness.  Symptoms occured suddenly, unknown method of injury prior to her first neck fusion surgery on January 2010. Had lower back surgery fusion in 2011.  The neck and back surgeries did not help. Pt states having increased urinary urgency and takes medication for for it. MD aware.  Denies saddle anesthesia.  Pt states that her doctor told her  that PT is the only thing that is going to help her keep moving so she continues to participate in PT.  Last round of PT was last year which helped.  Currently has difficulty walking, performing chores (wash dishes, laundry), cooking. Better able to do her tasks a little bit when she does therapy but gets harder when she stops.  Feels burning and stinging in both her knees, and bilateral anterior and lateral legs.  Pt states not having back or neck pain. Just a stiff neck.  Pt states she usually walks with a cane on her R side. No falls within the last 6 months.     Patient Stated Goals Be better able to walk, and get around better without the stinging and burning in her knees and legs.    Currently in Pain? Yes   Pain Score 8   7-8/10 bilateral knee pain   Pain Onset More than a month ago            [facility] PT Assessment - 12/09/16 1313      Observation/Other Assessments   Lower Extremity Functional Scale  31/80     Strength   Right Hip Flexion 2+/5   Left Hip Flexion 4/5  PT Education - 12/09/16 1326    Education provided Yes   Education Details ther-ex, plan of care, HEP   Person(s) Educated Patient   Methods Explanation;Demonstration;Tactile cues;Verbal cues   Comprehension Returned demonstration;Verbalized understanding         Objectives  Pt wearing her R AFO   R knee joint line swelling > L   There-ex  Reviewed plan of care: 2x/week x 6 weeks   Sit <> stand from elevated mat table (25 inch height) 10x2 with emphasis on femoral control   Seated clamshells, hips less than 90 degrees flexion resisting blue band 10x  Then 10x5 seconds for 2 sets to promote glute med muscle strength  Given as part of her HEP 10x3 with 5 seconds on her bed. Pt demonstrated and verbalized understanding.  Sitting on eleveated mat table with transversus abdominis and pelvic floor contraction  PT assisted R hip flexion to end range  with 3 second holds 5x3. Slight L leg burning sensation afterwards  Seated R hip extension isometrics 10x5 seconds for 2 sets. Decreased L leg burning sensation.   Pt states no knee pain in sitting but only slight burning in L leg after exercises.    Improved exercise technique, movement at target joints, use of target muscles after min to mod verbal, visual, tactile cues.    Pt demonstrates maintenance of 2+/5 R hip flexion strength. Able to hold R hip flexion at end range with min to mod PT assist. Pt also demonstrates improved LEFS score suggesting improved function since initial evaluation. Decreased bilateral knee pain after exercises to promote femoral control, and glute muscle use. Pt still demonstrates bilateral LE weakness especially her R hip flexor muscles, bilateral knee and leg pain and burning sensation, and difficulty performing functional tasks such as walking and would benefit from continued skilled physical therapy services to address the aforementioned deficits.            PT Long Term Goals - 12/09/16 1315      PT LONG TERM GOAL #1   Title Pt will be independent with her HEP to promote LE strength in function.   Time 6   Period Weeks   Status On-going   Target Date 01/21/17     PT LONG TERM GOAL #2   Title Patient will improve bilateral LE strength by at least 1/2 MMT grade to promote ability to ambulate and perform functional tasks.    Time 8   Period Weeks   Status Achieved     PT LONG TERM GOAL #3   Title Patient will improve her LEFS score by at least 9 points as a demonstration of improved function.    Baseline 14/80 (07/29/2016); 18/80 (10/29/2016); 31/80 (12/09/2016)   Time 6   Period Weeks   Status Achieved     PT LONG TERM GOAL #4   Title Pt will report being able to walk/stand with SPC over 35 min to promote mobility.    Baseline Pt states being able to walk for about 30 min (07/29/2016); about 1 hour per pt reports (09/07/2016)   Time 8   Period  Weeks   Status Achieved     PT LONG TERM GOAL #5   Title Patient will have a decrease in bilateral LE pain to 5/10 or less at worst to promote ability to perform functional tasks.    Baseline 8/10 at worst (07/29/2016); Minimal burning sensation bilateral knee and legs, no pain level number provided. Pt also states feeling  pain in her knees during the weekend. No pain level provided (09/07/2016); 3/10 L leg burning sensation at most for the past 7 days (09/28/2016); bilateral knee burning sensation 8/10 at worst (10/29/2016); 7-8/10 bilateral knee pain, burning comes and goes (12/09/2016)   Time 6   Period Weeks   Status On-going   Target Date 01/21/17     Additional Long Term Goals   Additional Long Term Goals Yes     PT LONG TERM GOAL #6   Title Pt will improve seated R hip flexion strength to at least 3+/5 to promote ability to ambulate and perform standing tasks.    Baseline 2+/5 seated R hip flexion (09/28/2016), (10/29/2016); (12/09/2016)   Time 6   Period Weeks   Status On-going   Target Date 01/21/17     PT LONG TERM GOAL #7   Title Pt will improve her LEFS score to 40/80 or more as a demonstration of improved function.    Baseline 31/80 (12/09/2016)   Time 6   Period Weeks   Status New   Target Date 01/21/17               Plan - 12/09/16 1326    Clinical Impression Statement Pt demonstrates maintenance of 2+/5 R hip flexion strength. Able to hold R hip flexion at end range with min to mod PT assist. Pt also demonstrates improved LEFS score suggesting improved function since initial evaluation. Decreased bilateral knee pain after exercises to promote femoral control, and glute muscle use. Pt still demonstrates bilateral LE weakness especially her R hip flexor muscles, bilateral knee and leg pain and burning sensation, and difficulty performing functional tasks such as walking and would benefit from continued skilled physical therapy services to address the aforementioned deficits.     History and Personal Factors relevant to plan of care: Chronicity of condition, weakness   Clinical Presentation Stable   Clinical Presentation due to: Good carry over of decreased burning sensation. Pt however demonstrates bilateral knee pain. Still maintains 2+/5 R hip flexion strength   Clinical Decision Making Low   Rehab Potential Fair   Clinical Impairments Affecting Rehab Potential Chronicity of condition, posible decreasing motivation (observed)   PT Frequency 2x / week   PT Duration 6 weeks   PT Treatment/Interventions Electrical Stimulation;Aquatic Therapy;Ultrasound;Gait training;Functional mobility training;Therapeutic activities;Therapeutic exercise;Balance training;Neuromuscular re-education;Patient/family education;Manual techniques;Dry needling   PT Next Visit Plan LE strengthening, modalities PRN   Consulted and Agree with Plan of Care Patient      Patient will benefit from skilled therapeutic intervention in order to improve the following deficits and impairments:  Pain, Abnormal gait, Decreased balance, Decreased range of motion, Decreased strength, Difficulty walking  Visit Diagnosis: Muscle weakness (generalized) - Plan: PT plan of care cert/re-cert  Difficulty in walking, not elsewhere classified - Plan: PT plan of care cert/re-cert  Right knee pain, unspecified chronicity - Plan: PT plan of care cert/re-cert  Left knee pain, unspecified chronicity - Plan: PT plan of care cert/re-cert  Pain in left leg - Plan: PT plan of care cert/re-cert  Pain in right leg - Plan: PT plan of care cert/re-cert     Problem List Patient Active Problem List   Diagnosis Date Noted  . Weakness 11/03/2012    [name] PT, DPT   12/09/2016, 9:31 PM  [facility] [address] [place], [address] Phone: [phone]   Fax:  [phone]  Name: [name] MRN:  [id number] Date of Birth:  [birth year]

## 2016-12-14 ENCOUNTER — Ambulatory Visit: Payer: Medicare Other

## 2016-12-14 DIAGNOSIS — M25562 Pain in left knee: Secondary | ICD-10-CM | POA: Diagnosis not present

## 2016-12-14 DIAGNOSIS — M79605 Pain in left leg: Secondary | ICD-10-CM | POA: Diagnosis not present

## 2016-12-14 DIAGNOSIS — M25561 Pain in right knee: Secondary | ICD-10-CM | POA: Diagnosis not present

## 2016-12-14 DIAGNOSIS — M79604 Pain in right leg: Secondary | ICD-10-CM | POA: Diagnosis not present

## 2016-12-14 DIAGNOSIS — R262 Difficulty in walking, not elsewhere classified: Secondary | ICD-10-CM | POA: Diagnosis not present

## 2016-12-14 DIAGNOSIS — M6281 Muscle weakness (generalized): Secondary | ICD-10-CM | POA: Diagnosis not present

## 2016-12-14 NOTE — Therapy (Signed)
[facility] [address], [place], [address] Phone: [phone]   Fax:  [phone]  Physical Therapy Treatment  Patient Details  Name: [name] MRN: [id number] Date of Birth: October 25, 1945 Referring Provider: Shary Decamp, [place]  Encounter Date: 12/14/2016      PT End of Session - 12/14/16 1308    Visit Number 22   Number of Visits 37   Date for PT Re-Evaluation 01/21/17   Authorization Type 7   Authorization Time Period of 10 g code   PT Start Time 1308  pt arrived late   PT Stop Time 1349   PT Time Calculation (min) 41 min   Activity Tolerance Patient tolerated treatment well   Behavior During Therapy [facility] for tasks assessed/performed      Past Medical History:  Diagnosis Date  . Arthritis   . Depression     Past Surgical History:  Procedure Laterality Date  . ABDOMINAL HYSTERECTOMY  1995  . back sugery    . BUNIONECTOMY  2013   rt foot  . COLONOSCOPY    . MUSCLE BIOPSY Left 10/03/2012   Procedure: LEFT QUADRICEP MUSCLE BIOPSY;  Surgeon: Odis Hollingshead, MD;  Location: [place];  Service: General;  Laterality: Left;  . NECK SURGERY  2010   cerv disc fused     There were no vitals filed for this visit.      Subjective Assessment - 12/14/16 1309    Subjective Legs feel alright, knees still hurting a little.    Pertinent History LE weakness.  Symptoms occured suddenly, unknown method of injury prior to her first neck fusion surgery on January 2010. Had lower back surgery fusion in 2011.  The neck and back surgeries did not help. Pt states having increased urinary urgency and takes medication for for it. MD aware.  Denies saddle anesthesia.  Pt states that her doctor told her that PT is the only thing that is going to help her keep moving so she continues to participate in PT.  Last round of PT was last year which helped.  Currently has difficulty walking, performing  chores (wash dishes, laundry), cooking. Better able to do her tasks a little bit when she does therapy but gets harder when she stops.  Feels burning and stinging in both her knees, and bilateral anterior and lateral legs.  Pt states not having back or neck pain. Just a stiff neck.  Pt states she usually walks with a cane on her R side. No falls within the last 6 months.     Patient Stated Goals Be better able to walk, and get around better without the stinging and burning in her knees and legs.    Currently in Pain? Yes   Pain Score --  bilateral knee pain, no number provided.    Pain Onset More than a month ago                                 PT Education - 12/14/16 1312    Education provided Yes   Education Details ther-ex   [employer]) Educated Patient   Methods Explanation;Demonstration;Tactile cues;Verbal cues   Comprehension Returned demonstration;Verbalized understanding        Objectives  Pt wearing her R AFO   R lateral hamstrings tension palpated in sitting.   Manual therapy  STM R lateral hamstrings in sitting  to promote more neutral R tibia and decrease lateral rotation of R tibia   There-ex  Seated R hip extension isometrics 10x5 seconds for 2 sets.   Sit <> stand from elevated mat table (25 inch height) 5x2, then 10x with emphasis on femoral control   R knee soreness which eases with repetition.   Sitting on eleveated mat table with transversus abdominis and pelvic floor contraction             PT assisted R hip flexion to end range with 3 second holds 5x3.   Standing R LE leg press resisting double blue band 10x3 with bilateral UE assist to promote R glute strength and assisted R hip flexion in standing.   Standing with bilateral UE assist   R toe taps onto treadmill platform 10x    Standing R knee flexion 10x   standing R knee flexion and hip flexion toe tap onto treadmill platform.5x3   R hip abduction 10x3   L hip  abduction 10x2    Improved exercise technique, movement at target joints, use of target muscles after min to mod verbal, visual, tactile cues.    Worked on LE strengthening, R hip flexion, R hamstring flexion, trunk muscle strengthening to promote ability to ambulate. Pt tolerated session well without aggravation of symptoms            PT Long Term Goals - 12/09/16 1315      PT LONG TERM GOAL #1   Title Pt will be independent with her HEP to promote LE strength in function.   Time 6   Period Weeks   Status On-going   Target Date 01/21/17     PT LONG TERM GOAL #2   Title Patient will improve bilateral LE strength by at least 1/2 MMT grade to promote ability to ambulate and perform functional tasks.    Time 8   Period Weeks   Status Achieved     PT LONG TERM GOAL #3   Title Patient will improve her LEFS score by at least 9 points as a demonstration of improved function.    Baseline 14/80 (07/29/2016); 18/80 (10/29/2016); 31/80 (12/09/2016)   Time 6   Period Weeks   Status Achieved     PT LONG TERM GOAL #4   Title Pt will report being able to walk/stand with SPC over 35 min to promote mobility.    Baseline Pt states being able to walk for about 30 min (07/29/2016); about 1 hour per pt reports (09/07/2016)   Time 8   Period Weeks   Status Achieved     PT LONG TERM GOAL #5   Title Patient will have a decrease in bilateral LE pain to 5/10 or less at worst to promote ability to perform functional tasks.    Baseline 8/10 at worst (07/29/2016); Minimal burning sensation bilateral knee and legs, no pain level number provided. Pt also states feeling pain in her knees during the weekend. No pain level provided (09/07/2016); 3/10 L leg burning sensation at most for the past 7 days (09/28/2016); bilateral knee burning sensation 8/10 at worst (10/29/2016); 7-8/10 bilateral knee pain, burning comes and goes (12/09/2016)   Time 6   Period Weeks   Status On-going   Target Date 01/21/17      Additional Long Term Goals   Additional Long Term Goals Yes     PT LONG TERM GOAL #6   Title Pt will improve seated R hip flexion strength to at least  3+/5 to promote ability to ambulate and perform standing tasks.    Baseline 2+/5 seated R hip flexion (09/28/2016), (10/29/2016); (12/09/2016)   Time 6   Period Weeks   Status On-going   Target Date 01/21/17     PT LONG TERM GOAL #7   Title Pt will improve her LEFS score to 40/80 or more as a demonstration of improved function.    Baseline 31/80 (12/09/2016)   Time 6   Period Weeks   Status New   Target Date 01/21/17               Plan - 12/14/16 1313    Clinical Impression Statement Worked on LE strengthening, R hip flexion, R hamstring flexion, trunk muscle strengthening to promote ability to ambulate. Pt tolerated session well without aggravation of symptoms    History and Personal Factors relevant to plan of care: Chronicity of condition, weakness   Clinical Presentation Stable   Clinical Presentation due to: Pt tolerated session without aggravation of symptoms.    Clinical Decision Making Low   Rehab Potential Fair   Clinical Impairments Affecting Rehab Potential Chronicity of condition, posible decreasing motivation (observed)   PT Frequency 2x / week   PT Duration 6 weeks   PT Treatment/Interventions Electrical Stimulation;Aquatic Therapy;Ultrasound;Gait training;Functional mobility training;Therapeutic activities;Therapeutic exercise;Balance training;Neuromuscular re-education;Patient/family education;Manual techniques;Dry needling   PT Next Visit Plan LE strengthening, modalities PRN   Consulted and Agree with Plan of Care Patient      Patient will benefit from skilled therapeutic intervention in order to improve the following deficits and impairments:  Pain, Abnormal gait, Decreased balance, Decreased range of motion, Decreased strength, Difficulty walking  Visit Diagnosis: Muscle weakness (generalized)  Difficulty  in walking, not elsewhere classified  Right knee pain, unspecified chronicity  Left knee pain, unspecified chronicity  Pain in left leg  Pain in right leg     Problem List Patient Active Problem List   Diagnosis Date Noted  . Weakness 11/03/2012   [name] PT, DPT   12/14/2016, 6:00 PM  [facility] [address] [place], [address] Phone: [phone]   Fax:  [phone]  Name: [name] MRN: [id number] Date of Birth: 21-Jan-1946

## 2016-12-16 ENCOUNTER — Ambulatory Visit: Payer: Medicare Other

## 2016-12-16 DIAGNOSIS — M6281 Muscle weakness (generalized): Secondary | ICD-10-CM

## 2016-12-16 DIAGNOSIS — M79604 Pain in right leg: Secondary | ICD-10-CM

## 2016-12-16 DIAGNOSIS — R262 Difficulty in walking, not elsewhere classified: Secondary | ICD-10-CM | POA: Diagnosis not present

## 2016-12-16 DIAGNOSIS — M25562 Pain in left knee: Secondary | ICD-10-CM

## 2016-12-16 DIAGNOSIS — M79605 Pain in left leg: Secondary | ICD-10-CM | POA: Diagnosis not present

## 2016-12-16 DIAGNOSIS — M25561 Pain in right knee: Secondary | ICD-10-CM

## 2016-12-16 NOTE — Therapy (Signed)
[facility] [address] [place], [address] Phone: [phone]   Fax:  [phone]  Physical Therapy Treatment  Patient Details  Name: [name] MRN: [id number] Date of Birth: [birth year] Referring Provider: Shary Decamp, [place]  Encounter Date: 12/16/2016      PT End of Session - 12/16/16 1309    Visit Number 23   Number of Visits 37   Date for PT Re-Evaluation 01/21/17   Authorization Type 8   Authorization Time Period of 10 g code   PT Start Time 3254  pt arrived late   PT Stop Time 1341   PT Time Calculation (min) 30 min   Activity Tolerance Patient tolerated treatment well   Behavior During Therapy [facility] for tasks assessed/performed      Past Medical History:  Diagnosis Date  . Arthritis   . Depression     Past Surgical History:  Procedure Laterality Date  . ABDOMINAL HYSTERECTOMY  1995  . back sugery    . BUNIONECTOMY  2013   rt foot  . COLONOSCOPY    . MUSCLE BIOPSY Left 10/03/2012   Procedure: LEFT QUADRICEP MUSCLE BIOPSY;  Surgeon: Odis Hollingshead, MD;  Location: [place];  Service: General;  Laterality: Left;  . NECK SURGERY  2010   cerv disc fused     There were no vitals filed for this visit.      Subjective Assessment - 12/16/16 1311    Subjective Pt states that she has been trying to get an appointment with her doctor for her knees. 8/10 R knee, 5/10 L knee. Also feels burning. Has also been moving around this morning washing clothes.    Pertinent History LE weakness.  Symptoms occured suddenly, unknown method of injury prior to her first neck fusion surgery on January 2010. Had lower back surgery fusion in 2011.  The neck and back surgeries did not help. Pt states having increased urinary urgency and takes medication for for it. MD aware.  Denies saddle anesthesia.  Pt states that her doctor told her that PT is the only thing that is going to help her keep  moving so she continues to participate in PT.  Last round of PT was last year which helped.  Currently has difficulty walking, performing chores (wash dishes, laundry), cooking. Better able to do her tasks a little bit when she does therapy but gets harder when she stops.  Feels burning and stinging in both her knees, and bilateral anterior and lateral legs.  Pt states not having back or neck pain. Just a stiff neck.  Pt states she usually walks with a cane on her R side. No falls within the last 6 months.     Patient Stated Goals Be better able to walk, and get around better without the stinging and burning in her knees and legs.    Currently in Pain? Yes   Pain Score 8   R knee, 5/10 L knee   Pain Orientation Right;Left   Pain Onset More than a month ago                                 PT Education - 12/16/16 1330    Education provided Yes   Education Details ther-ex   [employer]) Educated Patient   Methods Explanation;Demonstration;Tactile cues;Verbal cues   Comprehension Returned demonstration;Verbalized understanding  Objectives  Pt wearing her R AFO   R lateral hamstrings tension palpated in sitting.   Manual therapy  STM R lateral hamstrings in sitting to promote more neutral R tibia and decrease lateral rotation of R tibia  Decreased R knee pain and leg burning sensation  There-ex  Side step at treadmill bars 3x each   Standing R LE leg press resisting double blue band 10x3 with bilateral UE assist to promote R glute strength and assisted R hip flexion in standing.   Sit <> stand from regular chair with bilateral UE assist multiple times, emphasis on bilateral femoral control   Standing with bilateral UE assist              Standing R knee flexion 10x2 with PT assist to end range    R toe taps onto treadmill platform 10x2 with hip and knee flexion    Improved exercise technique, movement at target joints, use of target muscles  after min to mod verbal, visual, tactile cues.   Decreased R knee and leg burning sensation and pain following STM to decrease lateral hamstring tension and promotoe more neutral knee and tibial position. Continued working on femoral control with sit <> stand to help decrease knee pain, and hip and knee flexion to promote foot clearance with gait.          PT Long Term Goals - 12/09/16 1315      PT LONG TERM GOAL #1   Title Pt will be independent with her HEP to promote LE strength in function.   Time 6   Period Weeks   Status On-going   Target Date 01/21/17     PT LONG TERM GOAL #2   Title Patient will improve bilateral LE strength by at least 1/2 MMT grade to promote ability to ambulate and perform functional tasks.    Time 8   Period Weeks   Status Achieved     PT LONG TERM GOAL #3   Title Patient will improve her LEFS score by at least 9 points as a demonstration of improved function.    Baseline 14/80 (07/29/2016); 18/80 (10/29/2016); 31/80 (12/09/2016)   Time 6   Period Weeks   Status Achieved     PT LONG TERM GOAL #4   Title Pt will report being able to walk/stand with SPC over 35 min to promote mobility.    Baseline Pt states being able to walk for about 30 min (07/29/2016); about 1 hour per pt reports (09/07/2016)   Time 8   Period Weeks   Status Achieved     PT LONG TERM GOAL #5   Title Patient will have a decrease in bilateral LE pain to 5/10 or less at worst to promote ability to perform functional tasks.    Baseline 8/10 at worst (07/29/2016); Minimal burning sensation bilateral knee and legs, no pain level number provided. Pt also states feeling pain in her knees during the weekend. No pain level provided (09/07/2016); 3/10 L leg burning sensation at most for the past 7 days (09/28/2016); bilateral knee burning sensation 8/10 at worst (10/29/2016); 7-8/10 bilateral knee pain, burning comes and goes (12/09/2016)   Time 6   Period Weeks   Status On-going   Target Date 01/21/17      Additional Long Term Goals   Additional Long Term Goals Yes     PT LONG TERM GOAL #6   Title Pt will improve seated R hip flexion strength to at least 3+/5  to promote ability to ambulate and perform standing tasks.    Baseline 2+/5 seated R hip flexion (09/28/2016), (10/29/2016); (12/09/2016)   Time 6   Period Weeks   Status On-going   Target Date 01/21/17     PT LONG TERM GOAL #7   Title Pt will improve her LEFS score to 40/80 or more as a demonstration of improved function.    Baseline 31/80 (12/09/2016)   Time 6   Period Weeks   Status New   Target Date 01/21/17               Plan - 12/16/16 1331    Clinical Impression Statement Decreased R knee and leg burning sensation and pain following STM to decrease lateral hamstring tension and promotoe more neutral knee and tibial position. Continued working on femoral control with sit <> stand to help decrease knee pain, and hip and knee flexion to promote foot clearance with gait.    History and Personal Factors relevant to plan of care: Chronicity of condition, weakness   Clinical Presentation Stable   Clinical Presentation due to: Decreased R knee pain and burning sensation after decreasing R lateral hamstring tension   Clinical Decision Making Low   Rehab Potential Fair   Clinical Impairments Affecting Rehab Potential Chronicity of condition, posible decreasing motivation (observed)   PT Frequency 2x / week   PT Duration 6 weeks   PT Treatment/Interventions Electrical Stimulation;Aquatic Therapy;Ultrasound;Gait training;Functional mobility training;Therapeutic activities;Therapeutic exercise;Balance training;Neuromuscular re-education;Patient/family education;Manual techniques;Dry needling   PT Next Visit Plan LE strengthening, modalities PRN   Consulted and Agree with Plan of Care Patient      Patient will benefit from skilled therapeutic intervention in order to improve the following deficits and impairments:  Pain,  Abnormal gait, Decreased balance, Decreased range of motion, Decreased strength, Difficulty walking  Visit Diagnosis: Muscle weakness (generalized)  Difficulty in walking, not elsewhere classified  Right knee pain, unspecified chronicity  Left knee pain, unspecified chronicity  Pain in left leg  Pain in right leg     Problem List Patient Active Problem List   Diagnosis Date Noted  . Weakness 11/03/2012    [name] PT, DPT   12/16/2016, 7:25 PM  [facility] [address], [place], [address] Phone: [phone]   Fax:  [phone]  Name: [name] MRN: [id number]  Date of Birth: 1946-03-07

## 2016-12-18 DIAGNOSIS — M5136 Other intervertebral disc degeneration, lumbar region: Secondary | ICD-10-CM | POA: Diagnosis not present

## 2016-12-18 DIAGNOSIS — M25561 Pain in right knee: Secondary | ICD-10-CM | POA: Diagnosis not present

## 2016-12-18 DIAGNOSIS — M179 Osteoarthritis of knee, unspecified: Secondary | ICD-10-CM | POA: Diagnosis not present

## 2016-12-18 DIAGNOSIS — K219 Gastro-esophageal reflux disease without esophagitis: Secondary | ICD-10-CM | POA: Diagnosis not present

## 2016-12-18 DIAGNOSIS — M6281 Muscle weakness (generalized): Secondary | ICD-10-CM | POA: Diagnosis not present

## 2016-12-21 ENCOUNTER — Ambulatory Visit: Payer: Medicare Other | Attending: Physician Assistant

## 2016-12-21 DIAGNOSIS — M6281 Muscle weakness (generalized): Secondary | ICD-10-CM

## 2016-12-21 DIAGNOSIS — M25562 Pain in left knee: Secondary | ICD-10-CM | POA: Diagnosis not present

## 2016-12-21 DIAGNOSIS — M79605 Pain in left leg: Secondary | ICD-10-CM | POA: Insufficient documentation

## 2016-12-21 DIAGNOSIS — M79604 Pain in right leg: Secondary | ICD-10-CM | POA: Diagnosis not present

## 2016-12-21 DIAGNOSIS — R262 Difficulty in walking, not elsewhere classified: Secondary | ICD-10-CM | POA: Insufficient documentation

## 2016-12-21 DIAGNOSIS — M25561 Pain in right knee: Secondary | ICD-10-CM | POA: Insufficient documentation

## 2016-12-21 NOTE — Therapy (Signed)
[facility] [address], [place], [address] Phone: [phone]   Fax:  [phone]  Physical Therapy Treatment And Progress Report (G-code)  Patient Details  Name: [name] MRN: [id number] Date of Birth: [birth year] Referring Provider: Shary Decamp, [place]  Encounter Date: 12/21/2016      PT End of Session - 12/21/16 1033    Visit Number 24   Number of Visits 37   Date for PT Re-Evaluation 01/21/17   Authorization Type 1   Authorization Time Period of 10 g code   PT Start Time 1033   PT Stop Time 1116   PT Time Calculation (min) 43 min   Activity Tolerance Patient tolerated treatment well   Behavior During Therapy [facility] for tasks assessed/performed      Past Medical History:  Diagnosis Date  . Arthritis   . Depression     Past Surgical History:  Procedure Laterality Date  . ABDOMINAL HYSTERECTOMY  1995  . back sugery    . BUNIONECTOMY  2013   rt foot  . COLONOSCOPY    . MUSCLE BIOPSY Left 10/03/2012   Procedure: LEFT QUADRICEP MUSCLE BIOPSY;  Surgeon: Odis Hollingshead, MD;  Location: [place];  Service: General;  Laterality: Left;  . NECK SURGERY  2010   cerv disc fused     There were no vitals filed for this visit.      Subjective Assessment - 12/21/16 1036    Subjective Went to her PCP last Friday becuase of R knee swelling. Got a shot at her R hip which helped her R knee. Was also given some prednisone for 2 weeks (but only took one dose of 4 tablets) which helped her R knee.  Was supposed to take 4 tablets for 3 days straight, and take 1 tablet daily until prescription runs out.  Just a little stinging in her L knee. No burning currently.  Has been massaging her L lateral hamstrings which really helps.    Pertinent History LE weakness.  Symptoms occured suddenly, unknown method of injury prior to her first neck fusion surgery on January 2010. Had lower back surgery  fusion in 2011.  The neck and back surgeries did not help. Pt states having increased urinary urgency and takes medication for for it. MD aware.  Denies saddle anesthesia.  Pt states that her doctor told her that PT is the only thing that is going to help her keep moving so she continues to participate in PT.  Last round of PT was last year which helped.  Currently has difficulty walking, performing chores (wash dishes, laundry), cooking. Better able to do her tasks a little bit when she does therapy but gets harder when she stops.  Feels burning and stinging in both her knees, and bilateral anterior and lateral legs.  Pt states not having back or neck pain. Just a stiff neck.  Pt states she usually walks with a cane on her R side. No falls within the last 6 months.     Patient Stated Goals Be better able to walk, and get around better without the stinging and burning in her knees and legs.    Currently in Pain? No/denies   Pain Score 0-No pain   Pain Onset More than a month ago  PT Education - 12/21/16 1053    Education provided Yes   Education Details ther-ex   [employer]) Educated Patient   Methods Explanation;Tactile cues;Verbal cues;Demonstration   Comprehension Verbalized understanding;Returned demonstration         Objectives  Pt wearing her R AFO   Manual therapy  STM R lateral hamstrings in sitting to promote more neutral R tibia and decrease lateral rotation of R tibia             There-ex   Sitting on dyna disc   Seated manual trunk perturbation 1 min x 3   Seated L hip extension isometrics 10x5 seconds for 3 sets. Decreased L knee symptoms.  Assisted R hip flexion with 3 second holds throughout range 5x3  Standing with bilateral UE assist    Standing R LE leg press resisting double blue band 10x3 with bilateral UE assist to promote R glute strength and assisted R hip flexion in standing.     Standing R knee flexion 10x3 with PT assist to end range              R toe taps onto treadmill platform 10x3 with hip and knee flexion    Improved exercise technique, movement at target joints, use of target muscles after min to mod verbal, visual, tactile cues.     Continued working on trunk, hamstrings, and hip strengthening today to promote ability to perform hip and knee flexion to promote better R foot clearance during gait. Pt demonstrates maintenance of 2+/5 R hip flexion strength. Able to hold R hip flexion at end range with min to mod PT assist. Pt also demonstrates improved LEFS score suggesting improved function since initial evaluation. Pt still demonstrates bilateral LE weakness especially her R hip flexor muscles, bilateral knee and leg pain and burning sensation, and difficulty performing functional tasks such as walking and would benefit from continued skilled physical therapy services to address the aforementioned deficits.        PT Long Term Goals - 12/21/16 1242      PT LONG TERM GOAL #1   Title Pt will be independent with her HEP to promote LE strength in function.   Time 6   Period Weeks   Status On-going   Target Date 01/21/17     PT LONG TERM GOAL #2   Title Patient will improve bilateral LE strength by at least 1/2 MMT grade to promote ability to ambulate and perform functional tasks.    Time 8   Period Weeks   Status Achieved     PT LONG TERM GOAL #3   Title Patient will improve her LEFS score by at least 9 points as a demonstration of improved function.    Baseline 14/80 (07/29/2016); 18/80 (10/29/2016); 31/80 (12/09/2016)   Time 6   Period Weeks   Status Achieved     PT LONG TERM GOAL #4   Title Pt will report being able to walk/stand with SPC over 35 min to promote mobility.    Baseline Pt states being able to walk for about 30 min (07/29/2016); about 1 hour per pt reports (09/07/2016)   Time 8   Period Weeks   Status Achieved      PT LONG TERM GOAL #5   Title Patient will have a decrease in bilateral LE pain to 5/10 or less at worst to promote ability to perform functional tasks.    Baseline 8/10 at worst (07/29/2016); Minimal burning sensation bilateral knee and legs, no  pain level number provided. Pt also states feeling pain in her knees during the weekend. No pain level provided (09/07/2016); 3/10 L leg burning sensation at most for the past 7 days (09/28/2016); bilateral knee burning sensation 8/10 at worst (10/29/2016); 7-8/10 bilateral knee pain, burning comes and goes (12/09/2016)   Time 6   Period Weeks   Status On-going   Target Date 01/21/17     PT LONG TERM GOAL #6   Title Pt will improve seated R hip flexion strength to at least 3+/5 to promote ability to ambulate and perform standing tasks.    Baseline 2+/5 seated R hip flexion (09/28/2016), (10/29/2016); (12/09/2016)   Time 6   Period Weeks   Status On-going   Target Date 01/21/17     PT LONG TERM GOAL #7   Title Pt will improve her LEFS score to 40/80 or more as a demonstration of improved function.    Baseline 31/80 (12/09/2016)   Time 6   Period Weeks   Status On-going   Target Date 01/21/17               Plan - 2017-01-03 1032    Clinical Impression Statement Continued working on trunk, hamstrings, and hip strengthening today to promote ability to perform hip and knee flexion to promote better R foot clearance during gait. Pt demonstrates maintenance of 2+/5 R hip flexion strength. Able to hold R hip flexion at end range with min to mod PT assist. Pt also demonstrates improved LEFS score suggesting improved function since initial evaluation. Pt still demonstrates bilateral LE weakness especially her R hip flexor muscles, bilateral knee and leg pain and burning sensation, and difficulty performing functional tasks such as walking and would benefit from continued skilled physical therapy services to address the aforementioned deficits.    History and  Personal Factors relevant to plan of care: Chronicity of condition, weakness   Clinical Presentation Stable   Clinical Presentation due to: Pt tolerated session without aggravation of symptoms.    Clinical Decision Making Low   Rehab Potential Fair   Clinical Impairments Affecting Rehab Potential Chronicity of condition, posible decreasing motivation (observed)   PT Frequency 2x / week   PT Duration 6 weeks   PT Treatment/Interventions Electrical Stimulation;Aquatic Therapy;Ultrasound;Gait training;Functional mobility training;Therapeutic activities;Therapeutic exercise;Balance training;Neuromuscular re-education;Patient/family education;Manual techniques;Dry needling   PT Next Visit Plan LE strengthening, modalities PRN   Consulted and Agree with Plan of Care Patient      Patient will benefit from skilled therapeutic intervention in order to improve the following deficits and impairments:  Pain, Abnormal gait, Decreased balance, Decreased range of motion, Decreased strength, Difficulty walking  Visit Diagnosis: Muscle weakness (generalized)  Difficulty in walking, not elsewhere classified  Left knee pain, unspecified chronicity       G-Codes - 01-03-2017 1244    Functional Assessment Tool Used (Outpatient Only) LEFS, clinical presentation, patient interview   Functional Limitation Mobility: Walking and moving around   Mobility: Walking and Moving Around Current Status ([id number]) At least 60 percent but less than 80 percent impaired, limited or restricted   Mobility: Walking and Moving Around Goal Status [phone]) At least 40 percent but less than 60 percent impaired, limited or restricted      Problem List Patient Active Problem List   Diagnosis Date Noted  . Weakness 11/03/2012   Thank you for your referral.  [name] PT, DPT   01/03/17, 1:42 PM  [facility]  [address], [place], [address] Phone:  [phone]   Fax:  [phone]  Name: [name] MRN: [id number] Date of Birth: 12-15-1945

## 2016-12-22 DIAGNOSIS — R5382 Chronic fatigue, unspecified: Secondary | ICD-10-CM | POA: Diagnosis not present

## 2016-12-22 DIAGNOSIS — M792 Neuralgia and neuritis, unspecified: Secondary | ICD-10-CM | POA: Diagnosis not present

## 2016-12-22 DIAGNOSIS — G959 Disease of spinal cord, unspecified: Secondary | ICD-10-CM | POA: Diagnosis not present

## 2016-12-22 DIAGNOSIS — E041 Nontoxic single thyroid nodule: Secondary | ICD-10-CM | POA: Diagnosis not present

## 2017-01-04 DIAGNOSIS — M2041 Other hammer toe(s) (acquired), right foot: Secondary | ICD-10-CM | POA: Diagnosis not present

## 2017-01-04 DIAGNOSIS — M12271 Villonodular synovitis (pigmented), right ankle and foot: Secondary | ICD-10-CM | POA: Diagnosis not present

## 2017-01-06 ENCOUNTER — Ambulatory Visit: Payer: Medicare Other

## 2017-01-07 ENCOUNTER — Ambulatory Visit: Payer: Medicare Other

## 2017-01-07 DIAGNOSIS — M79604 Pain in right leg: Secondary | ICD-10-CM | POA: Diagnosis not present

## 2017-01-07 DIAGNOSIS — M79605 Pain in left leg: Secondary | ICD-10-CM | POA: Diagnosis not present

## 2017-01-07 DIAGNOSIS — M6281 Muscle weakness (generalized): Secondary | ICD-10-CM | POA: Diagnosis not present

## 2017-01-07 DIAGNOSIS — M25561 Pain in right knee: Secondary | ICD-10-CM | POA: Diagnosis not present

## 2017-01-07 DIAGNOSIS — M25562 Pain in left knee: Secondary | ICD-10-CM | POA: Diagnosis not present

## 2017-01-07 DIAGNOSIS — R262 Difficulty in walking, not elsewhere classified: Secondary | ICD-10-CM

## 2017-01-07 NOTE — Therapy (Signed)
[facility] [address] [place], [address] Phone: [phone]   Fax:  [phone]  Physical Therapy Treatment  Patient Details  Name: [name] MRN: [id number] Date of Birth: Jul 24, 1945 Referring Provider: Shary Decamp, [place]  Encounter Date: 01/07/2017      PT End of Session - 01/07/17 1439    Visit Number 25   Number of Visits 37   Date for PT Re-Evaluation 01/21/17   Authorization Type 2   Authorization Time Period of 10 g code   PT Start Time 1941  pt arrived late   PT Stop Time 1524   PT Time Calculation (min) 45 min   Activity Tolerance Patient tolerated treatment well   Behavior During Therapy [facility] for tasks assessed/performed      Past Medical History:  Diagnosis Date  . Arthritis   . Depression     Past Surgical History:  Procedure Laterality Date  . ABDOMINAL HYSTERECTOMY  1995  . back sugery    . BUNIONECTOMY  2013   rt foot  . COLONOSCOPY    . MUSCLE BIOPSY Left 10/03/2012   Procedure: LEFT QUADRICEP MUSCLE BIOPSY;  Surgeon: Odis Hollingshead, MD;  Location: [facility];  Service: General;  Laterality: Left;  . NECK SURGERY  2010   cerv disc fused     There were no vitals filed for this visit.      Subjective Assessment - 01/07/17 1440    Subjective Legs and hip is feeling pretty good. Just a slight tingling L leg (proximal tibialis anterior). Pt states better able to perform R hip flexion overall.    Pertinent History LE weakness.  Symptoms occured suddenly, unknown method of injury prior to her first neck fusion surgery on January 2010. Had lower back surgery fusion in 2011.  The neck and back surgeries did not help. Pt states having increased urinary urgency and takes medication for for it. MD aware.  Denies saddle anesthesia.  Pt states that her doctor told her that PT is the only thing that is going to help her keep moving so she continues to participate  in PT.  Last round of PT was last year which helped.  Currently has difficulty walking, performing chores (wash dishes, laundry), cooking. Better able to do her tasks a little bit when she does therapy but gets harder when she stops.  Feels burning and stinging in both her knees, and bilateral anterior and lateral legs.  Pt states not having back or neck pain. Just a stiff neck.  Pt states she usually walks with a cane on her R side. No falls within the last 6 months.     Patient Stated Goals Be better able to walk, and get around better without the stinging and burning in her knees and legs.    Currently in Pain? Other (Comment)   Pain Score --  no complain of pain   Pain Onset More than a month ago                                 PT Education - 01/07/17 1502    Education provided Yes   Education Details ther-ex, HEP   Person(s) Educated Patient   Methods Explanation;Demonstration;Tactile cues;Verbal cues   Comprehension Returned demonstration;Verbalized understanding        Objectives  Pt wearing her R AFO  Pt states  better able to perform R hip flexion overall.    Manual therapy  STM R and L lateral hamstrings in sitting to promote more neutral  tibia and decrease lateral rotation of  tibia  No L leg symptoms afterwards   There-ex   Sitting on dyna disc  Trunk flexion isometrics with PT manual resistance 10x5 seconds for 3 sets             Seated manual trunk perturbation 1 min x 3              Seated L hip extension isometrics with PT assisted R hip flexion with 3 second holds throughout range 5x3   Standing R LE leg press resisting double blue band 10x3 with bilateral UE assist to promote R glute strength and assisted R hip flexion in standing.   Gait 5 ft with one UE assist 5x with emphasis on R hip flexion. Improved swing phase. No LOB.    Improved exercise technique, movement at target joints, use of target muscles after  min to mod verbal, visual, tactile cues.   Improving ability to perform R hip flexion in sitting observed. Worked on R hip flexion during gait to promote foot clearance with less circumduction. Pt was recommended to practice it at home with someone but to take breaks if she feels tired for safety so that she does not trip on her R foot. Pt demonstrated and verbalized understanding. No L leg tingling after manual therapy.           PT Long Term Goals - 12/21/16 1242      PT LONG TERM GOAL #1   Title Pt will be independent with her HEP to promote LE strength in function.   Time 6   Period Weeks   Status On-going   Target Date 01/21/17     PT LONG TERM GOAL #2   Title Patient will improve bilateral LE strength by at least 1/2 MMT grade to promote ability to ambulate and perform functional tasks.    Time 8   Period Weeks   Status Achieved     PT LONG TERM GOAL #3   Title Patient will improve her LEFS score by at least 9 points as a demonstration of improved function.    Baseline 14/80 (07/29/2016); 18/80 (10/29/2016); 31/80 (12/09/2016)   Time 6   Period Weeks   Status Achieved     PT LONG TERM GOAL #4   Title Pt will report being able to walk/stand with SPC over 35 min to promote mobility.    Baseline Pt states being able to walk for about 30 min (07/29/2016); about 1 hour per pt reports (09/07/2016)   Time 8   Period Weeks   Status Achieved     PT LONG TERM GOAL #5   Title Patient will have a decrease in bilateral LE pain to 5/10 or less at worst to promote ability to perform functional tasks.    Baseline 8/10 at worst (07/29/2016); Minimal burning sensation bilateral knee and legs, no pain level number provided. Pt also states feeling pain in her knees during the weekend. No pain level provided (09/07/2016); 3/10 L leg burning sensation at most for the past 7 days (09/28/2016); bilateral knee burning sensation 8/10 at worst (10/29/2016); 7-8/10 bilateral knee pain, burning comes and goes  (12/09/2016)   Time 6   Period Weeks   Status On-going   Target Date 01/21/17     PT LONG TERM GOAL #6  Title Pt will improve seated R hip flexion strength to at least 3+/5 to promote ability to ambulate and perform standing tasks.    Baseline 2+/5 seated R hip flexion (09/28/2016), (10/29/2016); (12/09/2016)   Time 6   Period Weeks   Status On-going   Target Date 01/21/17     PT LONG TERM GOAL #7   Title Pt will improve her LEFS score to 40/80 or more as a demonstration of improved function.    Baseline 31/80 (12/09/2016)   Time 6   Period Weeks   Status On-going   Target Date 01/21/17               Plan - 01/07/17 1503    Clinical Impression Statement Improving ability to perform R hip flexion in sitting observed. Worked on R hip flexion during gait to promote foot clearance with less circumduction. Pt was recommended to practice it at home with someone but to take breaks if she feels tired for safety so that she does not trip on her R foot. Pt demonstrated and verbalized understanding. No L leg tingling after manual therapy.   History and Personal Factors relevant to plan of care: Chronicity of condition, weakenss   Clinical Presentation Stable   Clinical Presentation due to: Decreased L leg tingling. Improving ability to perform R hip flexion observed.    Clinical Decision Making Low   Rehab Potential Fair   Clinical Impairments Affecting Rehab Potential Chronicity of condition, posible decreasing motivation (observed)   PT Frequency 2x / week   PT Duration 6 weeks   PT Treatment/Interventions Electrical Stimulation;Aquatic Therapy;Ultrasound;Gait training;Functional mobility training;Therapeutic activities;Therapeutic exercise;Balance training;Neuromuscular re-education;Patient/family education;Manual techniques;Dry needling   PT Next Visit Plan LE strengthening, modalities PRN   Consulted and Agree with Plan of Care Patient      Patient will benefit from skilled  therapeutic intervention in order to improve the following deficits and impairments:  Pain, Abnormal gait, Decreased balance, Decreased range of motion, Decreased strength, Difficulty walking  Visit Diagnosis: Muscle weakness (generalized)  Difficulty in walking, not elsewhere classified  Pain in left leg     Problem List Patient Active Problem List   Diagnosis Date Noted  . Weakness 11/03/2012   [name] PT, DPT   01/07/2017, 7:19 PM  [facility] [address] [place], [address] Phone: [phone]   Fax:  [phone]  Name: [name] MRN: [id number] Date of Birth: [birth year]

## 2017-01-07 NOTE — Patient Instructions (Signed)
Pt was recommended to practice walking with increasing R hip flexion to promote swing phase using her SPC on L side at home with someone but to take breaks if she feels tired for safety so that she does not trip on her R foot. Pt demonstrated and verbalized understanding.

## 2017-01-12 ENCOUNTER — Ambulatory Visit: Payer: Medicare Other

## 2017-01-12 DIAGNOSIS — M6281 Muscle weakness (generalized): Secondary | ICD-10-CM | POA: Diagnosis not present

## 2017-01-12 DIAGNOSIS — M25562 Pain in left knee: Secondary | ICD-10-CM | POA: Diagnosis not present

## 2017-01-12 DIAGNOSIS — M25561 Pain in right knee: Secondary | ICD-10-CM | POA: Diagnosis not present

## 2017-01-12 DIAGNOSIS — R262 Difficulty in walking, not elsewhere classified: Secondary | ICD-10-CM

## 2017-01-12 DIAGNOSIS — M79605 Pain in left leg: Secondary | ICD-10-CM | POA: Diagnosis not present

## 2017-01-12 DIAGNOSIS — M79604 Pain in right leg: Secondary | ICD-10-CM | POA: Diagnosis not present

## 2017-01-12 NOTE — Patient Instructions (Signed)
Recommended for pt to watch femoral control when standin up from a chair. Also MH to bilateral lateral hamstrings 15 min at a time 4x daily. Pt demonstrated and verbalized understanding.

## 2017-01-12 NOTE — Therapy (Signed)
[facility] [address] [place], [address] Phone: [phone]   Fax:  [phone]  Physical Therapy Treatment  Patient Details  Name: [name] MRN: [id number] Date of Birth: [birth year] Referring Provider: Shary Decamp, [place]  Encounter Date: 01/12/2017      PT End of Session - 01/12/17 1404    Visit Number 26   Number of Visits 37   Date for PT Re-Evaluation 01/21/17   Authorization Type 3   Authorization Time Period of 10 g code   PT Start Time 2878  pt arrived late   PT Stop Time 1417   PT Time Calculation (min) 13 min   Activity Tolerance Patient tolerated treatment well   Behavior During Therapy [facility] for tasks assessed/performed      Past Medical History:  Diagnosis Date  . Arthritis   . Depression     Past Surgical History:  Procedure Laterality Date  . ABDOMINAL HYSTERECTOMY  1995  . back sugery    . BUNIONECTOMY  2013   rt foot  . COLONOSCOPY    . MUSCLE BIOPSY Left 10/03/2012   Procedure: LEFT QUADRICEP MUSCLE BIOPSY;  Surgeon: Odis Hollingshead, MD;  Location: [facility];  Service: General;  Laterality: Left;  . NECK SURGERY  2010   cerv disc fused     There were no vitals filed for this visit.      Subjective Assessment - 01/12/17 1833    Subjective Pt states both knees are bothering her a lot today. Feels pain inside the joint.    Pertinent History LE weakness.  Symptoms occured suddenly, unknown method of injury prior to her first neck fusion surgery on January 2010. Had lower back surgery fusion in 2011.  The neck and back surgeries did not help. Pt states having increased urinary urgency and takes medication for for it. MD aware.  Denies saddle anesthesia.  Pt states that her doctor told her that PT is the only thing that is going to help her keep moving so she continues to participate in PT.  Last round of PT was last year which helped.  Currently has  difficulty walking, performing chores (wash dishes, laundry), cooking. Better able to do her tasks a little bit when she does therapy but gets harder when she stops.  Feels burning and stinging in both her knees, and bilateral anterior and lateral legs.  Pt states not having back or neck pain. Just a stiff neck.  Pt states she usually walks with a cane on her R side. No falls within the last 6 months.     Patient Stated Goals Be better able to walk, and get around better without the stinging and burning in her knees and legs.    Currently in Pain? Yes   Pain Score --  No pain level provided.    Pain Onset More than a month ago                                 PT Education - 01/12/17 1838    Education provided Yes   Education Details femoral control with sit <> stand   Person(s) Educated Patient   Methods Explanation;Demonstration;Tactile cues;Verbal cues   Comprehension Returned demonstration;Verbalized understanding        Objectives  Pt wearing her R AFO  Pt states bilateral knee pain  Manual therapy  STM R and L lateral hamstrings in sitting to promote more neutral  tibia and decrease lateral rotation of  tibia            Pt states knees feel better afterwards.   Recommended for pt to watch femoral control when standing up from a chair. Also MH to bilateral lateral hamstrings 15 min at a time 4x daily. Pt demonstrated and verbalized understanding.    Decreased bilateral knee pain following STM to decrease lateral hamstrings tension and promoting femoral control with sit <> stand.        PT Long Term Goals - 12/21/16 1242      PT LONG TERM GOAL #1   Title Pt will be independent with her HEP to promote LE strength in function.   Time 6   Period Weeks   Status On-going   Target Date 01/21/17     PT LONG TERM GOAL #2   Title Patient will improve bilateral LE strength by at least 1/2 MMT grade to promote ability to ambulate and  perform functional tasks.    Time 8   Period Weeks   Status Achieved     PT LONG TERM GOAL #3   Title Patient will improve her LEFS score by at least 9 points as a demonstration of improved function.    Baseline 14/80 (07/29/2016); 18/80 (10/29/2016); 31/80 (12/09/2016)   Time 6   Period Weeks   Status Achieved     PT LONG TERM GOAL #4   Title Pt will report being able to walk/stand with SPC over 35 min to promote mobility.    Baseline Pt states being able to walk for about 30 min (07/29/2016); about 1 hour per pt reports (09/07/2016)   Time 8   Period Weeks   Status Achieved     PT LONG TERM GOAL #5   Title Patient will have a decrease in bilateral LE pain to 5/10 or less at worst to promote ability to perform functional tasks.    Baseline 8/10 at worst (07/29/2016); Minimal burning sensation bilateral knee and legs, no pain level number provided. Pt also states feeling pain in her knees during the weekend. No pain level provided (09/07/2016); 3/10 L leg burning sensation at most for the past 7 days (09/28/2016); bilateral knee burning sensation 8/10 at worst (10/29/2016); 7-8/10 bilateral knee pain, burning comes and goes (12/09/2016)   Time 6   Period Weeks   Status On-going   Target Date 01/21/17     PT LONG TERM GOAL #6   Title Pt will improve seated R hip flexion strength to at least 3+/5 to promote ability to ambulate and perform standing tasks.    Baseline 2+/5 seated R hip flexion (09/28/2016), (10/29/2016); (12/09/2016)   Time 6   Period Weeks   Status On-going   Target Date 01/21/17     PT LONG TERM GOAL #7   Title Pt will improve her LEFS score to 40/80 or more as a demonstration of improved function.    Baseline 31/80 (12/09/2016)   Time 6   Period Weeks   Status On-going   Target Date 01/21/17               Plan - 01/12/17 1845    Clinical Impression Statement Decreased bilateral knee pain following STM to decrease lateral hamstrings tension and promoting femoral control  with sit <> stand.    History and Personal Factors relevant to plan of care: Chronicity of  condition, weakenss   Clinical Presentation Stable   Clinical Presentation due to: decreased bilateral knee pain after manual therapy   Clinical Decision Making Low   Rehab Potential Fair   Clinical Impairments Affecting Rehab Potential Chronicity of condition, posible decreasing motivation (observed)   PT Frequency 2x / week   PT Duration 6 weeks   PT Treatment/Interventions Electrical Stimulation;Aquatic Therapy;Ultrasound;Gait training;Functional mobility training;Therapeutic activities;Therapeutic exercise;Balance training;Neuromuscular re-education;Patient/family education;Manual techniques;Dry needling   PT Next Visit Plan LE strengthening, modalities PRN   Consulted and Agree with Plan of Care Patient      Patient will benefit from skilled therapeutic intervention in order to improve the following deficits and impairments:  Pain, Abnormal gait, Decreased balance, Decreased range of motion, Decreased strength, Difficulty walking  Visit Diagnosis: Right knee pain, unspecified chronicity  Muscle weakness (generalized)  Difficulty in walking, not elsewhere classified  Left knee pain, unspecified chronicity     Problem List Patient Active Problem List   Diagnosis Date Noted  . Weakness 11/03/2012    [name] PT, DPT   01/12/2017, 7:02 PM  [facility] [address] [place], [address] Phone: [phone]   Fax:  [phone]  Name: [name] MRN: [id number] Date of Birth: June 08, 1945

## 2017-01-14 ENCOUNTER — Ambulatory Visit: Payer: Medicare Other

## 2017-01-14 DIAGNOSIS — M79604 Pain in right leg: Secondary | ICD-10-CM | POA: Diagnosis not present

## 2017-01-14 DIAGNOSIS — M79605 Pain in left leg: Secondary | ICD-10-CM

## 2017-01-14 DIAGNOSIS — M5136 Other intervertebral disc degeneration, lumbar region: Secondary | ICD-10-CM | POA: Diagnosis not present

## 2017-01-14 DIAGNOSIS — G959 Disease of spinal cord, unspecified: Secondary | ICD-10-CM | POA: Diagnosis not present

## 2017-01-14 DIAGNOSIS — R262 Difficulty in walking, not elsewhere classified: Secondary | ICD-10-CM | POA: Diagnosis not present

## 2017-01-14 DIAGNOSIS — M6281 Muscle weakness (generalized): Secondary | ICD-10-CM

## 2017-01-14 DIAGNOSIS — M25561 Pain in right knee: Secondary | ICD-10-CM

## 2017-01-14 DIAGNOSIS — M25562 Pain in left knee: Secondary | ICD-10-CM | POA: Diagnosis not present

## 2017-01-14 NOTE — Therapy (Signed)
[facility] [address], [place], [address] Phone: [phone]   Fax:  [phone]  Physical Therapy Treatment  Patient Details  Name: [name] MRN: [id number] Date of Birth: March 05, 1946 Referring Provider: Shary Decamp, [place]  Encounter Date: 01/14/2017      PT End of Session - 01/14/17 1122    Visit Number 27   Number of Visits 53   Date for PT Re-Evaluation 03/11/17   Authorization Type 4   Authorization Time Period of 10 g code   PT Start Time 1122   PT Stop Time 1217   PT Time Calculation (min) 55 min   Activity Tolerance Patient tolerated treatment well   Behavior During Therapy [facility] for tasks assessed/performed      Past Medical History:  Diagnosis Date  . Arthritis   . Depression     Past Surgical History:  Procedure Laterality Date  . ABDOMINAL HYSTERECTOMY  1995  . back sugery    . BUNIONECTOMY  2013   rt foot  . COLONOSCOPY    . MUSCLE BIOPSY Left 10/03/2012   Procedure: LEFT QUADRICEP MUSCLE BIOPSY;  Surgeon: Odis Hollingshead, MD;  Location: [facility];  Service: General;  Laterality: Left;  . NECK SURGERY  2010   cerv disc fused     There were no vitals filed for this visit.      Subjective Assessment - 01/14/17 1124    Subjective 8/10 R knee pain (7-8/10 at most for the past 7 days), 5/10 L knee (5/10 at most for the past 7 days) pain currently. Thinking of getting an injection today at 3 pm with Dr. Nigel Mormon.  Leaving for her trip to [place] November 8 and returning Feb 08, 2017. Trying to get a script for a scooter from MD for when she goes to [place].    Pertinent History LE weakness.  Symptoms occured suddenly, unknown method of injury prior to her first neck fusion surgery on January 2010. Had lower back surgery fusion in 2011.  The neck and back surgeries did not help. Pt states having increased urinary urgency and takes medication for for it. MD  aware.  Denies saddle anesthesia.  Pt states that her doctor told her that PT is the only thing that is going to help her keep moving so she continues to participate in PT.  Last round of PT was last year which helped.  Currently has difficulty walking, performing chores (wash dishes, laundry), cooking. Better able to do her tasks a little bit when she does therapy but gets harder when she stops.  Feels burning and stinging in both her knees, and bilateral anterior and lateral legs.  Pt states not having back or neck pain. Just a stiff neck.  Pt states she usually walks with a cane on her R side. No falls within the last 6 months.     Patient Stated Goals Be better able to walk, and get around better without the stinging and burning in her knees and legs.    Currently in Pain? Yes   Pain Score 8    Pain Onset More than a month ago            [facility] PT Assessment - 01/14/17 1147      Observation/Other Assessments   Lower Extremity Functional Scale  18/80  Pt however had increased bilateral knee pain recently     Strength   Right Hip Flexion  2+/5   Left Hip Flexion 4/5                             PT Education - 01/14/17 1157    Education provided Yes   Education Details ther-ex, plan of care   Person(s) Educated Patient   Methods Explanation;Demonstration;Tactile cues;Verbal cues   Comprehension Returned demonstration;Verbalized understanding         Objectives  Pt wearing her R AFO  R knee joint line pain with R lateral knee compartment area swelling  L anterior knee burning sensation  Leaving for her trip to [place] November 8 and returning Feb 08, 2017. Trying to get a script for a scooter from MD for when she goes to [place].     Manual therapy  STM R and L lateral hamstrings in sitting to promote more neutral  tibia              STM R and L vastus lateralis to decrease lateral tension to patella  4/10 bilateral knees  afterwards      There-ex  Reviewed plan of care: 2x/week for 8 weeks (pt out of town for about 2 weeks)   Seated manually resisted L hip flexion, R hip flexion AROM  Sitting on dyna disc             Trunk flexion isometrics with PT manual resistance 10x5 seconds for 3 sets Seated manual trunk perturbation 1 min x 3  Seated L hip extension isometrics with PT assisted R hip flexion with 3 second holds throughout range 5x3 .  Sit <> stand from 24 inch high table 9x with emphasis on femoral control and more even weight distribution. Pt tendency to shift her weight to the R LE as she stands up and sits down, increasing valgus. R knee felt better after more even weight distribution and femoral control.    Improved exercise technique, movement at target joints, use of target muscles after mod verbal, visual, tactile cues.     Pt demonstrates overall improved R hip flexion strength since initial evaluation, but still demonstrates difficulty using her hip flexor muscles secondary to weakness. Improved bilateral knee pain following manual therapy to decrease lateral hamstrings and vastus lateralis muscle tension to promote more neutral positioning of bilateral tibia and patellae. Continued working on femoral control and more even weight distribution bilateral LE to decrease R genu valgus. Decreased bilateral knee pain after session. Pt still demonstrates bilateral knee pain, weakness, difficulty with trunk, pelvic and bilateral femoral control, and difficulty performing functional tasks and would benefit from continued skilled physical therapy services to address the aforementioned deficits. Difficulty with progress secondary to chronicity of condition, and weakness.           PT Long Term Goals - 01/14/17 1241      PT LONG TERM GOAL #1   Title Pt will be independent with her HEP to promote LE strength in function.   Time 8   Period Weeks   Status On-going    Target Date 03/11/17     PT LONG TERM GOAL #2   Title Patient will improve bilateral LE strength by at least 1/2 MMT grade to promote ability to ambulate and perform functional tasks.    Time 8   Period Weeks   Status Achieved     PT LONG TERM GOAL #3   Title Patient will improve her LEFS score by at least 9 points  as a demonstration of improved function.    Baseline 14/80 (07/29/2016); 18/80 (10/29/2016); 31/80 (12/09/2016); 18/80, pt however states increased bilateral knee pain recently (01/14/2017)   Time 8   Period Weeks   Status On-going   Target Date 03/11/17     PT LONG TERM GOAL #4   Title Pt will report being able to walk/stand with SPC over 35 min to promote mobility.    Baseline Pt states being able to walk for about 30 min (07/29/2016); about 1 hour per pt reports (09/07/2016)   Time 8   Period Weeks   Status Achieved     PT LONG TERM GOAL #5   Title Patient will have a decrease in bilateral LE pain to 5/10 or less at worst to promote ability to perform functional tasks.    Baseline 8/10 at worst (07/29/2016); Minimal burning sensation bilateral knee and legs, no pain level number provided. Pt also states feeling pain in her knees during the weekend. No pain level provided (09/07/2016); 3/10 L leg burning sensation at most for the past 7 days (09/28/2016); bilateral knee burning sensation 8/10 at worst (10/29/2016); 7-8/10 bilateral knee pain, burning comes and goes (12/09/2016); 7-8/10 R knee, 5/10 L knee pain at most for the past 7 days (01/14/2017)   Time 6   Period Weeks   Status Partially Met   Target Date 03/11/17     PT LONG TERM GOAL #6   Title Pt will improve seated R hip flexion strength to at least 3+/5 to promote ability to ambulate and perform standing tasks.    Baseline 2+/5 seated R hip flexion (09/28/2016), (10/29/2016); (12/09/2016), (01/14/2017)   Time 8   Period Weeks   Status On-going   Target Date 03/11/17     PT LONG TERM GOAL #7   Title Pt will improve her LEFS  score to 40/80 or more as a demonstration of improved function.    Baseline 31/80 (12/09/2016); 18/80, pt however states increased bilateral knee pain recently (01/14/2017)   Time 8   Period Weeks   Status On-going   Target Date 03/11/17               Plan - 01/14/17 1159    Clinical Impression Statement Pt demonstrates overall improved R hip flexion strength since initial evaluation, but still demonstrates difficulty using her hip flexor muscles secondary to weakness. Improved bilateral knee pain following manual therapy to decrease lateral hamstrings and vastus lateralis muscle tension to promote more neutral positioning of bilateral tibia and patellae. Continued working on femoral control and more even weight distribution bilateral LE to decrease R genu valgus. Decreased bilateral knee pain after session. Pt still demonstrates bilateral knee pain, weakness, difficulty with trunk, pelvic and bilateral femoral control, and difficulty performing functional tasks and would benefit from continued skilled physical therapy services to address the aforementioned deficits. Difficulty with progress secondary to chronicity of condition, and weakness.    History and Personal Factors relevant to plan of care: Chronicity of condition, weakenss   Clinical Presentation Stable   Clinical Presentation due to: decreased bilateral knee pain after manual therapy and exercises to promote femoral control    Clinical Decision Making Low   Rehab Potential Fair   Clinical Impairments Affecting Rehab Potential Chronicity of condition, posible decreasing motivation (observed)   PT Frequency 2x / week   PT Duration 8 weeks   PT Treatment/Interventions Electrical Stimulation;Aquatic Therapy;Ultrasound;Gait training;Functional mobility training;Therapeutic activities;Therapeutic exercise;Balance training;Neuromuscular re-education;Patient/family education;Manual techniques;Dry needling  PT Next Visit Plan LE  strengthening, modalities PRN   Consulted and Agree with Plan of Care Patient      Patient will benefit from skilled therapeutic intervention in order to improve the following deficits and impairments:  Pain, Abnormal gait, Decreased balance, Decreased range of motion, Decreased strength, Difficulty walking  Visit Diagnosis: Muscle weakness (generalized) - Plan: PT plan of care cert/re-cert  Difficulty in walking, not elsewhere classified - Plan: PT plan of care cert/re-cert  Left knee pain, unspecified chronicity - Plan: PT plan of care cert/re-cert  Right knee pain, unspecified chronicity - Plan: PT plan of care cert/re-cert  Pain in left leg - Plan: PT plan of care cert/re-cert  Pain in right leg - Plan: PT plan of care cert/re-cert     Problem List Patient Active Problem List   Diagnosis Date Noted  . Weakness 11/03/2012   [name] PT, DPT   01/14/2017, 12:51 PM  [facility] [address], [place], [address] Phone: [phone]   Fax:  [phone]  Name: [name] MRN: [id number] Date of Birth: 12-07-1945

## 2017-01-19 ENCOUNTER — Ambulatory Visit: Payer: Medicare Other

## 2017-01-19 DIAGNOSIS — M25562 Pain in left knee: Secondary | ICD-10-CM

## 2017-01-19 DIAGNOSIS — M25561 Pain in right knee: Secondary | ICD-10-CM | POA: Diagnosis not present

## 2017-01-19 DIAGNOSIS — R262 Difficulty in walking, not elsewhere classified: Secondary | ICD-10-CM

## 2017-01-19 DIAGNOSIS — M79605 Pain in left leg: Secondary | ICD-10-CM | POA: Diagnosis not present

## 2017-01-19 DIAGNOSIS — M6281 Muscle weakness (generalized): Secondary | ICD-10-CM

## 2017-01-19 DIAGNOSIS — M79604 Pain in right leg: Secondary | ICD-10-CM | POA: Diagnosis not present

## 2017-01-19 NOTE — Therapy (Signed)
[facility] [address], [place], [address] Phone: [phone]   Fax:  [phone]  Physical Therapy Treatment  Patient Details  Name: [name] MRN: [id number] Date of Birth: 02-13-1946 Referring Provider: Shary Decamp, [place]  Encounter Date: 01/19/2017      PT End of Session - 01/19/17 1535    Visit Number 28   Number of Visits 53   Date for PT Re-Evaluation 03/11/17   Authorization Type 5   Authorization Time Period of 10 g code   PT Start Time 1534   PT Stop Time 2500   PT Time Calculation (min) 47 min   Activity Tolerance Patient tolerated treatment well   Behavior During Therapy [facility] for tasks assessed/performed      Past Medical History:  Diagnosis Date  . Arthritis   . Depression     Past Surgical History:  Procedure Laterality Date  . ABDOMINAL HYSTERECTOMY  1995  . back sugery    . BUNIONECTOMY  2013   rt foot  . COLONOSCOPY    . MUSCLE BIOPSY Left 10/03/2012   Procedure: LEFT QUADRICEP MUSCLE BIOPSY;  Surgeon: Odis Hollingshead, MD;  Location: [facility];  Service: General;  Laterality: Left;  . NECK SURGERY  2010   cerv disc fused     There were no vitals filed for this visit.      Subjective Assessment - 01/19/17 1537    Subjective Pt states that she walked a lot earlier and legs felt tired. Also notices difficulty with her legs when she is irritable.  Going to [place] this weekend. Then leaving for [place] January 30, 2017.  Knees feel like they burn and feel week. The doctor gave a prescription for her scooter but more paper work. Was able to rent a scooter for her [place] trip.  Knees don't hurt currently but feels week. L leg (tibialis anterior) feels like its burning.  Knees felt fine since last session. Started bothering her again yesterday when looking for a refrigerator... which got on her nerves.      Pertinent History LE weakness.  Symptoms occured  suddenly, unknown method of injury prior to her first neck fusion surgery on January 2010. Had lower back surgery fusion in 2011.  The neck and back surgeries did not help. Pt states having increased urinary urgency and takes medication for for it. MD aware.  Denies saddle anesthesia.  Pt states that her doctor told her that PT is the only thing that is going to help her keep moving so she continues to participate in PT.  Last round of PT was last year which helped.  Currently has difficulty walking, performing chores (wash dishes, laundry), cooking. Better able to do her tasks a little bit when she does therapy but gets harder when she stops.  Feels burning and stinging in both her knees, and bilateral anterior and lateral legs.  Pt states not having back or neck pain. Just a stiff neck.  Pt states she usually walks with a cane on her R side. No falls within the last 6 months.     Patient Stated Goals Be better able to walk, and get around better without the stinging and burning in her knees and legs.    Currently in Pain? No/denies   Pain Score 0-No pain   Pain Onset More than a month ago  PT Education - 01/19/17 1617    Education provided Yes   Education Details ther-ex, neutral thighs for sit <> stand   Person(s) Educated Patient   Methods Explanation;Demonstration;Tactile cues;Verbal cues   Comprehension Verbalized understanding;Returned demonstration        Objectives  Pt did not wear her R AFO     There-ex  Sitting on dyna disc   Hip adduction ball squeeze and glute max squeeze 10x2 with 5 second holds   Sit <> stand 10x3. Cues for proper foot placement, bilateral femoral control. Felt better for knees. Decreased posterior knee stiffness.    Manually resisted medial hamstrings flexion    R 10x3. Felt good for knee per pt   L 10x2   R hip flexion with PT assist and holds throughout the range 5x   R ankle DF  with PT assist 10x   Trunk flexion isometrics with PT manual resistance 10x5 seconds for 3 sets  Improved exercise technique, movement at target joints, use of target muscles after min to mod verbal, visual, tactile cues.   Continued working on bilateral femoral control with activities such as sit <> stand and more even weight distribution instead of pt placing most her her weight on R LE to decrease bilateral knee pain. Also worked on improving medial hamstring strengthening to promote more neutral positioning of tibia to help decrease knee pain as well as trunk strengthening and R hip flexion to promote foot clearance during gait. No L leg burning sensation after session.           PT Long Term Goals - 01/14/17 1241      PT LONG TERM GOAL #1   Title Pt will be independent with her HEP to promote LE strength in function.   Time 8   Period Weeks   Status On-going   Target Date 03/11/17     PT LONG TERM GOAL #2   Title Patient will improve bilateral LE strength by at least 1/2 MMT grade to promote ability to ambulate and perform functional tasks.    Time 8   Period Weeks   Status Achieved     PT LONG TERM GOAL #3   Title Patient will improve her LEFS score by at least 9 points as a demonstration of improved function.    Baseline 14/80 (07/29/2016); 18/80 (10/29/2016); 31/80 (12/09/2016); 18/80, pt however states increased bilateral knee pain recently (01/14/2017)   Time 8   Period Weeks   Status On-going   Target Date 03/11/17     PT LONG TERM GOAL #4   Title Pt will report being able to walk/stand with SPC over 35 min to promote mobility.    Baseline Pt states being able to walk for about 30 min (07/29/2016); about 1 hour per pt reports (09/07/2016)   Time 8   Period Weeks   Status Achieved     PT LONG TERM GOAL #5   Title Patient will have a decrease in bilateral LE pain to 5/10 or less at worst to promote ability to perform functional tasks.    Baseline 8/10 at worst  (07/29/2016); Minimal burning sensation bilateral knee and legs, no pain level number provided. Pt also states feeling pain in her knees during the weekend. No pain level provided (09/07/2016); 3/10 L leg burning sensation at most for the past 7 days (09/28/2016); bilateral knee burning sensation 8/10 at worst (10/29/2016); 7-8/10 bilateral knee pain, burning comes and goes (12/09/2016); 7-8/10 R knee, 5/10 L knee  pain at most for the past 7 days (01/14/2017)   Time 6   Period Weeks   Status Partially Met   Target Date 03/11/17     PT LONG TERM GOAL #6   Title Pt will improve seated R hip flexion strength to at least 3+/5 to promote ability to ambulate and perform standing tasks.    Baseline 2+/5 seated R hip flexion (09/28/2016), (10/29/2016); (12/09/2016), (01/14/2017)   Time 8   Period Weeks   Status On-going   Target Date 03/11/17     PT LONG TERM GOAL #7   Title Pt will improve her LEFS score to 40/80 or more as a demonstration of improved function.    Baseline 31/80 (12/09/2016); 18/80, pt however states increased bilateral knee pain recently (01/14/2017)   Time 8   Period Weeks   Status On-going   Target Date 03/11/17               Plan - 01/19/17 1617    Clinical Impression Statement Continued working on bilateral femoral control with activities such as sit <> stand and more even weight distribution instead of pt placing most her her weight on R LE to decrease bilateral knee pain. Also worked on improving medial hamstring strengthening to promote more neutral positioning of tibia to help decrease knee pain as well as trunk strengthening and R hip flexion to promote foot clearance during gait. No L leg burning sensation after session.    History and Personal Factors relevant to plan of care: Chronicity of condition, weakness   Clinical Presentation Stable   Clinical Presentation due to: decreased knee pain with sit <> stand with femoral control; no L leg burning sensation after session    Clinical Decision Making Low   Rehab Potential Fair   Clinical Impairments Affecting Rehab Potential Chronicity of condition, posible decreasing motivation (observed)   PT Frequency 2x / week   PT Duration 8 weeks   PT Treatment/Interventions Electrical Stimulation;Aquatic Therapy;Ultrasound;Gait training;Functional mobility training;Therapeutic activities;Therapeutic exercise;Balance training;Neuromuscular re-education;Patient/family education;Manual techniques;Dry needling   PT Next Visit Plan LE strengthening, modalities PRN   Consulted and Agree with Plan of Care Patient      Patient will benefit from skilled therapeutic intervention in order to improve the following deficits and impairments:  Pain, Abnormal gait, Decreased balance, Decreased range of motion, Decreased strength, Difficulty walking  Visit Diagnosis: Muscle weakness (generalized)  Difficulty in walking, not elsewhere classified  Left knee pain, unspecified chronicity  Right knee pain, unspecified chronicity  Pain in left leg     Problem List Patient Active Problem List   Diagnosis Date Noted  . Weakness 11/03/2012    [name] PT, DPT   01/19/2017, 4:44 PM   [facility] [address] [place], [address] Phone: [phone]   Fax:  [phone]  Name: [name] MRN: [id number] Date of Birth: 10-29-1945

## 2017-01-20 ENCOUNTER — Encounter

## 2017-01-21 ENCOUNTER — Ambulatory Visit: Payer: Medicare Other | Attending: Physician Assistant

## 2017-01-21 DIAGNOSIS — M6281 Muscle weakness (generalized): Secondary | ICD-10-CM | POA: Diagnosis not present

## 2017-01-21 DIAGNOSIS — M25561 Pain in right knee: Secondary | ICD-10-CM

## 2017-01-21 DIAGNOSIS — M25562 Pain in left knee: Secondary | ICD-10-CM | POA: Insufficient documentation

## 2017-01-21 DIAGNOSIS — R262 Difficulty in walking, not elsewhere classified: Secondary | ICD-10-CM | POA: Diagnosis not present

## 2017-01-21 NOTE — Therapy (Signed)
[facility] [address] [place], [address] Phone: [phone]   Fax:  [phone]  Physical Therapy Treatment  Patient Details  Name: [name] MRN: [id number] Date of Birth: Nov 24, 1945 Referring Provider: Shary Decamp, [place]  Encounter Date: 01/21/2017      PT End of Session - 01/21/17 1129    Visit Number 29   Number of Visits 53   Date for PT Re-Evaluation 03/11/17   Authorization Type 6   Authorization Time Period of 10 g code   PT Start Time 1129  pt arrived late   PT Stop Time 1211   PT Time Calculation (min) 42 min   Activity Tolerance Patient tolerated treatment well   Behavior During Therapy [facility] for tasks assessed/performed      Past Medical History:  Diagnosis Date  . Arthritis   . Depression     Past Surgical History:  Procedure Laterality Date  . ABDOMINAL HYSTERECTOMY  1995  . back sugery    . BUNIONECTOMY  2013   rt foot  . COLONOSCOPY    . MUSCLE BIOPSY Left 10/03/2012   Procedure: LEFT QUADRICEP MUSCLE BIOPSY;  Surgeon: Odis Hollingshead, MD;  Location: [facility];  Service: General;  Laterality: Left;  . NECK SURGERY  2010   cerv disc fused     There were no vitals filed for this visit.      Subjective Assessment - 01/21/17 1131    Subjective Pt states today is not a good day. Also tired of going to doctor appointments. Has been trying to be aware of her knees (femoral control ) with sit <> stand and the swelling is going down.  Knees feel good no pain or discomfort. It's amazing this morning. I hope it lasts.    Pertinent History LE weakness.  Symptoms occured suddenly, unknown method of injury prior to her first neck fusion surgery on January 2010. Had lower back surgery fusion in 2011.  The neck and back surgeries did not help. Pt states having increased urinary urgency and takes medication for for it. MD aware.  Denies saddle anesthesia.  Pt states  that her doctor told her that PT is the only thing that is going to help her keep moving so she continues to participate in PT.  Last round of PT was last year which helped.  Currently has difficulty walking, performing chores (wash dishes, laundry), cooking. Better able to do her tasks a little bit when she does therapy but gets harder when she stops.  Feels burning and stinging in both her knees, and bilateral anterior and lateral legs.  Pt states not having back or neck pain. Just a stiff neck.  Pt states she usually walks with a cane on her R side. No falls within the last 6 months.     Patient Stated Goals Be better able to walk, and get around better without the stinging and burning in her knees and legs.    Currently in Pain? No/denies   Pain Score 0-No pain   Pain Onset More than a month ago                                 PT Education - 01/21/17 1153    Education provided Yes   Education Details ther-ex, HEP   Person(s) Educated Patient   Methods Explanation;Demonstration;Tactile cues;Verbal cues  Comprehension Returned demonstration;Verbalized understanding        Objectives  Pt did not wear her R AFO     There-ex    Sitting on dyna disc    Sit <> stand 10x2. Cues for proper foot placement, bilateral femoral control.    knee flexion targeting medial hamstrings yellow band resistance   R 10x3   L 10x3              R ankle DF with PT assist 10x2              Trunk flexion isometrics with PT manual resistance 10x5 seconds for 3 sets                R hip flexion with PT assist and holds throughout the range 5x3 with 2 second holds throughout range  Standing R LE leg press resisting double blue band 10x3 with bilateral UE assist to promote R glute strength and assisted R hip flexion in standing.   Standing R toe taps onto treadmill platform with bilateral UE assist 10x to promote hip flexion during swing phase of gait.      Improved exercise technique, movement at target joints, use of target muscles after min to mod verbal, visual, tactile cues.    Improved bilateral knee pain, some improvement in consistency of femoral control observed. Continued working on improving medial hamstrings use to promote more neutral tibia, improving femoral control, trunk and hip strengthening to promote ability to clear R LE during gait and decrease knee pain. Pt tolerated session well without aggravation of symptoms.          PT Long Term Goals - 01/14/17 1241      PT LONG TERM GOAL #1   Title Pt will be independent with her HEP to promote LE strength in function.   Time 8   Period Weeks   Status On-going   Target Date 03/11/17     PT LONG TERM GOAL #2   Title Patient will improve bilateral LE strength by at least 1/2 MMT grade to promote ability to ambulate and perform functional tasks.    Time 8   Period Weeks   Status Achieved     PT LONG TERM GOAL #3   Title Patient will improve her LEFS score by at least 9 points as a demonstration of improved function.    Baseline 14/80 (07/29/2016); 18/80 (10/29/2016); 31/80 (12/09/2016); 18/80, pt however states increased bilateral knee pain recently (01/14/2017)   Time 8   Period Weeks   Status On-going   Target Date 03/11/17     PT LONG TERM GOAL #4   Title Pt will report being able to walk/stand with SPC over 35 min to promote mobility.    Baseline Pt states being able to walk for about 30 min (07/29/2016); about 1 hour per pt reports (09/07/2016)   Time 8   Period Weeks   Status Achieved     PT LONG TERM GOAL #5   Title Patient will have a decrease in bilateral LE pain to 5/10 or less at worst to promote ability to perform functional tasks.    Baseline 8/10 at worst (07/29/2016); Minimal burning sensation bilateral knee and legs, no pain level number provided. Pt also states feeling pain in her knees during the weekend. No pain level provided (09/07/2016); 3/10 L leg  burning sensation at most for the past 7 days (09/28/2016); bilateral knee burning sensation 8/10 at worst (10/29/2016); 7-8/10 bilateral knee  pain, burning comes and goes (12/09/2016); 7-8/10 R knee, 5/10 L knee pain at most for the past 7 days (01/14/2017)   Time 6   Period Weeks   Status Partially Met   Target Date 03/11/17     PT LONG TERM GOAL #6   Title Pt will improve seated R hip flexion strength to at least 3+/5 to promote ability to ambulate and perform standing tasks.    Baseline 2+/5 seated R hip flexion (09/28/2016), (10/29/2016); (12/09/2016), (01/14/2017)   Time 8   Period Weeks   Status On-going   Target Date 03/11/17     PT LONG TERM GOAL #7   Title Pt will improve her LEFS score to 40/80 or more as a demonstration of improved function.    Baseline 31/80 (12/09/2016); 18/80, pt however states increased bilateral knee pain recently (01/14/2017)   Time 8   Period Weeks   Status On-going   Target Date 03/11/17               Plan - 01/21/17 1153    Clinical Impression Statement Improved bilateral knee pain, some improvement in consistency of femoral control observed. Continued working on improving medial hamstrings use to promote more neutral tibia, improving femoral control, trunk and hip strengthening to promote ability to clear R LE during gait and decrease knee pain. Pt tolerated session well without aggravation of symptoms.    History and Personal Factors relevant to plan of care: Chronicity of condition, weakness   Clinical Presentation Stable   Clinical Presentation due to: Good carry over of decreased knee pain   Clinical Decision Making Low   Rehab Potential Fair   Clinical Impairments Affecting Rehab Potential Chronicity of condition, posible decreasing motivation (observed)   PT Frequency 2x / week   PT Duration 8 weeks   PT Treatment/Interventions Electrical Stimulation;Aquatic Therapy;Ultrasound;Gait training;Functional mobility training;Therapeutic  activities;Therapeutic exercise;Balance training;Neuromuscular re-education;Patient/family education;Manual techniques;Dry needling   PT Next Visit Plan LE strengthening, modalities PRN   Consulted and Agree with Plan of Care Patient      Patient will benefit from skilled therapeutic intervention in order to improve the following deficits and impairments:  Pain, Abnormal gait, Decreased balance, Decreased range of motion, Decreased strength, Difficulty walking  Visit Diagnosis: Muscle weakness (generalized)  Difficulty in walking, not elsewhere classified  Left knee pain, unspecified chronicity  Right knee pain, unspecified chronicity     Problem List Patient Active Problem List   Diagnosis Date Noted  . Weakness 11/03/2012    [name] PT, DPT   01/21/2017, 12:34 PM  [facility] [address] [place], [address] Phone: [phone]   Fax:  [phone]  Name: [name] MRN: [id number] Date of Birth: 1945-11-07

## 2017-01-21 NOTE — Patient Instructions (Addendum)
  Reviewed and given seated knee flexion targeting medial hamstrings resisting yellow band as part of her HEP 10x3 each LE. Yellow band and handout provided. Pt demonstrated and verbalized understanding.

## 2017-01-25 ENCOUNTER — Ambulatory Visit: Payer: Medicare Other

## 2017-01-26 DIAGNOSIS — M25561 Pain in right knee: Secondary | ICD-10-CM | POA: Diagnosis not present

## 2017-01-26 DIAGNOSIS — M170 Bilateral primary osteoarthritis of knee: Secondary | ICD-10-CM | POA: Diagnosis not present

## 2017-01-26 DIAGNOSIS — M25562 Pain in left knee: Secondary | ICD-10-CM | POA: Diagnosis not present

## 2017-01-27 ENCOUNTER — Ambulatory Visit: Payer: Medicare Other

## 2017-01-27 DIAGNOSIS — M25561 Pain in right knee: Secondary | ICD-10-CM | POA: Diagnosis not present

## 2017-01-27 DIAGNOSIS — M170 Bilateral primary osteoarthritis of knee: Secondary | ICD-10-CM | POA: Diagnosis not present

## 2017-01-27 DIAGNOSIS — R262 Difficulty in walking, not elsewhere classified: Secondary | ICD-10-CM | POA: Diagnosis not present

## 2017-01-27 DIAGNOSIS — M6281 Muscle weakness (generalized): Secondary | ICD-10-CM | POA: Diagnosis not present

## 2017-01-27 DIAGNOSIS — M25562 Pain in left knee: Secondary | ICD-10-CM

## 2017-01-27 NOTE — Therapy (Signed)
[facility] [address], [place], [address] Phone: [phone]   Fax:  [phone]  Physical Therapy Treatment  Patient Details  Name: [name] MRN: [id number] Date of Birth: 04/17/1945 Referring Provider: Shary Decamp, [place]   Encounter Date: 01/27/2017  PT End of Session - 01/27/17 1345    Visit Number  30    Number of Visits  53    Date for PT Re-Evaluation  03/11/17    Authorization Type  7    Authorization Time Period  of 10 g code    PT Start Time  1540    PT Stop Time  1416    PT Time Calculation (min)  31 min    Activity Tolerance  Patient tolerated treatment well    Behavior During Therapy  [facility] for tasks assessed/performed       Past Medical History:  Diagnosis Date  . Arthritis   . Depression     Past Surgical History:  Procedure Laterality Date  . ABDOMINAL HYSTERECTOMY  1995  . back sugery    . BUNIONECTOMY  2013   rt foot  . COLONOSCOPY    . NECK SURGERY  2010   cerv disc fused     There were no vitals filed for this visit.  Subjective Assessment - 01/27/17 1347    Subjective  Pt states that she is getting an injection for her L knee today at 2:45 pm. Got an injection for her R knee Monday and it actually feels better.  No knee pain. Just burning as usual.   Returning from [place] on 02/07/2017    Pertinent History  LE weakness.  Symptoms occured suddenly, unknown method of injury prior to her first neck fusion surgery on January 2010. Had lower back surgery fusion in 2011.  The neck and back surgeries did not help. Pt states having increased urinary urgency and takes medication for for it. MD aware.  Denies saddle anesthesia.  Pt states that her doctor told her that PT is the only thing that is going to help her keep moving so she continues to participate in PT.  Last round of PT was last year which helped.  Currently has difficulty walking, performing chores (wash dishes,  laundry), cooking. Better able to do her tasks a little bit when she does therapy but gets harder when she stops.  Feels burning and stinging in both her knees, and bilateral anterior and lateral legs.  Pt states not having back or neck pain. Just a stiff neck.  Pt states she usually walks with a cane on her R side. No falls within the last 6 months.      Patient Stated Goals  Be better able to walk, and get around better without the stinging and burning in her knees and legs.     Currently in Pain?  No/denies    Pain Score  0-No pain    Pain Onset  More than a month ago                              PT Education - 01/27/17 1400    Education provided  Yes    Education Details  ther-ex    [employer]) Educated  Patient    Methods  Explanation;Demonstration;Tactile cues;Verbal cues    Comprehension  Verbalized understanding;Returned demonstration         Objectives  Returning  from [place] on 02/07/2017     There-ex    Sitting on dyna disc      R ankle DF with PT assist 10x2                 knee flexion targeting medial hamstrings yellow band resistance                        R 10x3               L 10x3              Sit <>stand 10x2. Cues for proper foot placement, bilateral femoral control.                Trunk flexion isometrics with PT manual resistance 10x5 seconds for 3 sets  Standing R LE leg press resisting double blue and double green band 10x3 with bilateral UE assist to promote R glute strength and assisted R hip flexion in standing.   Standing R toe tap with bilateral UE assist 10x3 to promote R hip flexor muscle use   Improved exercise technique, movement at target joints, use of target muscles after min to mod verbal, visual, tactile cues.     Continued working on trunk strengthening, and hip flexion strength to promote ability to pick up R foot during swing phase of gait, as well as working on medial hamstring  strengthening and femoral control to promote more neutral positioning of bilateral knees with closed chain tasks to help decrease knee pain. Pt tolerated session well without aggravation of symptoms. Session ended early to allow driving time to [facility] for her doctor's appointment at 2:45 pm.      PT Long Term Goals - 01/14/17 1241      PT LONG TERM GOAL #1   Title  Pt will be independent with her HEP to promote LE strength in function.    Time  8    Period  Weeks    Status  On-going    Target Date  03/11/17      PT LONG TERM GOAL #2   Title  Patient will improve bilateral LE strength by at least 1/2 MMT grade to promote ability to ambulate and perform functional tasks.     Time  8    Period  Weeks    Status  Achieved      PT LONG TERM GOAL #3   Title  Patient will improve her LEFS score by at least 9 points as a demonstration of improved function.     Baseline  14/80 (07/29/2016); 18/80 (10/29/2016); 31/80 (12/09/2016); 18/80, pt however states increased bilateral knee pain recently (01/14/2017)    Time  8    Period  Weeks    Status  On-going    Target Date  03/11/17      PT LONG TERM GOAL #4   Title  Pt will report being able to walk/stand with SPC over 35 min to promote mobility.     Baseline  Pt states being able to walk for about 30 min (07/29/2016); about 1 hour per pt reports (09/07/2016)    Time  8    Period  Weeks    Status  Achieved      PT LONG TERM GOAL #5   Title  Patient will have a decrease in bilateral LE pain to 5/10 or less at worst to promote ability to perform functional tasks.     Baseline  8/10 at worst (07/29/2016); Minimal burning sensation bilateral knee and legs, no pain level number provided. Pt also states feeling pain in her knees during the weekend. No pain level provided (09/07/2016); 3/10 L leg burning sensation at most for the past 7 days (09/28/2016); bilateral knee burning sensation 8/10 at worst (10/29/2016); 7-8/10 bilateral knee pain, burning comes and  goes (12/09/2016); 7-8/10 R knee, 5/10 L knee pain at most for the past 7 days (01/14/2017)    Time  6    Period  Weeks    Status  Partially Met    Target Date  03/11/17      PT LONG TERM GOAL #6   Title  Pt will improve seated R hip flexion strength to at least 3+/5 to promote ability to ambulate and perform standing tasks.     Baseline  2+/5 seated R hip flexion (09/28/2016), (10/29/2016); (12/09/2016), (01/14/2017)    Time  8    Period  Weeks    Status  On-going    Target Date  03/11/17      PT LONG TERM GOAL #7   Title  Pt will improve her LEFS score to 40/80 or more as a demonstration of improved function.     Baseline  31/80 (12/09/2016); 18/80, pt however states increased bilateral knee pain recently (01/14/2017)    Time  8    Period  Weeks    Status  On-going    Target Date  03/11/17            Plan - 01/27/17 1425    Clinical Impression Statement  Continued working on trunk strengthening, and hip flexion strength to promote ability to pick up R foot during swing phase of gait, as well as working on medial hamstring strengthening and femoral control to promote more neutral positioning of bilateral knees with closed chain tasks to help decrease knee pain. Pt tolerated session well without aggravation of symptoms. Session ended early to allow driving time to [facility] for her doctor's appointment at 2:45 pm.    History and Personal Factors relevant to plan of care:  Chronicity of condition, weakness    Clinical Presentation  Stable    Clinical Presentation due to:  Pt tolerated session well without aggravation of symptoms.     Clinical Decision Making  Low    Rehab Potential  Fair    Clinical Impairments Affecting Rehab Potential  Chronicity of condition, posible decreasing motivation (observed)    PT Frequency  2x / week    PT Duration  8 weeks    PT Treatment/Interventions  Electrical Stimulation;Aquatic Therapy;Ultrasound;Gait training;Functional mobility  training;Therapeutic activities;Therapeutic exercise;Balance training;Neuromuscular re-education;Patient/family education;Manual techniques;Dry needling    PT Next Visit Plan  LE strengthening, modalities PRN    Consulted and Agree with Plan of Care  Patient       Patient will benefit from skilled therapeutic intervention in order to improve the following deficits and impairments:  Pain, Abnormal gait, Decreased balance, Decreased range of motion, Decreased strength, Difficulty walking  Visit Diagnosis: Muscle weakness (generalized)  Difficulty in walking, not elsewhere classified  Left knee pain, unspecified chronicity  Right knee pain, unspecified chronicity     Problem List Patient Active Problem List   Diagnosis Date Noted  . Weakness 11/03/2012   [name] PT, DPT   01/27/2017, 4:08 PM  [facility] [address] [place], [address] Phone: [phone]   Fax:  [phone]  Name: Louella  CORBYN STEEDMAN MRN: [id number] Date of Birth: [birth year]

## 2017-02-16 ENCOUNTER — Ambulatory Visit: Payer: Medicare Other

## 2017-02-17 ENCOUNTER — Telehealth: Payer: Self-pay

## 2017-02-17 ENCOUNTER — Ambulatory Visit: Payer: Medicare Other

## 2017-02-17 NOTE — Telephone Encounter (Signed)
No show. Called pt who said that she did not think she had an appointment today. Will be able to make it to her 02/22/17 and 02/25/17 appointments.

## 2017-02-22 ENCOUNTER — Ambulatory Visit: Payer: Medicare Other | Attending: Physician Assistant

## 2017-02-22 DIAGNOSIS — M25562 Pain in left knee: Secondary | ICD-10-CM | POA: Diagnosis not present

## 2017-02-22 DIAGNOSIS — R262 Difficulty in walking, not elsewhere classified: Secondary | ICD-10-CM | POA: Diagnosis not present

## 2017-02-22 DIAGNOSIS — M6281 Muscle weakness (generalized): Secondary | ICD-10-CM | POA: Diagnosis not present

## 2017-02-22 DIAGNOSIS — M79605 Pain in left leg: Secondary | ICD-10-CM | POA: Insufficient documentation

## 2017-02-22 DIAGNOSIS — M25561 Pain in right knee: Secondary | ICD-10-CM | POA: Diagnosis not present

## 2017-02-22 DIAGNOSIS — M79604 Pain in right leg: Secondary | ICD-10-CM | POA: Insufficient documentation

## 2017-02-22 NOTE — Therapy (Signed)
[facility] [address], [place], [address] Phone: [phone]   Fax:  [phone]  Physical Therapy Treatment  Patient Details  Name: [name] MRN: [id number] Date of Birth: May 08, 1945 Referring Provider: Shary Decamp, [place]   Encounter Date: 02/22/2017  PT End of Session - 02/22/17 1359    Visit Number  31    Number of Visits  53    Date for PT Re-Evaluation  03/11/17    Authorization Type  8    Authorization Time Period  of 10 g code    PT Start Time  1400 pt arrived late    PT Stop Time  1429    PT Time Calculation (min)  29 min    Activity Tolerance  Patient tolerated treatment well    Behavior During Therapy  [facility] for tasks assessed/performed       Past Medical History:  Diagnosis Date  . Arthritis   . Depression     Past Surgical History:  Procedure Laterality Date  . ABDOMINAL HYSTERECTOMY  1995  . back sugery    . BUNIONECTOMY  2013   rt foot  . COLONOSCOPY    . MUSCLE BIOPSY Left 10/03/2012   Procedure: LEFT QUADRICEP MUSCLE BIOPSY;  Surgeon: Odis Hollingshead, MD;  Location: [place];  Service: General;  Laterality: Left;  . NECK SURGERY  2010   cerv disc fused     There were no vitals filed for this visit.  Subjective Assessment - 02/22/17 1401    Subjective  Pt states that she is tired today. Has difficult time getting to the swing of things. Did not wear her AFO because it feels heavy.  Denies fever or flu like symptoms.  Pt also adds towards end of session that she can tell that she has not been exercising (secondary to fatigue)    Pertinent History  LE weakness.  Symptoms occured suddenly, unknown method of injury prior to her first neck fusion surgery on January 2010. Had lower back surgery fusion in 2011.  The neck and back surgeries did not help. Pt states having increased urinary urgency and takes medication for for it. MD aware.  Denies saddle  anesthesia.  Pt states that her doctor told her that PT is the only thing that is going to help her keep moving so she continues to participate in PT.  Last round of PT was last year which helped.  Currently has difficulty walking, performing chores (wash dishes, laundry), cooking. Better able to do her tasks a little bit when she does therapy but gets harder when she stops.  Feels burning and stinging in both her knees, and bilateral anterior and lateral legs.  Pt states not having back or neck pain. Just a stiff neck.  Pt states she usually walks with a cane on her R side. No falls within the last 6 months.      Patient Stated Goals  Be better able to walk, and get around better without the stinging and burning in her knees and legs.     Currently in Pain?  No/denies    Pain Onset  More than a month ago                              PT Education - 02/22/17 1415    Education provided  Yes    Education Details  ther-ex    Person(s) Educated  Patient    Methods  Explanation;Demonstration;Tactile cues;Verbal cues    Comprehension  Returned demonstration;Verbalized understanding         Objectives       There-ex    Sitting on dyna disc Trunk flexion isometrics with PT manual resistance 10x5 seconds for 3 sets    Assisted R hip flexion 5x3 seconds holds at various ranges for 3 sets   Standing R LE leg press resisting double blue and double green band 10x3 with bilateral UE assist to promote R glute strength and assisted R hip flexion in standing.  Standing R hip abduction 10x 3 with bilateral UE assist   Standing R toe tap with bilateral UE assist 10x, then 7x to promote R hip flexor muscle use  Difficult to perform today compared to prior her trip per pt.    Improved exercise technique, movement at target joints, use of target muscles after min to mod verbal, visual, tactile cues.   Pt demonstrates no bilateral knee or leg pain  today. Continued working on improving trunk muscle activation, R hip flexor strength to promote ability to perform swing phase of gait and R glute med muscle strengthening to promote ability to ambulate. Slightly more fatigue today compared to previous session prior to her trip. Pt states that she has not been exercising as much recently.        PT Long Term Goals - 01/14/17 1241      PT LONG TERM GOAL #1   Title  Pt will be independent with her HEP to promote LE strength in function.    Time  8    Period  Weeks    Status  On-going    Target Date  03/11/17      PT LONG TERM GOAL #2   Title  Patient will improve bilateral LE strength by at least 1/2 MMT grade to promote ability to ambulate and perform functional tasks.     Time  8    Period  Weeks    Status  Achieved      PT LONG TERM GOAL #3   Title  Patient will improve her LEFS score by at least 9 points as a demonstration of improved function.     Baseline  14/80 (07/29/2016); 18/80 (10/29/2016); 31/80 (12/09/2016); 18/80, pt however states increased bilateral knee pain recently (01/14/2017)    Time  8    Period  Weeks    Status  On-going    Target Date  03/11/17      PT LONG TERM GOAL #4   Title  Pt will report being able to walk/stand with SPC over 35 min to promote mobility.     Baseline  Pt states being able to walk for about 30 min (07/29/2016); about 1 hour per pt reports (09/07/2016)    Time  8    Period  Weeks    Status  Achieved      PT LONG TERM GOAL #5   Title  Patient will have a decrease in bilateral LE pain to 5/10 or less at worst to promote ability to perform functional tasks.     Baseline  8/10 at worst (07/29/2016); Minimal burning sensation bilateral knee and legs, no pain level number provided. Pt also states feeling pain in her knees during the weekend. No pain level provided (09/07/2016); 3/10 L leg burning sensation at most for the past 7 days (09/28/2016); bilateral knee burning sensation 8/10 at worst (  10/29/2016);  7-8/10 bilateral knee pain, burning comes and goes (12/09/2016); 7-8/10 R knee, 5/10 L knee pain at most for the past 7 days (01/14/2017)    Time  6    Period  Weeks    Status  Partially Met    Target Date  03/11/17      PT LONG TERM GOAL #6   Title  Pt will improve seated R hip flexion strength to at least 3+/5 to promote ability to ambulate and perform standing tasks.     Baseline  2+/5 seated R hip flexion (09/28/2016), (10/29/2016); (12/09/2016), (01/14/2017)    Time  8    Period  Weeks    Status  On-going    Target Date  03/11/17      PT LONG TERM GOAL #7   Title  Pt will improve her LEFS score to 40/80 or more as a demonstration of improved function.     Baseline  31/80 (12/09/2016); 18/80, pt however states increased bilateral knee pain recently (01/14/2017)    Time  8    Period  Weeks    Status  On-going    Target Date  03/11/17            Plan - 02/22/17 1429    Clinical Impression Statement  Pt demonstrates no bilateral knee or leg pain today. Continued working on improving trunk muscle activation, R hip flexor strength to promote ability to perform swing phase of gait and R glute med muscle strengthening to promote ability to ambulate. Slightly more fatigue today compared to previous session prior to her trip. Pt states that she has not been exercising as much recently.     History and Personal Factors relevant to plan of care:  Chronicity of condition, weakness     Clinical Presentation  Stable    Clinical Presentation due to:  Pt tolerated session well without complain of pain.     Clinical Decision Making  Low    Rehab Potential  Fair    Clinical Impairments Affecting Rehab Potential  Chronicity of condition, posible decreasing motivation (observed)    PT Frequency  2x / week    PT Duration  8 weeks    PT Treatment/Interventions  Electrical Stimulation;Aquatic Therapy;Ultrasound;Gait training;Functional mobility training;Therapeutic activities;Therapeutic  exercise;Balance training;Neuromuscular re-education;Patient/family education;Manual techniques;Dry needling    PT Next Visit Plan  LE strengthening, modalities PRN    Consulted and Agree with Plan of Care  Patient       Patient will benefit from skilled therapeutic intervention in order to improve the following deficits and impairments:  Pain, Abnormal gait, Decreased balance, Decreased range of motion, Decreased strength, Difficulty walking  Visit Diagnosis: Muscle weakness (generalized)  Difficulty in walking, not elsewhere classified     Problem List Patient Active Problem List   Diagnosis Date Noted  . Weakness 11/03/2012   [name] PT, DPT   02/22/2017, 5:45 PM  [facility] [address], [place], [address] Phone: [phone]   Fax:  [phone]  Name: [name] MRN: [id number] Date of Birth: 02-09-1946

## 2017-02-25 ENCOUNTER — Ambulatory Visit: Payer: Medicare Other

## 2017-03-01 ENCOUNTER — Ambulatory Visit: Payer: Medicare Other

## 2017-03-04 ENCOUNTER — Ambulatory Visit: Payer: Medicare Other

## 2017-03-04 ENCOUNTER — Telehealth: Payer: Self-pay

## 2017-03-04 NOTE — Telephone Encounter (Signed)
No show. Called patient who said that she is stuck at her home. Icy outside. Legs feel stiff but other than that, she is fine. Knee is swollen but getting better. Will be able to come to her next 2 follow up appointments.

## 2017-03-08 ENCOUNTER — Ambulatory Visit: Payer: Medicare Other

## 2017-03-08 DIAGNOSIS — M25561 Pain in right knee: Secondary | ICD-10-CM | POA: Diagnosis not present

## 2017-03-08 DIAGNOSIS — M6281 Muscle weakness (generalized): Secondary | ICD-10-CM

## 2017-03-08 DIAGNOSIS — M25562 Pain in left knee: Secondary | ICD-10-CM | POA: Diagnosis not present

## 2017-03-08 DIAGNOSIS — M79605 Pain in left leg: Secondary | ICD-10-CM | POA: Diagnosis not present

## 2017-03-08 DIAGNOSIS — R262 Difficulty in walking, not elsewhere classified: Secondary | ICD-10-CM

## 2017-03-08 DIAGNOSIS — M79604 Pain in right leg: Secondary | ICD-10-CM

## 2017-03-08 NOTE — Therapy (Signed)
[facility] [address], [place], [address] Phone: [phone]   Fax:  [phone]  Physical Therapy Treatment  Patient Details  Name: [name] MRN: [id number] Date of Birth: 01-29-1946 Referring Provider: Shary Decamp, [place]   Encounter Date: 03/08/2017  PT End of Session - 03/08/17 1308    Visit Number  32    Number of Visits  53    Date for PT Re-Evaluation  03/11/17    Authorization Type  9    Authorization Time Period  of 10 g code    PT Start Time  1308 pt arrived late    PT Stop Time  1351    PT Time Calculation (min)  43 min    Activity Tolerance  Patient tolerated treatment well    Behavior During Therapy  [facility] for tasks assessed/performed       Past Medical History:  Diagnosis Date  . Arthritis   . Depression     Past Surgical History:  Procedure Laterality Date  . ABDOMINAL HYSTERECTOMY  1995  . back sugery    . BUNIONECTOMY  2013   rt foot  . COLONOSCOPY    . MUSCLE BIOPSY Left 10/03/2012   Procedure: LEFT QUADRICEP MUSCLE BIOPSY;  Surgeon: Odis Hollingshead, MD;  Location: [place];  Service: General;  Laterality: Left;  . NECK SURGERY  2010   cerv disc fused     There were no vitals filed for this visit.  Subjective Assessment - 03/08/17 1309    Subjective  Pt states both knees are bothering her today. Trying to get an appointment with her doctor for her knees. Feels burning L lateral leg.  Feels pain in both knees.  Has been like this for the last 4-5 days.     Pertinent History  LE weakness.  Symptoms occured suddenly, unknown method of injury prior to her first neck fusion surgery on January 2010. Had lower back surgery fusion in 2011.  The neck and back surgeries did not help. Pt states having increased urinary urgency and takes medication for for it. MD aware.  Denies saddle anesthesia.  Pt states that her doctor told her that PT is the only thing that  is going to help her keep moving so she continues to participate in PT.  Last round of PT was last year which helped.  Currently has difficulty walking, performing chores (wash dishes, laundry), cooking. Better able to do her tasks a little bit when she does therapy but gets harder when she stops.  Feels burning and stinging in both her knees, and bilateral anterior and lateral legs.  Pt states not having back or neck pain. Just a stiff neck.  Pt states she usually walks with a cane on her R side. No falls within the last 6 months.      Patient Stated Goals  Be better able to walk, and get around better without the stinging and burning in her knees and legs.     Currently in Pain?  Yes    Pain Score  8  both knees and L leg burning.     Pain Onset  More than a month ago                              PT Education - 03/08/17 2123    Education provided  Yes  Education Details  ther-ex, neutral femur and feet with sit <> stand    Person(s) Educated  Patient    Methods  Explanation;Demonstration;Tactile cues;Verbal cues    Comprehension  Returned demonstration;Verbalized understanding         Objectives   Manual therapy   Supine IR L tibia grade 3-     Decreased L leg burning to 6/10   Supine STM to L lateral hamstrings      Less burning sensation afterwards per pt    Supine IR R tibia grade 3-     Pt states R knee joint feels better.    Ther-ex  Seated hip adduction ball and glute max squeeze 10x10 seconds  Sit <> stand from elevated mat table 10x3 with bilateral UE assist, emphasis on neutral thigh and feet and more even weight bearing secondary to pt placing too much pressure on R knee.     Increased time spent to promote better technique.    Pt states knees feel better. Less burning.   Sitting with proper posture   Gentle manual perturbation from PT 1 minute x 3 to promote trunk strengthening.   Improved exercise technique, movement at target  joints, use of target muscles after mod verbal, visual, tactile cues.    Decreased bilateral knee pain and burning following manual therapy to promote gentle IR movement of bilateral tibia, and decrease L lateral hamstrings tension, and exercise to promote femoral control exercises.       PT Long Term Goals - 01/14/17 1241      PT LONG TERM GOAL #1   Title  Pt will be independent with her HEP to promote LE strength in function.    Time  8    Period  Weeks    Status  On-going    Target Date  03/11/17      PT LONG TERM GOAL #2   Title  Patient will improve bilateral LE strength by at least 1/2 MMT grade to promote ability to ambulate and perform functional tasks.     Time  8    Period  Weeks    Status  Achieved      PT LONG TERM GOAL #3   Title  Patient will improve her LEFS score by at least 9 points as a demonstration of improved function.     Baseline  14/80 (07/29/2016); 18/80 (10/29/2016); 31/80 (12/09/2016); 18/80, pt however states increased bilateral knee pain recently (01/14/2017)    Time  8    Period  Weeks    Status  On-going    Target Date  03/11/17      PT LONG TERM GOAL #4   Title  Pt will report being able to walk/stand with SPC over 35 min to promote mobility.     Baseline  Pt states being able to walk for about 30 min (07/29/2016); about 1 hour per pt reports (09/07/2016)    Time  8    Period  Weeks    Status  Achieved      PT LONG TERM GOAL #5   Title  Patient will have a decrease in bilateral LE pain to 5/10 or less at worst to promote ability to perform functional tasks.     Baseline  8/10 at worst (07/29/2016); Minimal burning sensation bilateral knee and legs, no pain level number provided. Pt also states feeling pain in her knees during the weekend. No pain level provided (09/07/2016); 3/10 L leg burning sensation at most for the  past 7 days (09/28/2016); bilateral knee burning sensation 8/10 at worst (10/29/2016); 7-8/10 bilateral knee pain, burning comes and goes  (12/09/2016); 7-8/10 R knee, 5/10 L knee pain at most for the past 7 days (01/14/2017)    Time  6    Period  Weeks    Status  Partially Met    Target Date  03/11/17      PT LONG TERM GOAL #6   Title  Pt will improve seated R hip flexion strength to at least 3+/5 to promote ability to ambulate and perform standing tasks.     Baseline  2+/5 seated R hip flexion (09/28/2016), (10/29/2016); (12/09/2016), (01/14/2017)    Time  8    Period  Weeks    Status  On-going    Target Date  03/11/17      PT LONG TERM GOAL #7   Title  Pt will improve her LEFS score to 40/80 or more as a demonstration of improved function.     Baseline  31/80 (12/09/2016); 18/80, pt however states increased bilateral knee pain recently (01/14/2017)    Time  8    Period  Weeks    Status  On-going    Target Date  03/11/17            Plan - 03/08/17 2129    Clinical Impression Statement  Decreased bilateral knee pain and burning following manual therapy to promote gentle IR movement of bilateral tibia, and decrease L lateral hamstrings tension, and exercise to promote femoral control exercises.     History and Personal Factors relevant to plan of care:  Chronicity of condition, weakness    Clinical Presentation  Stable    Clinical Presentation due to:  Decreased pain after session    Clinical Decision Making  Low    Rehab Potential  Fair    Clinical Impairments Affecting Rehab Potential  Chronicity of condition, posible decreasing motivation (observed)    PT Frequency  2x / week    PT Duration  8 weeks    PT Treatment/Interventions  Electrical Stimulation;Aquatic Therapy;Ultrasound;Gait training;Functional mobility training;Therapeutic activities;Therapeutic exercise;Balance training;Neuromuscular re-education;Patient/family education;Manual techniques;Dry needling    PT Next Visit Plan  LE strengthening, modalities PRN    Consulted and Agree with Plan of Care  Patient       Patient will benefit from skilled  therapeutic intervention in order to improve the following deficits and impairments:  Pain, Abnormal gait, Decreased balance, Decreased range of motion, Decreased strength, Difficulty walking  Visit Diagnosis: Muscle weakness (generalized)  Difficulty in walking, not elsewhere classified  Left knee pain, unspecified chronicity  Right knee pain, unspecified chronicity  Pain in left leg  Pain in right leg     Problem List Patient Active Problem List   Diagnosis Date Noted  . Weakness 11/03/2012    [name] PT, DPT   03/08/2017, 9:35 PM  [facility] [address], [place], [address] Phone: [phone]   Fax:  [phone]  Name: [name] MRN: [id number] Date of Birth: Aug 14, 1945

## 2017-03-11 ENCOUNTER — Ambulatory Visit: Payer: Medicare Other

## 2017-03-31 ENCOUNTER — Ambulatory Visit: Payer: Medicare Other | Attending: Physician Assistant

## 2017-03-31 DIAGNOSIS — M79604 Pain in right leg: Secondary | ICD-10-CM | POA: Insufficient documentation

## 2017-03-31 DIAGNOSIS — M25561 Pain in right knee: Secondary | ICD-10-CM | POA: Insufficient documentation

## 2017-03-31 DIAGNOSIS — M79605 Pain in left leg: Secondary | ICD-10-CM | POA: Insufficient documentation

## 2017-03-31 DIAGNOSIS — M6281 Muscle weakness (generalized): Secondary | ICD-10-CM | POA: Insufficient documentation

## 2017-03-31 DIAGNOSIS — R262 Difficulty in walking, not elsewhere classified: Secondary | ICD-10-CM | POA: Insufficient documentation

## 2017-03-31 DIAGNOSIS — M25562 Pain in left knee: Secondary | ICD-10-CM | POA: Insufficient documentation

## 2017-04-02 DIAGNOSIS — M5136 Other intervertebral disc degeneration, lumbar region: Secondary | ICD-10-CM | POA: Diagnosis not present

## 2017-04-02 DIAGNOSIS — R739 Hyperglycemia, unspecified: Secondary | ICD-10-CM | POA: Diagnosis not present

## 2017-04-02 DIAGNOSIS — M6281 Muscle weakness (generalized): Secondary | ICD-10-CM | POA: Diagnosis not present

## 2017-04-02 DIAGNOSIS — G959 Disease of spinal cord, unspecified: Secondary | ICD-10-CM | POA: Diagnosis not present

## 2017-04-02 DIAGNOSIS — N390 Urinary tract infection, site not specified: Secondary | ICD-10-CM | POA: Diagnosis not present

## 2017-04-05 ENCOUNTER — Ambulatory Visit: Payer: Medicare Other

## 2017-04-05 DIAGNOSIS — Z780 Asymptomatic menopausal state: Secondary | ICD-10-CM | POA: Diagnosis not present

## 2017-04-05 DIAGNOSIS — Z0000 Encounter for general adult medical examination without abnormal findings: Secondary | ICD-10-CM | POA: Diagnosis not present

## 2017-04-07 ENCOUNTER — Ambulatory Visit: Payer: Medicare Other

## 2017-04-08 DIAGNOSIS — M6281 Muscle weakness (generalized): Secondary | ICD-10-CM | POA: Diagnosis not present

## 2017-04-08 DIAGNOSIS — M25561 Pain in right knee: Secondary | ICD-10-CM | POA: Diagnosis not present

## 2017-04-08 DIAGNOSIS — R5382 Chronic fatigue, unspecified: Secondary | ICD-10-CM | POA: Diagnosis not present

## 2017-04-08 DIAGNOSIS — M5136 Other intervertebral disc degeneration, lumbar region: Secondary | ICD-10-CM | POA: Diagnosis not present

## 2017-04-08 DIAGNOSIS — G959 Disease of spinal cord, unspecified: Secondary | ICD-10-CM | POA: Diagnosis not present

## 2017-04-12 ENCOUNTER — Ambulatory Visit: Payer: Medicare Other | Admitting: Physical Therapy

## 2017-04-12 ENCOUNTER — Encounter: Payer: Self-pay | Admitting: Physical Therapy

## 2017-04-12 DIAGNOSIS — M25562 Pain in left knee: Secondary | ICD-10-CM

## 2017-04-12 DIAGNOSIS — M6281 Muscle weakness (generalized): Secondary | ICD-10-CM

## 2017-04-12 DIAGNOSIS — M25561 Pain in right knee: Secondary | ICD-10-CM | POA: Diagnosis not present

## 2017-04-12 DIAGNOSIS — M79605 Pain in left leg: Secondary | ICD-10-CM | POA: Diagnosis not present

## 2017-04-12 DIAGNOSIS — M79604 Pain in right leg: Secondary | ICD-10-CM

## 2017-04-12 DIAGNOSIS — R262 Difficulty in walking, not elsewhere classified: Secondary | ICD-10-CM

## 2017-04-12 NOTE — Therapy (Signed)
[facility] [address] [place], [address] Phone: [phone]   Fax:  [phone]  Physical Therapy Treatment  Patient Details  Name: [name] MRN: [id number] Date of Birth: 03-Nov-1945 Referring Provider: Shary Decamp, [place]   Encounter Date: 04/12/2017  PT End of Session - 04/12/17 1309    Visit Number  33    Number of Visits  13    Date for PT Re-Evaluation  05/24/17    PT Start Time  1309    PT Stop Time  1343    PT Time Calculation (min)  34 min    Activity Tolerance  Patient tolerated treatment well    Behavior During Therapy  [facility] for tasks assessed/performed       Past Medical History:  Diagnosis Date  . Arthritis   . Depression     Past Surgical History:  Procedure Laterality Date  . ABDOMINAL HYSTERECTOMY  1995  . back sugery    . BUNIONECTOMY  2013   rt foot  . COLONOSCOPY    . MUSCLE BIOPSY Left 10/03/2012   Procedure: LEFT QUADRICEP MUSCLE BIOPSY;  Surgeon: Odis Hollingshead, MD;  Location: [place];  Service: General;  Laterality: Left;  . NECK SURGERY  2010   cerv disc fused     There were no vitals filed for this visit.  Subjective Assessment - 04/12/17 1312    Subjective  Pt arrived late, limiting session.  Pt has seen her PCP since last sessoin who sent her to the arthritis doctor who gave her a shot in her R knee.  Pt reports this helped for a few days and then her pain returned.  No new complaints or concerns.  Pt has been completing her HEP 3 days/wk.      Pertinent History  LE weakness.  Symptoms occured suddenly, unknown method of injury prior to her first neck fusion surgery on January 2010. Had lower back surgery fusion in 2011.  The neck and back surgeries did not help. Pt states having increased urinary urgency and takes medication for for it. MD aware.  Denies saddle anesthesia.  Pt states that her doctor told her that PT is the only thing that is  going to help her keep moving so she continues to participate in PT.  Last round of PT was last year which helped.  Currently has difficulty walking, performing chores (wash dishes, laundry), cooking. Better able to do her tasks a little bit when she does therapy but gets harder when she stops.  Feels burning and stinging in both her knees, and bilateral anterior and lateral legs.  Pt states not having back or neck pain. Just a stiff neck.  Pt states she usually walks with a cane on her R side. No falls within the last 6 months.      Patient Stated Goals  Be better able to walk, and get around better without the stinging and burning in her knees and legs.     Currently in Pain?  Yes    Pain Score  4     Pain Location  Leg    Pain Orientation  Left    Pain Descriptors / Indicators  Burning    Pain Onset  More than a month ago    Multiple Pain Sites  No         TREATMENT   LEFS: 20/80  Manual therapy:  Supine IR L tibia  grade 3. 3 bouts x30 seconds each.  Supine STM to L lateral hamstrings.  Supine IR R tibia grade 3. 3 bouts x30 seconds each.  ?  Therapeutic Exercise:  Supine BLE bridges 2x10 with cues for glute squeeze throughout  Hooklying marching 2x15 each LE  Standing R hip F with 1UE support 2x10 each LE  Seated R knee E 3x10 with cues to avoid compensatory trunk E.                       PT Education - 04/12/17 1308    Education provided  Yes    Education Details  Exercise technique; POC    Person(s) Educated  Patient    Methods  Explanation;Demonstration;Verbal cues    Comprehension  Verbalized understanding;Returned demonstration;Verbal cues required;Need further instruction          PT Long Term Goals - 04/12/17 1314      PT LONG TERM GOAL #1   Title  Pt will be independent with her HEP to promote LE strength in function.    Time  8    Period  Weeks    Status  On-going      PT LONG TERM GOAL #2   Title  Patient will improve bilateral LE  strength by at least 1/2 MMT grade to promote ability to ambulate and perform functional tasks.     Time  8    Period  Weeks    Status  Achieved      PT LONG TERM GOAL #3   Title  Patient will improve her LEFS score by at least 9 points as a demonstration of improved function.     Baseline  14/80 (07/29/2016); 18/80 (10/29/2016); 31/80 (12/09/2016); 18/80, pt however states increased bilateral knee pain recently (01/14/2017); 20/80 (04/12/17)    Time  8    Period  Weeks    Status  On-going      PT LONG TERM GOAL #4   Title  Pt will report being able to walk/stand with SPC over 35 min to promote mobility.     Baseline  Pt states being able to walk for about 30 min (07/29/2016); about 1 hour per pt reports (09/07/2016)    Time  8    Period  Weeks    Status  Achieved      PT LONG TERM GOAL #5   Title  Patient will have a decrease in bilateral LE pain to 5/10 or less at worst to promote ability to perform functional tasks.     Baseline  8/10 at worst (07/29/2016); Minimal burning sensation bilateral knee and legs, no pain level number provided. Pt also states feeling pain in her knees during the weekend. No pain level provided (09/07/2016); 3/10 L leg burning sensation at most for the past 7 days (09/28/2016); bilateral knee burning sensation 8/10 at worst (10/29/2016); 7-8/10 bilateral knee pain, burning comes and goes (12/09/2016); 7-8/10 R knee, 5/10 L knee pain at most for the past 7 days (01/14/2017); 6-7/10 burning in L lower leg (04/12/17)    Time  6    Period  Weeks    Status  On-going      PT LONG TERM GOAL #6   Title  Pt will improve seated R hip flexion strength to at least 3+/5 to promote ability to ambulate and perform standing tasks.     Baseline  2+/5 seated R hip flexion (09/28/2016), (10/29/2016); (12/09/2016), (01/14/2017), (04/12/17)    Time  8    Period  Weeks    Status  On-going      PT LONG TERM GOAL #7   Title  Pt will improve her LEFS score to 40/80 or more as a demonstration of  improved function.     Baseline  31/80 (12/09/2016); 18/80, pt however states increased bilateral knee pain recently (01/14/2017); 20/80 (04/12/17)    Time  8    Period  Weeks    Status  On-going            Plan - 04/12/17 1332    Clinical Impression Statement  Pt presents after not being seen for ~1 month as pt reports she was unaware of appointments.  As expected after this long since therapy her LEFS score has not improved and pt continues to demonstrate R hip F weakness which is reflected in her gait mechanics.  Pt has partially achieved her goal of becoming independent with HEP and has been competing her HEP 3 days/wk.  Encouraged pt to complete HEP at least 5 days/wk moving forward.  Pt will benefit from continued skilled PT interventions for improved strength and gait mechanic.      Rehab Potential  Fair    Clinical Impairments Affecting Rehab Potential  Chronicity of condition, posible decreasing motivation (observed)    PT Frequency  2x / week    PT Duration  6 weeks    PT Treatment/Interventions  Electrical Stimulation;Aquatic Therapy;Ultrasound;Gait training;Functional mobility training;Therapeutic activities;Therapeutic exercise;Balance training;Neuromuscular re-education;Patient/family education;Manual techniques;Dry needling    PT Next Visit Plan  LE strengthening, modalities PRN    PT Home Exercise Plan  LAQ, SLR, marching in sitting and standing, hip IR with GTB, knee F with GTB    Consulted and Agree with Plan of Care  Patient       Patient will benefit from skilled therapeutic intervention in order to improve the following deficits and impairments:  Pain, Abnormal gait, Decreased balance, Decreased range of motion, Decreased strength, Difficulty walking  Visit Diagnosis: Muscle weakness (generalized)  Difficulty in walking, not elsewhere classified  Left knee pain, unspecified chronicity  Right knee pain, unspecified chronicity  Pain in left leg  Pain in right  leg     Problem List Patient Active Problem List   Diagnosis Date Noted  . Weakness 11/03/2012    Collie Siad PT, DPT 04/12/2017, 1:44 PM  [facility] [address] [place], [address] Phone: [phone]   Fax:  [phone]  Name: [name] MRN: [id number] Date of Birth: [birth year]

## 2017-04-15 ENCOUNTER — Encounter: Payer: Self-pay | Admitting: Physical Therapy

## 2017-04-15 ENCOUNTER — Ambulatory Visit: Payer: Medicare Other | Admitting: Physical Therapy

## 2017-04-15 ENCOUNTER — Ambulatory Visit: Payer: Medicare Other

## 2017-04-15 DIAGNOSIS — M25562 Pain in left knee: Secondary | ICD-10-CM | POA: Diagnosis not present

## 2017-04-15 DIAGNOSIS — R262 Difficulty in walking, not elsewhere classified: Secondary | ICD-10-CM | POA: Diagnosis not present

## 2017-04-15 DIAGNOSIS — M25561 Pain in right knee: Secondary | ICD-10-CM | POA: Diagnosis not present

## 2017-04-15 DIAGNOSIS — M6281 Muscle weakness (generalized): Secondary | ICD-10-CM | POA: Diagnosis not present

## 2017-04-15 DIAGNOSIS — M79605 Pain in left leg: Secondary | ICD-10-CM

## 2017-04-15 DIAGNOSIS — M79604 Pain in right leg: Secondary | ICD-10-CM

## 2017-04-15 NOTE — Therapy (Signed)
[facility] [address], [place], [address] Phone: [phone]   Fax:  [phone]  Physical Therapy Treatment  Patient Details  Name: [name] MRN: [id number] Date of Birth: [birth year] Referring Provider: Shary Decamp, [place]   Encounter Date: 04/15/2017  PT End of Session - 04/15/17 1303    Visit Number  34    Number of Visits  65    Date for PT Re-Evaluation  05/24/17    PT Start Time  1302    PT Stop Time  1345    PT Time Calculation (min)  43 min    Activity Tolerance  Patient tolerated treatment well    Behavior During Therapy  [facility] for tasks assessed/performed       Past Medical History:  Diagnosis Date  . Arthritis   . Depression     Past Surgical History:  Procedure Laterality Date  . ABDOMINAL HYSTERECTOMY  1995  . back sugery    . BUNIONECTOMY  2013   rt foot  . COLONOSCOPY    . MUSCLE BIOPSY Left 10/03/2012   Procedure: LEFT QUADRICEP MUSCLE BIOPSY;  Surgeon: Odis Hollingshead, MD;  Location: [place];  Service: General;  Laterality: Left;  . NECK SURGERY  2010   cerv disc fused     There were no vitals filed for this visit.  Subjective Assessment - 04/15/17 1305    Subjective  Pt says, "my right knee is giving me a fit today".  Pt believes she needs new sneakers, she is going to get new shoes today.      Pertinent History  LE weakness.  Symptoms occured suddenly, unknown method of injury prior to her first neck fusion surgery on January 2010. Had lower back surgery fusion in 2011.  The neck and back surgeries did not help. Pt states having increased urinary urgency and takes medication for for it. MD aware.  Denies saddle anesthesia.  Pt states that her doctor told her that PT is the only thing that is going to help her keep moving so she continues to participate in PT.  Last round of PT was last year which helped.  Currently has difficulty walking, performing  chores (wash dishes, laundry), cooking. Better able to do her tasks a little bit when she does therapy but gets harder when she stops.  Feels burning and stinging in both her knees, and bilateral anterior and lateral legs.  Pt states not having back or neck pain. Just a stiff neck.  Pt states she usually walks with a cane on her R side. No falls within the last 6 months.      Patient Stated Goals  Be better able to walk, and get around better without the stinging and burning in her knees and legs.     Currently in Pain?  Yes    Pain Score  7     Pain Location  Knee    Pain Orientation  Right    Pain Descriptors / Indicators  Aching    Pain Onset  More than a month ago    Multiple Pain Sites  No        TREATMENT   Manual therapy:  Supine IR R tibia grade 3. 3 bouts x30 seconds each. Pt says this feels good.  STM lateral R calf as pt demonstrates increased tension to this region   Therapeutic Exercise:  Supine BLE bridges 2x10 with cues for  glute squeeze throughout  Hooklying marching 2x10 each LE  R clamshells with cues to avoid compensatory rocking backward 2x10  Standing R hip F with 1UE support 2x10 RLE  Sit<>stand with mirror feedback with cues for even weight shift between BLEs. Pt favors RLE.   E-stim:  Russian stim to R VMO x10 minutes. 28 mA. 10 sec on/10 sec off. Pt hold LAQ in extension for 10 seconds on.                       PT Education - 04/15/17 1303    Education provided  Yes    Education Details  Exercise technique    Person(s) Educated  Patient    Methods  Explanation;Demonstration;Verbal cues    Comprehension  Verbalized understanding;Returned demonstration;Verbal cues required;Need further instruction          PT Long Term Goals - 04/12/17 1314      PT LONG TERM GOAL #1   Title  Pt will be independent with her HEP to promote LE strength in function.    Time  8    Period  Weeks    Status  On-going      PT LONG TERM GOAL #2    Title  Patient will improve bilateral LE strength by at least 1/2 MMT grade to promote ability to ambulate and perform functional tasks.     Time  8    Period  Weeks    Status  Achieved      PT LONG TERM GOAL #3   Title  Patient will improve her LEFS score by at least 9 points as a demonstration of improved function.     Baseline  14/80 (07/29/2016); 18/80 (10/29/2016); 31/80 (12/09/2016); 18/80, pt however states increased bilateral knee pain recently (01/14/2017); 20/80 (04/12/17)    Time  8    Period  Weeks    Status  On-going      PT LONG TERM GOAL #4   Title  Pt will report being able to walk/stand with SPC over 35 min to promote mobility.     Baseline  Pt states being able to walk for about 30 min (07/29/2016); about 1 hour per pt reports (09/07/2016)    Time  8    Period  Weeks    Status  Achieved      PT LONG TERM GOAL #5   Title  Patient will have a decrease in bilateral LE pain to 5/10 or less at worst to promote ability to perform functional tasks.     Baseline  8/10 at worst (07/29/2016); Minimal burning sensation bilateral knee and legs, no pain level number provided. Pt also states feeling pain in her knees during the weekend. No pain level provided (09/07/2016); 3/10 L leg burning sensation at most for the past 7 days (09/28/2016); bilateral knee burning sensation 8/10 at worst (10/29/2016); 7-8/10 bilateral knee pain, burning comes and goes (12/09/2016); 7-8/10 R knee, 5/10 L knee pain at most for the past 7 days (01/14/2017); 6-7/10 burning in L lower leg (04/12/17)    Time  6    Period  Weeks    Status  On-going      PT LONG TERM GOAL #6   Title  Pt will improve seated R hip flexion strength to at least 3+/5 to promote ability to ambulate and perform standing tasks.     Baseline  2+/5 seated R hip flexion (09/28/2016), (10/29/2016); (12/09/2016), (01/14/2017), (04/12/17)    Time  8    Period  Weeks    Status  On-going      PT LONG TERM GOAL #7   Title  Pt will improve her LEFS score to  40/80 or more as a demonstration of improved function.     Baseline  31/80 (12/09/2016); 18/80, pt however states increased bilateral knee pain recently (01/14/2017); 20/80 (04/12/17)    Time  8    Period  Weeks    Status  On-going            Plan - 04/15/17 1307    Clinical Impression Statement  Pt presents with R knee pain and swelling but tolerated all interventions well.  Discussed having half of her sessions in the pool for aquatic therapy and half on land.  Introduced russian stim to R VMO as pt demonstrates weakness which is translated into her gait mechanics.  Pt will benefit from continued skilled PT interventions for improved strength and gait mechanics.     Rehab Potential  Fair    Clinical Impairments Affecting Rehab Potential  Chronicity of condition, posible decreasing motivation (observed)    PT Frequency  2x / week    PT Duration  6 weeks    PT Treatment/Interventions  Electrical Stimulation;Aquatic Therapy;Ultrasound;Gait training;Functional mobility training;Therapeutic activities;Therapeutic exercise;Balance training;Neuromuscular re-education;Patient/family education;Manual techniques;Dry needling    PT Next Visit Plan  LE strengthening, modalities PRN    PT Home Exercise Plan  LAQ, SLR, marching in sitting and standing, hip IR with GTB, knee F with GTB    Consulted and Agree with Plan of Care  Patient       Patient will benefit from skilled therapeutic intervention in order to improve the following deficits and impairments:  Pain, Abnormal gait, Decreased balance, Decreased range of motion, Decreased strength, Difficulty walking  Visit Diagnosis: Muscle weakness (generalized)  Difficulty in walking, not elsewhere classified  Left knee pain, unspecified chronicity  Right knee pain, unspecified chronicity  Pain in left leg  Pain in right leg     Problem List Patient Active Problem List   Diagnosis Date Noted  . Weakness 11/03/2012    Collie Siad  PT, DPT 04/15/2017, 1:43 PM  [facility] [address] [place], [address] Phone: [phone]   Fax:  [phone]  Name: MISHAYLA SLIWINSKI MRN: [id number] Date of Birth: Mar 06, 1946

## 2017-04-19 ENCOUNTER — Ambulatory Visit: Payer: Medicare Other

## 2017-04-20 ENCOUNTER — Ambulatory Visit

## 2017-04-22 ENCOUNTER — Ambulatory Visit: Payer: Medicare Other

## 2017-04-22 DIAGNOSIS — R262 Difficulty in walking, not elsewhere classified: Secondary | ICD-10-CM | POA: Diagnosis not present

## 2017-04-22 DIAGNOSIS — M25562 Pain in left knee: Secondary | ICD-10-CM | POA: Diagnosis not present

## 2017-04-22 DIAGNOSIS — M6281 Muscle weakness (generalized): Secondary | ICD-10-CM | POA: Diagnosis not present

## 2017-04-22 DIAGNOSIS — M79605 Pain in left leg: Secondary | ICD-10-CM | POA: Diagnosis not present

## 2017-04-22 DIAGNOSIS — M25561 Pain in right knee: Secondary | ICD-10-CM | POA: Diagnosis not present

## 2017-04-22 DIAGNOSIS — M79604 Pain in right leg: Secondary | ICD-10-CM | POA: Diagnosis not present

## 2017-04-22 NOTE — Therapy (Signed)
[facility] [address], [place], [address] Phone: [phone]   Fax:  [phone]  Physical Therapy Treatment  Patient Details  Name: [name] MRN: [id number] Date of Birth: Apr 06, 1945 Referring Provider: Shary Decamp, [place]   Encounter Date: 04/22/2017  PT End of Session - 04/22/17 1318    Visit Number  35    Number of Visits  65    Date for PT Re-Evaluation  05/24/17    PT Start Time  1315    PT Stop Time  1345    PT Time Calculation (min)  30 min    Activity Tolerance  Patient tolerated treatment well    Behavior During Therapy  [facility] for tasks assessed/performed       Past Medical History:  Diagnosis Date  . Arthritis   . Depression     Past Surgical History:  Procedure Laterality Date  . ABDOMINAL HYSTERECTOMY  1995  . back sugery    . BUNIONECTOMY  2013   rt foot  . COLONOSCOPY    . MUSCLE BIOPSY Left 10/03/2012   Procedure: LEFT QUADRICEP MUSCLE BIOPSY;  Surgeon: Odis Hollingshead, MD;  Location: [place];  Service: General;  Laterality: Left;  . NECK SURGERY  2010   cerv disc fused     There were no vitals filed for this visit.  Subjective Assessment - 04/22/17 1317    Subjective  Pt arrives late to her appointment complainign of R knee pain. She is unsure about doing aquatic therapy. She reports no improvement in her RLE strength. She notices intermittent swelling of her R knee. No specific question at this time.     Pertinent History  LE weakness.  Symptoms occured suddenly, unknown method of injury prior to her first neck fusion surgery on January 2010. Had lower back surgery fusion in 2011.  The neck and back surgeries did not help. Pt states having increased urinary urgency and takes medication for for it. MD aware.  Denies saddle anesthesia.  Pt states that her doctor told her that PT is the only thing that is going to help her keep moving so she continues to  participate in PT.  Last round of PT was last year which helped.  Currently has difficulty walking, performing chores (wash dishes, laundry), cooking. Better able to do her tasks a little bit when she does therapy but gets harder when she stops.  Feels burning and stinging in both her knees, and bilateral anterior and lateral legs.  Pt states not having back or neck pain. Just a stiff neck.  Pt states she usually walks with a cane on her R side. No falls within the last 6 months.      Patient Stated Goals  Be better able to walk, and get around better without the stinging and burning in her knees and legs.     Currently in Pain?  Yes    Pain Score  5     Pain Location  Knee    Pain Orientation  Right    Pain Descriptors / Indicators  Aching    Pain Type  Chronic pain    Pain Onset  More than a month ago    Pain Frequency  Intermittent             TREATMENT   Ther-ex  Hooklying marching 2 x 10 each LE; Supine BLE bridges 2 x 10 with cues for glute  squeeze throughout  Hooklying clamshells with manual resistance 2 x 10;  Hooklying adductor balls squeeze 2 x 10; Attempted sit to stand from elevated mat table but pt unable to perform due to increase in R knee pain; Seated LAQ 2 x 10 with gentle manual resistance; Seated HS curls with manual resistance 2 x 10; Seated heel raises 2 x 10 with manual resistance; Supine active R hip abduction with gentle manual resistance 2 x 10;                    PT Education - 04/22/17 1318    Education provided  Yes    Education Details  Exercise form/technique    Person(s) Educated  Patient    Methods  Explanation    Comprehension  Verbalized understanding          PT Long Term Goals - 04/12/17 1314      PT LONG TERM GOAL #1   Title  Pt will be independent with her HEP to promote LE strength in function.    Time  8    Period  Weeks    Status  On-going      PT LONG TERM GOAL #2   Title  Patient will improve bilateral  LE strength by at least 1/2 MMT grade to promote ability to ambulate and perform functional tasks.     Time  8    Period  Weeks    Status  Achieved      PT LONG TERM GOAL #3   Title  Patient will improve her LEFS score by at least 9 points as a demonstration of improved function.     Baseline  14/80 (07/29/2016); 18/80 (10/29/2016); 31/80 (12/09/2016); 18/80, pt however states increased bilateral knee pain recently (01/14/2017); 20/80 (04/12/17)    Time  8    Period  Weeks    Status  On-going      PT LONG TERM GOAL #4   Title  Pt will report being able to walk/stand with SPC over 35 min to promote mobility.     Baseline  Pt states being able to walk for about 30 min (07/29/2016); about 1 hour per pt reports (09/07/2016)    Time  8    Period  Weeks    Status  Achieved      PT LONG TERM GOAL #5   Title  Patient will have a decrease in bilateral LE pain to 5/10 or less at worst to promote ability to perform functional tasks.     Baseline  8/10 at worst (07/29/2016); Minimal burning sensation bilateral knee and legs, no pain level number provided. Pt also states feeling pain in her knees during the weekend. No pain level provided (09/07/2016); 3/10 L leg burning sensation at most for the past 7 days (09/28/2016); bilateral knee burning sensation 8/10 at worst (10/29/2016); 7-8/10 bilateral knee pain, burning comes and goes (12/09/2016); 7-8/10 R knee, 5/10 L knee pain at most for the past 7 days (01/14/2017); 6-7/10 burning in L lower leg (04/12/17)    Time  6    Period  Weeks    Status  On-going      PT LONG TERM GOAL #6   Title  Pt will improve seated R hip flexion strength to at least 3+/5 to promote ability to ambulate and perform standing tasks.     Baseline  2+/5 seated R hip flexion (09/28/2016), (10/29/2016); (12/09/2016), (01/14/2017), (04/12/17)    Time  8  Period  Weeks    Status  On-going      PT LONG TERM GOAL #7   Title  Pt will improve her LEFS score to 40/80 or more as a demonstration of  improved function.     Baseline  31/80 (12/09/2016); 18/80, pt however states increased bilateral knee pain recently (01/14/2017); 20/80 (04/12/17)    Time  8    Period  Weeks    Status  On-going            Plan - 04/22/17 1318    Clinical Impression Statement  Pt arrived significantly late for her appointment so session was abbreviated accordingly. Pt demonstrates considerable RLE weakness. She reports that she has not seen any improvement in her strength since starting therapy. Pt provided intermittent rest breaks during session. Encouragement provided regarding benefit of aquatic therapy. Supportive Encouraged pt to continue HEP and follow-up as scheduled.     Rehab Potential  Fair    Clinical Impairments Affecting Rehab Potential  Chronicity of condition, posible decreasing motivation (observed)    PT Frequency  2x / week    PT Duration  6 weeks    PT Treatment/Interventions  Electrical Stimulation;Aquatic Therapy;Ultrasound;Gait training;Functional mobility training;Therapeutic activities;Therapeutic exercise;Balance training;Neuromuscular re-education;Patient/family education;Manual techniques;Dry needling    PT Next Visit Plan  LE strengthening, modalities PRN    PT Home Exercise Plan  LAQ, SLR, marching in sitting and standing, hip IR with GTB, knee F with GTB    Consulted and Agree with Plan of Care  Patient       Patient will benefit from skilled therapeutic intervention in order to improve the following deficits and impairments:  Pain, Abnormal gait, Decreased balance, Decreased range of motion, Decreased strength, Difficulty walking  Visit Diagnosis: Muscle weakness (generalized)  Difficulty in walking, not elsewhere classified     Problem List Patient Active Problem List   Diagnosis Date Noted  . Weakness 11/03/2012   Phillips Grout PT, DPT   Gentri Guardado 04/22/2017, 3:12 PM  [facility] [address], [place], [address] Phone: [phone]   Fax:  [phone]  Name: [name] MRN: [id number] Date of Birth: [birth year]

## 2017-04-27 ENCOUNTER — Encounter

## 2017-04-27 ENCOUNTER — Ambulatory Visit: Payer: Medicare Other

## 2017-04-28 DIAGNOSIS — M1180 Other specified crystal arthropathies, unspecified site: Secondary | ICD-10-CM | POA: Diagnosis not present

## 2017-04-28 DIAGNOSIS — M25562 Pain in left knee: Secondary | ICD-10-CM | POA: Diagnosis not present

## 2017-04-28 DIAGNOSIS — M170 Bilateral primary osteoarthritis of knee: Secondary | ICD-10-CM | POA: Diagnosis not present

## 2017-04-28 DIAGNOSIS — M25461 Effusion, right knee: Secondary | ICD-10-CM | POA: Diagnosis not present

## 2017-04-28 DIAGNOSIS — M25561 Pain in right knee: Secondary | ICD-10-CM | POA: Diagnosis not present

## 2017-04-29 ENCOUNTER — Ambulatory Visit: Payer: Medicare Other | Attending: Physician Assistant | Admitting: Physical Therapy

## 2017-04-29 ENCOUNTER — Encounter: Payer: Self-pay | Admitting: Physical Therapy

## 2017-04-29 DIAGNOSIS — M79605 Pain in left leg: Secondary | ICD-10-CM | POA: Diagnosis not present

## 2017-04-29 DIAGNOSIS — M25562 Pain in left knee: Secondary | ICD-10-CM

## 2017-04-29 DIAGNOSIS — R262 Difficulty in walking, not elsewhere classified: Secondary | ICD-10-CM | POA: Insufficient documentation

## 2017-04-29 DIAGNOSIS — M79604 Pain in right leg: Secondary | ICD-10-CM | POA: Diagnosis not present

## 2017-04-29 DIAGNOSIS — M25561 Pain in right knee: Secondary | ICD-10-CM | POA: Insufficient documentation

## 2017-04-29 DIAGNOSIS — M6281 Muscle weakness (generalized): Secondary | ICD-10-CM | POA: Diagnosis not present

## 2017-04-29 NOTE — Therapy (Signed)
[facility] [address], [place], [address] Phone: [phone]   Fax:  [phone]  Physical Therapy Treatment  Patient Details  Name: [name] MRN: [id number] Date of Birth: Mar 02, 1946 Referring Provider: Shary Decamp, [place]   Encounter Date: 04/29/2017  PT End of Session - 04/29/17 1348    Visit Number  36    Number of Visits  65    Date for PT Re-Evaluation  05/24/17    PT Start Time  1347    PT Stop Time  1428    PT Time Calculation (min)  41 min    Activity Tolerance  Patient tolerated treatment well    Behavior During Therapy  [facility] for tasks assessed/performed       Past Medical History:  Diagnosis Date  . Arthritis   . Depression     Past Surgical History:  Procedure Laterality Date  . ABDOMINAL HYSTERECTOMY  1995  . back sugery    . BUNIONECTOMY  2013   rt foot  . COLONOSCOPY    . MUSCLE BIOPSY Left 10/03/2012   Procedure: LEFT QUADRICEP MUSCLE BIOPSY;  Surgeon: Odis Hollingshead, MD;  Location: [place];  Service: General;  Laterality: Left;  . NECK SURGERY  2010   cerv disc fused     There were no vitals filed for this visit.  Subjective Assessment - 04/29/17 1350    Subjective  Pt reports her insurance will not cover aquatic therapy so she would like to do all of her appointments on land.  Pt had fluid drawn from R knee with supsicion of gout.  Pt also had injection to R knee.  Pt got new tennis shoes.      Pertinent History  LE weakness.  Symptoms occured suddenly, unknown method of injury prior to her first neck fusion surgery on January 2010. Had lower back surgery fusion in 2011.  The neck and back surgeries did not help. Pt states having increased urinary urgency and takes medication for for it. MD aware.  Denies saddle anesthesia.  Pt states that her doctor told her that PT is the only thing that is going to help her keep moving so she continues to  participate in PT.  Last round of PT was last year which helped.  Currently has difficulty walking, performing chores (wash dishes, laundry), cooking. Better able to do her tasks a little bit when she does therapy but gets harder when she stops.  Feels burning and stinging in both her knees, and bilateral anterior and lateral legs.  Pt states not having back or neck pain. Just a stiff neck.  Pt states she usually walks with a cane on her R side. No falls within the last 6 months.      Patient Stated Goals  Be better able to walk, and get around better without the stinging and burning in her knees and legs.     Currently in Pain?  Yes    Pain Score  5     Pain Location  Knee    Pain Orientation  Left    Pain Descriptors / Indicators  Burning    Pain Onset  More than a month ago    Multiple Pain Sites  No        TREATMENT   Therapeutic Exercise:   Supine BLE bridges 2x10 with cues for glute squeeze throughout   Hooklying Bil hip Abd/ER with GTB 2x15  Hooklying marching 2x10 each LE   R clamshells with GTB with cues to avoid compensatory rocking backward 3x10   Standing R hip F with 1UE support 2x10 RLE   Sit<>stand with mirror feedback with cues for even weight shift between BLEs. Pt favors RLE. X10.  Cues and demonstration to decrease knee valgus Bil.   Stepping with LLE and accepting weight into L SLS x15 with 1UE supported x10   Stepping with RLE and accepting weight into R SLS x15 with 1UE supported x10. Cues for upright posture.   Recumbent bike for 3 minutes with cues to push and pull using BLEs. Pt demonstrates little to no R hip ER control with R hip collapsing into IR. Discontinued.   Mini squats with BUEs supported 3x10. Cues for greater hip hinge as pt reported tightness in R knee. This tightness was eliminated with cues for hip hinge.   Seated R LAQ with 2# ankle weights 3x15                       PT Education - 04/29/17 1348    Education  provided  Yes    Education Details  Exercise technique    Person(s) Educated  Patient    Methods  Explanation;Demonstration;Verbal cues    Comprehension  Verbalized understanding;Returned demonstration;Verbal cues required;Need further instruction          PT Long Term Goals - 04/12/17 1314      PT LONG TERM GOAL #1   Title  Pt will be independent with her HEP to promote LE strength in function.    Time  8    Period  Weeks    Status  On-going      PT LONG TERM GOAL #2   Title  Patient will improve bilateral LE strength by at least 1/2 MMT grade to promote ability to ambulate and perform functional tasks.     Time  8    Period  Weeks    Status  Achieved      PT LONG TERM GOAL #3   Title  Patient will improve her LEFS score by at least 9 points as a demonstration of improved function.     Baseline  14/80 (07/29/2016); 18/80 (10/29/2016); 31/80 (12/09/2016); 18/80, pt however states increased bilateral knee pain recently (01/14/2017); 20/80 (04/12/17)    Time  8    Period  Weeks    Status  On-going      PT LONG TERM GOAL #4   Title  Pt will report being able to walk/stand with SPC over 35 min to promote mobility.     Baseline  Pt states being able to walk for about 30 min (07/29/2016); about 1 hour per pt reports (09/07/2016)    Time  8    Period  Weeks    Status  Achieved      PT LONG TERM GOAL #5   Title  Patient will have a decrease in bilateral LE pain to 5/10 or less at worst to promote ability to perform functional tasks.     Baseline  8/10 at worst (07/29/2016); Minimal burning sensation bilateral knee and legs, no pain level number provided. Pt also states feeling pain in her knees during the weekend. No pain level provided (09/07/2016); 3/10 L leg burning sensation at most for the past 7 days (09/28/2016); bilateral knee burning sensation 8/10 at worst (10/29/2016); 7-8/10 bilateral knee pain, burning comes and goes (12/09/2016); 7-8/10 R knee, 5/10 L  knee pain at most for the past 7  days (01/14/2017); 6-7/10 burning in L lower leg (04/12/17)    Time  6    Period  Weeks    Status  On-going      PT LONG TERM GOAL #6   Title  Pt will improve seated R hip flexion strength to at least 3+/5 to promote ability to ambulate and perform standing tasks.     Baseline  2+/5 seated R hip flexion (09/28/2016), (10/29/2016); (12/09/2016), (01/14/2017), (04/12/17)    Time  8    Period  Weeks    Status  On-going      PT LONG TERM GOAL #7   Title  Pt will improve her LEFS score to 40/80 or more as a demonstration of improved function.     Baseline  31/80 (12/09/2016); 18/80, pt however states increased bilateral knee pain recently (01/14/2017); 20/80 (04/12/17)    Time  8    Period  Weeks    Status  On-going            Plan - 04/29/17 1352    Clinical Impression Statement  Pt presents painfree in R knee following injection.  Pt perfromed BLE strengthening in both supine and standing which she tolerated well.  Introduced Hotel manager combined with gait training this session.  Pt trialed the recumbent bike as she would like to return to doing this at her gym, cues provided to perform both push and pull but this machine not currently appropriate for the pt as her R hip falls into IR.  Pt will benefit from continued skilled PT interventions for improved strength and gait mechanics.      Rehab Potential  Fair    Clinical Impairments Affecting Rehab Potential  Chronicity of condition, posible decreasing motivation (observed)    PT Frequency  2x / week    PT Duration  6 weeks    PT Treatment/Interventions  Electrical Stimulation;Aquatic Therapy;Ultrasound;Gait training;Functional mobility training;Therapeutic activities;Therapeutic exercise;Balance training;Neuromuscular re-education;Patient/family education;Manual techniques;Dry needling    PT Next Visit Plan  LE strengthening, modalities PRN    PT Home Exercise Plan  LAQ, SLR, marching in sitting and standing, hip IR with GTB, knee F with  GTB    Consulted and Agree with Plan of Care  Patient       Patient will benefit from skilled therapeutic intervention in order to improve the following deficits and impairments:  Pain, Abnormal gait, Decreased balance, Decreased range of motion, Decreased strength, Difficulty walking  Visit Diagnosis: Muscle weakness (generalized)  Difficulty in walking, not elsewhere classified  Left knee pain, unspecified chronicity  Right knee pain, unspecified chronicity     Problem List Patient Active Problem List   Diagnosis Date Noted  . Weakness 11/03/2012    Collie Siad PT, DPT 04/29/2017, 2:30 PM  [facility] [address], [place], [address] Phone: [phone]   Fax:  [phone]  Name: ILYNN STAUFFER MRN: [id number] Date of Birth: Jan 18, 1946

## 2017-05-03 ENCOUNTER — Encounter

## 2017-05-04 ENCOUNTER — Ambulatory Visit

## 2017-05-05 ENCOUNTER — Ambulatory Visit: Payer: Medicare Other

## 2017-05-05 DIAGNOSIS — H40023 Open angle with borderline findings, high risk, bilateral: Secondary | ICD-10-CM | POA: Diagnosis not present

## 2017-05-05 DIAGNOSIS — H2513 Age-related nuclear cataract, bilateral: Secondary | ICD-10-CM | POA: Diagnosis not present

## 2017-05-05 DIAGNOSIS — H04123 Dry eye syndrome of bilateral lacrimal glands: Secondary | ICD-10-CM | POA: Diagnosis not present

## 2017-05-05 DIAGNOSIS — H25013 Cortical age-related cataract, bilateral: Secondary | ICD-10-CM | POA: Diagnosis not present

## 2017-05-06 ENCOUNTER — Telehealth: Payer: Self-pay

## 2017-05-06 NOTE — Telephone Encounter (Signed)
No show past 2 sessions. Called patient to check up on her. Pt states that she is used to going to PT Tuesdays and Thursdays. She called earlier to change the sessions to those days. Will be able to make it to her next follow up session.

## 2017-05-10 ENCOUNTER — Encounter

## 2017-05-10 DIAGNOSIS — M25561 Pain in right knee: Secondary | ICD-10-CM | POA: Diagnosis not present

## 2017-05-10 DIAGNOSIS — M170 Bilateral primary osteoarthritis of knee: Secondary | ICD-10-CM | POA: Diagnosis not present

## 2017-05-11 ENCOUNTER — Ambulatory Visit: Payer: Medicare Other

## 2017-05-11 ENCOUNTER — Ambulatory Visit

## 2017-05-12 ENCOUNTER — Encounter

## 2017-05-12 DIAGNOSIS — M19041 Primary osteoarthritis, right hand: Secondary | ICD-10-CM | POA: Diagnosis not present

## 2017-05-12 DIAGNOSIS — M170 Bilateral primary osteoarthritis of knee: Secondary | ICD-10-CM | POA: Diagnosis not present

## 2017-05-12 DIAGNOSIS — M1180 Other specified crystal arthropathies, unspecified site: Secondary | ICD-10-CM | POA: Diagnosis not present

## 2017-05-12 DIAGNOSIS — M25561 Pain in right knee: Secondary | ICD-10-CM | POA: Diagnosis not present

## 2017-05-12 DIAGNOSIS — M25562 Pain in left knee: Secondary | ICD-10-CM | POA: Diagnosis not present

## 2017-05-12 DIAGNOSIS — M79643 Pain in unspecified hand: Secondary | ICD-10-CM | POA: Diagnosis not present

## 2017-05-12 DIAGNOSIS — R768 Other specified abnormal immunological findings in serum: Secondary | ICD-10-CM | POA: Diagnosis not present

## 2017-05-12 DIAGNOSIS — M79641 Pain in right hand: Secondary | ICD-10-CM | POA: Diagnosis not present

## 2017-05-12 DIAGNOSIS — M25461 Effusion, right knee: Secondary | ICD-10-CM | POA: Diagnosis not present

## 2017-05-12 DIAGNOSIS — M19042 Primary osteoarthritis, left hand: Secondary | ICD-10-CM | POA: Diagnosis not present

## 2017-05-12 DIAGNOSIS — M79642 Pain in left hand: Secondary | ICD-10-CM | POA: Diagnosis not present

## 2017-05-13 ENCOUNTER — Ambulatory Visit: Payer: Medicare Other

## 2017-05-13 DIAGNOSIS — M6281 Muscle weakness (generalized): Secondary | ICD-10-CM | POA: Diagnosis not present

## 2017-05-13 DIAGNOSIS — R262 Difficulty in walking, not elsewhere classified: Secondary | ICD-10-CM | POA: Diagnosis not present

## 2017-05-13 DIAGNOSIS — M25561 Pain in right knee: Secondary | ICD-10-CM | POA: Diagnosis not present

## 2017-05-13 DIAGNOSIS — M79605 Pain in left leg: Secondary | ICD-10-CM | POA: Diagnosis not present

## 2017-05-13 DIAGNOSIS — M25562 Pain in left knee: Secondary | ICD-10-CM | POA: Diagnosis not present

## 2017-05-13 DIAGNOSIS — M79604 Pain in right leg: Secondary | ICD-10-CM | POA: Diagnosis not present

## 2017-05-13 NOTE — Patient Instructions (Addendum)
Gave supine clamshells green band 15x2 daily as part of her HEP. Pt demonstrated and verbalized understanding. Pt said that she does not need a paper handout/instructions.

## 2017-05-13 NOTE — Therapy (Signed)
[facility] [address], [place], [address] Phone: [phone]   Fax:  [phone]  Physical Therapy Treatment  Patient Details  Name: [name] MRN: [id number] Date of Birth: March 13, 1946 Referring Provider: Shary Decamp, [place]   Encounter Date: 05/13/2017  PT End of Session - 05/13/17 1438    Visit Number  37    Number of Visits  65    Date for PT Re-Evaluation  05/24/17    PT Start Time  1438    PT Stop Time  1517    PT Time Calculation (min)  39 min    Activity Tolerance  Patient tolerated treatment well    Behavior During Therapy  [facility] for tasks assessed/performed       Past Medical History:  Diagnosis Date  . Arthritis   . Depression     Past Surgical History:  Procedure Laterality Date  . ABDOMINAL HYSTERECTOMY  1995  . back sugery    . BUNIONECTOMY  2013   rt foot  . COLONOSCOPY    . MUSCLE BIOPSY Left 10/03/2012   Procedure: LEFT QUADRICEP MUSCLE BIOPSY;  Surgeon: Odis Hollingshead, MD;  Location: [facility];  Service: General;  Laterality: Left;  . NECK SURGERY  2010   cerv disc fused     There were no vitals filed for this visit.  Subjective Assessment - 05/13/17 1441    Subjective  R knee is better. L knee feels like it was getting too much pressure. Not hurting but feels burning sensation L knee. Pt states that her insurance called her back and said that they would cover her aqua therapy. Pt however does not feel like it anymore.  Might try it again though. Pt states that she has been tripping more for the past couple of weeks. Has not been using her AFO.  Also getting depressed on how long she has had her condition.     Pertinent History  LE weakness.  Symptoms occured suddenly, unknown method of injury prior to her first neck fusion surgery on January 2010. Had lower back surgery fusion in 2011.  The neck and back surgeries did not help. Pt states having increased  urinary urgency and takes medication for for it. MD aware.  Denies saddle anesthesia.  Pt states that her doctor told her that PT is the only thing that is going to help her keep moving so she continues to participate in PT.  Last round of PT was last year which helped.  Currently has difficulty walking, performing chores (wash dishes, laundry), cooking. Better able to do her tasks a little bit when she does therapy but gets harder when she stops.  Feels burning and stinging in both her knees, and bilateral anterior and lateral legs.  Pt states not having back or neck pain. Just a stiff neck.  Pt states she usually walks with a cane on her R side. No falls within the last 6 months.      Patient Stated Goals  Be better able to walk, and get around better without the stinging and burning in her knees and legs.     Currently in Pain?  No/denies    Pain Onset  More than a month ago                              PT Education - 05/13/17 1443  Education provided  Yes    Education Details  ther-ex, HEP    Person(s) Educated  Patient    Methods  Explanation;Demonstration;Tactile cues;Verbal cues    Comprehension  Returned demonstration;Verbalized understanding        Objectives  Pt states that she is getting a nerve study test.    Therapeutic Exercise:   Supine BLE bridges 2x10 with cues for glute squeeze throughout   Hooklying Bil hip Abd/ER with GTB 3x15   Pt was recommended to use her AFO again to help decrease her chances of tripping. Pt verbalized understanding.   Hooklying marching 2x10 each LE. Difficulty with R hip flexion. PT assist  Seated assisted R hip flexion with holds throughout range, PT assist 5x3    Then without assist 3x. 2+/5 R hip flexion strength observed  Seated R ankle DF 10x5 seconds to promote foot clearance.    Reviewed plan of care: 3 weeks (2 aquatherapy/week ) then 1 week (2 visits) land, then 3 weeks (2 aquatherapy per week) , then  1 week land for next plan of care (8 weeks total). Pt said that she's going to check out where the aqua therapy place is at the [facility] of [place] place.    Improved exercise technique, movement at target joints, use of target muscles after min to mod verbal, visual, tactile cues.     Continued working on glute med and max strengthening to promote femoral control with sit <> stand transfers to help with knee pain. Also continued working on R hip flexor and R tibialis muscle strengthening to promote foot clearance and decrease fall risk. Pt was recommended to continue using her AFO for safety (especially if her muscle is fatigued) to help her not trip. Pt verbalized understanding. Still demonstrates R hip flexor weakness but slight improvement observed after exercise. Good R tibailis muscle contraction observed and palpated with R ankle DF exercise. Pt will benefit from continued skilled physical therapy services to promote LE strength to improve ability to ambulate and perform standing tasks.       PT Long Term Goals - 04/12/17 1314      PT LONG TERM GOAL #1   Title  Pt will be independent with her HEP to promote LE strength in function.    Time  8    Period  Weeks    Status  On-going      PT LONG TERM GOAL #2   Title  Patient will improve bilateral LE strength by at least 1/2 MMT grade to promote ability to ambulate and perform functional tasks.     Time  8    Period  Weeks    Status  Achieved      PT LONG TERM GOAL #3   Title  Patient will improve her LEFS score by at least 9 points as a demonstration of improved function.     Baseline  14/80 (07/29/2016); 18/80 (10/29/2016); 31/80 (12/09/2016); 18/80, pt however states increased bilateral knee pain recently (01/14/2017); 20/80 (04/12/17)    Time  8    Period  Weeks    Status  On-going      PT LONG TERM GOAL #4   Title  Pt will report being able to walk/stand with SPC over 35 min to promote mobility.     Baseline  Pt states  being able to walk for about 30 min (07/29/2016); about 1 hour per pt reports (09/07/2016)    Time  8    Period  Weeks  Status  Achieved      PT LONG TERM GOAL #5   Title  Patient will have a decrease in bilateral LE pain to 5/10 or less at worst to promote ability to perform functional tasks.     Baseline  8/10 at worst (07/29/2016); Minimal burning sensation bilateral knee and legs, no pain level number provided. Pt also states feeling pain in her knees during the weekend. No pain level provided (09/07/2016); 3/10 L leg burning sensation at most for the past 7 days (09/28/2016); bilateral knee burning sensation 8/10 at worst (10/29/2016); 7-8/10 bilateral knee pain, burning comes and goes (12/09/2016); 7-8/10 R knee, 5/10 L knee pain at most for the past 7 days (01/14/2017); 6-7/10 burning in L lower leg (04/12/17)    Time  6    Period  Weeks    Status  On-going      PT LONG TERM GOAL #6   Title  Pt will improve seated R hip flexion strength to at least 3+/5 to promote ability to ambulate and perform standing tasks.     Baseline  2+/5 seated R hip flexion (09/28/2016), (10/29/2016); (12/09/2016), (01/14/2017), (04/12/17)    Time  8    Period  Weeks    Status  On-going      PT LONG TERM GOAL #7   Title  Pt will improve her LEFS score to 40/80 or more as a demonstration of improved function.     Baseline  31/80 (12/09/2016); 18/80, pt however states increased bilateral knee pain recently (01/14/2017); 20/80 (04/12/17)    Time  8    Period  Weeks    Status  On-going            Plan - 05/13/17 1450    Clinical Impression Statement  Continued working on glute med and max strengthening to promote femoral control with sit <> stand transfers to help with knee pain. Also continued working on R hip flexor and R tibialis muscle strengthening to promote foot clearance and decrease fall risk. Pt was recommended to continue using her AFO for safety (especially if her muscle is fatigued) to help her not trip. Pt  verbalized understanding. Still demonstrates R hip flexor weakness but slight improvement observed after exercise. Good R tibailis muscle contraction observed and palpated with R ankle DF exercise. Pt will benefit from continued skilled physical therapy services to promote LE strength to improve ability to ambulate and perform standing tasks.     Rehab Potential  Fair    Clinical Impairments Affecting Rehab Potential  Chronicity of condition, posible decreasing motivation (observed)    PT Frequency  2x / week    PT Duration  6 weeks    PT Treatment/Interventions  Electrical Stimulation;Aquatic Therapy;Ultrasound;Gait training;Functional mobility training;Therapeutic activities;Therapeutic exercise;Balance training;Neuromuscular re-education;Patient/family education;Manual techniques;Dry needling    PT Next Visit Plan  LE strengthening, modalities PRN    PT Home Exercise Plan  LAQ, SLR, marching in sitting and standing, hip IR with GTB, knee F with GTB    Consulted and Agree with Plan of Care  Patient       Patient will benefit from skilled therapeutic intervention in order to improve the following deficits and impairments:  Pain, Abnormal gait, Decreased balance, Decreased range of motion, Decreased strength, Difficulty walking  Visit Diagnosis: Muscle weakness (generalized)  Difficulty in walking, not elsewhere classified     Problem List Patient Active Problem List   Diagnosis Date Noted  . Weakness 11/03/2012    [name]  PT, DPT   05/13/2017, 4:32 PM  [facility] [address] [place], [address] Phone: [phone]   Fax:  [phone]  Name: [name] MRN: [id number] Date of Birth: Oct 21, 1945

## 2017-05-17 ENCOUNTER — Encounter

## 2017-05-18 ENCOUNTER — Ambulatory Visit: Payer: Medicare Other

## 2017-05-18 DIAGNOSIS — M79604 Pain in right leg: Secondary | ICD-10-CM | POA: Diagnosis not present

## 2017-05-18 DIAGNOSIS — R262 Difficulty in walking, not elsewhere classified: Secondary | ICD-10-CM

## 2017-05-18 DIAGNOSIS — M25562 Pain in left knee: Secondary | ICD-10-CM | POA: Diagnosis not present

## 2017-05-18 DIAGNOSIS — M79605 Pain in left leg: Secondary | ICD-10-CM | POA: Diagnosis not present

## 2017-05-18 DIAGNOSIS — M6281 Muscle weakness (generalized): Secondary | ICD-10-CM | POA: Diagnosis not present

## 2017-05-18 DIAGNOSIS — M25561 Pain in right knee: Secondary | ICD-10-CM | POA: Diagnosis not present

## 2017-05-18 NOTE — Therapy (Signed)
[facility] [address], [place], [address] Phone: [phone]   Fax:  [phone]  Physical Therapy Treatment  Patient Details  Name: [name] MRN: [id number] Date of Birth: 26-Jun-1945 Referring Provider: Shary Decamp, [place]   Encounter Date: 05/18/2017  PT End of Session - 05/18/17 1437    Visit Number  38    Number of Visits  65    Date for PT Re-Evaluation  05/24/17    PT Start Time  3244    PT Stop Time  1518    PT Time Calculation (min)  41 min    Activity Tolerance  Patient tolerated treatment well    Behavior During Therapy  [facility] for tasks assessed/performed       Past Medical History:  Diagnosis Date  . Arthritis   . Depression     Past Surgical History:  Procedure Laterality Date  . ABDOMINAL HYSTERECTOMY  1995  . back sugery    . BUNIONECTOMY  2013   rt foot  . COLONOSCOPY    . MUSCLE BIOPSY Left 10/03/2012   Procedure: LEFT QUADRICEP MUSCLE BIOPSY;  Surgeon: Odis Hollingshead, MD;  Location: [facility];  Service: General;  Laterality: Left;  . NECK SURGERY  2010   cerv disc fused     There were no vitals filed for this visit.  Subjective Assessment - 05/18/17 1441    Subjective  Knees are actually pretty good. Just a slight tingling left leg (L lateral tibia). 1-2/10 bilateral knee and leg pain at most for the past 7 days.     Pertinent History  LE weakness.  Symptoms occured suddenly, unknown method of injury prior to her first neck fusion surgery on January 2010. Had lower back surgery fusion in 2011.  The neck and back surgeries did not help. Pt states having increased urinary urgency and takes medication for for it. MD aware.  Denies saddle anesthesia.  Pt states that her doctor told her that PT is the only thing that is going to help her keep moving so she continues to participate in PT.  Last round of PT was last year which helped.  Currently has difficulty  walking, performing chores (wash dishes, laundry), cooking. Better able to do her tasks a little bit when she does therapy but gets harder when she stops.  Feels burning and stinging in both her knees, and bilateral anterior and lateral legs.  Pt states not having back or neck pain. Just a stiff neck.  Pt states she usually walks with a cane on her R side. No falls within the last 6 months.      Patient Stated Goals  Be better able to walk, and get around better without the stinging and burning in her knees and legs.     Currently in Pain?  Other (Comment)    Pain Score  0-No pain no pain level provided    Pain Onset  More than a month ago                              PT Education - 05/18/17 1448    Education provided  Yes    Education Details  ther-ex    [employer]) Educated  Patient    Methods  Explanation;Tactile cues;Demonstration;Verbal cues    Comprehension  Returned demonstration;Verbalized understanding       Objectives  Pt  wearing her AFO R foot. Has not had tripping or loss of balance since wearing her AFO.   Therapeutic exercise  Proper stand to sit onto mat table for proper thigh and leg positioning to help decrease overall knee pain and leg burning sensation.   Sitting on dyna disc  upright posture with manual perturbation from PT 1 min x 3    Seated assisted R hip flexion with holds throughout the range 10x2 with PT assist   Hooklying marching 2x10 each LE. Difficulty with R hip flexion. PT assist  Standing R hip flexion onto treadmill platform with bilateral UE assist 10x each LE, emphasis on R hip flexion.   Seated manually resisted R hip extension 10x to promote glute max strengthening and quad strengthening.   Stand to sit onto Nustep chair, emphasis on femoral control 10x, then 5x with bilateral UE assist. Performed secondary to consistent decrease femoral control observed when performing transfers. Able to perform, cues needed.   Reviewed  plan of care: 8 weeks (3 weeks of aqua therapy, 1 week land, 3 weeks aqua therapy, 1 week land)   Improved exercise technique, movement at target joints, use of target muscles after min to mod verbal, visual, tactile cues.    Pt states feeling like she is walking pretty good after session. Continued worlking on trunk and hip flexor strengtthening as well as with glute and quad strengthening to promote abiity to perform functional tasks such as gait and to promote foot clearance.         PT Long Term Goals - 04/12/17 1314      PT LONG TERM GOAL #1   Title  Pt will be independent with her HEP to promote LE strength in function.    Time  8    Period  Weeks    Status  On-going      PT LONG TERM GOAL #2   Title  Patient will improve bilateral LE strength by at least 1/2 MMT grade to promote ability to ambulate and perform functional tasks.     Time  8    Period  Weeks    Status  Achieved      PT LONG TERM GOAL #3   Title  Patient will improve her LEFS score by at least 9 points as a demonstration of improved function.     Baseline  14/80 (07/29/2016); 18/80 (10/29/2016); 31/80 (12/09/2016); 18/80, pt however states increased bilateral knee pain recently (01/14/2017); 20/80 (04/12/17)    Time  8    Period  Weeks    Status  On-going      PT LONG TERM GOAL #4   Title  Pt will report being able to walk/stand with SPC over 35 min to promote mobility.     Baseline  Pt states being able to walk for about 30 min (07/29/2016); about 1 hour per pt reports (09/07/2016)    Time  8    Period  Weeks    Status  Achieved      PT LONG TERM GOAL #5   Title  Patient will have a decrease in bilateral LE pain to 5/10 or less at worst to promote ability to perform functional tasks.     Baseline  8/10 at worst (07/29/2016); Minimal burning sensation bilateral knee and legs, no pain level number provided. Pt also states feeling pain in her knees during the weekend. No pain level provided (09/07/2016); 3/10 L leg  burning sensation at most for the past 7  days (09/28/2016); bilateral knee burning sensation 8/10 at worst (10/29/2016); 7-8/10 bilateral knee pain, burning comes and goes (12/09/2016); 7-8/10 R knee, 5/10 L knee pain at most for the past 7 days (01/14/2017); 6-7/10 burning in L lower leg (04/12/17)    Time  6    Period  Weeks    Status  On-going      PT LONG TERM GOAL #6   Title  Pt will improve seated R hip flexion strength to at least 3+/5 to promote ability to ambulate and perform standing tasks.     Baseline  2+/5 seated R hip flexion (09/28/2016), (10/29/2016); (12/09/2016), (01/14/2017), (04/12/17)    Time  8    Period  Weeks    Status  On-going      PT LONG TERM GOAL #7   Title  Pt will improve her LEFS score to 40/80 or more as a demonstration of improved function.     Baseline  31/80 (12/09/2016); 18/80, pt however states increased bilateral knee pain recently (01/14/2017); 20/80 (04/12/17)    Time  8    Period  Weeks    Status  On-going            Plan - 05/18/17 1456    Clinical Impression Statement  Pt states feeling like she is walking pretty good after session. Continued worlking on trunk and hip flexor strengtthening as well as with glute and quad strengthening to promote abiity to perform functional tasks such as gait and to promote foot clearance.     Rehab Potential  Fair    Clinical Impairments Affecting Rehab Potential  Chronicity of condition, posible decreasing motivation (observed)    PT Frequency  2x / week    PT Duration  6 weeks    PT Treatment/Interventions  Electrical Stimulation;Aquatic Therapy;Ultrasound;Gait training;Functional mobility training;Therapeutic activities;Therapeutic exercise;Balance training;Neuromuscular re-education;Patient/family education;Manual techniques;Dry needling    PT Next Visit Plan  LE strengthening, modalities PRN    PT Home Exercise Plan  LAQ, SLR, marching in sitting and standing, hip IR with GTB, knee F with GTB    Consulted and  Agree with Plan of Care  Patient       Patient will benefit from skilled therapeutic intervention in order to improve the following deficits and impairments:  Pain, Abnormal gait, Decreased balance, Decreased range of motion, Decreased strength, Difficulty walking  Visit Diagnosis: Muscle weakness (generalized)  Difficulty in walking, not elsewhere classified     Problem List Patient Active Problem List   Diagnosis Date Noted  . Weakness 11/03/2012    [name] PT, DPT   05/18/2017, 7:35 PM  [facility] [address] [place], [address] Phone: [phone]   Fax:  [phone]  Name: NAZANIN KINNER MRN: [id number] Date of Birth: July 03, 1945

## 2017-05-19 ENCOUNTER — Encounter

## 2017-05-20 ENCOUNTER — Ambulatory Visit: Payer: Medicare Other

## 2017-05-20 DIAGNOSIS — M79605 Pain in left leg: Secondary | ICD-10-CM

## 2017-05-20 DIAGNOSIS — M79604 Pain in right leg: Secondary | ICD-10-CM | POA: Diagnosis not present

## 2017-05-20 DIAGNOSIS — R262 Difficulty in walking, not elsewhere classified: Secondary | ICD-10-CM

## 2017-05-20 DIAGNOSIS — M25562 Pain in left knee: Secondary | ICD-10-CM | POA: Diagnosis not present

## 2017-05-20 DIAGNOSIS — M25561 Pain in right knee: Secondary | ICD-10-CM | POA: Diagnosis not present

## 2017-05-20 DIAGNOSIS — M6281 Muscle weakness (generalized): Secondary | ICD-10-CM | POA: Diagnosis not present

## 2017-05-20 NOTE — Therapy (Signed)
[facility] [address], [place], [address] Phone: [phone]   Fax:  [phone]  Physical Therapy Treatment  Patient Details  Name: [name] MRN: [id number] Date of Birth: Dec 27, 1945 Referring Provider: Shary Decamp, [place]   Encounter Date: 05/20/2017  PT End of Session - 05/20/17 1440    Visit Number  39    Number of Visits  81    Date for PT Re-Evaluation  07/15/17    PT Start Time  1440    PT Stop Time  1526    PT Time Calculation (min)  46 min    Activity Tolerance  Patient tolerated treatment well    Behavior During Therapy  [facility] for tasks assessed/performed       Past Medical History:  Diagnosis Date  . Arthritis   . Depression     Past Surgical History:  Procedure Laterality Date  . ABDOMINAL HYSTERECTOMY  1995  . back sugery    . BUNIONECTOMY  2013   rt foot  . COLONOSCOPY    . MUSCLE BIOPSY Left 10/03/2012   Procedure: LEFT QUADRICEP MUSCLE BIOPSY;  Surgeon: Odis Hollingshead, MD;  Location: [facility];  Service: General;  Laterality: Left;  . NECK SURGERY  2010   cerv disc fused     There were no vitals filed for this visit.  Subjective Assessment - 05/20/17 1444    Subjective  Pt states feeling tired after doing a lot of chores this morning including laundry. The knees are ok. Just tired today.     Pertinent History  LE weakness.  Symptoms occured suddenly, unknown method of injury prior to her first neck fusion surgery on January 2010. Had lower back surgery fusion in 2011.  The neck and back surgeries did not help. Pt states having increased urinary urgency and takes medication for for it. MD aware.  Denies saddle anesthesia.  Pt states that her doctor told her that PT is the only thing that is going to help her keep moving so she continues to participate in PT.  Last round of PT was last year which helped.  Currently has difficulty walking, performing chores  (wash dishes, laundry), cooking. Better able to do her tasks a little bit when she does therapy but gets harder when she stops.  Feels burning and stinging in both her knees, and bilateral anterior and lateral legs.  Pt states not having back or neck pain. Just a stiff neck.  Pt states she usually walks with a cane on her R side. No falls within the last 6 months.      Patient Stated Goals  Be better able to walk, and get around better without the stinging and burning in her knees and legs.     Currently in Pain?  No/denies    Pain Onset  More than a month ago         [facility] PT Assessment - 05/20/17 1450      Observation/Other Assessments   Lower Extremity Functional Scale   20/80      Strength   Right Hip Flexion  2+/5                          PT Education - 05/20/17 1454    Education provided  Yes    Education Details  ther-ex    [employer]) Educated  Patient    Methods  Explanation;Demonstration;Tactile cues;Verbal cues    Comprehension  Returned demonstration;Verbalized understanding         Objectives Pt wearing her AFO R foot.    Therapeutic exercise   Sitting on dyna disc    Sit <> stand with emphasis on femoral control 8x, then 6x   Light breakfast bar snack provided secondary to pt stating not eating for 6 hours. Pt states no food allergy.   Sitting on dyna disc             upright posture with manual perturbation from PT 1 min x 3                          Seated assisted R hip flexion with holds throughout the range 10x, then 8x with PT assist   Seated manually resisted R leg press 5x3 medium/hard level of resistance.   Seated L hip extension with R hip flexion 5x    Then with PT assist to promote flexion past height pt able to lift thigh 5x. Muscle contraction felt at 3-/5 range (hip flexion past halfway point but not at full range)  Seated manually resisted L hip extension 5x2  Improved exercise technique, movement at target joints,  use of target muscles after min to mod verbal, visual, tactile cues.     Pt states that her R LE does not feel as heavy after session. Pt currently demonstrates 2+/5 R hip flexion strength but shows potential for 3-/5 with R hip flexor muscle contraction felt with seated R hip flexion past halfway point of range of motion. Continued working on trunk, hip flexor, and glute max muscle strengthening to improve ability to ambulate as well as continued working on femoral control with activities such as sit <> stand transfers due to poor control and to help with progress with overall decreased bilateral knee and leg pain. Pt still demonstrates R LE weakness, bilateral knee and leg pain, difficulty with femoral control which all make ambulation and performing standing tasks challenging for her. Pt will benefit from continued skilled physical therapy services to address the aforementioned deficits. Pt to try aquatic therapy to help promote LE strengthening in the standing position.           PT Long Term Goals - 05/20/17 1500      PT LONG TERM GOAL #1   Title  Pt will be independent with her HEP to promote LE strength in function.    Time  8    Period  Weeks    Status  On-going    Target Date  07/15/17      PT LONG TERM GOAL #2   Title  Patient will improve bilateral LE strength by at least 1/2 MMT grade to promote ability to ambulate and perform functional tasks.     Time  8    Period  Weeks    Status  Achieved      PT LONG TERM GOAL #3   Title  Patient will improve her LEFS score by at least 9 points as a demonstration of improved function.     Baseline  14/80 (07/29/2016); 18/80 (10/29/2016); 31/80 (12/09/2016); 18/80, pt however states increased bilateral knee pain recently (01/14/2017); 20/80 (04/12/17); 20/80 (05/20/2017)    Time  8    Period  Weeks    Status  On-going    Target Date  07/15/17      PT LONG TERM GOAL #4   Title  Pt will report being able to walk/stand with SPC over 35 min  to promote mobility.     Baseline  Pt states being able to walk for about 30 min (07/29/2016); about 1 hour per pt reports (09/07/2016)    Time  8    Period  Weeks    Status  Achieved      PT LONG TERM GOAL #5   Title  Patient will have a decrease in bilateral LE pain to 5/10 or less at worst to promote ability to perform functional tasks.     Baseline  8/10 at worst (07/29/2016); Minimal burning sensation bilateral knee and legs, no pain level number provided. Pt also states feeling pain in her knees during the weekend. No pain level provided (09/07/2016); 3/10 L leg burning sensation at most for the past 7 days (09/28/2016); bilateral knee burning sensation 8/10 at worst (10/29/2016); 7-8/10 bilateral knee pain, burning comes and goes (12/09/2016); 7-8/10 R knee, 5/10 L knee pain at most for the past 7 days (01/14/2017); 6-7/10 burning in L lower leg (04/12/17); 1-2/10 (05/18/2017)    Time  8    Period  Weeks    Status  On-going    Target Date  07/15/17      PT LONG TERM GOAL #6   Title  Pt will improve seated R hip flexion strength to at least 3+/5 to promote ability to ambulate and perform standing tasks.     Baseline  2+/5 seated R hip flexion (09/28/2016), (10/29/2016); (12/09/2016), (01/14/2017), (04/12/17); 2+/5 (05/20/2017)    Time  8    Period  Weeks    Status  On-going    Target Date  07/15/17      PT LONG TERM GOAL #7   Title  Pt will improve her LEFS score to 40/80 or more as a demonstration of improved function.     Baseline  31/80 (12/09/2016); 18/80, pt however states increased bilateral knee pain recently (01/14/2017); 20/80 (04/12/17), (05/20/2017)    Time  8    Period  Weeks    Status  On-going    Target Date  07/15/17            Plan - 05/20/17 1455    Clinical Impression Statement  Pt states that her R LE does not feel as heavy after session. Pt currently demonstrates 2+/5 R hip flexion strength but shows potential for 3-/5 with R hip flexor muscle contraction felt with seated R  hip flexion past halfway point of range of motion. Continued working on trunk, hip flexor, and glute max muscle strengthening to improve ability to ambulate as well as continued working on femoral control with activities such as sit <> stand transfers due to poor control and to help with progress with overall decreased bilateral knee and leg pain. Pt still demonstrates R LE weakness, bilateral knee and leg pain, difficulty with femoral control which all make ambulation and performing standing tasks challenging for her. Pt will benefit from continued skilled physical therapy services to address the aforementioned deficits. Pt to try aquatic therapy to help promote LE strengthening in the standing position.      History and Personal Factors relevant to plan of care:  Chronicity of condition, weakness    Clinical Presentation  Stable    Clinical Presentation due to:  improved bilateral knee and leg pain, potential for improved R hip flexor strength palpated during exercise    Clinical Decision Making  Low    Rehab Potential  Fair    Clinical Impairments Affecting Rehab Potential  Chronicity of condition, posible decreasing motivation (observed)    PT Frequency  2x / week    PT Duration  8 weeks    PT Treatment/Interventions  Electrical Stimulation;Aquatic Therapy;Ultrasound;Gait training;Functional mobility training;Therapeutic activities;Therapeutic exercise;Balance training;Neuromuscular re-education;Patient/family education;Manual techniques;Dry needling;Iontophoresis 4mg /ml Dexamethasone    PT Next Visit Plan  LE strengthening, modalities PRN    PT Home Exercise Plan  --    Consulted and Agree with Plan of Care  Patient       Patient will benefit from skilled therapeutic intervention in order to improve the following deficits and impairments:  Pain, Abnormal gait, Decreased balance, Decreased range of motion, Decreased strength, Difficulty walking  Visit Diagnosis: Muscle weakness (generalized) -  Plan: PT plan of care cert/re-cert  Difficulty in walking, not elsewhere classified - Plan: PT plan of care cert/re-cert  Left knee pain, unspecified chronicity - Plan: PT plan of care cert/re-cert  Right knee pain, unspecified chronicity - Plan: PT plan of care cert/re-cert  Pain in left leg - Plan: PT plan of care cert/re-cert  Pain in right leg - Plan: PT plan of care cert/re-cert     Problem List Patient Active Problem List   Diagnosis Date Noted  . Weakness 11/03/2012    [name] PT, DPT   05/20/2017, 4:04 PM  [facility] [address] [place], [address] Phone: [phone]   Fax:  [phone]  Name: [name] MRN: [id number] Date of Birth: 1945/04/01

## 2017-05-25 ENCOUNTER — Ambulatory Visit: Payer: Medicare Other | Attending: Physician Assistant

## 2017-05-25 ENCOUNTER — Ambulatory Visit: Payer: Medicare Other

## 2017-05-25 DIAGNOSIS — M6281 Muscle weakness (generalized): Secondary | ICD-10-CM | POA: Diagnosis not present

## 2017-05-25 DIAGNOSIS — R262 Difficulty in walking, not elsewhere classified: Secondary | ICD-10-CM | POA: Diagnosis not present

## 2017-05-25 NOTE — Therapy (Signed)
[facility] [address] [place], [address] Phone: [phone]   Fax:  [phone]  Physical Therapy Treatment  Patient Details  Name: [name] MRN: [id number] Date of Birth: 31-Jul-1945 Referring Provider: Shary Decamp, [place]   Encounter Date: 05/25/2017  PT End of Session - 05/25/17 1435    Visit Number  40    Number of Visits  56    Date for PT Re-Evaluation  07/15/17    PT Start Time  1435    PT Stop Time  1515    PT Time Calculation (min)  40 min    Activity Tolerance  Patient tolerated treatment well    Behavior During Therapy  [facility] for tasks assessed/performed       Past Medical History:  Diagnosis Date  . Arthritis   . Depression     Past Surgical History:  Procedure Laterality Date  . ABDOMINAL HYSTERECTOMY  1995  . back sugery    . BUNIONECTOMY  2013   rt foot  . COLONOSCOPY    . MUSCLE BIOPSY Left 10/03/2012   Procedure: LEFT QUADRICEP MUSCLE BIOPSY;  Surgeon: Odis Hollingshead, MD;  Location: [facility];  Service: General;  Laterality: Left;  . NECK SURGERY  2010   cerv disc fused     There were no vitals filed for this visit.  Subjective Assessment - 05/25/17 1438    Subjective  Knees and legs are good. Trying to be mindfull of how she stands up and sits down. Picking up her R thigh won't do what she wants to. Some days she can do it better.     Pertinent History  LE weakness.  Symptoms occured suddenly, unknown method of injury prior to her first neck fusion surgery on January 2010. Had lower back surgery fusion in 2011.  The neck and back surgeries did not help. Pt states having increased urinary urgency and takes medication for for it. MD aware.  Denies saddle anesthesia.  Pt states that her doctor told her that PT is the only thing that is going to help her keep moving so she continues to participate in PT.  Last round of PT was last year which helped.   Currently has difficulty walking, performing chores (wash dishes, laundry), cooking. Better able to do her tasks a little bit when she does therapy but gets harder when she stops.  Feels burning and stinging in both her knees, and bilateral anterior and lateral legs.  Pt states not having back or neck pain. Just a stiff neck.  Pt states she usually walks with a cane on her R side. No falls within the last 6 months.      Patient Stated Goals  Be better able to walk, and get around better without the stinging and burning in her knees and legs.     Currently in Pain?  No/denies    Pain Onset  More than a month ago                              PT Education - 05/25/17 1514    Education provided  Yes    Education Details  ther-ex    [employer]) Educated  Patient    Methods  Explanation;Demonstration;Tactile cues;Verbal cues    Comprehension  Returned demonstration;Verbalized understanding         Objectives Pt wearing her AFO R  foot.    Therapeutic exercise   Sitting on dyna disc                              Seated L hip extension with R hip flexion 10x                         Then with PT assist to promote flexion past height pt able to lift thigh 7x2. Holds throughout range    Increased time secondary to emphasis on quality of movement.   Seated manually resisted R leg press 5x4 medium/hard level of resistance.     Increased time secondary to emphasis on quality of movement and muscle contraction.   Standing R hip flexion then manually resisted R hip extension with bilateral UE assist from treadmill bars 10x    Improved exercise technique, movement at target joints, use of target muscles after mod verbal, visual, tactile cues.    R hip flexor muscle activation felt past midrange. Continued working on trunk and glute max and R hip flexor muscle strength to promote ability to pick up R foot more when walking. Pt tolerated session well without aggravation of  symptoms.          PT Long Term Goals - 05/20/17 1500      PT LONG TERM GOAL #1   Title  Pt will be independent with her HEP to promote LE strength in function.    Time  8    Period  Weeks    Status  On-going    Target Date  07/15/17      PT LONG TERM GOAL #2   Title  Patient will improve bilateral LE strength by at least 1/2 MMT grade to promote ability to ambulate and perform functional tasks.     Time  8    Period  Weeks    Status  Achieved      PT LONG TERM GOAL #3   Title  Patient will improve her LEFS score by at least 9 points as a demonstration of improved function.     Baseline  14/80 (07/29/2016); 18/80 (10/29/2016); 31/80 (12/09/2016); 18/80, pt however states increased bilateral knee pain recently (01/14/2017); 20/80 (04/12/17); 20/80 (05/20/2017)    Time  8    Period  Weeks    Status  On-going    Target Date  07/15/17      PT LONG TERM GOAL #4   Title  Pt will report being able to walk/stand with SPC over 35 min to promote mobility.     Baseline  Pt states being able to walk for about 30 min (07/29/2016); about 1 hour per pt reports (09/07/2016)    Time  8    Period  Weeks    Status  Achieved      PT LONG TERM GOAL #5   Title  Patient will have a decrease in bilateral LE pain to 5/10 or less at worst to promote ability to perform functional tasks.     Baseline  8/10 at worst (07/29/2016); Minimal burning sensation bilateral knee and legs, no pain level number provided. Pt also states feeling pain in her knees during the weekend. No pain level provided (09/07/2016); 3/10 L leg burning sensation at most for the past 7 days (09/28/2016); bilateral knee burning sensation 8/10 at worst (10/29/2016); 7-8/10 bilateral knee pain, burning comes and goes (12/09/2016); 7-8/10 R knee, 5/10  L knee pain at most for the past 7 days (01/14/2017); 6-7/10 burning in L lower leg (04/12/17); 1-2/10 (05/18/2017)    Time  8    Period  Weeks    Status  On-going    Target Date  07/15/17      PT  LONG TERM GOAL #6   Title  Pt will improve seated R hip flexion strength to at least 3+/5 to promote ability to ambulate and perform standing tasks.     Baseline  2+/5 seated R hip flexion (09/28/2016), (10/29/2016); (12/09/2016), (01/14/2017), (04/12/17); 2+/5 (05/20/2017)    Time  8    Period  Weeks    Status  On-going    Target Date  07/15/17      PT LONG TERM GOAL #7   Title  Pt will improve her LEFS score to 40/80 or more as a demonstration of improved function.     Baseline  31/80 (12/09/2016); 18/80, pt however states increased bilateral knee pain recently (01/14/2017); 20/80 (04/12/17), (05/20/2017)    Time  8    Period  Weeks    Status  On-going    Target Date  07/15/17            Plan - 05/25/17 1515    Clinical Impression Statement  R hip flexor muscle activation felt past midrange. Continued working on trunk and glute max and R hip flexor muscle strength to promote ability to pick up R foot more when walking. Pt tolerated session well without aggravation of symptoms.     Rehab Potential  Fair    Clinical Impairments Affecting Rehab Potential  Chronicity of condition, posible decreasing motivation (observed)    PT Frequency  2x / week    PT Duration  8 weeks    PT Treatment/Interventions  Electrical Stimulation;Aquatic Therapy;Ultrasound;Gait training;Functional mobility training;Therapeutic activities;Therapeutic exercise;Balance training;Neuromuscular re-education;Patient/family education;Manual techniques;Dry needling;Iontophoresis 4mg /ml Dexamethasone    PT Next Visit Plan  LE strengthening, modalities PRN    Consulted and Agree with Plan of Care  Patient       Patient will benefit from skilled therapeutic intervention in order to improve the following deficits and impairments:  Pain, Abnormal gait, Decreased balance, Decreased range of motion, Decreased strength, Difficulty walking  Visit Diagnosis: Muscle weakness (generalized)  Difficulty in walking, not elsewhere  classified     Problem List Patient Active Problem List   Diagnosis Date Noted  . Weakness 11/03/2012    [name] PT, DPT   05/25/2017, 6:07 PM  [facility] [address], [place], [address] Phone: [phone]   Fax:  [phone]  Name: [name] MRN: [id number] Date of Birth: Sep 25, 1945

## 2017-05-27 ENCOUNTER — Ambulatory Visit: Payer: Medicare Other

## 2017-05-31 ENCOUNTER — Encounter

## 2017-05-31 ENCOUNTER — Ambulatory Visit: Payer: Medicare Other

## 2017-05-31 DIAGNOSIS — M6281 Muscle weakness (generalized): Secondary | ICD-10-CM

## 2017-05-31 DIAGNOSIS — R262 Difficulty in walking, not elsewhere classified: Secondary | ICD-10-CM

## 2017-05-31 NOTE — Therapy (Signed)
[facility] [address] [place], [address] Phone: [phone]   Fax:  [phone]  Physical Therapy Treatment  Patient Details  Name: [name] MRN: [id number] Date of Birth: 11-19-1945 Referring Provider: Shary Decamp, [place]   Encounter Date: 05/31/2017  PT End of Session - 05/31/17 1703    Visit Number  41    Number of Visits  19    Date for PT Re-Evaluation  07/15/17    PT Start Time  1703    PT Stop Time  1721    PT Time Calculation (min)  18 min    Activity Tolerance  Patient tolerated treatment well    Behavior During Therapy  [facility] for tasks assessed/performed       Past Medical History:  Diagnosis Date  . Arthritis   . Depression     Past Surgical History:  Procedure Laterality Date  . ABDOMINAL HYSTERECTOMY  1995  . back sugery    . BUNIONECTOMY  2013   rt foot  . COLONOSCOPY    . MUSCLE BIOPSY Left 10/03/2012   Procedure: LEFT QUADRICEP MUSCLE BIOPSY;  Surgeon: Odis Hollingshead, MD;  Location: [facility];  Service: General;  Laterality: Left;  . NECK SURGERY  2010   cerv disc fused     There were no vitals filed for this visit.  Subjective Assessment - 05/31/17 1706    Subjective  Pt states R leg started swelling last Thursday. Started at her R proximal medial calf. Did not do anything different. Could not get her AFO on for the past few day because it was hurting. Was warm a little.   Did not have R knee pain.  Pt states feeling a knot in her R medial calf.     Pertinent History  LE weakness.  Symptoms occured suddenly, unknown method of injury prior to her first neck fusion surgery on January 2010. Had lower back surgery fusion in 2011.  The neck and back surgeries did not help. Pt states having increased urinary urgency and takes medication for for it. MD aware.  Denies saddle anesthesia.  Pt states that her doctor told her that PT is the only thing that is going  to help her keep moving so she continues to participate in PT.  Last round of PT was last year which helped.  Currently has difficulty walking, performing chores (wash dishes, laundry), cooking. Better able to do her tasks a little bit when she does therapy but gets harder when she stops.  Feels burning and stinging in both her knees, and bilateral anterior and lateral legs.  Pt states not having back or neck pain. Just a stiff neck.  Pt states she usually walks with a cane on her R side. No falls within the last 6 months.      Patient Stated Goals  Be better able to walk, and get around better without the stinging and burning in her knees and legs.     Pain Onset  More than a month ago                                  Objectives  Pt states feeling a knot in her R medial calf   R knee, leg, ankle swelling observed. Slight pinkness compared to the L leg. No increased warmth.   Increased soreness with gentle calf  squeeze   Pt was recommended to contact her MD about it. Pt states she will call her MD for an appointment tomorrow. Go to ER if she has difficulty breathing, or increased swelling, redness, pain.  Pt demonstrated understanding. Pt to get a note from healthcare provider who checks her out to let us know that she is clear to continue PT. Pt verbalized understanding.    PT treatment not performed today secondary to  R knee, leg and ankle swelling with increased soreness with gentle calf squeeze as well as pt stating having a knot and soreness at her R medial proximal gastroc and hx of warmth when the swelling started.  Pt to see her doctor prior to continuing PT.   PT Long Term Goals - 05/20/17 1500      PT LONG TERM GOAL #1   Title  Pt will be independent with her HEP to promote LE strength in function.    Time  8    Period  Weeks    Status  On-going    Target Date  07/15/17      PT LONG TERM GOAL #2   Title  Patient will improve bilateral LE strength by  at least 1/2 MMT grade to promote ability to ambulate and perform functional tasks.     Time  8    Period  Weeks    Status  Achieved      PT LONG TERM GOAL #3   Title  Patient will improve her LEFS score by at least 9 points as a demonstration of improved function.     Baseline  14/80 (07/29/2016); 18/80 (10/29/2016); 31/80 (12/09/2016); 18/80, pt however states increased bilateral knee pain recently (01/14/2017); 20/80 (04/12/17); 20/80 (05/20/2017)    Time  8    Period  Weeks    Status  On-going    Target Date  07/15/17      PT LONG TERM GOAL #4   Title  Pt will report being able to walk/stand with SPC over 35 min to promote mobility.     Baseline  Pt states being able to walk for about 30 min (07/29/2016); about 1 hour per pt reports (09/07/2016)    Time  8    Period  Weeks    Status  Achieved      PT LONG TERM GOAL #5   Title  Patient will have a decrease in bilateral LE pain to 5/10 or less at worst to promote ability to perform functional tasks.     Baseline  8/10 at worst (07/29/2016); Minimal burning sensation bilateral knee and legs, no pain level number provided. Pt also states feeling pain in her knees during the weekend. No pain level provided (09/07/2016); 3/10 L leg burning sensation at most for the past 7 days (09/28/2016); bilateral knee burning sensation 8/10 at worst (10/29/2016); 7-8/10 bilateral knee pain, burning comes and goes (12/09/2016); 7-8/10 R knee, 5/10 L knee pain at most for the past 7 days (01/14/2017); 6-7/10 burning in L lower leg (04/12/17); 1-2/10 (05/18/2017)    Time  8    Period  Weeks    Status  On-going    Target Date  07/15/17      PT LONG TERM GOAL #6   Title  Pt will improve seated R hip flexion strength to at least 3+/5 to promote ability to ambulate and perform standing tasks.     Baseline  2+/5 seated R hip flexion (09/28/2016), (10/29/2016); (12/09/2016), (01/14/2017), (04/12/17); 2+/5 (05/20/2017)  Time  8    Period  Weeks    Status  On-going    Target Date   07/15/17      PT LONG TERM GOAL #7   Title  Pt will improve her LEFS score to 40/80 or more as a demonstration of improved function.     Baseline  31/80 (12/09/2016); 18/80, pt however states increased bilateral knee pain recently (01/14/2017); 20/80 (04/12/17), (05/20/2017)    Time  8    Period  Weeks    Status  On-going    Target Date  07/15/17            Plan - 05/31/17 1727    Clinical Impression Statement  PT treatment not performed today secondary to  R knee, leg and ankle swelling with increased soreness with gentle calf squeeze as well as pt stating having a knot and soreness at her R medial proximal gastroc and hx of warmth when the swelling started.  Pt to see her doctor prior to continuing PT.     Rehab Potential  Fair    Clinical Impairments Affecting Rehab Potential  Chronicity of condition, posible decreasing motivation (observed)    PT Frequency  2x / week    PT Duration  8 weeks    PT Treatment/Interventions  Electrical Stimulation;Aquatic Therapy;Ultrasound;Gait training;Functional mobility training;Therapeutic activities;Therapeutic exercise;Balance training;Neuromuscular re-education;Patient/family education;Manual techniques;Dry needling;Iontophoresis 4mg /ml Dexamethasone    PT Next Visit Plan  LE strengthening, modalities PRN    Consulted and Agree with Plan of Care  Patient       Patient will benefit from skilled therapeutic intervention in order to improve the following deficits and impairments:  Pain, Abnormal gait, Decreased balance, Decreased range of motion, Decreased strength, Difficulty walking  Visit Diagnosis: Muscle weakness (generalized)  Difficulty in walking, not elsewhere classified     Problem List Patient Active Problem List   Diagnosis Date Noted  . Weakness 11/03/2012    [name] PT, DPT   05/31/2017, 5:37 PM  [facility] [address] [place],  [address] Phone: [phone]   Fax:  [phone]  Name: [name] MRN: [id number] Date of Birth: Aug 05, 1945

## 2017-06-02 ENCOUNTER — Ambulatory Visit: Payer: Medicare Other

## 2017-06-02 ENCOUNTER — Encounter

## 2017-06-02 ENCOUNTER — Telehealth: Payer: Self-pay

## 2017-06-02 DIAGNOSIS — M79604 Pain in right leg: Secondary | ICD-10-CM | POA: Diagnosis not present

## 2017-06-02 DIAGNOSIS — F329 Major depressive disorder, single episode, unspecified: Secondary | ICD-10-CM | POA: Diagnosis not present

## 2017-06-02 DIAGNOSIS — M5136 Other intervertebral disc degeneration, lumbar region: Secondary | ICD-10-CM | POA: Diagnosis not present

## 2017-06-02 DIAGNOSIS — R600 Localized edema: Secondary | ICD-10-CM | POA: Diagnosis not present

## 2017-06-02 DIAGNOSIS — M79661 Pain in right lower leg: Secondary | ICD-10-CM | POA: Diagnosis not present

## 2017-06-02 DIAGNOSIS — K219 Gastro-esophageal reflux disease without esophagitis: Secondary | ICD-10-CM | POA: Diagnosis not present

## 2017-06-02 DIAGNOSIS — G959 Disease of spinal cord, unspecified: Secondary | ICD-10-CM | POA: Diagnosis not present

## 2017-06-02 NOTE — Telephone Encounter (Signed)
Called patient earlier who said that she was at the [place] office getting her R leg checked out.

## 2017-06-07 DIAGNOSIS — M170 Bilateral primary osteoarthritis of knee: Secondary | ICD-10-CM | POA: Diagnosis not present

## 2017-06-07 DIAGNOSIS — M7120 Synovial cyst of popliteal space [Baker], unspecified knee: Secondary | ICD-10-CM | POA: Diagnosis not present

## 2017-06-07 DIAGNOSIS — M1180 Other specified crystal arthropathies, unspecified site: Secondary | ICD-10-CM | POA: Diagnosis not present

## 2017-06-07 DIAGNOSIS — M25461 Effusion, right knee: Secondary | ICD-10-CM | POA: Diagnosis not present

## 2017-06-07 DIAGNOSIS — R768 Other specified abnormal immunological findings in serum: Secondary | ICD-10-CM | POA: Diagnosis not present

## 2017-06-07 DIAGNOSIS — M25561 Pain in right knee: Secondary | ICD-10-CM | POA: Diagnosis not present

## 2017-06-08 ENCOUNTER — Encounter

## 2017-06-08 ENCOUNTER — Ambulatory Visit: Payer: Medicare Other

## 2017-06-10 ENCOUNTER — Encounter

## 2017-06-10 ENCOUNTER — Ambulatory Visit: Payer: Medicare Other

## 2017-06-15 ENCOUNTER — Ambulatory Visit: Payer: Medicare Other

## 2017-06-17 ENCOUNTER — Ambulatory Visit: Payer: Medicare Other

## 2017-06-22 ENCOUNTER — Ambulatory Visit: Payer: Medicare Other | Attending: Physician Assistant

## 2017-06-22 ENCOUNTER — Other Ambulatory Visit: Payer: Self-pay

## 2017-06-22 DIAGNOSIS — M25561 Pain in right knee: Secondary | ICD-10-CM | POA: Insufficient documentation

## 2017-06-22 DIAGNOSIS — M2560 Stiffness of unspecified joint, not elsewhere classified: Secondary | ICD-10-CM | POA: Insufficient documentation

## 2017-06-22 DIAGNOSIS — M25562 Pain in left knee: Secondary | ICD-10-CM | POA: Diagnosis not present

## 2017-06-22 DIAGNOSIS — M79604 Pain in right leg: Secondary | ICD-10-CM | POA: Insufficient documentation

## 2017-06-22 DIAGNOSIS — R262 Difficulty in walking, not elsewhere classified: Secondary | ICD-10-CM | POA: Insufficient documentation

## 2017-06-22 DIAGNOSIS — M79605 Pain in left leg: Secondary | ICD-10-CM | POA: Diagnosis not present

## 2017-06-22 DIAGNOSIS — M5386 Other specified dorsopathies, lumbar region: Secondary | ICD-10-CM

## 2017-06-22 DIAGNOSIS — M6281 Muscle weakness (generalized): Secondary | ICD-10-CM | POA: Insufficient documentation

## 2017-06-22 DIAGNOSIS — M545 Low back pain: Secondary | ICD-10-CM | POA: Diagnosis not present

## 2017-06-22 DIAGNOSIS — R269 Unspecified abnormalities of gait and mobility: Secondary | ICD-10-CM | POA: Diagnosis not present

## 2017-06-22 DIAGNOSIS — R2689 Other abnormalities of gait and mobility: Secondary | ICD-10-CM | POA: Diagnosis not present

## 2017-06-22 DIAGNOSIS — R2681 Unsteadiness on feet: Secondary | ICD-10-CM

## 2017-06-22 DIAGNOSIS — R29898 Other symptoms and signs involving the musculoskeletal system: Secondary | ICD-10-CM

## 2017-06-22 NOTE — Therapy (Signed)
[facility] [facility] [address] [place], [place], [address] Phone: [phone]   Fax:  [phone]  Physical Therapy Treatment  Patient Details  Name: [name] MRN: [id number] Date of Birth: 02/04/1946 Referring Provider: Shary Decamp, [place]   Encounter Date: 06/22/2017  PT End of Session - 06/22/17 1923    Visit Number  42    Number of Visits  81    Date for PT Re-Evaluation  07/15/17    PT Start Time  1030    PT Stop Time  1120    PT Time Calculation (min)  50 min    Activity Tolerance  Patient tolerated treatment well    Behavior During Therapy  [facility] for tasks assessed/performed       Past Medical History:  Diagnosis Date  . Arthritis   . Depression     Past Surgical History:  Procedure Laterality Date  . ABDOMINAL HYSTERECTOMY  1995  . back sugery    . BUNIONECTOMY  2013   rt foot  . COLONOSCOPY    . MUSCLE BIOPSY Left 10/03/2012   Procedure: LEFT QUADRICEP MUSCLE BIOPSY;  Surgeon: Odis Hollingshead, MD;  Location: [facility];  Service: General;  Laterality: Left;  . NECK SURGERY  2010   cerv disc fused     There were no vitals filed for this visit.  Subjective Assessment - 06/22/17 1919    Subjective  Pt reports B knee to ankle burning type pain. States fluid was drained from R knee resolving R calf swelling and pain (notes L calf is larger now). Pt reports difficulty walking due to LE weakness R > L without definitive medical reasoning as to why. Pt frustrated.     Pertinent History  LE weakness.  Symptoms occured suddenly, unknown method of injury prior to her first neck fusion surgery on January 2010. Had lower back surgery fusion in 2011.  The neck and back surgeries did not help. Pt states having increased urinary urgency and takes medication for for it. MD aware.  Denies saddle anesthesia.  Pt states that her doctor told her that PT is the only thing that is going to help her keep moving  so she continues to participate in PT.  Last round of PT was last year which helped.  Currently has difficulty walking, performing chores (wash dishes, laundry), cooking. Better able to do her tasks a little bit when she does therapy but gets harder when she stops.  Feels burning and stinging in both her knees, and bilateral anterior and lateral legs.  Pt states not having back or neck pain. Just a stiff neck.  Pt states she usually walks with a cane on her R side. No falls within the last 6 months.        Enters/exits via ramp Participates in the following  Ambulation with blue dumbbells for support  4 L fwd  4 L side  Core stabilization with LE work, 20x ea  hip abd/add  hip flex/ext  Bench, core/LE work  STS no UE, 20x   Bike, 2 min  Flutter, 2 min  Scissor, 2 min  Core with UE work  [employer], 2 x 10 ea   Triceps press downs   Sh abd/add   Sh flex/ext   Sh horiz abd/add  Stretching at step, 3 x 10 sec ea  hamstrings  hip flexors  gastroc  PT Education - 06/22/17 1922    Education provided  Yes    Education Details  Educated on properties of water and benefits of aquatic exercise. Core stabilization as it pertains to exercise and posture. Walking posture. Stretching LEs.           PT Long Term Goals - 05/20/17 1500      PT LONG TERM GOAL #1   Title  Pt will be independent with her HEP to promote LE strength in function.    Time  8    Period  Weeks    Status  On-going    Target Date  07/15/17      PT LONG TERM GOAL #2   Title  Patient will improve bilateral LE strength by at least 1/2 MMT grade to promote ability to ambulate and perform functional tasks.     Time  8    Period  Weeks    Status  Achieved      PT LONG TERM GOAL #3   Title  Patient will improve her LEFS score by at least 9 points as a demonstration of improved function.     Baseline  14/80 (07/29/2016); 18/80 (10/29/2016); 31/80 (12/09/2016);  18/80, pt however states increased bilateral knee pain recently (01/14/2017); 20/80 (04/12/17); 20/80 (05/20/2017)    Time  8    Period  Weeks    Status  On-going    Target Date  07/15/17      PT LONG TERM GOAL #4   Title  Pt will report being able to walk/stand with SPC over 35 min to promote mobility.     Baseline  Pt states being able to walk for about 30 min (07/29/2016); about 1 hour per pt reports (09/07/2016)    Time  8    Period  Weeks    Status  Achieved      PT LONG TERM GOAL #5   Title  Patient will have a decrease in bilateral LE pain to 5/10 or less at worst to promote ability to perform functional tasks.     Baseline  8/10 at worst (07/29/2016); Minimal burning sensation bilateral knee and legs, no pain level number provided. Pt also states feeling pain in her knees during the weekend. No pain level provided (09/07/2016); 3/10 L leg burning sensation at most for the past 7 days (09/28/2016); bilateral knee burning sensation 8/10 at worst (10/29/2016); 7-8/10 bilateral knee pain, burning comes and goes (12/09/2016); 7-8/10 R knee, 5/10 L knee pain at most for the past 7 days (01/14/2017); 6-7/10 burning in L lower leg (04/12/17); 1-2/10 (05/18/2017)    Time  8    Period  Weeks    Status  On-going    Target Date  07/15/17      PT LONG TERM GOAL #6   Title  Pt will improve seated R hip flexion strength to at least 3+/5 to promote ability to ambulate and perform standing tasks.     Baseline  2+/5 seated R hip flexion (09/28/2016), (10/29/2016); (12/09/2016), (01/14/2017), (04/12/17); 2+/5 (05/20/2017)    Time  8    Period  Weeks    Status  On-going    Target Date  07/15/17      PT LONG TERM GOAL #7   Title  Pt will improve her LEFS score to 40/80 or more as a demonstration of improved function.     Baseline  31/80 (12/09/2016); 18/80, pt however states increased bilateral knee pain recently (01/14/2017); 20/80 (04/12/17), (05/20/2017)  Time  8    Period  Weeks    Status  On-going    Target Date   07/15/17            Plan - 06/22/17 1924    Clinical Impression Statement  Pt demonstrates awareness of core stabilization with exercises; requires intermittent cueing. Pt requires cues as well for neutral pelvic posture with LE work and stand for strengthening benefits with exercises. Pt able to improve step length/stride and heel strike with water ambulation.    Rehab Potential  Fair    Clinical Impairments Affecting Rehab Potential  Chronicity of condition, posible decreasing motivation (observed)    PT Frequency  2x / week    PT Duration  8 weeks    PT Treatment/Interventions  Electrical Stimulation;Aquatic Therapy;Ultrasound;Gait training;Functional mobility training;Therapeutic activities;Therapeutic exercise;Balance training;Neuromuscular re-education;Patient/family education;Manual techniques;Dry needling;Iontophoresis 4mg /ml Dexamethasone    PT Next Visit Plan  LE strengthening, modalities PRN    Consulted and Agree with Plan of Care  Patient       Patient will benefit from skilled therapeutic intervention in order to improve the following deficits and impairments:  Pain, Abnormal gait, Decreased balance, Decreased range of motion, Decreased strength, Difficulty walking  Visit Diagnosis: Muscle weakness (generalized)  Difficulty in walking, not elsewhere classified  Left knee pain, unspecified chronicity  Right knee pain, unspecified chronicity  Pain in left leg  Pain in right leg  Unsteadiness on feet  Weakness of both legs  Abnormality of gait  Decreased ROM of lumbar spine  Unsteadiness     Problem List Patient Active Problem List   Diagnosis Date Noted  . Weakness 11/03/2012    Larae Grooms 06/22/2017, 7:28 PM  [place] [facility] SERVICES [address] [place], [place], [address] Phone: [phone]   Fax:  [phone]  Name: DWIGHT BURDO MRN: [id number] Date of Birth: 12-08-1945

## 2017-06-24 ENCOUNTER — Other Ambulatory Visit: Payer: Self-pay

## 2017-06-24 ENCOUNTER — Ambulatory Visit: Payer: Medicare Other

## 2017-06-24 DIAGNOSIS — R269 Unspecified abnormalities of gait and mobility: Secondary | ICD-10-CM

## 2017-06-24 DIAGNOSIS — M5386 Other specified dorsopathies, lumbar region: Secondary | ICD-10-CM

## 2017-06-24 DIAGNOSIS — M25562 Pain in left knee: Secondary | ICD-10-CM

## 2017-06-24 DIAGNOSIS — M79605 Pain in left leg: Secondary | ICD-10-CM | POA: Diagnosis not present

## 2017-06-24 DIAGNOSIS — M545 Low back pain, unspecified: Secondary | ICD-10-CM

## 2017-06-24 DIAGNOSIS — M79604 Pain in right leg: Secondary | ICD-10-CM

## 2017-06-24 DIAGNOSIS — R29898 Other symptoms and signs involving the musculoskeletal system: Secondary | ICD-10-CM

## 2017-06-24 DIAGNOSIS — R2681 Unsteadiness on feet: Secondary | ICD-10-CM

## 2017-06-24 DIAGNOSIS — R262 Difficulty in walking, not elsewhere classified: Secondary | ICD-10-CM | POA: Diagnosis not present

## 2017-06-24 DIAGNOSIS — M6281 Muscle weakness (generalized): Secondary | ICD-10-CM | POA: Diagnosis not present

## 2017-06-24 DIAGNOSIS — M25561 Pain in right knee: Secondary | ICD-10-CM

## 2017-06-24 DIAGNOSIS — R2689 Other abnormalities of gait and mobility: Secondary | ICD-10-CM

## 2017-06-24 DIAGNOSIS — M2560 Stiffness of unspecified joint, not elsewhere classified: Secondary | ICD-10-CM

## 2017-06-24 NOTE — Therapy (Signed)
[place] MAIN [facility] SERVICES [address] [place], [place], [address] Phone: [phone]   Fax:  [phone]  Physical Therapy Treatment  Patient Details  Name: [name] MRN: [id number] Date of Birth: 03-May-1945 Referring Provider: Shary Decamp, [place]   Encounter Date: 06/24/2017  PT End of Session - 06/24/17 1514    Visit Number  43    Number of Visits  81    Date for PT Re-Evaluation  07/15/17    PT Start Time  1125    PT Stop Time  1215    PT Time Calculation (min)  50 min    Activity Tolerance  Patient tolerated treatment well    Behavior During Therapy  [facility] for tasks assessed/performed       Past Medical History:  Diagnosis Date  . Arthritis   . Depression     Past Surgical History:  Procedure Laterality Date  . ABDOMINAL HYSTERECTOMY  1995  . back sugery    . BUNIONECTOMY  2013   rt foot  . COLONOSCOPY    . MUSCLE BIOPSY Left 10/03/2012   Procedure: LEFT QUADRICEP MUSCLE BIOPSY;  Surgeon: Odis Hollingshead, MD;  Location: [place];  Service: General;  Laterality: Left;  . NECK SURGERY  2010   cerv disc fused     There were no vitals filed for this visit.  Subjective Assessment - 06/24/17 1510    Subjective  Pt reports feeling tired post initial pool session that afternoon/evening, but did not affect function adversly. No pain next day. Pt did note temporary decrease in BLE burning pain post session with gradual return later that day. Currently pt has mild to mod burning pain in BLEs knees to ankles.     Pertinent History  LE weakness.  Symptoms occured suddenly, unknown method of injury prior to her first neck fusion surgery on January 2010. Had lower back surgery fusion in 2011.  The neck and back surgeries did not help. Pt states having increased urinary urgency and takes medication for for it. MD aware.  Denies saddle anesthesia.  Pt states that her doctor told her that PT is the only thing that is  going to help her keep moving so she continues to participate in PT.  Last round of PT was last year which helped.  Currently has difficulty walking, performing chores (wash dishes, laundry), cooking. Better able to do her tasks a little bit when she does therapy but gets harder when she stops.  Feels burning and stinging in both her knees, and bilateral anterior and lateral legs.  Pt states not having back or neck pain. Just a stiff neck.  Pt states she usually walks with a cane on her R side. No falls within the last 6 months.        Enters/exits via ramp Participates in the following  Ambulation, blue dumbbells  4 L fwd  4 L side  Core stabilization with LE work, 20x ea  hip abd/add  hip flex/ext  squats  Core stabilization with UE work, shoulders just out of water, 2 x 10 ea  Red dumbbells   Triceps press down   Sh abd/add   Sh flex/ext   Sh horiz abd/add  Bench, core/LE  3 min ea   Bike   Scissor with DF   Flutter with PF  STS, no UE 10x  Stretch at step, 3 x 15 sec  Ham/gastroc  Hip flexors  Stretch at  rail  Gentle side lunge, B 3 x 15 sec                         PT Education - 06/24/17 1513    Education provided  Yes    Education Details  core stabilization with UE work, squats, STS without UE, Continued on stretching          PT Long Term Goals - 05/20/17 1500      PT LONG TERM GOAL #1   Title  Pt will be independent with her HEP to promote LE strength in function.    Time  8    Period  Weeks    Status  On-going    Target Date  07/15/17      PT LONG TERM GOAL #2   Title  Patient will improve bilateral LE strength by at least 1/2 MMT grade to promote ability to ambulate and perform functional tasks.     Time  8    Period  Weeks    Status  Achieved      PT LONG TERM GOAL #3   Title  Patient will improve her LEFS score by at least 9 points as a demonstration of improved function.     Baseline  14/80 (07/29/2016); 18/80  (10/29/2016); 31/80 (12/09/2016); 18/80, pt however states increased bilateral knee pain recently (01/14/2017); 20/80 (04/12/17); 20/80 (05/20/2017)    Time  8    Period  Weeks    Status  On-going    Target Date  07/15/17      PT LONG TERM GOAL #4   Title  Pt will report being able to walk/stand with SPC over 35 min to promote mobility.     Baseline  Pt states being able to walk for about 30 min (07/29/2016); about 1 hour per pt reports (09/07/2016)    Time  8    Period  Weeks    Status  Achieved      PT LONG TERM GOAL #5   Title  Patient will have a decrease in bilateral LE pain to 5/10 or less at worst to promote ability to perform functional tasks.     Baseline  8/10 at worst (07/29/2016); Minimal burning sensation bilateral knee and legs, no pain level number provided. Pt also states feeling pain in her knees during the weekend. No pain level provided (09/07/2016); 3/10 L leg burning sensation at most for the past 7 days (09/28/2016); bilateral knee burning sensation 8/10 at worst (10/29/2016); 7-8/10 bilateral knee pain, burning comes and goes (12/09/2016); 7-8/10 R knee, 5/10 L knee pain at most for the past 7 days (01/14/2017); 6-7/10 burning in L lower leg (04/12/17); 1-2/10 (05/18/2017)    Time  8    Period  Weeks    Status  On-going    Target Date  07/15/17      PT LONG TERM GOAL #6   Title  Pt will improve seated R hip flexion strength to at least 3+/5 to promote ability to ambulate and perform standing tasks.     Baseline  2+/5 seated R hip flexion (09/28/2016), (10/29/2016); (12/09/2016), (01/14/2017), (04/12/17); 2+/5 (05/20/2017)    Time  8    Period  Weeks    Status  On-going    Target Date  07/15/17      PT LONG TERM GOAL #7   Title  Pt will improve her LEFS score to 40/80 or more as a demonstration of improved  function.     Baseline  31/80 (12/09/2016); 18/80, pt however states increased bilateral knee pain recently (01/14/2017); 20/80 (04/12/17), (05/20/2017)    Time  8    Period  Weeks     Status  On-going    Target Date  07/15/17            Plan - 06/24/17 1514    Clinical Impression Statement  Continues to require intermittent cueing for maintaining core stabilization, but good awareness of technique with exercises when cued. Notes fatigue post session, but tolerable. Pt happy to be able to move more and feels exercise in supported environment of water, as she is unable to do such things on land.     Rehab Potential  Fair    Clinical Impairments Affecting Rehab Potential  Chronicity of condition, posible decreasing motivation (observed)    PT Frequency  2x / week    PT Duration  8 weeks    PT Treatment/Interventions  Electrical Stimulation;Aquatic Therapy;Ultrasound;Gait training;Functional mobility training;Therapeutic activities;Therapeutic exercise;Balance training;Neuromuscular re-education;Patient/family education;Manual techniques;Dry needling;Iontophoresis 4mg /ml Dexamethasone    PT Next Visit Plan  LE strengthening, modalities PRN    Consulted and Agree with Plan of Care  Patient       Patient will benefit from skilled therapeutic intervention in order to improve the following deficits and impairments:  Pain, Abnormal gait, Decreased balance, Decreased range of motion, Decreased strength, Difficulty walking  Visit Diagnosis: Muscle weakness (generalized)  Difficulty in walking, not elsewhere classified  Left knee pain, unspecified chronicity  Right knee pain, unspecified chronicity  Pain in left leg  Pain in right leg  Unsteadiness on feet  Weakness of both legs  Abnormality of gait  Decreased ROM of lumbar spine  Unsteadiness  Joint stiffness of spine  Bilateral low back pain without sciatica, unspecified chronicity  Other abnormalities of gait and mobility     Problem List Patient Active Problem List   Diagnosis Date Noted  . Weakness 11/03/2012    Larae Grooms 06/24/2017, 3:18 PM  [place] MAIN [facility] SERVICES [address] [place], [place], [address] Phone: [phone]   Fax:  [phone]  Name: [name] MRN: [id number] Date of Birth: 11-18-1945

## 2017-06-29 ENCOUNTER — Ambulatory Visit: Payer: Medicare Other

## 2017-06-29 ENCOUNTER — Other Ambulatory Visit: Payer: Self-pay

## 2017-06-29 DIAGNOSIS — M25561 Pain in right knee: Secondary | ICD-10-CM | POA: Diagnosis not present

## 2017-06-29 DIAGNOSIS — M25562 Pain in left knee: Secondary | ICD-10-CM | POA: Diagnosis not present

## 2017-06-29 DIAGNOSIS — M79604 Pain in right leg: Secondary | ICD-10-CM | POA: Diagnosis not present

## 2017-06-29 DIAGNOSIS — M6281 Muscle weakness (generalized): Secondary | ICD-10-CM | POA: Diagnosis not present

## 2017-06-29 DIAGNOSIS — R2689 Other abnormalities of gait and mobility: Secondary | ICD-10-CM

## 2017-06-29 DIAGNOSIS — R262 Difficulty in walking, not elsewhere classified: Secondary | ICD-10-CM | POA: Diagnosis not present

## 2017-06-29 DIAGNOSIS — M545 Low back pain, unspecified: Secondary | ICD-10-CM

## 2017-06-29 DIAGNOSIS — R29898 Other symptoms and signs involving the musculoskeletal system: Secondary | ICD-10-CM

## 2017-06-29 DIAGNOSIS — M5386 Other specified dorsopathies, lumbar region: Secondary | ICD-10-CM

## 2017-06-29 DIAGNOSIS — M79605 Pain in left leg: Secondary | ICD-10-CM

## 2017-06-29 DIAGNOSIS — R269 Unspecified abnormalities of gait and mobility: Secondary | ICD-10-CM

## 2017-06-29 DIAGNOSIS — M2560 Stiffness of unspecified joint, not elsewhere classified: Secondary | ICD-10-CM

## 2017-06-29 DIAGNOSIS — R2681 Unsteadiness on feet: Secondary | ICD-10-CM

## 2017-06-29 NOTE — Therapy (Signed)
[place] MAIN [facility] SERVICES [address] [place], [place], [address] Phone: [phone]   Fax:  [phone]  Physical Therapy Treatment  Patient Details  Name: [name] MRN: [id number] Date of Birth: 01/19/1946 Referring Provider: Shary Decamp, [place]   Encounter Date: 06/29/2017  PT End of Session - 06/29/17 1415    Visit Number  44    Number of Visits  81    Date for PT Re-Evaluation  07/15/17    PT Start Time  6962    PT Stop Time  1150    PT Time Calculation (min)  70 min    Activity Tolerance  Patient tolerated treatment well    Behavior During Therapy  [facility] for tasks assessed/performed       Past Medical History:  Diagnosis Date  . Arthritis   . Depression     Past Surgical History:  Procedure Laterality Date  . ABDOMINAL HYSTERECTOMY  1995  . back sugery    . BUNIONECTOMY  2013   rt foot  . COLONOSCOPY    . MUSCLE BIOPSY Left 10/03/2012   Procedure: LEFT QUADRICEP MUSCLE BIOPSY;  Surgeon: Odis Hollingshead, MD;  Location: [place];  Service: General;  Laterality: Left;  . NECK SURGERY  2010   cerv disc fused     There were no vitals filed for this visit.  Subjective Assessment - 06/29/17 1411    Subjective  Pt reports no issues post last session; denies pain currently. Feels she is tolerating sessions well and pleased with activity ability/tolerance in the water. Continues to note R knee swelling and weakness as compared to L.     Pertinent History  LE weakness.  Symptoms occured suddenly, unknown method of injury prior to her first neck fusion surgery on January 2010. Had lower back surgery fusion in 2011.  The neck and back surgeries did not help. Pt states having increased urinary urgency and takes medication for for it. MD aware.  Denies saddle anesthesia.  Pt states that her doctor told her that PT is the only thing that is going to help her keep moving so she continues to participate in PT.   Last round of PT was last year which helped.  Currently has difficulty walking, performing chores (wash dishes, laundry), cooking. Better able to do her tasks a little bit when she does therapy but gets harder when she stops.  Feels burning and stinging in both her knees, and bilateral anterior and lateral legs.  Pt states not having back or neck pain. Just a stiff neck.  Pt states she usually walks with a cane on her R side. No falls within the last 6 months.        Enters/exit via ramp Participates in the following   Ambulation, blue dumbbells for support  4 L fwd  2 L side  2 L side step with squat  Core stabilization with UE work  Red dumbbells, 2 x 10 each   Triceps press down   Sh abd/add   Sh flex/ext    Sh horiz abd/add  Bench core/LE  Noodle   Hip ext, single side at a time, B 2 x 10 ea   Up and outs, (SLR, return, hip abd return) single side at a time, B 2 x 10 ea   STS no UE, 20x   Bike, 3 min  Balance work at step  Alternating toe taps, fwd, 1 min, 1 UE light  touch  Side ipsilateral toe tap, B, 1 min ea, light UE touch  Stretching at step, B 5 x 15 sec ea  Hamstrings  Hip flexors  Walking cool down                              PT Education - 06/29/17 1413    Education provided  Yes    Education Details  Balance activity at step. Bench core/LE work Up and outs (SLR to abd). Hip ext with noodle for resist          PT Long Term Goals - 05/20/17 1500      PT LONG TERM GOAL #1   Title  Pt will be independent with her HEP to promote LE strength in function.    Time  8    Period  Weeks    Status  On-going    Target Date  07/15/17      PT LONG TERM GOAL #2   Title  Patient will improve bilateral LE strength by at least 1/2 MMT grade to promote ability to ambulate and perform functional tasks.     Time  8    Period  Weeks    Status  Achieved      PT LONG TERM GOAL #3   Title  Patient will improve her LEFS score by at least 9  points as a demonstration of improved function.     Baseline  14/80 (07/29/2016); 18/80 (10/29/2016); 31/80 (12/09/2016); 18/80, pt however states increased bilateral knee pain recently (01/14/2017); 20/80 (04/12/17); 20/80 (05/20/2017)    Time  8    Period  Weeks    Status  On-going    Target Date  07/15/17      PT LONG TERM GOAL #4   Title  Pt will report being able to walk/stand with SPC over 35 min to promote mobility.     Baseline  Pt states being able to walk for about 30 min (07/29/2016); about 1 hour per pt reports (09/07/2016)    Time  8    Period  Weeks    Status  Achieved      PT LONG TERM GOAL #5   Title  Patient will have a decrease in bilateral LE pain to 5/10 or less at worst to promote ability to perform functional tasks.     Baseline  8/10 at worst (07/29/2016); Minimal burning sensation bilateral knee and legs, no pain level number provided. Pt also states feeling pain in her knees during the weekend. No pain level provided (09/07/2016); 3/10 L leg burning sensation at most for the past 7 days (09/28/2016); bilateral knee burning sensation 8/10 at worst (10/29/2016); 7-8/10 bilateral knee pain, burning comes and goes (12/09/2016); 7-8/10 R knee, 5/10 L knee pain at most for the past 7 days (01/14/2017); 6-7/10 burning in L lower leg (04/12/17); 1-2/10 (05/18/2017)    Time  8    Period  Weeks    Status  On-going    Target Date  07/15/17      PT LONG TERM GOAL #6   Title  Pt will improve seated R hip flexion strength to at least 3+/5 to promote ability to ambulate and perform standing tasks.     Baseline  2+/5 seated R hip flexion (09/28/2016), (10/29/2016); (12/09/2016), (01/14/2017), (04/12/17); 2+/5 (05/20/2017)    Time  8    Period  Weeks    Status  On-going  Target Date  07/15/17      PT LONG TERM GOAL #7   Title  Pt will improve her LEFS score to 40/80 or more as a demonstration of improved function.     Baseline  31/80 (12/09/2016); 18/80, pt however states increased bilateral knee pain  recently (01/14/2017); 20/80 (04/12/17), (05/20/2017)    Time  8    Period  Weeks    Status  On-going    Target Date  07/15/17            Plan - 06/29/17 1415    Clinical Impression Statement  Pt tolerated session well with increased time for additional water walking/stretching. Tolerates new exercises well and demonstrating good challenge position with  core stabilization and UE work.     Rehab Potential  Fair    Clinical Impairments Affecting Rehab Potential  Chronicity of condition, posible decreasing motivation (observed)    PT Frequency  2x / week    PT Duration  8 weeks    PT Treatment/Interventions  Electrical Stimulation;Aquatic Therapy;Ultrasound;Gait training;Functional mobility training;Therapeutic activities;Therapeutic exercise;Balance training;Neuromuscular re-education;Patient/family education;Manual techniques;Dry needling;Iontophoresis 4mg /ml Dexamethasone    PT Next Visit Plan  LE strengthening, modalities PRN    Consulted and Agree with Plan of Care  Patient       Patient will benefit from skilled therapeutic intervention in order to improve the following deficits and impairments:  Pain, Abnormal gait, Decreased balance, Decreased range of motion, Decreased strength, Difficulty walking  Visit Diagnosis: Muscle weakness (generalized)  Difficulty in walking, not elsewhere classified  Right knee pain, unspecified chronicity  Left knee pain, unspecified chronicity  Pain in left leg  Pain in right leg  Unsteadiness on feet  Weakness of both legs  Abnormality of gait  Decreased ROM of lumbar spine  Unsteadiness  Joint stiffness of spine  Bilateral low back pain without sciatica, unspecified chronicity  Other abnormalities of gait and mobility     Problem List Patient Active Problem List   Diagnosis Date Noted  . Weakness 11/03/2012    [name] 06/29/2017, 2:18 PM  [place] MAIN [facility] SERVICES [address] [place], [place], [address] Phone: [phone]   Fax:  [phone]  Name: [name] MRN: [id number] Date of Birth: [birth year]

## 2017-07-01 ENCOUNTER — Ambulatory Visit: Payer: Medicare Other

## 2017-07-01 ENCOUNTER — Other Ambulatory Visit: Payer: Self-pay

## 2017-07-01 DIAGNOSIS — M79605 Pain in left leg: Secondary | ICD-10-CM

## 2017-07-01 DIAGNOSIS — M79604 Pain in right leg: Secondary | ICD-10-CM | POA: Diagnosis not present

## 2017-07-01 DIAGNOSIS — M25562 Pain in left knee: Secondary | ICD-10-CM

## 2017-07-01 DIAGNOSIS — M545 Low back pain, unspecified: Secondary | ICD-10-CM

## 2017-07-01 DIAGNOSIS — R2681 Unsteadiness on feet: Secondary | ICD-10-CM

## 2017-07-01 DIAGNOSIS — R262 Difficulty in walking, not elsewhere classified: Secondary | ICD-10-CM

## 2017-07-01 DIAGNOSIS — R269 Unspecified abnormalities of gait and mobility: Secondary | ICD-10-CM

## 2017-07-01 DIAGNOSIS — R29898 Other symptoms and signs involving the musculoskeletal system: Secondary | ICD-10-CM

## 2017-07-01 DIAGNOSIS — M2560 Stiffness of unspecified joint, not elsewhere classified: Secondary | ICD-10-CM

## 2017-07-01 DIAGNOSIS — R2689 Other abnormalities of gait and mobility: Secondary | ICD-10-CM

## 2017-07-01 DIAGNOSIS — M5386 Other specified dorsopathies, lumbar region: Secondary | ICD-10-CM

## 2017-07-01 DIAGNOSIS — M25561 Pain in right knee: Secondary | ICD-10-CM | POA: Diagnosis not present

## 2017-07-01 DIAGNOSIS — M6281 Muscle weakness (generalized): Secondary | ICD-10-CM

## 2017-07-01 NOTE — Therapy (Signed)
[place] MAIN [facility] SERVICES [address] [place], [place], [address] Phone: [phone]   Fax:  [phone]  Physical Therapy Treatment  Patient Details  Name: [name] MRN: [id number] Date of Birth: 1946/02/04 Referring Provider: Shary Decamp, [place]   Encounter Date: 07/01/2017  PT End of Session - 07/01/17 1642    Visit Number  45    Number of Visits  79    Date for PT Re-Evaluation  07/15/17    PT Start Time  1130    PT Stop Time  1235    PT Time Calculation (min)  65 min    Activity Tolerance  Patient tolerated treatment well    Behavior During Therapy  [facility] for tasks assessed/performed       Past Medical History:  Diagnosis Date  . Arthritis   . Depression     Past Surgical History:  Procedure Laterality Date  . ABDOMINAL HYSTERECTOMY  1995  . back sugery    . BUNIONECTOMY  2013   rt foot  . COLONOSCOPY    . MUSCLE BIOPSY Left 10/03/2012   Procedure: LEFT QUADRICEP MUSCLE BIOPSY;  Surgeon: Odis Hollingshead, MD;  Location: [place];  Service: General;  Laterality: Left;  . NECK SURGERY  2010   cerv disc fused     There were no vitals filed for this visit.  Subjective Assessment - 07/01/17 1639    Subjective  Pt continues to note RLE weakness and knee pain with swelling. Pt also notes L knee pain today ( pt notes this is intermittent day to day). Pt is tolerating sessions well and does not notes increased symptoms post session.     Pertinent History  LE weakness.  Symptoms occured suddenly, unknown method of injury prior to her first neck fusion surgery on January 2010. Had lower back surgery fusion in 2011.  The neck and back surgeries did not help. Pt states having increased urinary urgency and takes medication for for it. MD aware.  Denies saddle anesthesia.  Pt states that her doctor told her that PT is the only thing that is going to help her keep moving so she continues to participate in PT.   Last round of PT was last year which helped.  Currently has difficulty walking, performing chores (wash dishes, laundry), cooking. Better able to do her tasks a little bit when she does therapy but gets harder when she stops.  Feels burning and stinging in both her knees, and bilateral anterior and lateral legs.  Pt states not having back or neck pain. Just a stiff neck.  Pt states she usually walks with a cane on her R side. No falls within the last 6 months.        Enters/exits via ramp Participates in the following  Ambulation with blue dumbbells for postural support  Fwd 4 L  Side 2 L   Side with squat 2 L  Suspended position with noodle in front/HHA and or wall  jack  ski                    Increased time to educate/acclimate. Proves difficult for all  jog   Core with UE work using Mitts, 30x ea  Sh horiz abd/add  sh abd/add  sh flex/ext Green dumbbells  Triceps press down, 20x  Bench core/LE, 3 min ea  Bike  Scissor  Flutter  Core with LE work at rail, 25x ea  hip abd/add  hip flex/ext                             PT Education - 07/01/17 1641    Education provided  Yes    Education Details  core with UE work using mitts; suspended position exercises.    Person(s) Educated  Patient          PT Long Term Goals - 05/20/17 1500      PT LONG TERM GOAL #1   Title  Pt will be independent with her HEP to promote LE strength in function.    Time  8    Period  Weeks    Status  On-going    Target Date  07/15/17      PT LONG TERM GOAL #2   Title  Patient will improve bilateral LE strength by at least 1/2 MMT grade to promote ability to ambulate and perform functional tasks.     Time  8    Period  Weeks    Status  Achieved      PT LONG TERM GOAL #3   Title  Patient will improve her LEFS score by at least 9 points as a demonstration of improved function.     Baseline  14/80 (07/29/2016); 18/80 (10/29/2016); 31/80 (12/09/2016); 18/80, pt however  states increased bilateral knee pain recently (01/14/2017); 20/80 (04/12/17); 20/80 (05/20/2017)    Time  8    Period  Weeks    Status  On-going    Target Date  07/15/17      PT LONG TERM GOAL #4   Title  Pt will report being able to walk/stand with SPC over 35 min to promote mobility.     Baseline  Pt states being able to walk for about 30 min (07/29/2016); about 1 hour per pt reports (09/07/2016)    Time  8    Period  Weeks    Status  Achieved      PT LONG TERM GOAL #5   Title  Patient will have a decrease in bilateral LE pain to 5/10 or less at worst to promote ability to perform functional tasks.     Baseline  8/10 at worst (07/29/2016); Minimal burning sensation bilateral knee and legs, no pain level number provided. Pt also states feeling pain in her knees during the weekend. No pain level provided (09/07/2016); 3/10 L leg burning sensation at most for the past 7 days (09/28/2016); bilateral knee burning sensation 8/10 at worst (10/29/2016); 7-8/10 bilateral knee pain, burning comes and goes (12/09/2016); 7-8/10 R knee, 5/10 L knee pain at most for the past 7 days (01/14/2017); 6-7/10 burning in L lower leg (04/12/17); 1-2/10 (05/18/2017)    Time  8    Period  Weeks    Status  On-going    Target Date  07/15/17      PT LONG TERM GOAL #6   Title  Pt will improve seated R hip flexion strength to at least 3+/5 to promote ability to ambulate and perform standing tasks.     Baseline  2+/5 seated R hip flexion (09/28/2016), (10/29/2016); (12/09/2016), (01/14/2017), (04/12/17); 2+/5 (05/20/2017)    Time  8    Period  Weeks    Status  On-going    Target Date  07/15/17      PT LONG TERM GOAL #7   Title  Pt will improve her LEFS score to 40/80 or more as a  demonstration of improved function.     Baseline  31/80 (12/09/2016); 18/80, pt however states increased bilateral knee pain recently (01/14/2017); 20/80 (04/12/17), (05/20/2017)    Time  8    Period  Weeks    Status  On-going    Target Date  07/15/17             Plan - 07/01/17 1642    Clinical Impression Statement  pt had some difficulty fully comprehending/trusting suspended position despite use of noodle and HHA, but willing to work with position. Demonstrates good tolerance of other exercises this session tolerating with brief rest periods as needed. Pt eager to conintinue, as pt want to be able to performs tolerable exercises on a continued basis. Aquatic environment is going to be the best environment. Did discuss and educate pt on use of a recombent elliptical; pt would benefit from trying during land session.     Rehab Potential  Fair    Clinical Impairments Affecting Rehab Potential  Chronicity of condition, posible decreasing motivation (observed)    PT Frequency  2x / week    PT Duration  8 weeks    PT Treatment/Interventions  Electrical Stimulation;Aquatic Therapy;Ultrasound;Gait training;Functional mobility training;Therapeutic activities;Therapeutic exercise;Balance training;Neuromuscular re-education;Patient/family education;Manual techniques;Dry needling;Iontophoresis 4mg /ml Dexamethasone    PT Next Visit Plan  LE strengthening, modalities PRN    Consulted and Agree with Plan of Care  Patient       Patient will benefit from skilled therapeutic intervention in order to improve the following deficits and impairments:  Pain, Abnormal gait, Decreased balance, Decreased range of motion, Decreased strength, Difficulty walking  Visit Diagnosis: Muscle weakness (generalized)  Difficulty in walking, not elsewhere classified  Right knee pain, unspecified chronicity  Left knee pain, unspecified chronicity  Pain in left leg  Pain in right leg  Unsteadiness on feet  Weakness of both legs  Abnormality of gait  Decreased ROM of lumbar spine  Unsteadiness  Joint stiffness of spine  Bilateral low back pain without sciatica, unspecified chronicity  Other abnormalities of gait and mobility     Problem List Patient  Active Problem List   Diagnosis Date Noted  . Weakness 11/03/2012    Larae Grooms 07/01/2017, 4:47 PM  [place] MAIN [facility] SERVICES [address] [place], [place], [address] Phone: [phone]   Fax:  [phone]  Name: [name] MRN: [id number] Date of Birth: 08-Jan-1946

## 2017-07-06 ENCOUNTER — Encounter: Payer: Self-pay | Admitting: Physical Therapy

## 2017-07-06 ENCOUNTER — Ambulatory Visit: Payer: Medicare Other | Admitting: Physical Therapy

## 2017-07-06 ENCOUNTER — Encounter

## 2017-07-06 DIAGNOSIS — M6281 Muscle weakness (generalized): Secondary | ICD-10-CM | POA: Diagnosis not present

## 2017-07-06 DIAGNOSIS — M79605 Pain in left leg: Secondary | ICD-10-CM | POA: Diagnosis not present

## 2017-07-06 DIAGNOSIS — M25562 Pain in left knee: Secondary | ICD-10-CM | POA: Diagnosis not present

## 2017-07-06 DIAGNOSIS — M25561 Pain in right knee: Secondary | ICD-10-CM | POA: Diagnosis not present

## 2017-07-06 DIAGNOSIS — R262 Difficulty in walking, not elsewhere classified: Secondary | ICD-10-CM | POA: Diagnosis not present

## 2017-07-06 DIAGNOSIS — M79604 Pain in right leg: Secondary | ICD-10-CM | POA: Diagnosis not present

## 2017-07-06 NOTE — Therapy (Signed)
[place] MAIN [facility] SERVICES [address] [place], [place], [address] Phone: [phone]   Fax:  [phone]  Physical Therapy Treatment  Patient Details  Name: [name] MRN: [id number] Date of Birth: 1946/01/27 Referring Provider: Shary Decamp, [place]   Encounter Date: 07/06/2017  PT End of Session - 07/06/17 1312    Visit Number  46    Number of Visits  81    Date for PT Re-Evaluation  07/15/17    PT Start Time  1130    PT Stop Time  1215    PT Time Calculation (min)  45 min    Activity Tolerance  Patient tolerated treatment well    Behavior During Therapy  [facility] for tasks assessed/performed       Past Medical History:  Diagnosis Date  . Arthritis   . Depression     Past Surgical History:  Procedure Laterality Date  . ABDOMINAL HYSTERECTOMY  1995  . back sugery    . BUNIONECTOMY  2013   rt foot  . COLONOSCOPY    . MUSCLE BIOPSY Left 10/03/2012   Procedure: LEFT QUADRICEP MUSCLE BIOPSY;  Surgeon: Odis Hollingshead, MD;  Location: [place];  Service: General;  Laterality: Left;  . NECK SURGERY  2010   cerv disc fused     There were no vitals filed for this visit.  Subjective Assessment - 07/06/17 1309    Subjective  Pt reported continued swelling in RLE but reports increased weakness in LLE today.  Pt stated some increased difficulty walking today but attributed it to "nerves" from increased traffic and congestion on highway on the way to session this am.  Denies falls.    Pertinent History  LE weakness.  Symptoms occured suddenly, unknown method of injury prior to her first neck fusion surgery on January 2010. Had lower back surgery fusion in 2011.  The neck and back surgeries did not help. Pt states having increased urinary urgency and takes medication for for it. MD aware.  Denies saddle anesthesia.  Pt states that her doctor told her that PT is the only thing that is going to help her keep moving so she  continues to participate in PT.  Last round of PT was last year which helped.  Currently has difficulty walking, performing chores (wash dishes, laundry), cooking. Better able to do her tasks a little bit when she does therapy but gets harder when she stops.  Feels burning and stinging in both her knees, and bilateral anterior and lateral legs.  Pt states not having back or neck pain. Just a stiff neck.  Pt states she usually walks with a cane on her R side. No falls within the last 6 months.      Currently in Pain?  No/denies          Physical Therapy Aquatic Session  Enters via stairs per her choice.  Encouraged to use ramp on the way out for safety. Participates in the following:  Ambulation with blue dumbbells for postural support FWD 4 L Side 4 L  Core with UE work using mitts x 20 Shld horizontal abd/add [employer] flex/ext Blue dumbbells triceps press down x 20  Bench core/LE 3 min each Bike Scissor Fl[place]  Core work at rail x 25 each Hi ab/add Hip flex/ext Up and outs - SLR then return Abd then return  PT Education - 07/06/17 1312    Education provided  Yes    Education Details  Exercise techniques     Person(s) Educated  Patient    Methods  Explanation;Demonstration;Tactile cues;Verbal cues    Comprehension  Verbalized understanding;Returned demonstration          PT Long Term Goals - 05/20/17 1500      PT LONG TERM GOAL #1   Title  Pt will be independent with her HEP to promote LE strength in function.    Time  8    Period  Weeks    Status  On-going    Target Date  07/15/17      PT LONG TERM GOAL #2   Title  Patient will improve bilateral LE strength by at least 1/2 MMT grade to promote ability to ambulate and perform functional tasks.     Time  8    Period  Weeks    Status  Achieved      PT LONG TERM GOAL #3   Title  Patient will improve her LEFS score by at least 9 points as a demonstration of improved  function.     Baseline  14/80 (07/29/2016); 18/80 (10/29/2016); 31/80 (12/09/2016); 18/80, pt however states increased bilateral knee pain recently (01/14/2017); 20/80 (04/12/17); 20/80 (05/20/2017)    Time  8    Period  Weeks    Status  On-going    Target Date  07/15/17      PT LONG TERM GOAL #4   Title  Pt will report being able to walk/stand with SPC over 35 min to promote mobility.     Baseline  Pt states being able to walk for about 30 min (07/29/2016); about 1 hour per pt reports (09/07/2016)    Time  8    Period  Weeks    Status  Achieved      PT LONG TERM GOAL #5   Title  Patient will have a decrease in bilateral LE pain to 5/10 or less at worst to promote ability to perform functional tasks.     Baseline  8/10 at worst (07/29/2016); Minimal burning sensation bilateral knee and legs, no pain level number provided. Pt also states feeling pain in her knees during the weekend. No pain level provided (09/07/2016); 3/10 L leg burning sensation at most for the past 7 days (09/28/2016); bilateral knee burning sensation 8/10 at worst (10/29/2016); 7-8/10 bilateral knee pain, burning comes and goes (12/09/2016); 7-8/10 R knee, 5/10 L knee pain at most for the past 7 days (01/14/2017); 6-7/10 burning in L lower leg (04/12/17); 1-2/10 (05/18/2017)    Time  8    Period  Weeks    Status  On-going    Target Date  07/15/17      PT LONG TERM GOAL #6   Title  Pt will improve seated R hip flexion strength to at least 3+/5 to promote ability to ambulate and perform standing tasks.     Baseline  2+/5 seated R hip flexion (09/28/2016), (10/29/2016); (12/09/2016), (01/14/2017), (04/12/17); 2+/5 (05/20/2017)    Time  8    Period  Weeks    Status  On-going    Target Date  07/15/17      PT LONG TERM GOAL #7   Title  Pt will improve her LEFS score to 40/80 or more as a demonstration of improved function.     Baseline  31/80 (12/09/2016); 18/80, pt however states increased bilateral knee pain recently (01/14/2017); 20/80  (  04/12/17), (05/20/2017)    Time  8    Period  Weeks    Status  On-going    Target Date  07/15/17            Plan - 07/06/17 1316    Clinical Impression Statement  Good tolerance of exercises today requiring little to no rest breaks this session.  Suspended exercises deferred tis session.      Clinical Presentation  Stable    Rehab Potential  Fair    Clinical Impairments Affecting Rehab Potential  Chronicity of condition, seemed motivated to participate and to continue on her own once discharged.    PT Frequency  2x / week    PT Duration  8 weeks    PT Treatment/Interventions  Electrical Stimulation;Aquatic Therapy;Ultrasound;Gait training;Functional mobility training;Therapeutic activities;Therapeutic exercise;Balance training;Neuromuscular re-education;Patient/family education;Manual techniques;Dry needling;Iontophoresis 4mg /ml Dexamethasone    PT Next Visit Plan  LE strengthening, modalities PRN    Consulted and Agree with Plan of Care  Patient       Patient will benefit from skilled therapeutic intervention in order to improve the following deficits and impairments:  Pain, Abnormal gait, Decreased balance, Decreased range of motion, Decreased strength, Difficulty walking  Visit Diagnosis: No diagnosis found.     Problem List Patient Active Problem List   Diagnosis Date Noted  . Weakness 11/03/2012    Chesley Noon 07/06/2017, 1:20 PM  [facility] MAIN [facility] SERVICES [address] [place], [place], [address] Phone: [phone]   Fax:  [phone]  Name: [name] MRN: [id number] Date of Birth: 1945/10/09

## 2017-07-08 ENCOUNTER — Ambulatory Visit: Payer: Medicare Other

## 2017-07-08 DIAGNOSIS — R2681 Unsteadiness on feet: Secondary | ICD-10-CM

## 2017-07-08 DIAGNOSIS — M79604 Pain in right leg: Secondary | ICD-10-CM | POA: Diagnosis not present

## 2017-07-08 DIAGNOSIS — R2689 Other abnormalities of gait and mobility: Secondary | ICD-10-CM

## 2017-07-08 DIAGNOSIS — M6281 Muscle weakness (generalized): Secondary | ICD-10-CM | POA: Diagnosis not present

## 2017-07-08 DIAGNOSIS — M79605 Pain in left leg: Secondary | ICD-10-CM | POA: Diagnosis not present

## 2017-07-08 DIAGNOSIS — R262 Difficulty in walking, not elsewhere classified: Secondary | ICD-10-CM | POA: Diagnosis not present

## 2017-07-08 DIAGNOSIS — M5386 Other specified dorsopathies, lumbar region: Secondary | ICD-10-CM

## 2017-07-08 DIAGNOSIS — R269 Unspecified abnormalities of gait and mobility: Secondary | ICD-10-CM

## 2017-07-08 DIAGNOSIS — R29898 Other symptoms and signs involving the musculoskeletal system: Secondary | ICD-10-CM

## 2017-07-08 DIAGNOSIS — M25562 Pain in left knee: Secondary | ICD-10-CM

## 2017-07-08 DIAGNOSIS — M545 Low back pain, unspecified: Secondary | ICD-10-CM

## 2017-07-08 DIAGNOSIS — M25561 Pain in right knee: Secondary | ICD-10-CM | POA: Diagnosis not present

## 2017-07-08 DIAGNOSIS — M2560 Stiffness of unspecified joint, not elsewhere classified: Secondary | ICD-10-CM

## 2017-07-12 ENCOUNTER — Other Ambulatory Visit: Payer: Self-pay

## 2017-07-12 NOTE — Therapy (Signed)
[place] MAIN [facility] SERVICES [address] [place], [place], [address] Phone: [phone]   Fax:  [phone]  Physical Therapy Treatment  Patient Details  Name: [name] MRN: [id number] Date of Birth: [birth year] Referring Provider: Shary Decamp, [place]   Encounter Date: 07/08/2017  PT End of Session - 07/12/17 1359    Visit Number  36    Number of Visits  81    Date for PT Re-Evaluation  07/15/17    PT Start Time  1130    PT Stop Time  1230    PT Time Calculation (min)  60 min    Activity Tolerance  Patient tolerated treatment well    Behavior During Therapy  [facility] for tasks assessed/performed       Past Medical History:  Diagnosis Date  . Arthritis   . Depression     Past Surgical History:  Procedure Laterality Date  . ABDOMINAL HYSTERECTOMY  1995  . back sugery    . BUNIONECTOMY  2013   rt foot  . COLONOSCOPY    . MUSCLE BIOPSY Left 10/03/2012   Procedure: LEFT QUADRICEP MUSCLE BIOPSY;  Surgeon: Odis Hollingshead, MD;  Location: [place];  Service: General;  Laterality: Left;  . NECK SURGERY  2010   cerv disc fused     There were no vitals filed for this visit.  Subjective Assessment - 07/12/17 1356    Subjective  Pt reports continued R knee swelling. C/o increased L knee pain today without known cause.     Pertinent History  LE weakness.  Symptoms occured suddenly, unknown method of injury prior to her first neck fusion surgery on January 2010. Had lower back surgery fusion in 2011.  The neck and back surgeries did not help. Pt states having increased urinary urgency and takes medication for for it. MD aware.  Denies saddle anesthesia.  Pt states that her doctor told her that PT is the only thing that is going to help her keep moving so she continues to participate in PT.  Last round of PT was last year which helped.  Currently has difficulty walking, performing chores (wash dishes, laundry), cooking.  Better able to do her tasks a little bit when she does therapy but gets harder when she stops.  Feels burning and stinging in both her knees, and bilateral anterior and lateral legs.  Pt states not having back or neck pain. Just a stiff neck.  Pt states she usually walks with a cane on her R side. No falls within the last 6 months.        Enters/exits via ramp Participates in the following  Ambulation, blue dumbbells  4 L fwd  4 L side  LE strength with core stab  Rail   Hip abd/add   Hip flex/ext   Modified plank series at rail, 3 x 20 sec ea  Fwd  Side, B  Core with UE strength, red dumbbells 2 x 10 ea. Sh's in water  Triceps press down  Sh abd/add  Sh flex/ext  Sh horizontal abd/add  Bench, core/LE  Bike, 3 min  Scissor, 3 min  SKTC, B 2 x 10   Hip ER circles, B 2 x 10   Stretching at step, 3 x 15 sec ea  ham/gastroc  hip flexors/quad                         PT Education -  07/12/17 1358    Education provided  Yes    Education Details  Modified plank series          PT Long Term Goals - 05/20/17 1500      PT LONG TERM GOAL #1   Title  Pt will be independent with her HEP to promote LE strength in function.    Time  8    Period  Weeks    Status  On-going    Target Date  07/15/17      PT LONG TERM GOAL #2   Title  Patient will improve bilateral LE strength by at least 1/2 MMT grade to promote ability to ambulate and perform functional tasks.     Time  8    Period  Weeks    Status  Achieved      PT LONG TERM GOAL #3   Title  Patient will improve her LEFS score by at least 9 points as a demonstration of improved function.     Baseline  14/80 (07/29/2016); 18/80 (10/29/2016); 31/80 (12/09/2016); 18/80, pt however states increased bilateral knee pain recently (01/14/2017); 20/80 (04/12/17); 20/80 (05/20/2017)    Time  8    Period  Weeks    Status  On-going    Target Date  07/15/17      PT LONG TERM GOAL #4   Title  Pt will report being  able to walk/stand with SPC over 35 min to promote mobility.     Baseline  Pt states being able to walk for about 30 min (07/29/2016); about 1 hour per pt reports (09/07/2016)    Time  8    Period  Weeks    Status  Achieved      PT LONG TERM GOAL #5   Title  Patient will have a decrease in bilateral LE pain to 5/10 or less at worst to promote ability to perform functional tasks.     Baseline  8/10 at worst (07/29/2016); Minimal burning sensation bilateral knee and legs, no pain level number provided. Pt also states feeling pain in her knees during the weekend. No pain level provided (09/07/2016); 3/10 L leg burning sensation at most for the past 7 days (09/28/2016); bilateral knee burning sensation 8/10 at worst (10/29/2016); 7-8/10 bilateral knee pain, burning comes and goes (12/09/2016); 7-8/10 R knee, 5/10 L knee pain at most for the past 7 days (01/14/2017); 6-7/10 burning in L lower leg (04/12/17); 1-2/10 (05/18/2017)    Time  8    Period  Weeks    Status  On-going    Target Date  07/15/17      PT LONG TERM GOAL #6   Title  Pt will improve seated R hip flexion strength to at least 3+/5 to promote ability to ambulate and perform standing tasks.     Baseline  2+/5 seated R hip flexion (09/28/2016), (10/29/2016); (12/09/2016), (01/14/2017), (04/12/17); 2+/5 (05/20/2017)    Time  8    Period  Weeks    Status  On-going    Target Date  07/15/17      PT LONG TERM GOAL #7   Title  Pt will improve her LEFS score to 40/80 or more as a demonstration of improved function.     Baseline  31/80 (12/09/2016); 18/80, pt however states increased bilateral knee pain recently (01/14/2017); 20/80 (04/12/17), (05/20/2017)    Time  8    Period  Weeks    Status  On-going    Target Date  07/15/17            Plan - 07/12/17 1400    Clinical Impression Statement  Pt tolerated session well without increase in L knee pain. Tolerated new exercises well with need for cues for proper technique/form with modified plank series     Rehab Potential  Fair    Clinical Impairments Affecting Rehab Potential  Chronicity of condition, seemed motivated to participate and to continue on her own once discharged.    PT Frequency  2x / week    PT Duration  8 weeks    PT Treatment/Interventions  Electrical Stimulation;Aquatic Therapy;Ultrasound;Gait training;Functional mobility training;Therapeutic activities;Therapeutic exercise;Balance training;Neuromuscular re-education;Patient/family education;Manual techniques;Dry needling;Iontophoresis 4mg /ml Dexamethasone    PT Next Visit Plan  LE strengthening, modalities PRN    Consulted and Agree with Plan of Care  Patient       Patient will benefit from skilled therapeutic intervention in order to improve the following deficits and impairments:  Pain, Abnormal gait, Decreased balance, Decreased range of motion, Decreased strength, Difficulty walking  Visit Diagnosis: Muscle weakness (generalized)  Difficulty in walking, not elsewhere classified  Right knee pain, unspecified chronicity  Left knee pain, unspecified chronicity  Pain in left leg  Pain in right leg  Unsteadiness on feet  Other abnormalities of gait and mobility  Bilateral low back pain without sciatica, unspecified chronicity  Joint stiffness of spine  Unsteadiness  Decreased ROM of lumbar spine  Abnormality of gait  Weakness of both legs     Problem List Patient Active Problem List   Diagnosis Date Noted  . Weakness 11/03/2012    Larae Grooms 07/12/2017, 2:03 PM  [place] MAIN [facility] SERVICES [address] [place], [place], [address] Phone: [phone]   Fax:  [phone]  Name: [name] MRN: [id number] Date of Birth: Sep 01, 1945

## 2017-07-13 ENCOUNTER — Ambulatory Visit: Payer: Medicare Other

## 2017-07-13 ENCOUNTER — Telehealth: Payer: Self-pay

## 2017-07-13 DIAGNOSIS — M25561 Pain in right knee: Secondary | ICD-10-CM | POA: Diagnosis not present

## 2017-07-13 DIAGNOSIS — R262 Difficulty in walking, not elsewhere classified: Secondary | ICD-10-CM | POA: Diagnosis not present

## 2017-07-13 DIAGNOSIS — M6281 Muscle weakness (generalized): Secondary | ICD-10-CM | POA: Diagnosis not present

## 2017-07-13 DIAGNOSIS — M79604 Pain in right leg: Secondary | ICD-10-CM

## 2017-07-13 DIAGNOSIS — M79605 Pain in left leg: Secondary | ICD-10-CM | POA: Diagnosis not present

## 2017-07-13 DIAGNOSIS — R29898 Other symptoms and signs involving the musculoskeletal system: Secondary | ICD-10-CM

## 2017-07-13 DIAGNOSIS — M545 Low back pain, unspecified: Secondary | ICD-10-CM

## 2017-07-13 DIAGNOSIS — M25562 Pain in left knee: Secondary | ICD-10-CM

## 2017-07-13 DIAGNOSIS — R2689 Other abnormalities of gait and mobility: Secondary | ICD-10-CM

## 2017-07-13 DIAGNOSIS — M2560 Stiffness of unspecified joint, not elsewhere classified: Secondary | ICD-10-CM

## 2017-07-13 DIAGNOSIS — R269 Unspecified abnormalities of gait and mobility: Secondary | ICD-10-CM

## 2017-07-13 DIAGNOSIS — R2681 Unsteadiness on feet: Secondary | ICD-10-CM

## 2017-07-13 DIAGNOSIS — M5386 Other specified dorsopathies, lumbar region: Secondary | ICD-10-CM

## 2017-07-13 NOTE — Telephone Encounter (Signed)
No show. Called patient and left a message pertaining to today's appointment and a reminder for her next follow up session. Return phone call requested. Phone number [phone])  provided.

## 2017-07-14 ENCOUNTER — Other Ambulatory Visit: Payer: Self-pay

## 2017-07-14 NOTE — Therapy (Signed)
[place] MAIN [facility] SERVICES [address] [place], [place], [address] Phone: [phone]   Fax:  [phone]  Physical Therapy Treatment  Patient Details  Name: [name] MRN: [id number] Date of Birth: 31-May-1945 Referring Provider: Shary Decamp, [place]   Encounter Date: 07/13/2017  PT End of Session - 07/14/17 1148    Visit Number  48    Number of Visits  81    Date for PT Re-Evaluation  07/15/17    PT Start Time  1200    PT Stop Time  1300    PT Time Calculation (min)  60 min    Activity Tolerance  Patient tolerated treatment well    Behavior During Therapy  [facility] for tasks assessed/performed       Past Medical History:  Diagnosis Date  . Arthritis   . Depression     Past Surgical History:  Procedure Laterality Date  . ABDOMINAL HYSTERECTOMY  1995  . back sugery    . BUNIONECTOMY  2013   rt foot  . COLONOSCOPY    . MUSCLE BIOPSY Left 10/03/2012   Procedure: LEFT QUADRICEP MUSCLE BIOPSY;  Surgeon: Odis Hollingshead, MD;  Location: [place];  Service: General;  Laterality: Left;  . NECK SURGERY  2010   cerv disc fused     There were no vitals filed for this visit.  Subjective Assessment - 07/14/17 1145    Subjective  Pt reports continued R knee swelling. LLE knee to ankle with burning sensation, R okay currently, but continues weak. Pt frustrated with the unknown of cause of LE issues and is considering second opinions and requesting re tests.     Pertinent History  LE weakness.  Symptoms occured suddenly, unknown method of injury prior to her first neck fusion surgery on January 2010. Had lower back surgery fusion in 2011.  The neck and back surgeries did not help. Pt states having increased urinary urgency and takes medication for for it. MD aware.  Denies saddle anesthesia.  Pt states that her doctor told her that PT is the only thing that is going to help her keep moving so she continues to participate in  PT.  Last round of PT was last year which helped.  Currently has difficulty walking, performing chores (wash dishes, laundry), cooking. Better able to do her tasks a little bit when she does therapy but gets harder when she stops.  Feels burning and stinging in both her knees, and bilateral anterior and lateral legs.  Pt states not having back or neck pain. Just a stiff neck.  Pt states she usually walks with a cane on her R side. No falls within the last 6 months.        Pt showed up at pool unexpectedly; pt felt she did not have land session today.  Enters/exits pool via ramp Participates in the following  Ambulation, blue dumbbells  4 L fwd  4 L side  Core stabilization with UE strengthening, 2 x 10 ea  Red dumbbells   Triceps press downs   Sh abd/add   Sh flex/ext   Sh horiz abd/add  Core stabilization with LE work at bench  1# ankle wts   [place]   SL with hip ER, outward circles   SLR up and outs  STS, no UE support, 25x  Step balance work  Alternating toe taps, light 1 UE support, 2 x 1 min  Step strengthening  Partial step  ups, R/L 20x ea  LE strength/balance, light touch on rail  Side lunges, B 20x ea  Fwd lunges, B 20x ea  Stretching, B, 3 x 10 sec ea  hamstrings/gastrocs  hip flexors/quads                          PT Education - 07/14/17 1147    Education provided  Yes    Education Details  Seated core progression exercises. Side and fwd lunges    Person(s) Educated  Patient    Methods  Explanation;Demonstration    Comprehension  Verbalized understanding;Returned demonstration;Verbal cues required;Tactile cues required          PT Long Term Goals - 05/20/17 1500      PT LONG TERM GOAL #1   Title  Pt will be independent with her HEP to promote LE strength in function.    Time  8    Period  Weeks    Status  On-going    Target Date  07/15/17      PT LONG TERM GOAL #2   Title  Patient will improve bilateral LE strength by at  least 1/2 MMT grade to promote ability to ambulate and perform functional tasks.     Time  8    Period  Weeks    Status  Achieved      PT LONG TERM GOAL #3   Title  Patient will improve her LEFS score by at least 9 points as a demonstration of improved function.     Baseline  14/80 (07/29/2016); 18/80 (10/29/2016); 31/80 (12/09/2016); 18/80, pt however states increased bilateral knee pain recently (01/14/2017); 20/80 (04/12/17); 20/80 (05/20/2017)    Time  8    Period  Weeks    Status  On-going    Target Date  07/15/17      PT LONG TERM GOAL #4   Title  Pt will report being able to walk/stand with SPC over 35 min to promote mobility.     Baseline  Pt states being able to walk for about 30 min (07/29/2016); about 1 hour per pt reports (09/07/2016)    Time  8    Period  Weeks    Status  Achieved      PT LONG TERM GOAL #5   Title  Patient will have a decrease in bilateral LE pain to 5/10 or less at worst to promote ability to perform functional tasks.     Baseline  8/10 at worst (07/29/2016); Minimal burning sensation bilateral knee and legs, no pain level number provided. Pt also states feeling pain in her knees during the weekend. No pain level provided (09/07/2016); 3/10 L leg burning sensation at most for the past 7 days (09/28/2016); bilateral knee burning sensation 8/10 at worst (10/29/2016); 7-8/10 bilateral knee pain, burning comes and goes (12/09/2016); 7-8/10 R knee, 5/10 L knee pain at most for the past 7 days (01/14/2017); 6-7/10 burning in L lower leg (04/12/17); 1-2/10 (05/18/2017)    Time  8    Period  Weeks    Status  On-going    Target Date  07/15/17      PT LONG TERM GOAL #6   Title  Pt will improve seated R hip flexion strength to at least 3+/5 to promote ability to ambulate and perform standing tasks.     Baseline  2+/5 seated R hip flexion (09/28/2016), (10/29/2016); (12/09/2016), (01/14/2017), (04/12/17); 2+/5 (05/20/2017)    Time  8  Period  Weeks    Status  On-going    Target Date   07/15/17      PT LONG TERM GOAL #7   Title  Pt will improve her LEFS score to 40/80 or more as a demonstration of improved function.     Baseline  31/80 (12/09/2016); 18/80, pt however states increased bilateral knee pain recently (01/14/2017); 20/80 (04/12/17), (05/20/2017)    Time  8    Period  Weeks    Status  On-going    Target Date  07/15/17            Plan - 07/14/17 1149    Clinical Impression Statement  Pt tolerating sessions well; pt eager about being able to tolerate greater activity in the water, but frustrated with the lack of feeling muscular initiation/work on the RLE. Progressing core strenthening. Continue to progress all strengthening activities and balance    Rehab Potential  Fair    Clinical Impairments Affecting Rehab Potential  Chronicity of condition, seemed motivated to participate and to continue on her own once discharged.    PT Frequency  2x / week    PT Duration  8 weeks    PT Treatment/Interventions  Electrical Stimulation;Aquatic Therapy;Ultrasound;Gait training;Functional mobility training;Therapeutic activities;Therapeutic exercise;Balance training;Neuromuscular re-education;Patient/family education;Manual techniques;Dry needling;Iontophoresis 4mg /ml Dexamethasone    PT Next Visit Plan  LE strengthening, modalities PRN    Consulted and Agree with Plan of Care  Patient       Patient will benefit from skilled therapeutic intervention in order to improve the following deficits and impairments:  Pain, Abnormal gait, Decreased balance, Decreased range of motion, Decreased strength, Difficulty walking  Visit Diagnosis: Muscle weakness (generalized)  Difficulty in walking, not elsewhere classified  Right knee pain, unspecified chronicity  Left knee pain, unspecified chronicity  Pain in left leg  Pain in right leg  Unsteadiness on feet  Weakness of both legs  Abnormality of gait  Decreased ROM of lumbar spine  Unsteadiness  Joint stiffness of  spine  Bilateral low back pain without sciatica, unspecified chronicity  Other abnormalities of gait and mobility     Problem List Patient Active Problem List   Diagnosis Date Noted  . Weakness 11/03/2012    Larae Grooms 07/14/2017, 11:51 AM  [place] MAIN [facility] SERVICES [address] [place], [place], [address] Phone: [phone]   Fax:  [phone]  Name: [name] MRN: [id number] Date of Birth: 05-17-1945

## 2017-07-15 ENCOUNTER — Ambulatory Visit: Payer: Medicare Other

## 2017-07-15 DIAGNOSIS — M79605 Pain in left leg: Secondary | ICD-10-CM

## 2017-07-15 DIAGNOSIS — R262 Difficulty in walking, not elsewhere classified: Secondary | ICD-10-CM | POA: Diagnosis not present

## 2017-07-15 DIAGNOSIS — M25561 Pain in right knee: Secondary | ICD-10-CM

## 2017-07-15 DIAGNOSIS — M25562 Pain in left knee: Secondary | ICD-10-CM | POA: Diagnosis not present

## 2017-07-15 DIAGNOSIS — M6281 Muscle weakness (generalized): Secondary | ICD-10-CM

## 2017-07-15 DIAGNOSIS — M79604 Pain in right leg: Secondary | ICD-10-CM

## 2017-07-15 NOTE — Therapy (Signed)
[place] [facility] [address], [place], [address] Phone: [phone]   Fax:  [phone]  Physical Therapy Treatment And Progress Report  Patient Details  Name: [name] MRN: [id number] Date of Birth: 1946-01-25 Referring Provider: Shary Decamp, [place]   Encounter Date: 07/15/2017  PT End of Session - 07/15/17 1307    Visit Number  49    Number of Visits  34    Date for PT Re-Evaluation  08/26/17    PT Start Time  1307    PT Stop Time  1348    PT Time Calculation (min)  41 min    Activity Tolerance  Patient tolerated treatment well    Behavior During Therapy  [facility] for tasks assessed/performed       Past Medical History:  Diagnosis Date  . Arthritis   . Depression     Past Surgical History:  Procedure Laterality Date  . ABDOMINAL HYSTERECTOMY  1995  . back sugery    . BUNIONECTOMY  2013   rt foot  . COLONOSCOPY    . MUSCLE BIOPSY Left 10/03/2012   Procedure: LEFT QUADRICEP MUSCLE BIOPSY;  Surgeon: Odis Hollingshead, MD;  Location: [place];  Service: General;  Laterality: Left;  . NECK SURGERY  2010   cerv disc fused     There were no vitals filed for this visit.  Subjective Assessment - 07/15/17 1309    Subjective  Pt states that she is tired of going to therapy. Feels like that is all she does. The pool therapy is good. Sometimes she feels like it helps. Has less pain in the pool. Enjoys it.  L knee and leg burning today, about a 5/10 currently at at most for the past 7 days.  No R knee and leg burning for the past 7 days.  Does not know what to do. Afraid to stop PT. Feels like she is getting stronger with the Aquatherapy.  Stopped taking medicine for bilateral leg burning sensation because it has calmed down a lot. Wants to continue with more aqua therapy.       Pertinent History  LE weakness.  Symptoms occured suddenly, unknown method of injury prior to her first neck fusion  surgery on January 2010. Had lower back surgery fusion in 2011.  The neck and back surgeries did not help. Pt states having increased urinary urgency and takes medication for for it. MD aware.  Denies saddle anesthesia.  Pt states that her doctor told her that PT is the only thing that is going to help her keep moving so she continues to participate in PT.  Last round of PT was last year which helped.  Currently has difficulty walking, performing chores (wash dishes, laundry), cooking. Better able to do her tasks a little bit when she does therapy but gets harder when she stops.  Feels burning and stinging in both her knees, and bilateral anterior and lateral legs.  Pt states not having back or neck pain. Just a stiff neck.  Pt states she usually walks with a cane on her R side. No falls within the last 6 months.      Currently in Pain?  Yes    Pain Score  5  L knee and leg burning.          [facility] PT Assessment - 07/15/17 1316      Observation/Other Assessments   Lower Extremity Functional Scale  26/80      Strength   Right Hip Flexion  2+/5                           PT Education - 07/15/17 1712    Education provided  Yes    Education Details  ther-ex, plan of care: continue aquatherapy    Person(s) Educated  Patient    Methods  Explanation;Demonstration;Tactile cues;Verbal cues    Comprehension  Returned demonstration;Verbalized understanding          Objectives  MedBridge Access Code: [email or web address]  Pt wearing her AFO R foot.   No follow up appt with clinic of her referring PA scheduled.   Stopped taking medicine for bilateral leg burning sensation because it has calmed down a lot. Wants to continue with more aqua therapy.   Therapeutic exercise  Reviewed progress with burning/pain level with PT  Seated R hip flexion 1x  Supine marches 10x4 each LE  Supine SLR R hip flexion 10x4 (first 5 repetitions with assistance then pt able to perform the rest  well independently.   S/L hip abduction 10x2 each  S/L clamshells 10x2 each LE  Reviewed plan of care: continue until 08/24/2017 predominantly aquatherapy  Sit <> stand from elevated mat table 2x with emphasis on femoral control.    Improved exercise technique, movement at target joints, use of target muscles after min to mod verbal, visual, tactile cues.    Able to perform supine R hip flexion SLR independently. Good activation of R hip flexor muscles felt by pt. Muscle contraction palpated around the 3-/5 seated R hip flexion range but pt unable to perform hip flexion indpendently into range. Pt also demonstrates overall decreased bilateral knee and leg discomfort, and slight improved ability to perform functional tasks. Difficulty with swing phase of gait secondary to weakness. Slight improvement with R hip flexion since participating in [place]. Pt will benefit from continued skilled physical therapy services to address the aforementioned deficits.       PT Long Term Goals - 07/15/17 1714      PT LONG TERM GOAL #1   Title  Pt will be independent with her HEP to promote LE strength in function.    Time  6    Period  Weeks    Status  On-going    Target Date  08/26/17      PT LONG TERM GOAL #2   Title  Patient will improve bilateral LE strength by at least 1/2 MMT grade to promote ability to ambulate and perform functional tasks.     Time  8    Period  Weeks    Status  Achieved      PT LONG TERM GOAL #3   Title  Patient will improve her LEFS score by at least 9 points as a demonstration of improved function.     Baseline  14/80 (07/29/2016); 18/80 (10/29/2016); 31/80 (12/09/2016); 18/80, pt however states increased bilateral knee pain recently (01/14/2017); 20/80 (04/12/17); 20/80 (05/20/2017); 26/80 (07/15/2017)    Time  8    Period  Weeks    Status  Achieved      PT LONG TERM GOAL #4   Title  Pt will report being able to walk/stand with SPC over 35 min to promote mobility.      Baseline  Pt states being able to walk for about 30 min (07/29/2016); about 1 hour per pt reports (09/07/2016)    Time  8    Period  Weeks    Status  Achieved      PT LONG TERM GOAL #5   Title  Patient will have a decrease in bilateral LE pain to 5/10 or less at worst to promote ability to perform functional tasks.     Baseline  8/10 at worst (07/29/2016); Minimal burning sensation bilateral knee and legs, no pain level number provided. Pt also states feeling pain in her knees during the weekend. No pain level provided (09/07/2016); 3/10 L leg burning sensation at most for the past 7 days (09/28/2016); bilateral knee burning sensation 8/10 at worst (10/29/2016); 7-8/10 bilateral knee pain, burning comes and goes (12/09/2016); 7-8/10 R knee, 5/10 L knee pain at most for the past 7 days (01/14/2017); 6-7/10 burning in L lower leg (04/12/17); 1-2/10 (05/18/2017); 5/10  L knee and leg burning at most , 0/10 R knee and leg burning at most for the past 7 days (07/15/2017)    Time  8    Period  Weeks    Status  Achieved      PT LONG TERM GOAL #6   Title  Pt will improve seated R hip flexion strength to at least 3+/5 to promote ability to ambulate and perform standing tasks.     Baseline  2+/5 seated R hip flexion (09/28/2016), (10/29/2016); (12/09/2016), (01/14/2017), (04/12/17); 2+/5 (05/20/2017), (07/15/2017)    Time  6    Period  Weeks    Status  On-going    Target Date  08/26/17      PT LONG TERM GOAL #7   Title  Pt will improve her LEFS score to 40/80 or more as a demonstration of improved function.     Baseline  31/80 (12/09/2016); 18/80, pt however states increased bilateral knee pain recently (01/14/2017); 20/80 (04/12/17), (05/20/2017); 26/80 (07/15/2016)    Time  6    Period  Weeks    Status  On-going    Target Date  08/26/17            Plan - 07/15/17 1306    Clinical Impression Statement  Able to perform supine R hip flexion SLR independently. Good activation of R hip flexor muscles felt by pt.  Muscle contraction palpated around the 3-/5 seated R hip flexion range but pt unable to perform hip flexion indpendently into range. Pt also demonstrates overall decreased bilateral knee and leg discomfort, and slight improved ability to perform functional tasks. Difficulty with swing phase of gait secondary to weakness. Slight improvement with R hip flexion since participating in [place]. Pt will benefit from continued skilled physical therapy services to address the aforementioned deficits.     History and Personal Factors relevant to plan of care:  Chronicity of condition, weakness    Clinical Presentation  Stable    Clinical Presentation due to:  Improved bilateral knee and leg pain, potential for R hip flexor strength observed during session    Clinical Decision Making  Low    Rehab Potential  Fair    Clinical Impairments Affecting Rehab Potential  Chronicity of condition    PT Frequency  2x / week    PT Duration  6 weeks    PT Treatment/Interventions  Electrical Stimulation;Aquatic Therapy;Ultrasound;Gait training;Functional mobility training;Therapeutic activities;Therapeutic exercise;Balance training;Neuromuscular re-education;Patient/family education;Manual techniques;Dry needling;Iontophoresis 4mg /ml Dexamethasone    PT Next Visit Plan  aquatherapy, LE strengthening, modalities PRN    Consulted and Agree with Plan of Care  Patient       Patient will  benefit from skilled therapeutic intervention in order to improve the following deficits and impairments:  Pain, Abnormal gait, Decreased balance, Decreased range of motion, Decreased strength, Difficulty walking  Visit Diagnosis: Muscle weakness (generalized) - Plan: PT plan of care cert/re-cert  Difficulty in walking, not elsewhere classified - Plan: PT plan of care cert/re-cert  Right knee pain, unspecified chronicity - Plan: PT plan of care cert/re-cert  Left knee pain, unspecified chronicity - Plan: PT plan of care  cert/re-cert  Pain in left leg - Plan: PT plan of care cert/re-cert  Pain in right leg - Plan: PT plan of care cert/re-cert     Problem List Patient Active Problem List   Diagnosis Date Noted  . Weakness 11/03/2012   Thank you for your referral.  [name] PT, DPT   07/15/2017, 5:31 PM  [place] [facility] [address] [place], [address] Phone: [phone]   Fax:  [phone]  Name: [name] MRN: [id number] Date of Birth: 1945-09-28

## 2017-07-15 NOTE — Patient Instructions (Addendum)
MedBridge Access Code: [email or web address]    Supine Straight Leg Raises 10x3 R LE Supine March 10x3 each LE

## 2017-07-20 ENCOUNTER — Ambulatory Visit: Payer: Medicare Other

## 2017-07-20 DIAGNOSIS — R262 Difficulty in walking, not elsewhere classified: Secondary | ICD-10-CM | POA: Diagnosis not present

## 2017-07-20 DIAGNOSIS — M25562 Pain in left knee: Secondary | ICD-10-CM | POA: Diagnosis not present

## 2017-07-20 DIAGNOSIS — M79605 Pain in left leg: Secondary | ICD-10-CM | POA: Diagnosis not present

## 2017-07-20 DIAGNOSIS — M79604 Pain in right leg: Secondary | ICD-10-CM

## 2017-07-20 DIAGNOSIS — M6281 Muscle weakness (generalized): Secondary | ICD-10-CM

## 2017-07-20 DIAGNOSIS — M25561 Pain in right knee: Secondary | ICD-10-CM | POA: Diagnosis not present

## 2017-07-20 NOTE — Therapy (Signed)
[place] [facility] [address], [place], [address] Phone: [phone]   Fax:  [phone]  Physical Therapy Treatment  Patient Details  Name: [name] MRN: [id number] Date of Birth: 1945/08/02 Referring Provider: Shary Decamp, [place]   Encounter Date: 07/20/2017  PT End of Session - 07/20/17 1309    Visit Number  50    Number of Visits  55    Date for PT Re-Evaluation  08/26/17    PT Start Time  1310 pt arrived late    PT Stop Time  1343    PT Time Calculation (min)  33 min    Activity Tolerance  Patient tolerated treatment well    Behavior During Therapy  [facility] for tasks assessed/performed       Past Medical History:  Diagnosis Date  . Arthritis   . Depression     Past Surgical History:  Procedure Laterality Date  . ABDOMINAL HYSTERECTOMY  1995  . back sugery    . BUNIONECTOMY  2013   rt foot  . COLONOSCOPY    . MUSCLE BIOPSY Left 10/03/2012   Procedure: LEFT QUADRICEP MUSCLE BIOPSY;  Surgeon: Odis Hollingshead, MD;  Location: [place];  Service: General;  Laterality: Left;  . NECK SURGERY  2010   cerv disc fused     There were no vitals filed for this visit.  Subjective Assessment - 07/20/17 1311    Subjective  Both legs are burning today L > R.     Pertinent History  LE weakness.  Symptoms occured suddenly, unknown method of injury prior to her first neck fusion surgery on January 2010. Had lower back surgery fusion in 2011.  The neck and back surgeries did not help. Pt states having increased urinary urgency and takes medication for for it. MD aware.  Denies saddle anesthesia.  Pt states that her doctor told her that PT is the only thing that is going to help her keep moving so she continues to participate in PT.  Last round of PT was last year which helped.  Currently has difficulty walking, performing chores (wash dishes, laundry), cooking. Better able to do her tasks a little  bit when she does therapy but gets harder when she stops.  Feels burning and stinging in both her knees, and bilateral anterior and lateral legs.  Pt states not having back or neck pain. Just a stiff neck.  Pt states she usually walks with a cane on her R side. No falls within the last 6 months.      Currently in Pain?  Yes    Pain Score  -- no pain level provided                               PT Education - 07/20/17 1326    Education provided  Yes    Education Details  ther-ex    [employer]) Educated  Patient    Methods  Explanation;Demonstration;Tactile cues;Verbal cues    Comprehension  Returned demonstration;Verbalized understanding          Objectives  MedBridge Access Code: [email or web address]  Pt wearing her AFO R foot.    Therapeutic exercise   Sitting with neutral thighs and legs (no leg ER). Decreased burning sensation   Then with hip adduction ball and glute max squeeze 10x5 seconds for 2 sets. Decreased burning sensation    Supine  marches 10x4 each LE with assist during first 3 sets  Supine SLR R hip flexion 10x4 with occasional assist  S/L R hip abduction 10x2  S/L R hip clamshells 10x2. PT assist at times due to weakness  Standing R LE leg press resisting blue band 10x3 with bilateral UE assist  Standing R hip flexion with bilateral UE assist 10x  Then 10x with PT assist   Improved exercise technique, movement at target joints, use of target muscles after min to mod verbal, visual, tactile cues.     Decreased bilateral knee and leg burning sensation with more neutral positioning of thighs and legs. Continued working on R hip flexor and glute med and max strengthening to promote ability to ambulate with better foot clearance secondary to weakness.            PT Long Term Goals - 07/15/17 1714      PT LONG TERM GOAL #1   Title  Pt will be independent with her HEP to promote LE strength in function.    Time  6    Period   Weeks    Status  On-going    Target Date  08/26/17      PT LONG TERM GOAL #2   Title  Patient will improve bilateral LE strength by at least 1/2 MMT grade to promote ability to ambulate and perform functional tasks.     Time  8    Period  Weeks    Status  Achieved      PT LONG TERM GOAL #3   Title  Patient will improve her LEFS score by at least 9 points as a demonstration of improved function.     Baseline  14/80 (07/29/2016); 18/80 (10/29/2016); 31/80 (12/09/2016); 18/80, pt however states increased bilateral knee pain recently (01/14/2017); 20/80 (04/12/17); 20/80 (05/20/2017); 26/80 (07/15/2017)    Time  8    Period  Weeks    Status  Achieved      PT LONG TERM GOAL #4   Title  Pt will report being able to walk/stand with SPC over 35 min to promote mobility.     Baseline  Pt states being able to walk for about 30 min (07/29/2016); about 1 hour per pt reports (09/07/2016)    Time  8    Period  Weeks    Status  Achieved      PT LONG TERM GOAL #5   Title  Patient will have a decrease in bilateral LE pain to 5/10 or less at worst to promote ability to perform functional tasks.     Baseline  8/10 at worst (07/29/2016); Minimal burning sensation bilateral knee and legs, no pain level number provided. Pt also states feeling pain in her knees during the weekend. No pain level provided (09/07/2016); 3/10 L leg burning sensation at most for the past 7 days (09/28/2016); bilateral knee burning sensation 8/10 at worst (10/29/2016); 7-8/10 bilateral knee pain, burning comes and goes (12/09/2016); 7-8/10 R knee, 5/10 L knee pain at most for the past 7 days (01/14/2017); 6-7/10 burning in L lower leg (04/12/17); 1-2/10 (05/18/2017); 5/10  L knee and leg burning at most , 0/10 R knee and leg burning at most for the past 7 days (07/15/2017)    Time  8    Period  Weeks    Status  Achieved      PT LONG TERM GOAL #6   Title  Pt will improve seated R hip flexion strength to at  least 3+/5 to promote ability to ambulate and  perform standing tasks.     Baseline  2+/5 seated R hip flexion (09/28/2016), (10/29/2016); (12/09/2016), (01/14/2017), (04/12/17); 2+/5 (05/20/2017), (07/15/2017)    Time  6    Period  Weeks    Status  On-going    Target Date  08/26/17      PT LONG TERM GOAL #7   Title  Pt will improve her LEFS score to 40/80 or more as a demonstration of improved function.     Baseline  31/80 (12/09/2016); 18/80, pt however states increased bilateral knee pain recently (01/14/2017); 20/80 (04/12/17), (05/20/2017); 26/80 (07/15/2016)    Time  6    Period  Weeks    Status  On-going    Target Date  08/26/17            Plan - 07/20/17 1308    Clinical Impression Statement  Decreased bilateral knee and leg burning sensation with more neutral positioning of thighs and legs. Continued working on R hip flexor and glute med and max strengthening to promote ability to ambulate with better foot clearance secondary to weakness.     Rehab Potential  Fair    Clinical Impairments Affecting Rehab Potential  Chronicity of condition    PT Frequency  2x / week    PT Duration  6 weeks    PT Treatment/Interventions  Electrical Stimulation;Aquatic Therapy;Ultrasound;Gait training;Functional mobility training;Therapeutic activities;Therapeutic exercise;Balance training;Neuromuscular re-education;Patient/family education;Manual techniques;Dry needling;Iontophoresis 4mg /ml Dexamethasone    PT Next Visit Plan  aquatherapy, LE strengthening, modalities PRN    Consulted and Agree with Plan of Care  Patient       Patient will benefit from skilled therapeutic intervention in order to improve the following deficits and impairments:  Pain, Abnormal gait, Decreased balance, Decreased range of motion, Decreased strength, Difficulty walking  Visit Diagnosis: Muscle weakness (generalized)  Difficulty in walking, not elsewhere classified  Right knee pain, unspecified chronicity  Left knee pain, unspecified chronicity  Pain in left  leg  Pain in right leg     Problem List Patient Active Problem List   Diagnosis Date Noted  . Weakness 11/03/2012   [name] PT, DPT   07/20/2017, 8:12 PM  [place] [facility] [address], [place], [address] Phone: [phone]   Fax:  [phone]  Name: [name] MRN: [id number] Date of Birth: [birth year]

## 2017-07-22 ENCOUNTER — Ambulatory Visit: Payer: Medicare Other | Attending: Physician Assistant

## 2017-07-22 ENCOUNTER — Other Ambulatory Visit: Payer: Self-pay

## 2017-07-22 ENCOUNTER — Ambulatory Visit: Payer: Medicare Other

## 2017-07-22 DIAGNOSIS — M79605 Pain in left leg: Secondary | ICD-10-CM

## 2017-07-22 DIAGNOSIS — M79604 Pain in right leg: Secondary | ICD-10-CM | POA: Diagnosis not present

## 2017-07-22 DIAGNOSIS — M545 Low back pain, unspecified: Secondary | ICD-10-CM

## 2017-07-22 DIAGNOSIS — R2689 Other abnormalities of gait and mobility: Secondary | ICD-10-CM | POA: Diagnosis not present

## 2017-07-22 DIAGNOSIS — M6281 Muscle weakness (generalized): Secondary | ICD-10-CM

## 2017-07-22 DIAGNOSIS — R2681 Unsteadiness on feet: Secondary | ICD-10-CM | POA: Diagnosis not present

## 2017-07-22 DIAGNOSIS — M2560 Stiffness of unspecified joint, not elsewhere classified: Secondary | ICD-10-CM | POA: Diagnosis not present

## 2017-07-22 DIAGNOSIS — M25561 Pain in right knee: Secondary | ICD-10-CM | POA: Diagnosis not present

## 2017-07-22 DIAGNOSIS — M5386 Other specified dorsopathies, lumbar region: Secondary | ICD-10-CM | POA: Diagnosis not present

## 2017-07-22 DIAGNOSIS — R269 Unspecified abnormalities of gait and mobility: Secondary | ICD-10-CM | POA: Diagnosis not present

## 2017-07-22 DIAGNOSIS — R262 Difficulty in walking, not elsewhere classified: Secondary | ICD-10-CM

## 2017-07-22 DIAGNOSIS — R29898 Other symptoms and signs involving the musculoskeletal system: Secondary | ICD-10-CM | POA: Diagnosis not present

## 2017-07-22 DIAGNOSIS — M25562 Pain in left knee: Secondary | ICD-10-CM

## 2017-07-22 NOTE — Therapy (Signed)
[place] MAIN [facility] SERVICES [address] [place], [place], [address] Phone: [phone]   Fax:  [phone]  Physical Therapy Treatment  Patient Details  Name: [name] MRN: [id number] Date of Birth: [birth year] Referring Provider: Shary Decamp, [place]   Encounter Date: 07/22/2017  PT End of Session - 07/22/17 1436    Visit Number  51    Number of Visits  93    Date for PT Re-Evaluation  08/26/17    PT Start Time  0815    PT Stop Time  0915    PT Time Calculation (min)  60 min    Activity Tolerance  Patient tolerated treatment well    Behavior During Therapy  [facility] for tasks assessed/performed       Past Medical History:  Diagnosis Date  . Arthritis   . Depression     Past Surgical History:  Procedure Laterality Date  . ABDOMINAL HYSTERECTOMY  1995  . back sugery    . BUNIONECTOMY  2013   rt foot  . COLONOSCOPY    . MUSCLE BIOPSY Left 10/03/2012   Procedure: LEFT QUADRICEP MUSCLE BIOPSY;  Surgeon: Odis Hollingshead, MD;  Location: [place];  Service: General;  Laterality: Left;  . NECK SURGERY  2010   cerv disc fused     There were no vitals filed for this visit.  Subjective Assessment - 07/22/17 1433    Subjective  continues with weak RLE, but decreased swelling throughout B knees. No significant pain complaints today    Pertinent History  LE weakness.  Symptoms occured suddenly, unknown method of injury prior to her first neck fusion surgery on January 2010. Had lower back surgery fusion in 2011.  The neck and back surgeries did not help. Pt states having increased urinary urgency and takes medication for for it. MD aware.  Denies saddle anesthesia.  Pt states that her doctor told her that PT is the only thing that is going to help her keep moving so she continues to participate in PT.  Last round of PT was last year which helped.  Currently has difficulty walking, performing chores (wash dishes, laundry),  cooking. Better able to do her tasks a little bit when she does therapy but gets harder when she stops.  Feels burning and stinging in both her knees, and bilateral anterior and lateral legs.  Pt states not having back or neck pain. Just a stiff neck.  Pt states she usually walks with a cane on her R side. No falls within the last 6 months.        Enters/exits via ramp Participates in the following  Ambulation, blue dumbbells  4 L fwd  4 L side  Bench LE/core, 1# ankle wts  Bike, 3 min  Scissor, 3 min  Flutter, 3 min  Core with partial SKTC tuck, 3 x 10 B  Core with hip outward circles in ER, 3 x 10 B  Step in 4 ft water depth  Step ups, B 30x ea slow eccentric   Core with LE strengthening, 25x ea  hip abd/add  hip flex/ext  Side lunges, 20x ea   Stretching, standing at step, 3 x 15 sec ea  hamstrings/gastroc  hip flexors/quad                           PT Education - 07/22/17 1434    Education provided  Yes  Education Details  step ups and side lunges    Person(s) Educated  Patient    Methods  Explanation;Demonstration;Verbal cues    Comprehension  Verbalized understanding;Returned demonstration;Verbal cues required;Need further instruction          PT Long Term Goals - 07/15/17 1714      PT LONG TERM GOAL #1   Title  Pt will be independent with her HEP to promote LE strength in function.    Time  6    Period  Weeks    Status  On-going    Target Date  08/26/17      PT LONG TERM GOAL #2   Title  Patient will improve bilateral LE strength by at least 1/2 MMT grade to promote ability to ambulate and perform functional tasks.     Time  8    Period  Weeks    Status  Achieved      PT LONG TERM GOAL #3   Title  Patient will improve her LEFS score by at least 9 points as a demonstration of improved function.     Baseline  14/80 (07/29/2016); 18/80 (10/29/2016); 31/80 (12/09/2016); 18/80, pt however states increased bilateral knee pain recently  (01/14/2017); 20/80 (04/12/17); 20/80 (05/20/2017); 26/80 (07/15/2017)    Time  8    Period  Weeks    Status  Achieved      PT LONG TERM GOAL #4   Title  Pt will report being able to walk/stand with SPC over 35 min to promote mobility.     Baseline  Pt states being able to walk for about 30 min (07/29/2016); about 1 hour per pt reports (09/07/2016)    Time  8    Period  Weeks    Status  Achieved      PT LONG TERM GOAL #5   Title  Patient will have a decrease in bilateral LE pain to 5/10 or less at worst to promote ability to perform functional tasks.     Baseline  8/10 at worst (07/29/2016); Minimal burning sensation bilateral knee and legs, no pain level number provided. Pt also states feeling pain in her knees during the weekend. No pain level provided (09/07/2016); 3/10 L leg burning sensation at most for the past 7 days (09/28/2016); bilateral knee burning sensation 8/10 at worst (10/29/2016); 7-8/10 bilateral knee pain, burning comes and goes (12/09/2016); 7-8/10 R knee, 5/10 L knee pain at most for the past 7 days (01/14/2017); 6-7/10 burning in L lower leg (04/12/17); 1-2/10 (05/18/2017); 5/10  L knee and leg burning at most , 0/10 R knee and leg burning at most for the past 7 days (07/15/2017)    Time  8    Period  Weeks    Status  Achieved      PT LONG TERM GOAL #6   Title  Pt will improve seated R hip flexion strength to at least 3+/5 to promote ability to ambulate and perform standing tasks.     Baseline  2+/5 seated R hip flexion (09/28/2016), (10/29/2016); (12/09/2016), (01/14/2017), (04/12/17); 2+/5 (05/20/2017), (07/15/2017)    Time  6    Period  Weeks    Status  On-going    Target Date  08/26/17      PT LONG TERM GOAL #7   Title  Pt will improve her LEFS score to 40/80 or more as a demonstration of improved function.     Baseline  31/80 (12/09/2016); 18/80, pt however states increased bilateral knee pain  recently (01/14/2017); 20/80 (04/12/17), (05/20/2017); 26/80 (07/15/2016)    Time  6    Period   Weeks    Status  On-going    Target Date  08/26/17            Plan - 07/22/17 1437    Clinical Impression Statement  Pt tolerating session well; progressed LE strengthening exercises and duration of tolerance today. Continue to require VCs for improved gait technique with warm up walking using water to increase R foot cleaarnce through increased hip/knee flexion and DF.     Rehab Potential  Fair    Clinical Impairments Affecting Rehab Potential  Chronicity of condition    PT Frequency  2x / week    PT Duration  6 weeks    PT Treatment/Interventions  Electrical Stimulation;Aquatic Therapy;Ultrasound;Gait training;Functional mobility training;Therapeutic activities;Therapeutic exercise;Balance training;Neuromuscular re-education;Patient/family education;Manual techniques;Dry needling;Iontophoresis 4mg /ml Dexamethasone    PT Next Visit Plan  aquatherapy, LE strengthening, modalities PRN    Consulted and Agree with Plan of Care  Patient       Patient will benefit from skilled therapeutic intervention in order to improve the following deficits and impairments:  Pain, Abnormal gait, Decreased balance, Decreased range of motion, Decreased strength, Difficulty walking  Visit Diagnosis: Muscle weakness (generalized)  Difficulty in walking, not elsewhere classified  Right knee pain, unspecified chronicity  Left knee pain, unspecified chronicity  Pain in left leg  Pain in right leg  Unsteadiness on feet  Other abnormalities of gait and mobility  Bilateral low back pain without sciatica, unspecified chronicity  Joint stiffness of spine  Unsteadiness  Decreased ROM of lumbar spine  Abnormality of gait  Weakness of both legs     Problem List Patient Active Problem List   Diagnosis Date Noted  . Weakness 11/03/2012    [name] 07/22/2017, 2:40 PM  [place] MAIN [facility] SERVICES [address] [place], [place],  [address] Phone: [phone]   Fax:  [phone]  Name: [name] MRN: [id number] Date of Birth: 10-Jan-1946

## 2017-07-27 ENCOUNTER — Ambulatory Visit: Payer: Medicare Other

## 2017-07-27 DIAGNOSIS — M6281 Muscle weakness (generalized): Secondary | ICD-10-CM | POA: Diagnosis not present

## 2017-07-27 DIAGNOSIS — M79604 Pain in right leg: Secondary | ICD-10-CM | POA: Diagnosis not present

## 2017-07-27 DIAGNOSIS — M79605 Pain in left leg: Secondary | ICD-10-CM

## 2017-07-27 DIAGNOSIS — M25561 Pain in right knee: Secondary | ICD-10-CM | POA: Diagnosis not present

## 2017-07-27 DIAGNOSIS — M25562 Pain in left knee: Secondary | ICD-10-CM | POA: Diagnosis not present

## 2017-07-27 DIAGNOSIS — R262 Difficulty in walking, not elsewhere classified: Secondary | ICD-10-CM | POA: Diagnosis not present

## 2017-07-27 NOTE — Therapy (Signed)
[place] [facility] [address], [place], [address] Phone: [phone]   Fax:  [phone]  Physical Therapy Treatment  Patient Details  Name: [name] MRN: [id number] Date of Birth: February 09, 1946 Referring Provider: Shary Decamp, [place]   Encounter Date: 07/27/2017  PT End of Session - 07/27/17 1311    Visit Number  52    Number of Visits  93    Date for PT Re-Evaluation  08/26/17    PT Start Time  1311 pt arrived late    PT Stop Time  1342    PT Time Calculation (min)  31 min    Activity Tolerance  Patient tolerated treatment well    Behavior During Therapy  [facility] for tasks assessed/performed       Past Medical History:  Diagnosis Date  . Arthritis   . Depression     Past Surgical History:  Procedure Laterality Date  . ABDOMINAL HYSTERECTOMY  1995  . back sugery    . BUNIONECTOMY  2013   rt foot  . COLONOSCOPY    . MUSCLE BIOPSY Left 10/03/2012   Procedure: LEFT QUADRICEP MUSCLE BIOPSY;  Surgeon: Odis Hollingshead, MD;  Location: [place];  Service: General;  Laterality: Left;  . NECK SURGERY  2010   cerv disc fused     There were no vitals filed for this visit.  Subjective Assessment - 07/27/17 1313    Subjective  Went to [facility] yesterday and did a lot of walking. A little tired today. Usually takes her 2 days to recover. A little burning L (lateral) leg.     Pertinent History  LE weakness.  Symptoms occured suddenly, unknown method of injury prior to her first neck fusion surgery on January 2010. Had lower back surgery fusion in 2011.  The neck and back surgeries did not help. Pt states having increased urinary urgency and takes medication for for it. MD aware.  Denies saddle anesthesia.  Pt states that her doctor told her that PT is the only thing that is going to help her keep moving so she continues to participate in PT.  Last round of PT was last year which helped.  Currently  has difficulty walking, performing chores (wash dishes, laundry), cooking. Better able to do her tasks a little bit when she does therapy but gets harder when she stops.  Feels burning and stinging in both her knees, and bilateral anterior and lateral legs.  Pt states not having back or neck pain. Just a stiff neck.  Pt states she usually walks with a cane on her R side. No falls within the last 6 months.      Currently in Pain?  Yes    Pain Score  6  L lateral leg burning    Pain Orientation  Left    Pain Descriptors / Indicators  Burning                               PT Education - 07/27/17 1316    Education provided  Yes    Education Details  ther-ex    Person(s) Educated  Patient    Methods  Explanation;Demonstration;Tactile cues;Verbal cues    Comprehension  Returned demonstration;Verbalized understanding         Objectives  MedBridge Access Code: [email or web address]     Therapeutic exercise    Supine marches 10x4 each LE  with assist  Supine SLR R hip flexion 10x with PT assist    Standing R LE leg press resisting blue band 10x2 with bilateral UE assist  Standing R hip abduction with bilateral UE assist 10x3  Standing R hip extension with bilateral UE assist 10x  More rest breaks today secondary to pt reports of fatigue from yesterday' trip to [place] to see her grandchildren   Improved exercise technique, movement at target joints, use of target muscles after min to mod verbal, visual, tactile cues.    Pt demonstrates increased fatigue today compared to previous sessions in which increased activity yesterday might play a factor. Continued working on R hip flexor strengthening to promote foot clearance during gait as well as hip strengthening to promote ability to ambulate. Light session today secondary to pt feeling tired. Decreased L leg burning after session.               PT Long Term Goals - 07/15/17 1714      PT LONG  TERM GOAL #1   Title  Pt will be independent with her HEP to promote LE strength in function.    Time  6    Period  Weeks    Status  On-going    Target Date  08/26/17      PT LONG TERM GOAL #2   Title  Patient will improve bilateral LE strength by at least 1/2 MMT grade to promote ability to ambulate and perform functional tasks.     Time  8    Period  Weeks    Status  Achieved      PT LONG TERM GOAL #3   Title  Patient will improve her LEFS score by at least 9 points as a demonstration of improved function.     Baseline  14/80 (07/29/2016); 18/80 (10/29/2016); 31/80 (12/09/2016); 18/80, pt however states increased bilateral knee pain recently (01/14/2017); 20/80 (04/12/17); 20/80 (05/20/2017); 26/80 (07/15/2017)    Time  8    Period  Weeks    Status  Achieved      PT LONG TERM GOAL #4   Title  Pt will report being able to walk/stand with SPC over 35 min to promote mobility.     Baseline  Pt states being able to walk for about 30 min (07/29/2016); about 1 hour per pt reports (09/07/2016)    Time  8    Period  Weeks    Status  Achieved      PT LONG TERM GOAL #5   Title  Patient will have a decrease in bilateral LE pain to 5/10 or less at worst to promote ability to perform functional tasks.     Baseline  8/10 at worst (07/29/2016); Minimal burning sensation bilateral knee and legs, no pain level number provided. Pt also states feeling pain in her knees during the weekend. No pain level provided (09/07/2016); 3/10 L leg burning sensation at most for the past 7 days (09/28/2016); bilateral knee burning sensation 8/10 at worst (10/29/2016); 7-8/10 bilateral knee pain, burning comes and goes (12/09/2016); 7-8/10 R knee, 5/10 L knee pain at most for the past 7 days (01/14/2017); 6-7/10 burning in L lower leg (04/12/17); 1-2/10 (05/18/2017); 5/10  L knee and leg burning at most , 0/10 R knee and leg burning at most for the past 7 days (07/15/2017)    Time  8    Period  Weeks    Status  Achieved      PT LONG  TERM GOAL #6   Title  Pt will improve seated R hip flexion strength to at least 3+/5 to promote ability to ambulate and perform standing tasks.     Baseline  2+/5 seated R hip flexion (09/28/2016), (10/29/2016); (12/09/2016), (01/14/2017), (04/12/17); 2+/5 (05/20/2017), (07/15/2017)    Time  6    Period  Weeks    Status  On-going    Target Date  08/26/17      PT LONG TERM GOAL #7   Title  Pt will improve her LEFS score to 40/80 or more as a demonstration of improved function.     Baseline  31/80 (12/09/2016); 18/80, pt however states increased bilateral knee pain recently (01/14/2017); 20/80 (04/12/17), (05/20/2017); 26/80 (07/15/2016)    Time  6    Period  Weeks    Status  On-going    Target Date  08/26/17            Plan - 07/27/17 1310    Clinical Impression Statement  Pt demonstrates increased fatigue today compared to previous sessions in which increased activity yesterday might play a factor. Continued working on R hip flexor strengthening to promote foot clearance during gait as well as hip strengthening to promote ability to ambulate. Light session today secondary to pt feeling tired. Decreased L leg burning after session.     Rehab Potential  Fair    Clinical Impairments Affecting Rehab Potential  Chronicity of condition    PT Frequency  2x / week    PT Duration  6 weeks    PT Treatment/Interventions  Electrical Stimulation;Aquatic Therapy;Ultrasound;Gait training;Functional mobility training;Therapeutic activities;Therapeutic exercise;Balance training;Neuromuscular re-education;Patient/family education;Manual techniques;Dry needling;Iontophoresis 4mg /ml Dexamethasone    PT Next Visit Plan  aquatherapy, LE strengthening, modalities PRN    Consulted and Agree with Plan of Care  Patient       Patient will benefit from skilled therapeutic intervention in order to improve the following deficits and impairments:  Pain, Abnormal gait, Decreased balance, Decreased range of motion, Decreased  strength, Difficulty walking  Visit Diagnosis: Muscle weakness (generalized)  Difficulty in walking, not elsewhere classified  Pain in left leg     Problem List Patient Active Problem List   Diagnosis Date Noted  . Weakness 11/03/2012    [name] PT, DPT   07/27/2017, 6:50 PM  [place] PH[facility] [address], [place], [address] Phone: [phone]   Fax:  [phone]  Name: [name] MRN: [id number] Date of Birth: January 11, 1946

## 2017-07-29 ENCOUNTER — Encounter

## 2017-07-29 ENCOUNTER — Other Ambulatory Visit: Payer: Self-pay

## 2017-07-29 ENCOUNTER — Ambulatory Visit: Payer: Medicare Other

## 2017-07-29 DIAGNOSIS — M79605 Pain in left leg: Secondary | ICD-10-CM | POA: Diagnosis not present

## 2017-07-29 DIAGNOSIS — R29898 Other symptoms and signs involving the musculoskeletal system: Secondary | ICD-10-CM

## 2017-07-29 DIAGNOSIS — M25562 Pain in left knee: Secondary | ICD-10-CM

## 2017-07-29 DIAGNOSIS — M6281 Muscle weakness (generalized): Secondary | ICD-10-CM | POA: Diagnosis not present

## 2017-07-29 DIAGNOSIS — M79604 Pain in right leg: Secondary | ICD-10-CM

## 2017-07-29 DIAGNOSIS — R262 Difficulty in walking, not elsewhere classified: Secondary | ICD-10-CM | POA: Diagnosis not present

## 2017-07-29 DIAGNOSIS — M25561 Pain in right knee: Secondary | ICD-10-CM

## 2017-07-29 DIAGNOSIS — M2560 Stiffness of unspecified joint, not elsewhere classified: Secondary | ICD-10-CM

## 2017-07-29 DIAGNOSIS — R269 Unspecified abnormalities of gait and mobility: Secondary | ICD-10-CM

## 2017-07-29 DIAGNOSIS — M545 Low back pain, unspecified: Secondary | ICD-10-CM

## 2017-07-29 DIAGNOSIS — R2689 Other abnormalities of gait and mobility: Secondary | ICD-10-CM

## 2017-07-29 DIAGNOSIS — M5386 Other specified dorsopathies, lumbar region: Secondary | ICD-10-CM

## 2017-07-29 DIAGNOSIS — R2681 Unsteadiness on feet: Secondary | ICD-10-CM

## 2017-07-29 NOTE — Therapy (Signed)
[place] MAIN [facility] SERVICES [address] [place], [place], [address] Phone: [phone]   Fax:  [phone]  Physical Therapy Treatment  Patient Details  Name: [name] MRN: [id number] Date of Birth: 1945-04-01 Referring Provider: Shary Decamp, [place]   Encounter Date: 07/29/2017  PT End of Session - 07/29/17 1613    Visit Number  76    Number of Visits  93    Date for PT Re-Evaluation  08/26/17    PT Start Time  1210    PT Stop Time  1310    PT Time Calculation (min)  60 min    Activity Tolerance  Patient tolerated treatment well    Behavior During Therapy  [facility] for tasks assessed/performed       Past Medical History:  Diagnosis Date  . Arthritis   . Depression     Past Surgical History:  Procedure Laterality Date  . ABDOMINAL HYSTERECTOMY  1995  . back sugery    . BUNIONECTOMY  2013   rt foot  . COLONOSCOPY    . MUSCLE BIOPSY Left 10/03/2012   Procedure: LEFT QUADRICEP MUSCLE BIOPSY;  Surgeon: Odis Hollingshead, MD;  Location: [place];  Service: General;  Laterality: Left;  . NECK SURGERY  2010   cerv disc fused     There were no vitals filed for this visit.  Subjective Assessment - 07/29/17 1611    Subjective  Pt notes "usual" aches in LE, but overall B knee pain less. No complaints of swelling in knees today. RLE continues weak    Pertinent History  LE weakness.  Symptoms occured suddenly, unknown method of injury prior to her first neck fusion surgery on January 2010. Had lower back surgery fusion in 2011.  The neck and back surgeries did not help. Pt states having increased urinary urgency and takes medication for for it. MD aware.  Denies saddle anesthesia.  Pt states that her doctor told her that PT is the only thing that is going to help her keep moving so she continues to participate in PT.  Last round of PT was last year which helped.  Currently has difficulty walking, performing chores (wash  dishes, laundry), cooking. Better able to do her tasks a little bit when she does therapy but gets harder when she stops.  Feels burning and stinging in both her knees, and bilateral anterior and lateral legs.  Pt states not having back or neck pain. Just a stiff neck.  Pt states she usually walks with a cane on her R side. No falls within the last 6 months.        Enters/exits via ramp   Ambulation, blue dumbbells  Fwd 6 L  Side 6 L  Modified planks, 3 x 30 sec ea  Fwd   Side, B  Back stretch, center/R/L, 3 x 10 sec each  Core with UE strengthening  Red dumbbells, 2 x 10 ea   Triceps press down   Sh abd/add   Sh flex/ext   Sh horiz abd/add  Bench, core, B 2 x 10 ea  Small SKTC tuck   Straight leg, hip ER, small outward circle  Balance/strength  Single leg stance with toe touch opposite, B, 2 min each    with ball toss  Side step with ball toss, 4 L  Stand (ball overhead in hands), ball to small knee tuck, B 15x ea  PT Education - 07/29/17 1613    Education provided  Yes    Education Details  Balance exercise in combination with core/LE strength    Person(s) Educated  Patient    Methods  Explanation;Demonstration;Verbal cues    Comprehension  Verbalized understanding;Returned demonstration;Verbal cues required          PT Long Term Goals - 07/15/17 1714      PT LONG TERM GOAL #1   Title  Pt will be independent with her HEP to promote LE strength in function.    Time  6    Period  Weeks    Status  On-going    Target Date  08/26/17      PT LONG TERM GOAL #2   Title  Patient will improve bilateral LE strength by at least 1/2 MMT grade to promote ability to ambulate and perform functional tasks.     Time  8    Period  Weeks    Status  Achieved      PT LONG TERM GOAL #3   Title  Patient will improve her LEFS score by at least 9 points as a demonstration of improved function.     Baseline  14/80 (07/29/2016); 18/80  (10/29/2016); 31/80 (12/09/2016); 18/80, pt however states increased bilateral knee pain recently (01/14/2017); 20/80 (04/12/17); 20/80 (05/20/2017); 26/80 (07/15/2017)    Time  8    Period  Weeks    Status  Achieved      PT LONG TERM GOAL #4   Title  Pt will report being able to walk/stand with SPC over 35 min to promote mobility.     Baseline  Pt states being able to walk for about 30 min (07/29/2016); about 1 hour per pt reports (09/07/2016)    Time  8    Period  Weeks    Status  Achieved      PT LONG TERM GOAL #5   Title  Patient will have a decrease in bilateral LE pain to 5/10 or less at worst to promote ability to perform functional tasks.     Baseline  8/10 at worst (07/29/2016); Minimal burning sensation bilateral knee and legs, no pain level number provided. Pt also states feeling pain in her knees during the weekend. No pain level provided (09/07/2016); 3/10 L leg burning sensation at most for the past 7 days (09/28/2016); bilateral knee burning sensation 8/10 at worst (10/29/2016); 7-8/10 bilateral knee pain, burning comes and goes (12/09/2016); 7-8/10 R knee, 5/10 L knee pain at most for the past 7 days (01/14/2017); 6-7/10 burning in L lower leg (04/12/17); 1-2/10 (05/18/2017); 5/10  L knee and leg burning at most , 0/10 R knee and leg burning at most for the past 7 days (07/15/2017)    Time  8    Period  Weeks    Status  Achieved      PT LONG TERM GOAL #6   Title  Pt will improve seated R hip flexion strength to at least 3+/5 to promote ability to ambulate and perform standing tasks.     Baseline  2+/5 seated R hip flexion (09/28/2016), (10/29/2016); (12/09/2016), (01/14/2017), (04/12/17); 2+/5 (05/20/2017), (07/15/2017)    Time  6    Period  Weeks    Status  On-going    Target Date  08/26/17      PT LONG TERM GOAL #7   Title  Pt will improve her LEFS score to 40/80 or more as a demonstration of improved function.  Baseline  31/80 (12/09/2016); 18/80, pt however states increased bilateral knee pain  recently (01/14/2017); 20/80 (04/12/17), (05/20/2017); 26/80 (07/15/2016)    Time  6    Period  Weeks    Status  On-going    Target Date  08/26/17            Plan - 07/29/17 1614    Clinical Impression Statement  Pt demonstrated progression of core/LE work today with addition of incorporating balance. Pt felt "good level of work/challenge". Denies too fatigue post session, but advised to keep a good mental note of symptoms later today/tomorrow for appropriate level of work/progression.     Rehab Potential  Fair    Clinical Impairments Affecting Rehab Potential  Chronicity of condition    PT Frequency  2x / week    PT Duration  6 weeks    PT Treatment/Interventions  Electrical Stimulation;Aquatic Therapy;Ultrasound;Gait training;Functional mobility training;Therapeutic activities;Therapeutic exercise;Balance training;Neuromuscular re-education;Patient/family education;Manual techniques;Dry needling;Iontophoresis 4mg /ml Dexamethasone    PT Next Visit Plan  aquatherapy, LE strengthening, modalities PRN    Consulted and Agree with Plan of Care  Patient       Patient will benefit from skilled therapeutic intervention in order to improve the following deficits and impairments:  Pain, Abnormal gait, Decreased balance, Decreased range of motion, Decreased strength, Difficulty walking  Visit Diagnosis: Muscle weakness (generalized)  Difficulty in walking, not elsewhere classified  Pain in left leg  Right knee pain, unspecified chronicity  Left knee pain, unspecified chronicity  Pain in right leg  Unsteadiness on feet  Weakness of both legs  Abnormality of gait  Decreased ROM of lumbar spine  Unsteadiness  Joint stiffness of spine  Bilateral low back pain without sciatica, unspecified chronicity  Other abnormalities of gait and mobility     Problem List Patient Active Problem List   Diagnosis Date Noted  . Weakness 11/03/2012    Larae Grooms 07/29/2017, 4:17  PM  [place] MAIN [facility] SERVICES [address] [place], [place], [address] Phone: [phone]   Fax:  [phone]  Name: AMARIANNA ABPLANALP MRN: [id number] Date of Birth: 09-24-1945

## 2017-08-03 ENCOUNTER — Ambulatory Visit: Payer: Medicare Other

## 2017-08-03 ENCOUNTER — Other Ambulatory Visit: Payer: Self-pay

## 2017-08-03 DIAGNOSIS — M545 Low back pain, unspecified: Secondary | ICD-10-CM

## 2017-08-03 DIAGNOSIS — M79604 Pain in right leg: Secondary | ICD-10-CM

## 2017-08-03 DIAGNOSIS — M79605 Pain in left leg: Secondary | ICD-10-CM

## 2017-08-03 DIAGNOSIS — R269 Unspecified abnormalities of gait and mobility: Secondary | ICD-10-CM

## 2017-08-03 DIAGNOSIS — R2681 Unsteadiness on feet: Secondary | ICD-10-CM

## 2017-08-03 DIAGNOSIS — M25562 Pain in left knee: Secondary | ICD-10-CM

## 2017-08-03 DIAGNOSIS — M25561 Pain in right knee: Secondary | ICD-10-CM

## 2017-08-03 DIAGNOSIS — M6281 Muscle weakness (generalized): Secondary | ICD-10-CM

## 2017-08-03 DIAGNOSIS — M2560 Stiffness of unspecified joint, not elsewhere classified: Secondary | ICD-10-CM

## 2017-08-03 DIAGNOSIS — M5386 Other specified dorsopathies, lumbar region: Secondary | ICD-10-CM

## 2017-08-03 DIAGNOSIS — R29898 Other symptoms and signs involving the musculoskeletal system: Secondary | ICD-10-CM

## 2017-08-03 DIAGNOSIS — R262 Difficulty in walking, not elsewhere classified: Secondary | ICD-10-CM

## 2017-08-03 DIAGNOSIS — R2689 Other abnormalities of gait and mobility: Secondary | ICD-10-CM

## 2017-08-03 NOTE — Therapy (Signed)
[place] MAIN [facility] SERVICES [address] [place], [place], [address] Phone: [phone]   Fax:  [phone]  Physical Therapy Treatment  Patient Details  Name: [name] MRN: [id number] Date of Birth: 07/26/1945 No data recorded  Encounter Date: 08/03/2017  PT End of Session - 08/03/17 1432    Visit Number  47    Number of Visits  17    Date for PT Re-Evaluation  08/26/17    PT Start Time  1130    PT Stop Time  1235    PT Time Calculation (min)  65 min    Activity Tolerance  Patient tolerated treatment well    Behavior During Therapy  [facility] for tasks assessed/performed       Past Medical History:  Diagnosis Date  . Arthritis   . Depression     Past Surgical History:  Procedure Laterality Date  . ABDOMINAL HYSTERECTOMY  1995  . back sugery    . BUNIONECTOMY  2013   rt foot  . COLONOSCOPY    . MUSCLE BIOPSY Left 10/03/2012   Procedure: LEFT QUADRICEP MUSCLE BIOPSY;  Surgeon: Odis Hollingshead, MD;  Location: [place];  Service: General;  Laterality: Left;  . NECK SURGERY  2010   cerv disc fused     There were no vitals filed for this visit.  Subjective Assessment - 08/03/17 1429    Subjective  Pt reports feeling a little better overall with a little improved strength, as pt can tolerate standing longer and a little improved balance.     Pertinent History  LE weakness.  Symptoms occured suddenly, unknown method of injury prior to her first neck fusion surgery on January 2010. Had lower back surgery fusion in 2011.  The neck and back surgeries did not help. Pt states having increased urinary urgency and takes medication for for it. MD aware.  Denies saddle anesthesia.  Pt states that her doctor told her that PT is the only thing that is going to help her keep moving so she continues to participate in PT.  Last round of PT was last year which helped.  Currently has difficulty walking, performing chores (wash dishes,  laundry), cooking. Better able to do her tasks a little bit when she does therapy but gets harder when she stops.  Feels burning and stinging in both her knees, and bilateral anterior and lateral legs.  Pt states not having back or neck pain. Just a stiff neck.  Pt states she usually walks with a cane on her R side. No falls within the last 6 months.        Ambulation  6 L fwd  4 L side   Core with LE work, 30x ea  hip abd/add  hip flex/ext 20x each  Squats  sumo squats  Core with UE work, 3 x 10 ea  Green dumbbells   Triceps ext   Sh abd/add   Sh flex/ext   Sh horiz abd/add  Bench, core, 1# ankle wts, 2 x 10 ea, B  SKTC partial tucks  straight leg up and outs 4 min ea  Bike  Scissor  Flutter  Stretch, B, 3 x 15 sec each  Stand at step   Hamstrings/gastroc   Hip flexor/quad (able to feel more on R today)                           PT Education -  08/03/17 1430    Education provided  Yes    Education Details  on continuation independently with water/land exercises. Emphasizing water walking as most important water exercise for pt that cannot be adapted well out of water.           PT Long Term Goals - 07/15/17 1714      PT LONG TERM GOAL #1   Title  Pt will be independent with her HEP to promote LE strength in function.    Time  6    Period  Weeks    Status  On-going    Target Date  08/26/17      PT LONG TERM GOAL #2   Title  Patient will improve bilateral LE strength by at least 1/2 MMT grade to promote ability to ambulate and perform functional tasks.     Time  8    Period  Weeks    Status  Achieved      PT LONG TERM GOAL #3   Title  Patient will improve her LEFS score by at least 9 points as a demonstration of improved function.     Baseline  14/80 (07/29/2016); 18/80 (10/29/2016); 31/80 (12/09/2016); 18/80, pt however states increased bilateral knee pain recently (01/14/2017); 20/80 (04/12/17); 20/80 (05/20/2017); 26/80 (07/15/2017)     Time  8    Period  Weeks    Status  Achieved      PT LONG TERM GOAL #4   Title  Pt will report being able to walk/stand with SPC over 35 min to promote mobility.     Baseline  Pt states being able to walk for about 30 min (07/29/2016); about 1 hour per pt reports (09/07/2016)    Time  8    Period  Weeks    Status  Achieved      PT LONG TERM GOAL #5   Title  Patient will have a decrease in bilateral LE pain to 5/10 or less at worst to promote ability to perform functional tasks.     Baseline  8/10 at worst (07/29/2016); Minimal burning sensation bilateral knee and legs, no pain level number provided. Pt also states feeling pain in her knees during the weekend. No pain level provided (09/07/2016); 3/10 L leg burning sensation at most for the past 7 days (09/28/2016); bilateral knee burning sensation 8/10 at worst (10/29/2016); 7-8/10 bilateral knee pain, burning comes and goes (12/09/2016); 7-8/10 R knee, 5/10 L knee pain at most for the past 7 days (01/14/2017); 6-7/10 burning in L lower leg (04/12/17); 1-2/10 (05/18/2017); 5/10  L knee and leg burning at most , 0/10 R knee and leg burning at most for the past 7 days (07/15/2017)    Time  8    Period  Weeks    Status  Achieved      PT LONG TERM GOAL #6   Title  Pt will improve seated R hip flexion strength to at least 3+/5 to promote ability to ambulate and perform standing tasks.     Baseline  2+/5 seated R hip flexion (09/28/2016), (10/29/2016); (12/09/2016), (01/14/2017), (04/12/17); 2+/5 (05/20/2017), (07/15/2017)    Time  6    Period  Weeks    Status  On-going    Target Date  08/26/17      PT LONG TERM GOAL #7   Title  Pt will improve her LEFS score to 40/80 or more as a demonstration of improved function.     Baseline  31/80 (12/09/2016); 18/80, pt  however states increased bilateral knee pain recently (01/14/2017); 20/80 (04/12/17), (05/20/2017); 26/80 (07/15/2016)    Time  6    Period  Weeks    Status  On-going    Target Date  08/26/17             Plan - 08/03/17 1433    Clinical Impression Statement  Pt continues to tolerate well. Discussion regarding best program/exercises for pt in and out of water, as pt notes she can continue with water exercises during summer months, but would prefer not to in colder months. Discussed most core exercises being able to be adapted to a land variation (some difference due to decreased LE strength/range out of water). Water walking; however, is of great benefits to pt and is not truly adaptable to land; pt understands. Will begin to create aquatic program for pt to use independently.     Rehab Potential  Fair    Clinical Impairments Affecting Rehab Potential  Chronicity of condition    PT Frequency  2x / week    PT Duration  6 weeks    PT Treatment/Interventions  Electrical Stimulation;Aquatic Therapy;Ultrasound;Gait training;Functional mobility training;Therapeutic activities;Therapeutic exercise;Balance training;Neuromuscular re-education;Patient/family education;Manual techniques;Dry needling;Iontophoresis 4mg /ml Dexamethasone    PT Next Visit Plan  aquatherapy, LE strengthening, modalities PRN    Consulted and Agree with Plan of Care  Patient       Patient will benefit from skilled therapeutic intervention in order to improve the following deficits and impairments:  Pain, Abnormal gait, Decreased balance, Decreased range of motion, Decreased strength, Difficulty walking  Visit Diagnosis: Muscle weakness (generalized)  Difficulty in walking, not elsewhere classified  Pain in left leg  Right knee pain, unspecified chronicity  Left knee pain, unspecified chronicity  Pain in right leg  Unsteadiness on feet  Other abnormalities of gait and mobility  Bilateral low back pain without sciatica, unspecified chronicity  Joint stiffness of spine  Unsteadiness  Decreased ROM of lumbar spine  Abnormality of gait  Weakness of both legs     Problem List Patient Active  Problem List   Diagnosis Date Noted  . Weakness 11/03/2012    Larae Grooms 08/03/2017, 2:38 PM  [place] MAIN [facility] SERVICES [address] [place], [place], [address] Phone: [phone]   Fax:  [phone]  Name: [name] MRN: [id number] Date of Birth: 13-Dec-1945

## 2017-08-05 ENCOUNTER — Ambulatory Visit: Payer: Medicare Other

## 2017-08-10 ENCOUNTER — Ambulatory Visit: Payer: Medicare Other

## 2017-08-10 ENCOUNTER — Other Ambulatory Visit: Payer: Self-pay

## 2017-08-10 DIAGNOSIS — R269 Unspecified abnormalities of gait and mobility: Secondary | ICD-10-CM

## 2017-08-10 DIAGNOSIS — R2689 Other abnormalities of gait and mobility: Secondary | ICD-10-CM

## 2017-08-10 DIAGNOSIS — R29898 Other symptoms and signs involving the musculoskeletal system: Secondary | ICD-10-CM

## 2017-08-10 DIAGNOSIS — M79604 Pain in right leg: Secondary | ICD-10-CM

## 2017-08-10 DIAGNOSIS — M6281 Muscle weakness (generalized): Secondary | ICD-10-CM | POA: Diagnosis not present

## 2017-08-10 DIAGNOSIS — M25562 Pain in left knee: Secondary | ICD-10-CM | POA: Diagnosis not present

## 2017-08-10 DIAGNOSIS — M79605 Pain in left leg: Secondary | ICD-10-CM

## 2017-08-10 DIAGNOSIS — M5386 Other specified dorsopathies, lumbar region: Secondary | ICD-10-CM

## 2017-08-10 DIAGNOSIS — R2681 Unsteadiness on feet: Secondary | ICD-10-CM

## 2017-08-10 DIAGNOSIS — M545 Low back pain, unspecified: Secondary | ICD-10-CM

## 2017-08-10 DIAGNOSIS — R262 Difficulty in walking, not elsewhere classified: Secondary | ICD-10-CM

## 2017-08-10 DIAGNOSIS — M2560 Stiffness of unspecified joint, not elsewhere classified: Secondary | ICD-10-CM

## 2017-08-10 DIAGNOSIS — M25561 Pain in right knee: Secondary | ICD-10-CM

## 2017-08-10 NOTE — Therapy (Signed)
[place] MAIN [facility] SERVICES [address] [place], [place], [address] Phone: [phone]   Fax:  [phone]  Physical Therapy Treatment  Patient Details  Name: [name] MRN: [id number] Date of Birth: 1945-10-05 No data recorded  Encounter Date: 08/10/2017  PT End of Session - 08/10/17 1745    Visit Number  55    Number of Visits  93    Date for PT Re-Evaluation  08/26/17    PT Start Time  1130    PT Stop Time  1220    PT Time Calculation (min)  50 min    Activity Tolerance  Patient tolerated treatment well    Behavior During Therapy  [facility] for tasks assessed/performed       Past Medical History:  Diagnosis Date  . Arthritis   . Depression     Past Surgical History:  Procedure Laterality Date  . ABDOMINAL HYSTERECTOMY  1995  . back sugery    . BUNIONECTOMY  2013   rt foot  . COLONOSCOPY    . MUSCLE BIOPSY Left 10/03/2012   Procedure: LEFT QUADRICEP MUSCLE BIOPSY;  Surgeon: Odis Hollingshead, MD;  Location: [place];  Service: General;  Laterality: Left;  . NECK SURGERY  2010   cerv disc fused     There were no vitals filed for this visit.  Subjective Assessment - 08/10/17 1742    Subjective  Pt reports missing last session, due to a minor fall due to tripping over a rug. Pt states both her husband and sister on 2 different occasions have commented that pt is walking better with less RLE drag and greater endurance.     Pertinent History  LE weakness.  Symptoms occured suddenly, unknown method of injury prior to her first neck fusion surgery on January 2010. Had lower back surgery fusion in 2011.  The neck and back surgeries did not help. Pt states having increased urinary urgency and takes medication for for it. MD aware.  Denies saddle anesthesia.  Pt states that her doctor told her that PT is the only thing that is going to help her keep moving so she continues to participate in PT.  Last round of PT was last  year which helped.  Currently has difficulty walking, performing chores (wash dishes, laundry), cooking. Better able to do her tasks a little bit when she does therapy but gets harder when she stops.  Feels burning and stinging in both her knees, and bilateral anterior and lateral legs.  Pt states not having back or neck pain. Just a stiff neck.  Pt states she usually walks with a cane on her R side. No falls within the last 6 months.        Enters/exits via ramp   Ambulation  Fwd 4 L  Side 2 L   Side with squat 2 L  Bench, core/LE strengthening, 1# ankle weights, B, 3 x 10 ea  SKTC, partial tuck  Straight leg, up and outs  Straight leg with hip ER, small outward circles  STS, no UE assist, 20x  Core with UE strengthening, Red dumbbells, 2 x 10 ea  Triceps ext  Sh abd/add  Sh flex/ext  Sh horiz abd/add  Balance  Side step with toss, 4 L  SLS with toe touch other, 1 min ea  Stretch B 3 x 15 sec ea  hamstrings  hip flexor  PT Education - 08/10/17 1744    Education provided  Yes    Education Details  Continued core, LE strength and balance work.           PT Long Term Goals - 07/15/17 1714      PT LONG TERM GOAL #1   Title  Pt will be independent with her HEP to promote LE strength in function.    Time  6    Period  Weeks    Status  On-going    Target Date  08/26/17      PT LONG TERM GOAL #2   Title  Patient will improve bilateral LE strength by at least 1/2 MMT grade to promote ability to ambulate and perform functional tasks.     Time  8    Period  Weeks    Status  Achieved      PT LONG TERM GOAL #3   Title  Patient will improve her LEFS score by at least 9 points as a demonstration of improved function.     Baseline  14/80 (07/29/2016); 18/80 (10/29/2016); 31/80 (12/09/2016); 18/80, pt however states increased bilateral knee pain recently (01/14/2017); 20/80 (04/12/17); 20/80 (05/20/2017); 26/80 (07/15/2017)    Time  8     Period  Weeks    Status  Achieved      PT LONG TERM GOAL #4   Title  Pt will report being able to walk/stand with SPC over 35 min to promote mobility.     Baseline  Pt states being able to walk for about 30 min (07/29/2016); about 1 hour per pt reports (09/07/2016)    Time  8    Period  Weeks    Status  Achieved      PT LONG TERM GOAL #5   Title  Patient will have a decrease in bilateral LE pain to 5/10 or less at worst to promote ability to perform functional tasks.     Baseline  8/10 at worst (07/29/2016); Minimal burning sensation bilateral knee and legs, no pain level number provided. Pt also states feeling pain in her knees during the weekend. No pain level provided (09/07/2016); 3/10 L leg burning sensation at most for the past 7 days (09/28/2016); bilateral knee burning sensation 8/10 at worst (10/29/2016); 7-8/10 bilateral knee pain, burning comes and goes (12/09/2016); 7-8/10 R knee, 5/10 L knee pain at most for the past 7 days (01/14/2017); 6-7/10 burning in L lower leg (04/12/17); 1-2/10 (05/18/2017); 5/10  L knee and leg burning at most , 0/10 R knee and leg burning at most for the past 7 days (07/15/2017)    Time  8    Period  Weeks    Status  Achieved      PT LONG TERM GOAL #6   Title  Pt will improve seated R hip flexion strength to at least 3+/5 to promote ability to ambulate and perform standing tasks.     Baseline  2+/5 seated R hip flexion (09/28/2016), (10/29/2016); (12/09/2016), (01/14/2017), (04/12/17); 2+/5 (05/20/2017), (07/15/2017)    Time  6    Period  Weeks    Status  On-going    Target Date  08/26/17      PT LONG TERM GOAL #7   Title  Pt will improve her LEFS score to 40/80 or more as a demonstration of improved function.     Baseline  31/80 (12/09/2016); 18/80, pt however states increased bilateral knee pain recently (01/14/2017); 20/80 (04/12/17), (05/20/2017); 26/80 (07/15/2016)  Time  6    Period  Weeks    Status  On-going    Target Date  08/26/17            Plan -  08/10/17 1747    Clinical Impression Statement  Pt tolerating treatment well. Progressing strength, quality of ambulation in the water and balance activities. Continue    Rehab Potential  Fair    Clinical Impairments Affecting Rehab Potential  Chronicity of condition    PT Frequency  2x / week    PT Duration  6 weeks    PT Treatment/Interventions  Electrical Stimulation;Aquatic Therapy;Ultrasound;Gait training;Functional mobility training;Therapeutic activities;Therapeutic exercise;Balance training;Neuromuscular re-education;Patient/family education;Manual techniques;Dry needling;Iontophoresis 4mg /ml Dexamethasone    PT Next Visit Plan  aquatherapy, LE strengthening, modalities PRN    Consulted and Agree with Plan of Care  Patient       Patient will benefit from skilled therapeutic intervention in order to improve the following deficits and impairments:  Pain, Abnormal gait, Decreased balance, Decreased range of motion, Decreased strength, Difficulty walking  Visit Diagnosis: Muscle weakness (generalized)  Difficulty in walking, not elsewhere classified  Pain in left leg  Right knee pain, unspecified chronicity  Left knee pain, unspecified chronicity  Pain in right leg  Unsteadiness on feet  Weakness of both legs  Abnormality of gait  Decreased ROM of lumbar spine  Unsteadiness  Joint stiffness of spine  Bilateral low back pain without sciatica, unspecified chronicity  Other abnormalities of gait and mobility     Problem List Patient Active Problem List   Diagnosis Date Noted  . Weakness 11/03/2012    Larae Grooms 08/10/2017, 5:49 PM  [place] MAIN [facility] SERVICES [address] [place], [place], [address] Phone: [phone]   Fax:  [phone]  Name: [name] MRN: [id number] Date of Birth: 1945-07-04

## 2017-08-12 ENCOUNTER — Other Ambulatory Visit: Payer: Self-pay

## 2017-08-12 ENCOUNTER — Ambulatory Visit: Payer: Medicare Other

## 2017-08-12 DIAGNOSIS — R262 Difficulty in walking, not elsewhere classified: Secondary | ICD-10-CM

## 2017-08-12 DIAGNOSIS — M25561 Pain in right knee: Secondary | ICD-10-CM

## 2017-08-12 DIAGNOSIS — M79605 Pain in left leg: Secondary | ICD-10-CM | POA: Diagnosis not present

## 2017-08-12 DIAGNOSIS — R29898 Other symptoms and signs involving the musculoskeletal system: Secondary | ICD-10-CM

## 2017-08-12 DIAGNOSIS — M2560 Stiffness of unspecified joint, not elsewhere classified: Secondary | ICD-10-CM

## 2017-08-12 DIAGNOSIS — M545 Low back pain, unspecified: Secondary | ICD-10-CM

## 2017-08-12 DIAGNOSIS — M79604 Pain in right leg: Secondary | ICD-10-CM | POA: Diagnosis not present

## 2017-08-12 DIAGNOSIS — M6281 Muscle weakness (generalized): Secondary | ICD-10-CM

## 2017-08-12 DIAGNOSIS — M25562 Pain in left knee: Secondary | ICD-10-CM | POA: Diagnosis not present

## 2017-08-12 DIAGNOSIS — R2681 Unsteadiness on feet: Secondary | ICD-10-CM

## 2017-08-12 DIAGNOSIS — R269 Unspecified abnormalities of gait and mobility: Secondary | ICD-10-CM

## 2017-08-12 DIAGNOSIS — M5386 Other specified dorsopathies, lumbar region: Secondary | ICD-10-CM

## 2017-08-12 DIAGNOSIS — R2689 Other abnormalities of gait and mobility: Secondary | ICD-10-CM

## 2017-08-12 NOTE — Therapy (Signed)
[place] MAIN [facility] SERVICES [address] [place], [place], [address] Phone: [phone]   Fax:  [phone]  Physical Therapy Treatment  Patient Details  Name: [name] MRN: [id number] Date of Birth: 13-Oct-1945 No data recorded  Encounter Date: 08/12/2017  PT End of Session - 08/12/17 1730    Visit Number  56    Number of Visits  77    Date for PT Re-Evaluation  08/26/17    PT Start Time  1130    PT Stop Time  1210    PT Time Calculation (min)  40 min    Activity Tolerance  Patient tolerated treatment well    Behavior During Therapy  [facility] for tasks assessed/performed       Past Medical History:  Diagnosis Date  . Arthritis   . Depression     Past Surgical History:  Procedure Laterality Date  . ABDOMINAL HYSTERECTOMY  1995  . back sugery    . BUNIONECTOMY  2013   rt foot  . COLONOSCOPY    . MUSCLE BIOPSY Left 10/03/2012   Procedure: LEFT QUADRICEP MUSCLE BIOPSY;  Surgeon: Odis Hollingshead, MD;  Location: [place];  Service: General;  Laterality: Left;  . NECK SURGERY  2010   cerv disc fused     There were no vitals filed for this visit.  Subjective Assessment - 08/12/17 1728    Subjective  No voiced complaints. Tolerated last session well    Pertinent History  LE weakness.  Symptoms occured suddenly, unknown method of injury prior to her first neck fusion surgery on January 2010. Had lower back surgery fusion in 2011.  The neck and back surgeries did not help. Pt states having increased urinary urgency and takes medication for for it. MD aware.  Denies saddle anesthesia.  Pt states that her doctor told her that PT is the only thing that is going to help her keep moving so she continues to participate in PT.  Last round of PT was last year which helped.  Currently has difficulty walking, performing chores (wash dishes, laundry), cooking. Better able to do her tasks a little bit when she does therapy but gets  harder when she stops.  Feels burning and stinging in both her knees, and bilateral anterior and lateral legs.  Pt states not having back or neck pain. Just a stiff neck.  Pt states she usually walks with a cane on her R side. No falls within the last 6 months.        Enters/exits via ramp  Ambulation, green dumbbells  Fwd, 4 L   Side, 2 L  Side with minisquat, 2L  High plank core dynamic work, 30x ea  Knee to chest  Single leg jack  Modified plank, B side, 3 x 45 sec each  Stand ball to knee tuck, 2 x 10 B  STS off bench, hip wide stance, no UE 20x  Bench, 5 min work  Stretching 3 x 10 sec each  hamstrings  hip flexor with overhead reach                         PT Education - 08/12/17 1729    Education provided  Yes    Education Details  Progression of plank and core work     [employer]) Educated  Patient    Methods  Explanation    Comprehension  Verbalized understanding;Returned demonstration;Verbal cues required  PT Long Term Goals - 07/15/17 1714      PT LONG TERM GOAL #1   Title  Pt will be independent with her HEP to promote LE strength in function.    Time  6    Period  Weeks    Status  On-going    Target Date  08/26/17      PT LONG TERM GOAL #2   Title  Patient will improve bilateral LE strength by at least 1/2 MMT grade to promote ability to ambulate and perform functional tasks.     Time  8    Period  Weeks    Status  Achieved      PT LONG TERM GOAL #3   Title  Patient will improve her LEFS score by at least 9 points as a demonstration of improved function.     Baseline  14/80 (07/29/2016); 18/80 (10/29/2016); 31/80 (12/09/2016); 18/80, pt however states increased bilateral knee pain recently (01/14/2017); 20/80 (04/12/17); 20/80 (05/20/2017); 26/80 (07/15/2017)    Time  8    Period  Weeks    Status  Achieved      PT LONG TERM GOAL #4   Title  Pt will report being able to walk/stand with SPC over 35 min to promote mobility.      Baseline  Pt states being able to walk for about 30 min (07/29/2016); about 1 hour per pt reports (09/07/2016)    Time  8    Period  Weeks    Status  Achieved      PT LONG TERM GOAL #5   Title  Patient will have a decrease in bilateral LE pain to 5/10 or less at worst to promote ability to perform functional tasks.     Baseline  8/10 at worst (07/29/2016); Minimal burning sensation bilateral knee and legs, no pain level number provided. Pt also states feeling pain in her knees during the weekend. No pain level provided (09/07/2016); 3/10 L leg burning sensation at most for the past 7 days (09/28/2016); bilateral knee burning sensation 8/10 at worst (10/29/2016); 7-8/10 bilateral knee pain, burning comes and goes (12/09/2016); 7-8/10 R knee, 5/10 L knee pain at most for the past 7 days (01/14/2017); 6-7/10 burning in L lower leg (04/12/17); 1-2/10 (05/18/2017); 5/10  L knee and leg burning at most , 0/10 R knee and leg burning at most for the past 7 days (07/15/2017)    Time  8    Period  Weeks    Status  Achieved      PT LONG TERM GOAL #6   Title  Pt will improve seated R hip flexion strength to at least 3+/5 to promote ability to ambulate and perform standing tasks.     Baseline  2+/5 seated R hip flexion (09/28/2016), (10/29/2016); (12/09/2016), (01/14/2017), (04/12/17); 2+/5 (05/20/2017), (07/15/2017)    Time  6    Period  Weeks    Status  On-going    Target Date  08/26/17      PT LONG TERM GOAL #7   Title  Pt will improve her LEFS score to 40/80 or more as a demonstration of improved function.     Baseline  31/80 (12/09/2016); 18/80, pt however states increased bilateral knee pain recently (01/14/2017); 20/80 (04/12/17), (05/20/2017); 26/80 (07/15/2016)    Time  6    Period  Weeks    Status  On-going    Target Date  08/26/17  Plan - 08/12/17 1730    Clinical Impression Statement  Tolerated progression with challenge, but well. Reports feeling "good work" post session.     Rehab Potential   Fair    Clinical Impairments Affecting Rehab Potential  Chronicity of condition    PT Frequency  2x / week    PT Duration  6 weeks    PT Treatment/Interventions  Electrical Stimulation;Aquatic Therapy;Ultrasound;Gait training;Functional mobility training;Therapeutic activities;Therapeutic exercise;Balance training;Neuromuscular re-education;Patient/family education;Manual techniques;Dry needling;Iontophoresis 4mg /ml Dexamethasone    PT Next Visit Plan  aquatherapy, LE strengthening, modalities PRN    Consulted and Agree with Plan of Care  Patient       Patient will benefit from skilled therapeutic intervention in order to improve the following deficits and impairments:  Pain, Abnormal gait, Decreased balance, Decreased range of motion, Decreased strength, Difficulty walking  Visit Diagnosis: Muscle weakness (generalized)  Difficulty in walking, not elsewhere classified  Pain in left leg  Right knee pain, unspecified chronicity  Left knee pain, unspecified chronicity  Pain in right leg  Unsteadiness on feet  Weakness of both legs  Abnormality of gait  Decreased ROM of lumbar spine  Unsteadiness  Joint stiffness of spine  Bilateral low back pain without sciatica, unspecified chronicity  Other abnormalities of gait and mobility     Problem List Patient Active Problem List   Diagnosis Date Noted  . Weakness 11/03/2012    Larae Grooms 08/12/2017, 5:32 PM  [place] MAIN [facility] SERVICES [address] [place], [place], [address] Phone: [phone]   Fax:  [phone]  Name: [name] MRN: [id number] Date of Birth: 1945/11/05

## 2017-08-17 ENCOUNTER — Ambulatory Visit: Payer: Medicare Other

## 2017-08-19 ENCOUNTER — Ambulatory Visit: Payer: Medicare Other

## 2017-08-19 ENCOUNTER — Other Ambulatory Visit: Payer: Self-pay

## 2017-08-19 DIAGNOSIS — M79605 Pain in left leg: Secondary | ICD-10-CM | POA: Diagnosis not present

## 2017-08-19 DIAGNOSIS — M2560 Stiffness of unspecified joint, not elsewhere classified: Secondary | ICD-10-CM

## 2017-08-19 DIAGNOSIS — R262 Difficulty in walking, not elsewhere classified: Secondary | ICD-10-CM

## 2017-08-19 DIAGNOSIS — M25561 Pain in right knee: Secondary | ICD-10-CM

## 2017-08-19 DIAGNOSIS — M25562 Pain in left knee: Secondary | ICD-10-CM

## 2017-08-19 DIAGNOSIS — R2681 Unsteadiness on feet: Secondary | ICD-10-CM

## 2017-08-19 DIAGNOSIS — M6281 Muscle weakness (generalized): Secondary | ICD-10-CM | POA: Diagnosis not present

## 2017-08-19 DIAGNOSIS — R269 Unspecified abnormalities of gait and mobility: Secondary | ICD-10-CM

## 2017-08-19 DIAGNOSIS — R2689 Other abnormalities of gait and mobility: Secondary | ICD-10-CM

## 2017-08-19 DIAGNOSIS — M545 Low back pain, unspecified: Secondary | ICD-10-CM

## 2017-08-19 DIAGNOSIS — M79604 Pain in right leg: Secondary | ICD-10-CM

## 2017-08-19 DIAGNOSIS — M5386 Other specified dorsopathies, lumbar region: Secondary | ICD-10-CM

## 2017-08-19 DIAGNOSIS — R29898 Other symptoms and signs involving the musculoskeletal system: Secondary | ICD-10-CM

## 2017-08-19 NOTE — Therapy (Signed)
[facility] [address] [place], [place], [address] Phone: [phone]   Fax:  [phone]  Physical Therapy Treatment  Patient Details  Name: [name] MRN: [id number] Date of Birth: 1945-10-19 No data recorded  Encounter Date: 08/19/2017  PT End of Session - 08/19/17 1626    Visit Number  52    Number of Visits  52    Date for PT Re-Evaluation  08/26/17    PT Start Time  1130    PT Stop Time  1210    PT Time Calculation (min)  40 min    Activity Tolerance  Patient tolerated treatment well    Behavior During Therapy  [facility] for tasks assessed/performed       Past Medical History:  Diagnosis Date  . Arthritis   . Depression     Past Surgical History:  Procedure Laterality Date  . ABDOMINAL HYSTERECTOMY  1995  . back sugery    . BUNIONECTOMY  2013   rt foot  . COLONOSCOPY    . MUSCLE BIOPSY Left 10/03/2012   Procedure: LEFT QUADRICEP MUSCLE BIOPSY;  Surgeon: Odis Hollingshead, MD;  Location: [place] [facility];  Service: General;  Laterality: Left;  . NECK SURGERY  2010   cerv disc fused     There were no vitals filed for this visit.  Subjective Assessment - 08/19/17 1624    Subjective  Pt running 15 min late today; feeling tired from trip over the weekend. Pt mildly tearful at times due to family stresses. No other specifc voiced complaints regarding LEs    Pertinent History  LE weakness.  Symptoms occured suddenly, unknown method of injury prior to her first neck fusion surgery on January 2010. Had lower back surgery fusion in 2011.  The neck and back surgeries did not help. Pt states having increased urinary urgency and takes medication for for it. MD aware.  Denies saddle anesthesia.  Pt states that her doctor told her that PT is the only thing that is going to help her keep moving so she continues to participate in PT.  Last round of PT was last year which helped.  Currently has difficulty walking,  performing chores (wash dishes, laundry), cooking. Better able to do her tasks a little bit when she does therapy but gets harder when she stops.  Feels burning and stinging in both her knees, and bilateral anterior and lateral legs.  Pt states not having back or neck pain. Just a stiff neck.  Pt states she usually walks with a cane on her R side. No falls within the last 6 months.        Enters/exits via ramp  Ambulation, blue dumbbells  Fwd 4 L   Side 4 L  LE/core with 2# wts, B   Hip abd/add at rail, 25x  Hip flex/ext at rail, 25x  Bench LE/core with 2# wts, B  Straight leg up and outs, 2 x 10 ea  SKTC, 2 x 10 ea  Bike, 5 min  High knee march walk with 2# ankle wts and noodle, 4L  Independent increased speed fwd walking x 10 min (no charge)                           PT Education - 08/19/17 1625    Education provided  Yes    Education Details  independent increased speed walking    Person(s) Educated  Patient    Methods  Explanation;Demonstration    Comprehension  Verbalized understanding;Returned demonstration          PT Long Term Goals - 07/15/17 1714      PT LONG TERM GOAL #1   Title  Pt will be independent with her HEP to promote LE strength in function.    Time  6    Period  Weeks    Status  On-going    Target Date  08/26/17      PT LONG TERM GOAL #2   Title  Patient will improve bilateral LE strength by at least 1/2 MMT grade to promote ability to ambulate and perform functional tasks.     Time  8    Period  Weeks    Status  Achieved      PT LONG TERM GOAL #3   Title  Patient will improve her LEFS score by at least 9 points as a demonstration of improved function.     Baseline  14/80 (07/29/2016); 18/80 (10/29/2016); 31/80 (12/09/2016); 18/80, pt however states increased bilateral knee pain recently (01/14/2017); 20/80 (04/12/17); 20/80 (05/20/2017); 26/80 (07/15/2017)    Time  8    Period  Weeks    Status  Achieved      PT LONG TERM  GOAL #4   Title  Pt will report being able to walk/stand with SPC over 35 min to promote mobility.     Baseline  Pt states being able to walk for about 30 min (07/29/2016); about 1 hour per pt reports (09/07/2016)    Time  8    Period  Weeks    Status  Achieved      PT LONG TERM GOAL #5   Title  Patient will have a decrease in bilateral LE pain to 5/10 or less at worst to promote ability to perform functional tasks.     Baseline  8/10 at worst (07/29/2016); Minimal burning sensation bilateral knee and legs, no pain level number provided. Pt also states feeling pain in her knees during the weekend. No pain level provided (09/07/2016); 3/10 L leg burning sensation at most for the past 7 days (09/28/2016); bilateral knee burning sensation 8/10 at worst (10/29/2016); 7-8/10 bilateral knee pain, burning comes and goes (12/09/2016); 7-8/10 R knee, 5/10 L knee pain at most for the past 7 days (01/14/2017); 6-7/10 burning in L lower leg (04/12/17); 1-2/10 (05/18/2017); 5/10  L knee and leg burning at most , 0/10 R knee and leg burning at most for the past 7 days (07/15/2017)    Time  8    Period  Weeks    Status  Achieved      PT LONG TERM GOAL #6   Title  Pt will improve seated R hip flexion strength to at least 3+/5 to promote ability to ambulate and perform standing tasks.     Baseline  2+/5 seated R hip flexion (09/28/2016), (10/29/2016); (12/09/2016), (01/14/2017), (04/12/17); 2+/5 (05/20/2017), (07/15/2017)    Time  6    Period  Weeks    Status  On-going    Target Date  08/26/17      PT LONG TERM GOAL #7   Title  Pt will improve her LEFS score to 40/80 or more as a demonstration of improved function.     Baseline  31/80 (12/09/2016); 18/80, pt however states increased bilateral knee pain recently (01/14/2017); 20/80 (04/12/17), (05/20/2017); 26/80 (07/15/2016)    Time  6    Period  Weeks  Status  On-going    Target Date  08/26/17            Plan - 08/19/17 1627    Clinical Impression Statement  Tolerated  session well with majority of exercise with 2# ankle wts demonstrating improved strength with LEs especially RLE (weaker side)    Rehab Potential  Fair    Clinical Impairments Affecting Rehab Potential  Chronicity of condition    PT Frequency  2x / week    PT Duration  6 weeks    PT Treatment/Interventions  Electrical Stimulation;Aquatic Therapy;Ultrasound;Gait training;Functional mobility training;Therapeutic activities;Therapeutic exercise;Balance training;Neuromuscular re-education;Patient/family education;Manual techniques;Dry needling;Iontophoresis 4mg /ml Dexamethasone    PT Next Visit Plan  aquatherapy, LE strengthening, modalities PRN    Consulted and Agree with Plan of Care  Patient       Patient will benefit from skilled therapeutic intervention in order to improve the following deficits and impairments:  Pain, Abnormal gait, Decreased balance, Decreased range of motion, Decreased strength, Difficulty walking  Visit Diagnosis: Muscle weakness (generalized)  Difficulty in walking, not elsewhere classified  Pain in left leg  Right knee pain, unspecified chronicity  Left knee pain, unspecified chronicity  Pain in right leg  Unsteadiness on feet  Other abnormalities of gait and mobility  Bilateral low back pain without sciatica, unspecified chronicity  Joint stiffness of spine  Unsteadiness  Decreased ROM of lumbar spine  Abnormality of gait  Weakness of both legs     Problem List Patient Active Problem List   Diagnosis Date Noted  . Weakness 11/03/2012    Larae Grooms 08/19/2017, 4:29 PM  [place] [facility] [address] [place], [place], [address] Phone: [phone]   Fax:  [phone]  Name: [name] MRN: [id number] Date of Birth: [birth year]

## 2017-08-20 ENCOUNTER — Emergency Department (HOSPITAL_COMMUNITY)
Admission: EM | Admit: 2017-08-20 | Discharge: 2017-08-20 | Disposition: A | Discharge status: Home / Self Care | Payer: Medicare Other | Attending: Emergency Medicine | Admitting: Emergency Medicine

## 2017-08-20 ENCOUNTER — Emergency Department (HOSPITAL_COMMUNITY): Payer: Medicare Other

## 2017-08-20 ENCOUNTER — Encounter (HOSPITAL_COMMUNITY): Payer: Self-pay | Admitting: Emergency Medicine

## 2017-08-20 DIAGNOSIS — M6281 Muscle weakness (generalized): Secondary | ICD-10-CM | POA: Diagnosis not present

## 2017-08-20 DIAGNOSIS — N3941 Urge incontinence: Secondary | ICD-10-CM | POA: Diagnosis not present

## 2017-08-20 DIAGNOSIS — K219 Gastro-esophageal reflux disease without esophagitis: Secondary | ICD-10-CM | POA: Diagnosis not present

## 2017-08-20 DIAGNOSIS — Z5321 Procedure and treatment not carried out due to patient leaving prior to being seen by health care provider: Secondary | ICD-10-CM | POA: Diagnosis not present

## 2017-08-20 DIAGNOSIS — G959 Disease of spinal cord, unspecified: Secondary | ICD-10-CM | POA: Diagnosis not present

## 2017-08-20 DIAGNOSIS — R002 Palpitations: Secondary | ICD-10-CM | POA: Insufficient documentation

## 2017-08-20 DIAGNOSIS — M5136 Other intervertebral disc degeneration, lumbar region: Secondary | ICD-10-CM | POA: Diagnosis not present

## 2017-08-20 LAB — BASIC METABOLIC PANEL
ANION GAP: 11 (ref 5–15)
BUN: 29 mg/dL — ABNORMAL HIGH (ref 6–20)
CALCIUM: 9.5 mg/dL (ref 8.9–10.3)
CO2: 23 mmol/L (ref 22–32)
Chloride: 105 mmol/L (ref 101–111)
Creatinine, Ser: 1.23 mg/dL — ABNORMAL HIGH (ref 0.44–1.00)
GFR calc Af Amer: 50 mL/min — ABNORMAL LOW (ref >60)
GFR, EST NON AFRICAN AMERICAN: 43 mL/min — AB (ref >60)
Glucose, Bld: 111 mg/dL — ABNORMAL HIGH (ref 65–99)
POTASSIUM: 4 mmol/L (ref 3.5–5.1)
Sodium: 139 mmol/L (ref 135–145)

## 2017-08-20 LAB — CBC
HCT: 43.7 % (ref 36.0–46.0)
HEMOGLOBIN: 13.9 g/dL (ref 12.0–15.0)
MCH: 28.7 pg (ref 26.0–34.0)
MCHC: 31.8 g/dL (ref 30.0–36.0)
MCV: 90.3 fL (ref 78.0–100.0)
Platelets: 253 10*3/uL (ref 150–400)
RBC: 4.84 MIL/uL (ref 3.87–5.11)
RDW: 15 % (ref 11.5–15.5)
WBC: 9 10*3/uL (ref 4.0–10.5)

## 2017-08-20 LAB — I-STAT TROPONIN, ED: TROPONIN I, POC: 0 ng/mL (ref 0.00–0.08)

## 2017-08-20 NOTE — ED Notes (Signed)
Patient states she has a doctor's appointment with her PCP at 1100 this morning. Patient leaving to go to appointment.

## 2017-08-20 NOTE — ED Triage Notes (Signed)
Pt reports heart palpitations for 1-2 weeks, denies chest pain, endorses some sob. Pt a/ox4.

## 2017-08-24 ENCOUNTER — Ambulatory Visit: Payer: Medicare Other

## 2017-09-01 DIAGNOSIS — M170 Bilateral primary osteoarthritis of knee: Secondary | ICD-10-CM | POA: Diagnosis not present

## 2017-09-01 DIAGNOSIS — M25561 Pain in right knee: Secondary | ICD-10-CM | POA: Diagnosis not present

## 2017-09-01 DIAGNOSIS — M1180 Other specified crystal arthropathies, unspecified site: Secondary | ICD-10-CM | POA: Diagnosis not present

## 2017-09-01 DIAGNOSIS — R768 Other specified abnormal immunological findings in serum: Secondary | ICD-10-CM | POA: Diagnosis not present

## 2017-09-01 DIAGNOSIS — M7120 Synovial cyst of popliteal space [Baker], unspecified knee: Secondary | ICD-10-CM | POA: Diagnosis not present

## 2017-09-01 DIAGNOSIS — Z0189 Encounter for other specified special examinations: Secondary | ICD-10-CM | POA: Diagnosis not present

## 2017-09-01 DIAGNOSIS — R002 Palpitations: Secondary | ICD-10-CM | POA: Diagnosis not present

## 2017-09-01 DIAGNOSIS — M25461 Effusion, right knee: Secondary | ICD-10-CM | POA: Diagnosis not present

## 2017-09-02 ENCOUNTER — Ambulatory Visit: Payer: Medicare Other | Attending: Physician Assistant

## 2017-09-02 DIAGNOSIS — M6281 Muscle weakness (generalized): Secondary | ICD-10-CM

## 2017-09-02 DIAGNOSIS — M79604 Pain in right leg: Secondary | ICD-10-CM | POA: Diagnosis not present

## 2017-09-02 DIAGNOSIS — M25561 Pain in right knee: Secondary | ICD-10-CM | POA: Diagnosis not present

## 2017-09-02 DIAGNOSIS — R262 Difficulty in walking, not elsewhere classified: Secondary | ICD-10-CM | POA: Insufficient documentation

## 2017-09-02 DIAGNOSIS — M25562 Pain in left knee: Secondary | ICD-10-CM | POA: Insufficient documentation

## 2017-09-02 DIAGNOSIS — M79605 Pain in left leg: Secondary | ICD-10-CM | POA: Diagnosis not present

## 2017-09-02 NOTE — Patient Instructions (Signed)
    Lie on side with hips and knees bent. Raise top knee up, squeezing glutes. Keep feet together.__5_ reps per set each side, _3-6__ sets per day  Make sure you breathe    Copyright  VHI. All rights reserved.

## 2017-09-02 NOTE — Therapy (Signed)
[facility] [address] [place], [address] Phone: [phone]   Fax:  [phone]  Physical Therapy Treatment And Progress Report  Patient Details  Name: [name] MRN: [id number] Date of Birth: [birth year] Referring Provider: Shary Decamp, [place]   Encounter Date: 09/02/2017  PT End of Session - 09/02/17 1337    Visit Number  53    Number of Visits  109    Date for PT Re-Evaluation  10/28/17    PT Start Time  6962 pt arrived late    PT Stop Time  1419    PT Time Calculation (min)  42 min    Activity Tolerance  Patient tolerated treatment well    Behavior During Therapy  [facility] for tasks assessed/performed       Past Medical History:  Diagnosis Date  . Arthritis   . Depression     Past Surgical History:  Procedure Laterality Date  . ABDOMINAL HYSTERECTOMY  1995  . back sugery    . BUNIONECTOMY  2013   rt foot  . COLONOSCOPY    . MUSCLE BIOPSY Left 10/03/2012   Procedure: LEFT QUADRICEP MUSCLE BIOPSY;  Surgeon: Odis Hollingshead, MD;  Location: [facility] [facility];  Service: General;  Laterality: Left;  . NECK SURGERY  2010   cerv disc fused     There were no vitals filed for this visit.  Subjective Assessment - 09/02/17 1339    Subjective  Pt states that the water therapy keeps her more limber, not as stiff. Has seen a difference. Pt states that she no longer has silver sneakers in her insurance.  Feels more stiff after not going to water therapy for about 2 weeks. Currently wearing a heart monitor until 09/14/2017. Kept having heart palptitations. Also has a ganglion cyst behind R leg. Currently on medication for it.  Felt like she was able to pick up her R leg higher on land when pt participated in water therapy.   No pain currently.  Just feels burning in her knee joints.  Pt states that she was told by doctors that she will need therapy for the rest of her life.      Pertinent History   LE weakness.  Symptoms occured suddenly, unknown method of injury prior to her first neck fusion surgery on January 2010. Had lower back surgery fusion in 2011.  The neck and back surgeries did not help. Pt states having increased urinary urgency and takes medication for for it. MD aware.  Denies saddle anesthesia.  Pt states that her doctor told her that PT is the only thing that is going to help her keep moving so she continues to participate in PT.  Last round of PT was last year which helped.  Currently has difficulty walking, performing chores (wash dishes, laundry), cooking. Better able to do her tasks a little bit when she does therapy but gets harder when she stops.  Feels burning and stinging in both her knees, and bilateral anterior and lateral legs.  Pt states not having back or neck pain. Just a stiff neck.  Pt states she usually walks with a cane on her R side. No falls within the last 6 months.      Currently in Pain?  No/denies    Pain Score  0-No pain         [facility] PT Assessment - 09/02/17 1347      Assessment  Referring Provider  Art Buff Burne, PA      Observation/Other Assessments   Lower Extremity Functional Scale   35/80      Strength   Right Hip Flexion  2+/5                           PT Education - 09/02/17 1411    Education provided  Yes    Education Details  ther-ex, plan of care, HEP    Person(s) Educated  Patient    Methods  Explanation;Demonstration;Tactile cues;Verbal cues;Handout    Comprehension  Returned demonstration;Verbalized understanding         Objectives     MedBridge Access Code: [email or web address]  Pt states that she feels better since aquatherapy     Therapeutic exercise  reviewed plan of care: continue 2x/week for 8 weeks, mostly aquatherapy due to good results per pt subjective  Blood pressure L arm sitting, mechanically taken: 132/65, HR 67   Supine SLR R hip flexion 10x2. Able to perform without  assist  Supine marches 10x4 each L. PT assist first 2 repetitions   S/L R hip abduction with PT assist for 2 sets 5x3  S/L clamshells 5x3  Reviewed and given as part of her HEP.   Seated physioball rolls  Forward for trunk flexion 10x5 seconds for 2 sets  Then to the L 10x5 seconds  Seated R hip flexion 10x  Improved exercise technique, movement at target joints, use of target muscles after min to mod verbal, visual, tactile cues.   Pt seems to be improving overall with R LE function and endurance based on subjective reports of family members noticing her pick up her R LE better when participating in aqua therapy (per note on 08/10/2017). Difficulty picking up R thigh today in which pt states she has not been in aquatherapy for the past 2 weeks. Pt however able to perform supine SLR R hip flexion without PT assist, and supine R hip march/flexion with only PT assist for first 2 repetitions. Pt seems to be making some progress, though very slow. Pt also continues to have decreased bilateral knee and leg pain with participation in PT at the pool. Pt will benefit from continued skilled physical therapy services to promote ability to pick up her R LE during gait, improve strength, and ability to perform standing tasks.          PT Long Term Goals - 09/02/17 1350      PT LONG TERM GOAL #1   Title  Pt will be independent with her HEP to promote LE strength in function.    Time  8    Period  Weeks    Status  On-going    Target Date  10/28/17      PT LONG TERM GOAL #2   Title  Patient will improve bilateral LE strength by at least 1/2 MMT grade to promote ability to ambulate and perform functional tasks.     Time  8    Period  Weeks    Status  Achieved      PT LONG TERM GOAL #3   Title  Patient will improve her LEFS score by at least 9 points as a demonstration of improved function.     Baseline  14/80 (07/29/2016); 18/80 (10/29/2016); 31/80 (12/09/2016); 18/80, pt however states  increased bilateral knee pain recently (01/14/2017); 20/80 (04/12/17); 20/80 (05/20/2017); 26/80 (07/15/2017)    Time  8  Period  Weeks    Status  Achieved      PT LONG TERM GOAL #4   Title  Pt will report being able to walk/stand with SPC over 35 min to promote mobility.     Baseline  Pt states being able to walk for about 30 min (07/29/2016); about 1 hour per pt reports (09/07/2016)    Time  8    Period  Weeks    Status  Achieved      PT LONG TERM GOAL #5   Title  Patient will have a decrease in bilateral LE pain to 5/10 or less at worst to promote ability to perform functional tasks.     Baseline  8/10 at worst (07/29/2016); Minimal burning sensation bilateral knee and legs, no pain level number provided. Pt also states feeling pain in her knees during the weekend. No pain level provided (09/07/2016); 3/10 L leg burning sensation at most for the past 7 days (09/28/2016); bilateral knee burning sensation 8/10 at worst (10/29/2016); 7-8/10 bilateral knee pain, burning comes and goes (12/09/2016); 7-8/10 R knee, 5/10 L knee pain at most for the past 7 days (01/14/2017); 6-7/10 burning in L lower leg (04/12/17); 1-2/10 (05/18/2017); 5/10  L knee and leg burning at most , 0/10 R knee and leg burning at most for the past 7 days (07/15/2017); No pain, just the burning mainly at the knees (09/02/2017)    Time  8    Period  Weeks    Status  Achieved      PT LONG TERM GOAL #6   Title  Pt will improve seated R hip flexion strength to at least 3+/5 to promote ability to ambulate and perform standing tasks.     Baseline  2+/5 seated R hip flexion (09/28/2016), (10/29/2016); (12/09/2016), (01/14/2017), (04/12/17); 2+/5 (05/20/2017), (07/15/2017), (09/02/2017)    Time  8    Period  Weeks    Status  On-going    Target Date  10/28/17      PT LONG TERM GOAL #7   Title  Pt will improve her LEFS score to 40/80 or more as a demonstration of improved function.     Baseline  31/80 (12/09/2016); 18/80, pt however states increased  bilateral knee pain recently (01/14/2017); 20/80 (04/12/17), (05/20/2017); 26/80 (07/15/2016); 35/80 (09/02/2017)    Time  8    Period  Weeks    Status  On-going    Target Date  10/28/17            Plan - 09/02/17 1411    Clinical Impression Statement  Pt seems to be improving overall with R LE function and endurance based on subjective reports of family members noticing her pick up her R LE better when participating in aqua therapy (per note on 08/10/2017). Difficulty picking up R thigh today in which pt states she has not been in aquatherapy for the past 2 weeks. Pt however able to perform supine SLR R hip flexion without PT assist, and supine R hip march/flexion with only PT assist for first 2 repetitions. Pt seems to be making some progress, though very slow. Pt also continues to have decreased bilateral knee and leg pain with participation in PT at the pool. Pt will benefit from continued skilled physical therapy services to promote ability to pick up her R LE during gait, improve strength, and ability to perform standing tasks.     History and Personal Factors relevant to plan of care:  Chronicity of condition, weakness  Clinical Presentation  Stable    Clinical Presentation due to:  continues to have decreased knee and leg pain bilaterally, some improved ability to use R hip flexor muscles    Clinical Decision Making  Low    Rehab Potential  Fair    Clinical Impairments Affecting Rehab Potential  Chronicity of condition, weakness    PT Frequency  2x / week    PT Duration  8 weeks    PT Treatment/Interventions  Electrical Stimulation;Aquatic Therapy;Ultrasound;Gait training;Functional mobility training;Therapeutic activities;Therapeutic exercise;Balance training;Neuromuscular re-education;Patient/family education;Manual techniques;Dry needling;Iontophoresis 4mg /ml Dexamethasone    PT Next Visit Plan  aquatherapy, LE strengthening, modalities PRN    Consulted and Agree with Plan of Care   Patient       Patient will benefit from skilled therapeutic intervention in order to improve the following deficits and impairments:  Pain, Abnormal gait, Decreased balance, Decreased range of motion, Decreased strength, Difficulty walking  Visit Diagnosis: Muscle weakness (generalized) - Plan: PT plan of care cert/re-cert  Difficulty in walking, not elsewhere classified - Plan: PT plan of care cert/re-cert  Pain in left leg - Plan: PT plan of care cert/re-cert  Right knee pain, unspecified chronicity - Plan: PT plan of care cert/re-cert  Left knee pain, unspecified chronicity - Plan: PT plan of care cert/re-cert  Pain in right leg - Plan: PT plan of care cert/re-cert     Problem List Patient Active Problem List   Diagnosis Date Noted  . Weakness 11/03/2012    Thank you for your referral.  [name] PT, DPT   09/02/2017, 7:11 PM  [facility] [address] [place], [address] Phone: [phone]   Fax:  [phone]  Name: KENDA KLOEHN MRN: [id number] Date of Birth: 12-Apr-1945

## 2017-09-07 DIAGNOSIS — L97512 Non-pressure chronic ulcer of other part of right foot with fat layer exposed: Secondary | ICD-10-CM | POA: Diagnosis not present

## 2017-09-14 ENCOUNTER — Ambulatory Visit: Payer: Medicare Other

## 2017-09-14 DIAGNOSIS — M79604 Pain in right leg: Secondary | ICD-10-CM | POA: Diagnosis not present

## 2017-09-14 DIAGNOSIS — R262 Difficulty in walking, not elsewhere classified: Secondary | ICD-10-CM

## 2017-09-14 DIAGNOSIS — M2041 Other hammer toe(s) (acquired), right foot: Secondary | ICD-10-CM | POA: Diagnosis not present

## 2017-09-14 DIAGNOSIS — M25562 Pain in left knee: Secondary | ICD-10-CM

## 2017-09-14 DIAGNOSIS — M79605 Pain in left leg: Secondary | ICD-10-CM | POA: Diagnosis not present

## 2017-09-14 DIAGNOSIS — M25561 Pain in right knee: Secondary | ICD-10-CM | POA: Diagnosis not present

## 2017-09-14 DIAGNOSIS — L97511 Non-pressure chronic ulcer of other part of right foot limited to breakdown of skin: Secondary | ICD-10-CM | POA: Diagnosis not present

## 2017-09-14 DIAGNOSIS — R002 Palpitations: Secondary | ICD-10-CM | POA: Diagnosis not present

## 2017-09-14 DIAGNOSIS — M6281 Muscle weakness (generalized): Secondary | ICD-10-CM | POA: Diagnosis not present

## 2017-09-14 NOTE — Therapy (Signed)
[facility] [address] [place], [address] Phone: [phone]   Fax:  [phone]  Physical Therapy Treatment  Patient Details  Name: [name] MRN: [id number] Date of Birth: Dec 11, 1945 Referring Provider: Shary Decamp, [place]   Encounter Date: 09/14/2017  PT End of Session - 09/14/17 1357    Visit Number  59    Number of Visits  109    Date for PT Re-Evaluation  10/28/17    PT Start Time  1359 pt arrived late    PT Stop Time  1434    PT Time Calculation (min)  35 min    Activity Tolerance  Patient tolerated treatment well    Behavior During Therapy  [facility] for tasks assessed/performed       Past Medical History:  Diagnosis Date  . Arthritis   . Depression     Past Surgical History:  Procedure Laterality Date  . ABDOMINAL HYSTERECTOMY  1995  . back sugery    . BUNIONECTOMY  2013   rt foot  . COLONOSCOPY    . MUSCLE BIOPSY Left 10/03/2012   Procedure: LEFT QUADRICEP MUSCLE BIOPSY;  Surgeon: Odis Hollingshead, MD;  Location: [facility];  Service: General;  Laterality: Left;  . NECK SURGERY  2010   cerv disc fused     There were no vitals filed for this visit.  Subjective Assessment - 09/14/17 1402    Subjective  Both knees are bothering her today. 8/10 bilateral knee pain currently. Has been using her new SPC for the past 4-5 days. Knees have been bothering her for about a week.   The aqua therapy helps with her knees.     Pertinent History  LE weakness.  Symptoms occured suddenly, unknown method of injury prior to her first neck fusion surgery on January 2010. Had lower back surgery fusion in 2011.  The neck and back surgeries did not help. Pt states having increased urinary urgency and takes medication for for it. MD aware.  Denies saddle anesthesia.  Pt states that her doctor told her that PT is the only thing that is going to help her keep moving so she continues to participate  in PT.  Last round of PT was last year which helped.  Currently has difficulty walking, performing chores (wash dishes, laundry), cooking. Better able to do her tasks a little bit when she does therapy but gets harder when she stops.  Feels burning and stinging in both her knees, and bilateral anterior and lateral legs.  Pt states not having back or neck pain. Just a stiff neck.  Pt states she usually walks with a cane on her R side. No falls within the last 6 months.      Currently in Pain?  Yes    Pain Score  8                                PT Education - 09/14/17 2006    Education provided  Yes    Education Details  SPC height    Person(s) Educated  Patient    Methods  Explanation    Comprehension  Verbalized understanding         Objectives     MedBridge Access Code: [email or web address]  Pt states that she feels better since aquatherapy     Gait  Adjusted Pt new SPC to  proper height. Ptstates the [facility] feels better with walking      Manual therapy  Seated STM R latearl hamstrings to decrease tension  Seated STM to L lateral hamstrings to decrease tension  Supine gentle manual IR to tibia ER to femur grade 1 to 3- R and L for pain control    Worked on decreasing bilateral knee pain to improve ability to ambulate more comfortably. Decreased bilateral knee pain to 5.5/10 to 6/10 after manual therapy. Pt states knees feel better.          PT Long Term Goals - 09/02/17 1350      PT LONG TERM GOAL #1   Title  Pt will be independent with her HEP to promote LE strength in function.    Time  8    Period  Weeks    Status  On-going    Target Date  10/28/17      PT LONG TERM GOAL #2   Title  Patient will improve bilateral LE strength by at least 1/2 MMT grade to promote ability to ambulate and perform functional tasks.     Time  8    Period  Weeks    Status  Achieved      PT LONG TERM GOAL #3   Title  Patient will improve her LEFS  score by at least 9 points as a demonstration of improved function.     Baseline  14/80 (07/29/2016); 18/80 (10/29/2016); 31/80 (12/09/2016); 18/80, pt however states increased bilateral knee pain recently (01/14/2017); 20/80 (04/12/17); 20/80 (05/20/2017); 26/80 (07/15/2017)    Time  8    Period  Weeks    Status  Achieved      PT LONG TERM GOAL #4   Title  Pt will report being able to walk/stand with SPC over 35 min to promote mobility.     Baseline  Pt states being able to walk for about 30 min (07/29/2016); about 1 hour per pt reports (09/07/2016)    Time  8    Period  Weeks    Status  Achieved      PT LONG TERM GOAL #5   Title  Patient will have a decrease in bilateral LE pain to 5/10 or less at worst to promote ability to perform functional tasks.     Baseline  8/10 at worst (07/29/2016); Minimal burning sensation bilateral knee and legs, no pain level number provided. Pt also states feeling pain in her knees during the weekend. No pain level provided (09/07/2016); 3/10 L leg burning sensation at most for the past 7 days (09/28/2016); bilateral knee burning sensation 8/10 at worst (10/29/2016); 7-8/10 bilateral knee pain, burning comes and goes (12/09/2016); 7-8/10 R knee, 5/10 L knee pain at most for the past 7 days (01/14/2017); 6-7/10 burning in L lower leg (04/12/17); 1-2/10 (05/18/2017); 5/10  L knee and leg burning at most , 0/10 R knee and leg burning at most for the past 7 days (07/15/2017); No pain, just the burning mainly at the knees (09/02/2017)    Time  8    Period  Weeks    Status  Achieved      PT LONG TERM GOAL #6   Title  Pt will improve seated R hip flexion strength to at least 3+/5 to promote ability to ambulate and perform standing tasks.     Baseline  2+/5 seated R hip flexion (09/28/2016), (10/29/2016); (12/09/2016), (01/14/2017), (04/12/17); 2+/5 (05/20/2017), (07/15/2017), (09/02/2017)    Time  8  Period  Weeks    Status  On-going    Target Date  10/28/17      PT LONG TERM GOAL #7   Title   Pt will improve her LEFS score to 40/80 or more as a demonstration of improved function.     Baseline  31/80 (12/09/2016); 18/80, pt however states increased bilateral knee pain recently (01/14/2017); 20/80 (04/12/17), (05/20/2017); 26/80 (07/15/2016); 35/80 (09/02/2017)    Time  8    Period  Weeks    Status  On-going    Target Date  10/28/17            Plan - 09/14/17 2002    Clinical Impression Statement  Worked on decreasing bilateral knee pain to improve ability to ambulate more comfortably. Decreased bilateral knee pain to 5.5/10 to 6/10 after manual therapy. Pt states knees feel better.     Rehab Potential  Fair    Clinical Impairments Affecting Rehab Potential  Chronicity of condition, weakness    PT Frequency  2x / week    PT Duration  8 weeks    PT Treatment/Interventions  Electrical Stimulation;Aquatic Therapy;Ultrasound;Gait training;Functional mobility training;Therapeutic activities;Therapeutic exercise;Balance training;Neuromuscular re-education;Patient/family education;Manual techniques;Dry needling;Iontophoresis 4mg /ml Dexamethasone    PT Next Visit Plan  aquatherapy, LE strengthening, modalities PRN    Consulted and Agree with Plan of Care  Patient       Patient will benefit from skilled therapeutic intervention in order to improve the following deficits and impairments:  Pain, Abnormal gait, Decreased balance, Decreased range of motion, Decreased strength, Difficulty walking  Visit Diagnosis: Difficulty in walking, not elsewhere classified  Right knee pain, unspecified chronicity  Left knee pain, unspecified chronicity     Problem List Patient Active Problem List   Diagnosis Date Noted  . Weakness 11/03/2012    [name] PT, DPT   09/14/2017, 8:07 PM  [place] [facility] [address] [place], [address] Phone: [phone]   Fax:  [phone]  Name: [name] MRN: [id number] Date  of Birth: [birth year]

## 2017-09-21 ENCOUNTER — Encounter

## 2017-09-28 ENCOUNTER — Encounter

## 2017-09-30 ENCOUNTER — Ambulatory Visit

## 2017-10-04 ENCOUNTER — Ambulatory Visit (INDEPENDENT_AMBULATORY_CARE_PROVIDER_SITE_OTHER): Payer: Medicare Other | Admitting: Orthopaedic Surgery

## 2017-10-04 DIAGNOSIS — Z1231 Encounter for screening mammogram for malignant neoplasm of breast: Secondary | ICD-10-CM | POA: Diagnosis not present

## 2017-10-04 DIAGNOSIS — Z803 Family history of malignant neoplasm of breast: Secondary | ICD-10-CM | POA: Diagnosis not present

## 2017-10-05 ENCOUNTER — Other Ambulatory Visit: Payer: Self-pay

## 2017-10-05 ENCOUNTER — Ambulatory Visit: Payer: Medicare Other | Attending: Physician Assistant

## 2017-10-05 DIAGNOSIS — R262 Difficulty in walking, not elsewhere classified: Secondary | ICD-10-CM | POA: Diagnosis not present

## 2017-10-05 DIAGNOSIS — M545 Low back pain: Secondary | ICD-10-CM | POA: Diagnosis not present

## 2017-10-05 DIAGNOSIS — R29898 Other symptoms and signs involving the musculoskeletal system: Secondary | ICD-10-CM | POA: Diagnosis not present

## 2017-10-05 DIAGNOSIS — M2560 Stiffness of unspecified joint, not elsewhere classified: Secondary | ICD-10-CM | POA: Diagnosis not present

## 2017-10-05 DIAGNOSIS — M79605 Pain in left leg: Secondary | ICD-10-CM | POA: Diagnosis not present

## 2017-10-05 DIAGNOSIS — R208 Other disturbances of skin sensation: Secondary | ICD-10-CM

## 2017-10-05 DIAGNOSIS — R2681 Unsteadiness on feet: Secondary | ICD-10-CM | POA: Diagnosis not present

## 2017-10-05 DIAGNOSIS — M6281 Muscle weakness (generalized): Secondary | ICD-10-CM | POA: Diagnosis not present

## 2017-10-05 DIAGNOSIS — M25562 Pain in left knee: Secondary | ICD-10-CM | POA: Insufficient documentation

## 2017-10-05 DIAGNOSIS — M79604 Pain in right leg: Secondary | ICD-10-CM | POA: Diagnosis not present

## 2017-10-05 DIAGNOSIS — M25561 Pain in right knee: Secondary | ICD-10-CM | POA: Insufficient documentation

## 2017-10-05 DIAGNOSIS — M5386 Other specified dorsopathies, lumbar region: Secondary | ICD-10-CM

## 2017-10-05 DIAGNOSIS — R269 Unspecified abnormalities of gait and mobility: Secondary | ICD-10-CM

## 2017-10-05 DIAGNOSIS — R2689 Other abnormalities of gait and mobility: Secondary | ICD-10-CM

## 2017-10-05 NOTE — Therapy (Signed)
[place] [facility] [address] [place], [place], [address] Phone: [phone]   Fax:  [phone]  Physical Therapy Treatment  Patient Details  Name: [name] MRN: [id number] Date of Birth: Apr 15, 1945 Referring Provider: Shary Decamp, [place]   Encounter Date: 10/05/2017  PT End of Session - 10/05/17 1505    Visit Number  60    Number of Visits  109    Date for PT Re-Evaluation  10/28/17    PT Start Time  0900    PT Stop Time  1000    PT Time Calculation (min)  60 min    Activity Tolerance  Patient tolerated treatment well    Behavior During Therapy  [facility] for tasks assessed/performed       Past Medical History:  Diagnosis Date  . Arthritis   . Depression     Past Surgical History:  Procedure Laterality Date  . ABDOMINAL HYSTERECTOMY  1995  . back sugery    . BUNIONECTOMY  2013   rt foot  . COLONOSCOPY    . MUSCLE BIOPSY Left 10/03/2012   Procedure: LEFT QUADRICEP MUSCLE BIOPSY;  Surgeon: Odis Hollingshead, MD;  Location: [facility];  Service: General;  Laterality: Left;  . NECK SURGERY  2010   cerv disc fused     There were no vitals filed for this visit.  Subjective Assessment - 10/05/17 1501    Subjective  pt returns after nearly a month from last session. Pt reports no pain; continued LE weakness R > L. Pt does note feeling a difference/worsening in strength and ability to function without use of water for exercises    Pertinent History  LE weakness.  Symptoms occured suddenly, unknown method of injury prior to her first neck fusion surgery on January 2010. Had lower back surgery fusion in 2011.  The neck and back surgeries did not help. Pt states having increased urinary urgency and takes medication for for it. MD aware.  Denies saddle anesthesia.  Pt states that her doctor told her that PT is the only thing that is going to help her keep moving so she continues to participate in PT.  Last round  of PT was last year which helped.  Currently has difficulty walking, performing chores (wash dishes, laundry), cooking. Better able to do her tasks a little bit when she does therapy but gets harder when she stops.  Feels burning and stinging in both her knees, and bilateral anterior and lateral legs.  Pt states not having back or neck pain. Just a stiff neck.  Pt states she usually walks with a cane on her R side. No falls within the last 6 months.        Enters/exits via ramp  Ambulation, blue dumbbells  Fwd 4 L  side 4 L   side with minisquat 4 L  Core with LE kicks, B 20x ea  side  fwd/bkwd  Core with UE strength, red dumbbells, 20x ea  Triceps press downs  Sh ab/adduction  Sh flex/ext  Horiz ab/adduction  LE strength, 20x ea  Squats   Bench, core and LE  Bike, 3 min  Scissor, 3 min  Flutter, 3 min   Suspended work 1 min ea; requires CGA  [employer]  Ski   Walk with increased speed  Fwd 3L  Side 3L  PT Education - 10/05/17 1504    Education provided  Yes    Education Details  Review of posture, strengthening and cardio work in [employer]) Educated  Patient    Methods  Explanation;Demonstration    Comprehension  Verbalized understanding;Returned demonstration;Verbal cues required          PT Long Term Goals - 09/02/17 1350      PT LONG TERM GOAL #1   Title  Pt will be independent with her HEP to promote LE strength in function.    Time  8    Period  Weeks    Status  On-going    Target Date  10/28/17      PT LONG TERM GOAL #2   Title  Patient will improve bilateral LE strength by at least 1/2 MMT grade to promote ability to ambulate and perform functional tasks.     Time  8    Period  Weeks    Status  Achieved      PT LONG TERM GOAL #3   Title  Patient will improve her LEFS score by at least 9 points as a demonstration of improved function.     Baseline  14/80 (07/29/2016); 18/80 (10/29/2016);  31/80 (12/09/2016); 18/80, pt however states increased bilateral knee pain recently (01/14/2017); 20/80 (04/12/17); 20/80 (05/20/2017); 26/80 (07/15/2017)    Time  8    Period  Weeks    Status  Achieved      PT LONG TERM GOAL #4   Title  Pt will report being able to walk/stand with SPC over 35 min to promote mobility.     Baseline  Pt states being able to walk for about 30 min (07/29/2016); about 1 hour per pt reports (09/07/2016)    Time  8    Period  Weeks    Status  Achieved      PT LONG TERM GOAL #5   Title  Patient will have a decrease in bilateral LE pain to 5/10 or less at worst to promote ability to perform functional tasks.     Baseline  8/10 at worst (07/29/2016); Minimal burning sensation bilateral knee and legs, no pain level number provided. Pt also states feeling pain in her knees during the weekend. No pain level provided (09/07/2016); 3/10 L leg burning sensation at most for the past 7 days (09/28/2016); bilateral knee burning sensation 8/10 at worst (10/29/2016); 7-8/10 bilateral knee pain, burning comes and goes (12/09/2016); 7-8/10 R knee, 5/10 L knee pain at most for the past 7 days (01/14/2017); 6-7/10 burning in L lower leg (04/12/17); 1-2/10 (05/18/2017); 5/10  L knee and leg burning at most , 0/10 R knee and leg burning at most for the past 7 days (07/15/2017); No pain, just the burning mainly at the knees (09/02/2017)    Time  8    Period  Weeks    Status  Achieved      PT LONG TERM GOAL #6   Title  Pt will improve seated R hip flexion strength to at least 3+/5 to promote ability to ambulate and perform standing tasks.     Baseline  2+/5 seated R hip flexion (09/28/2016), (10/29/2016); (12/09/2016), (01/14/2017), (04/12/17); 2+/5 (05/20/2017), (07/15/2017), (09/02/2017)    Time  8    Period  Weeks    Status  On-going    Target Date  10/28/17      PT LONG TERM GOAL #7   Title  Pt will improve her LEFS  score to 40/80 or more as a demonstration of improved function.     Baseline  31/80  (12/09/2016); 18/80, pt however states increased bilateral knee pain recently (01/14/2017); 20/80 (04/12/17), (05/20/2017); 26/80 (07/15/2016); 35/80 (09/02/2017)    Time  8    Period  Weeks    Status  On-going    Target Date  10/28/17            Plan - 10/05/17 1505    Clinical Impression Statement  Pt tolerated session well. Able to perform exercises well with re instruction and verbal cues. Suspended work or other positions for cardio/strength work continue challenging. Pt did demonstrate ability to perform ambulation with mild increased speed for increased strength/cardio work. Continue to progress concepts to work toward sustainable aquatic HEP, as pt would benefit from ongoing aquatic exercise due to land exercises to difficult.     Rehab Potential  Fair    Clinical Impairments Affecting Rehab Potential  Chronicity of condition, weakness    PT Frequency  2x / week    PT Duration  8 weeks    PT Treatment/Interventions  Electrical Stimulation;Aquatic Therapy;Ultrasound;Gait training;Functional mobility training;Therapeutic activities;Therapeutic exercise;Balance training;Neuromuscular re-education;Patient/family education;Manual techniques;Dry needling;Iontophoresis 4mg /ml Dexamethasone    PT Next Visit Plan  aquatherapy, LE strengthening, modalities PRN    Consulted and Agree with Plan of Care  Patient       Patient will benefit from skilled therapeutic intervention in order to improve the following deficits and impairments:  Pain, Abnormal gait, Decreased balance, Decreased range of motion, Decreased strength, Difficulty walking  Visit Diagnosis: Difficulty in walking, not elsewhere classified  Muscle weakness (generalized)  Unsteadiness on feet  Other abnormalities of gait and mobility  Unsteadiness  Decreased ROM of lumbar spine  Abnormality of gait  Weakness of both legs  Other disturbances of skin sensation  Joint stiffness of spine     Problem List Patient  Active Problem List   Diagnosis Date Noted  . Weakness 11/03/2012    Larae Grooms 10/05/2017, 3:10 PM  [place] [facility] [address] [place], [place], [address] Phone: [phone]   Fax:  [phone]  Name: [name] MRN: [id number] Date of Birth: October 18, 1945

## 2017-10-06 DIAGNOSIS — Z0189 Encounter for other specified special examinations: Secondary | ICD-10-CM | POA: Diagnosis not present

## 2017-10-06 DIAGNOSIS — R002 Palpitations: Secondary | ICD-10-CM | POA: Diagnosis not present

## 2017-10-06 DIAGNOSIS — R0683 Snoring: Secondary | ICD-10-CM | POA: Diagnosis not present

## 2017-10-07 ENCOUNTER — Ambulatory Visit: Payer: Medicare Other

## 2017-10-12 ENCOUNTER — Ambulatory Visit: Payer: Medicare Other

## 2017-10-12 DIAGNOSIS — R262 Difficulty in walking, not elsewhere classified: Secondary | ICD-10-CM

## 2017-10-12 DIAGNOSIS — R269 Unspecified abnormalities of gait and mobility: Secondary | ICD-10-CM | POA: Diagnosis not present

## 2017-10-12 DIAGNOSIS — R2681 Unsteadiness on feet: Secondary | ICD-10-CM | POA: Diagnosis not present

## 2017-10-12 DIAGNOSIS — M5386 Other specified dorsopathies, lumbar region: Secondary | ICD-10-CM | POA: Diagnosis not present

## 2017-10-12 DIAGNOSIS — M6281 Muscle weakness (generalized): Secondary | ICD-10-CM | POA: Diagnosis not present

## 2017-10-12 DIAGNOSIS — R2689 Other abnormalities of gait and mobility: Secondary | ICD-10-CM | POA: Diagnosis not present

## 2017-10-12 NOTE — Therapy (Signed)
[facility] [address], [place], [address] Phone: [phone]   Fax:  [phone]  Physical Therapy Treatment  Patient Details  Name: [name] MRN: [id number] Date of Birth: 1945-08-07 Referring Provider: Shary Decamp, [place]   Encounter Date: 10/12/2017  PT End of Session - 10/12/17 1305    Visit Number  61    Number of Visits  109    Date for PT Re-Evaluation  10/28/17    Authorization Type  3    Authorization Time Period  of 10 progress report    PT Start Time  1305    PT Stop Time  1338    PT Time Calculation (min)  33 min    Activity Tolerance  Patient tolerated treatment well    Behavior During Therapy  [facility] for tasks assessed/performed       Past Medical History:  Diagnosis Date  . Arthritis   . Depression     Past Surgical History:  Procedure Laterality Date  . ABDOMINAL HYSTERECTOMY  1995  . back sugery    . BUNIONECTOMY  2013   rt foot  . COLONOSCOPY    . MUSCLE BIOPSY Left 10/03/2012   Procedure: LEFT QUADRICEP MUSCLE BIOPSY;  Surgeon: Odis Hollingshead, MD;  Location: [place];  Service: General;  Laterality: Left;  . NECK SURGERY  2010   cerv disc fused     There were no vitals filed for this visit.  Subjective Assessment - 10/12/17 1307    Subjective  Knees are giving her a fit today. Does not know if the weather plays a factor. Notices that she is more flexible when she is in the water.  Not really having pain, just weak feeling.   The knee burning sensation both knees started back up again yesterday.     Pertinent History  LE weakness.  Symptoms occured suddenly, unknown method of injury prior to her first neck fusion surgery on January 2010. Had lower back surgery fusion in 2011.  The neck and back surgeries did not help. Pt states having increased urinary urgency and takes medication for for it. MD aware.  Denies saddle anesthesia.  Pt states that her  doctor told her that PT is the only thing that is going to help her keep moving so she continues to participate in PT.  Last round of PT was last year which helped.  Currently has difficulty walking, performing chores (wash dishes, laundry), cooking. Better able to do her tasks a little bit when she does therapy but gets harder when she stops.  Feels burning and stinging in both her knees, and bilateral anterior and lateral legs.  Pt states not having back or neck pain. Just a stiff neck.  Pt states she usually walks with a cane on her R side. No falls within the last 6 months.      Currently in Pain?  No/denies                               PT Education - 10/12/17 1314    Education provided  Yes    Education Details  ther-ex    [employer]) Educated  Patient    Methods  Explanation;Demonstration;Tactile cues;Verbal cues    Comprehension  Returned demonstration;Verbalized understanding         Objectives  Pt not wearing R AFO.    MedBridge  Access Code: [facility]    Therapeutic exercise   Standing low rows resisting red band 10x3 with 5 second holds to promote trunk muscle strengthening   Standing regular rows resisting red band 10x5 seconds for 3 sets to promote thoracic extension   Seated trunk flexion (physioball rolls forward) 10x5 seconds   Then to the L 10x5 seconds  Difficulty with seated R hip flexion afterwards  Seated assisted R hip flexion with PT 8x 3 second holds, then 5x5 second holds for 2 sets   Standing hip abduction with bilateral UE assist 10x for R LE, 10x for L LE   Standing glute max squeeze with bilateral UE assist 10x10 seconds   Session ended early secondary to pt being tired today  Improved exercise technique, movement at target joints, use of target muscles after min to mod verbal, visual, tactile cues.     Increased difficulty with L hip flexion today compared to last land appointment in which not being able to  attend aqua therapy may have played a factor. Continued working on trunk and hip strengthening to promote ability to advance R LE during swing phase of gait. Pt states not feeling as stiff after session. Pt will benefit from continued skilled physical therapy services to improve/maintain R hip flexion strength and improve ability to ambulate.         PT Long Term Goals - 09/02/17 1350      PT LONG TERM GOAL #1   Title  Pt will be independent with her HEP to promote LE strength in function.    Time  8    Period  Weeks    Status  On-going    Target Date  10/28/17      PT LONG TERM GOAL #2   Title  Patient will improve bilateral LE strength by at least 1/2 MMT grade to promote ability to ambulate and perform functional tasks.     Time  8    Period  Weeks    Status  Achieved      PT LONG TERM GOAL #3   Title  Patient will improve her LEFS score by at least 9 points as a demonstration of improved function.     Baseline  14/80 (07/29/2016); 18/80 (10/29/2016); 31/80 (12/09/2016); 18/80, pt however states increased bilateral knee pain recently (01/14/2017); 20/80 (04/12/17); 20/80 (05/20/2017); 26/80 (07/15/2017)    Time  8    Period  Weeks    Status  Achieved      PT LONG TERM GOAL #4   Title  Pt will report being able to walk/stand with SPC over 35 min to promote mobility.     Baseline  Pt states being able to walk for about 30 min (07/29/2016); about 1 hour per pt reports (09/07/2016)    Time  8    Period  Weeks    Status  Achieved      PT LONG TERM GOAL #5   Title  Patient will have a decrease in bilateral LE pain to 5/10 or less at worst to promote ability to perform functional tasks.     Baseline  8/10 at worst (07/29/2016); Minimal burning sensation bilateral knee and legs, no pain level number provided. Pt also states feeling pain in her knees during the weekend. No pain level provided (09/07/2016); 3/10 L leg burning sensation at most for the past 7 days (09/28/2016); bilateral knee burning  sensation 8/10 at worst (10/29/2016); 7-8/10 bilateral knee pain, burning comes and goes (12/09/2016);  7-8/10 R knee, 5/10 L knee pain at most for the past 7 days (01/14/2017); 6-7/10 burning in L lower leg (04/12/17); 1-2/10 (05/18/2017); 5/10  L knee and leg burning at most , 0/10 R knee and leg burning at most for the past 7 days (07/15/2017); No pain, just the burning mainly at the knees (09/02/2017)    Time  8    Period  Weeks    Status  Achieved      PT LONG TERM GOAL #6   Title  Pt will improve seated R hip flexion strength to at least 3+/5 to promote ability to ambulate and perform standing tasks.     Baseline  2+/5 seated R hip flexion (09/28/2016), (10/29/2016); (12/09/2016), (01/14/2017), (04/12/17); 2+/5 (05/20/2017), (07/15/2017), (09/02/2017)    Time  8    Period  Weeks    Status  On-going    Target Date  10/28/17      PT LONG TERM GOAL #7   Title  Pt will improve her LEFS score to 40/80 or more as a demonstration of improved function.     Baseline  31/80 (12/09/2016); 18/80, pt however states increased bilateral knee pain recently (01/14/2017); 20/80 (04/12/17), (05/20/2017); 26/80 (07/15/2016); 35/80 (09/02/2017)    Time  8    Period  Weeks    Status  On-going    Target Date  10/28/17            Plan - 10/12/17 1315    Clinical Impression Statement   Increased difficulty with L hip flexion today compared to last land appointment in which not being able to attend aqua therapy may have played a factor. Continued working on trunk and hip strengthening to promote ability to advance R LE during swing phase of gait. Pt states not feeling as stiff after session. Pt will benefit from continued skilled physical therapy services to improve/maintain R hip flexion strength and improve ability to ambulate.     Rehab Potential  Fair    Clinical Impairments Affecting Rehab Potential  Chronicity of condition, weakness    PT Frequency  2x / week    PT Duration  8 weeks    PT Treatment/Interventions   Electrical Stimulation;Aquatic Therapy;Ultrasound;Gait training;Functional mobility training;Therapeutic activities;Therapeutic exercise;Balance training;Neuromuscular re-education;Patient/family education;Manual techniques;Dry needling;Iontophoresis 4mg /ml Dexamethasone    PT Next Visit Plan  aquatherapy, LE strengthening, modalities PRN    Consulted and Agree with Plan of Care  Patient       Patient will benefit from skilled therapeutic intervention in order to improve the following deficits and impairments:  Pain, Abnormal gait, Decreased balance, Decreased range of motion, Decreased strength, Difficulty walking  Visit Diagnosis: Difficulty in walking, not elsewhere classified  Muscle weakness (generalized)     Problem List Patient Active Problem List   Diagnosis Date Noted  . Weakness 11/03/2012   [name] PT, DPT   10/12/2017, 8:30 PM  [facility] [address] [place], [address] Phone: [phone]   Fax:  [phone]  Name: JAYLEY HUSTEAD MRN: [id number] Date of Birth: 05/06/1945

## 2017-10-14 ENCOUNTER — Ambulatory Visit: Payer: Medicare Other

## 2017-10-14 DIAGNOSIS — M6281 Muscle weakness (generalized): Secondary | ICD-10-CM

## 2017-10-14 DIAGNOSIS — M5386 Other specified dorsopathies, lumbar region: Secondary | ICD-10-CM | POA: Diagnosis not present

## 2017-10-14 DIAGNOSIS — R2689 Other abnormalities of gait and mobility: Secondary | ICD-10-CM | POA: Diagnosis not present

## 2017-10-14 DIAGNOSIS — R2681 Unsteadiness on feet: Secondary | ICD-10-CM | POA: Diagnosis not present

## 2017-10-14 DIAGNOSIS — R269 Unspecified abnormalities of gait and mobility: Secondary | ICD-10-CM | POA: Diagnosis not present

## 2017-10-14 DIAGNOSIS — R262 Difficulty in walking, not elsewhere classified: Secondary | ICD-10-CM | POA: Diagnosis not present

## 2017-10-14 NOTE — Therapy (Signed)
[facility] [address], [place], [address] Phone: [phone]   Fax:  [phone]  Physical Therapy Treatment  Patient Details  Name: [name] MRN: [id number] Date of Birth: 10/09/1945 Referring Provider: Shary Decamp, [place]   Encounter Date: 10/14/2017  PT End of Session - 10/14/17 1448    Visit Number  5    Number of Visits  109    Date for PT Re-Evaluation  10/28/17    Authorization Type  4    Authorization Time Period  of 10 progress report    PT Start Time  1448 pt arrived late    PT Stop Time  1518    PT Time Calculation (min)  30 min    Activity Tolerance  Patient tolerated treatment well    Behavior During Therapy  [facility] for tasks assessed/performed       Past Medical History:  Diagnosis Date  . Arthritis   . Depression     Past Surgical History:  Procedure Laterality Date  . ABDOMINAL HYSTERECTOMY  1995  . back sugery    . BUNIONECTOMY  2013   rt foot  . COLONOSCOPY    . MUSCLE BIOPSY Left 10/03/2012   Procedure: LEFT QUADRICEP MUSCLE BIOPSY;  Surgeon: Odis Hollingshead, MD;  Location: [facility];  Service: General;  Laterality: Left;  . NECK SURGERY  2010   cerv disc fused     There were no vitals filed for this visit.  Subjective Assessment - 10/14/17 1450    Subjective  The knees are better than it was. Sometimes thinks it is the heat that gets her. Can pick up her R thigh a little better today. 5/10 R knee and 3/10 L knee burning sensation.  Has not been doing her HEP due to being tired of doing exercises.     Pertinent History  LE weakness.  Symptoms occured suddenly, unknown method of injury prior to her first neck fusion surgery on January 2010. Had lower back surgery fusion in 2011.  The neck and back surgeries did not help. Pt states having increased urinary urgency and takes medication for for it. MD aware.  Denies saddle anesthesia.  Pt states that her  doctor told her that PT is the only thing that is going to help her keep moving so she continues to participate in PT.  Last round of PT was last year which helped.  Currently has difficulty walking, performing chores (wash dishes, laundry), cooking. Better able to do her tasks a little bit when she does therapy but gets harder when she stops.  Feels burning and stinging in both her knees, and bilateral anterior and lateral legs.  Pt states not having back or neck pain. Just a stiff neck.  Pt states she usually walks with a cane on her R side. No falls within the last 6 months.      Currently in Pain?  Yes    Pain Score  5  5/10 R knee, 3/10 L knee    Pain Location  Knee    Pain Descriptors / Indicators  Burning                               PT Education - 10/14/17 1510    Education provided  Yes    Education Details  ther-ex    [employer]) Educated  Patient  Methods  Explanation;Demonstration;Tactile cues;Verbal cues    Comprehension  Returned demonstration;Verbalized understanding         Objectives  Pt not wearing R AFO.    MedBridge Access Code: [email or web address]   Manual therapy  Seated STM to R quadratus lumborum to decrease tension Slight improved R hip flexion AROM   Therapeutic exercise    Standing low rows resisting red band 10x3 with 5 second holds to promote trunk muscle strengthening   Sitting with upright posture and abdominal muscle contraction  Manual perturbation from PT 1 min x 3    Improved exercise technique, movement at target joints, use of target muscles after min to mod verbal, visual, tactile cues.   Worked on decreasing R quadratus lumborum muscle tension today in sitting which helped improve R hip flexion slightly. Continued working on trunk muscle strenghtening to promote good foundation for her hip flexor muscles to activate. Pt still demonstrates R hip weakness and would benefit from continued skilled physical therapy  services to promote strength and ability to ambulate. Pt demonstrates tendency to maintain progress when participating in PT compared to not based on subjective reports.         PT Long Term Goals - 09/02/17 1350      PT LONG TERM GOAL #1   Title  Pt will be independent with her HEP to promote LE strength in function.    Time  8    Period  Weeks    Status  On-going    Target Date  10/28/17      PT LONG TERM GOAL #2   Title  Patient will improve bilateral LE strength by at least 1/2 MMT grade to promote ability to ambulate and perform functional tasks.     Time  8    Period  Weeks    Status  Achieved      PT LONG TERM GOAL #3   Title  Patient will improve her LEFS score by at least 9 points as a demonstration of improved function.     Baseline  14/80 (07/29/2016); 18/80 (10/29/2016); 31/80 (12/09/2016); 18/80, pt however states increased bilateral knee pain recently (01/14/2017); 20/80 (04/12/17); 20/80 (05/20/2017); 26/80 (07/15/2017)    Time  8    Period  Weeks    Status  Achieved      PT LONG TERM GOAL #4   Title  Pt will report being able to walk/stand with SPC over 35 min to promote mobility.     Baseline  Pt states being able to walk for about 30 min (07/29/2016); about 1 hour per pt reports (09/07/2016)    Time  8    Period  Weeks    Status  Achieved      PT LONG TERM GOAL #5   Title  Patient will have a decrease in bilateral LE pain to 5/10 or less at worst to promote ability to perform functional tasks.     Baseline  8/10 at worst (07/29/2016); Minimal burning sensation bilateral knee and legs, no pain level number provided. Pt also states feeling pain in her knees during the weekend. No pain level provided (09/07/2016); 3/10 L leg burning sensation at most for the past 7 days (09/28/2016); bilateral knee burning sensation 8/10 at worst (10/29/2016); 7-8/10 bilateral knee pain, burning comes and goes (12/09/2016); 7-8/10 R knee, 5/10 L knee pain at most for the past 7 days (01/14/2017);  6-7/10 burning in L lower leg (04/12/17); 1-2/10 (05/18/2017); 5/10  L knee and leg  burning at most , 0/10 R knee and leg burning at most for the past 7 days (07/15/2017); No pain, just the burning mainly at the knees (09/02/2017)    Time  8    Period  Weeks    Status  Achieved      PT LONG TERM GOAL #6   Title  Pt will improve seated R hip flexion strength to at least 3+/5 to promote ability to ambulate and perform standing tasks.     Baseline  2+/5 seated R hip flexion (09/28/2016), (10/29/2016); (12/09/2016), (01/14/2017), (04/12/17); 2+/5 (05/20/2017), (07/15/2017), (09/02/2017)    Time  8    Period  Weeks    Status  On-going    Target Date  10/28/17      PT LONG TERM GOAL #7   Title  Pt will improve her LEFS score to 40/80 or more as a demonstration of improved function.     Baseline  31/80 (12/09/2016); 18/80, pt however states increased bilateral knee pain recently (01/14/2017); 20/80 (04/12/17), (05/20/2017); 26/80 (07/15/2016); 35/80 (09/02/2017)    Time  8    Period  Weeks    Status  On-going    Target Date  10/28/17            Plan - 10/14/17 2232    Clinical Impression Statement  Worked on decreasing R quadratus lumborum muscle tension today in sitting which helped improve R hip flexion slightly. Continued working on trunk muscle strenghtening to promote good foundation for her hip flexor muscles to activate. Pt still demonstrates R hip weakness and would benefit from continued skilled physical therapy services to promote strength and ability to ambulate. Pt demonstrates tendency to maintain progress when participating in PT compared to not based on subjective reports.    Rehab Potential  Fair    Clinical Impairments Affecting Rehab Potential  Chronicity of condition, weakness    PT Frequency  2x / week    PT Duration  8 weeks    PT Treatment/Interventions  Electrical Stimulation;Aquatic Therapy;Ultrasound;Gait training;Functional mobility training;Therapeutic activities;Therapeutic  exercise;Balance training;Neuromuscular re-education;Patient/family education;Manual techniques;Dry needling;Iontophoresis 4mg /ml Dexamethasone    PT Next Visit Plan  aquatherapy, LE strengthening, modalities PRN    Consulted and Agree with Plan of Care  Patient       Patient will benefit from skilled therapeutic intervention in order to improve the following deficits and impairments:  Pain, Abnormal gait, Decreased balance, Decreased range of motion, Decreased strength, Difficulty walking  Visit Diagnosis: Difficulty in walking, not elsewhere classified  Muscle weakness (generalized)     Problem List Patient Active Problem List   Diagnosis Date Noted  . Weakness 11/03/2012    [name] PT, DPT   10/14/2017, 10:41 PM  [facility] [address] [place], [address] Phone: [phone]   Fax:  [phone]  Name: [name] MRN: [id number] Date of Birth: [birth year]

## 2017-10-19 ENCOUNTER — Other Ambulatory Visit: Payer: Self-pay

## 2017-10-19 ENCOUNTER — Ambulatory Visit: Payer: Medicare Other

## 2017-10-19 DIAGNOSIS — M545 Low back pain, unspecified: Secondary | ICD-10-CM

## 2017-10-19 DIAGNOSIS — M5386 Other specified dorsopathies, lumbar region: Secondary | ICD-10-CM | POA: Diagnosis not present

## 2017-10-19 DIAGNOSIS — M79604 Pain in right leg: Secondary | ICD-10-CM

## 2017-10-19 DIAGNOSIS — R2681 Unsteadiness on feet: Secondary | ICD-10-CM | POA: Diagnosis not present

## 2017-10-19 DIAGNOSIS — R262 Difficulty in walking, not elsewhere classified: Secondary | ICD-10-CM | POA: Diagnosis not present

## 2017-10-19 DIAGNOSIS — M79605 Pain in left leg: Secondary | ICD-10-CM

## 2017-10-19 DIAGNOSIS — R2689 Other abnormalities of gait and mobility: Secondary | ICD-10-CM | POA: Diagnosis not present

## 2017-10-19 DIAGNOSIS — M25562 Pain in left knee: Secondary | ICD-10-CM

## 2017-10-19 DIAGNOSIS — R269 Unspecified abnormalities of gait and mobility: Secondary | ICD-10-CM

## 2017-10-19 DIAGNOSIS — M6281 Muscle weakness (generalized): Secondary | ICD-10-CM | POA: Diagnosis not present

## 2017-10-19 DIAGNOSIS — M2560 Stiffness of unspecified joint, not elsewhere classified: Secondary | ICD-10-CM

## 2017-10-19 DIAGNOSIS — M25561 Pain in right knee: Secondary | ICD-10-CM

## 2017-10-19 DIAGNOSIS — R29898 Other symptoms and signs involving the musculoskeletal system: Secondary | ICD-10-CM

## 2017-10-19 NOTE — Therapy (Signed)
[place] MAIN [facility] SERVICES [address] [place], [place], [address] Phone: [phone]   Fax:  [phone]  Physical Therapy Treatment  Patient Details  Name: [name] MRN: [id number] Date of Birth: September 09, 1945 Referring Provider: Shary Decamp, [place]   Encounter Date: 10/19/2017  PT End of Session - 10/19/17 1626    Visit Number  48    Number of Visits  109    Date for PT Re-Evaluation  10/28/17    PT Start Time  0900    PT Stop Time  1000    PT Time Calculation (min)  60 min    Activity Tolerance  Patient tolerated treatment well    Behavior During Therapy  [facility] for tasks assessed/performed       Past Medical History:  Diagnosis Date  . Arthritis   . Depression     Past Surgical History:  Procedure Laterality Date  . ABDOMINAL HYSTERECTOMY  1995  . back sugery    . BUNIONECTOMY  2013   rt foot  . COLONOSCOPY    . MUSCLE BIOPSY Left 10/03/2012   Procedure: LEFT QUADRICEP MUSCLE BIOPSY;  Surgeon: Odis Hollingshead, MD;  Location: [facility];  Service: General;  Laterality: Left;  . NECK SURGERY  2010   cerv disc fused     There were no vitals filed for this visit.  Subjective Assessment - 10/19/17 1625    Subjective  Pt reports burning for knees to feet bilaterally today    Pertinent History  LE weakness.  Symptoms occured suddenly, unknown method of injury prior to her first neck fusion surgery on January 2010. Had lower back surgery fusion in 2011.  The neck and back surgeries did not help. Pt states having increased urinary urgency and takes medication for for it. MD aware.  Denies saddle anesthesia.  Pt states that her doctor told her that PT is the only thing that is going to help her keep moving so she continues to participate in PT.  Last round of PT was last year which helped.  Currently has difficulty walking, performing chores (wash dishes, laundry), cooking. Better able to do her tasks a little bit  when she does therapy but gets harder when she stops.  Feels burning and stinging in both her knees, and bilateral anterior and lateral legs.  Pt states not having back or neck pain. Just a stiff neck.  Pt states she usually walks with a cane on her R side. No falls within the last 6 months.        Ambulation, blue dumbbells  Fwd, 4 L   Side 4 L  Side with squat 4 L  Core with LE strength  Side kicks, B 25x  Fwd/bkwd kicks, B 25x  Squats  Mod plank with  BLE ext, 2 x 10 ea  BLE abd, 2 x 10 ea  Seated bench, 5 min ea  Bike  Scissor  Flutter  STS, no UE support 25x  Core/balance  Stand knee abdominal tucks, 2 x 10 ea  Stand knee abdominal tucks with opp UE D1 pattern  Active stretch  B quad/hip flexor  B hamstrings  Independent walking post session x 15 min                                PT Long Term Goals - 09/02/17 1350      PT  LONG TERM GOAL #1   Title  Pt will be independent with her HEP to promote LE strength in function.    Time  8    Period  Weeks    Status  On-going    Target Date  10/28/17      PT LONG TERM GOAL #2   Title  Patient will improve bilateral LE strength by at least 1/2 MMT grade to promote ability to ambulate and perform functional tasks.     Time  8    Period  Weeks    Status  Achieved      PT LONG TERM GOAL #3   Title  Patient will improve her LEFS score by at least 9 points as a demonstration of improved function.     Baseline  14/80 (07/29/2016); 18/80 (10/29/2016); 31/80 (12/09/2016); 18/80, pt however states increased bilateral knee pain recently (01/14/2017); 20/80 (04/12/17); 20/80 (05/20/2017); 26/80 (07/15/2017)    Time  8    Period  Weeks    Status  Achieved      PT LONG TERM GOAL #4   Title  Pt will report being able to walk/stand with SPC over 35 min to promote mobility.     Baseline  Pt states being able to walk for about 30 min (07/29/2016); about 1 hour per pt reports (09/07/2016)    Time  8    Period   Weeks    Status  Achieved      PT LONG TERM GOAL #5   Title  Patient will have a decrease in bilateral LE pain to 5/10 or less at worst to promote ability to perform functional tasks.     Baseline  8/10 at worst (07/29/2016); Minimal burning sensation bilateral knee and legs, no pain level number provided. Pt also states feeling pain in her knees during the weekend. No pain level provided (09/07/2016); 3/10 L leg burning sensation at most for the past 7 days (09/28/2016); bilateral knee burning sensation 8/10 at worst (10/29/2016); 7-8/10 bilateral knee pain, burning comes and goes (12/09/2016); 7-8/10 R knee, 5/10 L knee pain at most for the past 7 days (01/14/2017); 6-7/10 burning in L lower leg (04/12/17); 1-2/10 (05/18/2017); 5/10  L knee and leg burning at most , 0/10 R knee and leg burning at most for the past 7 days (07/15/2017); No pain, just the burning mainly at the knees (09/02/2017)    Time  8    Period  Weeks    Status  Achieved      PT LONG TERM GOAL #6   Title  Pt will improve seated R hip flexion strength to at least 3+/5 to promote ability to ambulate and perform standing tasks.     Baseline  2+/5 seated R hip flexion (09/28/2016), (10/29/2016); (12/09/2016), (01/14/2017), (04/12/17); 2+/5 (05/20/2017), (07/15/2017), (09/02/2017)    Time  8    Period  Weeks    Status  On-going    Target Date  10/28/17      PT LONG TERM GOAL #7   Title  Pt will improve her LEFS score to 40/80 or more as a demonstration of improved function.     Baseline  31/80 (12/09/2016); 18/80, pt however states increased bilateral knee pain recently (01/14/2017); 20/80 (04/12/17), (05/20/2017); 26/80 (07/15/2016); 35/80 (09/02/2017)    Time  8    Period  Weeks    Status  On-going    Target Date  10/28/17  Plan - 10/19/17 1626    Clinical Impression Statement  Pt worked well in water today and notes feeling that water allows her to do the most work/exercise. Agreed. Pt demonstrates improved ability to maintain  knee ext with kicking exercises for hip and quad strengthening. Cues for improved DF with ambulation for foot clearance, which pt able to demonstrate with attention to it. Requires red education and verbal/tactile cues with plank work.     Rehab Potential  Fair    Clinical Impairments Affecting Rehab Potential  Chronicity of condition, weakness    PT Frequency  2x / week    PT Duration  8 weeks    PT Treatment/Interventions  Electrical Stimulation;Aquatic Therapy;Ultrasound;Gait training;Functional mobility training;Therapeutic activities;Therapeutic exercise;Balance training;Neuromuscular re-education;Patient/family education;Manual techniques;Dry needling;Iontophoresis 4mg /ml Dexamethasone    PT Next Visit Plan  aquatherapy, LE strengthening, modalities PRN    Consulted and Agree with Plan of Care  Patient       Patient will benefit from skilled therapeutic intervention in order to improve the following deficits and impairments:  Pain, Abnormal gait, Decreased balance, Decreased range of motion, Decreased strength, Difficulty walking  Visit Diagnosis: Difficulty in walking, not elsewhere classified  Muscle weakness (generalized)  Unsteadiness on feet  Other abnormalities of gait and mobility  Unsteadiness  Decreased ROM of lumbar spine  Abnormality of gait  Weakness of both legs  Joint stiffness of spine  Right knee pain, unspecified chronicity  Left knee pain, unspecified chronicity  Pain in left leg  Pain in right leg  Bilateral low back pain without sciatica, unspecified chronicity     Problem List Patient Active Problem List   Diagnosis Date Noted  . Weakness 11/03/2012    Larae Grooms 10/19/2017, 4:31 PM  [place] MAIN [facility] SERVICES [address] [place], [place], [address] Phone: [phone]   Fax:  [phone]  Name: SHAUNIECE KWAN MRN: [id number] Date of Birth: 12-25-1945

## 2017-10-20 ENCOUNTER — Ambulatory Visit (INDEPENDENT_AMBULATORY_CARE_PROVIDER_SITE_OTHER): Payer: Self-pay

## 2017-10-20 ENCOUNTER — Ambulatory Visit (INDEPENDENT_AMBULATORY_CARE_PROVIDER_SITE_OTHER): Payer: Medicare Other | Admitting: Orthopaedic Surgery

## 2017-10-20 ENCOUNTER — Ambulatory Visit (INDEPENDENT_AMBULATORY_CARE_PROVIDER_SITE_OTHER): Payer: Medicare Other

## 2017-10-20 ENCOUNTER — Encounter (INDEPENDENT_AMBULATORY_CARE_PROVIDER_SITE_OTHER): Payer: Self-pay | Admitting: Orthopaedic Surgery

## 2017-10-20 DIAGNOSIS — M25562 Pain in left knee: Secondary | ICD-10-CM

## 2017-10-20 DIAGNOSIS — G8929 Other chronic pain: Secondary | ICD-10-CM

## 2017-10-20 DIAGNOSIS — M25561 Pain in right knee: Secondary | ICD-10-CM

## 2017-10-20 MED ORDER — METHYLPREDNISOLONE ACETATE 40 MG/ML IJ SUSP
40.0000 mg | INTRAMUSCULAR | Status: AC | PRN
  Administered 2017-10-20: 40 mg via INTRA_ARTICULAR

## 2017-10-20 MED ORDER — LIDOCAINE HCL 1 % IJ SOLN
3.0000 mL | INTRAMUSCULAR | Status: AC | PRN
Start: 2017-10-20 — End: 2017-10-20
  Administered 2017-10-20: 3 mL

## 2017-10-20 MED ORDER — LIDOCAINE HCL 1 % IJ SOLN
3.0000 mL | INTRAMUSCULAR | Status: AC | PRN
  Administered 2017-10-20: 3 mL

## 2017-10-20 NOTE — Progress Notes (Signed)
Office Visit Note   Patient: [name]           Date of Birth: [birth year]           MRN: [id number] Visit Date: 10/20/2017              Requested by: Merrilee Seashore, [address] [place], [place] [address] PCP: Merrilee Seashore, MD   Assessment & Plan: Visit Diagnoses:  1. Chronic pain of left knee   2. Chronic pain of right knee     Plan: She is a perfect candidate for trying steroid injections in her knees today and then hyaluronic acid in both knees in a month from now given her rheumatoid disease.  I do feel that this would help her knees.  She is going to continue to work on quad strengthening exercises as well.  She is to try Tumeric and a knee sleeve as well.  All questions concerns were answered and addressed.  We will see her back in 4 weeks to hopefully place hyaluronic acid in both knees.  Follow-Up Instructions: Return in about 1 month (around 11/17/2017).   Orders:  Orders Placed This Encounter  Procedures  . Large Joint Inj  . Large Joint Inj  . XR Knee 1-2 Views Left  . XR Knee 1-2 Views Right   No orders of the defined types were placed in this encounter.     Procedures: Large Joint Inj: R knee on 10/20/2017 2:00 PM Indications: diagnostic evaluation and pain Details: 22 G 1.5 in needle, superolateral approach  Arthrogram: No  Medications: 3 mL lidocaine 1 %; 40 mg methylPREDNISolone acetate 40 MG/ML Outcome: tolerated well, no immediate complications Procedure, treatment alternatives, risks and benefits explained, specific risks discussed. Consent was given by the patient. Immediately prior to procedure a time out was called to verify the correct patient, procedure, equipment, support staff and site/side marked as required. Patient was prepped and draped in the usual sterile fashion.   Large Joint Inj: L knee on 10/20/2017 2:00 PM Indications: diagnostic evaluation and pain Details: 22 G 1.5 in needle, superolateral  approach  Arthrogram: No  Medications: 3 mL lidocaine 1 %; 40 mg methylPREDNISolone acetate 40 MG/ML Outcome: tolerated well, no immediate complications Procedure, treatment alternatives, risks and benefits explained, specific risks discussed. Consent was given by the patient. Immediately prior to procedure a time out was called to verify the correct patient, procedure, equipment, support staff and site/side marked as required. Patient was prepped and draped in the usual sterile fashion.       Clinical Data: No additional findings.   Subjective: Chief Complaint  Patient presents with  . Left Knee - Pain  . Right Knee - Pain  The patient is coming in today for evaluation and treatment of bilateral knee pain with the right worse than left.  She does ambulate using a cane.  This is slowly gotten worse for her for several years now.  Really the last year is been the worst.  She says there is a constant ache and is worse with activities.  If she is been sitting for long period time and gets up to move the knees hurt.  She says is all started when she was really getting something out of her bed about a year ago.  She had previous neck and back surgery.  She does see a rheumatologist.  She is not diabetic and does not smoke.  She is tries to be active at  72 years old but is frustrated she cannot be as active as she used to be.  HPI  Review of Systems She currently denies any headache, chest pain, shortness of breath, fever, chills, nausea, vomiting.  Objective: Vital Signs: There were no vitals taken for this visit.  Physical Exam She is alert and oriented x3 and in no acute distress Ortho Exam Examination of both knees show slight valgus malalignment of the right knee and more of a neutral alignment on the left knee.  Both knees have some patellofemoral crepitation mild effusion bilaterally.  Both knees are ligamentously stable with good range of motion and global tenderness. Specialty  Comments:  No specialty comments available.  Imaging: Xr Knee 1-2 Views Left  Result Date: 10/20/2017 2 views of the left knee show no acute findings.  There is moderate arthritic changes throughout the knee.  Xr Knee 1-2 Views Right  Result Date: 10/20/2017 An AP and lateral view of the right knee show no acute findings.  There is moderate tricompartmental arthritic changes.     PMFS History: Patient Active Problem List   Diagnosis Date Noted  . Weakness 11/03/2012   Past Medical History:  Diagnosis Date  . Arthritis   . Depression     Family History  Problem Relation Age of Onset  . Hypertension Mother   . Cancer Sister     Past Surgical History:  Procedure Laterality Date  . ABDOMINAL HYSTERECTOMY  1995  . back sugery    . BUNIONECTOMY  2013   rt foot  . COLONOSCOPY    . MUSCLE BIOPSY Left 10/03/2012   Procedure: LEFT QUADRICEP MUSCLE BIOPSY;  Surgeon: Odis Hollingshead, MD;  Location: [facility];  Service: General;  Laterality: Left;  . NECK SURGERY  2010   cerv disc fused    Social History   Occupational History  . Not on file  Tobacco Use  . Smoking status: Former Smoker    Last attempt to quit: 03/23/1998    Years since quitting: 19.5  . Smokeless tobacco: Never Used  Substance and Sexual Activity  . Alcohol use: No  . Drug use: No  . Sexual activity: Not on file

## 2017-10-21 ENCOUNTER — Ambulatory Visit: Payer: Medicare Other | Attending: Physician Assistant

## 2017-10-21 ENCOUNTER — Other Ambulatory Visit: Payer: Self-pay

## 2017-10-21 DIAGNOSIS — M2560 Stiffness of unspecified joint, not elsewhere classified: Secondary | ICD-10-CM | POA: Diagnosis not present

## 2017-10-21 DIAGNOSIS — R2689 Other abnormalities of gait and mobility: Secondary | ICD-10-CM | POA: Insufficient documentation

## 2017-10-21 DIAGNOSIS — M79605 Pain in left leg: Secondary | ICD-10-CM | POA: Diagnosis not present

## 2017-10-21 DIAGNOSIS — M545 Low back pain, unspecified: Secondary | ICD-10-CM

## 2017-10-21 DIAGNOSIS — M25561 Pain in right knee: Secondary | ICD-10-CM | POA: Insufficient documentation

## 2017-10-21 DIAGNOSIS — R29898 Other symptoms and signs involving the musculoskeletal system: Secondary | ICD-10-CM | POA: Diagnosis not present

## 2017-10-21 DIAGNOSIS — M79604 Pain in right leg: Secondary | ICD-10-CM | POA: Insufficient documentation

## 2017-10-21 DIAGNOSIS — R269 Unspecified abnormalities of gait and mobility: Secondary | ICD-10-CM | POA: Insufficient documentation

## 2017-10-21 DIAGNOSIS — R262 Difficulty in walking, not elsewhere classified: Secondary | ICD-10-CM | POA: Diagnosis not present

## 2017-10-21 DIAGNOSIS — R2681 Unsteadiness on feet: Secondary | ICD-10-CM | POA: Diagnosis not present

## 2017-10-21 DIAGNOSIS — M5386 Other specified dorsopathies, lumbar region: Secondary | ICD-10-CM | POA: Insufficient documentation

## 2017-10-21 DIAGNOSIS — M25562 Pain in left knee: Secondary | ICD-10-CM

## 2017-10-21 DIAGNOSIS — M6281 Muscle weakness (generalized): Secondary | ICD-10-CM | POA: Diagnosis not present

## 2017-10-21 NOTE — Therapy (Signed)
[place] MAIN [facility] SERVICES [address] [place], [place], [address] Phone: [phone]   Fax:  [phone]  Physical Therapy Treatment  Patient Details  Name: [name] MRN: [id number] Date of Birth: July 13, 1945 Referring Provider: Shary Decamp, [place]   Encounter Date: 10/21/2017  PT End of Session - 10/21/17 1732    Visit Number  33    Number of Visits  109    Date for PT Re-Evaluation  10/28/17    PT Start Time  4196    PT Stop Time  1415    PT Time Calculation (min)  40 min    Activity Tolerance  Patient tolerated treatment well    Behavior During Therapy  [facility] for tasks assessed/performed       Past Medical History:  Diagnosis Date  . Arthritis   . Depression     Past Surgical History:  Procedure Laterality Date  . ABDOMINAL HYSTERECTOMY  1995  . back sugery    . BUNIONECTOMY  2013   rt foot  . COLONOSCOPY    . MUSCLE BIOPSY Left 10/03/2012   Procedure: LEFT QUADRICEP MUSCLE BIOPSY;  Surgeon: Odis Hollingshead, MD;  Location: [facility];  Service: General;  Laterality: Left;  . NECK SURGERY  2010   cerv disc fused     There were no vitals filed for this visit.  Subjective Assessment - 10/21/17 1731    Subjective  Decreased burning BLE this session, but mildly present. Pt notes finding R DF assist brace and beginning to wear again.     Pertinent History  LE weakness.  Symptoms occured suddenly, unknown method of injury prior to her first neck fusion surgery on January 2010. Had lower back surgery fusion in 2011.  The neck and back surgeries did not help. Pt states having increased urinary urgency and takes medication for for it. MD aware.  Denies saddle anesthesia.  Pt states that her doctor told her that PT is the only thing that is going to help her keep moving so she continues to participate in PT.  Last round of PT was last year which helped.  Currently has difficulty walking, performing chores (wash  dishes, laundry), cooking. Better able to do her tasks a little bit when she does therapy but gets harder when she stops.  Feels burning and stinging in both her knees, and bilateral anterior and lateral legs.  Pt states not having back or neck pain. Just a stiff neck.  Pt states she usually walks with a cane on her R side. No falls within the last 6 months.        Enters/exits via ramp  Ambulation  4L fwd  4 L side  4 L side with squat  LE strength, rail for light UE support  fwd lunges, B 30x ea  Side lunges, B 30x ea   Resisted squat with noodle, 30x  Bench, 5 min ea  Bike   Scissor  Flutter  Independent fwd speed ambulation post session                                PT Long Term Goals - 09/02/17 1350      PT LONG TERM GOAL #1   Title  Pt will be independent with her HEP to promote LE strength in function.    Time  8    Period  Weeks  Status  On-going    Target Date  10/28/17      PT LONG TERM GOAL #2   Title  Patient will improve bilateral LE strength by at least 1/2 MMT grade to promote ability to ambulate and perform functional tasks.     Time  8    Period  Weeks    Status  Achieved      PT LONG TERM GOAL #3   Title  Patient will improve her LEFS score by at least 9 points as a demonstration of improved function.     Baseline  14/80 (07/29/2016); 18/80 (10/29/2016); 31/80 (12/09/2016); 18/80, pt however states increased bilateral knee pain recently (01/14/2017); 20/80 (04/12/17); 20/80 (05/20/2017); 26/80 (07/15/2017)    Time  8    Period  Weeks    Status  Achieved      PT LONG TERM GOAL #4   Title  Pt will report being able to walk/stand with SPC over 35 min to promote mobility.     Baseline  Pt states being able to walk for about 30 min (07/29/2016); about 1 hour per pt reports (09/07/2016)    Time  8    Period  Weeks    Status  Achieved      PT LONG TERM GOAL #5   Title  Patient will have a decrease in bilateral LE pain to 5/10 or  less at worst to promote ability to perform functional tasks.     Baseline  8/10 at worst (07/29/2016); Minimal burning sensation bilateral knee and legs, no pain level number provided. Pt also states feeling pain in her knees during the weekend. No pain level provided (09/07/2016); 3/10 L leg burning sensation at most for the past 7 days (09/28/2016); bilateral knee burning sensation 8/10 at worst (10/29/2016); 7-8/10 bilateral knee pain, burning comes and goes (12/09/2016); 7-8/10 R knee, 5/10 L knee pain at most for the past 7 days (01/14/2017); 6-7/10 burning in L lower leg (04/12/17); 1-2/10 (05/18/2017); 5/10  L knee and leg burning at most , 0/10 R knee and leg burning at most for the past 7 days (07/15/2017); No pain, just the burning mainly at the knees (09/02/2017)    Time  8    Period  Weeks    Status  Achieved      PT LONG TERM GOAL #6   Title  Pt will improve seated R hip flexion strength to at least 3+/5 to promote ability to ambulate and perform standing tasks.     Baseline  2+/5 seated R hip flexion (09/28/2016), (10/29/2016); (12/09/2016), (01/14/2017), (04/12/17); 2+/5 (05/20/2017), (07/15/2017), (09/02/2017)    Time  8    Period  Weeks    Status  On-going    Target Date  10/28/17      PT LONG TERM GOAL #7   Title  Pt will improve her LEFS score to 40/80 or more as a demonstration of improved function.     Baseline  31/80 (12/09/2016); 18/80, pt however states increased bilateral knee pain recently (01/14/2017); 20/80 (04/12/17), (05/20/2017); 26/80 (07/15/2016); 35/80 (09/02/2017)    Time  8    Period  Weeks    Status  On-going    Target Date  10/28/17            Plan - 10/21/17 1734    Clinical Impression Statement  Pt tolerated session well. Improved LE strength with bike demonstrating increased speed and strength with scissor and flutter better able to maintain straight knee position  throughout.     Rehab Potential  Fair    Clinical Impairments Affecting Rehab Potential  Chronicity of  condition, weakness    PT Frequency  2x / week    PT Duration  8 weeks    PT Treatment/Interventions  Electrical Stimulation;Aquatic Therapy;Ultrasound;Gait training;Functional mobility training;Therapeutic activities;Therapeutic exercise;Balance training;Neuromuscular re-education;Patient/family education;Manual techniques;Dry needling;Iontophoresis 4mg /ml Dexamethasone    PT Next Visit Plan  aquatherapy, LE strengthening, modalities PRN    Consulted and Agree with Plan of Care  Patient       Patient will benefit from skilled therapeutic intervention in order to improve the following deficits and impairments:  Pain, Abnormal gait, Decreased balance, Decreased range of motion, Decreased strength, Difficulty walking  Visit Diagnosis: Difficulty in walking, not elsewhere classified  Muscle weakness (generalized)  Unsteadiness on feet  Other abnormalities of gait and mobility  Unsteadiness  Decreased ROM of lumbar spine  Abnormality of gait  Weakness of both legs  Joint stiffness of spine  Right knee pain, unspecified chronicity  Left knee pain, unspecified chronicity  Pain in left leg  Pain in right leg  Bilateral low back pain without sciatica, unspecified chronicity     Problem List Patient Active Problem List   Diagnosis Date Noted  . Weakness 11/03/2012    Larae Grooms 10/21/2017, 5:36 PM  [place] MAIN [facility] SERVICES [address] [place], [place], [address] Phone: [phone]   Fax:  [phone]  Name: [name] MRN: [id number] Date of Birth: 02-Nov-1945

## 2017-10-22 ENCOUNTER — Telehealth (INDEPENDENT_AMBULATORY_CARE_PROVIDER_SITE_OTHER): Payer: Self-pay

## 2017-10-22 NOTE — Telephone Encounter (Signed)
Bilateral knee gel injections 

## 2017-10-26 ENCOUNTER — Ambulatory Visit

## 2017-10-28 ENCOUNTER — Telehealth (INDEPENDENT_AMBULATORY_CARE_PROVIDER_SITE_OTHER): Payer: Self-pay

## 2017-10-28 ENCOUNTER — Ambulatory Visit: Payer: Medicare Other

## 2017-10-28 ENCOUNTER — Ambulatory Visit

## 2017-10-28 NOTE — Telephone Encounter (Signed)
Noted  

## 2017-10-28 NOTE — Telephone Encounter (Signed)
Submitted for VOB, Monovisc, bilateral knee.

## 2017-11-01 ENCOUNTER — Telehealth (INDEPENDENT_AMBULATORY_CARE_PROVIDER_SITE_OTHER): Payer: Self-pay

## 2017-11-01 NOTE — Telephone Encounter (Signed)
Patient approved for Monovisc, bilateral knee. Buy & Bill No co-pay No PA required Appt.scheduled 11/24/2017 with Dr. Ninfa Linden

## 2017-11-02 ENCOUNTER — Ambulatory Visit: Payer: Medicare Other | Admitting: Physical Therapy

## 2017-11-02 ENCOUNTER — Ambulatory Visit: Payer: Medicare Other

## 2017-11-02 DIAGNOSIS — R269 Unspecified abnormalities of gait and mobility: Secondary | ICD-10-CM | POA: Diagnosis not present

## 2017-11-02 DIAGNOSIS — R262 Difficulty in walking, not elsewhere classified: Secondary | ICD-10-CM | POA: Diagnosis not present

## 2017-11-02 DIAGNOSIS — M25562 Pain in left knee: Secondary | ICD-10-CM

## 2017-11-02 DIAGNOSIS — M6281 Muscle weakness (generalized): Secondary | ICD-10-CM | POA: Diagnosis not present

## 2017-11-02 DIAGNOSIS — R2689 Other abnormalities of gait and mobility: Secondary | ICD-10-CM | POA: Diagnosis not present

## 2017-11-02 DIAGNOSIS — M25561 Pain in right knee: Secondary | ICD-10-CM

## 2017-11-02 DIAGNOSIS — M79604 Pain in right leg: Secondary | ICD-10-CM

## 2017-11-02 DIAGNOSIS — M79605 Pain in left leg: Secondary | ICD-10-CM

## 2017-11-02 DIAGNOSIS — R2681 Unsteadiness on feet: Secondary | ICD-10-CM | POA: Diagnosis not present

## 2017-11-02 DIAGNOSIS — M5386 Other specified dorsopathies, lumbar region: Secondary | ICD-10-CM | POA: Diagnosis not present

## 2017-11-02 NOTE — Therapy (Signed)
[facility] [address] [place], [address] Phone: [phone]   Fax:  [phone]  Physical Therapy Treatment And Progress Report  Patient Details  Name: [name] MRN: [id number] Date of Birth: 04/29/1945 Referring Provider: Shary Decamp, [place]   Encounter Date: 11/02/2017  PT End of Session - 11/02/17 1310    Visit Number  65    Number of Visits  125    Date for PT Re-Evaluation  12/30/17    Authorization Type  7    Authorization Time Period  of 10 progress report    PT Start Time  8466   pt arrived late   PT Stop Time  1403    PT Time Calculation (min)  52 min    Activity Tolerance  Patient tolerated treatment well    Behavior During Therapy  [facility] for tasks assessed/performed       Past Medical History:  Diagnosis Date  . Arthritis   . Depression     Past Surgical History:  Procedure Laterality Date  . ABDOMINAL HYSTERECTOMY  1995  . back sugery    . BUNIONECTOMY  2013   rt foot  . COLONOSCOPY    . MUSCLE BIOPSY Left 10/03/2012   Procedure: LEFT QUADRICEP MUSCLE BIOPSY;  Surgeon: Odis Hollingshead, MD;  Location: [facility];  Service: General;  Laterality: Left;  . NECK SURGERY  2010   cerv disc fused     There were no vitals filed for this visit.  Subjective Assessment - 11/02/17 1314    Subjective  The therapy at the pool really helps. Walking and picking up her R thigh is about the same. Taking it one day at a time.  A little discomfort. Took a roadtrip to [place] for her 50th wedding anniversary.  Got back Sunday. Still a little tired from it.   Knees are not bad. Just the norm. The R side still bothers her.  0/10 R knee currently and at worst for the past 7 days. Just feels tired.  L knee, no pain, L lateral leg 6/10 currently and at worst for the past 7 days.  Went to the orthopedist and got injection for both knees which took the edge off.    Trying to live  with what she's got.    Feels like PT helps her with her mobility. If she did not do PT, especially with aquatherapy, it is more difficult to get around.     Pertinent History  LE weakness.  Symptoms occured suddenly, unknown method of injury prior to her first neck fusion surgery on January 2010. Had lower back surgery fusion in 2011.  The neck and back surgeries did not help. Pt states having increased urinary urgency and takes medication for for it. MD aware.  Denies saddle anesthesia.  Pt states that her doctor told her that PT is the only thing that is going to help her keep moving so she continues to participate in PT.  Last round of PT was last year which helped.  Currently has difficulty walking, performing chores (wash dishes, laundry), cooking. Better able to do her tasks a little bit when she does therapy but gets harder when she stops.  Feels burning and stinging in both her knees, and bilateral anterior and lateral legs.  Pt states not having back or neck pain. Just a stiff neck.  Pt states she usually walks with a cane on her  R side. No falls within the last 6 months.      Currently in Pain?  Yes    Pain Score  6    L lateral leg burning.         [facility] PT Assessment - 11/02/17 1321      Observation/Other Assessments   Lower Extremity Functional Scale   19/80      Strength   Right Hip Flexion  2+/5                           PT Education - 11/02/17 1340    Education provided  Yes    Education Details  ther-ex    [employer]) Educated  Patient    Methods  Explanation;Demonstration;Tactile cues;Verbal cues    Comprehension  Returned demonstration;Verbalized understanding         Objectives  Pt wearing R AFO.   MedBridge Access Code: [email or web address]   Manual therapy  Seated STM to R quadratus lumborum to decrease tension   Seated STM R lumbar paraspinal muscles   Improved seated R hip flexion AROM from 64 degrees to 75 degrees sitting after  manual therapy   Therapeutic exercise   seated R hip flexion AROM multiple times   64 degrees    Reviewed POC 2x/week for 8 weeks   Seated bilateral shoulder extension isometrics, with hands on thighs 10x5 seconds for 3 sets  Sitting with upright posture  Manually resisted trunk flexion 10x3 with 5 second holds   Seated assisted R hip flexion 10x3  Muscle activation felt at end range  Seated R hip extension isometrics 10x5 seconds for 3 sets  Improved Seated R hip flexion AROM to 79 degrees   Improved exercise technique, movement at target joints, use of target muscles after min to mod verbal, visual, tactile cues.      Decreased R LE circumduction and improved R knee flexion during swing phase of gait observed after session. Pt states being better able to swing her legs afterwards while walking. Improved ability to perform R hip flexion following treatment to decrease R lumbar paraspinal and quadratus lumborum muscle tension as well as improving R glute max muscle activation.  Pt maintains 2+/5 seated R hip flexion strength but muscle activation felt towards end range hip flexion with PT assist. Slight decrease in LEFS score since last measured but pt was not able to participate as much with aquatherapy during last plan of care compared to the plan of care previous to that. Per pt the physical therapy at the pool helps her, especially with moving around afterwards. Pt also demonstrates tendency for regression when not going to physical therapy. Pt still demonstrates R LE weakness, difficulty performing hip flexion during swing phase of gait and difficulty performing standing tasks and walking and would benefit from continued skilled physical therapy services to address the aforementioned deficits.          PT Long Term Goals - 11/02/17 1412      PT LONG TERM GOAL #1   Title  Pt will be independent with her HEP to promote LE strength in function.    Time  8    Period  Weeks     Status  On-going    Target Date  12/30/17      PT LONG TERM GOAL #2   Title  Patient will improve bilateral LE strength by at least 1/2 MMT grade to promote ability to ambulate and perform  functional tasks.     Time  8    Period  Weeks    Status  Achieved      PT LONG TERM GOAL #3   Title  Patient will improve her LEFS score by at least 9 points as a demonstration of improved function.     Baseline  14/80 (07/29/2016); 18/80 (10/29/2016); 31/80 (12/09/2016); 18/80, pt however states increased bilateral knee pain recently (01/14/2017); 20/80 (04/12/17); 20/80 (05/20/2017); 26/80 (07/15/2017); 19/80 (11/02/2017)    Time  8    Period  Weeks    Status  Partially Met    Target Date  12/30/17      PT LONG TERM GOAL #4   Title  Pt will report being able to walk/stand with SPC over 35 min to promote mobility.     Baseline  Pt states being able to walk for about 30 min (07/29/2016); about 1 hour per pt reports (09/07/2016)    Time  8    Period  Weeks    Status  Achieved      PT LONG TERM GOAL #5   Title  Patient will have a decrease in bilateral LE pain to 5/10 or less at worst to promote ability to perform functional tasks.     Baseline  8/10 at worst (07/29/2016); Minimal burning sensation bilateral knee and legs, no pain level number provided. Pt also states feeling pain in her knees during the weekend. No pain level provided (09/07/2016); 3/10 L leg burning sensation at most for the past 7 days (09/28/2016); bilateral knee burning sensation 8/10 at worst (10/29/2016); 7-8/10 bilateral knee pain, burning comes and goes (12/09/2016); 7-8/10 R knee, 5/10 L knee pain at most for the past 7 days (01/14/2017); 6-7/10 burning in L lower leg (04/12/17); 1-2/10 (05/18/2017); 5/10  L knee and leg burning at most , 0/10 R knee and leg burning at most for the past 7 days (07/15/2017); No pain, just the burning mainly at the knees (09/02/2017); 0/10 R knee, 6/10  L leg at worst (11/02/2017)    Time  8    Period  Weeks     Status  Partially Met    Target Date  12/30/17      PT LONG TERM GOAL #6   Title  Pt will improve seated R hip flexion strength to at least 3+/5 to promote ability to ambulate and perform standing tasks.     Baseline  2+/5 seated R hip flexion (09/28/2016), (10/29/2016); (12/09/2016), (01/14/2017), (04/12/17); 2+/5 (05/20/2017), (07/15/2017), (09/02/2017), (11/02/2017)    Time  8    Period  Weeks    Status  On-going    Target Date  12/30/17      PT LONG TERM GOAL #7   Title  Pt will improve her LEFS score to 40/80 or more as a demonstration of improved function.     Baseline  31/80 (12/09/2016); 18/80, pt however states increased bilateral knee pain recently (01/14/2017); 20/80 (04/12/17), (05/20/2017); 26/80 (07/15/2016); 35/80 (09/02/2017); 19/80 (11/02/2017)    Time  8    Period  Weeks    Status  On-going    Target Date  12/30/17            Plan - 11/02/17 1345    Clinical Impression Statement  Decreased R LE circumduction and improved R knee flexion during swing phase of gait observed after session. Pt states being better able to swing her legs afterwards while walking. Improved ability to perform R hip flexion following  treatment to decrease R lumbar paraspinal and quadratus lumborum muscle tension as well as improving R glute max muscle activation.  Pt maintains 2+/5 seated R hip flexion strength but muscle activation felt towards end range hip flexion with PT assist. Slight decrease in LEFS score since last measured but pt was not able to participate as much with aquatherapy during last plan of care compared to the plan of care previous to that. Per pt the physical therapy at the pool helps her, especially with moving around afterwards. Pt also demonstrates tendency for regression when not going to physical therapy. Pt still demonstrates R LE weakness, difficulty performing hip flexion during swing phase of gait and difficulty performing standing tasks and walking and would benefit from continued  skilled physical therapy services to address the aforementioned deficits.     History and Personal Factors relevant to plan of care:  Chronicity of condition, weakness    Clinical Presentation  Stable    Clinical Presentation due to:  Decreased R knee pain, maintains 2+/5 R hip flexion strength    Clinical Decision Making  Low    Rehab Potential  Fair    Clinical Impairments Affecting Rehab Potential  Chronicity of condition, weakness    PT Frequency  2x / week    PT Duration  8 weeks    PT Treatment/Interventions  Electrical Stimulation;Aquatic Therapy;Ultrasound;Gait training;Functional mobility training;Therapeutic activities;Therapeutic exercise;Balance training;Neuromuscular re-education;Patient/family education;Manual techniques;Dry needling;Iontophoresis '4mg'$ /ml Dexamethasone    PT Next Visit Plan  aquatherapy, LE strengthening, modalities PRN    Consulted and Agree with Plan of Care  Patient       Patient will benefit from skilled therapeutic intervention in order to improve the following deficits and impairments:  Pain, Abnormal gait, Decreased balance, Decreased range of motion, Decreased strength, Difficulty walking  Visit Diagnosis: Muscle weakness (generalized) - Plan: PT plan of care cert/re-cert  Difficulty in walking, not elsewhere classified - Plan: PT plan of care cert/re-cert  Pain in right leg - Plan: PT plan of care cert/re-cert  Pain in left leg - Plan: PT plan of care cert/re-cert  Left knee pain, unspecified chronicity - Plan: PT plan of care cert/re-cert  Right knee pain, unspecified chronicity - Plan: PT plan of care cert/re-cert     Problem List Patient Active Problem List   Diagnosis Date Noted  . Weakness 11/03/2012   Thank you for your referral.  [name] PT, DPT   11/02/2017, 5:21 PM  [facility] [address], [place], [address] Phone: [phone]   Fax:   [phone]  Name: [name] MRN: [id number] Date of Birth: 11-23-1945

## 2017-11-03 DIAGNOSIS — G959 Disease of spinal cord, unspecified: Secondary | ICD-10-CM | POA: Diagnosis not present

## 2017-11-03 DIAGNOSIS — R5382 Chronic fatigue, unspecified: Secondary | ICD-10-CM | POA: Diagnosis not present

## 2017-11-03 DIAGNOSIS — M5136 Other intervertebral disc degeneration, lumbar region: Secondary | ICD-10-CM | POA: Diagnosis not present

## 2017-11-03 DIAGNOSIS — M6281 Muscle weakness (generalized): Secondary | ICD-10-CM | POA: Diagnosis not present

## 2017-11-04 ENCOUNTER — Ambulatory Visit: Payer: Medicare Other

## 2017-11-04 DIAGNOSIS — R2689 Other abnormalities of gait and mobility: Secondary | ICD-10-CM | POA: Diagnosis not present

## 2017-11-04 DIAGNOSIS — M5386 Other specified dorsopathies, lumbar region: Secondary | ICD-10-CM | POA: Diagnosis not present

## 2017-11-04 DIAGNOSIS — M6281 Muscle weakness (generalized): Secondary | ICD-10-CM | POA: Diagnosis not present

## 2017-11-04 DIAGNOSIS — R2681 Unsteadiness on feet: Secondary | ICD-10-CM | POA: Diagnosis not present

## 2017-11-04 DIAGNOSIS — R269 Unspecified abnormalities of gait and mobility: Secondary | ICD-10-CM | POA: Diagnosis not present

## 2017-11-04 DIAGNOSIS — R262 Difficulty in walking, not elsewhere classified: Secondary | ICD-10-CM | POA: Diagnosis not present

## 2017-11-04 NOTE — Therapy (Signed)
[facility] [address] [place], [place], [address] Phone: [phone]   Fax:  [phone]  Physical Therapy Treatment  Patient Details  Name: [name] MRN: [id number] Date of Birth: 02-Nov-1945 Referring Provider: Shary [name], [place]   Encounter Date: 11/04/2017  [name] End of Session - 11/04/17 1558    Visit Number  83    Number of Visits  125    Date for [name] Re-Evaluation  12/30/17    Authorization Type  8    Authorization Time Period  of 10 progress report    [name] Start Time  1315    [name] Stop Time  1400    [name] Time Calculation (min)  45 min    Activity Tolerance  Patient tolerated treatment well    Behavior During Therapy  [facility] for tasks assessed/performed       Past Medical History:  Diagnosis Date  . Arthritis   . Depression     Past Surgical History:  Procedure Laterality Date  . ABDOMINAL HYSTERECTOMY  1995  . back sugery    . BUNIONECTOMY  2013   rt foot  . COLONOSCOPY    . MUSCLE BIOPSY Left 10/03/2012   Procedure: LEFT QUADRICEP MUSCLE BIOPSY;  Surgeon: Nichole Hollingshead, MD;  Location: [facility];  Service: General;  Laterality: Left;  . NECK SURGERY  2010   cerv disc fused     There were no vitals filed for this visit.  Subjective Assessment - 11/04/17 1556    Subjective  [name] reports that she is doing well today. She denies any pain upon arrival. She continues with her HEP and reports that aquatic therapy has been very beneficial for her. She is mildly tired today as she took a car trip with her husband to [place] to celebrate their 50th anniversary.     Pertinent History  LE weakness.  Symptoms occured suddenly, unknown method of injury prior to her first neck fusion surgery on January 2010. Had lower back surgery fusion in 2011.  The neck and back surgeries did not help. [name] states having increased urinary urgency and takes medication for for it. MD aware.  Denies saddle anesthesia.   [name] states that her doctor told her that [name] is the only thing that is going to help her keep moving so she continues to participate in [name].  Last round of [name] was last year which helped.  Currently has difficulty walking, performing chores (wash dishes, laundry), cooking. Better able to do her tasks a little bit when she does therapy but gets harder when she stops.  Feels burning and stinging in both her knees, and bilateral anterior and lateral legs.  [name] states not having back or neck pain. Just a stiff neck.  [name] states she usually walks with a cane on her R side. No falls within the last 6 months.      Currently in Pain?  No/denies         TREATMENT   Aquatic Therapy [name] enters/exits pool via ramp;  Ambulation: 4 lengths forward/backward 4 lengths side stepping  LE strength, rail for light UE support Hip abduction x 15 BLE; Hip extension x 15 BLE; Hip flexion marches x 15 BLE; Heel raises x 15 BLE  Bench Bicycle kicks 2 min x 2;  Scissor kicks 2 min x 2; Flutter kicks 2 min x 2;  Single knee to chest bilateral 30s x 2; Standing knee flexion  stretch with noodle x 30s bilateral; Step-ups to pool step alternating LEs x 10 each;                     [name] Education - 11/04/17 1557    Education provided  Yes    Education Details  exercise form/technique    Person(s) Educated  Patient    Methods  Explanation    Comprehension  Verbalized understanding          [name] Long Term Goals - 11/02/17 1412      [name] LONG TERM GOAL #1   Title  [name] will be independent with her HEP to promote LE strength in function.    Time  8    Period  Weeks    Status  On-going    Target Date  12/30/17      [name] LONG TERM GOAL #2   Title  Patient will improve bilateral LE strength by at least 1/2 MMT grade to promote ability to ambulate and perform functional tasks.     Time  8    Period  Weeks    Status  Achieved      [name] LONG TERM GOAL #3   Title  Patient will improve her LEFS  score by at least 9 points as a demonstration of improved function.     Baseline  14/80 (07/29/2016); 18/80 (10/29/2016); 31/80 (12/09/2016); 18/80, [name] however states increased bilateral knee pain recently (01/14/2017); 20/80 (04/12/17); 20/80 (05/20/2017); 26/80 (07/15/2017); 19/80 (11/02/2017)    Time  8    Period  Weeks    Status  Partially Met    Target Date  12/30/17      [name] LONG TERM GOAL #4   Title  [name] will report being able to walk/stand with SPC over 35 min to promote mobility.     Baseline  [name] states being able to walk for about 30 min (07/29/2016); about 1 hour per [name] reports (09/07/2016)    Time  8    Period  Weeks    Status  Achieved      [name] LONG TERM GOAL #5   Title  Patient will have a decrease in bilateral LE pain to 5/10 or less at worst to promote ability to perform functional tasks.     Baseline  8/10 at worst (07/29/2016); Minimal burning sensation bilateral knee and legs, no pain level number provided. [name] also states feeling pain in her knees during the weekend. No pain level provided (09/07/2016); 3/10 L leg burning sensation at most for the past 7 days (09/28/2016); bilateral knee burning sensation 8/10 at worst (10/29/2016); 7-8/10 bilateral knee pain, burning comes and goes (12/09/2016); 7-8/10 R knee, 5/10 L knee pain at most for the past 7 days (01/14/2017); 6-7/10 burning in L lower leg (04/12/17); 1-2/10 (05/18/2017); 5/10  L knee and leg burning at most , 0/10 R knee and leg burning at most for the past 7 days (07/15/2017); No pain, just the burning mainly at the knees (09/02/2017); 0/10 R knee, 6/10  L leg at worst (11/02/2017)    Time  8    Period  Weeks    Status  Partially Met    Target Date  12/30/17      [name] LONG TERM GOAL #6   Title  [name] will improve seated R hip flexion strength to at least 3+/5 to promote ability to ambulate and perform standing tasks.     Baseline  2+/5 seated R hip flexion (  09/28/2016), (10/29/2016); (12/09/2016), (01/14/2017), (04/12/17); 2+/5 (05/20/2017),  (07/15/2017), (09/02/2017), (11/02/2017)    Time  8    Period  Weeks    Status  On-going    Target Date  12/30/17      [name] LONG TERM GOAL #7   Title  [name] will improve her LEFS score to 40/80 or more as a demonstration of improved function.     Baseline  31/80 (12/09/2016); 18/80, [name] however states increased bilateral knee pain recently (01/14/2017); 20/80 (04/12/17), (05/20/2017); 26/80 (07/15/2016); 35/80 (09/02/2017); 19/80 (11/02/2017)    Time  8    Period  Weeks    Status  On-going    Target Date  12/30/17            Plan - 11/04/17 1558    Clinical Impression Statement  [name] demonstrates excellent motivation with therapy and denies any pain during session. She is able to complete all exercises as instructed but is challenged by arm lifts during planks as well as step-ups. [name] encouraged to continue her HEP and follow-up as scheduled.     Rehab Potential  Fair    Clinical Impairments Affecting Rehab Potential  Chronicity of condition, weakness    [name] Frequency  2x / week    [name] Duration  8 weeks    [name] Treatment/Interventions  Electrical Stimulation;Aquatic Therapy;Ultrasound;Gait training;Functional mobility training;Therapeutic activities;Therapeutic exercise;Balance training;Neuromuscular re-education;Patient/family education;Manual techniques;Dry needling;Iontophoresis '4mg'$ /ml Dexamethasone    [name] Next Visit Plan  aquatic therapy, LE strengthening, modalities PRN    Consulted and Agree with Plan of Care  Patient       Patient will benefit from skilled therapeutic intervention in order to improve the following deficits and impairments:  Pain, Abnormal gait, Decreased balance, Decreased range of motion, Decreased strength, Difficulty walking  Visit Diagnosis: Difficulty in walking, not elsewhere classified  Muscle weakness (generalized)     Problem List Patient Active Problem List   Diagnosis Date Noted  . Weakness 11/03/2012   [name], [name], [name]   [name] 11/04/2017, 4:15 PM  [facility] SERVICES [address] [place], [place], [address] Phone: [phone]   Fax:  [phone]  Name: RUTHETTA KOOPMANN MRN: [id number] Date of Birth: 1945/11/25

## 2017-11-08 DIAGNOSIS — K219 Gastro-esophageal reflux disease without esophagitis: Secondary | ICD-10-CM | POA: Diagnosis not present

## 2017-11-08 DIAGNOSIS — Z23 Encounter for immunization: Secondary | ICD-10-CM | POA: Diagnosis not present

## 2017-11-08 DIAGNOSIS — F322 Major depressive disorder, single episode, severe without psychotic features: Secondary | ICD-10-CM | POA: Diagnosis not present

## 2017-11-08 DIAGNOSIS — G959 Disease of spinal cord, unspecified: Secondary | ICD-10-CM | POA: Diagnosis not present

## 2017-11-08 DIAGNOSIS — M6281 Muscle weakness (generalized): Secondary | ICD-10-CM | POA: Diagnosis not present

## 2017-11-09 ENCOUNTER — Ambulatory Visit: Payer: Medicare Other

## 2017-11-11 ENCOUNTER — Ambulatory Visit: Payer: Medicare Other

## 2017-11-16 ENCOUNTER — Ambulatory Visit: Payer: Medicare Other

## 2017-11-16 DIAGNOSIS — R262 Difficulty in walking, not elsewhere classified: Secondary | ICD-10-CM

## 2017-11-16 DIAGNOSIS — M6281 Muscle weakness (generalized): Secondary | ICD-10-CM | POA: Diagnosis not present

## 2017-11-16 DIAGNOSIS — M5386 Other specified dorsopathies, lumbar region: Secondary | ICD-10-CM | POA: Diagnosis not present

## 2017-11-16 DIAGNOSIS — R2681 Unsteadiness on feet: Secondary | ICD-10-CM | POA: Diagnosis not present

## 2017-11-16 DIAGNOSIS — R269 Unspecified abnormalities of gait and mobility: Secondary | ICD-10-CM | POA: Diagnosis not present

## 2017-11-16 DIAGNOSIS — R2689 Other abnormalities of gait and mobility: Secondary | ICD-10-CM | POA: Diagnosis not present

## 2017-11-16 NOTE — Therapy (Signed)
[facility] [address] [place], [address] Phone: [phone]   Fax:  [phone]  Physical Therapy Treatment  Patient Details  Name: [name] MRN: [id number] Date of Birth: 07-Dec-1945 Referring Provider: Shary Decamp, [place]   Encounter Date: 11/16/2017  PT End of Session - 11/16/17 1304    Visit Number  35    Number of Visits  125    Date for PT Re-Evaluation  12/30/17    Authorization Type  2    Authorization Time Period  of 10 progress report    PT Start Time  1304    PT Stop Time  1337    PT Time Calculation (min)  33 min    Activity Tolerance  Patient tolerated treatment well    Behavior During Therapy  [facility] for tasks assessed/performed       Past Medical History:  Diagnosis Date  . Arthritis   . Depression     Past Surgical History:  Procedure Laterality Date  . ABDOMINAL HYSTERECTOMY  1995  . back sugery    . BUNIONECTOMY  2013   rt foot  . COLONOSCOPY    . MUSCLE BIOPSY Left 10/03/2012   Procedure: LEFT QUADRICEP MUSCLE BIOPSY;  Surgeon: Odis Hollingshead, MD;  Location: [facility];  Service: General;  Laterality: Left;  . NECK SURGERY  2010   cerv disc fused     There were no vitals filed for this visit.  Subjective Assessment - 11/16/17 1306    Subjective  Pt states that she has a sinus infection. Also feels tired at bilateral anterior hips at the joint area.  The knees and the legs are fine. Has been wearing the copper knee braces, 4/10 bilateral knee and leg pain at most for the past 7 days. Has not really been up and moving. This is her second week for her sinus infection.   No fever.  Feels more tired than usual. Took a blue gel cap cold medicine at 10 am.     Pertinent History  LE weakness.  Symptoms occured suddenly, unknown method of injury prior to her first neck fusion surgery on January 2010. Had lower back surgery fusion in 2011.  The neck and back  surgeries did not help. Pt states having increased urinary urgency and takes medication for for it. MD aware.  Denies saddle anesthesia.  Pt states that her doctor told her that PT is the only thing that is going to help her keep moving so she continues to participate in PT.  Last round of PT was last year which helped.  Currently has difficulty walking, performing chores (wash dishes, laundry), cooking. Better able to do her tasks a little bit when she does therapy but gets harder when she stops.  Feels burning and stinging in both her knees, and bilateral anterior and lateral legs.  Pt states not having back or neck pain. Just a stiff neck.  Pt states she usually walks with a cane on her R side. No falls within the last 6 months.      Currently in Pain?  No/denies                               PT Education - 11/16/17 1324    Education provided  Yes    Education Details  ther-ex    [employer]) Educated  Patient  Methods  Explanation;Demonstration;Tactile cues;Verbal cues    Comprehension  Verbalized understanding         Objectives  Pt wearing R AFO.   MedBridge Access Code: [email or web address]   Manual therapy  Seated STM to R quadratus lumborum to decrease tension   Seated STM R lumbar paraspinal muscles    Therapeutic exercise         Sitting with upright posture             Manually resisted trunk flexion 10x3 with 5 second holds   Seated R hip extension isometrics 10x5 seconds for 2 sets  Seated assisted R hip flexion 5x2       Blood pressure L arm sitting, mechanically taken, normal cuff: 114/69, HR 58    Improved exercise technique, movement at target joints, use of target muscles after min to mod verbal, visual, tactile cues.   Short session today secondary to pt fatigue/tiredness from her sinus infection. Continued working on decreasing R quadratus lumborum and lumbar paraspinal muscle tension and improving trunk muscle and glute  max activation secondary to improved ability to perform R hip flexion at her previous land session.  Overall improved bilateral knee and leg pain after wearing her bilateral copper knee braces from her doctor. Pt will benefit from continued skilled physical therapy services to improve R LE strength, decrease bilateral knee and leg pain, and improve ability to perform standing tasks such as walking.        PT Long Term Goals - 11/02/17 1412      PT LONG TERM GOAL #1   Title  Pt will be independent with her HEP to promote LE strength in function.    Time  8    Period  Weeks    Status  On-going    Target Date  12/30/17      PT LONG TERM GOAL #2   Title  Patient will improve bilateral LE strength by at least 1/2 MMT grade to promote ability to ambulate and perform functional tasks.     Time  8    Period  Weeks    Status  Achieved      PT LONG TERM GOAL #3   Title  Patient will improve her LEFS score by at least 9 points as a demonstration of improved function.     Baseline  14/80 (07/29/2016); 18/80 (10/29/2016); 31/80 (12/09/2016); 18/80, pt however states increased bilateral knee pain recently (01/14/2017); 20/80 (04/12/17); 20/80 (05/20/2017); 26/80 (07/15/2017); 19/80 (11/02/2017)    Time  8    Period  Weeks    Status  Partially Met    Target Date  12/30/17      PT LONG TERM GOAL #4   Title  Pt will report being able to walk/stand with SPC over 35 min to promote mobility.     Baseline  Pt states being able to walk for about 30 min (07/29/2016); about 1 hour per pt reports (09/07/2016)    Time  8    Period  Weeks    Status  Achieved      PT LONG TERM GOAL #5   Title  Patient will have a decrease in bilateral LE pain to 5/10 or less at worst to promote ability to perform functional tasks.     Baseline  8/10 at worst (07/29/2016); Minimal burning sensation bilateral knee and legs, no pain level number provided. Pt also states feeling pain in her knees during the weekend. No pain level provided  (09/07/2016);  3/10 L leg burning sensation at most for the past 7 days (09/28/2016); bilateral knee burning sensation 8/10 at worst (10/29/2016); 7-8/10 bilateral knee pain, burning comes and goes (12/09/2016); 7-8/10 R knee, 5/10 L knee pain at most for the past 7 days (01/14/2017); 6-7/10 burning in L lower leg (04/12/17); 1-2/10 (05/18/2017); 5/10  L knee and leg burning at most , 0/10 R knee and leg burning at most for the past 7 days (07/15/2017); No pain, just the burning mainly at the knees (09/02/2017); 0/10 R knee, 6/10  L leg at worst (11/02/2017)    Time  8    Period  Weeks    Status  Partially Met    Target Date  12/30/17      PT LONG TERM GOAL #6   Title  Pt will improve seated R hip flexion strength to at least 3+/5 to promote ability to ambulate and perform standing tasks.     Baseline  2+/5 seated R hip flexion (09/28/2016), (10/29/2016); (12/09/2016), (01/14/2017), (04/12/17); 2+/5 (05/20/2017), (07/15/2017), (09/02/2017), (11/02/2017)    Time  8    Period  Weeks    Status  On-going    Target Date  12/30/17      PT LONG TERM GOAL #7   Title  Pt will improve her LEFS score to 40/80 or more as a demonstration of improved function.     Baseline  31/80 (12/09/2016); 18/80, pt however states increased bilateral knee pain recently (01/14/2017); 20/80 (04/12/17), (05/20/2017); 26/80 (07/15/2016); 35/80 (09/02/2017); 19/80 (11/02/2017)    Time  8    Period  Weeks    Status  On-going    Target Date  12/30/17            Plan - 11/16/17 1335    Clinical Impression Statement  Short session today secondary to pt fatigue/tiredness from her sinus infection. Continued working on decreasing R quadratus lumborum and lumbar paraspinal muscle tension and improving trunk muscle and glute max activation secondary to improved ability to perform R hip flexion at her previous land session.  Overall improved bilateral knee and leg pain after wearing her bilateral copper knee braces from her doctor. Pt will benefit from  continued skilled physical therapy services to improve R LE strength, decrease bilateral knee and leg pain, and improve ability to perform standing tasks such as walking.     History and Personal Factors relevant to plan of care:  --    Clinical Presentation  --    Rehab Potential  Fair    Clinical Impairments Affecting Rehab Potential  Chronicity of condition, weakness    PT Frequency  2x / week    PT Duration  8 weeks    PT Treatment/Interventions  Electrical Stimulation;Aquatic Therapy;Ultrasound;Gait training;Functional mobility training;Therapeutic activities;Therapeutic exercise;Balance training;Neuromuscular re-education;Patient/family education;Manual techniques;Dry needling;Iontophoresis '4mg'$ /ml Dexamethasone    PT Next Visit Plan  aquatic therapy, LE strengthening, modalities PRN    Consulted and Agree with Plan of Care  Patient       Patient will benefit from skilled therapeutic intervention in order to improve the following deficits and impairments:  Pain, Abnormal gait, Decreased balance, Decreased range of motion, Decreased strength, Difficulty walking  Visit Diagnosis: Difficulty in walking, not elsewhere classified  Muscle weakness (generalized)     Problem List Patient Active Problem List   Diagnosis Date Noted  . Weakness 11/03/2012     [name] PT, DPT   11/16/2017, 7:28 PM  [facility] [address] [place], [address] Phone: [phone]   Fax:  [phone]  Name: [name] MRN: [id number] Date of Birth: 13-Sep-1945

## 2017-11-18 ENCOUNTER — Other Ambulatory Visit: Payer: Self-pay

## 2017-11-18 ENCOUNTER — Ambulatory Visit: Payer: Medicare Other

## 2017-11-18 DIAGNOSIS — R29898 Other symptoms and signs involving the musculoskeletal system: Secondary | ICD-10-CM

## 2017-11-18 DIAGNOSIS — M545 Low back pain, unspecified: Secondary | ICD-10-CM

## 2017-11-18 DIAGNOSIS — R269 Unspecified abnormalities of gait and mobility: Secondary | ICD-10-CM

## 2017-11-18 DIAGNOSIS — M5386 Other specified dorsopathies, lumbar region: Secondary | ICD-10-CM

## 2017-11-18 DIAGNOSIS — M6281 Muscle weakness (generalized): Secondary | ICD-10-CM

## 2017-11-18 DIAGNOSIS — R2689 Other abnormalities of gait and mobility: Secondary | ICD-10-CM

## 2017-11-18 DIAGNOSIS — M79605 Pain in left leg: Secondary | ICD-10-CM

## 2017-11-18 DIAGNOSIS — R2681 Unsteadiness on feet: Secondary | ICD-10-CM | POA: Diagnosis not present

## 2017-11-18 DIAGNOSIS — M2560 Stiffness of unspecified joint, not elsewhere classified: Secondary | ICD-10-CM

## 2017-11-18 DIAGNOSIS — M79604 Pain in right leg: Secondary | ICD-10-CM

## 2017-11-18 DIAGNOSIS — R262 Difficulty in walking, not elsewhere classified: Secondary | ICD-10-CM | POA: Diagnosis not present

## 2017-11-18 NOTE — Therapy (Signed)
[place] [facility] SERVICES [address] [place], [place], [address] Phone: [phone]   Fax:  [phone]  Physical Therapy Treatment  Patient Details  Name: [name] MRN: [id number] Date of Birth: [birth year] Referring Provider: Shary Decamp, [place]   Encounter Date: 11/18/2017  PT End of Session - 11/18/17 1300    Visit Number  1    Number of Visits  125    Date for PT Re-Evaluation  12/30/17    Authorization Type  3    Authorization Time Period  of 10 progress report    PT Start Time  1130    PT Stop Time  1230    PT Time Calculation (min)  60 min    Activity Tolerance  Patient tolerated treatment well    Behavior During Therapy  [facility] for tasks assessed/performed       Past Medical History:  Diagnosis Date  . Arthritis   . Depression     Past Surgical History:  Procedure Laterality Date  . ABDOMINAL HYSTERECTOMY  1995  . back sugery    . BUNIONECTOMY  2013   rt foot  . COLONOSCOPY    . MUSCLE BIOPSY Left 10/03/2012   Procedure: LEFT QUADRICEP MUSCLE BIOPSY;  Surgeon: Odis Hollingshead, MD;  Location: [facility];  Service: General;  Laterality: Left;  . NECK SURGERY  2010   cerv disc fused     There were no vitals filed for this visit.  Subjective Assessment - 11/18/17 1258    Subjective  Pt reports having a "glum" day, but unsure as to why. Emotional encouragment provided. Denies pain/increasing difficulties with LEs/back.     Pertinent History  LE weakness.  Symptoms occured suddenly, unknown method of injury prior to her first neck fusion surgery on January 2010. Had lower back surgery fusion in 2011.  The neck and back surgeries did not help. Pt states having increased urinary urgency and takes medication for for it. MD aware.  Denies saddle anesthesia.  Pt states that her doctor told her that PT is the only thing that is going to help her keep moving so she continues to participate in PT.  Last  round of PT was last year which helped.  Currently has difficulty walking, performing chores (wash dishes, laundry), cooking. Better able to do her tasks a little bit when she does therapy but gets harder when she stops.  Feels burning and stinging in both her knees, and bilateral anterior and lateral legs.  Pt states not having back or neck pain. Just a stiff neck.  Pt states she usually walks with a cane on her R side. No falls within the last 6 months.        Enters/exits via ramp  Ambulation  4 L fwd, warm up  4 L side, warm up  4 L side with mitts, palms facing, for added core strengthening  4 L fwd with mitts, thumbs up for added resistance  Core stabilization, wall sit position  Mitts, 30x ea   Sh horiz abd/adduction   Sh flex/ext   Sh abd/add  Core stabilization with LE strength, at rail, 30x ea  B hip abd/add   B hip flex/ext  Squats   Step  Partial step up, B, 30x ea  Active stretching  LB at rail, Center/L/R 4x ea  Hamstrings/hip flexor and quad, B, 4x ea  Hip Abd/Adductors, B 4x ea  PT Education - 11/18/17 1259    Education provided  Yes    Education Details  Continued on core stabilization. Ambulation with mild increased resist using mitts    Person(s) Educated  Patient    Methods  Explanation;Demonstration    Comprehension  Verbalized understanding;Returned demonstration          PT Long Term Goals - 11/02/17 1412      PT LONG TERM GOAL #1   Title  Pt will be independent with her HEP to promote LE strength in function.    Time  8    Period  Weeks    Status  On-going    Target Date  12/30/17      PT LONG TERM GOAL #2   Title  Patient will improve bilateral LE strength by at least 1/2 MMT grade to promote ability to ambulate and perform functional tasks.     Time  8    Period  Weeks    Status  Achieved      PT LONG TERM GOAL #3   Title  Patient will improve her LEFS score by at least 9 points as a  demonstration of improved function.     Baseline  14/80 (07/29/2016); 18/80 (10/29/2016); 31/80 (12/09/2016); 18/80, pt however states increased bilateral knee pain recently (01/14/2017); 20/80 (04/12/17); 20/80 (05/20/2017); 26/80 (07/15/2017); 19/80 (11/02/2017)    Time  8    Period  Weeks    Status  Partially Met    Target Date  12/30/17      PT LONG TERM GOAL #4   Title  Pt will report being able to walk/stand with SPC over 35 min to promote mobility.     Baseline  Pt states being able to walk for about 30 min (07/29/2016); about 1 hour per pt reports (09/07/2016)    Time  8    Period  Weeks    Status  Achieved      PT LONG TERM GOAL #5   Title  Patient will have a decrease in bilateral LE pain to 5/10 or less at worst to promote ability to perform functional tasks.     Baseline  8/10 at worst (07/29/2016); Minimal burning sensation bilateral knee and legs, no pain level number provided. Pt also states feeling pain in her knees during the weekend. No pain level provided (09/07/2016); 3/10 L leg burning sensation at most for the past 7 days (09/28/2016); bilateral knee burning sensation 8/10 at worst (10/29/2016); 7-8/10 bilateral knee pain, burning comes and goes (12/09/2016); 7-8/10 R knee, 5/10 L knee pain at most for the past 7 days (01/14/2017); 6-7/10 burning in L lower leg (04/12/17); 1-2/10 (05/18/2017); 5/10  L knee and leg burning at most , 0/10 R knee and leg burning at most for the past 7 days (07/15/2017); No pain, just the burning mainly at the knees (09/02/2017); 0/10 R knee, 6/10  L leg at worst (11/02/2017)    Time  8    Period  Weeks    Status  Partially Met    Target Date  12/30/17      PT LONG TERM GOAL #6   Title  Pt will improve seated R hip flexion strength to at least 3+/5 to promote ability to ambulate and perform standing tasks.     Baseline  2+/5 seated R hip flexion (09/28/2016), (10/29/2016); (12/09/2016), (01/14/2017), (04/12/17); 2+/5 (05/20/2017), (07/15/2017), (09/02/2017), (11/02/2017)     Time  8    Period  Weeks  Status  On-going    Target Date  12/30/17      PT LONG TERM GOAL #7   Title  Pt will improve her LEFS score to 40/80 or more as a demonstration of improved function.     Baseline  31/80 (12/09/2016); 18/80, pt however states increased bilateral knee pain recently (01/14/2017); 20/80 (04/12/17), (05/20/2017); 26/80 (07/15/2016); 35/80 (09/02/2017); 19/80 (11/02/2017)    Time  8    Period  Weeks    Status  On-going    Target Date  12/30/17            Plan - 11/18/17 1301    Clinical Impression Statement  Pt tolerates session well; notes increased use of core with use of mitts with fwd and side ambulation, as well as with wall sit core stabilization exercises. Cues for core engagement/posture with standing core/LE exercises. Notes active stretching through LEs and LB feel good. Encouraged provided throughout session/pt conversational and with positive self help attitude toward down feelings today. Continue to progress strength, endurance in core/LE to improve function and safety of ambulation.     Rehab Potential  Fair    Clinical Impairments Affecting Rehab Potential  Chronicity of condition, weakness    PT Frequency  2x / week    PT Duration  8 weeks    PT Treatment/Interventions  Electrical Stimulation;Aquatic Therapy;Ultrasound;Gait training;Functional mobility training;Therapeutic activities;Therapeutic exercise;Balance training;Neuromuscular re-education;Patient/family education;Manual techniques;Dry needling;Iontophoresis 95m/ml Dexamethasone    PT Next Visit Plan  aquatic therapy, LE strengthening, modalities PRN    Consulted and Agree with Plan of Care  Patient       Patient will benefit from skilled therapeutic intervention in order to improve the following deficits and impairments:  Pain, Abnormal gait, Decreased balance, Decreased range of motion, Decreased strength, Difficulty walking  Visit Diagnosis: Difficulty in walking, not elsewhere  classified  Pain in right leg  Muscle weakness (generalized)  Pain in left leg  Unsteadiness on feet  Other abnormalities of gait and mobility  Unsteadiness  Decreased ROM of lumbar spine  Abnormality of gait  Weakness of both legs  Joint stiffness of spine  Bilateral low back pain without sciatica, unspecified chronicity     Problem List Patient Active Problem List   Diagnosis Date Noted  . Weakness 11/03/2012    HLarae Grooms8/29/2019, 1:08 PM  [place] [address] [place] [address]Phone: [phone]  Fax:  [phone] Name: Nichole DOLSMRN: [id number] of Birth: 203/05/1945

## 2017-11-23 ENCOUNTER — Other Ambulatory Visit: Payer: Self-pay

## 2017-11-23 ENCOUNTER — Ambulatory Visit: Payer: Medicare Other | Attending: Physician Assistant

## 2017-11-23 DIAGNOSIS — M6281 Muscle weakness (generalized): Secondary | ICD-10-CM | POA: Insufficient documentation

## 2017-11-23 DIAGNOSIS — M5386 Other specified dorsopathies, lumbar region: Secondary | ICD-10-CM | POA: Diagnosis not present

## 2017-11-23 DIAGNOSIS — R2681 Unsteadiness on feet: Secondary | ICD-10-CM | POA: Diagnosis not present

## 2017-11-23 DIAGNOSIS — R262 Difficulty in walking, not elsewhere classified: Secondary | ICD-10-CM | POA: Diagnosis not present

## 2017-11-23 DIAGNOSIS — R29898 Other symptoms and signs involving the musculoskeletal system: Secondary | ICD-10-CM | POA: Diagnosis not present

## 2017-11-23 DIAGNOSIS — R2689 Other abnormalities of gait and mobility: Secondary | ICD-10-CM | POA: Insufficient documentation

## 2017-11-23 DIAGNOSIS — R269 Unspecified abnormalities of gait and mobility: Secondary | ICD-10-CM | POA: Diagnosis not present

## 2017-11-23 DIAGNOSIS — M2560 Stiffness of unspecified joint, not elsewhere classified: Secondary | ICD-10-CM | POA: Diagnosis not present

## 2017-11-23 NOTE — Therapy (Signed)
[place] MAIN [facility] SERVICES [address] [place], [place], [address] Phone: [phone]   Fax:  [phone]  Physical Therapy Treatment  Patient Details  Name: [name] MRN: [id number] Date of Birth: 03-17-1946 Referring Provider: Shary Decamp, [place]   Encounter Date: 11/23/2017  PT End of Session - 11/23/17 1245    Visit Number  19    Number of Visits  125    Date for PT Re-Evaluation  12/30/17    Authorization Type  4    Authorization Time Period  of 10 progress report    PT Start Time  1130    PT Stop Time  1230    PT Time Calculation (min)  60 min    Activity Tolerance  Patient tolerated treatment well    Behavior During Therapy  [facility] for tasks assessed/performed       Past Medical History:  Diagnosis Date  . Arthritis   . Depression     Past Surgical History:  Procedure Laterality Date  . ABDOMINAL HYSTERECTOMY  1995  . back sugery    . BUNIONECTOMY  2013   rt foot  . COLONOSCOPY    . MUSCLE BIOPSY Left 10/03/2012   Procedure: LEFT QUADRICEP MUSCLE BIOPSY;  Surgeon: Odis Hollingshead, MD;  Location: [place];  Service: General;  Laterality: Left;  . NECK SURGERY  2010   cerv disc fused     There were no vitals filed for this visit.  Subjective Assessment - 11/23/17 1242    Subjective  Pt reports emotionally feeling better today. The usual presentation in RLE with weakness and difficulty clearing R foot with ambulation, but no pain reported.     Pertinent History  LE weakness.  Symptoms occured suddenly, unknown method of injury prior to her first neck fusion surgery on January 2010. Had lower back surgery fusion in 2011.  The neck and back surgeries did not help. Pt states having increased urinary urgency and takes medication for for it. MD aware.  Denies saddle anesthesia.  Pt states that her doctor told her that PT is the only thing that is going to help her keep moving so she continues to  participate in PT.  Last round of PT was last year which helped.  Currently has difficulty walking, performing chores (wash dishes, laundry), cooking. Better able to do her tasks a little bit when she does therapy but gets harder when she stops.  Feels burning and stinging in both her knees, and bilateral anterior and lateral legs.  Pt states not having back or neck pain. Just a stiff neck.  Pt states she usually walks with a cane on her R side. No falls within the last 6 months.        Enters/exits via ramp  Ambulation, warm up  4 L fwd  4 L side  Ambulation with core resist and increased speed  Mitts   4 L fwd, shoulders ER, thumb up   4 L side, elbows 90 deg, UE fwd, thumb up  Squats, 30x  Partial step ups, B 20x ea  Core/LE strength, 20x ea  Bench   Straight leg up and outs  Core with UE strength, 30x ea  Mitts   Sh abd/add   Sh flex/ext   Sh horiz abd/add  Ambulation with core resist and increased speed as above  Side Lunges (stepping), B 20x ea at rail (for light support)  Active stretching, 4x ea  Hams/hip flexor/quads  Hip abd/adductors  LB right, middle, center                           PT Education - 11/23/17 1244    Education provided  Yes    Education Details  continued core stabilization. Technique for lunges re educated.     Person(s) Educated  Patient    Methods  Explanation;Demonstration    Comprehension  Verbalized understanding;Verbal cues required          PT Long Term Goals - 11/02/17 1412      PT LONG TERM GOAL #1   Title  Pt will be independent with her HEP to promote LE strength in function.    Time  8    Period  Weeks    Status  On-going    Target Date  12/30/17      PT LONG TERM GOAL #2   Title  Patient will improve bilateral LE strength by at least 1/2 MMT grade to promote ability to ambulate and perform functional tasks.     Time  8    Period  Weeks    Status  Achieved      PT LONG TERM GOAL #3   Title   Patient will improve her LEFS score by at least 9 points as a demonstration of improved function.     Baseline  14/80 (07/29/2016); 18/80 (10/29/2016); 31/80 (12/09/2016); 18/80, pt however states increased bilateral knee pain recently (01/14/2017); 20/80 (04/12/17); 20/80 (05/20/2017); 26/80 (07/15/2017); 19/80 (11/02/2017)    Time  8    Period  Weeks    Status  Partially Met    Target Date  12/30/17      PT LONG TERM GOAL #4   Title  Pt will report being able to walk/stand with SPC over 35 min to promote mobility.     Baseline  Pt states being able to walk for about 30 min (07/29/2016); about 1 hour per pt reports (09/07/2016)    Time  8    Period  Weeks    Status  Achieved      PT LONG TERM GOAL #5   Title  Patient will have a decrease in bilateral LE pain to 5/10 or less at worst to promote ability to perform functional tasks.     Baseline  8/10 at worst (07/29/2016); Minimal burning sensation bilateral knee and legs, no pain level number provided. Pt also states feeling pain in her knees during the weekend. No pain level provided (09/07/2016); 3/10 L leg burning sensation at most for the past 7 days (09/28/2016); bilateral knee burning sensation 8/10 at worst (10/29/2016); 7-8/10 bilateral knee pain, burning comes and goes (12/09/2016); 7-8/10 R knee, 5/10 L knee pain at most for the past 7 days (01/14/2017); 6-7/10 burning in L lower leg (04/12/17); 1-2/10 (05/18/2017); 5/10  L knee and leg burning at most , 0/10 R knee and leg burning at most for the past 7 days (07/15/2017); No pain, just the burning mainly at the knees (09/02/2017); 0/10 R knee, 6/10  L leg at worst (11/02/2017)    Time  8    Period  Weeks    Status  Partially Met    Target Date  12/30/17      PT LONG TERM GOAL #6   Title  Pt will improve seated R hip flexion strength to at least 3+/5 to promote ability to ambulate and perform standing tasks.  Baseline  2+/5 seated R hip flexion (09/28/2016), (10/29/2016); (12/09/2016), (01/14/2017),  (04/12/17); 2+/5 (05/20/2017), (07/15/2017), (09/02/2017), (11/02/2017)    Time  8    Period  Weeks    Status  On-going    Target Date  12/30/17      PT LONG TERM GOAL #7   Title  Pt will improve her LEFS score to 40/80 or more as a demonstration of improved function.     Baseline  31/80 (12/09/2016); 18/80, pt however states increased bilateral knee pain recently (01/14/2017); 20/80 (04/12/17), (05/20/2017); 26/80 (07/15/2016); 35/80 (09/02/2017); 19/80 (11/02/2017)    Time  8    Period  Weeks    Status  On-going    Target Date  12/30/17            Plan - 11/23/17 1246    Clinical Impression Statement  Pt tolerating session well. Continues to require cues for form/technique throughout exercises. Improving ambulation with speed and resist in regards to form for posture and RLE clearance. Improed RLE form with lunging and straight leg up and outs with intermittent cues. Continue to progress strength.     Rehab Potential  Fair    Clinical Impairments Affecting Rehab Potential  Chronicity of condition, weakness    PT Frequency  2x / week    PT Duration  8 weeks    PT Treatment/Interventions  Electrical Stimulation;Aquatic Therapy;Ultrasound;Gait training;Functional mobility training;Therapeutic activities;Therapeutic exercise;Balance training;Neuromuscular re-education;Patient/family education;Manual techniques;Dry needling;Iontophoresis '4mg'$ /ml Dexamethasone    PT Next Visit Plan  aquatic therapy, LE strengthening, modalities PRN    Consulted and Agree with Plan of Care  Patient       Patient will benefit from skilled therapeutic intervention in order to improve the following deficits and impairments:  Pain, Abnormal gait, Decreased balance, Decreased range of motion, Decreased strength, Difficulty walking  Visit Diagnosis: Difficulty in walking, not elsewhere classified  Muscle weakness (generalized)  Unsteadiness on feet  Other abnormalities of gait and  mobility  Unsteadiness  Decreased ROM of lumbar spine  Abnormality of gait  Weakness of both legs  Joint stiffness of spine     Problem List Patient Active Problem List   Diagnosis Date Noted  . Weakness 11/03/2012    Larae Grooms 11/23/2017, 12:52 PM  [place] [facility] MAIN [facility] SERVICES [address] [place], [place], [address] Phone: [phone]   Fax:  [phone]  Name: [name] MRN: [id number] Date of Birth: [birth year]

## 2017-11-24 ENCOUNTER — Ambulatory Visit (INDEPENDENT_AMBULATORY_CARE_PROVIDER_SITE_OTHER): Payer: Medicare Other | Admitting: Orthopaedic Surgery

## 2017-11-25 ENCOUNTER — Ambulatory Visit

## 2017-11-30 ENCOUNTER — Ambulatory Visit: Payer: Medicare Other

## 2017-11-30 DIAGNOSIS — R269 Unspecified abnormalities of gait and mobility: Secondary | ICD-10-CM | POA: Diagnosis not present

## 2017-11-30 DIAGNOSIS — R2681 Unsteadiness on feet: Secondary | ICD-10-CM | POA: Diagnosis not present

## 2017-11-30 DIAGNOSIS — M6281 Muscle weakness (generalized): Secondary | ICD-10-CM

## 2017-11-30 DIAGNOSIS — R262 Difficulty in walking, not elsewhere classified: Secondary | ICD-10-CM

## 2017-11-30 DIAGNOSIS — M5386 Other specified dorsopathies, lumbar region: Secondary | ICD-10-CM | POA: Diagnosis not present

## 2017-11-30 DIAGNOSIS — R2689 Other abnormalities of gait and mobility: Secondary | ICD-10-CM | POA: Diagnosis not present

## 2017-11-30 NOTE — Therapy (Signed)
[place] [facility] [address] [place], [address] Phone: [phone]   Fax:  [phone]  Physical Therapy Treatment  Patient Details  Name: [name] MRN: [id number] Date of Birth: 1946/02/27 Referring Provider: Shary Decamp, [place]   Encounter Date: 11/30/2017  PT End of Session - 11/30/17 1438    Visit Number  70    Number of Visits  125    Date for PT Re-Evaluation  12/30/17    Authorization Type  5    Authorization Time Period  of 10 progress report    PT Start Time  1438    PT Stop Time  1520    PT Time Calculation (min)  42 min    Activity Tolerance  Patient tolerated treatment well    Behavior During Therapy  [facility] for tasks assessed/performed       Past Medical History:  Diagnosis Date  . Arthritis   . Depression     Past Surgical History:  Procedure Laterality Date  . ABDOMINAL HYSTERECTOMY  1995  . back sugery    . BUNIONECTOMY  2013   rt foot  . COLONOSCOPY    . MUSCLE BIOPSY Left 10/03/2012   Procedure: LEFT QUADRICEP MUSCLE BIOPSY;  Surgeon: Odis Hollingshead, MD;  Location: [place];  Service: General;  Laterality: Left;  . NECK SURGERY  2010   cerv disc fused     There were no vitals filed for this visit.  Subjective Assessment - 11/30/17 1440    Subjective  About the same. Got some knee pads. Thinks it could be helping her knees. The aquatherapy is still good. Just the usual. No pain. A little tired today.   Did some housework.  Pt states doing better functionally when doing PT compared to not participating in PT.     Pertinent History  LE weakness.  Symptoms occured suddenly, unknown method of injury prior to her first neck fusion surgery on January 2010. Had lower back surgery fusion in 2011.  The neck and back surgeries did not help. Pt states having increased urinary urgency and takes medication for for it. MD aware.  Denies saddle anesthesia.  Pt states that  her doctor told her that PT is the only thing that is going to help her keep moving so she continues to participate in PT.  Last round of PT was last year which helped.  Currently has difficulty walking, performing chores (wash dishes, laundry), cooking. Better able to do her tasks a little bit when she does therapy but gets harder when she stops.  Feels burning and stinging in both her knees, and bilateral anterior and lateral legs.  Pt states not having back or neck pain. Just a stiff neck.  Pt states she usually walks with a cane on her R side. No falls within the last 6 months.      Currently in Pain?  No/denies                               PT Education - 11/30/17 1507    Education provided  Yes    Education Details  ther-ex    [employer]) Educated  Patient    Methods  Explanation;Demonstration;Tactile cues;Verbal cues    Comprehension  Returned demonstration;Verbalized understanding         Objectives  Pt not wearing R AFO.   MedBridge Access Code:  [email or web address]   Manual therapy  Seated STM to R quadratus lumborum to decrease tension   Seated STM R lumbar paraspinal muscles    Therapeutic exercise    Sitting with upright posture Manually resisted trunk flexion 10x2 with 10 second holds    Manual Trunk perturbation from PT, 1 min x 3   Sit <> Stand from elevated mat table with emphasis on femoral control, 24 inches high with bilateral UE assist 10x  Then without UE assist 10x  Standing R LE leg press resisting blue band with bilateral UE assist 10x3  Seated assisted R hip flexion to end range with PT with 5 second holds 5x3  Improved exercise technique, movement at target joints, use of target muscles after min to mod verbal, visual, tactile cues.   Continued working on decreasing tension to R lumbar paraspinal and quadratus lumborum muscles as well as improving core and R hip flexor muscle use. Pt demonstrates  tendency for use of abdominal muscles to posteriorly tilt pelvis to help flex R hip during swing phase of gait. Aslo continued working on improving LE strength and femoral control to promote ability to perform standing tasks with less knee and leg pain. Improved R hip flexion during swing phase of gait observed at end of session. Pt tolerated session well without aggravation of symptoms. Pt will benefit from continued skilled physical therapy services secondary to pt reports of better function when participating in PT compared to not participating in PT. Pt will also benefit from continued skilled physical therapy services to improve core and hip strength, femoral control, and improve ability to perform functional tasks.               PT Long Term Goals - 11/02/17 1412      PT LONG TERM GOAL #1   Title  Pt will be independent with her HEP to promote LE strength in function.    Time  8    Period  Weeks    Status  On-going    Target Date  12/30/17      PT LONG TERM GOAL #2   Title  Patient will improve bilateral LE strength by at least 1/2 MMT grade to promote ability to ambulate and perform functional tasks.     Time  8    Period  Weeks    Status  Achieved      PT LONG TERM GOAL #3   Title  Patient will improve her LEFS score by at least 9 points as a demonstration of improved function.     Baseline  14/80 (07/29/2016); 18/80 (10/29/2016); 31/80 (12/09/2016); 18/80, pt however states increased bilateral knee pain recently (01/14/2017); 20/80 (04/12/17); 20/80 (05/20/2017); 26/80 (07/15/2017); 19/80 (11/02/2017)    Time  8    Period  Weeks    Status  Partially Met    Target Date  12/30/17      PT LONG TERM GOAL #4   Title  Pt will report being able to walk/stand with SPC over 35 min to promote mobility.     Baseline  Pt states being able to walk for about 30 min (07/29/2016); about 1 hour per pt reports (09/07/2016)    Time  8    Period  Weeks    Status  Achieved      PT LONG TERM GOAL  #5   Title  Patient will have a decrease in bilateral LE pain to 5/10 or less at worst to promote ability to perform functional  tasks.     Baseline  8/10 at worst (07/29/2016); Minimal burning sensation bilateral knee and legs, no pain level number provided. Pt also states feeling pain in her knees during the weekend. No pain level provided (09/07/2016); 3/10 L leg burning sensation at most for the past 7 days (09/28/2016); bilateral knee burning sensation 8/10 at worst (10/29/2016); 7-8/10 bilateral knee pain, burning comes and goes (12/09/2016); 7-8/10 R knee, 5/10 L knee pain at most for the past 7 days (01/14/2017); 6-7/10 burning in L lower leg (04/12/17); 1-2/10 (05/18/2017); 5/10  L knee and leg burning at most , 0/10 R knee and leg burning at most for the past 7 days (07/15/2017); No pain, just the burning mainly at the knees (09/02/2017); 0/10 R knee, 6/10  L leg at worst (11/02/2017)    Time  8    Period  Weeks    Status  Partially Met    Target Date  12/30/17      PT LONG TERM GOAL #6   Title  Pt will improve seated R hip flexion strength to at least 3+/5 to promote ability to ambulate and perform standing tasks.     Baseline  2+/5 seated R hip flexion (09/28/2016), (10/29/2016); (12/09/2016), (01/14/2017), (04/12/17); 2+/5 (05/20/2017), (07/15/2017), (09/02/2017), (11/02/2017)    Time  8    Period  Weeks    Status  On-going    Target Date  12/30/17      PT LONG TERM GOAL #7   Title  Pt will improve her LEFS score to 40/80 or more as a demonstration of improved function.     Baseline  31/80 (12/09/2016); 18/80, pt however states increased bilateral knee pain recently (01/14/2017); 20/80 (04/12/17), (05/20/2017); 26/80 (07/15/2016); 35/80 (09/02/2017); 19/80 (11/02/2017)    Time  8    Period  Weeks    Status  On-going    Target Date  12/30/17            Plan - 11/30/17 1507    Clinical Impression Statement  Continued working on decreasing tension to R lumbar paraspinal and quadratus lumborum muscles as  well as improving core and R hip flexor muscle use. Pt demonstrates tendency for use of abdominal muscles to posteriorly tilt pelvis to help flex R hip during swing phase of gait. Aslo continued working on improving LE strength and femoral control to promote ability to perform standing tasks with less knee and leg pain. Improved R hip flexion during swing phase of gait observed at end of session. Pt tolerated session well without aggravation of symptoms. Pt will benefit from continued skilled physical therapy services secondary to pt reports of better function when participating in PT compared to not participating in PT. Pt will also benefit from continued skilled physical therapy services to improve core and hip strength, femoral control, and improve ability to perform functional tasks.     Rehab Potential  Fair    Clinical Impairments Affecting Rehab Potential  Chronicity of condition, weakness    PT Frequency  2x / week    PT Duration  8 weeks    PT Treatment/Interventions  Electrical Stimulation;Aquatic Therapy;Ultrasound;Gait training;Functional mobility training;Therapeutic activities;Therapeutic exercise;Balance training;Neuromuscular re-education;Patient/family education;Manual techniques;Dry needling;Iontophoresis '4mg'$ /ml Dexamethasone    PT Next Visit Plan  aquatic therapy, LE strengthening, modalities PRN    Consulted and Agree with Plan of Care  Patient       Patient will benefit from skilled therapeutic intervention in order to improve the following deficits and impairments:  Pain,  Abnormal gait, Decreased balance, Decreased range of motion, Decreased strength, Difficulty walking  Visit Diagnosis: Muscle weakness (generalized)  Difficulty in walking, not elsewhere classified     Problem List Patient Active Problem List   Diagnosis Date Noted  . Weakness 11/03/2012    [name] PT, DPT   11/30/2017, 3:34 PM  [place] [facility] [address], [place], [address] Phone: [phone]   Fax:  [phone]  Name: [name] MRN: [id number] Date of Birth: 1945-12-21

## 2017-12-02 ENCOUNTER — Institutional Professional Consult (permissible substitution): Payer: PRIVATE HEALTH INSURANCE | Admitting: Neurology

## 2017-12-09 ENCOUNTER — Ambulatory Visit: Payer: Medicare Other

## 2017-12-14 ENCOUNTER — Ambulatory Visit: Payer: Medicare Other

## 2017-12-16 ENCOUNTER — Other Ambulatory Visit: Payer: Self-pay

## 2017-12-16 ENCOUNTER — Ambulatory Visit: Payer: Medicare Other

## 2017-12-16 DIAGNOSIS — R262 Difficulty in walking, not elsewhere classified: Secondary | ICD-10-CM

## 2017-12-16 DIAGNOSIS — R269 Unspecified abnormalities of gait and mobility: Secondary | ICD-10-CM | POA: Diagnosis not present

## 2017-12-16 DIAGNOSIS — R2681 Unsteadiness on feet: Secondary | ICD-10-CM

## 2017-12-16 DIAGNOSIS — M6281 Muscle weakness (generalized): Secondary | ICD-10-CM

## 2017-12-16 DIAGNOSIS — M5386 Other specified dorsopathies, lumbar region: Secondary | ICD-10-CM

## 2017-12-16 DIAGNOSIS — M2560 Stiffness of unspecified joint, not elsewhere classified: Secondary | ICD-10-CM

## 2017-12-16 DIAGNOSIS — R2689 Other abnormalities of gait and mobility: Secondary | ICD-10-CM | POA: Diagnosis not present

## 2017-12-16 DIAGNOSIS — R29898 Other symptoms and signs involving the musculoskeletal system: Secondary | ICD-10-CM

## 2017-12-16 NOTE — Therapy (Signed)
[place] MAIN [facility] SERVICES [address] [place], [place], [address] Phone: [phone]   Fax:  [phone]  Physical Therapy Treatment  Patient Details  Name: [name] MRN: [id number] Date of Birth: January 11, 1946 Referring Provider: Shary Decamp, [place]   Encounter Date: 12/16/2017  PT End of Session - 12/16/17 1156    Visit Number  42    Number of Visits  125    Date for PT Re-Evaluation  12/30/17    Authorization Type  6    Authorization Time Period  of 10 progress report    PT Start Time  0945    PT Stop Time  1040    PT Time Calculation (min)  55 min    Activity Tolerance  Patient tolerated treatment well    Behavior During Therapy  [facility] for tasks assessed/performed       Past Medical History:  Diagnosis Date  . Arthritis   . Depression     Past Surgical History:  Procedure Laterality Date  . ABDOMINAL HYSTERECTOMY  1995  . back sugery    . BUNIONECTOMY  2013   rt foot  . COLONOSCOPY    . MUSCLE BIOPSY Left 10/03/2012   Procedure: LEFT QUADRICEP MUSCLE BIOPSY;  Surgeon: Odis Hollingshead, MD;  Location: [place];  Service: General;  Laterality: Left;  . NECK SURGERY  2010   cerv disc fused     There were no vitals filed for this visit.  Subjective Assessment - 12/16/17 1153    Subjective  Pt denies any significant pains today; No change in RLE weakness. Continues with RLE drag and foot drop, but notes she has not been wearing her brace. Pt feels she needs to be more diligent about wearing    Pertinent History  LE weakness.  Symptoms occured suddenly, unknown method of injury prior to her first neck fusion surgery on January 2010. Had lower back surgery fusion in 2011.  The neck and back surgeries did not help. Pt states having increased urinary urgency and takes medication for for it. MD aware.  Denies saddle anesthesia.  Pt states that her doctor told her that PT is the only thing that is going to help  her keep moving so she continues to participate in PT.  Last round of PT was last year which helped.  Currently has difficulty walking, performing chores (wash dishes, laundry), cooking. Better able to do her tasks a little bit when she does therapy but gets harder when she stops.  Feels burning and stinging in both her knees, and bilateral anterior and lateral legs.  Pt states not having back or neck pain. Just a stiff neck.  Pt states she usually walks with a cane on her R side. No falls within the last 6 months.        Enters/exits via ramp  Ambulation, warm up  4 L fwd  4 L side  4 L side with squat  Core with LE strength, at rail  Hip abd/add, B, 30x  Hip flex/ext, B, 30x  Squats with 1 blue dumbbell, 30x  Core with UE strength  Mitts, 30x ea   Sh abd/add   Sh flex/ext   Sh horiz abd/add  Resisted ambulation  Mitts   Fwd with drag, thumb up, 4 L   Side, elbows fwd flex 90 deg, thumb up   High knee, 2 L thumb up resist  Active stretching, B 3 x each  Hamstrings/gastroc and hip flexor quad  Hip flexor at step                                  PT Long Term Goals - 11/02/17 1412      PT LONG TERM GOAL #1   Title  Pt will be independent with her HEP to promote LE strength in function.    Time  8    Period  Weeks    Status  On-going    Target Date  12/30/17      PT LONG TERM GOAL #2   Title  Patient will improve bilateral LE strength by at least 1/2 MMT grade to promote ability to ambulate and perform functional tasks.     Time  8    Period  Weeks    Status  Achieved      PT LONG TERM GOAL #3   Title  Patient will improve her LEFS score by at least 9 points as a demonstration of improved function.     Baseline  14/80 (07/29/2016); 18/80 (10/29/2016); 31/80 (12/09/2016); 18/80, pt however states increased bilateral knee pain recently (01/14/2017); 20/80 (04/12/17); 20/80 (05/20/2017); 26/80 (07/15/2017); 19/80 (11/02/2017)    Time  8    Period   Weeks    Status  Partially Met    Target Date  12/30/17      PT LONG TERM GOAL #4   Title  Pt will report being able to walk/stand with SPC over 35 min to promote mobility.     Baseline  Pt states being able to walk for about 30 min (07/29/2016); about 1 hour per pt reports (09/07/2016)    Time  8    Period  Weeks    Status  Achieved      PT LONG TERM GOAL #5   Title  Patient will have a decrease in bilateral LE pain to 5/10 or less at worst to promote ability to perform functional tasks.     Baseline  8/10 at worst (07/29/2016); Minimal burning sensation bilateral knee and legs, no pain level number provided. Pt also states feeling pain in her knees during the weekend. No pain level provided (09/07/2016); 3/10 L leg burning sensation at most for the past 7 days (09/28/2016); bilateral knee burning sensation 8/10 at worst (10/29/2016); 7-8/10 bilateral knee pain, burning comes and goes (12/09/2016); 7-8/10 R knee, 5/10 L knee pain at most for the past 7 days (01/14/2017); 6-7/10 burning in L lower leg (04/12/17); 1-2/10 (05/18/2017); 5/10  L knee and leg burning at most , 0/10 R knee and leg burning at most for the past 7 days (07/15/2017); No pain, just the burning mainly at the knees (09/02/2017); 0/10 R knee, 6/10  L leg at worst (11/02/2017)    Time  8    Period  Weeks    Status  Partially Met    Target Date  12/30/17      PT LONG TERM GOAL #6   Title  Pt will improve seated R hip flexion strength to at least 3+/5 to promote ability to ambulate and perform standing tasks.     Baseline  2+/5 seated R hip flexion (09/28/2016), (10/29/2016); (12/09/2016), (01/14/2017), (04/12/17); 2+/5 (05/20/2017), (07/15/2017), (09/02/2017), (11/02/2017)    Time  8    Period  Weeks    Status  On-going    Target Date  12/30/17      PT  LONG TERM GOAL #7   Title  Pt will improve her LEFS score to 40/80 or more as a demonstration of improved function.     Baseline  31/80 (12/09/2016); 18/80, pt however states increased bilateral  knee pain recently (01/14/2017); 20/80 (04/12/17), (05/20/2017); 26/80 (07/15/2016); 35/80 (09/02/2017); 19/80 (11/02/2017)    Time  8    Period  Weeks    Status  On-going    Target Date  12/30/17            Plan - 12/16/17 1157    Clinical Impression Statement  Requires cues for R DF with ambulation in water. Cues as well during core stabilization exercies. Tolerates gentle resisted walking and resisted squats as well. Continue to progress strength/endurance to improve LLE and overall function.     Rehab Potential  Fair    Clinical Impairments Affecting Rehab Potential  Chronicity of condition, weakness    PT Frequency  2x / week    PT Duration  8 weeks    PT Treatment/Interventions  Electrical Stimulation;Aquatic Therapy;Ultrasound;Gait training;Functional mobility training;Therapeutic activities;Therapeutic exercise;Balance training;Neuromuscular re-education;Patient/family education;Manual techniques;Dry needling;Iontophoresis '4mg'$ /ml Dexamethasone    PT Next Visit Plan  aquatic therapy, LE strengthening, modalities PRN    Consulted and Agree with Plan of Care  Patient       Patient will benefit from skilled therapeutic intervention in order to improve the following deficits and impairments:  Pain, Abnormal gait, Decreased balance, Decreased range of motion, Decreased strength, Difficulty walking  Visit Diagnosis: Difficulty in walking, not elsewhere classified  Muscle weakness (generalized)  Unsteadiness on feet  Other abnormalities of gait and mobility  Unsteadiness  Decreased ROM of lumbar spine  Abnormality of gait  Weakness of both legs  Joint stiffness of spine     Problem List Patient Active Problem List   Diagnosis Date Noted  . Weakness 11/03/2012    Larae Grooms 12/16/2017, 12:01 PM  [place] MAIN [facility] SERVICES [address] [place], [place], [address] Phone: [phone]   Fax:  [phone]  Name: [name] MRN: [id number] Date of Birth: Nov 23, 1945

## 2017-12-21 ENCOUNTER — Ambulatory Visit: Payer: Medicare Other

## 2017-12-23 ENCOUNTER — Ambulatory Visit: Payer: Medicare Other

## 2017-12-28 ENCOUNTER — Ambulatory Visit

## 2017-12-30 ENCOUNTER — Encounter

## 2018-01-04 DIAGNOSIS — M25461 Effusion, right knee: Secondary | ICD-10-CM | POA: Diagnosis not present

## 2018-01-04 DIAGNOSIS — M25561 Pain in right knee: Secondary | ICD-10-CM | POA: Diagnosis not present

## 2018-01-04 DIAGNOSIS — M1180 Other specified crystal arthropathies, unspecified site: Secondary | ICD-10-CM | POA: Diagnosis not present

## 2018-01-04 DIAGNOSIS — R768 Other specified abnormal immunological findings in serum: Secondary | ICD-10-CM | POA: Diagnosis not present

## 2018-01-04 DIAGNOSIS — M170 Bilateral primary osteoarthritis of knee: Secondary | ICD-10-CM | POA: Diagnosis not present

## 2018-01-10 DIAGNOSIS — M25461 Effusion, right knee: Secondary | ICD-10-CM | POA: Diagnosis not present

## 2018-01-10 DIAGNOSIS — M25561 Pain in right knee: Secondary | ICD-10-CM | POA: Diagnosis not present

## 2018-01-10 DIAGNOSIS — R768 Other specified abnormal immunological findings in serum: Secondary | ICD-10-CM | POA: Diagnosis not present

## 2018-01-10 DIAGNOSIS — M170 Bilateral primary osteoarthritis of knee: Secondary | ICD-10-CM | POA: Diagnosis not present

## 2018-01-10 DIAGNOSIS — M25562 Pain in left knee: Secondary | ICD-10-CM | POA: Diagnosis not present

## 2018-01-10 DIAGNOSIS — M1711 Unilateral primary osteoarthritis, right knee: Secondary | ICD-10-CM | POA: Diagnosis not present

## 2018-01-10 DIAGNOSIS — M1180 Other specified crystal arthropathies, unspecified site: Secondary | ICD-10-CM | POA: Diagnosis not present

## 2018-01-10 DIAGNOSIS — M1712 Unilateral primary osteoarthritis, left knee: Secondary | ICD-10-CM | POA: Diagnosis not present

## 2018-01-14 DIAGNOSIS — M71571 Other bursitis, not elsewhere classified, right ankle and foot: Secondary | ICD-10-CM | POA: Diagnosis not present

## 2018-01-14 DIAGNOSIS — M2041 Other hammer toe(s) (acquired), right foot: Secondary | ICD-10-CM | POA: Diagnosis not present

## 2018-01-19 ENCOUNTER — Ambulatory Visit: Payer: Medicare Other | Attending: Physician Assistant

## 2018-01-19 DIAGNOSIS — R262 Difficulty in walking, not elsewhere classified: Secondary | ICD-10-CM | POA: Insufficient documentation

## 2018-01-31 DIAGNOSIS — M24574 Contracture, right foot: Secondary | ICD-10-CM | POA: Diagnosis not present

## 2018-02-23 DIAGNOSIS — M12271 Villonodular synovitis (pigmented), right ankle and foot: Secondary | ICD-10-CM | POA: Diagnosis not present

## 2018-02-28 DIAGNOSIS — H11133 Conjunctival pigmentations, bilateral: Secondary | ICD-10-CM | POA: Diagnosis not present

## 2018-02-28 DIAGNOSIS — H05223 Edema of bilateral orbit: Secondary | ICD-10-CM | POA: Diagnosis not present

## 2018-02-28 DIAGNOSIS — H02413 Mechanical ptosis of bilateral eyelids: Secondary | ICD-10-CM | POA: Diagnosis not present

## 2018-03-21 ENCOUNTER — Ambulatory Visit: Payer: Medicare Other | Attending: Physician Assistant

## 2018-03-21 DIAGNOSIS — M25562 Pain in left knee: Secondary | ICD-10-CM | POA: Insufficient documentation

## 2018-03-21 DIAGNOSIS — M25561 Pain in right knee: Secondary | ICD-10-CM

## 2018-03-21 DIAGNOSIS — R262 Difficulty in walking, not elsewhere classified: Secondary | ICD-10-CM

## 2018-03-21 DIAGNOSIS — R2681 Unsteadiness on feet: Secondary | ICD-10-CM | POA: Diagnosis not present

## 2018-03-21 DIAGNOSIS — M6281 Muscle weakness (generalized): Secondary | ICD-10-CM | POA: Diagnosis not present

## 2018-03-21 NOTE — Therapy (Addendum)
[place] [facility] [address], [place], [address] Phone: [phone]   Fax:  [phone]  Physical Therapy Evaluation  Patient Details  Name: [name] MRN: [id number] Date of Birth: [birth year] Referring Provider (PT): Maryclare Labrador [place], [place]   Encounter Date: 03/21/2018  PT End of Session - 03/21/18 1251    Visit Number  1    Number of Visits  17    Date for PT Re-Evaluation  05/19/18    Authorization Type  1    Authorization Time Period  of 10 progress report    PT Start Time  1251    PT Stop Time  1341    PT Time Calculation (min)  50 min    Activity Tolerance  Patient tolerated treatment well    Behavior During Therapy  [facility] for tasks assessed/performed       Past Medical History:  Diagnosis Date  . Arthritis   . Depression     Past Surgical History:  Procedure Laterality Date  . ABDOMINAL HYSTERECTOMY  1995  . back sugery    . BUNIONECTOMY  2013   rt foot  . COLONOSCOPY    . MUSCLE BIOPSY Left 10/03/2012   Procedure: LEFT QUADRICEP MUSCLE BIOPSY;  Surgeon: Odis Hollingshead, MD;  Location: [place];  Service: General;  Laterality: Left;  . NECK SURGERY  2010   cerv disc fused     There were no vitals filed for this visit.   Subjective Assessment - 03/21/18 1256    Subjective  Pt states that her knees bother her. No back pain. No burning in her legs. Just pain in her knees. 9/10 R knee pain currently at worst at times for the past 2 months, 9/10 L knee pain currently and at worst for the past 2 months.  Walking is sometime a little harder due to the pain in her knees.  Does not wear her AFO all the time. Uses it if she has to walk a long distance.  Has been doing her HEP as best as she could, not at much as she should but she has been doing them.  No falls within the last 6 months.     Pertinent History  LE weakness.  Symptoms occured suddenly, unknown method of injury prior to  her first neck fusion surgery on January 2010. Had lower back surgery fusion in 2011.  The neck and back surgeries did not help. Pt states having increased urinary urgency and takes medication for for it. MD aware.  Denies saddle anesthesia.  Pt states that her doctor told her that PT is the only thing that is going to help her keep moving so she continues to participate in PT.  Last round of PT was last year which helped.  Currently has difficulty walking, performing chores (wash dishes, laundry), cooking. Better able to do her tasks a little bit when she does therapy but gets harder when she stops.  Feels burning and stinging in both her knees, and bilateral anterior and lateral legs.  Pt states not having back or neck pain. Just a stiff neck.  Pt states she usually walks with a cane on her R side. No falls within the last 6 months.      Patient Stated Goals  Be better able to walk, and get around better without the stinging and burning in her knees and legs.     Currently in Pain?  Yes  Pain Score  9     Pain Location  Knee    Pain Orientation  Right;Left    Pain Descriptors / Indicators  Aching   R knee > L   Pain Type  Chronic pain    Pain Onset  More than a month ago    Pain Frequency  Constant    Aggravating Factors   Walking, sit <> standing, getting into and out of a car, stair negotiation bother her knees.     Pain Relieving Factors  sitting and resting.          [facility] PT Assessment - 03/21/18 1302      Assessment   Medical Diagnosis  S/P lumbar fusion,m weakness of both lower extremities    Referring Provider (PT)  Luster Landsberg, PA    Onset Date/Surgical Date  03/10/18   Date PT referral signed. Chronic condition.    Prior Therapy  Patient participated in prior PT for LE strengthening with good results. Decreased strength when pt took a break from participating in PT.       Precautions   Precaution Comments  No known precautions      Restrictions   Other  Position/Activity Restrictions  No known restrictions      Balance Screen   Has the patient fallen in the past 6 months  No      Home Environment   Additional Comments  Pt lives in a 1 story home with her husband, 2 steps to enter, no rail       Prior Function   Vocation Requirements  PLOF: less difficulty walking, performing functional tasks at home.       Observation/Other Assessments   Observations  10 MWT: 26 seconds, 25 seconds with SPC on L side (0.39 m/s average)    Lower Extremity Functional Scale   27/80      AROM   Right Ankle Dorsiflexion  -21   -10 degrees AAROM, stiff end feel, pt able to hold position     Strength   Right Hip Flexion  2-/5    Right Hip Extension  4/5   seated manually resisted   Right Hip ABduction  4-/5   seated clamshell position, hips less than 90 degrees flexion   Left Hip Flexion  4-/5    Left Hip Extension  4/5   seated manually resisted   Left Hip ABduction  4/5   seated clamshell position, hips less than 90 degrees flexion   Right Knee Flexion  4-/5    Right Knee Extension  4+/5    Left Knee Flexion  4-/5    Left Knee Extension  5/5    Right Ankle Dorsiflexion  4/5   at available range; stiff end feel   Left Ankle Dorsiflexion  4+/5      Palpation   Palpation comment  Swelling anterior lateral knee joint R > L.  Slight stiffness posterior R ankle joint      Ambulation/Gait   Gait Comments  SPC on L side, decreased R LE swing phase, decreased R hip flexion, decreased L ankle DF                                PT Education - 03/21/18 1338    Education provided  Yes    Education Details  ther-ex, plan of care    Person(s) Educated  Patient    Methods  Explanation;Demonstration;Tactile  cues;Verbal cues    Comprehension  Returned demonstration;Verbalized understanding       PT Short Term Goals - 03/21/18 1936      PT SHORT TERM GOAL #1   Title  Patient will be independent with her HEP to improve strength  and function.     Time  3    Period  Weeks    Status  New    Target Date  04/14/18       Objectives  Pt not wearing R AFO.   MedBridge Access Code: [email or web address]   Manual therapy  Supine A to P to R ankle joint grade 3    Supine R ankle DF improved to -8     Therapeutic exercise   supine assisted R SKTC with PT 10x2  Improved exercise technique, movement at target joints, use of target muscles after mod verbal, visual, tactile cues.   Pt states R LE feels less stiff after session   Patient is a 72 year old female who returns to physical therapy due to hx of back surgery and B LE weakness after a 3 month hiatus from physical therapy. She currently presents with B LE weakness with overall decreased strength compared to when she was participating in physical therapy; B knee pain, altered gait pattern and posture, limited R ankle DF AROM, R ankle stiffness, and difficulty performing functional tasks such as walking due to B knee pain, weakness, and limited R ankle DF. Pt will benefit from skilled physical therapy services to address the aforementioned deficits.      PT Long Term Goals - 03/21/18 1937      PT LONG TERM GOAL #1   Title  Patient will improve B LE strength by at least 1/2 MMT grade to help decrease knee pain, improve ability to ambulate, perform standing tasks.     Time  8    Period  Weeks    Status  New    Target Date  05/19/18      PT LONG TERM GOAL #2   Title  Patient will improve her LEFS score by at least 10 points as a demonstration of improved function.     Baseline  27/80 (03/21/2018)    Time  8    Period  Weeks    Status  New    Target Date  05/19/18      PT LONG TERM GOAL #3   Title  Patient will improve her 10 MWT speed with her SPC to at least 0.5 m/s to promote better community ambulation.     Baseline  0.39 m/s with her SPC (03/21/2018)    Time  8    Period  Weeks    Status  New    Target Date  05/19/18      PT LONG TERM  GOAL #4   Title  Patient will have a decrease B knee pain to 4/10 or less at worst to promote ability to ambulate, perform standing tasks.     Baseline  9/10 B knee pain at most (03/21/2018)    Time  8    Period  Weeks    Status  New    Target Date  05/19/18             Plan - 03/21/18 1933    Clinical Impression Statement  Patient is a 72 year old female who returns to physical therapy due to hx of back surgery and B LE weakness after a 3 month hiatus  from physical therapy. She currently presents with B LE weakness with overall decreased strength compared to when she was participating in physical therapy; B knee pain, altered gait pattern and posture, limited R ankle DF AROM, R ankle stiffness, and difficulty performing functional tasks such as walking due to B knee pain, weakness, and limited R ankle DF. Pt will benefit from skilled physical therapy services to address the aforementioned deficits.     History and Personal Factors relevant to plan of care:  Chronicity of condition, weakness, difficulty walking, bilateral knee pain, altered gait pattern, hx of neck and back surgery    Clinical Presentation  Evolving    Clinical Presentation due to:  increased LE weakness since taking a break from physical therapy    Clinical Decision Making  Moderate    Rehab Potential  Fair    Clinical Impairments Affecting Rehab Potential  Chronicity of condition, weakness    PT Frequency  2x / week    PT Duration  8 weeks    PT Treatment/Interventions  Electrical Stimulation;Aquatic Therapy;Ultrasound;Gait training;Functional mobility training;Therapeutic activities;Therapeutic exercise;Balance training;Neuromuscular re-education;Patient/family education;Manual techniques;Dry needling;Iontophoresis 4mg /ml Dexamethasone    PT Next Visit Plan  aquatic therapy, manual techniques, LE strengthening, modalities PRN    Consulted and Agree with Plan of Care  Patient       Patient will benefit from skilled  therapeutic intervention in order to improve the following deficits and impairments:  Pain, Abnormal gait, Decreased balance, Decreased range of motion, Decreased strength, Difficulty walking  Visit Diagnosis: Difficulty in walking, not elsewhere classified - Plan: PT plan of care cert/re-cert  Muscle weakness (generalized) - Plan: PT plan of care cert/re-cert  Unsteadiness on feet - Plan: PT plan of care cert/re-cert  Left knee pain, unspecified chronicity - Plan: PT plan of care cert/re-cert  Right knee pain, unspecified chronicity - Plan: PT plan of care cert/re-cert     Problem List Patient Active Problem List   Diagnosis Date Noted  . Weakness 11/03/2012   Thank you for your referral.  [name] PT, DPT   03/21/2018, 8:01 PM  [place] [facility] [address], [place], [address] Phone: [phone]   Fax:  [phone]  Name: [name] MRN: [id number] Date of Birth: 1945/04/25

## 2018-03-30 ENCOUNTER — Ambulatory Visit: Payer: Medicare Other | Attending: Physician Assistant

## 2018-03-30 DIAGNOSIS — M2560 Stiffness of unspecified joint, not elsewhere classified: Secondary | ICD-10-CM | POA: Insufficient documentation

## 2018-03-30 DIAGNOSIS — M5386 Other specified dorsopathies, lumbar region: Secondary | ICD-10-CM | POA: Insufficient documentation

## 2018-03-30 DIAGNOSIS — M6281 Muscle weakness (generalized): Secondary | ICD-10-CM | POA: Insufficient documentation

## 2018-03-30 DIAGNOSIS — R2681 Unsteadiness on feet: Secondary | ICD-10-CM | POA: Diagnosis not present

## 2018-03-30 DIAGNOSIS — R29898 Other symptoms and signs involving the musculoskeletal system: Secondary | ICD-10-CM | POA: Insufficient documentation

## 2018-03-30 DIAGNOSIS — M79605 Pain in left leg: Secondary | ICD-10-CM | POA: Diagnosis not present

## 2018-03-30 DIAGNOSIS — M79604 Pain in right leg: Secondary | ICD-10-CM | POA: Diagnosis not present

## 2018-03-30 DIAGNOSIS — L700 Acne vulgaris: Secondary | ICD-10-CM | POA: Diagnosis not present

## 2018-03-30 DIAGNOSIS — L2084 Intrinsic (allergic) eczema: Secondary | ICD-10-CM | POA: Diagnosis not present

## 2018-03-30 DIAGNOSIS — M25562 Pain in left knee: Secondary | ICD-10-CM | POA: Insufficient documentation

## 2018-03-30 DIAGNOSIS — M25561 Pain in right knee: Secondary | ICD-10-CM | POA: Insufficient documentation

## 2018-03-30 DIAGNOSIS — M545 Low back pain: Secondary | ICD-10-CM | POA: Diagnosis not present

## 2018-03-30 DIAGNOSIS — R208 Other disturbances of skin sensation: Secondary | ICD-10-CM | POA: Diagnosis not present

## 2018-03-30 DIAGNOSIS — R2689 Other abnormalities of gait and mobility: Secondary | ICD-10-CM | POA: Insufficient documentation

## 2018-03-30 DIAGNOSIS — R269 Unspecified abnormalities of gait and mobility: Secondary | ICD-10-CM | POA: Insufficient documentation

## 2018-03-30 DIAGNOSIS — L811 Chloasma: Secondary | ICD-10-CM | POA: Diagnosis not present

## 2018-03-30 DIAGNOSIS — R262 Difficulty in walking, not elsewhere classified: Secondary | ICD-10-CM | POA: Diagnosis not present

## 2018-03-30 NOTE — Therapy (Signed)
[place] [address], [place], [address] Phone: [phone]   Fax:  [phone]  Physical Therapy Treatment  Patient Details  Name: [name] MRN: [id number] Date of Birth: Jun 16, 1945 Referring Provider (PT): Maryclare Labrador [place], [place]   Encounter Date: 03/30/2018  PT End of Session - 03/30/18 0803    Visit Number  2    Number of Visits  17    Date for PT Re-Evaluation  05/19/18    Authorization Type  2    Authorization Time Period  of 10 progress report    PT Start Time  0803    PT Stop Time  0847    PT Time Calculation (min)  44 min    Activity Tolerance  Patient tolerated treatment well    Behavior During Therapy  [facility] for tasks assessed/performed       Past Medical History:  Diagnosis Date  . Arthritis   . Depression     Past Surgical History:  Procedure Laterality Date  . ABDOMINAL HYSTERECTOMY  1995  . back sugery    . BUNIONECTOMY  2013   rt foot  . COLONOSCOPY    . MUSCLE BIOPSY Left 10/03/2012   Procedure: LEFT QUADRICEP MUSCLE BIOPSY;  Surgeon: Odis Hollingshead, MD;  Location: [place];  Service: General;  Laterality: Left;  . NECK SURGERY  2010   cerv disc fused     There were no vitals filed for this visit.  Subjective Assessment - 03/30/18 0805    Subjective  Knees are a little stiff as usual. The ankle felt better after last session. Was not as stiff. Every time she walks stairs, the stairs bother her R ankle. Walked stairs this past weekend.  Was not swollen after the manual therapy last week. Just swollen because of stair negotiation.  Pt states that her knees do not burn as bad as it used to.     Pertinent History  LE weakness.  Symptoms occured suddenly, unknown method of injury prior to her first neck fusion surgery on January 2010. Had lower back surgery fusion in 2011.  The neck and back surgeries did not help. Pt states having increased urinary urgency and  takes medication for for it. MD aware.  Denies saddle anesthesia.  Pt states that her doctor told her that PT is the only thing that is going to help her keep moving so she continues to participate in PT.  Last round of PT was last year which helped.  Currently has difficulty walking, performing chores (wash dishes, laundry), cooking. Better able to do her tasks a little bit when she does therapy but gets harder when she stops.  Feels burning and stinging in both her knees, and bilateral anterior and lateral legs.  Pt states not having back or neck pain. Just a stiff neck.  Pt states she usually walks with a cane on her R side. No falls within the last 6 months.      Patient Stated Goals  Be better able to walk, and get around better without the stinging and burning in her knees and legs.     Currently in Pain?  No/denies    Pain Score  0-No pain   just a little burning in her knees   Pain Onset  More than a month ago  PT Education - 03/30/18 0830    Education provided  Yes    Education Details  ther-ex    [employer]) Educated  Patient    Methods  Explanation;Demonstration;Tactile cues;Verbal cues    Comprehension  Returned demonstration;Verbalized understanding      Objectives  Pt notwearing R AFO.   MedBridge Access Code: [email or web address]    Manual therapy   Supine gentle R ankle distraction to decrease pressure.  Then with manual gastroc stretch  Seated STM R lateral hamstrings to help decrease tension and decrease ER of R tibia.   Pt states it felt good.   Therapeutic exercise   Supine R ankle AAROM DF with 2 second holds at end range, 10x2  Pt able to perform DF AROM close to end range.  supine assisted R SKTC with PT 10x3  Pt states exercise feels good for R knee  Improved R hip flexor muscle activation with repetition  Supine clamshell blue band 10x2  Then with blue and yellow bands 10x2  Supine marches 10x  each LE  Seated assisted R hip flexion with PT assist 5x3  Gait with emphasis on R ankle DF using SPC on L side. Improved R foot clearance during swing phase.   Improved exercise technique, movement at target joints, use of target muscles after mod verbal, visual, tactile cues.   Pt able to perform supine R ankle DF close to end range AROM. Improved R foot clearance during gait with cues for R ankle DF. Improved ability to activate R hip flexor muscles with supine R SKTC with increased repetition. Pt tolerated session well without aggravation of symptoms. Pt will benefit from continued skilled physical therapy services to improve strength, function, and ability to ambulate.       PT Short Term Goals - 03/21/18 1936      PT SHORT TERM GOAL #1   Title  Patient will be independent with her HEP to improve strength and function.     Time  3    Period  Weeks    Status  New    Target Date  04/14/18        PT Long Term Goals - 03/21/18 1937      PT LONG TERM GOAL #1   Title  Patient will improve B LE strength by at least 1/2 MMT grade to help decrease knee pain, improve ability to ambulate, perform standing tasks.     Time  8    Period  Weeks    Status  New    Target Date  05/19/18      PT LONG TERM GOAL #2   Title  Patient will improve her LEFS score by at least 10 points as a demonstration of improved function.     Baseline  27/80 (03/21/2018)    Time  8    Period  Weeks    Status  New    Target Date  05/19/18      PT LONG TERM GOAL #3   Title  Patient will improve her 10 MWT speed with her SPC to at least 0.5 m/s to promote better community ambulation.     Baseline  0.39 m/s with her SPC (03/21/2018)    Time  8    Period  Weeks    Status  New    Target Date  05/19/18      PT LONG TERM GOAL #4   Title  Patient will have a decrease B knee pain to 4/10 or less  at worst to promote ability to ambulate, perform standing tasks.     Baseline  9/10 B knee pain at most  (03/21/2018)    Time  8    Period  Weeks    Status  New    Target Date  05/19/18            Plan - 03/30/18 0802    Clinical Impression Statement  Pt able to perform supine R ankle DF close to end range AROM. Improved R foot clearance during gait with cues for R ankle DF. Improved ability to activate R hip flexor muscles with supine R SKTC with increased repetition. Pt tolerated session well without aggravation of symptoms. Pt will benefit from continued skilled physical therapy services to improve strength, function, and ability to ambulate.     Rehab Potential  Fair    Clinical Impairments Affecting Rehab Potential  Chronicity of condition, weakness    PT Frequency  2x / week    PT Duration  8 weeks    PT Treatment/Interventions  Electrical Stimulation;Aquatic Therapy;Ultrasound;Gait training;Functional mobility training;Therapeutic activities;Therapeutic exercise;Balance training;Neuromuscular re-education;Patient/family education;Manual techniques;Dry needling;Iontophoresis 4mg /ml Dexamethasone    PT Next Visit Plan  aquatic therapy, manual techniques, LE strengthening, modalities PRN    Consulted and Agree with Plan of Care  Patient       Patient will benefit from skilled therapeutic intervention in order to improve the following deficits and impairments:  Pain, Abnormal gait, Decreased balance, Decreased range of motion, Decreased strength, Difficulty walking  Visit Diagnosis: Difficulty in walking, not elsewhere classified  Muscle weakness (generalized)  Unsteadiness on feet  Left knee pain, unspecified chronicity  Right knee pain, unspecified chronicity     Problem List Patient Active Problem List   Diagnosis Date Noted  . Weakness 11/03/2012    [name] PT, DPT   03/30/2018, 9:30 AM  [place] [facility] [address], [place], [address] Phone: [phone]   Fax:  [phone]  Name:  [name] MRN: [id number] Date of Birth: [birth year]

## 2018-03-31 ENCOUNTER — Ambulatory Visit: Payer: Medicare Other

## 2018-03-31 ENCOUNTER — Other Ambulatory Visit: Payer: Self-pay

## 2018-03-31 DIAGNOSIS — R2681 Unsteadiness on feet: Secondary | ICD-10-CM

## 2018-03-31 DIAGNOSIS — M6281 Muscle weakness (generalized): Secondary | ICD-10-CM

## 2018-03-31 DIAGNOSIS — M2560 Stiffness of unspecified joint, not elsewhere classified: Secondary | ICD-10-CM

## 2018-03-31 DIAGNOSIS — R29898 Other symptoms and signs involving the musculoskeletal system: Secondary | ICD-10-CM

## 2018-03-31 DIAGNOSIS — M5386 Other specified dorsopathies, lumbar region: Secondary | ICD-10-CM

## 2018-03-31 DIAGNOSIS — R262 Difficulty in walking, not elsewhere classified: Secondary | ICD-10-CM

## 2018-03-31 DIAGNOSIS — R269 Unspecified abnormalities of gait and mobility: Secondary | ICD-10-CM

## 2018-03-31 DIAGNOSIS — R2689 Other abnormalities of gait and mobility: Secondary | ICD-10-CM

## 2018-03-31 DIAGNOSIS — M25561 Pain in right knee: Secondary | ICD-10-CM | POA: Diagnosis not present

## 2018-03-31 DIAGNOSIS — R208 Other disturbances of skin sensation: Secondary | ICD-10-CM

## 2018-03-31 DIAGNOSIS — M25562 Pain in left knee: Secondary | ICD-10-CM | POA: Diagnosis not present

## 2018-03-31 NOTE — Therapy (Signed)
[place] MAIN [facility] SERVICES [address] [place], [place], [address] Phone: [phone]   Fax:  [phone]  Physical Therapy Treatment  Patient Details  Name: MARLI DIEGO MRN: [id number] Date of Birth: 1945-04-21 Referring Provider (PT): Maryclare Labrador [place], [place]   Encounter Date: 03/31/2018  PT End of Session - 03/31/18 1524    Visit Number  3    Number of Visits  17    Authorization Type  3    Authorization Time Period  of 10 progress report    PT Start Time  1335    PT Stop Time  1415    PT Time Calculation (min)  40 min    Activity Tolerance  Patient tolerated treatment well    Behavior During Therapy  [facility] for tasks assessed/performed       Past Medical History:  Diagnosis Date  . Arthritis   . Depression     Past Surgical History:  Procedure Laterality Date  . ABDOMINAL HYSTERECTOMY  1995  . back sugery    . BUNIONECTOMY  2013   rt foot  . COLONOSCOPY    . MUSCLE BIOPSY Left 10/03/2012   Procedure: LEFT QUADRICEP MUSCLE BIOPSY;  Surgeon: Odis Hollingshead, MD;  Location: [place];  Service: General;  Laterality: Left;  . NECK SURGERY  2010   cerv disc fused     There were no vitals filed for this visit.  Subjective Assessment - 03/31/18 1517    Subjective  Pt well known to aquatic PT and returns after several months. Pt notes she does not feel she manages her home/community activities as well without aquatic PT. Pt notes she has seen her MD and is not willing to trial surgery to her back again for possible symptom relief. Pt feels a conservative approach at this stage in her life is best. Pt notes little to no pain in back/LEs, but continues with burning sensation, although improved, from knees to feet.     Pertinent History  LE weakness.  Symptoms occured suddenly, unknown method of injury prior to her first neck fusion surgery on January 2010. Had lower back surgery fusion in 2011.  The neck and back  surgeries did not help. Pt states having increased urinary urgency and takes medication for for it. MD aware.  Denies saddle anesthesia.  Pt states that her doctor told her that PT is the only thing that is going to help her keep moving so she continues to participate in PT.  Last round of PT was last year which helped.  Currently has difficulty walking, performing chores (wash dishes, laundry), cooking. Better able to do her tasks a little bit when she does therapy but gets harder when she stops.  Feels burning and stinging in both her knees, and bilateral anterior and lateral legs.  Pt states not having back or neck pain. Just a stiff neck.  Pt states she usually walks with a cane on her R side. No falls within the last 6 months.        Enters/exits via ramp   Walking warm up  Fwd 4 L  Side 4 L  Core with LE, 20x ea  Squat  Hip abd/add  Hip flex/ext  Core with UE, 20x ea  Red dumbbells   Triceps press downs   Sh abd/add   Sh flex/ext   Sh horiz abd/add  Active stretching, 4x ea  Ham/gastroc and quad hip/flexor  LB center, R,  center, L  Independent walking post session                        PT Education - 03/31/18 1523    Education provided  Yes    Education Details  Re education on H20 properties and benefits to exercise, core stabilization with LE/UE work, active stretching.     Person(s) Educated  Patient    Methods  Explanation;Demonstration;Tactile cues;Verbal cues    Comprehension  Verbalized understanding;Returned demonstration;Verbal cues required;Tactile cues required       PT Short Term Goals - 03/21/18 1936      PT SHORT TERM GOAL #1   Title  Patient will be independent with her HEP to improve strength and function.     Time  3    Period  Weeks    Status  New    Target Date  04/14/18        PT Long Term Goals - 03/21/18 1937      PT LONG TERM GOAL #1   Title  Patient will improve B LE strength by at least 1/2 MMT grade to help  decrease knee pain, improve ability to ambulate, perform standing tasks.     Time  8    Period  Weeks    Status  New    Target Date  05/19/18      PT LONG TERM GOAL #2   Title  Patient will improve her LEFS score by at least 10 points as a demonstration of improved function.     Baseline  27/80 (03/21/2018)    Time  8    Period  Weeks    Status  New    Target Date  05/19/18      PT LONG TERM GOAL #3   Title  Patient will improve her 10 MWT speed with her SPC to at least 0.5 m/s to promote better community ambulation.     Baseline  0.39 m/s with her SPC (03/21/2018)    Time  8    Period  Weeks    Status  New    Target Date  05/19/18      PT LONG TERM GOAL #4   Title  Patient will have a decrease B knee pain to 4/10 or less at worst to promote ability to ambulate, perform standing tasks.     Baseline  9/10 B knee pain at most (03/21/2018)    Time  8    Period  Weeks    Status  New    Target Date  05/19/18            Plan - 03/31/18 1525    Clinical Impression Statement  Pt tolerated session well; required re education of all exercise/activity for form, technique and posture with continued cues required throughout as well. Continue to progress as tolerated and as able based on correct ability of exercise.     Rehab Potential  Fair    Clinical Impairments Affecting Rehab Potential  Chronicity of condition, weakness    PT Frequency  2x / week    PT Duration  8 weeks    PT Treatment/Interventions  Electrical Stimulation;Aquatic Therapy;Ultrasound;Gait training;Functional mobility training;Therapeutic activities;Therapeutic exercise;Balance training;Neuromuscular re-education;Patient/family education;Manual techniques;Dry needling;Iontophoresis 4mg /ml Dexamethasone    PT Next Visit Plan  aquatic therapy, manual techniques, LE strengthening, modalities PRN    Consulted and Agree with Plan of Care  Patient       Patient will benefit from  skilled therapeutic intervention in  order to improve the following deficits and impairments:  Pain, Abnormal gait, Decreased balance, Decreased range of motion, Decreased strength, Difficulty walking  Visit Diagnosis: Difficulty in walking, not elsewhere classified  Unsteadiness  Muscle weakness (generalized)  Decreased ROM of lumbar spine  Unsteadiness on feet  Abnormality of gait  Other disturbances of skin sensation  Weakness of both legs  Joint stiffness of spine  Other abnormalities of gait and mobility     Problem List Patient Active Problem List   Diagnosis Date Noted  . Weakness 11/03/2012    Larae Grooms 03/31/2018, 3:28 PM  [place] MAIN [facility] SERVICES [address] [place], [place], [address] Phone: [phone]   Fax:  [phone]  Name: BENNETT VANSCYOC MRN: [id number] Date of Birth: 20-May-1945

## 2018-04-05 ENCOUNTER — Ambulatory Visit: Payer: Medicare Other

## 2018-04-06 DIAGNOSIS — M1180 Other specified crystal arthropathies, unspecified site: Secondary | ICD-10-CM | POA: Diagnosis not present

## 2018-04-06 DIAGNOSIS — M25462 Effusion, left knee: Secondary | ICD-10-CM | POA: Diagnosis not present

## 2018-04-06 DIAGNOSIS — M25562 Pain in left knee: Secondary | ICD-10-CM | POA: Diagnosis not present

## 2018-04-06 DIAGNOSIS — M170 Bilateral primary osteoarthritis of knee: Secondary | ICD-10-CM | POA: Diagnosis not present

## 2018-04-06 DIAGNOSIS — R768 Other specified abnormal immunological findings in serum: Secondary | ICD-10-CM | POA: Diagnosis not present

## 2018-04-06 DIAGNOSIS — M25561 Pain in right knee: Secondary | ICD-10-CM | POA: Diagnosis not present

## 2018-04-07 ENCOUNTER — Ambulatory Visit: Payer: Medicare Other

## 2018-04-07 DIAGNOSIS — M25562 Pain in left knee: Secondary | ICD-10-CM | POA: Diagnosis not present

## 2018-04-07 DIAGNOSIS — M5386 Other specified dorsopathies, lumbar region: Secondary | ICD-10-CM | POA: Diagnosis not present

## 2018-04-07 DIAGNOSIS — M6281 Muscle weakness (generalized): Secondary | ICD-10-CM | POA: Diagnosis not present

## 2018-04-07 DIAGNOSIS — R262 Difficulty in walking, not elsewhere classified: Secondary | ICD-10-CM | POA: Diagnosis not present

## 2018-04-07 DIAGNOSIS — R2681 Unsteadiness on feet: Secondary | ICD-10-CM | POA: Diagnosis not present

## 2018-04-07 DIAGNOSIS — M25561 Pain in right knee: Secondary | ICD-10-CM | POA: Diagnosis not present

## 2018-04-07 NOTE — Therapy (Signed)
[place] MAIN [facility] SERVICES [address] [place], [place], [address] Phone: [phone]   Fax:  [phone]  Physical Therapy Treatment  Patient Details  Name: [name] MRN: [id number] Date of Birth: 1946-02-26 Referring Provider (PT): Maryclare Labrador [place], [place]   Encounter Date: 04/07/2018  PT End of Session - 04/07/18 1610    Visit Number  4    Number of Visits  17    Date for PT Re-Evaluation  05/19/18    Authorization Type  4    Authorization Time Period  of 10 progress report    PT Start Time  1315    PT Stop Time  1347    PT Time Calculation (min)  32 min    Activity Tolerance  Patient tolerated treatment well;Patient limited by fatigue       Past Medical History:  Diagnosis Date  . Arthritis   . Depression     Past Surgical History:  Procedure Laterality Date  . ABDOMINAL HYSTERECTOMY  1995  . back sugery    . BUNIONECTOMY  2013   rt foot  . COLONOSCOPY    . MUSCLE BIOPSY Left 10/03/2012   Procedure: LEFT QUADRICEP MUSCLE BIOPSY;  Surgeon: Odis Hollingshead, MD;  Location: [place];  Service: General;  Laterality: Left;  . NECK SURGERY  2010   cerv disc fused     There were no vitals filed for this visit.  Subjective Assessment - 04/07/18 1608    Subjective  Pt well known to aquatic PT and returns after several months. Pt notes she does not feel she manages her home/community activities as well without aquatic PT. Pt notes she has seen her MD and is not willing to trial surgery to her back again for possible symptom relief. Pt feels a conservative approach at this stage in her life is best. Pt notes little to no pain in back/LEs, but continues with burning sensation, although improved, from knees to feet.     Pertinent History  LE weakness.  Symptoms occured suddenly, unknown method of injury prior to her first neck fusion surgery on January 2010. Had lower back surgery fusion in 2011.  The neck and back  surgeries did not help. Pt states having increased urinary urgency and takes medication for for it. MD aware.  Denies saddle anesthesia.  Pt states that her doctor told her that PT is the only thing that is going to help her keep moving so she continues to participate in PT.  Last round of PT was last year which helped.  Currently has difficulty walking, performing chores (wash dishes, laundry), cooking. Better able to do her tasks a little bit when she does therapy but gets harder when she stops.  Feels burning and stinging in both her knees, and bilateral anterior and lateral legs.  Pt states not having back or neck pain. Just a stiff neck.  Pt states she usually walks with a cane on her R side. No falls within the last 6 months.      Patient Stated Goals  Be better able to walk, and get around better without the stinging and burning in her knees and legs.     Pain Score  6     Pain Location  Back    Pain Orientation  Mid;Left    Pain Descriptors / Indicators  Aching;Sore    Pain Type  Chronic pain    Pain Onset  More than a  month ago    Pain Frequency  Constant    Multiple Pain Sites  Yes    Pain Location  Knee    Pain Orientation  Left    Pain Descriptors / Indicators  Aching;Tingling    Pain Type  Chronic pain    Pain Onset  More than a month ago    Pain Frequency  Constant       Aquatic Therapy:  Enters/exits via ramp   Walking warm up  Fwd 4 L  Side 4 L  Core with LE, 20x ea  Squat  Hip abd/add  Hip flex/ext  Core with UE, 20x ea  Red dumbbells   Triceps press downs   Sh abd/add   Sh flex/ext   Sh horiz abd/add  Active stretching, 4x ea  Ham/gastroc and quad hip/flexor  LB center, R, center, L  Sitting on noodle with hands on rail for support Ant/post pelvic tilts R/L pelvic tilts Pelvic clocks clockwise and counterclockwise  Independent walking post session    PT Education - 04/07/18 1609    Education provided  Yes    Education Details  Exercise  techniques    Person(s) Educated  Patient    Methods  Explanation;Demonstration;Tactile cues;Verbal cues    Comprehension  Verbalized understanding;Returned demonstration       PT Short Term Goals - 03/21/18 1936      PT SHORT TERM GOAL #1   Title  Patient will be independent with her HEP to improve strength and function.     Time  3    Period  Weeks    Status  New    Target Date  04/14/18        PT Long Term Goals - 03/21/18 1937      PT LONG TERM GOAL #1   Title  Patient will improve B LE strength by at least 1/2 MMT grade to help decrease knee pain, improve ability to ambulate, perform standing tasks.     Time  8    Period  Weeks    Status  New    Target Date  05/19/18      PT LONG TERM GOAL #2   Title  Patient will improve her LEFS score by at least 10 points as a demonstration of improved function.     Baseline  27/80 (03/21/2018)    Time  8    Period  Weeks    Status  New    Target Date  05/19/18      PT LONG TERM GOAL #3   Title  Patient will improve her 10 MWT speed with her SPC to at least 0.5 m/s to promote better community ambulation.     Baseline  0.39 m/s with her SPC (03/21/2018)    Time  8    Period  Weeks    Status  New    Target Date  05/19/18      PT LONG TERM GOAL #4   Title  Patient will have a decrease B knee pain to 4/10 or less at worst to promote ability to ambulate, perform standing tasks.     Baseline  9/10 B knee pain at most (03/21/2018)    Time  8    Period  Weeks    Status  New    Target Date  05/19/18            Plan - 04/07/18 1611    Clinical Impression Statement  Tolerated session well.  Pt  fatigued after session but stated she was "OK."    Clinical Decision Making  Moderate    Rehab Potential  Fair    Clinical Impairments Affecting Rehab Potential  Chronicity of condition, weakness    PT Frequency  2x / week    PT Duration  8 weeks    PT Treatment/Interventions  Electrical Stimulation;Aquatic Therapy;Ultrasound;Gait  training;Functional mobility training;Therapeutic activities;Therapeutic exercise;Balance training;Neuromuscular re-education;Patient/family education;Manual techniques;Dry needling;Iontophoresis 4mg /ml Dexamethasone    PT Next Visit Plan  aquatic therapy, manual techniques, LE strengthening, modalities PRN    Consulted and Agree with Plan of Care  Patient       Patient will benefit from skilled therapeutic intervention in order to improve the following deficits and impairments:  Pain, Abnormal gait, Decreased balance, Decreased range of motion, Decreased strength, Difficulty walking  Visit Diagnosis: Difficulty in walking, not elsewhere classified  Unsteadiness  Muscle weakness (generalized)  Decreased ROM of lumbar spine  Unsteadiness on feet  Abnormality of gait     Problem List Patient Active Problem List   Diagnosis Date Noted  . Weakness 11/03/2012    Chesley Noon, PTA 04/07/18, 4:14 PM  [place] MAIN [facility] SERVICES [address] [place], [place], [address] Phone: [phone]   Fax:  [phone]  Name: [name] MRN: [id number] Date of Birth: 29-May-1945

## 2018-04-12 ENCOUNTER — Ambulatory Visit: Payer: Medicare Other

## 2018-04-12 DIAGNOSIS — H04223 Epiphora due to insufficient drainage, bilateral lacrimal glands: Secondary | ICD-10-CM | POA: Diagnosis not present

## 2018-04-12 DIAGNOSIS — H02832 Dermatochalasis of right lower eyelid: Secondary | ICD-10-CM | POA: Diagnosis not present

## 2018-04-12 DIAGNOSIS — H02835 Dermatochalasis of left lower eyelid: Secondary | ICD-10-CM | POA: Diagnosis not present

## 2018-04-12 DIAGNOSIS — H02423 Myogenic ptosis of bilateral eyelids: Secondary | ICD-10-CM | POA: Diagnosis not present

## 2018-04-12 DIAGNOSIS — H02132 Senile ectropion of right lower eyelid: Secondary | ICD-10-CM | POA: Diagnosis not present

## 2018-04-12 DIAGNOSIS — R238 Other skin changes: Secondary | ICD-10-CM | POA: Diagnosis not present

## 2018-04-12 DIAGNOSIS — H02831 Dermatochalasis of right upper eyelid: Secondary | ICD-10-CM | POA: Diagnosis not present

## 2018-04-12 DIAGNOSIS — H0289 Other specified disorders of eyelid: Secondary | ICD-10-CM | POA: Diagnosis not present

## 2018-04-12 DIAGNOSIS — H02834 Dermatochalasis of left upper eyelid: Secondary | ICD-10-CM | POA: Diagnosis not present

## 2018-04-12 DIAGNOSIS — H02135 Senile ectropion of left lower eyelid: Secondary | ICD-10-CM | POA: Diagnosis not present

## 2018-04-14 ENCOUNTER — Ambulatory Visit: Payer: Medicare Other

## 2018-04-19 ENCOUNTER — Other Ambulatory Visit: Payer: Self-pay

## 2018-04-19 ENCOUNTER — Ambulatory Visit: Payer: Medicare Other

## 2018-04-19 DIAGNOSIS — R2689 Other abnormalities of gait and mobility: Secondary | ICD-10-CM

## 2018-04-19 DIAGNOSIS — M545 Low back pain, unspecified: Secondary | ICD-10-CM

## 2018-04-19 DIAGNOSIS — M25562 Pain in left knee: Secondary | ICD-10-CM | POA: Diagnosis not present

## 2018-04-19 DIAGNOSIS — R2681 Unsteadiness on feet: Secondary | ICD-10-CM

## 2018-04-19 DIAGNOSIS — M6281 Muscle weakness (generalized): Secondary | ICD-10-CM | POA: Diagnosis not present

## 2018-04-19 DIAGNOSIS — R208 Other disturbances of skin sensation: Secondary | ICD-10-CM

## 2018-04-19 DIAGNOSIS — M2560 Stiffness of unspecified joint, not elsewhere classified: Secondary | ICD-10-CM

## 2018-04-19 DIAGNOSIS — M25561 Pain in right knee: Secondary | ICD-10-CM

## 2018-04-19 DIAGNOSIS — R262 Difficulty in walking, not elsewhere classified: Secondary | ICD-10-CM

## 2018-04-19 DIAGNOSIS — M79605 Pain in left leg: Secondary | ICD-10-CM

## 2018-04-19 DIAGNOSIS — R269 Unspecified abnormalities of gait and mobility: Secondary | ICD-10-CM

## 2018-04-19 DIAGNOSIS — M5386 Other specified dorsopathies, lumbar region: Secondary | ICD-10-CM

## 2018-04-19 DIAGNOSIS — R29898 Other symptoms and signs involving the musculoskeletal system: Secondary | ICD-10-CM

## 2018-04-19 DIAGNOSIS — M79604 Pain in right leg: Secondary | ICD-10-CM

## 2018-04-19 NOTE — Therapy (Signed)
[place] MAIN [facility] SERVICES [address] [place], [place], [address] Phone: [phone]   Fax:  [phone]  Physical Therapy Treatment  Patient Details  Name: [name] MRN: [id number] Date of Birth: [birth year] Referring Provider (PT): Maryclare Labrador [place], [place]   Encounter Date: 04/19/2018  PT End of Session - 04/19/18 1206    Visit Number  5    Number of Visits  17    Date for PT Re-Evaluation  05/19/18    Authorization Type  5    Authorization Time Period  of 10 progress report    PT Start Time  0955    PT Stop Time  1030    PT Time Calculation (min)  35 min    Activity Tolerance  Patient tolerated treatment well;Patient limited by fatigue    Behavior During Therapy  [facility] for tasks assessed/performed       Past Medical History:  Diagnosis Date  . Arthritis   . Depression     Past Surgical History:  Procedure Laterality Date  . ABDOMINAL HYSTERECTOMY  1995  . back sugery    . BUNIONECTOMY  2013   rt foot  . COLONOSCOPY    . MUSCLE BIOPSY Left 10/03/2012   Procedure: LEFT QUADRICEP MUSCLE BIOPSY;  Surgeon: Odis Hollingshead, MD;  Location: [place];  Service: General;  Laterality: Left;  . NECK SURGERY  2010   cerv disc fused     There were no vitals filed for this visit.  Subjective Assessment - 04/19/18 1210    Subjective  Pt reports a fall at home last week; unsure of mechanism of fall. Pt states she was walking in "stocking feet" across hardwood floors and fell onto her knees. Pt notes hurting primarily R knee, but L mildly sore too. Pt reports using ice 1-2 x only. Pt continues sore today although improved "quite a bit" from last week. Pt did not see a doctor post iinjury. Pt feels able to participate in Pt session today (modified)    Pertinent History  LE weakness.  Symptoms occured suddenly, unknown method of injury prior to her first neck fusion surgery on January 2010. Had lower back surgery fusion in  2011.  The neck and back surgeries did not help. Pt states having increased urinary urgency and takes medication for for it. MD aware.  Denies saddle anesthesia.  Pt states that her doctor told her that PT is the only thing that is going to help her keep moving so she continues to participate in PT.  Last round of PT was last year which helped.  Currently has difficulty walking, performing chores (wash dishes, laundry), cooking. Better able to do her tasks a little bit when she does therapy but gets harder when she stops.  Feels burning and stinging in both her knees, and bilateral anterior and lateral legs.  Pt states not having back or neck pain. Just a stiff neck.  Pt states she usually walks with a cane on her R side. No falls within the last 6 months.        Ambulation (slow today, due to R knee pain post fall)  8 L fwd  8 L side  Chest deep water, 20x ea  slow B hip circles   slow high knee march   Bench, 3 min ea slow  Bike  scissor   flutter  Fwd ambulation 4 L  Active stretch, 3x ea  hamstring/gastroc and hip flexor  quad  Independent ambulation as educated this session x 10 min followed by standing step hamstrings stretch                        PT Education - 04/19/18 1205    Education provided  Yes    Education Details  use of ice post traumatic injury frequency, time and duration    Person(s) Educated  Patient    Methods  Explanation    Comprehension  Verbalized understanding       PT Short Term Goals - 03/21/18 1936      PT SHORT TERM GOAL #1   Title  Patient will be independent with her HEP to improve strength and function.     Time  3    Period  Weeks    Status  New    Target Date  04/14/18        PT Long Term Goals - 03/21/18 1937      PT LONG TERM GOAL #1   Title  Patient will improve B LE strength by at least 1/2 MMT grade to help decrease knee pain, improve ability to ambulate, perform standing tasks.     Time  8    Period   Weeks    Status  New    Target Date  05/19/18      PT LONG TERM GOAL #2   Title  Patient will improve her LEFS score by at least 10 points as a demonstration of improved function.     Baseline  27/80 (03/21/2018)    Time  8    Period  Weeks    Status  New    Target Date  05/19/18      PT LONG TERM GOAL #3   Title  Patient will improve her 10 MWT speed with her SPC to at least 0.5 m/s to promote better community ambulation.     Baseline  0.39 m/s with her SPC (03/21/2018)    Time  8    Period  Weeks    Status  New    Target Date  05/19/18      PT LONG TERM GOAL #4   Title  Patient will have a decrease B knee pain to 4/10 or less at worst to promote ability to ambulate, perform standing tasks.     Baseline  9/10 B knee pain at most (03/21/2018)    Time  8    Period  Weeks    Status  New    Target Date  05/19/18            Plan - 04/19/18 1206    Clinical Impression Statement  Pt 10 min late for session. Pt noted to have swelling about R knee and walking in slower fashion with some pain assoiated in R knee; decreased range with R swing phase in knee as well. Pt tolerated modified session focusing on range and allowing cohesive properties of water to massage/apply supportive pressure to LE. Pt demonstrates slowed ambulation in water as well, but improved comfort versus land. Continue as tolerated.      Rehab Potential  Fair    Clinical Impairments Affecting Rehab Potential  Chronicity of condition, weakness    PT Frequency  2x / week    PT Duration  8 weeks    PT Treatment/Interventions  Electrical Stimulation;Aquatic Therapy;Ultrasound;Gait training;Functional mobility training;Therapeutic activities;Therapeutic exercise;Balance training;Neuromuscular re-education;Patient/family education;Manual techniques;Dry needling;Iontophoresis 4mg /ml Dexamethasone    PT Next Visit  Plan  aquatic therapy, manual techniques, LE strengthening, modalities PRN    Consulted and Agree with Plan  of Care  Patient       Patient will benefit from skilled therapeutic intervention in order to improve the following deficits and impairments:  Pain, Abnormal gait, Decreased balance, Decreased range of motion, Decreased strength, Difficulty walking  Visit Diagnosis: Difficulty in walking, not elsewhere classified  Unsteadiness  Muscle weakness (generalized)  Decreased ROM of lumbar spine  Unsteadiness on feet  Abnormality of gait  Other disturbances of skin sensation  Weakness of both legs  Joint stiffness of spine  Other abnormalities of gait and mobility  Left knee pain, unspecified chronicity  Right knee pain, unspecified chronicity  Pain in right leg  Pain in left leg  Bilateral low back pain without sciatica, unspecified chronicity     Problem List Patient Active Problem List   Diagnosis Date Noted  . Weakness 11/03/2012    Larae Grooms 04/19/2018, 12:13 PM  [place] MAIN [facility] SERVICES [address] [place], [place], [address] Phone: [phone]   Fax:  [phone]  Name: [name] MRN: [id number] Date of Birth: Jul 26, 1945

## 2018-04-20 ENCOUNTER — Ambulatory Visit: Payer: Medicare Other

## 2018-04-20 DIAGNOSIS — M25561 Pain in right knee: Secondary | ICD-10-CM | POA: Diagnosis not present

## 2018-04-20 DIAGNOSIS — M6281 Muscle weakness (generalized): Secondary | ICD-10-CM | POA: Diagnosis not present

## 2018-04-20 DIAGNOSIS — R262 Difficulty in walking, not elsewhere classified: Secondary | ICD-10-CM | POA: Diagnosis not present

## 2018-04-20 DIAGNOSIS — R2681 Unsteadiness on feet: Secondary | ICD-10-CM | POA: Diagnosis not present

## 2018-04-20 DIAGNOSIS — M25562 Pain in left knee: Secondary | ICD-10-CM | POA: Diagnosis not present

## 2018-04-20 DIAGNOSIS — M5386 Other specified dorsopathies, lumbar region: Secondary | ICD-10-CM | POA: Diagnosis not present

## 2018-04-20 NOTE — Therapy (Signed)
[place] [facility] [address], [place], [address] Phone: [phone]   Fax:  [phone]  Physical Therapy Treatment  Patient Details  Name: [name] MRN: [id number] Date of Birth: 16-Sep-1945 Referring Provider (PT): Maryclare Labrador [place], [place]   Encounter Date: 04/20/2018  PT End of Session - 04/20/18 1353    Visit Number  6    Number of Visits  17    Date for PT Re-Evaluation  05/19/18    Authorization Type  6    Authorization Time Period  of 10 progress report    PT Start Time  1354   pt arrived late   PT Stop Time  1442    PT Time Calculation (min)  48 min    Activity Tolerance  Patient tolerated treatment well;Patient limited by fatigue    Behavior During Therapy  [name] for tasks assessed/performed       Past Medical History:  Diagnosis Date  . Arthritis   . Depression     Past Surgical History:  Procedure Laterality Date  . ABDOMINAL HYSTERECTOMY  1995  . back sugery    . BUNIONECTOMY  2013   rt foot  . COLONOSCOPY    . MUSCLE BIOPSY Left 10/03/2012   Procedure: LEFT QUADRICEP MUSCLE BIOPSY;  Surgeon: Odis Hollingshead, MD;  Location: [place];  Service: General;  Laterality: Left;  . NECK SURGERY  2010   cerv disc fused     There were no vitals filed for this visit.  Subjective Assessment - 04/20/18 1355    Subjective  Pt states feeling tired from moving a lot today. The aqua therapy is going good. Keeps her moving a little bit. Had a fall about a week and a half at home. Was walking on her hardwood floor with her socks. Other than that, things are pretty good.  Landed on her knees and elbows.  Did not break anything.  Knees still hurt but not like it did a week ago.   Wants to work on her ankle again because it felt good.     Pertinent History  LE weakness.  Symptoms occured suddenly, unknown method of injury prior to her first neck fusion surgery on January 2010. Had lower back  surgery fusion in 2011.  The neck and back surgeries did not help. Pt states having increased urinary urgency and takes medication for for it. MD aware.  Denies saddle anesthesia.  Pt states that her doctor told her that PT is the only thing that is going to help her keep moving so she continues to participate in PT.  Last round of PT was last year which helped.  Currently has difficulty walking, performing chores (wash dishes, laundry), cooking. Better able to do her tasks a little bit when she does therapy but gets harder when she stops.  Feels burning and stinging in both her knees, and bilateral anterior and lateral legs.  Pt states not having back or neck pain. Just a stiff neck.  Pt states she usually walks with a cane on her R side. No falls within the last 6 months.      Currently in Pain?  Yes    Pain Score  8    B knee wound. Pt states its getting better.  PT Education - 04/20/18 1418    Education provided  Yes    Education Details  ther-ex, HEP    Person(s) Educated  Patient    Methods  Explanation;Demonstration;Tactile cues;Verbal cues;Handout    Comprehension  Returned demonstration;Verbalized understanding       Objectives  Pt notwearing R AFO.   MedBridge Access Code: [email or web address]    Manual therapy  Supine A to P to R ankle joint grade 3   Felt good for ankle per pt.   Supine gentle R ankle distraction to decrease pressure.  Supine manual R gastroc stretch 30 seconds x 3  Therapeutic exercise   Supine R ankle AAROM DF with 5 second holds at end range, 10x  Supine R ankle DF AROM after aforementioned exercise and manual therapy: 2 degrees  supine assisted R SKTC with PT assist 10x3             Improved R hip flexor muscle activation with repetition  Supine marches 10x2 each LE with PT assist  Supine R LE SLR hip flexion with PT assist 5x  Supine SKTC stretch with PT 15 seconds x 3  Then self  stretch using strap 15 seconds x 5   Improved exercise technique, movement at target joints, use of target muscles after min to mod verbal, visual, tactile cues.    Improved activation of R hip flexor muscles with increased repetition of supine SKTC with PT assist exercise. Able to perform supine SKTC and SLR R hip flexion by herself after supine SKTC stretch. Improved R ankle DF AROM to 2 degrees after manual therapy, gastroc stretch, and AAROM DF with holds. Pt able to perform DF AROM to end range. Stiff end feel. Improved R hip flexion and foot clearance during swing phase of gait with less R LE circumduction compensation after session. Continue working on decreasing low back extension pressure and improving R ankle joint mobility and gastroc flexibility. Pt tolerated session well without aggravation of symptoms. Pt will benefit from continued skilled physical therapy services to improve ankle ROM, R LE strength, function, and improve ability to ambulate.            PT Short Term Goals - 03/21/18 1936      PT SHORT TERM GOAL #1   Title  Patient will be independent with her HEP to improve strength and function.     Time  3    Period  Weeks    Status  New    Target Date  04/14/18        PT Long Term Goals - 03/21/18 1937      PT LONG TERM GOAL #1   Title  Patient will improve B LE strength by at least 1/2 MMT grade to help decrease knee pain, improve ability to ambulate, perform standing tasks.     Time  8    Period  Weeks    Status  New    Target Date  05/19/18      PT LONG TERM GOAL #2   Title  Patient will improve her LEFS score by at least 10 points as a demonstration of improved function.     Baseline  27/80 (03/21/2018)    Time  8    Period  Weeks    Status  New    Target Date  05/19/18      PT LONG TERM GOAL #3   Title  Patient will improve her 10 MWT speed with her SPC to at least  0.5 m/s to promote better community ambulation.     Baseline  0.39 m/s with  her SPC (03/21/2018)    Time  8    Period  Weeks    Status  New    Target Date  05/19/18      PT LONG TERM GOAL #4   Title  Patient will have a decrease B knee pain to 4/10 or less at worst to promote ability to ambulate, perform standing tasks.     Baseline  9/10 B knee pain at most (03/21/2018)    Time  8    Period  Weeks    Status  New    Target Date  05/19/18            Plan - 04/20/18 1420    Clinical Impression Statement  Improved activation of R hip flexor muscles with increased repetition of supine SKTC with PT assist exercise. Able to perform supine SKTC and SLR R hip flexion by herself after supine SKTC stretch. Improved R ankle DF AROM to 2 degrees after manual therapy, gastroc stretch, and AAROM DF with holds. Pt able to perform DF AROM to end range. Stiff end feel. Improved R hip flexion and foot clearance during swing phase of gait with less R LE circumduction compensation after session. Continue working on decreasing low back extension pressure and improving R ankle joint mobility and gastroc flexibility. Pt tolerated session well without aggravation of symptoms. Pt will benefit from continued skilled physical therapy services to improve ankle ROM, R LE strength, function, and improve ability to ambulate.     Rehab Potential  Fair    Clinical Impairments Affecting Rehab Potential  Chronicity of condition, weakness    PT Frequency  2x / week    PT Duration  8 weeks    PT Treatment/Interventions  Electrical Stimulation;Aquatic Therapy;Ultrasound;Gait training;Functional mobility training;Therapeutic activities;Therapeutic exercise;Balance training;Neuromuscular re-education;Patient/family education;Manual techniques;Dry needling;Iontophoresis 4mg /ml Dexamethasone    PT Next Visit Plan  aquatic therapy, manual techniques, LE strengthening, modalities PRN    Consulted and Agree with Plan of Care  Patient       Patient will benefit from skilled therapeutic intervention in  order to improve the following deficits and impairments:  Pain, Abnormal gait, Decreased balance, Decreased range of motion, Decreased strength, Difficulty walking  Visit Diagnosis: Difficulty in walking, not elsewhere classified  Unsteadiness  Muscle weakness (generalized)     Problem List Patient Active Problem List   Diagnosis Date Noted  . Weakness 11/03/2012    [name] PT, DPT   04/20/2018, 3:00 PM  [place] [facility] [address], [place], [address] Phone: [phone]   Fax:  [phone]  Name: [name] MRN: [id number] Date of Birth: 08-21-1945

## 2018-04-20 NOTE — Patient Instructions (Addendum)
  MedBridge Access Code: [email or web address]  Hooklying Single Knee to Chest Stretch  5x15 seconds with strap 2x/day

## 2018-04-25 DIAGNOSIS — Z0000 Encounter for general adult medical examination without abnormal findings: Secondary | ICD-10-CM | POA: Diagnosis not present

## 2018-04-25 DIAGNOSIS — R5382 Chronic fatigue, unspecified: Secondary | ICD-10-CM | POA: Diagnosis not present

## 2018-04-25 DIAGNOSIS — N390 Urinary tract infection, site not specified: Secondary | ICD-10-CM | POA: Diagnosis not present

## 2018-04-25 DIAGNOSIS — K219 Gastro-esophageal reflux disease without esophagitis: Secondary | ICD-10-CM | POA: Diagnosis not present

## 2018-04-26 ENCOUNTER — Ambulatory Visit: Payer: Medicare Other | Attending: Physician Assistant

## 2018-04-26 ENCOUNTER — Other Ambulatory Visit: Payer: Self-pay

## 2018-04-26 DIAGNOSIS — R269 Unspecified abnormalities of gait and mobility: Secondary | ICD-10-CM | POA: Diagnosis not present

## 2018-04-26 DIAGNOSIS — M6281 Muscle weakness (generalized): Secondary | ICD-10-CM | POA: Diagnosis not present

## 2018-04-26 DIAGNOSIS — M25562 Pain in left knee: Secondary | ICD-10-CM | POA: Diagnosis not present

## 2018-04-26 DIAGNOSIS — R29898 Other symptoms and signs involving the musculoskeletal system: Secondary | ICD-10-CM | POA: Diagnosis not present

## 2018-04-26 DIAGNOSIS — R2681 Unsteadiness on feet: Secondary | ICD-10-CM

## 2018-04-26 DIAGNOSIS — M79605 Pain in left leg: Secondary | ICD-10-CM | POA: Diagnosis not present

## 2018-04-26 DIAGNOSIS — M2560 Stiffness of unspecified joint, not elsewhere classified: Secondary | ICD-10-CM

## 2018-04-26 DIAGNOSIS — M5386 Other specified dorsopathies, lumbar region: Secondary | ICD-10-CM

## 2018-04-26 DIAGNOSIS — M79604 Pain in right leg: Secondary | ICD-10-CM

## 2018-04-26 DIAGNOSIS — M545 Low back pain, unspecified: Secondary | ICD-10-CM

## 2018-04-26 DIAGNOSIS — R208 Other disturbances of skin sensation: Secondary | ICD-10-CM | POA: Diagnosis not present

## 2018-04-26 DIAGNOSIS — R262 Difficulty in walking, not elsewhere classified: Secondary | ICD-10-CM | POA: Diagnosis not present

## 2018-04-26 DIAGNOSIS — R2689 Other abnormalities of gait and mobility: Secondary | ICD-10-CM | POA: Diagnosis not present

## 2018-04-26 DIAGNOSIS — M25561 Pain in right knee: Secondary | ICD-10-CM

## 2018-04-26 NOTE — Therapy (Signed)
[place] MAIN [facility] SERVICES [address] [place], [place], [address] Phone: [phone]   Fax:  [phone]  Physical Therapy Treatment  Patient Details  Name: [name] MRN: [id number] Date of Birth: 08-Feb-1946 Referring Provider (PT): Maryclare Labrador [place], [place]   Encounter Date: 04/26/2018  PT End of Session - 04/26/18 1658    Visit Number  7    Number of Visits  17    Date for PT Re-Evaluation  05/19/18    Authorization Type  7    Authorization Time Period  of 10 progress report    PT Start Time  0810    PT Stop Time  0855    PT Time Calculation (min)  45 min    Activity Tolerance  Patient tolerated treatment well;Patient limited by fatigue    Behavior During Therapy  [facility] for tasks assessed/performed       Past Medical History:  Diagnosis Date  . Arthritis   . Depression     Past Surgical History:  Procedure Laterality Date  . ABDOMINAL HYSTERECTOMY  1995  . back sugery    . BUNIONECTOMY  2013   rt foot  . COLONOSCOPY    . MUSCLE BIOPSY Left 10/03/2012   Procedure: LEFT QUADRICEP MUSCLE BIOPSY;  Surgeon: Odis Hollingshead, MD;  Location: [place];  Service: General;  Laterality: Left;  . NECK SURGERY  2010   cerv disc fused     There were no vitals filed for this visit.  Subjective Assessment - 04/26/18 1656    Subjective  Pt reports R knee pain is improving since fall 2 weeks ago. Notes mild to moderate soreness today.     Pertinent History  LE weakness.  Symptoms occured suddenly, unknown method of injury prior to her first neck fusion surgery on January 2010. Had lower back surgery fusion in 2011.  The neck and back surgeries did not help. Pt states having increased urinary urgency and takes medication for for it. MD aware.  Denies saddle anesthesia.  Pt states that her doctor told her that PT is the only thing that is going to help her keep moving so she continues to participate in PT.  Last round of PT  was last year which helped.  Currently has difficulty walking, performing chores (wash dishes, laundry), cooking. Better able to do her tasks a little bit when she does therapy but gets harder when she stops.  Feels burning and stinging in both her knees, and bilateral anterior and lateral legs.  Pt states not having back or neck pain. Just a stiff neck.  Pt states she usually walks with a cane on her R side. No falls within the last 6 months.        Warm up ambulation  4 L fwd  4 L side  Ambulation with 2# ankle wts  L ankle wt, 2L fwd  B ankle wts, 4 L fwd  4 L side  Railing; chest deep, core/LE, 2# ankle wts; 20x ea  Squats  Hip abd, B  Hip ext, B  High knee march, B  Bench, core/LE, 2# ankle wts; 2 x 10 ea  small range hip flexion  Up and outs  STS, equal wt BLE  STS, R toe touch L, 10x  STS, L toe touch R, 10x  Independent walking with and without ankle wts. Did encourage more so without wts due to increased time using during session; however pt used majority  of the time.                        PT Education - 04/26/18 1657    Education provided  Yes    Education Details  Adding ankle weight to exercises/ambulation for both grounding purposes and strengthening.     Person(s) Educated  Patient    Methods  Explanation;Demonstration    Comprehension  Verbalized understanding;Returned demonstration       PT Short Term Goals - 03/21/18 1936      PT SHORT TERM GOAL #1   Title  Patient will be independent with her HEP to improve strength and function.     Time  3    Period  Weeks    Status  New    Target Date  04/14/18        PT Long Term Goals - 03/21/18 1937      PT LONG TERM GOAL #1   Title  Patient will improve B LE strength by at least 1/2 MMT grade to help decrease knee pain, improve ability to ambulate, perform standing tasks.     Time  8    Period  Weeks    Status  New    Target Date  05/19/18      PT LONG TERM GOAL #2   Title   Patient will improve her LEFS score by at least 10 points as a demonstration of improved function.     Baseline  27/80 (03/21/2018)    Time  8    Period  Weeks    Status  New    Target Date  05/19/18      PT LONG TERM GOAL #3   Title  Patient will improve her 10 MWT speed with her SPC to at least 0.5 m/s to promote better community ambulation.     Baseline  0.39 m/s with her SPC (03/21/2018)    Time  8    Period  Weeks    Status  New    Target Date  05/19/18      PT LONG TERM GOAL #4   Title  Patient will have a decrease B knee pain to 4/10 or less at worst to promote ability to ambulate, perform standing tasks.     Baseline  9/10 B knee pain at most (03/21/2018)    Time  8    Period  Weeks    Status  New    Target Date  05/19/18            Plan - 04/26/18 1658    Clinical Impression Statement  Pt demonstrated improved ambulation swing through R hip flexion, step length and heel strike with addition of 2# wt on R ankle; LLE noted to have decreased step length. Added to LLE as well with improved reciprocal gait pattern bilaterally. Pt notes tolerance of increased resist without aggravating R knee symptoms. Maintaining ankle weights for core/LE exercises at rail and bench. Continue to progess strength to improve functional mobility and reduce fall risk.     Rehab Potential  Fair    Clinical Impairments Affecting Rehab Potential  Chronicity of condition, weakness    PT Frequency  2x / week    PT Duration  8 weeks    PT Treatment/Interventions  Electrical Stimulation;Aquatic Therapy;Ultrasound;Gait training;Functional mobility training;Therapeutic activities;Therapeutic exercise;Balance training;Neuromuscular re-education;Patient/family education;Manual techniques;Dry needling;Iontophoresis 4mg /ml Dexamethasone    PT Next Visit Plan  aquatic therapy, manual techniques, LE strengthening, modalities PRN  Consulted and Agree with Plan of Care  Patient       Patient will benefit  from skilled therapeutic intervention in order to improve the following deficits and impairments:  Pain, Abnormal gait, Decreased balance, Decreased range of motion, Decreased strength, Difficulty walking  Visit Diagnosis: Difficulty in walking, not elsewhere classified  Unsteadiness  Muscle weakness (generalized)  Decreased ROM of lumbar spine  Unsteadiness on feet  Abnormality of gait  Other disturbances of skin sensation  Weakness of both legs  Joint stiffness of spine  Other abnormalities of gait and mobility  Left knee pain, unspecified chronicity  Right knee pain, unspecified chronicity  Pain in right leg  Pain in left leg  Bilateral low back pain without sciatica, unspecified chronicity     Problem List Patient Active Problem List   Diagnosis Date Noted  . Weakness 11/03/2012    Larae Grooms 04/26/2018, 5:03 PM  [place] MAIN [facility] SERVICES [address] [place], [place], [address] Phone: [phone]   Fax:  [phone]  Name: [name] MRN: [id number] Date of Birth: 1945/10/19

## 2018-04-28 ENCOUNTER — Ambulatory Visit: Payer: Medicare Other

## 2018-05-05 DIAGNOSIS — N3941 Urge incontinence: Secondary | ICD-10-CM | POA: Diagnosis not present

## 2018-05-05 DIAGNOSIS — F322 Major depressive disorder, single episode, severe without psychotic features: Secondary | ICD-10-CM | POA: Diagnosis not present

## 2018-05-05 DIAGNOSIS — M6281 Muscle weakness (generalized): Secondary | ICD-10-CM | POA: Diagnosis not present

## 2018-05-05 DIAGNOSIS — R5382 Chronic fatigue, unspecified: Secondary | ICD-10-CM | POA: Diagnosis not present

## 2018-05-05 DIAGNOSIS — H9313 Tinnitus, bilateral: Secondary | ICD-10-CM | POA: Diagnosis not present

## 2018-05-05 DIAGNOSIS — F0390 Unspecified dementia without behavioral disturbance: Secondary | ICD-10-CM | POA: Diagnosis not present

## 2018-05-05 DIAGNOSIS — G959 Disease of spinal cord, unspecified: Secondary | ICD-10-CM | POA: Diagnosis not present

## 2018-05-05 DIAGNOSIS — M5136 Other intervertebral disc degeneration, lumbar region: Secondary | ICD-10-CM | POA: Diagnosis not present

## 2018-05-05 DIAGNOSIS — K5901 Slow transit constipation: Secondary | ICD-10-CM | POA: Diagnosis not present

## 2018-05-12 ENCOUNTER — Ambulatory Visit: Payer: Medicare Other

## 2018-05-12 ENCOUNTER — Other Ambulatory Visit: Payer: Self-pay

## 2018-05-12 DIAGNOSIS — M5386 Other specified dorsopathies, lumbar region: Secondary | ICD-10-CM

## 2018-05-12 DIAGNOSIS — R269 Unspecified abnormalities of gait and mobility: Secondary | ICD-10-CM

## 2018-05-12 DIAGNOSIS — M79605 Pain in left leg: Secondary | ICD-10-CM

## 2018-05-12 DIAGNOSIS — M79604 Pain in right leg: Secondary | ICD-10-CM

## 2018-05-12 DIAGNOSIS — M6281 Muscle weakness (generalized): Secondary | ICD-10-CM | POA: Diagnosis not present

## 2018-05-12 DIAGNOSIS — H25013 Cortical age-related cataract, bilateral: Secondary | ICD-10-CM | POA: Diagnosis not present

## 2018-05-12 DIAGNOSIS — R262 Difficulty in walking, not elsewhere classified: Secondary | ICD-10-CM

## 2018-05-12 DIAGNOSIS — H04123 Dry eye syndrome of bilateral lacrimal glands: Secondary | ICD-10-CM | POA: Diagnosis not present

## 2018-05-12 DIAGNOSIS — H2513 Age-related nuclear cataract, bilateral: Secondary | ICD-10-CM | POA: Diagnosis not present

## 2018-05-12 DIAGNOSIS — R2689 Other abnormalities of gait and mobility: Secondary | ICD-10-CM

## 2018-05-12 DIAGNOSIS — R29898 Other symptoms and signs involving the musculoskeletal system: Secondary | ICD-10-CM

## 2018-05-12 DIAGNOSIS — M545 Low back pain, unspecified: Secondary | ICD-10-CM

## 2018-05-12 DIAGNOSIS — R2681 Unsteadiness on feet: Secondary | ICD-10-CM

## 2018-05-12 DIAGNOSIS — R208 Other disturbances of skin sensation: Secondary | ICD-10-CM

## 2018-05-12 DIAGNOSIS — M25561 Pain in right knee: Secondary | ICD-10-CM

## 2018-05-12 DIAGNOSIS — H40023 Open angle with borderline findings, high risk, bilateral: Secondary | ICD-10-CM | POA: Diagnosis not present

## 2018-05-12 DIAGNOSIS — M25562 Pain in left knee: Secondary | ICD-10-CM

## 2018-05-12 DIAGNOSIS — M2560 Stiffness of unspecified joint, not elsewhere classified: Secondary | ICD-10-CM

## 2018-05-12 NOTE — Therapy (Signed)
[place] MAIN [facility] SERVICES [address] [place], [place], [address] Phone: [phone]   Fax:  [phone]  Physical Therapy Treatment  Patient Details  Name: [name] MRN: [id number] Date of Birth: 23-Jun-1945 Referring Provider (PT): Maryclare Labrador [place], [place]   Encounter Date: 05/12/2018  PT End of Session - 05/12/18 1002    Visit Number  8    Number of Visits  17    Authorization Type  8    Authorization Time Period  of 10 progress report    PT Start Time  0810    PT Stop Time  0850    PT Time Calculation (min)  40 min    Activity Tolerance  Patient tolerated treatment well;Patient limited by fatigue    Behavior During Therapy  [facility] for tasks assessed/performed       Past Medical History:  Diagnosis Date  . Arthritis   . Depression     Past Surgical History:  Procedure Laterality Date  . ABDOMINAL HYSTERECTOMY  1995  . back sugery    . BUNIONECTOMY  2013   rt foot  . COLONOSCOPY    . MUSCLE BIOPSY Left 10/03/2012   Procedure: LEFT QUADRICEP MUSCLE BIOPSY;  Surgeon: Odis Hollingshead, MD;  Location: [place];  Service: General;  Laterality: Left;  . NECK SURGERY  2010   cerv disc fused     There were no vitals filed for this visit.  Subjective Assessment - 05/12/18 0959    Subjective  Pt states R knee pain has resolved from fall a month ago. No new falls. Pt reports no increased soreness/issues after last session using 2# ankle wts. Pt states she enjoyed the work using them in the pool and agrees that it helped her improve RLE mechanics with ambulation    Pertinent History  LE weakness.  Symptoms occured suddenly, unknown method of injury prior to her first neck fusion surgery on January 2010. Had lower back surgery fusion in 2011.  The neck and back surgeries did not help. Pt states having increased urinary urgency and takes medication for for it. MD aware.  Denies saddle anesthesia.  Pt states that her  doctor told her that PT is the only thing that is going to help her keep moving so she continues to participate in PT.  Last round of PT was last year which helped.  Currently has difficulty walking, performing chores (wash dishes, laundry), cooking. Better able to do her tasks a little bit when she does therapy but gets harder when she stops.  Feels burning and stinging in both her knees, and bilateral anterior and lateral legs.  Pt states not having back or neck pain. Just a stiff neck.  Pt states she usually walks with a cane on her R side. No falls within the last 6 months.        Ambulation, 2# ankle wts; focus on R swing hip flex and ankle DF  6 L fwd  6 L side  Core with LE strengthening, 2# ankle wts; B 25x ea  High knee march with DF  Hip abd/add  Hip flex/ext  Squats   Core strengthening, blue dumbbell; 2 x 10 ea (1 set thru followed by 2nd set thru)  R oblique crunch  L oblique crunch  fwd flex  Independent ambulation with 2# wts x 20 min  PT Education - 05/12/18 1009    Education provided  Yes    Education Details  Resisted core work with blue dumbbell    Person(s) Educated  Patient    Methods  Explanation;Demonstration    Comprehension  Verbalized understanding;Returned demonstration;Verbal cues required       PT Short Term Goals - 03/21/18 1936      PT SHORT TERM GOAL #1   Title  Patient will be independent with her HEP to improve strength and function.     Time  3    Period  Weeks    Status  New    Target Date  04/14/18        PT Long Term Goals - 03/21/18 1937      PT LONG TERM GOAL #1   Title  Patient will improve B LE strength by at least 1/2 MMT grade to help decrease knee pain, improve ability to ambulate, perform standing tasks.     Time  8    Period  Weeks    Status  New    Target Date  05/19/18      PT LONG TERM GOAL #2   Title  Patient will improve her LEFS score by at least 10 points as a  demonstration of improved function.     Baseline  27/80 (03/21/2018)    Time  8    Period  Weeks    Status  New    Target Date  05/19/18      PT LONG TERM GOAL #3   Title  Patient will improve her 10 MWT speed with her SPC to at least 0.5 m/s to promote better community ambulation.     Baseline  0.39 m/s with her SPC (03/21/2018)    Time  8    Period  Weeks    Status  New    Target Date  05/19/18      PT LONG TERM GOAL #4   Title  Patient will have a decrease B knee pain to 4/10 or less at worst to promote ability to ambulate, perform standing tasks.     Baseline  9/10 B knee pain at most (03/21/2018)    Time  8    Period  Weeks    Status  New    Target Date  05/19/18            Plan - 05/12/18 1005    Clinical Impression Statement  Pt 10 min late for session; therefore, session limited to 40 min. Pt did perform independet ambulation with 2# ankle wts post session for 20 minutes. Pt tolerates session well with 2# wts throughout. Pt does requires cues for improved knee and ankle flexion on R with swing phase initially, but then continues with good technique. Pt states, "I think it is just habit at times". Discussion/education again regarding principles of water and taking advantage of bouyant environment to allow for good mechanics. Pt tolerated addition of resisted core work well. Continue aquatic PT for core/LE strengthening and improved quality/safety of ambulation    Rehab Potential  Fair    Clinical Impairments Affecting Rehab Potential  Chronicity of condition, weakness    PT Frequency  2x / week    PT Duration  8 weeks    PT Treatment/Interventions  Electrical Stimulation;Aquatic Therapy;Ultrasound;Gait training;Functional mobility training;Therapeutic activities;Therapeutic exercise;Balance training;Neuromuscular re-education;Patient/family education;Manual techniques;Dry needling;Iontophoresis 4mg /ml Dexamethasone    PT Next Visit Plan  aquatic therapy, manual techniques,  LE strengthening, modalities PRN  Consulted and Agree with Plan of Care  Patient       Patient will benefit from skilled therapeutic intervention in order to improve the following deficits and impairments:  Pain, Abnormal gait, Decreased balance, Decreased range of motion, Decreased strength, Difficulty walking  Visit Diagnosis: Difficulty in walking, not elsewhere classified  Unsteadiness  Muscle weakness (generalized)  Decreased ROM of lumbar spine  Unsteadiness on feet  Abnormality of gait  Right knee pain, unspecified chronicity  Left knee pain, unspecified chronicity  Other abnormalities of gait and mobility  Joint stiffness of spine  Other disturbances of skin sensation  Pain in right leg  Pain in left leg  Weakness of both legs  Bilateral low back pain without sciatica, unspecified chronicity     Problem List Patient Active Problem List   Diagnosis Date Noted  . Weakness 11/03/2012    Larae Grooms 05/12/2018, 10:10 AM  [place] MAIN [facility] SERVICES [address] [place], [place], [address] Phone: [phone]   Fax:  [phone]  Name: [name] MRN: [id number] Date of Birth: 1945-07-10

## 2018-05-18 ENCOUNTER — Ambulatory Visit: Payer: Medicare Other

## 2018-05-18 DIAGNOSIS — R262 Difficulty in walking, not elsewhere classified: Secondary | ICD-10-CM | POA: Diagnosis not present

## 2018-05-18 DIAGNOSIS — R269 Unspecified abnormalities of gait and mobility: Secondary | ICD-10-CM | POA: Diagnosis not present

## 2018-05-18 DIAGNOSIS — M79604 Pain in right leg: Secondary | ICD-10-CM

## 2018-05-18 DIAGNOSIS — R208 Other disturbances of skin sensation: Secondary | ICD-10-CM | POA: Diagnosis not present

## 2018-05-18 DIAGNOSIS — M25561 Pain in right knee: Secondary | ICD-10-CM

## 2018-05-18 DIAGNOSIS — M5386 Other specified dorsopathies, lumbar region: Secondary | ICD-10-CM | POA: Diagnosis not present

## 2018-05-18 DIAGNOSIS — M25562 Pain in left knee: Secondary | ICD-10-CM

## 2018-05-18 DIAGNOSIS — M6281 Muscle weakness (generalized): Secondary | ICD-10-CM | POA: Diagnosis not present

## 2018-05-18 DIAGNOSIS — R2681 Unsteadiness on feet: Secondary | ICD-10-CM

## 2018-05-18 DIAGNOSIS — M79605 Pain in left leg: Secondary | ICD-10-CM

## 2018-05-18 NOTE — Therapy (Signed)
[place] PHYSICAL AND SPORTS MEDICINE [address], [place], [address] Phone: [phone]   Fax:  [phone]  Physical Therapy Treatment And Progress Report (03/21/2018 - 05/18/2018)  Patient Details  Name: [name] MRN: [id number] Date of Birth: [birth year] Referring Provider (PT): Maryclare Labrador [place], [place]   Encounter Date: 05/18/2018  PT End of Session - 05/18/18 1128    Visit Number  9    Number of Visits  34    Date for PT Re-Evaluation  07/14/18    Authorization Type  9    Authorization Time Period  of 10 progress report    PT Start Time  1128   pt arrived late   PT Stop Time  1210    PT Time Calculation (min)  42 min    Activity Tolerance  Patient tolerated treatment well;Patient limited by fatigue    Behavior During Therapy  [facility] for tasks assessed/performed       Past Medical History:  Diagnosis Date  . Arthritis   . Depression     Past Surgical History:  Procedure Laterality Date  . ABDOMINAL HYSTERECTOMY  1995  . back sugery    . BUNIONECTOMY  2013   rt foot  . COLONOSCOPY    . MUSCLE BIOPSY Left 10/03/2012   Procedure: LEFT QUADRICEP MUSCLE BIOPSY;  Surgeon: Odis Hollingshead, MD;  Location: [place];  Service: General;  Laterality: Left;  . NECK SURGERY  2010   cerv disc fused     There were no vitals filed for this visit.  Subjective Assessment - 05/18/18 1130    Subjective  Knees feel ok. 4/10 B knee pain when walking currently. 5/10 B knee pain at most for the past 7 days. Feels like the PT at the pool helps a lot.  Walking into the store to get what she needs is a little better. Still has good and bad days.  R foot does not drag as much when she walks especially when she is mindful of it.  Wants to be better able to walk up a hill. The R foot and ankle does not swell as much and the numbness is less. Feels like the manual therapy for the ankle helps.     Pertinent History  LE weakness.   Symptoms occured suddenly, unknown method of injury prior to her first neck fusion surgery on January 2010. Had lower back surgery fusion in 2011.  The neck and back surgeries did not help. Pt states having increased urinary urgency and takes medication for for it. MD aware.  Denies saddle anesthesia.  Pt states that her doctor told her that PT is the only thing that is going to help her keep moving so she continues to participate in PT.  Last round of PT was last year which helped.  Currently has difficulty walking, performing chores (wash dishes, laundry), cooking. Better able to do her tasks a little bit when she does therapy but gets harder when she stops.  Feels burning and stinging in both her knees, and bilateral anterior and lateral legs.  Pt states not having back or neck pain. Just a stiff neck.  Pt states she usually walks with a cane on her R side. No falls within the last 6 months.      Currently in Pain?  Yes    Pain Score  4    B knee pain with gait.  [facility] PT Assessment - 05/18/18 1134      Strength   Right Hip Flexion  2-/5    Right Hip Extension  4+/5   seated manually resisted   Right Hip ABduction  4/5   seated clamshell position, hips less than 90 degrees flexion   Left Hip Flexion  4+/5    Left Hip Extension  4+/5   seated manually resisted   Left Hip ABduction  4/5   seated clamshell position, hips less than 90 degrees flexion   Right Knee Flexion  4/5    Right Knee Extension  5/5    Left Knee Flexion  4/5    Left Knee Extension  5/5    Right Ankle Dorsiflexion  4+/5   at available range; stiff end feel   Left Ankle Dorsiflexion  5/5                                Objectives  Pt notwearing R AFO.   MedBridge Access Code: [email or web address]    Therapeutic exercise  Seated manually resisted hip flexion, knee extension, knee flexion, clamshell, hip extension 1-2x each way for each L (except R hip flexion)  Reviewed  progress/current status with strength with pt .  Gait x 10 meters with SPC  13.66 seconds, 11.91 seconds (12.79 seconds average; 0.25ms average)   supine assisted R SKTC stretch with PT assist 10x5 seconds for 2 sets  Supine assist R SKTC with PT assist 10x   Supine marches 10x2 each LE with PT assist  Improved exercise technique, movement at target joints, use of target muscles after min to mod verbal, visual, tactile cues.    Manual therapy Supine A to P to R ankle joint grade 3              Felt good for ankle per pt.   Supine gentle R ankle distraction to decrease pressure. Felt good per pt.   Supine manual R gastroc stretch 30 seconds x 3   Decreased R ankle stiffness after manual therapy. R hip flexor muscle use felt throughout range with assisted hip flexion. Improved R low back comfort with SKTC exercise. Pt tolerated session well without complain of pain. More neutral position of R foot at rest after manual therapy.      Pt demonstrates improved B LE strength, decreased B knee pain, and improved gait speed since initial evaluation. Pt also states that she feels like the aqua therapy as well as the manual therapy to her R ankle are helping and notices better ability to clear her R foot when walking. Stiff end feel with R ankle DF and performed manual therapy to help decrease stiffness and improve AROM. Pt still demonstrates difficulty with R hip flexion due to weakness but muscle activation palpated when assisted throughout the range. Pt still demonstrates B LE weakness, decreased R ankle DF, decreased R hip flexion, and difficulty ambulating and performing functional tasks and would benefit from continued skilled physical therapy services to address the aforementioned deficits.       PT Education - 05/18/18 1133    Education provided  Yes    Education Details  ther-ex    [employer] Educated  Patient    Methods  Explanation;Demonstration;Tactile cues;Verbal cues     Comprehension  Returned demonstration;Verbalized understanding       PT Short Term Goals - 05/18/18 1247      PT SHORT TERM GOAL #  1   Title  Patient will be independent with her HEP to improve strength and function.     Time  3    Period  Weeks    Status  On-going    Target Date  04/14/18        PT Long Term Goals - 05/18/18 1247      PT LONG TERM GOAL #1   Title  Patient will improve B LE strength by at least 1/2 MMT grade to help decrease knee pain, improve ability to ambulate, perform standing tasks.     Time  8    Period  Weeks    Status  Partially Met    Target Date  07/14/18      PT LONG TERM GOAL #2   Title  Patient will improve her LEFS score by at least 10 points as a demonstration of improved function.     Baseline  27/80 (03/21/2018)    Time  8    Period  Weeks    Status  On-going    Target Date  07/14/18      PT LONG TERM GOAL #3   Title  Patient will improve her 10 MWT speed with her SPC to at least 0.5 m/s to promote better community ambulation.     Baseline  0.39 m/s with her SPC (03/21/2018); 0.78 m/s (05/18/2018)    Time  8    Period  Weeks    Status  Partially Met    Target Date  07/14/18      PT LONG TERM GOAL #4   Title  Patient will have a decrease B knee pain to 4/10 or less at worst to promote ability to ambulate, perform standing tasks.     Baseline  9/10 B knee pain at most (03/21/2018); 5/10 B knee pain at worst for the past 7 days (05/18/2018)    Time  8    Period  Weeks    Status  Partially Met    Target Date  07/14/18            Plan - 05/18/18 1126    Clinical Impression Statement  Pt demonstrates improved B LE strength, decreased B knee pain, and improved gait speed since initial evaluation. Pt also states that she feels like the aqua therapy as well as the manual therapy to her R ankle are helping and notices better ability to clear her R foot when walking. Stiff end feel with R ankle DF and performed manual therapy to help  decrease stiffness and improve AROM. Pt still demonstrates difficulty with R hip flexion due to weakness but muscle activation palpated when assisted throughout the range. Pt still demonstrates B LE weakness, decreased R ankle DF, decreased R hip flexion, and difficulty ambulating and performing functional tasks and would benefit from continued skilled physical therapy services to address the aforementioned deficits.       History and Personal Factors relevant to plan of care:  Chronicity of condition, weakness, difficulty walking, bilateral knee pain, altered gait patter, hx of neck and back surgery.     Clinical Presentation  Stable    Clinical Presentation due to:  improved 10 MWT, slight improved LE strength    Clinical Decision Making  Low    Rehab Potential  Fair    Clinical Impairments Affecting Rehab Potential  Chronicity of condition, weakness    PT Frequency  2x / week    PT Duration  8 weeks    PT  Treatment/Interventions  Printmaker;Aquatic Therapy;Ultrasound;Gait training;Functional mobility training;Therapeutic activities;Therapeutic exercise;Balance training;Neuromuscular re-education;Patient/family education;Manual techniques;Dry needling;Iontophoresis '4mg'$ /ml Dexamethasone    PT Next Visit Plan  aquatic therapy, manual techniques, LE strengthening, modalities PRN    Consulted and Agree with Plan of Care  Patient       Patient will benefit from skilled therapeutic intervention in order to improve the following deficits and impairments:  Pain, Abnormal gait, Decreased balance, Decreased range of motion, Decreased strength, Difficulty walking  Visit Diagnosis: Difficulty in walking, not elsewhere classified - Plan: PT plan of care cert/re-cert  Muscle weakness (generalized) - Plan: PT plan of care cert/re-cert  Right knee pain, unspecified chronicity - Plan: PT plan of care cert/re-cert  Left knee pain, unspecified chronicity - Plan: PT plan of care  cert/re-cert  Unsteadiness on feet - Plan: PT plan of care cert/re-cert  Pain in right leg - Plan: PT plan of care cert/re-cert  Pain in left leg - Plan: PT plan of care cert/re-cert     Problem List Patient Active Problem List   Diagnosis Date Noted  . Weakness 11/03/2012   Thank you for your referral.  [name] PT, DPT   05/18/2018, 1:28 PM  [place] PHYSICAL AND SPORTS MEDICINE [address], [place], [address] Phone: [phone]   Fax:  [phone]  Name: [name] MRN: [id number] Date of Birth: 12-Oct-1945

## 2018-05-26 ENCOUNTER — Ambulatory Visit

## 2018-05-31 ENCOUNTER — Ambulatory Visit: Payer: Medicare Other | Attending: Physician Assistant

## 2018-05-31 ENCOUNTER — Other Ambulatory Visit: Payer: Self-pay

## 2018-05-31 DIAGNOSIS — M25561 Pain in right knee: Secondary | ICD-10-CM

## 2018-05-31 DIAGNOSIS — M79605 Pain in left leg: Secondary | ICD-10-CM | POA: Diagnosis not present

## 2018-05-31 DIAGNOSIS — R29898 Other symptoms and signs involving the musculoskeletal system: Secondary | ICD-10-CM | POA: Diagnosis not present

## 2018-05-31 DIAGNOSIS — M545 Low back pain, unspecified: Secondary | ICD-10-CM

## 2018-05-31 DIAGNOSIS — R2681 Unsteadiness on feet: Secondary | ICD-10-CM

## 2018-05-31 DIAGNOSIS — M2560 Stiffness of unspecified joint, not elsewhere classified: Secondary | ICD-10-CM

## 2018-05-31 DIAGNOSIS — M5386 Other specified dorsopathies, lumbar region: Secondary | ICD-10-CM

## 2018-05-31 DIAGNOSIS — M25562 Pain in left knee: Secondary | ICD-10-CM

## 2018-05-31 DIAGNOSIS — R262 Difficulty in walking, not elsewhere classified: Secondary | ICD-10-CM | POA: Diagnosis not present

## 2018-05-31 DIAGNOSIS — M79604 Pain in right leg: Secondary | ICD-10-CM | POA: Diagnosis not present

## 2018-05-31 DIAGNOSIS — R269 Unspecified abnormalities of gait and mobility: Secondary | ICD-10-CM

## 2018-05-31 DIAGNOSIS — M6281 Muscle weakness (generalized): Secondary | ICD-10-CM | POA: Insufficient documentation

## 2018-05-31 DIAGNOSIS — R208 Other disturbances of skin sensation: Secondary | ICD-10-CM

## 2018-05-31 DIAGNOSIS — R2689 Other abnormalities of gait and mobility: Secondary | ICD-10-CM

## 2018-05-31 NOTE — Therapy (Signed)
[place] MAIN [facility] SERVICES [address] [place], [place], [address] Phone: [phone]   Fax:  [phone]  Physical Therapy Treatment  Patient Details  Name: [name] MRN: [id number] Date of Birth: [birth year] Referring Provider (PT): Maryclare Labrador [place], [place]   Encounter Date: 05/31/2018  PT End of Session - 05/31/18 1238    Visit Number  10    Number of Visits  34    Date for PT Re-Evaluation  07/14/18    Authorization Type  10    Authorization Time Period  of 10 progress report    PT Start Time  1005    PT Stop Time  1040    PT Time Calculation (min)  35 min    Activity Tolerance  Patient tolerated treatment well;Patient limited by fatigue    Behavior During Therapy  [facility] for tasks assessed/performed       Past Medical History:  Diagnosis Date  . Arthritis   . Depression     Past Surgical History:  Procedure Laterality Date  . ABDOMINAL HYSTERECTOMY  1995  . back sugery    . BUNIONECTOMY  2013   rt foot  . COLONOSCOPY    . MUSCLE BIOPSY Left 10/03/2012   Procedure: LEFT QUADRICEP MUSCLE BIOPSY;  Surgeon: Odis Hollingshead, MD;  Location: [place];  Service: General;  Laterality: Left;  . NECK SURGERY  2010   cerv disc fused     There were no vitals filed for this visit.  Subjective Assessment - 05/31/18 1236    Subjective  Pt states she has been away from aquatic therapy for 2 weeks due to her husband having medical problems. Pt states she "notices" when she is not doing therapy: decreased strength, energy and ability to walk decreases.     Pertinent History  LE weakness.  Symptoms occured suddenly, unknown method of injury prior to her first neck fusion surgery on January 2010. Had lower back surgery fusion in 2011.  The neck and back surgeries did not help. Pt states having increased urinary urgency and takes medication for for it. MD aware.  Denies saddle anesthesia.  Pt states that her doctor told her  that PT is the only thing that is going to help her keep moving so she continues to participate in PT.  Last round of PT was last year which helped.  Currently has difficulty walking, performing chores (wash dishes, laundry), cooking. Better able to do her tasks a little bit when she does therapy but gets harder when she stops.  Feels burning and stinging in both her knees, and bilateral anterior and lateral legs.  Pt states not having back or neck pain. Just a stiff neck.  Pt states she usually walks with a cane on her R side. No falls within the last 6 months.        Ambulation, 2# wts  Fwd 6 L  Side step 6 L  High knee march, 4 L  Bkwd 4 L   Core with LE at rail, 2# wts, 20x ea B  Hip abd/add  Hip flex/ext  Squats  Bench, 2' each with 30 sec rest in between  [place] - 05/18/18 1247      PT SHORT TERM GOAL #1  Title  Patient will be independent with her HEP to improve strength and function.     Time  3    Period  Weeks    Status  On-going    Target Date  04/14/18        PT Long Term Goals - 05/18/18 1247      PT LONG TERM GOAL #1   Title  Patient will improve B LE strength by at least 1/2 MMT grade to help decrease knee pain, improve ability to ambulate, perform standing tasks.     Time  8    Period  Weeks    Status  Partially Met    Target Date  07/14/18      PT LONG TERM GOAL #2   Title  Patient will improve her LEFS score by at least 10 points as a demonstration of improved function.     Baseline  27/80 (03/21/2018)    Time  8    Period  Weeks    Status  On-going    Target Date  07/14/18      PT LONG TERM GOAL #3   Title  Patient will improve her 10 MWT speed with her SPC to at least 0.5 m/s to promote better community ambulation.     Baseline  0.39 m/s with her SPC (03/21/2018); 0.78 m/s (05/18/2018)    Time  8    Period  Weeks    Status  Partially Met    Target Date   07/14/18      PT LONG TERM GOAL #4   Title  Patient will have a decrease B knee pain to 4/10 or less at worst to promote ability to ambulate, perform standing tasks.     Baseline  9/10 B knee pain at most (03/21/2018); 5/10 B knee pain at worst for the past 7 days (05/18/2018)    Time  8    Period  Weeks    Status  Partially Met    Target Date  07/14/18            Plan - 05/31/18 1239    Clinical Impression Statement  Pt arrived 20 min late for aquatic session: therefore session shortened. Pt felt it was about all she could tolerate today due to fatigue and did not opt to stay for independent ambulation, as pt does routinely. Overall, pt ambulating/exercises with decreased strength/speed today and tactile cues for core stabilization and verbal cues for range of LEs to alow for strengthening. Continue to allow pt consistency to demonstrate strength/mobility improvement.     Rehab Potential  Fair    Clinical Impairments Affecting Rehab Potential  Chronicity of condition, weakness    PT Frequency  2x / week    PT Duration  8 weeks    PT Treatment/Interventions  Electrical Stimulation;Aquatic Therapy;Ultrasound;Gait training;Functional mobility training;Therapeutic activities;Therapeutic exercise;Balance training;Neuromuscular re-education;Patient/family education;Manual techniques;Dry needling;Iontophoresis 98m/ml Dexamethasone    PT Next Visit Plan  aquatic therapy, manual techniques, LE strengthening, modalities PRN    Consulted and Agree with Plan of Care  Patient       Patient will benefit from skilled therapeutic intervention in order to improve the following deficits and impairments:  Pain, Abnormal gait, Decreased balance, Decreased range of motion, Decreased strength, Difficulty walking  Visit Diagnosis: Difficulty in walking, not elsewhere classified  Pain in left leg  Other disturbances of skin sensation  Weakness of both legs  Unsteadiness  Right knee pain,  unspecified chronicity  Decreased ROM of lumbar  spine  Bilateral low back pain without sciatica, unspecified chronicity  Abnormality of gait  Left knee pain, unspecified chronicity  Unsteadiness on feet  Other abnormalities of gait and mobility  Pain in right leg  Joint stiffness of spine     Problem List Patient Active Problem List   Diagnosis Date Noted  . Weakness 11/03/2012    Larae Grooms 05/31/2018, 12:44 PM  [place] MAIN [facility] SERVICES [address] [place], [place], [address] Phone: [phone]   Fax:  [phone]  Name: DEVANEE POMPLUN MRN: [id number] Date of Birth: 1945/09/03

## 2018-06-02 ENCOUNTER — Ambulatory Visit: Payer: Medicare Other

## 2018-06-02 ENCOUNTER — Other Ambulatory Visit: Payer: Self-pay

## 2018-06-02 DIAGNOSIS — M25561 Pain in right knee: Secondary | ICD-10-CM | POA: Diagnosis not present

## 2018-06-02 DIAGNOSIS — M25562 Pain in left knee: Secondary | ICD-10-CM

## 2018-06-02 DIAGNOSIS — M79605 Pain in left leg: Secondary | ICD-10-CM | POA: Diagnosis not present

## 2018-06-02 DIAGNOSIS — R2689 Other abnormalities of gait and mobility: Secondary | ICD-10-CM

## 2018-06-02 DIAGNOSIS — M5386 Other specified dorsopathies, lumbar region: Secondary | ICD-10-CM

## 2018-06-02 DIAGNOSIS — R208 Other disturbances of skin sensation: Secondary | ICD-10-CM

## 2018-06-02 DIAGNOSIS — R262 Difficulty in walking, not elsewhere classified: Secondary | ICD-10-CM | POA: Diagnosis not present

## 2018-06-02 DIAGNOSIS — R2681 Unsteadiness on feet: Secondary | ICD-10-CM

## 2018-06-02 DIAGNOSIS — M2560 Stiffness of unspecified joint, not elsewhere classified: Secondary | ICD-10-CM

## 2018-06-02 DIAGNOSIS — M6281 Muscle weakness (generalized): Secondary | ICD-10-CM

## 2018-06-02 DIAGNOSIS — M545 Low back pain, unspecified: Secondary | ICD-10-CM

## 2018-06-02 DIAGNOSIS — M79604 Pain in right leg: Secondary | ICD-10-CM

## 2018-06-02 DIAGNOSIS — R29898 Other symptoms and signs involving the musculoskeletal system: Secondary | ICD-10-CM | POA: Diagnosis not present

## 2018-06-02 DIAGNOSIS — R269 Unspecified abnormalities of gait and mobility: Secondary | ICD-10-CM

## 2018-06-02 NOTE — Therapy (Signed)
[place] MAIN [facility] SERVICES [address] [place], [place], [address] Phone: [phone]   Fax:  [phone]  Physical Therapy Treatment  Patient Details  Name: JILLAYNE WITTE MRN: [id number] Date of Birth: 1945/10/09 Referring Provider (PT): Maryclare Labrador [place], [place]   Encounter Date: 06/02/2018  PT End of Session - 06/02/18 1545    Visit Number  11    Number of Visits  34    Date for PT Re-Evaluation  07/14/18    Authorization Type  11    Authorization Time Period  of 10 progress report    PT Start Time  1445    PT Stop Time  1530    PT Time Calculation (min)  45 min    Activity Tolerance  Patient tolerated treatment well;Patient limited by fatigue    Behavior During Therapy  [facility] for tasks assessed/performed       Past Medical History:  Diagnosis Date  . Arthritis   . Depression     Past Surgical History:  Procedure Laterality Date  . ABDOMINAL HYSTERECTOMY  1995  . back sugery    . BUNIONECTOMY  2013   rt foot  . COLONOSCOPY    . MUSCLE BIOPSY Left 10/03/2012   Procedure: LEFT QUADRICEP MUSCLE BIOPSY;  Surgeon: Odis Hollingshead, MD;  Location: [place];  Service: General;  Laterality: Left;  . NECK SURGERY  2010   cerv disc fused     There were no vitals filed for this visit.  Subjective Assessment - 06/02/18 1542    Subjective  Pt arrived 25 min late for appt; however, seen for full appt. Pt reports feeling good after last session. Mild L knee soreness this morning (pt feels from relying on LLE more); L knee feels better in the water.     Pertinent History  LE weakness.  Symptoms occured suddenly, unknown method of injury prior to her first neck fusion surgery on January 2010. Had lower back surgery fusion in 2011.  The neck and back surgeries did not help. Pt states having increased urinary urgency and takes medication for for it. MD aware.  Denies saddle anesthesia.  Pt states that her doctor told her that PT  is the only thing that is going to help her keep moving so she continues to participate in PT.  Last round of PT was last year which helped.  Currently has difficulty walking, performing chores (wash dishes, laundry), cooking. Better able to do her tasks a little bit when she does therapy but gets harder when she stops.  Feels burning and stinging in both her knees, and bilateral anterior and lateral legs.  Pt states not having back or neck pain. Just a stiff neck.  Pt states she usually walks with a cane on her R side. No falls within the last 6 months.         Ambulation, 2# ankle wts  4 L fwd  4 L side  Core with LE, 2# ankle wts, B 25x ea  hip abd/add  hip flex/ext  hip openers  Bench; core/LE, 2# wts  Bike 5 min  Up and outs, B 20x ea  Mod plank hip ext, B 2 x 10 ea  Step ups  1 min lead R  1 min lead L  Bkwd walking (full steps for glute activation), 2 L Min guard for balance  Hip ext at wall to simulate/educate performance at home (stressed no wt at home, at  this time)  Active stretching 3x ea  Ham/gastroc  Hip flexor/quad                        PT Education - 06/02/18 1544    Education provided  Yes    Education Details  Step up/downs; hip ext in mod plank form. Education on hip ext to be performed at home standing at bathroom/kitchen counter. Bkwd ambulation       PT Short Term Goals - 05/18/18 1247      PT SHORT TERM GOAL #1   Title  Patient will be independent with her HEP to improve strength and function.     Time  3    Period  Weeks    Status  On-going    Target Date  04/14/18        PT Long Term Goals - 05/18/18 1247      PT LONG TERM GOAL #1   Title  Patient will improve B LE strength by at least 1/2 MMT grade to help decrease knee pain, improve ability to ambulate, perform standing tasks.     Time  8    Period  Weeks    Status  Partially Met    Target Date  07/14/18      PT LONG TERM GOAL #2   Title  Patient will improve  her LEFS score by at least 10 points as a demonstration of improved function.     Baseline  27/80 (03/21/2018)    Time  8    Period  Weeks    Status  On-going    Target Date  07/14/18      PT LONG TERM GOAL #3   Title  Patient will improve her 10 MWT speed with her SPC to at least 0.5 m/s to promote better community ambulation.     Baseline  0.39 m/s with her SPC (03/21/2018); 0.78 m/s (05/18/2018)    Time  8    Period  Weeks    Status  Partially Met    Target Date  07/14/18      PT LONG TERM GOAL #4   Title  Patient will have a decrease B knee pain to 4/10 or less at worst to promote ability to ambulate, perform standing tasks.     Baseline  9/10 B knee pain at most (03/21/2018); 5/10 B knee pain at worst for the past 7 days (05/18/2018)    Time  8    Period  Weeks    Status  Partially Met    Target Date  07/14/18            Plan - 06/02/18 1546    Clinical Impression Statement  Pt tolerated session well today. Cues required to avoid trunk flexion to compensate for hip weakness. Encouraged core engagement and working in range allowable with proper form. Improved with cues. Increased time spent with posterior chain strengthening and encouraged follow through at home as well. Continue PT to progress strength and balance to improve ambulation and all functional mobility    Rehab Potential  Fair    Clinical Impairments Affecting Rehab Potential  Chronicity of condition, weakness    PT Frequency  2x / week    PT Duration  8 weeks    PT Treatment/Interventions  Electrical Stimulation;Aquatic Therapy;Ultrasound;Gait training;Functional mobility training;Therapeutic activities;Therapeutic exercise;Balance training;Neuromuscular re-education;Patient/family education;Manual techniques;Dry needling;Iontophoresis '4mg'$ /ml Dexamethasone    PT Next Visit Plan  aquatic therapy, manual techniques, LE strengthening,  modalities PRN    Consulted and Agree with Plan of Care  Patient       Patient  will benefit from skilled therapeutic intervention in order to improve the following deficits and impairments:  Pain, Abnormal gait, Decreased balance, Decreased range of motion, Decreased strength, Difficulty walking  Visit Diagnosis: Difficulty in walking, not elsewhere classified  Decreased ROM of lumbar spine  Pain in right leg  Joint stiffness of spine  Bilateral low back pain without sciatica, unspecified chronicity  Pain in left leg  Other disturbances of skin sensation  Abnormality of gait  Muscle weakness (generalized)  Left knee pain, unspecified chronicity  Weakness of both legs  Unsteadiness  Unsteadiness on feet  Right knee pain, unspecified chronicity  Other abnormalities of gait and mobility     Problem List Patient Active Problem List   Diagnosis Date Noted  . Weakness 11/03/2012    Larae Grooms 06/02/2018, 3:50 PM  [place] MAIN [facility] SERVICES [address] [place], [place], [address] Phone: [phone]   Fax:  [phone]  Name: YEILA MORRO MRN: [id number] Date of Birth: 04-Nov-1945

## 2018-06-07 ENCOUNTER — Ambulatory Visit: Payer: Medicare Other

## 2018-06-09 ENCOUNTER — Ambulatory Visit

## 2018-06-13 NOTE — Therapy (Signed)
[place] [facility] [address], [place], [address] Phone: [phone]   Fax:  [phone]  Patient Details  Name: [name] MRN: [id number] Date of Birth: Dec 22, 1945 Referring Provider:  No ref. provider found  Encounter Date: 06/13/2018     Called patient and informed patient about current clinic closure for a minimum of 2 weeks due to the corona virus outbreak. Someone should be available to call patient to schedule more appointments if needed when the clinic re-opens again. Pt states that she needs more PT. Also interested in Telehealth or online PT if it becomes available while to clinic is closed. Reviewed current HEP in [place] with pt. Pt verbalized understanding.     [name] PT, DPT   06/13/2018, 4:10 PM  [place] [facility] [address], [place], [address] Phone: [phone]   Fax:  [phone]

## 2018-06-14 ENCOUNTER — Ambulatory Visit

## 2018-06-16 ENCOUNTER — Ambulatory Visit

## 2018-06-21 ENCOUNTER — Ambulatory Visit

## 2018-06-23 ENCOUNTER — Ambulatory Visit

## 2018-06-27 ENCOUNTER — Encounter

## 2018-07-06 NOTE — Therapy (Signed)
[place] [facility] [address] [place], [address] Phone: [phone]   Fax:  [phone]  Patient Details  Name: [name] MRN: [id number] Date of Birth: 01/20/1946 Referring Provider:  No ref. provider found  Encounter Date: 07/06/2018   The patient has been contacted today in regards to telehealth services. The patient expressed an interest in participating in telehealth visits when her insurance begins to cover it.  Patient has been informed that an [facility] support representative will be reaching out to them to verify their insurance benefits and for scheduling when appropriate.        [name] PT, DPT  07/06/2018, 2:03 PM  [place] [facility] [address], [place], [address] Phone: [phone]   Fax:  [phone]

## 2018-08-17 ENCOUNTER — Encounter

## 2018-08-17 ENCOUNTER — Telehealth: Payer: Self-pay

## 2018-08-17 NOTE — Telephone Encounter (Signed)
Returned pt phone call and called to check up on her to see how she is doing. Return phone call number provided [phone])

## 2018-08-22 ENCOUNTER — Encounter

## 2018-08-24 ENCOUNTER — Encounter

## 2018-08-29 ENCOUNTER — Encounter

## 2018-08-29 ENCOUNTER — Telehealth: Payer: Self-pay

## 2018-08-29 NOTE — Telephone Encounter (Signed)
Pt called clinic back (someone called her pertaining to Telehealth before but pt said that she was not able to answer the phone). Pt said that she decided not to do telehealth because she does not know how to set that up on her computer and the family members who know how to do that live in [place], [place]. Wants to set up follow up appointments in the clinic since everyone is wearing masks and feels better about that because she feels like treatment in the clinic is more effective than doing the exercises herself at home. Her legs feel like they are stiff and burning again.

## 2018-08-31 ENCOUNTER — Ambulatory Visit: Payer: Medicare Other | Attending: Physician Assistant

## 2018-08-31 ENCOUNTER — Other Ambulatory Visit: Payer: Self-pay

## 2018-08-31 ENCOUNTER — Encounter

## 2018-08-31 DIAGNOSIS — R2681 Unsteadiness on feet: Secondary | ICD-10-CM

## 2018-08-31 DIAGNOSIS — M25561 Pain in right knee: Secondary | ICD-10-CM | POA: Diagnosis not present

## 2018-08-31 DIAGNOSIS — R262 Difficulty in walking, not elsewhere classified: Secondary | ICD-10-CM | POA: Insufficient documentation

## 2018-08-31 DIAGNOSIS — M79605 Pain in left leg: Secondary | ICD-10-CM | POA: Insufficient documentation

## 2018-08-31 DIAGNOSIS — M6281 Muscle weakness (generalized): Secondary | ICD-10-CM | POA: Insufficient documentation

## 2018-08-31 DIAGNOSIS — M545 Low back pain, unspecified: Secondary | ICD-10-CM

## 2018-08-31 DIAGNOSIS — M79604 Pain in right leg: Secondary | ICD-10-CM | POA: Diagnosis not present

## 2018-08-31 DIAGNOSIS — M25562 Pain in left knee: Secondary | ICD-10-CM | POA: Diagnosis not present

## 2018-08-31 NOTE — Therapy (Signed)
[place] [facility] [address] [place], [address] Phone: [phone]   Fax:  [phone]  Physical Therapy Treatment And Progress Report (05/31/2018 to 08/31/2018)  Patient Details  Name: [name] MRN: [id number] Date of Birth: 1945/09/13 Referring Provider (PT): Maryclare Labrador [place], [place]   Encounter Date: 08/31/2018  PT End of Session - 08/31/18 1407    Visit Number  12    Number of Visits  50    Date for PT Re-Evaluation  10/27/18    Authorization Type  3    Authorization Time Period  of 10 progress report    PT Start Time  1407    PT Stop Time  1447    PT Time Calculation (min)  40 min    Activity Tolerance  Patient tolerated treatment well;Patient limited by fatigue    Behavior During Therapy  [facility] for tasks assessed/performed       Past Medical History:  Diagnosis Date  . Arthritis   . Depression     Past Surgical History:  Procedure Laterality Date  . ABDOMINAL HYSTERECTOMY  1995  . back sugery    . BUNIONECTOMY  2013   rt foot  . COLONOSCOPY    . MUSCLE BIOPSY Left 10/03/2012   Procedure: LEFT QUADRICEP MUSCLE BIOPSY;  Surgeon: Odis Hollingshead, MD;  Location: [place];  Service: General;  Laterality: Left;  . NECK SURGERY  2010   cerv disc fused     There were no vitals filed for this visit.  Subjective Assessment - 08/31/18 1410    Subjective  3/10 R knee burning sensation (8/10 pain and burning at worst for the past month), 3/10 L knee burning currenlty, (8/10 pain and burning at worst for the past month). No falls recently. Knows about fall education.      Pertinent History  LE weakness.  Symptoms occured suddenly, unknown method of injury prior to her first neck fusion surgery on January 2010. Had lower back surgery fusion in 2011.  The neck and back surgeries did not help. Pt states having increased urinary urgency and takes medication for for it. MD aware.  Denies  saddle anesthesia.  Pt states that her doctor told her that PT is the only thing that is going to help her keep moving so she continues to participate in PT.  Last round of PT was last year which helped.  Currently has difficulty walking, performing chores (wash dishes, laundry), cooking. Better able to do her tasks a little bit when she does therapy but gets harder when she stops.  Feels burning and stinging in both her knees, and bilateral anterior and lateral legs.  Pt states not having back or neck pain. Just a stiff neck.  Pt states she usually walks with a cane on her R side. No falls within the last 6 months.      Currently in Pain?  Yes    Pain Score  3     Pain Location  Knee    Pain Orientation  Left;Right         [facility] PT Assessment - 08/31/18 1414      Observation/Other Assessments   Lower Extremity Functional Scale   22/80      Strength   Right Hip Flexion  2-/5    Right Hip Extension  4+/5   seated manually resisted   Right Hip ABduction  3+/5   seated clamshell position, hips less  than 90 degrees flexion   Left Hip Flexion  4/5    Left Hip Extension  5/5   seated manually resisted   Left Hip ABduction  4-/5   seated clamshell position, hips less than 90 degrees flexion   Right Knee Flexion  4-/5    Right Knee Extension  4+/5    Left Knee Flexion  4-/5    Left Knee Extension  5/5    Right Ankle Dorsiflexion  4-/5   at available range; stiff end feel   Left Ankle Dorsiflexion  5/5                           PT Education - 08/31/18 1509    Education provided  Yes    Education Details  ther-ex, plan of care    Person(s) Educated  Patient    Methods  Explanation;Demonstration;Tactile cues;Verbal cues    Comprehension  Verbalized understanding;Returned demonstration      Objectives  Pt states no hx of osteoporosis    Med[employer] Access Code: [email or web address]    Therapeutic exercise  Seated manually resisted hip flexion, knee extension,  knee flexion, clamshell, hip extension 1-2x each way for each L (except R hip flexion)             Reviewed progress/current status with strength with pt .  Gait x 10 meters with SPC             17.29 seconds, 16.20 seconds.   (0.60 m/sec average)  Supine assist R SKTC with PT assist 10x   supine assisted R SKTC stretch with PT assist10x5 seconds for 2 sets  Supine SKTC active 2x  Reviewed plan of care  Improved exercise technique, movement at target joints, use of target muscles after min to mod verbal, visual, tactile cues.     Manual therapy   Supine R tibial IR grade 3- to help decrease R tibial ER  No R knee burning sensation in supine afterwards  Supine posterior glide R talocrural joint grade 3 to promote mobility and decrease stiffness.   Decreased stiffness palpated and slight decrease in R foot numbness.    Pt returns to physical therapy after the clinic reopened from closure. Pt mentioned in a phone conversation 2 days ago that she would like to continue in person physical therapy treatment secondary to feeling like it is more effective than doing the exercises herself at home and does not know how to set up telehealth for physical therapy at home. Feels better going into the clinic since everyone is wearing masks. Pt currently demonstrates decreased B LE strength, gait speed, and function as well as increased B knee pain and leg symptoms since last measured prior to COVID-19 clinic shut down. Decreased R knee pain and R foot paresthesia after session. Pt will benefit from continued skilled physical therapy services to improve LE strength, femoral control, function, decrease pain, and improve ability to ambulate.        PT Short Term Goals - 05/18/18 1247      PT SHORT TERM GOAL #1   Title  Patient will be independent with her HEP to improve strength and function.     Time  3    Period  Weeks    Status  On-going    Target Date  04/14/18        PT Long Term  Goals - 08/31/18 1513      PT LONG TERM GOAL #  1   Title  Patient will improve B LE strength by at least 1/2 MMT grade to help decrease knee pain, improve ability to ambulate, perform standing tasks.     Time  8    Period  Weeks    Status  On-going    Target Date  10/27/18      PT LONG TERM GOAL #2   Title  Patient will improve her LEFS score by at least 10 points as a demonstration of improved function.     Baseline  27/80 (03/21/2018); 22/80 (08/31/2018)    Time  8    Period  Weeks    Status  On-going    Target Date  10/27/18      PT LONG TERM GOAL #3   Title  Patient will improve her 10 MWT speed with her SPC to at least 0.5 m/s to promote better community ambulation.     Baseline  0.39 m/s with her SPC (03/21/2018); 0.78 m/s (05/18/2018); 0.60 m/sec average with SPC (08/31/2018)    Time  8    Period  Weeks    Status  On-going    Target Date  10/27/18      PT LONG TERM GOAL #4   Title  Patient will have a decrease B knee pain to 4/10 or less at worst to promote ability to ambulate, perform standing tasks.     Baseline  9/10 B knee pain at most (03/21/2018); 5/10 B knee pain at worst for the past 7 days (05/18/2018); 8/10 B knee pain and burning at worst for the past month (08/31/2018)    Time  8    Period  Weeks    Status  On-going    Target Date  10/27/18            Plan - 08/31/18 1509    Clinical Impression Statement  Pt returns to physical therapy after the clinic reopened from closure. Pt mentioned in a phone conversation 2 days ago that she would like to continue in person physical therapy treatment secondary to feeling like it is more effective than doing the exercises herself at home and does not know how to set up telehealth for physical therapy at home. Feels better going into the clinic since everyone is wearing masks. Pt currently demonstrates decreased B LE strength, gait speed, and function as well as increased B knee pain and leg symptoms since last measured prior  to COVID-19 clinic shut down. Decreased R knee pain and R foot paresthesia after session. Pt will benefit from continued skilled physical therapy services to improve LE strength, femoral control, function, decrease pain, and improve ability to ambulate.     Personal Factors and Comorbidities  Age;Comorbidity 3+;Time since onset of injury/illness/exacerbation;Past/Current Experience    Comorbidities  Depression, arthritis, neck surgery, abdominal hysterectomy, back surgery    Examination-Activity Limitations  Carry;Locomotion Level;Squat;Stairs    Stability/Clinical Decision Making  Evolving/Moderate complexity    Clinical Decision Making  Moderate    Clinical Presentation due to:  decreased strength, increased B knee pain    Rehab Potential  Fair    Clinical Impairments Affecting Rehab Potential  Chronicity of condition, weakness, varying levels of motivation    PT Frequency  2x / week    PT Duration  8 weeks    PT Treatment/Interventions  Electrical Stimulation;Aquatic Therapy;Ultrasound;Gait training;Functional mobility training;Therapeutic activities;Therapeutic exercise;Balance training;Neuromuscular re-education;Patient/family education;Manual techniques;Dry needling;Iontophoresis 4mg /ml Dexamethasone    PT Next Visit Plan  aquatic therapy (when able), manual  techniques, LE strengthening, modalities PRN    Consulted and Agree with Plan of Care  Patient       Patient will benefit from skilled therapeutic intervention in order to improve the following deficits and impairments:  Pain, Abnormal gait, Decreased balance, Decreased range of motion, Decreased strength, Difficulty walking  Visit Diagnosis: Difficulty in walking, not elsewhere classified - Plan: PT plan of care cert/re-cert  Pain in right leg - Plan: PT plan of care cert/re-cert  Bilateral low back pain without sciatica, unspecified chronicity - Plan: PT plan of care cert/re-cert  Pain in left leg - Plan: PT plan of care  cert/re-cert  Muscle weakness (generalized) - Plan: PT plan of care cert/re-cert  Left knee pain, unspecified chronicity - Plan: PT plan of care cert/re-cert  Unsteadiness on feet - Plan: PT plan of care cert/re-cert  Right knee pain, unspecified chronicity - Plan: PT plan of care cert/re-cert     Problem List Patient Active Problem List   Diagnosis Date Noted  . Weakness 11/03/2012   Thank you for your referral.  [name] PT, DPT   08/31/2018, 3:50 PM  [place] [facility] [address] [place], [address] Phone: [phone]   Fax:  [phone]  Name: [name] MRN: [id number] Date of Birth: [birth year]

## 2018-09-05 ENCOUNTER — Encounter

## 2018-09-06 ENCOUNTER — Ambulatory Visit: Payer: Medicare Other

## 2018-09-07 ENCOUNTER — Encounter

## 2018-09-08 ENCOUNTER — Other Ambulatory Visit: Payer: Self-pay

## 2018-09-08 ENCOUNTER — Ambulatory Visit: Payer: Medicare Other

## 2018-09-08 DIAGNOSIS — R2681 Unsteadiness on feet: Secondary | ICD-10-CM

## 2018-09-08 DIAGNOSIS — R262 Difficulty in walking, not elsewhere classified: Secondary | ICD-10-CM

## 2018-09-08 DIAGNOSIS — M25562 Pain in left knee: Secondary | ICD-10-CM

## 2018-09-08 DIAGNOSIS — M79604 Pain in right leg: Secondary | ICD-10-CM | POA: Diagnosis not present

## 2018-09-08 DIAGNOSIS — M6281 Muscle weakness (generalized): Secondary | ICD-10-CM | POA: Diagnosis not present

## 2018-09-08 DIAGNOSIS — M545 Low back pain: Secondary | ICD-10-CM | POA: Diagnosis not present

## 2018-09-08 DIAGNOSIS — M79605 Pain in left leg: Secondary | ICD-10-CM | POA: Diagnosis not present

## 2018-09-08 DIAGNOSIS — M25561 Pain in right knee: Secondary | ICD-10-CM

## 2018-09-08 NOTE — Therapy (Signed)
[facility] [address] [place], [address] Phone: [phone]   Fax:  [phone]  Physical Therapy Treatment  Patient Details  Name: [name] MRN: [id number] Date of Birth: 11-Jun-1945 Referring Provider (PT): Maryclare Labrador [place], [place]   Encounter Date: 09/08/2018  PT End of Session - 09/08/18 1511    Visit Number  13    Number of Visits  50    Date for PT Re-Evaluation  10/27/18    Authorization Type  2    Authorization Time Period  of 10 progress report    PT Start Time  1510   pt arrived late   PT Stop Time  1552    PT Time Calculation (min)  42 min    Activity Tolerance  Patient tolerated treatment well;Patient limited by fatigue    Behavior During Therapy  [facility] for tasks assessed/performed       Past Medical History:  Diagnosis Date  . Arthritis   . Depression     Past Surgical History:  Procedure Laterality Date  . ABDOMINAL HYSTERECTOMY  1995  . back sugery    . BUNIONECTOMY  2013   rt foot  . COLONOSCOPY    . MUSCLE BIOPSY Left 10/03/2012   Procedure: LEFT QUADRICEP MUSCLE BIOPSY;  Surgeon: Odis Hollingshead, MD;  Location: [facility];  Service: General;  Laterality: Left;  . NECK SURGERY  2010   cerv disc fused     There were no vitals filed for this visit.  Subjective Assessment - 09/08/18 1512    Subjective  5/10 B knee burning sensation currently. The burning in the legs are not bad    Pertinent History  LE weakness.  Symptoms occured suddenly, unknown method of injury prior to her first neck fusion surgery on January 2010. Had lower back surgery fusion in 2011.  The neck and back surgeries did not help. Pt states having increased urinary urgency and takes medication for for it. MD aware.  Denies saddle anesthesia.  Pt states that her doctor told her that PT is the only thing that is going to help her keep moving so she continues to participate in PT.  Last round of  PT was last year which helped.  Currently has difficulty walking, performing chores (wash dishes, laundry), cooking. Better able to do her tasks a little bit when she does therapy but gets harder when she stops.  Feels burning and stinging in both her knees, and bilateral anterior and lateral legs.  Pt states not having back or neck pain. Just a stiff neck.  Pt states she usually walks with a cane on her R side. No falls within the last 6 months.      Currently in Pain?  Yes    Pain Score  5                                PT Education - 09/08/18 1804    Education provided  Yes    Education Details  ther-ex    Person(s) Educated  Patient    Methods  Explanation;Demonstration;Tactile cues;Verbal cues    Comprehension  Returned demonstration;Verbalized understanding         Objectives  Pt states no hx of osteoporosis    MedBridge Access Code: [email or web address]    Manual therapy Seated STM R lateral hamstrings,  IT band Supine A to  P to R ankle joint grade 3. Decreased dorsal foot numbness    Therapeutic exercise   Supine R ankle DF AAROM to end range 10x5 seconds  Supine manually resisted R ankle EV 10x Supine manually resisted IV 10x Supine manually resisted R ankle PF 10x  Ankle circles given as part of HEP. Pt demonstrated and verbalized understanding   Supine R SKTC 10x5 seconds with PT assist  Supine R LE manually resisted leg press with PT 10x2  Supine R hip flexion SLR with PT assist 5x  Then without PT assist 5x. Pt able to perform herself   Seated manually resisted clamshell isometrics, hips less than 90 degrees flexion 10x5 seconds for 2 sets  Seated trunk flexion. Decreased sensation of L posterior thigh cramp   Improved exercise technique, movement at target joints, use of target muscles after min to mod verbal, visual, tactile cues.    Response to treatment Decreased R knee pain and R dorsal foot numbness after manual  therapy. Improved R foot clearance during swing phase of gait. Good muscle use felt with exercises. Pt tolerated session well without aggravation of symptoms.    Clinical impression Improving R hip flexor muscle activation with supine hip flexion exercises observed. Continued working on decreasing R lateral hamstrings, IT band muscle tension to decrease knee pain, improve R ankle joint mobility to promote ankle DF ROM and decrease numbness, and improving R LE strength to promote function and decrease difficulty with gait. Decreased R dorsal foot numbness with A to P ankle joint mobilization. Decreased R knee pain after manual therapy. Improved R foot clearance during R LE swing phase of gait after session. Pt will benefit from continued skilled physical therapy services to decrease pain, improve strength, function, and decrease difficulty with gait.          PT Short Term Goals - 05/18/18 1247      PT SHORT TERM GOAL #1   Title  Patient will be independent with her HEP to improve strength and function.     Time  3    Period  Weeks    Status  On-going    Target Date  04/14/18        PT Long Term Goals - 08/31/18 1513      PT LONG TERM GOAL #1   Title  Patient will improve B LE strength by at least 1/2 MMT grade to help decrease knee pain, improve ability to ambulate, perform standing tasks.     Time  8    Period  Weeks    Status  On-going    Target Date  10/27/18      PT LONG TERM GOAL #2   Title  Patient will improve her LEFS score by at least 10 points as a demonstration of improved function.     Baseline  27/80 (03/21/2018); 22/80 (08/31/2018)    Time  8    Period  Weeks    Status  On-going    Target Date  10/27/18      PT LONG TERM GOAL #3   Title  Patient will improve her 10 MWT speed with her SPC to at least 0.5 m/s to promote better community ambulation.     Baseline  0.39 m/s with her SPC (03/21/2018); 0.78 m/s (05/18/2018); 0.60 m/sec average with SPC (08/31/2018)     Time  8    Period  Weeks    Status  On-going    Target Date  10/27/18  PT LONG TERM GOAL #4   Title  Patient will have a decrease B knee pain to 4/10 or less at worst to promote ability to ambulate, perform standing tasks.     Baseline  9/10 B knee pain at most (03/21/2018); 5/10 B knee pain at worst for the past 7 days (05/18/2018); 8/10 B knee pain and burning at worst for the past month (08/31/2018)    Time  8    Period  Weeks    Status  On-going    Target Date  10/27/18            Plan - 09/08/18 1541    Clinical Impression Statement  Improving R hip flexor muscle activation with supine hip flexion exercises observed. Continued working on decreasing R lateral hamstrings, IT band muscle tension to decrease knee pain, improve R ankle joint mobility to promote ankle DF ROM and decrease numbness, and improving R LE strength to promote function and decrease difficulty with gait. Decreased R dorsal foot numbness with A to P ankle joint mobilization. Decreased R knee pain after manual therapy. Improved R foot clearance during R LE swing phase of gait after session. Pt will benefit from continued skilled physical therapy services to decrease pain, improve strength, function, and decrease difficulty with gait.    Personal Factors and Comorbidities  Age;Comorbidity 3+;Time since onset of injury/illness/exacerbation;Past/Current Experience    Comorbidities  Depression, arthritis, neck surgery, abdominal hysterectomy, back surgery    Examination-Activity Limitations  Carry;Locomotion Level;Squat;Stairs    Stability/Clinical Decision Making  Evolving/Moderate complexity    Rehab Potential  Fair    Clinical Impairments Affecting Rehab Potential  Chronicity of condition, weakness, varying levels of motivation    PT Frequency  2x / week    PT Duration  8 weeks    PT Treatment/Interventions  Electrical Stimulation;Aquatic Therapy;Ultrasound;Gait training;Functional mobility training;Therapeutic  activities;Therapeutic exercise;Balance training;Neuromuscular re-education;Patient/family education;Manual techniques;Dry needling;Iontophoresis 4mg /ml Dexamethasone    PT Next Visit Plan  aquatic therapy (when able), manual techniques, LE strengthening, modalities PRN    Consulted and Agree with Plan of Care  Patient       Patient will benefit from skilled therapeutic intervention in order to improve the following deficits and impairments:  Pain, Abnormal gait, Decreased balance, Decreased range of motion, Decreased strength, Difficulty walking  Visit Diagnosis: 1. Difficulty in walking, not elsewhere classified   2. Pain in right leg   3. Pain in left leg   4. Muscle weakness (generalized)   5. Left knee pain, unspecified chronicity   6. Unsteadiness on feet   7. Right knee pain, unspecified chronicity        Problem List Patient Active Problem List   Diagnosis Date Noted  . Weakness 11/03/2012    [name] PT, DPT   09/08/2018, 6:22 PM  [facility] [address] [place], [address] Phone: [phone]   Fax:  [phone]  Name: [name] MRN: [id number] Date of Birth: Apr 07, 1945

## 2018-09-08 NOTE — Patient Instructions (Signed)
Ankle circles given as part of HEP. Pt demonstrated and verbalized understanding

## 2018-09-12 ENCOUNTER — Encounter

## 2018-09-12 ENCOUNTER — Ambulatory Visit: Payer: Medicare Other | Admitting: Physical Therapy

## 2018-09-12 ENCOUNTER — Other Ambulatory Visit: Payer: Self-pay

## 2018-09-12 DIAGNOSIS — M545 Low back pain: Secondary | ICD-10-CM | POA: Diagnosis not present

## 2018-09-12 DIAGNOSIS — M25561 Pain in right knee: Secondary | ICD-10-CM

## 2018-09-12 DIAGNOSIS — M79604 Pain in right leg: Secondary | ICD-10-CM | POA: Diagnosis not present

## 2018-09-12 DIAGNOSIS — M6281 Muscle weakness (generalized): Secondary | ICD-10-CM | POA: Diagnosis not present

## 2018-09-12 DIAGNOSIS — M25562 Pain in left knee: Secondary | ICD-10-CM | POA: Diagnosis not present

## 2018-09-12 DIAGNOSIS — R262 Difficulty in walking, not elsewhere classified: Secondary | ICD-10-CM

## 2018-09-12 DIAGNOSIS — M79605 Pain in left leg: Secondary | ICD-10-CM | POA: Diagnosis not present

## 2018-09-12 DIAGNOSIS — R2681 Unsteadiness on feet: Secondary | ICD-10-CM

## 2018-09-12 NOTE — Therapy (Signed)
[facility] [address] [place], [address] Phone: [phone]   Fax:  [phone]  Physical Therapy Treatment  Patient Details  Name: [name] MRN: [id number] Date of Birth: [birth year] Referring Provider (PT): Maryclare Labrador [place], [place]   Encounter Date: 09/12/2018  PT End of Session - 09/12/18 1638    Visit Number  14    Number of Visits  50    Date for PT Re-Evaluation  10/27/18    Authorization Type  3    Authorization Time Period  of 10 progress report    PT Start Time  1445   patient late and states was told 2:30p when on sched for 2pm. Different therapist saw but took 5 min to sort out   PT Stop Time  1520    PT Time Calculation (min)  35 min    Activity Tolerance  Patient tolerated treatment well;Patient limited by fatigue    Behavior During Therapy  [facility] for tasks assessed/performed       Past Medical History:  Diagnosis Date  . Arthritis   . Depression     Past Surgical History:  Procedure Laterality Date  . ABDOMINAL HYSTERECTOMY  1995  . back sugery    . BUNIONECTOMY  2013   rt foot  . COLONOSCOPY    . MUSCLE BIOPSY Left 10/03/2012   Procedure: LEFT QUADRICEP MUSCLE BIOPSY;  Surgeon: Odis Hollingshead, MD;  Location: [facility];  Service: General;  Laterality: Left;  . NECK SURGERY  2010   cerv disc fused     There were no vitals filed for this visit.  Subjective Assessment - 09/12/18 1445    Subjective  Patient states she has 5/10 burning "not pain just burning" in R foot and half of it is numb. She enjoys haning out with grandchildren. She felt okay after last treatment sssion.    Pertinent History  LE weakness.  Symptoms occured suddenly, unknown method of injury prior to her first neck fusion surgery on January 2010. Had lower back surgery fusion in 2011.  The neck and back surgeries did not help. Pt states having increased urinary urgency and takes medication for  for it. MD aware.  Denies saddle anesthesia.  Pt states that her doctor told her that PT is the only thing that is going to help her keep moving so she continues to participate in PT.  Last round of PT was last year which helped.  Currently has difficulty walking, performing chores (wash dishes, laundry), cooking. Better able to do her tasks a little bit when she does therapy but gets harder when she stops.  Feels burning and stinging in both her knees, and bilateral anterior and lateral legs.  Pt states not having back or neck pain. Just a stiff neck.  Pt states she usually walks with a cane on her R side. No falls within the last 6 months.      Currently in Pain?  Yes    Pain Score  5          OBJECTIVE:  Pt states no hx of osteoporosis   MedBridge Access Code: [email or web address]   Therapeutic exercise: to centralize symptoms and improve ROM, strength, muscular endurance, and activity tolerance required for successful completion of functional activities.   Supine R ankle DF AAROM to end range 10x5 seconds  Supine manually resisted R ankle EV 10x Supine manually resisted IV 10x Supine manually resisted R  ankle PF 10x  BAPS board level II, FW/BW, IV/EV 2x10 each with manual assistance to keep knee still and verbal feedback about touching sides to gain complete ROM.   Supine R SKTC 10x5 seconds with PT assist  Supine R LE manually resisted leg press with PT 10x2  Supine R hip flexion SLR with clinician guarding and providing target x 10 (improving range each rep)  Hooklying manually resisted clamshell isometrics, hips less than 90 degrees flexion 10x5 seconds for 2 sets  Education on diagnosis, prognosis, POC, anatomy and physiology of current condition.   Improved exercise technique, movement at target joints, use of target muscles after multimodal verbal, visual, tactile cues.    PT Education - 09/12/18 1637    Education provided  Yes    Education Details  Exercise  purpose/form. Self management techniques. Education on diagnosis, prognosis, POC, anatomy and physiology of current condition    Person(s) Educated  Patient    Methods  Explanation;Demonstration;Tactile cues;Verbal cues    Comprehension  Verbalized understanding;Returned demonstration;Verbal cues required;Tactile cues required;Need further instruction       PT Short Term Goals - 05/18/18 1247      PT SHORT TERM GOAL #1   Title  Patient will be independent with her HEP to improve strength and function.     Time  3    Period  Weeks    Status  On-going    Target Date  04/14/18        PT Long Term Goals - 08/31/18 1513      PT LONG TERM GOAL #1   Title  Patient will improve B LE strength by at least 1/2 MMT grade to help decrease knee pain, improve ability to ambulate, perform standing tasks.     Time  8    Period  Weeks    Status  On-going    Target Date  10/27/18      PT LONG TERM GOAL #2   Title  Patient will improve her LEFS score by at least 10 points as a demonstration of improved function.     Baseline  27/80 (03/21/2018); 22/80 (08/31/2018)    Time  8    Period  Weeks    Status  On-going    Target Date  10/27/18      PT LONG TERM GOAL #3   Title  Patient will improve her 10 MWT speed with her SPC to at least 0.5 m/s to promote better community ambulation.     Baseline  0.39 m/s with her SPC (03/21/2018); 0.78 m/s (05/18/2018); 0.60 m/sec average with SPC (08/31/2018)    Time  8    Period  Weeks    Status  On-going    Target Date  10/27/18      PT LONG TERM GOAL #4   Title  Patient will have a decrease B knee pain to 4/10 or less at worst to promote ability to ambulate, perform standing tasks.     Baseline  9/10 B knee pain at most (03/21/2018); 5/10 B knee pain at worst for the past 7 days (05/18/2018); 8/10 B knee pain and burning at worst for the past month (08/31/2018)    Time  8    Period  Weeks    Status  On-going    Target Date  10/27/18            Plan  - 09/12/18 1636    Clinical Impression Statement  Pt tolerated treatment well. Pt was  able to complete all exercises with minimal to no lasting increase in pain or discomfort. She demonstrated quite a bit of difficulty activating ankle everters and inverters in supine, so BAPS board attempted to provide different feedback to patient to improve activation. Patient struggled with BAPS board at first but responded well to external cuing including saying "ouch" every time side of board hit clinician's fingers where she was supposed to keep edges off floor. Patient with much improved motions second set through with improved isolation of movement at the ankle. Patient also showed improved muscular activation and movement with repeated repetitions of SLR. Pt required multimodal cuing for proper technique and to facilitate improved neuromuscular control, strength, range of motion, and functional ability resulting in improved performance and form. Patient would benefit from continued physical therapy to address remaining impairments and functional limitations to work towards stated goals and return to PLOF or maximal functional independence.    Personal Factors and Comorbidities  Age;Comorbidity 3+;Time since onset of injury/illness/exacerbation;Past/Current Experience    Comorbidities  Depression, arthritis, neck surgery, abdominal hysterectomy, back surgery    Examination-Activity Limitations  Carry;Locomotion Level;Squat;Stairs    Stability/Clinical Decision Making  Evolving/Moderate complexity    Rehab Potential  Fair    Clinical Impairments Affecting Rehab Potential  Chronicity of condition, weakness, varying levels of motivation    PT Frequency  2x / week    PT Duration  8 weeks    PT Treatment/Interventions  Electrical Stimulation;Aquatic Therapy;Ultrasound;Gait training;Functional mobility training;Therapeutic activities;Therapeutic exercise;Balance training;Neuromuscular re-education;Patient/family  education;Manual techniques;Dry needling;Iontophoresis 4mg /ml Dexamethasone    PT Next Visit Plan  aquatic therapy (when able), manual techniques, LE strengthening, modalities PRN    PT Home Exercise Plan  LAQ, SLR, marching in sitting and standing, hip IR with GTB, knee F with GTB    Consulted and Agree with Plan of Care  Patient       Patient will benefit from skilled therapeutic intervention in order to improve the following deficits and impairments:  Pain, Abnormal gait, Decreased balance, Decreased range of motion, Decreased strength, Difficulty walking  Visit Diagnosis: 1. Difficulty in walking, not elsewhere classified   2. Pain in right leg   3. Pain in left leg   4. Muscle weakness (generalized)   5. Left knee pain, unspecified chronicity   6. Unsteadiness on feet   7. Right knee pain, unspecified chronicity        Problem List Patient Active Problem List   Diagnosis Date Noted  . Weakness 11/03/2012    Everlean Alstrom. Graylon Good, PT, DPT 09/12/18, 4:42 PM  [facility] [address] [place], [address] Phone: [phone]   Fax:  [phone]  Name: SANYIAH KANZLER MRN: [id number] Date of Birth: 12-07-1945

## 2018-09-14 ENCOUNTER — Encounter

## 2018-09-14 ENCOUNTER — Ambulatory Visit: Payer: Medicare Other | Admitting: Physical Therapy

## 2018-09-14 ENCOUNTER — Other Ambulatory Visit: Payer: Self-pay

## 2018-09-14 ENCOUNTER — Ambulatory Visit: Payer: Medicare Other

## 2018-09-14 ENCOUNTER — Encounter: Payer: Self-pay | Admitting: Physical Therapy

## 2018-09-14 DIAGNOSIS — R262 Difficulty in walking, not elsewhere classified: Secondary | ICD-10-CM

## 2018-09-14 DIAGNOSIS — M79605 Pain in left leg: Secondary | ICD-10-CM | POA: Diagnosis not present

## 2018-09-14 DIAGNOSIS — M6281 Muscle weakness (generalized): Secondary | ICD-10-CM | POA: Diagnosis not present

## 2018-09-14 DIAGNOSIS — M25562 Pain in left knee: Secondary | ICD-10-CM | POA: Diagnosis not present

## 2018-09-14 DIAGNOSIS — M545 Low back pain: Secondary | ICD-10-CM | POA: Diagnosis not present

## 2018-09-14 DIAGNOSIS — M79604 Pain in right leg: Secondary | ICD-10-CM | POA: Diagnosis not present

## 2018-09-14 DIAGNOSIS — M25561 Pain in right knee: Secondary | ICD-10-CM

## 2018-09-14 DIAGNOSIS — R2681 Unsteadiness on feet: Secondary | ICD-10-CM

## 2018-09-14 NOTE — Therapy (Signed)
[facility] [address], [place], [address] Phone: [phone]   Fax:  [phone]  Physical Therapy Treatment  Patient Details  Name: [name] MRN: [id number] Date of Birth: 03-12-1946 Referring Provider (PT): Maryclare Labrador [place], [place]   Encounter Date: 09/14/2018  PT End of Session - 09/14/18 1748    Visit Number  15    Number of Visits  50    Date for PT Re-Evaluation  10/27/18    Authorization Type  4    Authorization Time Period  of 10 progress report    PT Start Time  1640   patient 10 min late   PT Stop Time  1720   pt 10 min late   PT Time Calculation (min)  40 min    Activity Tolerance  Patient tolerated treatment well;Patient limited by fatigue    Behavior During Therapy  [facility] for tasks assessed/performed       Past Medical History:  Diagnosis Date  . Arthritis   . Depression     Past Surgical History:  Procedure Laterality Date  . ABDOMINAL HYSTERECTOMY  1995  . back sugery    . BUNIONECTOMY  2013   rt foot  . COLONOSCOPY    . MUSCLE BIOPSY Left 10/03/2012   Procedure: LEFT QUADRICEP MUSCLE BIOPSY;  Surgeon: Odis Hollingshead, MD;  Location: [facility];  Service: General;  Laterality: Left;  . NECK SURGERY  2010   cerv disc fused     There were no vitals filed for this visit.  Subjective Assessment - 09/14/18 1646    Subjective  Patient reports her knees are not burning as much today upon arrival. She rates it 3/10. She continues to have the numbness in the R foot. She states she was "good sore" after last treatment session but no excessive soreness. She reports her son has a camera on her door as part of [employer] door[employer] and her son was watching her get back into the house following her last session and said it was much better than usual. Reports her goal is to walk straight again. Denies being diagnosed with MS and states upcoming procedure for drooping eyelid is not  related to any medical condition such as myasthenia gravis.    Pertinent History  LE weakness.  Symptoms occured suddenly, unknown method of injury prior to her first neck fusion surgery on January 2010. Had lower back surgery fusion in 2011.  The neck and back surgeries did not help. Pt states having increased urinary urgency and takes medication for for it. MD aware.  Denies saddle anesthesia.  Pt states that her doctor told her that PT is the only thing that is going to help her keep moving so she continues to participate in PT.  Last round of PT was last year which helped.  Currently has difficulty walking, performing chores (wash dishes, laundry), cooking. Better able to do her tasks a little bit when she does therapy but gets harder when she stops.  Feels burning and stinging in both her knees, and bilateral anterior and lateral legs.  Pt states not having back or neck pain. Just a stiff neck.  Pt states she usually walks with a cane on her R side. No falls within the last 6 months.      Currently in Pain?  Yes    Pain Score  3     Pain Location  Knee  Pain Orientation  Right;Left    Pain Descriptors / Indicators  Burning          OBJECTIVE:  TREATMENT:  Imaging reports mention decreased bone mineralization in the spine.   Therapeutic exercise: to centralize symptoms and improve ROM, strength, muscular endurance, and activity tolerance required for successful completion of functional activities.  - NuStep level 2 using bilateral upper and lower extremities.  Seat/handle setting 11. For improved extremity mobility, muscular endurance, and activity tolerance; and to induce the analgesic effect of aerobic exercise, stimulate improved joint nutrition, and prepare body structures and systems for following interventions. x 5  Minutes during subjective exam. Required min A assistance to get on/off and set up machine.  - BAPS board level II, FW/BW, IV/EV 2x20 each then circles CW and CCW x 20  each with manual assistance to keep knee still and verbal feedback about touching sides to gain complete ROM.  - Supine R ankle DF AAROM to end range 10x5 seconds  - Supine manually resisted R ankle EV 10x - Supine R SKTC AAROM as needed with end range OP to manually resisted leg press with clinician assist. 2x10. Attempted to transition to resisted hip flexion but unable.  - Hooklying bridge to improve hip extension, hamstring, and quad strength. Required cuing for maximal ROM. 2x10 improved ROM with each rep - Supine R hip flexion SLR with clinician guarding and providing target 2x 10 (improving range each rep) - Hooklying manually resisted clamshell isometrics, hips less than 90 degrees flexion 10x5 seconds for 2 sets - supine heel slides x 10 with clinician assistance for first 5 reps of 10 before she could complete on her own. Noted for strong tibialis anterior dominant dorsiflexion and inversion when attempting to pull heel closer to buttocks.  - Education on diagnosis, prognosis, POC, anatomy and physiology of current condition.  - Education on HEP including handout   Improved exercise technique, movement at target joints, use of target muscles aftermultimodal verbal, visual, tactile cues.  HOME EXERCISE PROGRAM Access Code: [email or web address]  URL: [email or web address]  Date: 09/14/2018  Prepared by: [name]   Exercises  Supine Straight Leg Raises - 10 reps - 3 sets - 1x daily - 7x weekly  Supine Heel Slide - 3 sets - 10 reps - 1x daily - 7x weekly  Supine Bridge - 3 sets - 10 reps - 1x daily - 7x weekly     PT Education - 09/14/18 1752    Education provided  Yes    Education Details  Exercise purpose/form. Self management techniques. Education on diagnosis, prognosis, POC, anatomy and physiology of current condition. HEP with handout    Person(s) Educated  Patient    Methods  Explanation;Demonstration;Tactile cues;Verbal cues;Handout    Comprehension  Verbalized  understanding;Returned demonstration;Verbal cues required;Tactile cues required       PT Short Term Goals - 05/18/18 1247      PT SHORT TERM GOAL #1   Title  Patient will be independent with her HEP to improve strength and function.     Time  3    Period  Weeks    Status  On-going    Target Date  04/14/18        PT Long Term Goals - 08/31/18 1513      PT LONG TERM GOAL #1   Title  Patient will improve B LE strength by at least 1/2 MMT grade to help decrease knee pain, improve ability to ambulate, perform standing tasks.  Time  8    Period  Weeks    Status  On-going    Target Date  10/27/18      PT LONG TERM GOAL #2   Title  Patient will improve her LEFS score by at least 10 points as a demonstration of improved function.     Baseline  27/80 (03/21/2018); 22/80 (08/31/2018)    Time  8    Period  Weeks    Status  On-going    Target Date  10/27/18      PT LONG TERM GOAL #3   Title  Patient will improve her 10 MWT speed with her SPC to at least 0.5 m/s to promote better community ambulation.     Baseline  0.39 m/s with her SPC (03/21/2018); 0.78 m/s (05/18/2018); 0.60 m/sec average with SPC (08/31/2018)    Time  8    Period  Weeks    Status  On-going    Target Date  10/27/18      PT LONG TERM GOAL #4   Title  Patient will have a decrease B knee pain to 4/10 or less at worst to promote ability to ambulate, perform standing tasks.     Baseline  9/10 B knee pain at most (03/21/2018); 5/10 B knee pain at worst for the past 7 days (05/18/2018); 8/10 B knee pain and burning at worst for the past month (08/31/2018)    Time  8    Period  Weeks    Status  On-going    Target Date  10/27/18            Plan - 09/14/18 1749    Clinical Impression Statement  Patient tolerated treatment well with no overall increase in pain. She demonstrated improved ankle activation compared to last visit and was able to progress to more exercises and increased reps this visit. She appreciated  the workout today but did not seem as excited at the end as she did at her previous session. Based on motor behavior, it appears she has altered tone in in the R LE suggesting central nervous system source of symptoms, with extension pattern spasticity and flexion pattern weakness. Pt required multimodal cuing for proper technique and to facilitate improved neuromuscular control, strength, range of motion, and functional ability resulting in improved performance and form. Patient would benefit from continued physical therapy to address remaining impairments and functional limitations to work towards stated goals and return to PLOF or maximal functional independence.    Personal Factors and Comorbidities  Age;Comorbidity 3+;Time since onset of injury/illness/exacerbation;Past/Current Experience    Comorbidities  Depression, arthritis, neck surgery, abdominal hysterectomy, back surgery    Examination-Activity Limitations  Carry;Locomotion Level;Squat;Stairs    Stability/Clinical Decision Making  Evolving/Moderate complexity    Rehab Potential  Fair    Clinical Impairments Affecting Rehab Potential  Chronicity of condition, weakness, varying levels of motivation    PT Frequency  2x / week    PT Duration  8 weeks    PT Treatment/Interventions  Electrical Stimulation;Aquatic Therapy;Ultrasound;Gait training;Functional mobility training;Therapeutic activities;Therapeutic exercise;Balance training;Neuromuscular re-education;Patient/family education;Manual techniques;Dry needling;Iontophoresis 4mg /ml Dexamethasone    PT Next Visit Plan  aquatic therapy (when able), PNF and funcitonal and LE strengthening, focus on R    [place] Access Code: [email or web address]    Consulted and Agree with Plan of Care  Patient       Patient will benefit from skilled therapeutic intervention in order to improve the following deficits and impairments:  Pain, Abnormal gait, Decreased balance, Decreased range of  motion, Decreased strength, Difficulty walking  Visit Diagnosis: 1. Difficulty in walking, not elsewhere classified   2. Pain in right leg   3. Pain in left leg   4. Muscle weakness (generalized)   5. Left knee pain, unspecified chronicity   6. Unsteadiness on feet   7. Right knee pain, unspecified chronicity        Problem List Patient Active Problem List   Diagnosis Date Noted  . Weakness 11/03/2012    Everlean Alstrom. Graylon Good, PT, DPT 09/14/18, 5:53 PM  [facility] [address] [place], [address] Phone: [phone]   Fax:  [phone]  Name: [name] MRN: [id number] Date of Birth: 1945/07/22

## 2018-09-19 ENCOUNTER — Encounter

## 2018-09-20 ENCOUNTER — Ambulatory Visit: Payer: Medicare Other | Admitting: Physical Therapy

## 2018-09-20 ENCOUNTER — Ambulatory Visit: Payer: Medicare Other

## 2018-09-20 DIAGNOSIS — M25561 Pain in right knee: Secondary | ICD-10-CM | POA: Diagnosis not present

## 2018-09-20 DIAGNOSIS — R768 Other specified abnormal immunological findings in serum: Secondary | ICD-10-CM | POA: Diagnosis not present

## 2018-09-20 DIAGNOSIS — M25461 Effusion, right knee: Secondary | ICD-10-CM | POA: Diagnosis not present

## 2018-09-20 DIAGNOSIS — M1180 Other specified crystal arthropathies, unspecified site: Secondary | ICD-10-CM | POA: Diagnosis not present

## 2018-09-20 DIAGNOSIS — M170 Bilateral primary osteoarthritis of knee: Secondary | ICD-10-CM | POA: Diagnosis not present

## 2018-09-20 DIAGNOSIS — M25562 Pain in left knee: Secondary | ICD-10-CM | POA: Diagnosis not present

## 2018-09-20 NOTE — Therapy (Signed)
[facility] [address], [place], [address] Phone: [phone]   Fax:  [phone]  Patient Details  Name: [name] MRN: [id number] Date of Birth: 1945-11-05 Referring Provider:  Wilburt Finlay  Encounter Date: 12/21/2017    Patient cancelled session this date and chart was opened by [facility] in preparation for session.  Note was run to remove chart from open chart list.     [name] 09/20/2018, 9:44 AM  [facility] [address] [place], [address] Phone: [phone]   Fax:  [phone]

## 2018-09-22 ENCOUNTER — Ambulatory Visit: Payer: Medicare Other | Admitting: Physical Therapy

## 2018-09-22 ENCOUNTER — Ambulatory Visit: Payer: Medicare Other

## 2018-09-27 ENCOUNTER — Other Ambulatory Visit: Payer: Self-pay

## 2018-09-27 ENCOUNTER — Ambulatory Visit: Payer: Medicare Other | Attending: Physician Assistant

## 2018-09-27 ENCOUNTER — Encounter

## 2018-09-27 ENCOUNTER — Encounter: Payer: Self-pay | Admitting: Physical Therapy

## 2018-09-27 DIAGNOSIS — M2560 Stiffness of unspecified joint, not elsewhere classified: Secondary | ICD-10-CM | POA: Diagnosis not present

## 2018-09-27 DIAGNOSIS — M79604 Pain in right leg: Secondary | ICD-10-CM

## 2018-09-27 DIAGNOSIS — R208 Other disturbances of skin sensation: Secondary | ICD-10-CM | POA: Diagnosis not present

## 2018-09-27 DIAGNOSIS — M25562 Pain in left knee: Secondary | ICD-10-CM

## 2018-09-27 DIAGNOSIS — M6281 Muscle weakness (generalized): Secondary | ICD-10-CM

## 2018-09-27 DIAGNOSIS — M545 Low back pain, unspecified: Secondary | ICD-10-CM

## 2018-09-27 DIAGNOSIS — M79605 Pain in left leg: Secondary | ICD-10-CM

## 2018-09-27 DIAGNOSIS — M5386 Other specified dorsopathies, lumbar region: Secondary | ICD-10-CM | POA: Diagnosis not present

## 2018-09-27 DIAGNOSIS — R262 Difficulty in walking, not elsewhere classified: Secondary | ICD-10-CM

## 2018-09-27 DIAGNOSIS — R2681 Unsteadiness on feet: Secondary | ICD-10-CM

## 2018-09-27 DIAGNOSIS — M25561 Pain in right knee: Secondary | ICD-10-CM | POA: Diagnosis not present

## 2018-09-27 NOTE — Therapy (Signed)
[facility] [address], [place], [address] Phone: [phone]   Fax:  [phone]  Physical Therapy Treatment  Patient Details  Name: [name] MRN: [id number] Date of Birth: [birth year] Referring Provider (PT): Maryclare Labrador [place], [place]   Encounter Date: 09/27/2018  PT End of Session - 09/27/18 1622    Visit Number  16    Number of Visits  50    Date for PT Re-Evaluation  10/27/18    Authorization Type  5    Authorization Time Period  of 10 progress report    PT Start Time  1548   pt late   PT Stop Time  1616    PT Time Calculation (min)  28 min    Activity Tolerance  Patient tolerated treatment well    Behavior During Therapy  [facility] for tasks assessed/performed       Past Medical History:  Diagnosis Date  . Arthritis   . Depression     Past Surgical History:  Procedure Laterality Date  . ABDOMINAL HYSTERECTOMY  1995  . back sugery    . BUNIONECTOMY  2013   rt foot  . COLONOSCOPY    . MUSCLE BIOPSY Left 10/03/2012   Procedure: LEFT QUADRICEP MUSCLE BIOPSY;  Surgeon: Odis Hollingshead, MD;  Location: [facility];  Service: General;  Laterality: Left;  . NECK SURGERY  2010   cerv disc fused     There were no vitals filed for this visit.  Subjective Assessment - 09/27/18 1603    Subjective  Patient reported that she does her exercises twice a week, but does not do as many reps. Stated she could do better about being consistent.    Pertinent History  LE weakness.  Symptoms occured suddenly, unknown method of injury prior to her first neck fusion surgery on January 2010. Had lower back surgery fusion in 2011.  The neck and back surgeries did not help. Pt states having increased urinary urgency and takes medication for for it. MD aware.  Denies saddle anesthesia.  Pt states that her doctor told her that PT is the only thing that is going to help her keep moving so she continues to  participate in PT.  Last round of PT was last year which helped.  Currently has difficulty walking, performing chores (wash dishes, laundry), cooking. Better able to do her tasks a little bit when she does therapy but gets harder when she stops.  Feels burning and stinging in both her knees, and bilateral anterior and lateral legs.  Pt states not having back or neck pain. Just a stiff neck.  Pt states she usually walks with a cane on her R side. No falls within the last 6 months.      Patient Stated Goals  Be better able to walk, and get around better without the stinging and burning in her knees and legs.     Currently in Pain?  No/denies        OBJECTIVE:   TREATMENT:   Imaging reports mention decreased bone mineralization in the spine.     Therapeutic exercise: to centralize symptoms and improve ROM, strength, muscular endurance, and activity tolerance required for successful completion of functional activities.   - BAPS board level III, FW/BW, IV/EV 2x20 each then circles CW and CCW x 20 each with manual assistance to keep knee still and verbal feedback about touching sides to gain complete ROM.  -  Supine R ankle DF AAROM to end range 2x10   - Supine manually resisted R ankle EV 20x - Supine R SKTC AAROM as needed with end range OP to manually resisted leg press with clinician assist. 2x10. Attempted to transition to resisted hip flexion but unable.   - Hooklying bridge to improve hip extension, hamstring, and quad strength. Required cuing for maximal ROM. 2x10 improved ROM with each rep  - Supine R hip flexion SLR with clinician guarding and providing target x15 (improving range each rep)  - Hooklying manually resisted clamshell isometrics, hips less than 90 degrees flexion 10x5 seconds for 2 sets  - supine heel slides x 10 with clinician assistance for first 5 reps of 10 before she could complete on her own. Noted for strong tibialis anterior dominant dorsiflexion and inversion when  attempting to pull heel closer to buttocks.   - Education on diagnosis, prognosis, POC, anatomy and physiology of current condition.   - Education on HEP including handout    Improved exercise technique, movement at target joints, use of target muscles after multimodal verbal, visual, tactile cues.    HOME EXERCISE PROGRAM Access Code: [email or web address]  URL: [email or web address]  Date: 09/14/2018  Prepared by: Rosita Kea   Exercises   Supine Straight Leg Raises - 10 reps - 3 sets - 1x daily - 7x weekly   Supine Heel Slide - 3 sets - 10 reps - 1x daily - 7x weekly  Supine Bridge - 3 sets - 10 reps - 1x daily - 7x weekly       PT Education - 09/27/18 1604    Education provided  Yes    Education Details  exercises form/techniquw    Person(s) Educated  Patient    Methods  Explanation;Demonstration;Verbal cues    Comprehension  Verbalized understanding;Returned demonstration;Verbal cues required       PT Short Term Goals - 05/18/18 1247      PT SHORT TERM GOAL #1   Title  Patient will be independent with her HEP to improve strength and function.     Time  3    Period  Weeks    Status  On-going    Target Date  04/14/18        PT Long Term Goals - 08/31/18 1513      PT LONG TERM GOAL #1   Title  Patient will improve B LE strength by at least 1/2 MMT grade to help decrease knee pain, improve ability to ambulate, perform standing tasks.     Time  8    Period  Weeks    Status  On-going    Target Date  10/27/18      PT LONG TERM GOAL #2   Title  Patient will improve her LEFS score by at least 10 points as a demonstration of improved function.     Baseline  27/80 (03/21/2018); 22/80 (08/31/2018)    Time  8    Period  Weeks    Status  On-going    Target Date  10/27/18      PT LONG TERM GOAL #3   Title  Patient will improve her 10 MWT speed with her SPC to at least 0.5 m/s to promote better community ambulation.     Baseline  0.39 m/s with her SPC  (03/21/2018); 0.78 m/s (05/18/2018); 0.60 m/sec average with SPC (08/31/2018)    Time  8    Period  Weeks    Status  On-going  Target Date  10/27/18      PT LONG TERM GOAL #4   Title  Patient will have a decrease B knee pain to 4/10 or less at worst to promote ability to ambulate, perform standing tasks.     Baseline  9/10 B knee pain at most (03/21/2018); 5/10 B knee pain at worst for the past 7 days (05/18/2018); 8/10 B knee pain and burning at worst for the past month (08/31/2018)    Time  8    Period  Weeks    Status  On-going    Target Date  10/27/18            Plan - 09/27/18 1624    Clinical Impression Statement  Patient session limited due to arriving late to appointment, but remained motivated to participate with therapy. Improved muscle activation noted with repetitions of exercises though at least tactile cues necessary throughout session to maintain proper exercise technique and form. Attempted to progress ankle exercises with baps to level 3, decreased control noted. Overall the patient continued to demonstrate progression and would benefit from further skilled PT intervention.    Personal Factors and Comorbidities  Age;Comorbidity 3+;Time since onset of injury/illness/exacerbation;Past/Current Experience    Comorbidities  Depression, arthritis, neck surgery, abdominal hysterectomy, back surgery    Examination-Activity Limitations  Carry;Locomotion Level;Squat;Stairs    Rehab Potential  Fair    Clinical Impairments Affecting Rehab Potential  Chronicity of condition, weakness, varying levels of motivation    PT Frequency  2x / week    PT Duration  8 weeks    PT Treatment/Interventions  Electrical Stimulation;Aquatic Therapy;Ultrasound;Gait training;Functional mobility training;Therapeutic activities;Therapeutic exercise;Balance training;Neuromuscular re-education;Patient/family education;Manual techniques;Dry needling;Iontophoresis 4mg /ml Dexamethasone    PT Next Visit Plan   aquatic therapy (when able), PNF and funcitonal and LE strengthening, focus on R    [place] Access Code: [email or web address]    Consulted and Agree with Plan of Care  Patient       Patient will benefit from skilled therapeutic intervention in order to improve the following deficits and impairments:  Pain, Abnormal gait, Decreased balance, Decreased range of motion, Decreased strength, Difficulty walking  Visit Diagnosis: 1. Pain in right leg   2. Right knee pain, unspecified chronicity   3. Difficulty in walking, not elsewhere classified   4. Bilateral low back pain without sciatica, unspecified chronicity   5. Pain in left leg   6. Decreased ROM of lumbar spine   7. Muscle weakness (generalized)   8. Joint stiffness of spine   9. Other disturbances of skin sensation   10. Left knee pain, unspecified chronicity   11. Unsteadiness on feet        Problem List Patient Active Problem List   Diagnosis Date Noted  . Weakness 11/03/2012    Lieutenant Diego PT, DPT 4:26 PM,09/27/18 [phone]  [facility] [address], [place], [address] Phone: [phone]   Fax:  [phone]  Name: [name] MRN: [id number] Date of Birth: 06-Aug-1945

## 2018-09-29 ENCOUNTER — Encounter

## 2018-09-29 DIAGNOSIS — M170 Bilateral primary osteoarthritis of knee: Secondary | ICD-10-CM | POA: Diagnosis not present

## 2018-09-29 DIAGNOSIS — M25561 Pain in right knee: Secondary | ICD-10-CM | POA: Diagnosis not present

## 2018-09-29 DIAGNOSIS — M25562 Pain in left knee: Secondary | ICD-10-CM | POA: Diagnosis not present

## 2018-09-29 DIAGNOSIS — M1180 Other specified crystal arthropathies, unspecified site: Secondary | ICD-10-CM | POA: Diagnosis not present

## 2018-09-29 DIAGNOSIS — M25461 Effusion, right knee: Secondary | ICD-10-CM | POA: Diagnosis not present

## 2018-09-29 DIAGNOSIS — R768 Other specified abnormal immunological findings in serum: Secondary | ICD-10-CM | POA: Diagnosis not present

## 2018-10-03 ENCOUNTER — Ambulatory Visit: Payer: Medicare Other | Admitting: Physical Therapy

## 2018-10-03 ENCOUNTER — Other Ambulatory Visit: Payer: Self-pay

## 2018-10-03 ENCOUNTER — Encounter: Payer: Self-pay | Admitting: Physical Therapy

## 2018-10-03 DIAGNOSIS — R262 Difficulty in walking, not elsewhere classified: Secondary | ICD-10-CM | POA: Diagnosis not present

## 2018-10-03 DIAGNOSIS — M25562 Pain in left knee: Secondary | ICD-10-CM

## 2018-10-03 DIAGNOSIS — M79605 Pain in left leg: Secondary | ICD-10-CM

## 2018-10-03 DIAGNOSIS — R2681 Unsteadiness on feet: Secondary | ICD-10-CM

## 2018-10-03 DIAGNOSIS — M79604 Pain in right leg: Secondary | ICD-10-CM

## 2018-10-03 DIAGNOSIS — M25561 Pain in right knee: Secondary | ICD-10-CM | POA: Diagnosis not present

## 2018-10-03 DIAGNOSIS — M5386 Other specified dorsopathies, lumbar region: Secondary | ICD-10-CM | POA: Diagnosis not present

## 2018-10-03 DIAGNOSIS — M6281 Muscle weakness (generalized): Secondary | ICD-10-CM

## 2018-10-03 DIAGNOSIS — M545 Low back pain: Secondary | ICD-10-CM | POA: Diagnosis not present

## 2018-10-03 NOTE — Therapy (Signed)
[facility] [address] [place], [address] Phone: [phone]   Fax:  [phone]  Physical Therapy Treatment  Patient Details  Name: [name] MRN: [id number] Date of Birth: [birth year] Referring Provider (PT): Maryclare Labrador [place], [place]   Encounter Date: 10/03/2018  PT End of Session - 10/03/18 1408    Visit Number  17    Number of Visits  50    Date for PT Re-Evaluation  10/27/18    Authorization Type  6    Authorization Time Period  of 10 progress report    PT Start Time  1306    PT Stop Time  1350    PT Time Calculation (min)  44 min    Activity Tolerance  Patient tolerated treatment well    Behavior During Therapy  [name] for tasks assessed/performed       Past Medical History:  Diagnosis Date  . Arthritis   . Depression     Past Surgical History:  Procedure Laterality Date  . ABDOMINAL HYSTERECTOMY  1995  . back sugery    . BUNIONECTOMY  2013   rt foot  . COLONOSCOPY    . MUSCLE BIOPSY Left 10/03/2012   Procedure: LEFT QUADRICEP MUSCLE BIOPSY;  Surgeon: Odis Hollingshead, MD;  Location: [place];  Service: General;  Laterality: Left;  . NECK SURGERY  2010   cerv disc fused     There were no vitals filed for this visit.  Subjective Assessment - 10/03/18 1310    Subjective  pt is 5 min late. Patient reports she is feeling okay today but feels the heat is slowing her down. States she has no pain and has not had any falls since last session. She is doing her HEP 1x a day now and is not having any issues with them. Did not bring her AFO today due to the heat.    Pertinent History  LE weakness.  Symptoms occured suddenly, unknown method of injury prior to her first neck fusion surgery on January 2010. Had lower back surgery fusion in 2011.  The neck and back surgeries did not help. Pt states having increased urinary urgency and takes medication for for it. MD aware.  Denies saddle  anesthesia.  Pt states that her doctor told her that PT is the only thing that is going to help her keep moving so she continues to participate in PT.  Last round of PT was last year which helped.  Currently has difficulty walking, performing chores (wash dishes, laundry), cooking. Better able to do her tasks a little bit when she does therapy but gets harder when she stops.  Feels burning and stinging in both her knees, and bilateral anterior and lateral legs.  Pt states not having back or neck pain. Just a stiff neck.  Pt states she usually walks with a cane on her R side. No falls within the last 6 months.      Patient Stated Goals  Be better able to walk, and get around better without the stinging and burning in her knees and legs.     Currently in Pain?  No/denies        OBJECTIVE:  TREATMENT: Imaging reports mention decreased bone mineralization in the spine. 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  Therapeutic exercise:to centralize symptoms and improve ROM, strength, muscular endurance, and activity tolerance required for successful completion of functional  activities. -Supine R ankle DF AAROM to end range x20  -Supine manually resisted R ankle EV 20x -Supine R SKTCAAROM as needed with end range OP to manually resisted leg press with clinician assist. 2x10. Attempted to transition to resisted hip flexion but unable. -BAPS board level III, FW/BW, IV/EV 2x20each then circles CW and CCW x 20 eachwith manual assistance to keep knee still and verbal feedback about touching sides to gain complete ROM.  Therapeutic activities: for functional strengthening and improved functional activity tolerance. - attempted standing R hip flexion at hip machine starting with pressure in extended position. Patient had difficulty getting knee to 20 degrees flexion with clinician assist to clear R foot and facilitate movement. 45#. X 10.   - standing with L UE supported on TM bars. R step up with red theraband around distal anterior thigh to facilitate flexion response. First 5 reps just placing R foot on 6 inch step, then x 10 with step up. Cuing to extend hip and clear foot. Improved with repetition. Some circumduction noted to compensate for difficulty with hip/knee flexion and dorsiflexion. - Facing TM bars with BUE support there. Back lunge with back foot on furniture slider, to improve dissociation of LEs as well as reinforce moving in and out of flexion during functional activity. X 10 each side. Required manual support/control for R foot back to prevent excessive knee valgus, ankle inversion, and guarding for R knee when L going back. Patient struggled with coordination of movement and control of R leg when it was going back. Noted for lack of hip extension in standing improved with cuing.   Improved exercise technique, movement at target joints, use of target muscles aftermultimodalverbal, visual, tactile cues.  Patient response to treatment:  Pt tolerated treatment well without increase in pain. Overall is making appropriate progress towards goals given her condition. Expressed appreciation for challenging exercises and broke a sweat with lunges. Noted for poor hip control R>L, and extension biased spasticity in R leg that pt has difficulty overcoming but improves with reps. Patient had some crepitus in bilateral knees and demo bilateral knee valgus. She was educated about potential DOMS and possible joint soreness and instructed in appropriate response. She was amenable to this. Discussed being on time for full appointment each time. Today was able to be extended due to no patient scheduled directly after. patient agreed. Pt required multimodal cuing for proper technique and to facilitate improved neuromuscular control, strength, range of motion, and functional ability resulting in improved performance and form. She required  close guarding for all standing exercises to prevent fall. Patient would benefit from continued physical therapy to address remaining impairments and functional limitations to work towards stated goals and return to PLOF or maximal functional independence.     HOME EXERCISE PROGRAM Access Code: [email or web address]  URL: [email or web address]  Date: 09/14/2018  Prepared by: Rosita Kea   Exercises   Supine Straight Leg Raises - 10 reps - 3 sets - 1x daily - 7x weekly   Supine Heel Slide - 3 sets - 10 reps - 1x daily - 7x weekly   Supine Bridge - 3 sets - 10 reps - 1x daily - 7x weekly   PT Education - 10/03/18 1408    Education Details  Exercise purpose/form. Self management techniques. Education on diagnosis, anatomy and physiology of current condition    Person(s) Educated  Patient    Methods  Explanation;Demonstration;Tactile cues;Verbal cues    Comprehension  Verbalized understanding;Returned demonstration;Verbal cues required;Tactile cues required  PT Short Term Goals - 05/18/18 1247      PT SHORT TERM GOAL #1   Title  Patient will be independent with her HEP to improve strength and function.     Time  3    Period  Weeks    Status  On-going    Target Date  04/14/18        PT Long Term Goals - 08/31/18 1513      PT LONG TERM GOAL #1   Title  Patient will improve B LE strength by at least 1/2 MMT grade to help decrease knee pain, improve ability to ambulate, perform standing tasks.     Time  8    Period  Weeks    Status  On-going    Target Date  10/27/18      PT LONG TERM GOAL #2   Title  Patient will improve her LEFS score by at least 10 points as a demonstration of improved function.     Baseline  27/80 (03/21/2018); 22/80 (08/31/2018)    Time  8    Period  Weeks    Status  On-going    Target Date  10/27/18      PT LONG TERM GOAL #3   Title  Patient will improve her 10 MWT speed with her SPC to at least 0.5 m/s to promote better community  ambulation.     Baseline  0.39 m/s with her SPC (03/21/2018); 0.78 m/s (05/18/2018); 0.60 m/sec average with SPC (08/31/2018)    Time  8    Period  Weeks    Status  On-going    Target Date  10/27/18      PT LONG TERM GOAL #4   Title  Patient will have a decrease B knee pain to 4/10 or less at worst to promote ability to ambulate, perform standing tasks.     Baseline  9/10 B knee pain at most (03/21/2018); 5/10 B knee pain at worst for the past 7 days (05/18/2018); 8/10 B knee pain and burning at worst for the past month (08/31/2018)    Time  8    Period  Weeks    Status  On-going    Target Date  10/27/18            Plan - 10/03/18 1417    Clinical Impression Statement  Pt tolerated treatment well without increase in pain. Overall is making appropriate progress towards goals given her condition. Expressed appreciation for challenging exercises and broke a sweat with lunges. Noted for poor hip control R>L, and extension biased spasticity in R leg that pt has difficulty overcoming but improves with reps. Patient had some crepitus in bilateral knees and demo bilateral knee valgus. She was educated about potential DOMS and possible joint soreness and instructed in appropriate response. She was amenable to this. Discussed being on time for full appointment each time. Today was able to be extended due to no patient scheduled directly after. patient agreed. Pt required multimodal cuing for proper technique and to facilitate improved neuromuscular control, strength, range of motion, and functional ability resulting in improved performance and form. She required close guarding for all standing exercises to prevent fall. Patient would benefit from continued physical therapy to address remaining impairments and functional limitations to work towards stated goals and return to PLOF or maximal functional independence.    Personal Factors and Comorbidities  Age;Comorbidity 3+;Time since onset of  injury/illness/exacerbation;Past/Current Experience    Comorbidities  Depression, arthritis, neck  surgery, abdominal hysterectomy, back surgery    Examination-Activity Limitations  Carry;Locomotion Level;Squat;Stairs    Rehab Potential  Fair    Clinical Impairments Affecting Rehab Potential  Chronicity of condition, weakness, varying levels of motivation    PT Frequency  2x / week    PT Duration  8 weeks    PT Treatment/Interventions  Electrical Stimulation;Aquatic Therapy;Ultrasound;Gait training;Functional mobility training;Therapeutic activities;Therapeutic exercise;Balance training;Neuromuscular re-education;Patient/family education;Manual techniques;Dry needling;Iontophoresis 4mg /ml Dexamethasone    PT Next Visit Plan  aquatic therapy (when able), PNF and funcitonal and LE strengthening, focus on R    [place] Access Code: [email or web address]    Consulted and Agree with Plan of Care  Patient       Patient will benefit from skilled therapeutic intervention in order to improve the following deficits and impairments:  Pain, Abnormal gait, Decreased balance, Decreased range of motion, Decreased strength, Difficulty walking  Visit Diagnosis: 1. Difficulty in walking, not elsewhere classified   2. Pain in right leg   3. Pain in left leg   4. Muscle weakness (generalized)   5. Left knee pain, unspecified chronicity   6. Unsteadiness on feet   7. Right knee pain, unspecified chronicity        Problem List Patient Active Problem List   Diagnosis Date Noted  . Weakness 11/03/2012    Everlean Alstrom. Graylon Good, PT, DPT 10/03/18, 2:22 PM  [facility] [address], [place], [address] Phone: [phone]   Fax:  [phone]  Name: [name] MRN: [id number] Date of Birth: [birth year]

## 2018-10-04 ENCOUNTER — Encounter

## 2018-10-05 ENCOUNTER — Ambulatory Visit: Payer: Medicare Other | Admitting: Physical Therapy

## 2018-10-05 ENCOUNTER — Encounter: Payer: Self-pay | Admitting: Physical Therapy

## 2018-10-05 ENCOUNTER — Other Ambulatory Visit: Payer: Self-pay

## 2018-10-05 DIAGNOSIS — M5386 Other specified dorsopathies, lumbar region: Secondary | ICD-10-CM | POA: Diagnosis not present

## 2018-10-05 DIAGNOSIS — M545 Low back pain: Secondary | ICD-10-CM | POA: Diagnosis not present

## 2018-10-05 DIAGNOSIS — M25561 Pain in right knee: Secondary | ICD-10-CM

## 2018-10-05 DIAGNOSIS — M79605 Pain in left leg: Secondary | ICD-10-CM

## 2018-10-05 DIAGNOSIS — M79604 Pain in right leg: Secondary | ICD-10-CM | POA: Diagnosis not present

## 2018-10-05 DIAGNOSIS — M25562 Pain in left knee: Secondary | ICD-10-CM

## 2018-10-05 DIAGNOSIS — R262 Difficulty in walking, not elsewhere classified: Secondary | ICD-10-CM

## 2018-10-05 DIAGNOSIS — R2681 Unsteadiness on feet: Secondary | ICD-10-CM

## 2018-10-05 DIAGNOSIS — M6281 Muscle weakness (generalized): Secondary | ICD-10-CM

## 2018-10-05 NOTE — Therapy (Signed)
[facility] [address] [place], [address] Phone: [phone]   Fax:  [phone]  Physical Therapy Treatment  Patient Details  Name: [name] MRN: [id number] Date of Birth: 07-Feb-1946 Referring Provider (PT): Maryclare Labrador [place], [place]   Encounter Date: 10/05/2018  PT End of Session - 10/05/18 1153    Visit Number  18    Number of Visits  50    Date for PT Re-Evaluation  10/27/18    Authorization Type  7    Authorization Time Period  of 10 progress report    PT Start Time  0950    PT Stop Time  1030    PT Time Calculation (min)  40 min    Equipment Utilized During Treatment  Gait belt    Activity Tolerance  Patient tolerated treatment well;Patient limited by fatigue    Behavior During Therapy  [facility] for tasks assessed/performed       Past Medical History:  Diagnosis Date  . Arthritis   . Depression     Past Surgical History:  Procedure Laterality Date  . ABDOMINAL HYSTERECTOMY  1995  . back sugery    . BUNIONECTOMY  2013   rt foot  . COLONOSCOPY    . MUSCLE BIOPSY Left 10/03/2012   Procedure: LEFT QUADRICEP MUSCLE BIOPSY;  Surgeon: Odis Hollingshead, MD;  Location: [place] [facility];  Service: General;  Laterality: Left;  . NECK SURGERY  2010   cerv disc fused     There were no vitals filed for this visit.  Subjective Assessment - 10/05/18 0953    Subjective  patient is 5 min late. States she is feeling hot today but has no pain and no falls since last session. Was a bit sore but feels it was acceptable to her. States she feels she is moving a little better - not as stiff.    Pertinent History  LE weakness.  Symptoms occured suddenly, unknown method of injury prior to her first neck fusion surgery on January 2010. Had lower back surgery fusion in 2011.  The neck and back surgeries did not help. Pt states having increased urinary urgency and takes medication for for it. MD aware.  Denies  saddle anesthesia.  Pt states that her doctor told her that PT is the only thing that is going to help her keep moving so she continues to participate in PT.  Last round of PT was last year which helped.  Currently has difficulty walking, performing chores (wash dishes, laundry), cooking. Better able to do her tasks a little bit when she does therapy but gets harder when she stops.  Feels burning and stinging in both her knees, and bilateral anterior and lateral legs.  Pt states not having back or neck pain. Just a stiff neck.  Pt states she usually walks with a cane on her R side. No falls within the last 6 months.      Patient Stated Goals  Be better able to walk, and get around better without the stinging and burning in her knees and legs.     Currently in Pain?  No/denies         OBJECTIVE:  TREATMENT: Imaging reports mention decreased bone mineralization in the spine. 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  Therapeutic exercise:to centralize symptoms and improve ROM, strength, muscular endurance, and activity tolerance required for successful completion of functional activities. -Supine  R SKTC with dorsiflexion iniiating AAROM as needed with end range OP stretch x 5 seconds to manually resisted leg press with clinician assist. X 20  Therapeutic activities: for functional strengthening and improved functional activity tolerance. - hopping on total gym at level 14, 3x 10. Required assistance to guide feet to land back on platform. Red theraband around knees with cuing for R hip abduction to prevent knee valgus. To improve LE strength and power and induce neuromodulation for improved stepping.  - ambulation 2x50 feet around clinic with SPC to assess changes in gait following interventions. Improved ability to lift and clear R foot during swing phase following SKTC exercise and hops on total gym.  - standing with L UE supported on  TM bars. R step up 2x 10 to 6 inch step. Cuing to extend hip and clear foot. Improved with repetition. Reduced this visit to compensate for difficulty with hip/knee flexion and dorsiflexion. Occasionally used R hand to assist leg.  - Facing TM bars with BUE support.. Back lunge with back foot on furniture slider, to improve dissociation of LEs as well as reinforce moving in and out of flexion during functional activity. X 10 R leg back only. Required manual support/control for R foot back to prevent excessive knee valgus, ankle inversion. Patient struggled with coordination of movement and control of R leg when it was going back. Noted for uneven posture throughout body, that improved with cuing.   Improved exercise technique, movement at target joints, use of target muscles aftermultimodalverbal, visual, tactile cues.     HOME EXERCISE PROGRAM Access Code: [email or web address]  URL: [email or web address]  Date: 09/14/2018  Prepared by: Rosita Kea   Exercises   Supine Straight Leg Raises - 10 reps - 3 sets - 1x daily - 7x weekly   Supine Heel Slide - 3 sets - 10 reps - 1x daily - 7x weekly   Supine Bridge - 3 sets - 10 reps - 1x daily - 7x weekly   PT Short Term Goals - 05/18/18 1247      PT SHORT TERM GOAL #1   Title  Patient will be independent with her HEP to improve strength and function.     Time  3    Period  Weeks    Status  On-going    Target Date  04/14/18        PT Long Term Goals - 08/31/18 1513      PT LONG TERM GOAL #1   Title  Patient will improve B LE strength by at least 1/2 MMT grade to help decrease knee pain, improve ability to ambulate, perform standing tasks.     Time  8    Period  Weeks    Status  On-going    Target Date  10/27/18      PT LONG TERM GOAL #2   Title  Patient will improve her LEFS score by at least 10 points as a demonstration of improved function.     Baseline  27/80 (03/21/2018); 22/80 (08/31/2018)    Time  8    Period   Weeks    Status  On-going    Target Date  10/27/18      PT LONG TERM GOAL #3   Title  Patient will improve her 10 MWT speed with her SPC to at least 0.5 m/s to promote better community ambulation.     Baseline  0.39 m/s with her SPC (03/21/2018); 0.78 m/s (05/18/2018); 0.60 m/sec average with SPC (08/31/2018)  Time  8    Period  Weeks    Status  On-going    Target Date  10/27/18      PT LONG TERM GOAL #4   Title  Patient will have a decrease B knee pain to 4/10 or less at worst to promote ability to ambulate, perform standing tasks.     Baseline  9/10 B knee pain at most (03/21/2018); 5/10 B knee pain at worst for the past 7 days (05/18/2018); 8/10 B knee pain and burning at worst for the past month (08/31/2018)    Time  8    Period  Weeks    Status  On-going    Target Date  10/27/18            Plan - 10/05/18 1152    Clinical Impression Statement  Pt tolerated treatment well without increase in pain. Overall is making appropriate progress towards goals given her condition. Broke a sweat early on in the session and required rest brakes due to the intensity of the effort. Demo improved R foot clearance in swing phase and ability to step up to 6 inch following hopping and DKTC exercise and was able to complete more functional activities this session than last. Continues to express appreciation for challenging exercises and broke a sweat with lunges. Noted for poor hip control R>L, and extension biased spasticity in R leg that pt has difficulty overcoming but improves with reps. Patient had some crepitus in bilateral knees and demo bilateral knee valgus. Pt required multimodal cuing for proper technique and to facilitate improved neuromuscular control, strength, range of motion, and functional ability resulting in improved performance and form. She required close guarding for all standing exercises to prevent fall. Patient would benefit from continued physical therapy to address remaining  impairments and functional limitations to work towards stated goals and return to PLOF or maximal functional independence.    Personal Factors and Comorbidities  Age;Comorbidity 3+;Time since onset of injury/illness/exacerbation;Past/Current Experience    Comorbidities  Depression, arthritis, neck surgery, abdominal hysterectomy, back surgery    Examination-Activity Limitations  Carry;Locomotion Level;Squat;Stairs    Rehab Potential  Fair    Clinical Impairments Affecting Rehab Potential  Chronicity of condition, weakness, varying levels of motivation    PT Frequency  2x / week    PT Duration  8 weeks    PT Treatment/Interventions  Electrical Stimulation;Aquatic Therapy;Ultrasound;Gait training;Functional mobility training;Therapeutic activities;Therapeutic exercise;Balance training;Neuromuscular re-education;Patient/family education;Manual techniques;Dry needling;Iontophoresis 4mg /ml Dexamethasone    PT Next Visit Plan  aquatic therapy (when able), PNF and funcitonal and LE strengthening, focus on R    [place] Access Code: [email or web address]    Consulted and Agree with Plan of Care  Patient       Patient will benefit from skilled therapeutic intervention in order to improve the following deficits and impairments:  Pain, Abnormal gait, Decreased balance, Decreased range of motion, Decreased strength, Difficulty walking  Visit Diagnosis: 1. Difficulty in walking, not elsewhere classified   2. Pain in right leg   3. Pain in left leg   4. Muscle weakness (generalized)   5. Left knee pain, unspecified chronicity   6. Unsteadiness on feet   7. Right knee pain, unspecified chronicity        Problem List Patient Active Problem List   Diagnosis Date Noted  . Weakness 11/03/2012    Everlean Alstrom. Graylon Good, PT, DPT 10/05/18, 11:54 AM  [facility]  [address] [place], [address] Phone: [phone]   Fax:   [phone]  Name: [name] MRN: [id number] Date of Birth: Oct 09, 1945

## 2018-10-06 ENCOUNTER — Encounter

## 2018-10-10 ENCOUNTER — Encounter

## 2018-10-11 ENCOUNTER — Other Ambulatory Visit: Payer: Self-pay

## 2018-10-11 ENCOUNTER — Ambulatory Visit: Payer: Medicare Other | Admitting: Physical Therapy

## 2018-10-11 ENCOUNTER — Encounter: Payer: Self-pay | Admitting: Physical Therapy

## 2018-10-11 DIAGNOSIS — M6281 Muscle weakness (generalized): Secondary | ICD-10-CM

## 2018-10-11 DIAGNOSIS — M79605 Pain in left leg: Secondary | ICD-10-CM | POA: Diagnosis not present

## 2018-10-11 DIAGNOSIS — M79604 Pain in right leg: Secondary | ICD-10-CM

## 2018-10-11 DIAGNOSIS — M545 Low back pain: Secondary | ICD-10-CM | POA: Diagnosis not present

## 2018-10-11 DIAGNOSIS — R262 Difficulty in walking, not elsewhere classified: Secondary | ICD-10-CM | POA: Diagnosis not present

## 2018-10-11 DIAGNOSIS — M25561 Pain in right knee: Secondary | ICD-10-CM

## 2018-10-11 DIAGNOSIS — M5386 Other specified dorsopathies, lumbar region: Secondary | ICD-10-CM | POA: Diagnosis not present

## 2018-10-11 DIAGNOSIS — M25562 Pain in left knee: Secondary | ICD-10-CM

## 2018-10-11 DIAGNOSIS — R2681 Unsteadiness on feet: Secondary | ICD-10-CM

## 2018-10-11 NOTE — Therapy (Signed)
[facility] [address], [place], [address] Phone: [phone]   Fax:  [phone]  Physical Therapy Treatment  Patient Details  Name: [name] MRN: [id number] Date of Birth: November 01, 1945 Referring Provider (PT): Maryclare Labrador [place], [place]   Encounter Date: 10/11/2018  PT End of Session - 10/11/18 1307    Visit Number  19    Number of Visits  50    Date for PT Re-Evaluation  10/27/18    Authorization Type  8    Authorization Time Period  of 10 progress report    PT Start Time  1303    PT Stop Time  1343    PT Time Calculation (min)  40 min    Equipment Utilized During Treatment  Gait belt    Activity Tolerance  Patient tolerated treatment well;Patient limited by fatigue    Behavior During Therapy  [facility] for tasks assessed/performed       Past Medical History:  Diagnosis Date  . Arthritis   . Depression     Past Surgical History:  Procedure Laterality Date  . ABDOMINAL HYSTERECTOMY  1995  . back sugery    . BUNIONECTOMY  2013   rt foot  . COLONOSCOPY    . MUSCLE BIOPSY Left 10/03/2012   Procedure: LEFT QUADRICEP MUSCLE BIOPSY;  Surgeon: Odis Hollingshead, MD;  Location: [facility];  Service: General;  Laterality: Left;  . NECK SURGERY  2010   cerv disc fused     There were no vitals filed for this visit.  Subjective Assessment - 10/11/18 1306    Subjective  Patient reports she is feeling well today. She thinks her walking is getting a little easier. She was very sore in both of her quads for two days following last treatment session, but it is better now. States her HEP is going well. She completes them daily except when she was so sore.    Pertinent History  LE weakness.  Symptoms occured suddenly, unknown method of injury prior to her first neck fusion surgery on January 2010. Had lower back surgery fusion in 2011.  The neck and back surgeries did not help. Pt states having increased  urinary urgency and takes medication for for it. MD aware.  Denies saddle anesthesia.  Pt states that her doctor told her that PT is the only thing that is going to help her keep moving so she continues to participate in PT.  Last round of PT was last year which helped.  Currently has difficulty walking, performing chores (wash dishes, laundry), cooking. Better able to do her tasks a little bit when she does therapy but gets harder when she stops.  Feels burning and stinging in both her knees, and bilateral anterior and lateral legs.  Pt states not having back or neck pain. Just a stiff neck.  Pt states she usually walks with a cane on her R side. No falls within the last 6 months.      Patient Stated Goals  Be better able to walk, and get around better without the stinging and burning in her knees and legs.     Currently in Pain?  No/denies        OBJECTIVE:  TREATMENT: Imaging reports mention decreased bone mineralization in the spine. 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  Therapeutic exercise:to centralize symptoms and improve ROM, strength, muscular endurance, and activity tolerance required  for successful completion of functional activities. -Supine R SKTC with dorsiflexion iniating AAROM as needed with end range OP stretch x 5 seconds to manually resisted leg press with clinician assist. X 20, x 10 alternating quickly with AAROM to slight resistance, clinician extending toes to help break extension tone.  - seated on rolling stool scooting to engage hip and knee flexion, alternating legs x 20 feet, R leg only with clinician assistance to hold up left foot. X 20 feet.  - Seated R leg extension/flexion with foot on soccer ball x 10 with assistance from physical therapist to keep foot on ball.   Therapeutic activities: for functional strengthening and improved functional activity tolerance. - hopping on total gym at level 14,  3x 10. Required assistance to guide feet to land back on platform. Red theraband around knees with cuing for R hip abduction to prevent knee valgus. To improve LE strength and power and induce neuromodulation for improved stepping.  - standing with L UE supported on TM bars. R step up 2x 10 to 6 inch step. Cuing to extend hip and clear foot. Improved with repetition. Reduced this visit to compensate for difficulty with hip/knee flexion and dorsiflexion. Occasionally used R hand to assist leg. Attempted to provide outside support with blue theraband to aid dorsiflexion second set without improvement.  - ambulation x50 and x100 feet around clinic with CGA and without AD to practice improved gait pattern. Patient with slight circumduction gait and kicking out each step, but able to swing through with foot drop present. Quick fatigue.  Improved exercise technique, movement at target joints, use of target muscles aftermultimodalverbal, visual, tactile cues.  HOME EXERCISE PROGRAM Access Code: [email or web address]  URL: [email or web address]  Date: 09/14/2018  Prepared by: Rosita Kea   Exercises   Supine Straight Leg Raises - 10 reps - 3 sets - 1x daily - 7x weekly   Supine Heel Slide - 3 sets - 10 reps - 1x daily - 7x weekly   Supine Bridge - 3 sets - 10 reps - 1x daily - 7x weekly    PT Education - 10/11/18 1455    Education provided  Yes    Education Details  Exercise purpose/form. Self management techniques. Education on diagnosis, anatomy and physiology of current condition    Person(s) Educated  Patient    Methods  Explanation;Demonstration;Tactile cues;Verbal cues    Comprehension  Verbalized understanding;Returned demonstration;Verbal cues required;Tactile cues required       PT Short Term Goals - 05/18/18 1247      PT SHORT TERM GOAL #1   Title  Patient will be independent with her HEP to improve strength and function.     Time  3    Period  Weeks    Status  On-going     Target Date  04/14/18        PT Long Term Goals - 08/31/18 1513      PT LONG TERM GOAL #1   Title  Patient will improve B LE strength by at least 1/2 MMT grade to help decrease knee pain, improve ability to ambulate, perform standing tasks.     Time  8    Period  Weeks    Status  On-going    Target Date  10/27/18      PT LONG TERM GOAL #2   Title  Patient will improve her LEFS score by at least 10 points as a demonstration of improved function.     Baseline  27/80 (03/21/2018);  22/80 (08/31/2018)    Time  8    Period  Weeks    Status  On-going    Target Date  10/27/18      PT LONG TERM GOAL #3   Title  Patient will improve her 10 MWT speed with her SPC to at least 0.5 m/s to promote better community ambulation.     Baseline  0.39 m/s with her SPC (03/21/2018); 0.78 m/s (05/18/2018); 0.60 m/sec average with SPC (08/31/2018)    Time  8    Period  Weeks    Status  On-going    Target Date  10/27/18      PT LONG TERM GOAL #4   Title  Patient will have a decrease B knee pain to 4/10 or less at worst to promote ability to ambulate, perform standing tasks.     Baseline  9/10 B knee pain at most (03/21/2018); 5/10 B knee pain at worst for the past 7 days (05/18/2018); 8/10 B knee pain and burning at worst for the past month (08/31/2018)    Time  8    Period  Weeks    Status  On-going    Target Date  10/27/18            Plan - 10/11/18 1513    Clinical Impression Statement  Patient tolerated treatment well and continues to make appropriate progress towards goals, given her condition. Patient continues to have difficulty with R hip flexion, knee flexion and ankle dorsiflexion. She appears to have some extensor tone in this extremity that exacerbates weakness but improves with physical therapy intervention. Patient reported no increase in pain and was able to walk with improved gait pattern by end of session. Patient asked to bring AFO to next visit for assessment of gait and function  with AFO. Patient would benefit from continued physical therapy to address remaining impairments and functional limitations to work towards stated goals and return to PLOF or maximal functional independence.    Personal Factors and Comorbidities  Age;Comorbidity 3+;Time since onset of injury/illness/exacerbation;Past/Current Experience    Comorbidities  Depression, arthritis, neck surgery, abdominal hysterectomy, back surgery    Examination-Activity Limitations  Carry;Locomotion Level;Squat;Stairs    Rehab Potential  Fair    Clinical Impairments Affecting Rehab Potential  Chronicity of condition, weakness, varying levels of motivation    PT Frequency  2x / week    PT Duration  8 weeks    PT Treatment/Interventions  Electrical Stimulation;Aquatic Therapy;Ultrasound;Gait training;Functional mobility training;Therapeutic activities;Therapeutic exercise;Balance training;Neuromuscular re-education;Patient/family education;Manual techniques;Dry needling;Iontophoresis 4mg /ml Dexamethasone    PT Next Visit Plan  aquatic therapy (when able), PNF and funcitonal and LE strengthening, focus on R    [place] Access Code: [email or web address]    Consulted and Agree with Plan of Care  Patient       Patient will benefit from skilled therapeutic intervention in order to improve the following deficits and impairments:  Pain, Abnormal gait, Decreased balance, Decreased range of motion, Decreased strength, Difficulty walking  Visit Diagnosis: 1. Difficulty in walking, not elsewhere classified   2. Pain in right leg   3. Pain in left leg   4. Muscle weakness (generalized)   5. Left knee pain, unspecified chronicity   6. Unsteadiness on feet   7. Right knee pain, unspecified chronicity        Problem List Patient Active Problem List   Diagnosis Date Noted  . Weakness 11/03/2012    Everlean Alstrom. Graylon Good, PT, DPT  10/11/18, 3:15 PM  [facility] [address] [place], [address] Phone: [phone]   Fax:  [phone]  Name: [name] MRN: [id number] Date of Birth: 1945/04/01

## 2018-10-12 ENCOUNTER — Encounter

## 2018-10-13 ENCOUNTER — Ambulatory Visit: Payer: Medicare Other | Admitting: Physical Therapy

## 2018-10-13 ENCOUNTER — Other Ambulatory Visit: Payer: Self-pay

## 2018-10-13 ENCOUNTER — Encounter: Payer: Self-pay | Admitting: Physical Therapy

## 2018-10-13 DIAGNOSIS — M79605 Pain in left leg: Secondary | ICD-10-CM | POA: Diagnosis not present

## 2018-10-13 DIAGNOSIS — M25561 Pain in right knee: Secondary | ICD-10-CM | POA: Diagnosis not present

## 2018-10-13 DIAGNOSIS — R262 Difficulty in walking, not elsewhere classified: Secondary | ICD-10-CM | POA: Diagnosis not present

## 2018-10-13 DIAGNOSIS — M79604 Pain in right leg: Secondary | ICD-10-CM

## 2018-10-13 DIAGNOSIS — M5386 Other specified dorsopathies, lumbar region: Secondary | ICD-10-CM | POA: Diagnosis not present

## 2018-10-13 DIAGNOSIS — M6281 Muscle weakness (generalized): Secondary | ICD-10-CM

## 2018-10-13 DIAGNOSIS — R2681 Unsteadiness on feet: Secondary | ICD-10-CM

## 2018-10-13 DIAGNOSIS — M545 Low back pain: Secondary | ICD-10-CM | POA: Diagnosis not present

## 2018-10-13 DIAGNOSIS — M25562 Pain in left knee: Secondary | ICD-10-CM
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
## 2018-10-18 ENCOUNTER — Encounter: Payer: Self-pay | Admitting: Physical Therapy

## 2018-10-18 ENCOUNTER — Encounter

## 2018-10-18 ENCOUNTER — Other Ambulatory Visit: Payer: Self-pay

## 2018-10-18 ENCOUNTER — Ambulatory Visit: Payer: Medicare Other | Admitting: Physical Therapy

## 2018-10-18 DIAGNOSIS — M5386 Other specified dorsopathies, lumbar region: Secondary | ICD-10-CM | POA: Diagnosis not present

## 2018-10-18 DIAGNOSIS — M545 Low back pain: Secondary | ICD-10-CM | POA: Diagnosis not present

## 2018-10-18 DIAGNOSIS — M79604 Pain in right leg: Secondary | ICD-10-CM

## 2018-10-18 DIAGNOSIS — R262 Difficulty in walking, not elsewhere classified: Secondary | ICD-10-CM

## 2018-10-18 DIAGNOSIS — M25562 Pain in left knee: Secondary | ICD-10-CM

## 2018-10-18 DIAGNOSIS — M79605 Pain in left leg: Secondary | ICD-10-CM

## 2018-10-18 DIAGNOSIS — M25561 Pain in right knee: Secondary | ICD-10-CM | POA: Diagnosis not present

## 2018-10-18 DIAGNOSIS — R2681 Unsteadiness on feet: Secondary | ICD-10-CM

## 2018-10-18 DIAGNOSIS — M6281 Muscle weakness (generalized): Secondary | ICD-10-CM

## 2018-10-18 NOTE — Therapy (Signed)
[facility] [address], [place], [address] Phone: [phone]   Fax:  [phone]  Physical Therapy Treatment / Progress Note / Re-Certification Reporting Period: 08/31/2018 - 10/18/2018  Patient Details  Name: [name] MRN: [id number] Date of Birth: [birth year] Referring Provider (PT): Maryclare Labrador Joy, [place]   Encounter Date: 10/18/2018  PT End of Session - 10/18/18 1526    Visit Number  21    Number of Visits  50    Date for PT Re-Evaluation  01/10/19    Authorization Type  Medicare reporting period from 08/31/2018 (new reporting period from 10/18/2018);    Authorization Time Period  of 10 progress report    Authorization - Visit Number  10    Authorization - Number of Visits  10    PT Start Time  1435    PT Stop Time  1515    PT Time Calculation (min)  40 min    Equipment Utilized During Treatment  Gait belt    Activity Tolerance  Patient tolerated treatment well;Patient limited by fatigue    Behavior During Therapy  WFL for tasks assessed/performed       Past Medical History:  Diagnosis Date  . Arthritis   . Depression     Past Surgical History:  Procedure Laterality Date  . ABDOMINAL HYSTERECTOMY  1995  . back sugery    . BUNIONECTOMY  2013   rt foot  . COLONOSCOPY    . MUSCLE BIOPSY Left 10/03/2012   Procedure: LEFT QUADRICEP MUSCLE BIOPSY;  Surgeon: Odis Hollingshead, MD;  Location: [facility];  Service: General;  Laterality: Left;  . NECK SURGERY  2010   cerv disc fused     There were no vitals filed for this visit.  Subjective Assessment - 10/18/18 1442    Subjective  Patient reports her knees have a little ache in them, nothing out of ordinary for her. Felt good after last session with no excessive soreness. Reports she feels things are getting a little easier for her to do. Reports she is enjoying being challenged in the clinic and is doing things she never thought  she could do, such as the hopping on the [employer]. She feels she is getting stronger.    Pertinent History  LE weakness.  Symptoms occured suddenly, unknown method of injury prior to her first neck fusion surgery on January 2010. Had lower back surgery fusion in 2011.  The neck and back surgeries did not help. Pt states having increased urinary urgency and takes medication for for it. MD aware.  Denies saddle anesthesia.  Pt states that her doctor told her that PT is the only thing that is going to help her keep moving so she continues to participate in PT.  Last round of PT was last year which helped.  Currently has difficulty walking, performing chores (wash dishes, laundry), cooking. Better able to do her tasks a little bit when she does therapy but gets harder when she stops.  Feels burning and stinging in both her knees, and bilateral anterior and lateral legs.  Pt states not having back or neck pain. Just a stiff neck.  Pt states she usually walks with a cane on her R side. No falls within the last 6 months.      Patient Stated Goals  Be better able to walk, and get around better without the stinging and burning in her knees and legs.  Currently in Pain?  Yes    Pain Score  5     Pain Location  Knee    Pain Orientation  Right;Left    Pain Descriptors / Indicators  Aching   usual aching and soreness, bothersome but at acceptable level currently   Pain Type  Chronic pain    Pain Onset  More than a month ago    Pain Frequency  Constant    Aggravating Factors   walking, sit <> standing, getting in and out of a car, stair negotiation    Pain Relieving Factors  sitting and resting         [facility] PT Assessment - 10/18/18 0001      Assessment   Medical Diagnosis  S/P lumbar fusion,m weakness of both lower extremities    Referring Provider (PT)  Luster Landsberg, PA    Onset Date/Surgical Date  03/10/18    Hand Dominance  Right    Prior Therapy  Patient participated in prior PT for LE  strengthening with good results. Decreased strength when pt took a break from participating in PT.       Precautions   Precautions  Fall    Precaution Comments  No known precautions besides increased fall risk    Required Braces or Orthoses  Other Brace/Splint   has AFO for R foot, not required     Restrictions   Weight Bearing Restrictions  No    Other Position/Activity Restrictions  No known restrictions      Home Environment   Additional Comments  Pt lives in a 1 story home with her husband, 2 steps to enter, no rail       Prior Function   Level of Independence  Independent      Cognition   Overall Cognitive Status  Within Functional Limits for tasks assessed      Observation/Other Assessments   Observations  see note from 10/18/2018 for latest objective data    Lower Extremity Functional Scale   17/80      Strength   Right Hip Flexion  2-/5    Right Hip Extension  4+/5   standing, knee extended, manually resisted   Right Hip ABduction  4-/5   seated clamshell position, hips less than 90 degrees flexion   Left Hip Flexion  4+/5    Left Hip Extension  5/5   standing, manually resisted knee extended   Left Hip ABduction  4+/5   seated clamshell position, hips less than 90 degrees flexion   Right Knee Flexion  4-/5    Right Knee Extension  4+/5    Left Knee Flexion  5/5    Left Knee Extension  5/5    Right Ankle Dorsiflexion  4/5   at available range; stiff end feel   Left Ankle Dorsiflexion  5/5      Transfers   Five time sit to stand comments   --      Ambulation/Gait   Ambulation/Gait  Yes   R circumduction gait, min knee flexion and DF in swing phase   Ambulation/Gait Assistance  5: Supervision    Ambulation/Gait Assistance Details  for safety    Ambulation Distance (Feet)  672 Feet    Assistive device  Straight cane    Gait Pattern  Decreased hip/knee flexion - right;Decreased dorsiflexion - right;Decreased weight shift to right;Right circumduction;Right hip  hike;Right steppage;Right foot flat;Poor foot clearance - right   Patient uses AFO to help R foot clear  floor     6 minute walk test results    Aerobic Endurance Distance Walked  672   SPC, no rests, SBA for safety, one trip with R foot catching     Standardized Balance Assessment   Five times sit to stand comments   17 seconds with BUE support from low plinth    10 Meter Walk  80msec, SPC, R AFO         OBJECTIVE: See above for specific measures.   TREATMENT: Imaging reports mention decreased bone mineralization in the spine. 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  Therapeutic exercise:to centralize symptoms and improve ROM, strength, muscular endurance, and activity tolerance required for successful completion of functional activities. - completion of LEFS (unbilled) to assess progress x 5 min.  - Measurement of LE strength (MMT), walking speed (10MWT), walking endurance (6MWT), LE strength and power (5TSTS) to assess progress and functional ability (see above). Patient required several rest breaks due to quick fatigue.   HOME EXERCISE PROGRAM Access Code: [email or web address] URL: [email or web address]  Date: 09/14/2018  Prepared by: SRosita Kea  Exercises   Supine Straight Leg Raises - 10 reps - 3 sets - 1x daily - 7x weekly   Supine Heel Slide - 3 sets - 10 reps - 1x daily - 7x weekly   Supine Bridge - 3 sets - 10 reps - 1x daily - 7x weekly   PT Education - 10/18/18 1526    Education provided  Yes    Education Details  Exercise purpose/form. Self management techniques. Education on diagnosis, anatomy and physiology of current condition    Person(s) Educated  Patient    Methods  Explanation;Demonstration;Tactile cues;Verbal cues    Comprehension  Verbalized understanding;Returned demonstration;Verbal cues required       PT Short Term Goals - 10/18/18 1532      PT SHORT TERM GOAL #1   Title   Patient will be independent with her HEP to improve strength and function.     Baseline  Patient is participating in HEP at this point but has not yet acheived final long term HEP.    Time  3    Period  Weeks    Status  Partially Met    Target Date  04/14/18        PT Long Term Goals - 10/18/18 1534      PT LONG TERM GOAL #1   Title  Patient will improve B LE strength by at least 1/2 MMT grade to help decrease knee pain, improve ability to ambulate, perform standing tasks.     Time  12    Period  Weeks    Status  Partially Met    Target Date  01/10/19      PT LONG TERM GOAL #2   Title  Patient will improve her LEFS score by at least 10 points as a demonstration of improved function.     Baseline  27/80 (03/21/2018); 22/80 (08/31/2018); 17/80 (10/18/2018);    Time  12    Period  Weeks    Status  On-going    Target Date  01/10/19      PT LONG TERM GOAL #3   Title  Patient will improve her 10 MWT speed with her SPC to at least 0.5 m/s to promote better community ambulation.     Baseline  0.39 m/s with her SPC (03/21/2018); 0.78 m/s (05/18/2018); 0.60 m/sec average with SPC (08/31/2018); 1.0 m/sec  with SPC (10/18/2018);    Time  8    Period  Weeks    Status  Achieved    Target Date  10/27/18      PT LONG TERM GOAL #4   Title  Patient will have a decrease B knee pain to 4/10 or less at worst to promote ability to ambulate, perform standing tasks.     Baseline  9/10 B knee pain at most (03/21/2018); 5/10 B knee pain at worst for the past 7 days (05/18/2018); 8/10 B knee pain and burning at worst for the past month (08/31/2018); 5/10 (10/18/2018);    Time  12    Period  Weeks    Status  Partially Met    Target Date  01/10/19      PT LONG TERM GOAL #5   Title  Patient will increase walking speed to equal or greater than 1.28msec during 10MWT to demonstrate ability to cross the street safely.    Baseline  1.0 m/sec  with SPC (10/18/2018);    Time  12    Period  Weeks    Status  New     Target Date  01/10/19      PT LONG TERM GOAL #6   Title  Pateint will improve 6 Minute Walk Test distance to equal or greater than 1000 feet with LRAD to demonstrate improved activity tolerance and endurance for community mobility and participation.    Baseline  672 feet with SPC, R AFO, and CGA for safety (10/18/2018);    Time  12    Period  Weeks    Status  New    Target Date  01/10/19      PT LONG TERM GOAL #7   Title  Patient will complete 5 Times Sit to Stand test from chair height without UE support in equal or less than 14 seconds to improve B LE power and strength for transfers and improved mobility, and demonstrate decreased fall risk (threshold between 12 and 15 for increased fall risk).    Baseline  17 seconds from chair high plinth with BUE support (10/18/2018);    Time  12    Period  Weeks    Target Date  01/10/19        Plan - 10/18/18 1557    Clinical Impression Statement  Patient has attended 21 skilled physical therapy treatment sessions this episode of care and overall continues to make gradual progress towards stated goals. Patient is a 73y.o. female referred to outpatient physical therapy with a medical diagnosis of s/p lumbar fusion with B LE weakness who presents with signs and symptoms consistent with chronic B LE weakness R > L and radiculopathy in R LE causing difficulty with global LE flexion (hip, knee, and dorsiflexion) and apparent mild R LE extensor spacticity that limits patients ability to ambulate with normal gait pattern or perform weight bearing activities without great difficulty and risk of falling.  Focus of current treatment sessions has been improved LE strength, power, neuromuscular control, and balance. Patient demonstrates improvement in B knee pain, activity tolerance, strength, and understanding of self-management techniques. Patient continues to present with with significant balance, strength/power, motor control, muscular endurance, reaction time,  ROM, fall risk, and activity tolerance impairments that are limiting ability to complete ADLs, IADLs, household and community ambulation, social participation, walking group, fitness activities, running, stairs, picking things up off the floor, housework, transfers, without difficulty. Patient will benefit from continued skilled physical therapy intervention to address  current body structure impairments and activity limitations to improve function and work towards goals set in current POC in order to return to prior level of function or maximal functional improvement. Patient has previously demonstrated decline in function and independence without continued physical therapy. She requires close guarding and cuing to exercise safely and requires ongoing skilled physical therapy to prevent further loss of function and social participation ability.    Personal Factors and Comorbidities  Age;Comorbidity 3+;Time since onset of injury/illness/exacerbation;Past/Current Experience    Comorbidities  Depression, arthritis, neck surgery, abdominal hysterectomy, back surgery    Examination-Activity Limitations  Carry;Locomotion Level;Squat;Stairs    Rehab Potential  Fair    Clinical Impairments Affecting Rehab Potential  Chronicity of condition, weakness, varying levels of motivation    PT Frequency  2x / week    PT Duration  12 weeks    PT Treatment/Interventions  Electrical Stimulation;Aquatic Therapy;Ultrasound;Gait training;Functional mobility training;Therapeutic activities;Therapeutic exercise;Balance training;Neuromuscular re-education;Patient/family education;Manual techniques;Dry needling;Iontophoresis '4mg'$ /ml Dexamethasone    PT Next Visit Plan  aquatic therapy (when able), PNF and funcitonal and LE strengthening, focus on R    [place] Access Code: [email or web address]    Consulted and Agree with Plan of Care  Patient       Patient will benefit from skilled therapeutic intervention in order  to improve the following deficits and impairments:  Pain, Abnormal gait, Decreased balance, Decreased range of motion, Decreased strength, Difficulty walking  Visit Diagnosis: 1. Difficulty in walking, not elsewhere classified   2. Pain in right leg   3. Pain in left leg   4. Muscle weakness (generalized)   5. Left knee pain, unspecified chronicity   6. Unsteadiness on feet   7. Right knee pain, unspecified chronicity        Problem List Patient Active Problem List   Diagnosis Date Noted  . Weakness 11/03/2012    Everlean Alstrom. Graylon Good, PT, DPT 10/18/18, 3:57 PM  [facility] [address] [place], [address] Phone: [phone]   Fax:  [phone]  Name: SHANAI LARTIGUE MRN: [id number] Date of Birth: 13-Dec-1945

## 2018-10-20 ENCOUNTER — Ambulatory Visit: Payer: Medicare Other | Admitting: Physical Therapy

## 2018-10-20 ENCOUNTER — Encounter

## 2018-10-31 ENCOUNTER — Ambulatory Visit: Payer: Medicare Other | Attending: Physician Assistant | Admitting: Physical Therapy

## 2018-10-31 ENCOUNTER — Telehealth: Payer: Self-pay | Admitting: Physical Therapy
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
## 2018-11-02 ENCOUNTER — Ambulatory Visit: Payer: Medicare Other | Admitting: Physical Therapy

## 2018-11-02 DIAGNOSIS — M170 Bilateral primary osteoarthritis of knee: Secondary | ICD-10-CM | POA: Diagnosis not present

## 2018-11-02 DIAGNOSIS — M21371 Foot drop, right foot: Secondary | ICD-10-CM | POA: Diagnosis not present

## 2018-11-07 ENCOUNTER — Ambulatory Visit: Payer: Medicare Other | Admitting: Physical Therapy

## 2018-11-16 DIAGNOSIS — M25561 Pain in right knee: Secondary | ICD-10-CM | POA: Diagnosis not present

## 2018-11-16 DIAGNOSIS — M25562 Pain in left knee: Secondary | ICD-10-CM | POA: Diagnosis not present

## 2018-11-16 DIAGNOSIS — R768 Other specified abnormal immunological findings in serum: Secondary | ICD-10-CM | POA: Diagnosis not present

## 2018-11-16 DIAGNOSIS — M1180 Other specified crystal arthropathies, unspecified site: Secondary | ICD-10-CM | POA: Diagnosis not present

## 2018-11-16 DIAGNOSIS — M25461 Effusion, right knee: Secondary | ICD-10-CM | POA: Diagnosis not present

## 2018-11-16 DIAGNOSIS — M170 Bilateral primary osteoarthritis of knee: Secondary | ICD-10-CM | POA: Diagnosis not present

## 2018-12-06 ENCOUNTER — Other Ambulatory Visit: Payer: Self-pay

## 2018-12-06 ENCOUNTER — Ambulatory Visit: Payer: Medicare Other | Attending: Physician Assistant | Admitting: Physical Therapy

## 2018-12-06 DIAGNOSIS — R262 Difficulty in walking, not elsewhere classified: Secondary | ICD-10-CM

## 2018-12-06 DIAGNOSIS — M6281 Muscle weakness (generalized): Secondary | ICD-10-CM | POA: Insufficient documentation

## 2018-12-06 DIAGNOSIS — M25561 Pain in right knee: Secondary | ICD-10-CM | POA: Diagnosis not present

## 2018-12-06 DIAGNOSIS — M79604 Pain in right leg: Secondary | ICD-10-CM | POA: Insufficient documentation

## 2018-12-06 DIAGNOSIS — M25562 Pain in left knee: Secondary | ICD-10-CM | POA: Insufficient documentation

## 2018-12-06 DIAGNOSIS — M79605 Pain in left leg: Secondary | ICD-10-CM | POA: Diagnosis not present

## 2018-12-06 DIAGNOSIS — R2681 Unsteadiness on feet: Secondary | ICD-10-CM | POA: Diagnosis not present
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
## 2018-12-07 ENCOUNTER — Encounter: Payer: Self-pay | Admitting: Physical Therapy

## 2018-12-08 ENCOUNTER — Ambulatory Visit: Payer: Medicare Other | Admitting: Physical Therapy

## 2018-12-13 ENCOUNTER — Encounter: Admitting: Physical Therapy

## 2018-12-19 DIAGNOSIS — E7800 Pure hypercholesterolemia, unspecified: Secondary | ICD-10-CM | POA: Diagnosis not present

## 2018-12-19 DIAGNOSIS — M6281 Muscle weakness (generalized): Secondary | ICD-10-CM | POA: Diagnosis not present

## 2018-12-19 DIAGNOSIS — R5382 Chronic fatigue, unspecified: Secondary | ICD-10-CM | POA: Diagnosis not present

## 2018-12-19 DIAGNOSIS — G959 Disease of spinal cord, unspecified: Secondary | ICD-10-CM | POA: Diagnosis not present

## 2019-01-04 DIAGNOSIS — Z634 Disappearance and death of family member: Secondary | ICD-10-CM | POA: Diagnosis not present

## 2019-01-04 DIAGNOSIS — M6281 Muscle weakness (generalized): Secondary | ICD-10-CM | POA: Diagnosis not present

## 2019-01-04 DIAGNOSIS — F0390 Unspecified dementia without behavioral disturbance: Secondary | ICD-10-CM | POA: Diagnosis not present

## 2019-01-04 DIAGNOSIS — G959 Disease of spinal cord, unspecified: Secondary | ICD-10-CM | POA: Diagnosis not present

## 2019-01-04 DIAGNOSIS — Z7189 Other specified counseling: Secondary | ICD-10-CM | POA: Diagnosis not present

## 2019-01-07 DIAGNOSIS — Z20828 Contact with and (suspected) exposure to other viral communicable diseases: Secondary | ICD-10-CM | POA: Diagnosis not present

## 2019-02-06 ENCOUNTER — Encounter: Payer: Self-pay | Admitting: Physical Therapy

## 2019-02-21 ENCOUNTER — Other Ambulatory Visit: Payer: Self-pay

## 2019-02-21 ENCOUNTER — Encounter: Payer: Self-pay | Admitting: Physical Therapy

## 2019-02-21 ENCOUNTER — Ambulatory Visit: Payer: Medicare Other | Attending: Physician Assistant | Admitting: Physical Therapy

## 2019-02-21 DIAGNOSIS — M79605 Pain in left leg: Secondary | ICD-10-CM

## 2019-02-21 DIAGNOSIS — R2681 Unsteadiness on feet: Secondary | ICD-10-CM

## 2019-02-21 DIAGNOSIS — M79604 Pain in right leg: Secondary | ICD-10-CM

## 2019-02-21 DIAGNOSIS — M25561 Pain in right knee: Secondary | ICD-10-CM | POA: Diagnosis present

## 2019-02-21 DIAGNOSIS — R262 Difficulty in walking, not elsewhere classified: Secondary | ICD-10-CM | POA: Diagnosis present

## 2019-02-21 DIAGNOSIS — M25562 Pain in left knee: Secondary | ICD-10-CM

## 2019-02-21 DIAGNOSIS — M6281 Muscle weakness (generalized): Secondary | ICD-10-CM
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
## 2019-02-23 ENCOUNTER — Ambulatory Visit: Payer: Medicare Other | Admitting: Physical Therapy

## 2019-02-23 ENCOUNTER — Other Ambulatory Visit: Payer: Self-pay

## 2019-02-23 ENCOUNTER — Encounter: Payer: Self-pay | Admitting: Physical Therapy

## 2019-02-23 VITALS — BP 130/80

## 2019-02-23 DIAGNOSIS — M25561 Pain in right knee: Secondary | ICD-10-CM

## 2019-02-23 DIAGNOSIS — R262 Difficulty in walking, not elsewhere classified: Secondary | ICD-10-CM | POA: Diagnosis not present

## 2019-02-23 DIAGNOSIS — M6281 Muscle weakness (generalized): Secondary | ICD-10-CM

## 2019-02-23 DIAGNOSIS — M25562 Pain in left knee: Secondary | ICD-10-CM

## 2019-02-23 DIAGNOSIS — M79604 Pain in right leg: Secondary | ICD-10-CM

## 2019-02-23 DIAGNOSIS — R2681 Unsteadiness on feet: Secondary | ICD-10-CM

## 2019-02-23 DIAGNOSIS — M79605 Pain in left leg: Secondary | ICD-10-CM
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
## 2019-02-28 ENCOUNTER — Encounter: Payer: Self-pay | Admitting: Physical Therapy

## 2019-02-28 ENCOUNTER — Other Ambulatory Visit: Payer: Self-pay

## 2019-02-28 ENCOUNTER — Ambulatory Visit: Payer: Medicare Other | Admitting: Physical Therapy

## 2019-02-28 DIAGNOSIS — M79605 Pain in left leg: Secondary | ICD-10-CM

## 2019-02-28 DIAGNOSIS — R2681 Unsteadiness on feet: Secondary | ICD-10-CM

## 2019-02-28 DIAGNOSIS — M25562 Pain in left knee: Secondary | ICD-10-CM

## 2019-02-28 DIAGNOSIS — R262 Difficulty in walking, not elsewhere classified: Secondary | ICD-10-CM

## 2019-02-28 DIAGNOSIS — M25561 Pain in right knee: Secondary | ICD-10-CM

## 2019-02-28 DIAGNOSIS — M6281 Muscle weakness (generalized): Secondary | ICD-10-CM

## 2019-02-28 DIAGNOSIS — M79604 Pain in right leg: Secondary | ICD-10-CM
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
## 2019-03-02 DIAGNOSIS — R5383 Other fatigue: Secondary | ICD-10-CM | POA: Diagnosis not present

## 2019-03-07 ENCOUNTER — Ambulatory Visit: Payer: Medicare Other | Admitting: Physical Therapy

## 2019-03-07 ENCOUNTER — Encounter: Payer: Self-pay | Admitting: Physical Therapy

## 2019-03-07 ENCOUNTER — Other Ambulatory Visit: Payer: Self-pay

## 2019-03-07 DIAGNOSIS — M79605 Pain in left leg: Secondary | ICD-10-CM

## 2019-03-07 DIAGNOSIS — M25562 Pain in left knee: Secondary | ICD-10-CM

## 2019-03-07 DIAGNOSIS — R2681 Unsteadiness on feet: Secondary | ICD-10-CM

## 2019-03-07 DIAGNOSIS — R262 Difficulty in walking, not elsewhere classified: Secondary | ICD-10-CM | POA: Diagnosis not present

## 2019-03-07 DIAGNOSIS — M25561 Pain in right knee: Secondary | ICD-10-CM

## 2019-03-07 DIAGNOSIS — M79604 Pain in right leg: Secondary | ICD-10-CM

## 2019-03-07 DIAGNOSIS — M6281 Muscle weakness (generalized): Secondary | ICD-10-CM
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
## 2019-03-09 ENCOUNTER — Other Ambulatory Visit: Payer: Self-pay

## 2019-03-09 ENCOUNTER — Ambulatory Visit: Payer: Medicare Other | Admitting: Physical Therapy

## 2019-03-09 DIAGNOSIS — M6281 Muscle weakness (generalized): Secondary | ICD-10-CM

## 2019-03-09 DIAGNOSIS — R2681 Unsteadiness on feet: Secondary | ICD-10-CM

## 2019-03-09 DIAGNOSIS — M79604 Pain in right leg: Secondary | ICD-10-CM

## 2019-03-09 DIAGNOSIS — M25562 Pain in left knee: Secondary | ICD-10-CM

## 2019-03-09 DIAGNOSIS — R262 Difficulty in walking, not elsewhere classified: Secondary | ICD-10-CM | POA: Diagnosis not present

## 2019-03-09 DIAGNOSIS — M79605 Pain in left leg: Secondary | ICD-10-CM

## 2019-03-09 DIAGNOSIS — M25561 Pain in right knee: Secondary | ICD-10-CM
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
## 2019-03-14 ENCOUNTER — Other Ambulatory Visit: Payer: Self-pay

## 2019-03-14 ENCOUNTER — Encounter: Payer: Self-pay | Admitting: Physical Therapy

## 2019-03-14 ENCOUNTER — Ambulatory Visit: Payer: Medicare Other | Admitting: Physical Therapy

## 2019-03-14 DIAGNOSIS — M79605 Pain in left leg: Secondary | ICD-10-CM

## 2019-03-14 DIAGNOSIS — R262 Difficulty in walking, not elsewhere classified: Secondary | ICD-10-CM | POA: Diagnosis not present

## 2019-03-14 DIAGNOSIS — R2681 Unsteadiness on feet: Secondary | ICD-10-CM

## 2019-03-14 DIAGNOSIS — M25562 Pain in left knee: Secondary | ICD-10-CM

## 2019-03-14 DIAGNOSIS — M25561 Pain in right knee: Secondary | ICD-10-CM

## 2019-03-14 DIAGNOSIS — M6281 Muscle weakness (generalized): Secondary | ICD-10-CM

## 2019-03-14 DIAGNOSIS — M79604 Pain in right leg: Secondary | ICD-10-CM
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
## 2019-03-20 ENCOUNTER — Encounter: Payer: Self-pay | Admitting: Physical Therapy

## 2019-03-20 ENCOUNTER — Ambulatory Visit: Payer: Medicare Other | Admitting: Physical Therapy

## 2019-03-20 DIAGNOSIS — M79605 Pain in left leg: Secondary | ICD-10-CM

## 2019-03-20 DIAGNOSIS — R2681 Unsteadiness on feet: Secondary | ICD-10-CM

## 2019-03-20 DIAGNOSIS — M25562 Pain in left knee: Secondary | ICD-10-CM

## 2019-03-20 DIAGNOSIS — M6281 Muscle weakness (generalized): Secondary | ICD-10-CM

## 2019-03-20 DIAGNOSIS — R262 Difficulty in walking, not elsewhere classified: Secondary | ICD-10-CM | POA: Diagnosis not present

## 2019-03-20 DIAGNOSIS — M79604 Pain in right leg: Secondary | ICD-10-CM

## 2019-03-20 DIAGNOSIS — M25561 Pain in right knee: Secondary | ICD-10-CM
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
## 2019-03-22 ENCOUNTER — Other Ambulatory Visit: Payer: Self-pay

## 2019-03-22 ENCOUNTER — Ambulatory Visit: Payer: Medicare Other | Admitting: Physical Therapy

## 2019-03-22 DIAGNOSIS — R2681 Unsteadiness on feet: Secondary | ICD-10-CM

## 2019-03-22 DIAGNOSIS — M79604 Pain in right leg: Secondary | ICD-10-CM

## 2019-03-22 DIAGNOSIS — M6281 Muscle weakness (generalized): Secondary | ICD-10-CM

## 2019-03-22 DIAGNOSIS — M79605 Pain in left leg: Secondary | ICD-10-CM

## 2019-03-22 DIAGNOSIS — R262 Difficulty in walking, not elsewhere classified: Secondary | ICD-10-CM | POA: Diagnosis not present

## 2019-03-22 DIAGNOSIS — M25562 Pain in left knee: Secondary | ICD-10-CM

## 2019-03-22 DIAGNOSIS — M25561 Pain in right knee: Secondary | ICD-10-CM
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
## 2019-03-28 ENCOUNTER — Other Ambulatory Visit: Payer: Self-pay

## 2019-03-28 ENCOUNTER — Ambulatory Visit: Payer: Medicare Other | Attending: Physician Assistant | Admitting: Physical Therapy

## 2019-03-28 ENCOUNTER — Encounter: Payer: Self-pay | Admitting: Physical Therapy

## 2019-03-28 DIAGNOSIS — M6281 Muscle weakness (generalized): Secondary | ICD-10-CM

## 2019-03-28 DIAGNOSIS — R2681 Unsteadiness on feet: Secondary | ICD-10-CM | POA: Diagnosis not present

## 2019-03-28 DIAGNOSIS — M79604 Pain in right leg: Secondary | ICD-10-CM

## 2019-03-28 DIAGNOSIS — M25561 Pain in right knee: Secondary | ICD-10-CM | POA: Diagnosis not present

## 2019-03-28 DIAGNOSIS — M25562 Pain in left knee: Secondary | ICD-10-CM | POA: Diagnosis not present

## 2019-03-28 DIAGNOSIS — R262 Difficulty in walking, not elsewhere classified: Secondary | ICD-10-CM | POA: Diagnosis not present

## 2019-03-28 DIAGNOSIS — M545 Low back pain, unspecified: Secondary | ICD-10-CM

## 2019-03-28 DIAGNOSIS — M79605 Pain in left leg: Secondary | ICD-10-CM | POA: Diagnosis not present
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
## 2019-04-04 ENCOUNTER — Ambulatory Visit: Payer: Medicare Other | Admitting: Physical Therapy

## 2019-04-04 ENCOUNTER — Other Ambulatory Visit: Payer: Self-pay

## 2019-04-04 ENCOUNTER — Encounter: Payer: Self-pay | Admitting: Physical Therapy

## 2019-04-04 DIAGNOSIS — R2681 Unsteadiness on feet: Secondary | ICD-10-CM

## 2019-04-04 DIAGNOSIS — M25562 Pain in left knee: Secondary | ICD-10-CM

## 2019-04-04 DIAGNOSIS — M79605 Pain in left leg: Secondary | ICD-10-CM

## 2019-04-04 DIAGNOSIS — M6281 Muscle weakness (generalized): Secondary | ICD-10-CM

## 2019-04-04 DIAGNOSIS — M79604 Pain in right leg: Secondary | ICD-10-CM

## 2019-04-04 DIAGNOSIS — R262 Difficulty in walking, not elsewhere classified: Secondary | ICD-10-CM | POA: Diagnosis not present

## 2019-04-04 DIAGNOSIS — M25561 Pain in right knee: Secondary | ICD-10-CM
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
## 2019-04-11 ENCOUNTER — Other Ambulatory Visit: Payer: Self-pay

## 2019-04-11 ENCOUNTER — Ambulatory Visit: Payer: Medicare Other | Admitting: Physical Therapy

## 2019-04-11 ENCOUNTER — Encounter: Payer: Self-pay | Admitting: Physical Therapy

## 2019-04-11 DIAGNOSIS — R262 Difficulty in walking, not elsewhere classified: Secondary | ICD-10-CM | POA: Diagnosis not present

## 2019-04-11 DIAGNOSIS — M79604 Pain in right leg: Secondary | ICD-10-CM | POA: Diagnosis not present

## 2019-04-11 DIAGNOSIS — M6281 Muscle weakness (generalized): Secondary | ICD-10-CM | POA: Diagnosis not present

## 2019-04-11 DIAGNOSIS — M25562 Pain in left knee: Secondary | ICD-10-CM | POA: Diagnosis not present

## 2019-04-11 DIAGNOSIS — M79605 Pain in left leg: Secondary | ICD-10-CM | POA: Diagnosis not present

## 2019-04-11 DIAGNOSIS — R2681 Unsteadiness on feet: Secondary | ICD-10-CM | POA: Diagnosis not present

## 2019-04-11 DIAGNOSIS — M25561 Pain in right knee: Secondary | ICD-10-CM
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
## 2019-04-13 ENCOUNTER — Ambulatory Visit: Payer: Medicare Other | Admitting: Physical Therapy

## 2019-04-13 ENCOUNTER — Encounter: Payer: Self-pay | Admitting: Physical Therapy

## 2019-04-13 ENCOUNTER — Other Ambulatory Visit: Payer: Self-pay

## 2019-04-13 DIAGNOSIS — M79604 Pain in right leg: Secondary | ICD-10-CM

## 2019-04-13 DIAGNOSIS — R262 Difficulty in walking, not elsewhere classified: Secondary | ICD-10-CM | POA: Diagnosis not present

## 2019-04-13 DIAGNOSIS — M25562 Pain in left knee: Secondary | ICD-10-CM | POA: Diagnosis not present

## 2019-04-13 DIAGNOSIS — M79605 Pain in left leg: Secondary | ICD-10-CM

## 2019-04-13 DIAGNOSIS — M25561 Pain in right knee: Secondary | ICD-10-CM

## 2019-04-13 DIAGNOSIS — M6281 Muscle weakness (generalized): Secondary | ICD-10-CM

## 2019-04-13 DIAGNOSIS — R2681 Unsteadiness on feet: Secondary | ICD-10-CM
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
## 2019-04-18 ENCOUNTER — Ambulatory Visit: Payer: Medicare Other | Admitting: Physical Therapy

## 2019-04-20 ENCOUNTER — Encounter: Admitting: Physical Therapy

## 2019-04-24 ENCOUNTER — Ambulatory Visit: Payer: Medicare Other | Attending: Physician Assistant | Admitting: Physical Therapy

## 2019-04-24 ENCOUNTER — Telehealth: Payer: Self-pay | Admitting: Physical Therapy

## 2019-04-24 DIAGNOSIS — M79604 Pain in right leg: Secondary | ICD-10-CM | POA: Insufficient documentation

## 2019-04-24 DIAGNOSIS — M79605 Pain in left leg: Secondary | ICD-10-CM | POA: Insufficient documentation

## 2019-04-24 DIAGNOSIS — M25561 Pain in right knee: Secondary | ICD-10-CM | POA: Insufficient documentation

## 2019-04-24 DIAGNOSIS — M25562 Pain in left knee: Secondary | ICD-10-CM | POA: Insufficient documentation

## 2019-04-24 DIAGNOSIS — R262 Difficulty in walking, not elsewhere classified: Secondary | ICD-10-CM | POA: Insufficient documentation

## 2019-04-24 DIAGNOSIS — M6281 Muscle weakness (generalized): Secondary | ICD-10-CM | POA: Insufficient documentation

## 2019-04-24 DIAGNOSIS — R2681 Unsteadiness on feet: Secondary | ICD-10-CM | POA: Insufficient documentation

## 2019-04-24 NOTE — Telephone Encounter (Signed)
Called patient when she did not show up for her appointment today. Patient answered and said she had not looked at her schedule today because she is so used to coming tues/thurs. I offered her the openings I currently had this week tues and thurs at 9:45 and she decided to stay with her scheduled time of 2:30p on Wednesday.   Everlean Alstrom. Graylon Good, PT, DPT 04/24/19, 2:57 PM

## 2019-04-26 ENCOUNTER — Ambulatory Visit: Payer: Medicare Other | Admitting: Physical Therapy

## 2019-04-26 ENCOUNTER — Other Ambulatory Visit: Payer: Self-pay

## 2019-04-26 ENCOUNTER — Encounter: Payer: Self-pay | Admitting: Physical Therapy

## 2019-04-26 DIAGNOSIS — M79605 Pain in left leg: Secondary | ICD-10-CM

## 2019-04-26 DIAGNOSIS — R262 Difficulty in walking, not elsewhere classified: Secondary | ICD-10-CM

## 2019-04-26 DIAGNOSIS — M25562 Pain in left knee: Secondary | ICD-10-CM

## 2019-04-26 DIAGNOSIS — R2681 Unsteadiness on feet: Secondary | ICD-10-CM

## 2019-04-26 DIAGNOSIS — M6281 Muscle weakness (generalized): Secondary | ICD-10-CM | POA: Diagnosis not present

## 2019-04-26 DIAGNOSIS — M25561 Pain in right knee: Secondary | ICD-10-CM | POA: Diagnosis not present

## 2019-04-26 DIAGNOSIS — M79604 Pain in right leg: Secondary | ICD-10-CM
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
## 2019-05-02 ENCOUNTER — Encounter: Payer: Self-pay | Admitting: Physical Therapy

## 2019-05-02 ENCOUNTER — Ambulatory Visit: Payer: Medicare Other | Admitting: Physical Therapy

## 2019-05-02 ENCOUNTER — Other Ambulatory Visit: Payer: Self-pay

## 2019-05-02 DIAGNOSIS — R2681 Unsteadiness on feet: Secondary | ICD-10-CM | POA: Diagnosis not present

## 2019-05-02 DIAGNOSIS — M6281 Muscle weakness (generalized): Secondary | ICD-10-CM

## 2019-05-02 DIAGNOSIS — R262 Difficulty in walking, not elsewhere classified: Secondary | ICD-10-CM

## 2019-05-02 DIAGNOSIS — M79605 Pain in left leg: Secondary | ICD-10-CM

## 2019-05-02 DIAGNOSIS — M25562 Pain in left knee: Secondary | ICD-10-CM | POA: Diagnosis not present

## 2019-05-02 DIAGNOSIS — M79604 Pain in right leg: Secondary | ICD-10-CM | POA: Diagnosis not present

## 2019-05-02 DIAGNOSIS — M25561 Pain in right knee: Secondary | ICD-10-CM
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
## 2019-05-04 ENCOUNTER — Ambulatory Visit: Payer: Medicare Other | Admitting: Physical Therapy

## 2019-05-04 ENCOUNTER — Encounter: Payer: Self-pay | Admitting: Physical Therapy

## 2019-05-04 ENCOUNTER — Other Ambulatory Visit: Payer: Self-pay

## 2019-05-04 DIAGNOSIS — R262 Difficulty in walking, not elsewhere classified: Secondary | ICD-10-CM

## 2019-05-04 DIAGNOSIS — M25561 Pain in right knee: Secondary | ICD-10-CM

## 2019-05-04 DIAGNOSIS — M25562 Pain in left knee: Secondary | ICD-10-CM

## 2019-05-04 DIAGNOSIS — M79605 Pain in left leg: Secondary | ICD-10-CM | POA: Diagnosis not present

## 2019-05-04 DIAGNOSIS — M79604 Pain in right leg: Secondary | ICD-10-CM

## 2019-05-04 DIAGNOSIS — M6281 Muscle weakness (generalized): Secondary | ICD-10-CM | POA: Diagnosis not present

## 2019-05-04 DIAGNOSIS — R2681 Unsteadiness on feet: Secondary | ICD-10-CM
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
## 2019-05-09 ENCOUNTER — Ambulatory Visit: Payer: Medicare Other | Admitting: Physical Therapy

## 2019-05-09 ENCOUNTER — Encounter: Payer: Self-pay | Admitting: Physical Therapy

## 2019-05-09 ENCOUNTER — Other Ambulatory Visit: Payer: Self-pay

## 2019-05-09 DIAGNOSIS — M6281 Muscle weakness (generalized): Secondary | ICD-10-CM

## 2019-05-09 DIAGNOSIS — R2681 Unsteadiness on feet: Secondary | ICD-10-CM

## 2019-05-09 DIAGNOSIS — R262 Difficulty in walking, not elsewhere classified: Secondary | ICD-10-CM

## 2019-05-09 DIAGNOSIS — M79604 Pain in right leg: Secondary | ICD-10-CM

## 2019-05-09 DIAGNOSIS — M25561 Pain in right knee: Secondary | ICD-10-CM

## 2019-05-09 DIAGNOSIS — M79605 Pain in left leg: Secondary | ICD-10-CM | POA: Diagnosis not present

## 2019-05-09 DIAGNOSIS — M25562 Pain in left knee: Secondary | ICD-10-CM | POA: Diagnosis not present
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
## 2019-05-11 ENCOUNTER — Ambulatory Visit: Payer: Medicare Other | Admitting: Physical Therapy

## 2019-05-16 ENCOUNTER — Ambulatory Visit: Payer: Medicare Other | Admitting: Physical Therapy

## 2019-05-18 ENCOUNTER — Ambulatory Visit: Payer: Medicare Other | Admitting: Physical Therapy

## 2019-05-23 ENCOUNTER — Ambulatory Visit: Payer: Medicare Other | Attending: Physician Assistant | Admitting: Physical Therapy

## 2019-05-23 ENCOUNTER — Other Ambulatory Visit: Payer: Self-pay

## 2019-05-23 DIAGNOSIS — R2681 Unsteadiness on feet: Secondary | ICD-10-CM | POA: Diagnosis not present

## 2019-05-23 DIAGNOSIS — N3941 Urge incontinence: Secondary | ICD-10-CM | POA: Diagnosis not present

## 2019-05-23 DIAGNOSIS — M25562 Pain in left knee: Secondary | ICD-10-CM | POA: Diagnosis not present

## 2019-05-23 DIAGNOSIS — M6281 Muscle weakness (generalized): Secondary | ICD-10-CM | POA: Diagnosis not present

## 2019-05-23 DIAGNOSIS — M79605 Pain in left leg: Secondary | ICD-10-CM | POA: Insufficient documentation

## 2019-05-23 DIAGNOSIS — M79604 Pain in right leg: Secondary | ICD-10-CM | POA: Insufficient documentation

## 2019-05-23 DIAGNOSIS — M25561 Pain in right knee: Secondary | ICD-10-CM | POA: Diagnosis not present

## 2019-05-23 DIAGNOSIS — R262 Difficulty in walking, not elsewhere classified: Secondary | ICD-10-CM | POA: Diagnosis not present

## 2019-05-23 DIAGNOSIS — M545 Low back pain: Secondary | ICD-10-CM | POA: Diagnosis not present
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
## 2019-05-24 ENCOUNTER — Encounter: Payer: Self-pay | Admitting: Physical Therapy

## 2019-05-25 ENCOUNTER — Ambulatory Visit: Payer: Medicare Other | Admitting: Physical Therapy

## 2019-05-25 ENCOUNTER — Encounter: Payer: Self-pay | Admitting: Physical Therapy

## 2019-05-25 ENCOUNTER — Other Ambulatory Visit: Payer: Self-pay

## 2019-05-25 DIAGNOSIS — M79604 Pain in right leg: Secondary | ICD-10-CM

## 2019-05-25 DIAGNOSIS — M6281 Muscle weakness (generalized): Secondary | ICD-10-CM | POA: Diagnosis not present

## 2019-05-25 DIAGNOSIS — M79605 Pain in left leg: Secondary | ICD-10-CM

## 2019-05-25 DIAGNOSIS — M25561 Pain in right knee: Secondary | ICD-10-CM

## 2019-05-25 DIAGNOSIS — R2681 Unsteadiness on feet: Secondary | ICD-10-CM

## 2019-05-25 DIAGNOSIS — R262 Difficulty in walking, not elsewhere classified: Secondary | ICD-10-CM

## 2019-05-25 DIAGNOSIS — M25562 Pain in left knee: Secondary | ICD-10-CM
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
## 2019-05-30 ENCOUNTER — Ambulatory Visit: Payer: Medicare Other | Admitting: Physical Therapy

## 2019-05-30 ENCOUNTER — Encounter: Payer: Self-pay | Admitting: Physical Therapy

## 2019-05-30 ENCOUNTER — Other Ambulatory Visit: Payer: Self-pay

## 2019-05-30 DIAGNOSIS — R2681 Unsteadiness on feet: Secondary | ICD-10-CM

## 2019-05-30 DIAGNOSIS — M79605 Pain in left leg: Secondary | ICD-10-CM

## 2019-05-30 DIAGNOSIS — M6281 Muscle weakness (generalized): Secondary | ICD-10-CM | POA: Diagnosis not present

## 2019-05-30 DIAGNOSIS — M25561 Pain in right knee: Secondary | ICD-10-CM

## 2019-05-30 DIAGNOSIS — R262 Difficulty in walking, not elsewhere classified: Secondary | ICD-10-CM

## 2019-05-30 DIAGNOSIS — M25562 Pain in left knee: Secondary | ICD-10-CM | POA: Diagnosis not present

## 2019-05-30 DIAGNOSIS — M79604 Pain in right leg: Secondary | ICD-10-CM | POA: Diagnosis not present
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
## 2019-06-01 ENCOUNTER — Ambulatory Visit: Payer: Medicare Other | Admitting: Physical Therapy

## 2019-06-06 ENCOUNTER — Encounter: Payer: Self-pay | Admitting: Physical Therapy

## 2019-06-06 ENCOUNTER — Other Ambulatory Visit: Payer: Self-pay

## 2019-06-06 ENCOUNTER — Ambulatory Visit: Payer: Medicare Other | Admitting: Physical Therapy

## 2019-06-06 DIAGNOSIS — M6281 Muscle weakness (generalized): Secondary | ICD-10-CM | POA: Diagnosis not present

## 2019-06-06 DIAGNOSIS — M79605 Pain in left leg: Secondary | ICD-10-CM

## 2019-06-06 DIAGNOSIS — R262 Difficulty in walking, not elsewhere classified: Secondary | ICD-10-CM

## 2019-06-06 DIAGNOSIS — M25561 Pain in right knee: Secondary | ICD-10-CM

## 2019-06-06 DIAGNOSIS — M25562 Pain in left knee: Secondary | ICD-10-CM

## 2019-06-06 DIAGNOSIS — R2681 Unsteadiness on feet: Secondary | ICD-10-CM

## 2019-06-06 DIAGNOSIS — M79604 Pain in right leg: Secondary | ICD-10-CM | POA: Diagnosis not present
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
## 2019-06-07 DIAGNOSIS — Z0000 Encounter for general adult medical examination without abnormal findings: Secondary | ICD-10-CM | POA: Diagnosis not present

## 2019-06-08 ENCOUNTER — Encounter: Payer: Self-pay | Admitting: Physical Therapy

## 2019-06-08 ENCOUNTER — Other Ambulatory Visit: Payer: Self-pay

## 2019-06-08 ENCOUNTER — Ambulatory Visit: Payer: Medicare Other | Admitting: Physical Therapy

## 2019-06-08 DIAGNOSIS — M79604 Pain in right leg: Secondary | ICD-10-CM

## 2019-06-08 DIAGNOSIS — M6281 Muscle weakness (generalized): Secondary | ICD-10-CM | POA: Diagnosis not present

## 2019-06-08 DIAGNOSIS — M25562 Pain in left knee: Secondary | ICD-10-CM | POA: Diagnosis not present

## 2019-06-08 DIAGNOSIS — R2681 Unsteadiness on feet: Secondary | ICD-10-CM | POA: Diagnosis not present

## 2019-06-08 DIAGNOSIS — R262 Difficulty in walking, not elsewhere classified: Secondary | ICD-10-CM | POA: Diagnosis not present

## 2019-06-08 DIAGNOSIS — M79605 Pain in left leg: Secondary | ICD-10-CM | POA: Diagnosis not present

## 2019-06-08 DIAGNOSIS — M25561 Pain in right knee: Secondary | ICD-10-CM
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
## 2019-06-09 DIAGNOSIS — Z79899 Other long term (current) drug therapy: Secondary | ICD-10-CM | POA: Diagnosis not present

## 2019-06-09 DIAGNOSIS — E559 Vitamin D deficiency, unspecified: Secondary | ICD-10-CM | POA: Diagnosis not present

## 2019-06-09 DIAGNOSIS — R5383 Other fatigue: Secondary | ICD-10-CM | POA: Diagnosis not present

## 2019-06-09 DIAGNOSIS — G959 Disease of spinal cord, unspecified: Secondary | ICD-10-CM | POA: Diagnosis not present

## 2019-06-09 DIAGNOSIS — M6281 Muscle weakness (generalized): Secondary | ICD-10-CM | POA: Diagnosis not present

## 2019-06-09 DIAGNOSIS — F0390 Unspecified dementia without behavioral disturbance: Secondary | ICD-10-CM | POA: Diagnosis not present

## 2019-06-13 ENCOUNTER — Ambulatory Visit: Payer: Medicare Other | Admitting: Physical Therapy

## 2019-06-15 ENCOUNTER — Ambulatory Visit: Payer: Medicare Other | Admitting: Physical Therapy

## 2019-06-20 ENCOUNTER — Encounter: Payer: Self-pay | Admitting: Physical Therapy

## 2019-06-20 ENCOUNTER — Other Ambulatory Visit: Payer: Self-pay

## 2019-06-20 ENCOUNTER — Ambulatory Visit: Payer: Medicare Other | Admitting: Physical Therapy

## 2019-06-20 DIAGNOSIS — R262 Difficulty in walking, not elsewhere classified: Secondary | ICD-10-CM | POA: Diagnosis not present

## 2019-06-20 DIAGNOSIS — M25562 Pain in left knee: Secondary | ICD-10-CM

## 2019-06-20 DIAGNOSIS — M6281 Muscle weakness (generalized): Secondary | ICD-10-CM | POA: Diagnosis not present

## 2019-06-20 DIAGNOSIS — R2681 Unsteadiness on feet: Secondary | ICD-10-CM | POA: Diagnosis not present

## 2019-06-20 DIAGNOSIS — M79604 Pain in right leg: Secondary | ICD-10-CM | POA: Diagnosis not present

## 2019-06-20 DIAGNOSIS — M79605 Pain in left leg: Secondary | ICD-10-CM

## 2019-06-20 DIAGNOSIS — M25561 Pain in right knee: Secondary | ICD-10-CM

## 2019-06-20 DIAGNOSIS — M545 Low back pain, unspecified: Secondary | ICD-10-CM
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
## 2019-06-28 ENCOUNTER — Ambulatory Visit: Payer: Medicare Other | Admitting: Physical Therapy

## 2019-06-28 DIAGNOSIS — M6281 Muscle weakness (generalized): Secondary | ICD-10-CM | POA: Diagnosis not present

## 2019-06-28 DIAGNOSIS — F322 Major depressive disorder, single episode, severe without psychotic features: Secondary | ICD-10-CM | POA: Diagnosis not present

## 2019-06-28 DIAGNOSIS — G959 Disease of spinal cord, unspecified: Secondary | ICD-10-CM | POA: Diagnosis not present

## 2019-06-28 DIAGNOSIS — H9313 Tinnitus, bilateral: Secondary | ICD-10-CM | POA: Diagnosis not present

## 2019-06-28 DIAGNOSIS — F0390 Unspecified dementia without behavioral disturbance: Secondary | ICD-10-CM | POA: Diagnosis not present

## 2019-06-28 DIAGNOSIS — R5382 Chronic fatigue, unspecified: Secondary | ICD-10-CM | POA: Diagnosis not present

## 2019-07-03 ENCOUNTER — Encounter: Payer: Self-pay | Admitting: Physical Therapy

## 2019-07-03 ENCOUNTER — Ambulatory Visit: Payer: Medicare Other | Attending: Physician Assistant | Admitting: Physical Therapy

## 2019-07-03 ENCOUNTER — Other Ambulatory Visit: Payer: Self-pay

## 2019-07-03 DIAGNOSIS — R262 Difficulty in walking, not elsewhere classified: Secondary | ICD-10-CM

## 2019-07-03 DIAGNOSIS — M25562 Pain in left knee: Secondary | ICD-10-CM

## 2019-07-03 DIAGNOSIS — M6281 Muscle weakness (generalized): Secondary | ICD-10-CM | POA: Diagnosis not present

## 2019-07-03 DIAGNOSIS — R2681 Unsteadiness on feet: Secondary | ICD-10-CM

## 2019-07-03 DIAGNOSIS — M25561 Pain in right knee: Secondary | ICD-10-CM | POA: Diagnosis not present

## 2019-07-03 DIAGNOSIS — M79604 Pain in right leg: Secondary | ICD-10-CM | POA: Insufficient documentation

## 2019-07-03 DIAGNOSIS — M79605 Pain in left leg: Secondary | ICD-10-CM | POA: Insufficient documentation
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
## 2019-07-05 ENCOUNTER — Ambulatory Visit: Payer: Medicare Other | Admitting: Physical Therapy

## 2019-07-05 DIAGNOSIS — R5382 Chronic fatigue, unspecified: Secondary | ICD-10-CM | POA: Diagnosis not present

## 2019-07-05 DIAGNOSIS — B029 Zoster without complications: Secondary | ICD-10-CM | POA: Diagnosis not present

## 2019-07-05 DIAGNOSIS — G4700 Insomnia, unspecified: Secondary | ICD-10-CM | POA: Diagnosis not present

## 2019-07-05 DIAGNOSIS — K219 Gastro-esophageal reflux disease without esophagitis: Secondary | ICD-10-CM | POA: Diagnosis not present

## 2019-07-10 ENCOUNTER — Ambulatory Visit: Payer: Medicare Other | Admitting: Physical Therapy

## 2019-07-10 DIAGNOSIS — H2513 Age-related nuclear cataract, bilateral: Secondary | ICD-10-CM | POA: Diagnosis not present

## 2019-07-10 DIAGNOSIS — H25013 Cortical age-related cataract, bilateral: Secondary | ICD-10-CM | POA: Diagnosis not present

## 2019-07-10 DIAGNOSIS — H40023 Open angle with borderline findings, high risk, bilateral: Secondary | ICD-10-CM | POA: Diagnosis not present

## 2019-07-10 DIAGNOSIS — H02122 Mechanical ectropion of right lower eyelid: Secondary | ICD-10-CM | POA: Diagnosis not present

## 2019-07-12 ENCOUNTER — Ambulatory Visit: Payer: Medicare Other | Admitting: Physical Therapy

## 2019-07-12 DIAGNOSIS — M170 Bilateral primary osteoarthritis of knee: Secondary | ICD-10-CM | POA: Diagnosis not present

## 2019-07-12 DIAGNOSIS — M1180 Other specified crystal arthropathies, unspecified site: Secondary | ICD-10-CM | POA: Diagnosis not present

## 2019-07-12 DIAGNOSIS — M25461 Effusion, right knee: Secondary | ICD-10-CM | POA: Diagnosis not present

## 2019-07-12 DIAGNOSIS — R768 Other specified abnormal immunological findings in serum: Secondary | ICD-10-CM | POA: Diagnosis not present

## 2019-07-12 DIAGNOSIS — M25562 Pain in left knee: Secondary | ICD-10-CM | POA: Diagnosis not present

## 2019-07-12 DIAGNOSIS — M25561 Pain in right knee: Secondary | ICD-10-CM | POA: Diagnosis not present

## 2019-07-17 ENCOUNTER — Ambulatory Visit: Payer: Medicare Other | Admitting: Physical Therapy

## 2019-07-17 ENCOUNTER — Telehealth: Payer: Self-pay | Admitting: Physical Therapy
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
## 2019-07-18 DIAGNOSIS — H04201 Unspecified epiphora, right lacrimal gland: Secondary | ICD-10-CM | POA: Diagnosis not present

## 2019-07-18 DIAGNOSIS — H11823 Conjunctivochalasis, bilateral: Secondary | ICD-10-CM | POA: Diagnosis not present

## 2019-07-19 ENCOUNTER — Ambulatory Visit: Payer: Medicare Other | Admitting: Physical Therapy

## 2019-07-25 ENCOUNTER — Ambulatory Visit: Payer: Medicare Other | Admitting: Physical Therapy

## 2019-07-26 DIAGNOSIS — R768 Other specified abnormal immunological findings in serum: Secondary | ICD-10-CM | POA: Diagnosis not present

## 2019-07-26 DIAGNOSIS — M25561 Pain in right knee: Secondary | ICD-10-CM | POA: Diagnosis not present

## 2019-07-26 DIAGNOSIS — M170 Bilateral primary osteoarthritis of knee: Secondary | ICD-10-CM | POA: Diagnosis not present

## 2019-07-26 DIAGNOSIS — M1180 Other specified crystal arthropathies, unspecified site: Secondary | ICD-10-CM | POA: Diagnosis not present

## 2019-07-26 DIAGNOSIS — M25562 Pain in left knee: Secondary | ICD-10-CM | POA: Diagnosis not present

## 2019-07-27 ENCOUNTER — Ambulatory Visit: Payer: Medicare Other | Admitting: Physical Therapy

## 2019-07-27 DIAGNOSIS — F0390 Unspecified dementia without behavioral disturbance: Secondary | ICD-10-CM | POA: Diagnosis not present

## 2019-07-27 DIAGNOSIS — R531 Weakness: Secondary | ICD-10-CM | POA: Diagnosis not present

## 2019-07-27 DIAGNOSIS — G959 Disease of spinal cord, unspecified: Secondary | ICD-10-CM | POA: Diagnosis not present

## 2019-07-27 DIAGNOSIS — Z79899 Other long term (current) drug therapy: Secondary | ICD-10-CM | POA: Diagnosis not present

## 2019-07-27 DIAGNOSIS — R5383 Other fatigue: Secondary | ICD-10-CM | POA: Diagnosis not present

## 2019-08-01 ENCOUNTER — Other Ambulatory Visit: Payer: Self-pay

## 2019-08-01 ENCOUNTER — Ambulatory Visit: Payer: Medicare Other | Attending: Physician Assistant | Admitting: Physical Therapy

## 2019-08-01 DIAGNOSIS — M6281 Muscle weakness (generalized): Secondary | ICD-10-CM | POA: Diagnosis not present

## 2019-08-01 DIAGNOSIS — M79605 Pain in left leg: Secondary | ICD-10-CM | POA: Diagnosis not present

## 2019-08-01 DIAGNOSIS — M25562 Pain in left knee: Secondary | ICD-10-CM | POA: Insufficient documentation

## 2019-08-01 DIAGNOSIS — M79604 Pain in right leg: Secondary | ICD-10-CM | POA: Diagnosis not present

## 2019-08-01 DIAGNOSIS — M25561 Pain in right knee: Secondary | ICD-10-CM

## 2019-08-01 DIAGNOSIS — R2681 Unsteadiness on feet: Secondary | ICD-10-CM | POA: Diagnosis not present

## 2019-08-01 DIAGNOSIS — R262 Difficulty in walking, not elsewhere classified: Secondary | ICD-10-CM
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
## 2019-08-03 ENCOUNTER — Encounter: Payer: Self-pay | Admitting: Physical Therapy

## 2019-08-03 ENCOUNTER — Ambulatory Visit: Payer: Medicare Other | Admitting: Physical Therapy

## 2019-08-03 ENCOUNTER — Other Ambulatory Visit: Payer: Self-pay

## 2019-08-03 DIAGNOSIS — M25562 Pain in left knee: Secondary | ICD-10-CM | POA: Diagnosis not present

## 2019-08-03 DIAGNOSIS — M79605 Pain in left leg: Secondary | ICD-10-CM | POA: Diagnosis not present

## 2019-08-03 DIAGNOSIS — R2681 Unsteadiness on feet: Secondary | ICD-10-CM | POA: Diagnosis not present

## 2019-08-03 DIAGNOSIS — R262 Difficulty in walking, not elsewhere classified: Secondary | ICD-10-CM | POA: Diagnosis not present

## 2019-08-03 DIAGNOSIS — M6281 Muscle weakness (generalized): Secondary | ICD-10-CM | POA: Diagnosis not present

## 2019-08-03 DIAGNOSIS — M25561 Pain in right knee: Secondary | ICD-10-CM

## 2019-08-03 DIAGNOSIS — M79604 Pain in right leg: Secondary | ICD-10-CM
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
## 2019-08-06 ENCOUNTER — Encounter (HOSPITAL_COMMUNITY): Payer: Self-pay | Admitting: Emergency Medicine

## 2019-08-06 ENCOUNTER — Observation Stay (HOSPITAL_COMMUNITY): Payer: Medicare Other

## 2019-08-06 ENCOUNTER — Other Ambulatory Visit: Payer: Self-pay

## 2019-08-06 ENCOUNTER — Inpatient Hospital Stay (HOSPITAL_COMMUNITY)
Admission: EM | Admit: 2019-08-06 | Discharge: 2019-08-09 | DRG: 392 | Disposition: A | Discharge status: Home / Self Care | Payer: Medicare Other | Attending: Internal Medicine | Admitting: Internal Medicine

## 2019-08-06 DIAGNOSIS — M1990 Unspecified osteoarthritis, unspecified site: Secondary | ICD-10-CM | POA: Diagnosis present

## 2019-08-06 DIAGNOSIS — R5383 Other fatigue: Secondary | ICD-10-CM | POA: Diagnosis not present

## 2019-08-06 DIAGNOSIS — G8929 Other chronic pain: Secondary | ICD-10-CM | POA: Diagnosis not present

## 2019-08-06 DIAGNOSIS — K219 Gastro-esophageal reflux disease without esophagitis: Principal | ICD-10-CM | POA: Diagnosis present

## 2019-08-06 DIAGNOSIS — Z03818 Encounter for observation for suspected exposure to other biological agents ruled out: Secondary | ICD-10-CM | POA: Diagnosis not present

## 2019-08-06 DIAGNOSIS — F329 Major depressive disorder, single episode, unspecified: Secondary | ICD-10-CM | POA: Diagnosis present

## 2019-08-06 DIAGNOSIS — R1013 Epigastric pain: Secondary | ICD-10-CM | POA: Diagnosis not present

## 2019-08-06 DIAGNOSIS — R55 Syncope and collapse: Secondary | ICD-10-CM | POA: Diagnosis present

## 2019-08-06 DIAGNOSIS — Z79899 Other long term (current) drug therapy: Secondary | ICD-10-CM

## 2019-08-06 DIAGNOSIS — Z20822 Contact with and (suspected) exposure to covid-19: Secondary | ICD-10-CM | POA: Diagnosis not present

## 2019-08-06 DIAGNOSIS — R531 Weakness: Secondary | ICD-10-CM | POA: Diagnosis present

## 2019-08-06 DIAGNOSIS — I10 Essential (primary) hypertension: Secondary | ICD-10-CM | POA: Diagnosis not present

## 2019-08-06 DIAGNOSIS — Z8249 Family history of ischemic heart disease and other diseases of the circulatory system: Secondary | ICD-10-CM

## 2019-08-06 DIAGNOSIS — R001 Bradycardia, unspecified: Secondary | ICD-10-CM | POA: Diagnosis not present

## 2019-08-06 DIAGNOSIS — M549 Dorsalgia, unspecified: Secondary | ICD-10-CM | POA: Diagnosis present

## 2019-08-06 DIAGNOSIS — Z87891 Personal history of nicotine dependence: Secondary | ICD-10-CM

## 2019-08-06 DIAGNOSIS — F32A Depression, unspecified: Secondary | ICD-10-CM | POA: Diagnosis present

## 2019-08-06 DIAGNOSIS — Z90710 Acquired absence of both cervix and uterus: Secondary | ICD-10-CM

## 2019-08-06 HISTORY — DX: Other chronic pain: G89.29

## 2019-08-06 HISTORY — DX: Bradycardia, unspecified: R00.1

## 2019-08-06 LAB — CBC WITH DIFFERENTIAL/PLATELET
Abs Immature Granulocytes: 0.01 10*3/uL (ref 0.00–0.07)
Basophils Absolute: 0 10*3/uL (ref 0.0–0.1)
Basophils Relative: 1 %
Eosinophils Absolute: 0.1 10*3/uL (ref 0.0–0.5)
Eosinophils Relative: 2 %
HCT: 43.4 % (ref 36.0–46.0)
Hemoglobin: 13.8 g/dL (ref 12.0–15.0)
Immature Granulocytes: 0 %
Lymphocytes Relative: 35 %
Lymphs Abs: 2.2 10*3/uL (ref 0.7–4.0)
MCH: 29.9 pg (ref 26.0–34.0)
MCHC: 31.8 g/dL (ref 30.0–36.0)
MCV: 94.1 fL (ref 80.0–100.0)
Monocytes Absolute: 0.5 10*3/uL (ref 0.1–1.0)
Monocytes Relative: 8 %
Neutro Abs: 3.4 10*3/uL (ref 1.7–7.7)
Neutrophils Relative %: 54 %
Platelets: 183 10*3/uL (ref 150–400)
RBC: 4.61 MIL/uL (ref 3.87–5.11)
RDW: 16.5 % — ABNORMAL HIGH (ref 11.5–15.5)
WBC: 6.2 10*3/uL (ref 4.0–10.5)
nRBC: 0 % (ref 0.0–0.2)

## 2019-08-06 LAB — COMPREHENSIVE METABOLIC PANEL
ALT: 18 U/L (ref 0–44)
AST: 21 U/L (ref 15–41)
Albumin: 3.5 g/dL (ref 3.5–5.0)
Alkaline Phosphatase: 75 U/L (ref 38–126)
Anion gap: 13 (ref 5–15)
BUN: 20 mg/dL (ref 8–23)
CO2: 24 mmol/L (ref 22–32)
Calcium: 9 mg/dL (ref 8.9–10.3)
Chloride: 105 mmol/L (ref 98–111)
Creatinine, Ser: 1.05 mg/dL — ABNORMAL HIGH (ref 0.44–1.00)
GFR calc Af Amer: >60 mL/min (ref >60)
GFR calc non Af Amer: 53 mL/min — ABNORMAL LOW (ref >60)
Glucose, Bld: 88 mg/dL (ref 70–99)
Potassium: 4.2 mmol/L (ref 3.5–5.1)
Sodium: 142 mmol/L (ref 135–145)
Total Bilirubin: 0.6 mg/dL (ref 0.3–1.2)
Total Protein: 5.9 g/dL — ABNORMAL LOW (ref 6.5–8.1)

## 2019-08-06 LAB — CBG MONITORING, ED: Glucose-Capillary: 75 mg/dL (ref 70–99)

## 2019-08-06 LAB — TSH: TSH: 1.837 u[IU]/mL (ref 0.350–4.500)

## 2019-08-06 LAB — URINALYSIS, ROUTINE W REFLEX MICROSCOPIC
Bacteria, UA: NONE SEEN
Bilirubin Urine: NEGATIVE
Glucose, UA: NEGATIVE mg/dL
Hgb urine dipstick: NEGATIVE
Ketones, ur: NEGATIVE mg/dL
Nitrite: NEGATIVE
Protein, ur: NEGATIVE mg/dL
Specific Gravity, Urine: 1.02 (ref 1.005–1.030)
pH: 5 (ref 5.0–8.0)

## 2019-08-06 LAB — SARS CORONAVIRUS 2 BY RT PCR (HOSPITAL ORDER, PERFORMED IN ~~LOC~~ HOSPITAL LAB): SARS Coronavirus 2: NEGATIVE

## 2019-08-06 LAB — MAGNESIUM: Magnesium: 2.1 mg/dL (ref 1.7–2.4)

## 2019-08-06 MED ORDER — CLONAZEPAM 0.5 MG PO TABS
1.0000 mg | ORAL_TABLET | Freq: Every day | ORAL | Status: DC
  Administered 2019-08-07 – 2019-08-09 (×3): 1 mg via ORAL
  Filled 2019-08-06 (×3): qty 2

## 2019-08-06 MED ORDER — ACETAMINOPHEN 325 MG PO TABS: 650.0000 mg | ORAL_TABLET | Freq: Four times a day (QID) | ORAL | Status: DC | PRN

## 2019-08-06 MED ORDER — ACETAMINOPHEN 650 MG RE SUPP: 650.0000 mg | Freq: Four times a day (QID) | RECTAL | Status: DC | PRN

## 2019-08-06 MED ORDER — SODIUM CHLORIDE 0.9% FLUSH
3.0000 mL | Freq: Two times a day (BID) | INTRAVENOUS | Status: DC
  Administered 2019-08-06 – 2019-08-09 (×5): 3 mL via INTRAVENOUS

## 2019-08-06 MED ORDER — ONDANSETRON HCL 4 MG PO TABS: 4.0000 mg | ORAL_TABLET | Freq: Four times a day (QID) | ORAL | Status: DC | PRN

## 2019-08-06 MED ORDER — ENOXAPARIN SODIUM 40 MG/0.4ML ~~LOC~~ SOLN
40.0000 mg | SUBCUTANEOUS | Status: DC
  Administered 2019-08-06: 40 mg via SUBCUTANEOUS
  Filled 2019-08-06 (×2): qty 0.4

## 2019-08-06 MED ORDER — TRAZODONE HCL 50 MG PO TABS
50.0000 mg | ORAL_TABLET | Freq: Every evening | ORAL | Status: DC | PRN
  Administered 2019-08-07 – 2019-08-09 (×3): 100 mg via ORAL
  Filled 2019-08-06 (×3): qty 2

## 2019-08-06 MED ORDER — ONDANSETRON HCL 4 MG/2ML IJ SOLN: 4.0000 mg | Freq: Four times a day (QID) | INTRAMUSCULAR | Status: DC | PRN

## 2019-08-06 MED ORDER — VENLAFAXINE HCL ER 75 MG PO CP24
75.0000 mg | ORAL_CAPSULE | Freq: Every day | ORAL | Status: DC
  Administered 2019-08-07 – 2019-08-09 (×3): 75 mg via ORAL
  Filled 2019-08-06 (×3): qty 1

## 2019-08-06 NOTE — ED Notes (Signed)
Contacted lab to add on other blood work.

## 2019-08-06 NOTE — ED Notes (Signed)
Contacted microbiology to add on urine culture.

## 2019-08-06 NOTE — H&P (Addendum)
History and Physical    [name] [id number] DOB: Mar 25, 1945 DOA: 08/06/2019  PCP: Merrilee Seashore, MD  Patient coming from: Home  I have personally briefly reviewed patient's old medical records in [place]  Chief Complaint: Generalized weakness  HPI: [name] is a 74 y.o. female with medical history significant of depression, arthritis.  Pt presents to the ED with ~1 month history of generalized weakness and fatigue.  Initially having weakness with diaphoresis.  Episodes last about 5 mins.  Sometimes feeling like she will pass out.  No actual LOC.  No CP, SOB, abd pain, N/V/D.   ED Course: EKG: irregular sinus bradycardia with HR in the 40s-50s.   Review of Systems: As per HPI, otherwise all review of systems negative.  Past Medical History:  Diagnosis Date  . Arthritis   . Depression     Past Surgical History:  Procedure Laterality Date  . ABDOMINAL HYSTERECTOMY  1995  . back sugery    . BUNIONECTOMY  2013   rt foot  . COLONOSCOPY    . MUSCLE BIOPSY Left 10/03/2012   Procedure: LEFT QUADRICEP MUSCLE BIOPSY;  Surgeon: Odis Hollingshead, MD;  Location: [facility];  Service: General;  Laterality: Left;  . NECK SURGERY  2010   cerv disc fused      reports that she quit smoking about 21 years ago. She has never used smokeless tobacco. She reports that she does not drink alcohol or use drugs.  Allergies  Allergen Reactions  . Oxycontin [Oxycodone] Nausea Only  . Gadolinium Derivatives Swelling and Rash    Patient reported having rash on face and swelling of eyes the day after administration of MRI contrast on two occasions.      Family History  Problem Relation Age of Onset  . Hypertension Mother   . Cancer Sister      Prior to Admission medications   Medication Sig Start Date End Date Taking? Authorizing Provider  cetirizine (ZYRTEC) 10 MG tablet Take 10 mg by mouth daily as needed for allergies.    [provider]  oxyCODONE-acetaminophen (PERCOCET/ROXICET) 5-325 MG per tablet Take 1-2 tablets by mouth every 6 (six) hours as needed for severe pain. Patient not taking: Reported on 01/23/2016 07/18/13   Delos Haring, PA-C  pantoprazole (PROTONIX) 40 MG tablet Take 40 mg by mouth daily as needed (for acid reflux).     [provider]  triamcinolone cream (KENALOG) 0.1 % Apply 1 application topically 2 (two) times daily. 01/23/16   Scot Jun, FNP  venlafaxine XR (EFFEXOR-XR) 75 MG 24 hr capsule Take 75 mg by mouth daily with breakfast.    [provider]    Physical Exam: Vitals:   08/06/19 1802 08/06/19 1830 08/06/19 1900 08/06/19 1930  BP:  (!) 147/62 (!) 150/64 (!) 156/68  Pulse:  (!) 54 (!) 43 (!) 48  Resp:  14 15 20   Temp:      SpO2:  100% 100% 100%  Weight: 83.9 kg     Height: 5\' 9"  (1.753 m)       Constitutional: NAD, calm, comfortable Eyes: PERRL, lids and conjunctivae normal ENMT: Mucous membranes are moist. Posterior pharynx clear of any exudate or lesions.Normal dentition.  Neck: normal, supple, no masses, no thyromegaly Respiratory: clear to auscultation bilaterally, no wheezing, no crackles. Normal respiratory effort. No accessory muscle use.  Cardiovascular: Regular rate and rhythm, no murmurs / rubs / gallops. No extremity edema. 2+ pedal  pulses. No carotid bruits.  Abdomen: no tenderness, no masses palpated. No hepatosplenomegaly. Bowel sounds positive.  Musculoskeletal: no clubbing / cyanosis. No joint deformity upper and lower extremities. Good ROM, no contractures. Normal muscle tone.  Skin: no rashes, lesions, ulcers. No induration Neurologic: CN 2-12 grossly intact. Sensation intact, DTR normal. Strength 5/5 in all 4.  Psychiatric: Normal judgment and insight. Alert and oriented x 3. Normal mood.    Labs on Admission: I have personally reviewed following labs and imaging studies  CBC: Recent Labs  Lab 08/06/19 1648  WBC 6.2  NEUTROABS 3.4    HGB 13.8  HCT 43.4  MCV 94.1  PLT [id number]   Basic Metabolic Panel: Recent Labs  Lab 08/06/19 1648  NA 142  K 4.2  CL 105  CO2 24  GLUCOSE 88  BUN 20  CREATININE 1.05*  CALCIUM 9.0  MG 2.1   GFR: Estimated Creatinine Clearance: 55.2 mL/min (A) (by C-G formula based on SCr of 1.05 mg/dL (H)). Liver Function Tests: Recent Labs  Lab 08/06/19 1648  AST 21  ALT 18  ALKPHOS 75  BILITOT 0.6  PROT 5.9*  ALBUMIN 3.5   No results for input(s): LIPASE, AMYLASE in the last 168 hours. No results for input(s): AMMONIA in the last 168 hours. Coagulation Profile: No results for input(s): INR, PROTIME in the last 168 hours. Cardiac Enzymes: No results for input(s): CKTOTAL, CKMB, CKMBINDEX, TROPONINI in the last 168 hours. BNP (last 3 results) No results for input(s): PROBNP in the last 8760 hours. HbA1C: No results for input(s): HGBA1C in the last 72 hours. CBG: Recent Labs  Lab 08/06/19 1347  GLUCAP 75   Lipid Profile: No results for input(s): CHOL, HDL, LDLCALC, TRIG, CHOLHDL, LDLDIRECT in the last 72 hours. Thyroid Function Tests: Recent Labs    08/06/19 1758  TSH 1.837   Anemia Panel: No results for input(s): VITAMINB12, FOLATE, FERRITIN, TIBC, IRON, RETICCTPCT in the last 72 hours. Urine analysis:    Component Value Date/Time   COLORURINE YELLOW 08/06/2019 [place] 08/06/2019 1645   LABSPEC 1.020 08/06/2019 [place] 5.0 08/06/2019 [place] 08/06/2019 [place] 08/06/2019 [place] 08/06/2019 [place] 08/06/2019 1645   PROTEINUR NEGATIVE 08/06/2019 1645   UROBILINOGEN 0.2 04/20/2013 1136   NITRITE NEGATIVE 08/06/2019 [place] (A) 08/06/2019 1645    Radiological Exams on Admission: No results found.  EKG: Independently reviewed.  Assessment/Plan Principal Problem:   Near syncope Active Problems:   Weakness   Sinus bradycardia    1. Near syncope  / weakness / sinus bradycardia - 1. Syncope pathway 2. 2d echo 3. Tele monitor 4. Med rec pending, hold any bradycardia causing meds, but hasnt been on any in past it looks like 5. May end up needing cards consult tomorrow. 2. ? UTI - 1. Getting urine culture  DVT prophylaxis: Lovenox Code Status: Full Family Communication: Family at bedside Disposition Plan: Home after work up for bradycardia Consults called: None Admission status: Place in obs     Milarose Savich, [place] [facility]  How to contact the [facility] Attending or Consulting provider [place] or covering provider during after hours [place], for this patient?  1. Check the care team in [facility] and look for a) attending/consulting [facility] provider listed and b) the [facility] team listed 2. Log into [email or web address]  Amion Physician Scheduling and messaging for groups  and whole hospitals  On call and physician scheduling software for group practices, residents, hospitalists and other medical providers for call, clinic, rotation and shift schedules. OnCall [employer] is a hospital-wide system for scheduling doctors and paging doctors on call. EasyPlot is for scientific plotting and data analysis.  [email or web address]  and use [place]'s universal password to access. If you do not have the password, please contact the hospital operator.  3. Locate the [facility] provider you are looking for under [facility] and page to a number that you can be directly reached. 4. If you still have difficulty reaching the provider, please page the [facility] (Director on Call) for the Hospitalists listed on amion for assistance.  08/06/2019, 9:17 PM

## 2019-08-06 NOTE — ED Notes (Signed)
Pt transported to Xray. 

## 2019-08-06 NOTE — ED Triage Notes (Addendum)
Pt arrives via [facility] from home for c/o generalized fatigue x2-3 weeks, denies any other symptoms. Pt reports she is unsure the last time she felt normal. Has made her pcp aware of symptoms-states she was told to eat 5 small meals a day and take glucose tabs- reports she gets clammy at times. 22g iv in left hand. EMS VSS 155/82, HR 60, CBG 114, 100% on room air. A/ox4.

## 2019-08-06 NOTE — ED Provider Notes (Signed)
[name] Provider Note   CSN: [id number] Arrival date & time: 08/06/19  1325     History Chief Complaint  Patient presents with  . Fatigue    [name] is a 74 y.o. female.  The history is provided by the patient, the spouse and medical records. No language interpreter was used.   [name] is a 74 y.o. female who presents to the Emergency Department complaining of weakness, fatigue.  She presents to the ED for evaluation for about a month.  Initially she was having episodes of weakness with diaphoresis.  Episodes would last about five minutes and would get better if she eat and sat down.  She saw her PCP and was instructed to eat five small meals daily.  She has not checked her sugar at home.  Over the month sxs have worsened (having episodes more frequently).  Sometimes feels like she will pass out.    Denies chest pain, sob, abdominal pain, nausea, vomiting, diarrhea, constipation, leg edema, night sweats.  Might have lost a few pounds.    Denies tobacco, alcohol, drug use.      Past Medical History:  Diagnosis Date  . Arthritis   . Depression     Patient Active Problem List   Diagnosis Date Noted  . Near syncope 08/06/2019  . Sinus bradycardia 08/06/2019  . Weakness 11/03/2012    Past Surgical History:  Procedure Laterality Date  . ABDOMINAL HYSTERECTOMY  1995  . back sugery    . BUNIONECTOMY  2013   rt foot  . COLONOSCOPY    . MUSCLE BIOPSY Left 10/03/2012   Procedure: LEFT QUADRICEP MUSCLE BIOPSY;  Surgeon: Odis Hollingshead, MD;  Location: [facility];  Service: General;  Laterality: Left;  . NECK SURGERY  2010   cerv disc fused      OB History   No obstetric history on file.     Family History  Problem Relation Age of Onset  . Hypertension Mother   . Cancer Sister     Social History   Tobacco Use  . Smoking status: Former Smoker    Quit date: 03/23/1998    Years since quitting: 21.3   . Smokeless tobacco: Never Used  Substance Use Topics  . Alcohol use: No  . Drug use: No    Home Medications Prior to Admission medications   Medication Sig Start Date End Date Taking? Authorizing Provider  clonazePAM (KLONOPIN) 1 MG tablet Take 1 mg by mouth at bedtime.  06/29/19  Yes [provider]  traZODone (DESYREL) 50 MG tablet Take 50-150 mg by mouth at bedtime as needed for sleep.  06/29/19  Yes [provider]  venlafaxine XR (EFFEXOR-XR) 75 MG 24 hr capsule Take 75 mg by mouth daily with breakfast.   Yes [provider]    Allergies    Oxycontin [oxycodone] and Gadolinium derivatives  Review of Systems   Review of Systems  All other systems reviewed and are negative.   Physical Exam Updated Vital Signs BP (!) 150/67   Pulse (!) 54   Temp 98.8 F (37.1 C)   Resp 20   Ht 5\' 9"  (1.753 m)   Wt 83.9 kg   SpO2 100%   BMI 27.32 kg/m   Physical Exam Vitals and nursing note reviewed.  Constitutional:      Appearance: She is well-developed.  HENT:     Head: Normocephalic and atraumatic.  Cardiovascular:  Rate and Rhythm: Normal rate and regular rhythm.     Heart sounds: No murmur.  Pulmonary:     Effort: Pulmonary effort is normal. No respiratory distress.     Breath sounds: Normal breath sounds.  Abdominal:     Palpations: Abdomen is soft.     Tenderness: There is no abdominal tenderness. There is no guarding or rebound.  Musculoskeletal:        General: No tenderness.     Comments: Trace pitting edema to BLE  Skin:    General: Skin is warm and dry.  Neurological:     Mental Status: She is alert and oriented to person, place, and time.  Psychiatric:        Behavior: Behavior normal.     ED Results / Procedures / Treatments   Labs (all labs ordered are listed, but only abnormal results are displayed) Labs Reviewed  COMPREHENSIVE METABOLIC PANEL - Abnormal; Notable for the following components:      Result Value    Creatinine, Ser 1.05 (*)    Total Protein 5.9 (*)    GFR calc non Af Amer 53 (*)    All other components within normal limits  CBC WITH DIFFERENTIAL/PLATELET - Abnormal; Notable for the following components:   RDW 16.5 (*)    All other components within normal limits  URINALYSIS, ROUTINE W REFLEX MICROSCOPIC - Abnormal; Notable for the following components:   Leukocytes,Ua LARGE (*)    All other components within normal limits  SARS CORONAVIRUS 2 BY RT PCR (HOSPITAL ORDER, [facility])  URINE CULTURE  MAGNESIUM  TSH  CBC  BASIC METABOLIC PANEL  CBG MONITORING, ED    EKG EKG Interpretation  Date/Time:  Sunday Aug 06 2019 16:37:51 EDT Ventricular Rate:  49 PR Interval:    QRS Duration: 90 QT Interval:  471 QTC Calculation: 426 R Axis:   44 Text Interpretation: Sinus bradycardia Low voltage, precordial leads Abnormal R-wave progression, early transition Confirmed by Quintella Reichert [phone]) on 08/06/2019 5:52:14 PM   Radiology X-ray chest PA and lateral  Result Date: 08/06/2019 CLINICAL DATA:  Near syncope. Fatigue for 2-3 weeks. EXAM: CHEST - 2 VIEW COMPARISON:  By radial 119 FINDINGS: The cardiomediastinal contours are normal. The lungs are clear. Pulmonary vasculature is normal. No consolidation, pleural effusion, or pneumothorax. No acute osseous abnormalities are seen. Surgical hardware in the lower cervical spine is partially included. IMPRESSION: No acute chest findings. Electronically Signed   By: Keith Rake M.D.   On: 08/06/2019 21:42    Procedures Procedures (including critical care time)  Medications Ordered in ED Medications  sodium chloride flush (NS) 0.9 % injection 3 mL (3 mLs Intravenous Given 08/06/19 2221)  enoxaparin (LOVENOX) injection 40 mg (40 mg Subcutaneous Given 08/06/19 2213)  acetaminophen (TYLENOL) tablet 650 mg (has no administration in time range)    Or  acetaminophen (TYLENOL) suppository 650 mg (has no  administration in time range)  ondansetron (ZOFRAN) tablet 4 mg (has no administration in time range)    Or  ondansetron (ZOFRAN) injection 4 mg (has no administration in time range)  clonazePAM (KLONOPIN) tablet 1 mg (has no administration in time range)  traZODone (DESYREL) tablet 50-150 mg (has no administration in time range)  venlafaxine XR (EFFEXOR-XR) 24 hr capsule 75 mg (has no administration in time range)    ED Course  I have reviewed the triage vital signs and the nursing notes.  Pertinent labs & imaging results that were  available during my care of the patient were reviewed by me and considered in my medical decision making (see chart for details).    MDM Rules/Calculators/A&P                     Patient here for evaluation of progressive near syncopal episodes with associated diaphoresis and malaise. Symptoms are increasing in frequency and intensity. She has associated ongoing uneasiness at this time. She is non-toxic appearing on evaluation with no focal neurologic deficits. She is noted to be bradycardia with occasional sinus arrhythmia. She did not have an episode during her ED stay but there is a concern that she may have an underlying arrhythmia that may be contributing to these symptoms. Medicine consulted for observation for further evaluation. Patient and husband updated of findings of studies recommendation for admission and she is in agreement treatment plan.  Final Clinical Impression(s) / ED Diagnoses Final diagnoses:  Near syncope    Rx / DC Orders ED Discharge Orders    None       Quintella Reichert, MD 08/07/19 0025

## 2019-08-07 ENCOUNTER — Observation Stay (HOSPITAL_COMMUNITY): Payer: Medicare Other

## 2019-08-07 ENCOUNTER — Other Ambulatory Visit: Payer: Self-pay | Admitting: Physician Assistant

## 2019-08-07 ENCOUNTER — Encounter (HOSPITAL_COMMUNITY): Payer: Self-pay | Admitting: Internal Medicine

## 2019-08-07 DIAGNOSIS — R55 Syncope and collapse: Secondary | ICD-10-CM

## 2019-08-07 DIAGNOSIS — R001 Bradycardia, unspecified: Secondary | ICD-10-CM | POA: Diagnosis not present

## 2019-08-07 DIAGNOSIS — F32A Depression, unspecified: Secondary | ICD-10-CM | POA: Diagnosis present

## 2019-08-07 DIAGNOSIS — M549 Dorsalgia, unspecified: Secondary | ICD-10-CM | POA: Diagnosis present

## 2019-08-07 DIAGNOSIS — R9431 Abnormal electrocardiogram [ECG] [EKG]: Secondary | ICD-10-CM | POA: Diagnosis not present

## 2019-08-07 DIAGNOSIS — G8929 Other chronic pain: Secondary | ICD-10-CM | POA: Diagnosis present

## 2019-08-07 LAB — BASIC METABOLIC PANEL
Anion gap: 7 (ref 5–15)
BUN: 14 mg/dL (ref 8–23)
CO2: 25 mmol/L (ref 22–32)
Calcium: 8.9 mg/dL (ref 8.9–10.3)
Chloride: 109 mmol/L (ref 98–111)
Creatinine, Ser: 0.88 mg/dL (ref 0.44–1.00)
GFR calc Af Amer: >60 mL/min (ref >60)
GFR calc non Af Amer: >60 mL/min (ref >60)
Glucose, Bld: 87 mg/dL (ref 70–99)
Potassium: 3.8 mmol/L (ref 3.5–5.1)
Sodium: 141 mmol/L (ref 135–145)

## 2019-08-07 LAB — CBC
HCT: 41.6 % (ref 36.0–46.0)
Hemoglobin: 13.5 g/dL (ref 12.0–15.0)
MCH: 29.9 pg (ref 26.0–34.0)
MCHC: 32.5 g/dL (ref 30.0–36.0)
MCV: 92 fL (ref 80.0–100.0)
Platelets: 175 10*3/uL (ref 150–400)
RBC: 4.52 MIL/uL (ref 3.87–5.11)
RDW: 16.5 % — ABNORMAL HIGH (ref 11.5–15.5)
WBC: 6.2 10*3/uL (ref 4.0–10.5)
nRBC: 0 % (ref 0.0–0.2)

## 2019-08-07 LAB — GLUCOSE, CAPILLARY
Glucose-Capillary: 79 mg/dL (ref 70–99)
Glucose-Capillary: 80 mg/dL (ref 70–99)

## 2019-08-07 LAB — ECHOCARDIOGRAM COMPLETE
Height: 69 in
Weight: 3636.71 oz

## 2019-08-07 LAB — URINE CULTURE: Culture: 80000 — AB

## 2019-08-07 NOTE — Progress Notes (Signed)
Per Dr. Acie Fredrickson, patient ambulated with NT with a HR reaching the low 80's. NP Black made aware.

## 2019-08-07 NOTE — Consult Note (Addendum)
Cardiology Consultation:   Patient ID: [name]; [id number]; Sep 25, 1945   Admit date: 08/06/2019 Date of Consult: 08/07/2019  Primary Care Provider: Merrilee Seashore, MD Primary Cardiologist: [facility] Moores, MD new Primary Electrophysiologist:  None   Patient Profile:   [name] is a 74 y.o. female with a hx of glaucoma, degenerative disc disease and degenerative joint disease, depression, sinus bradycardia, who is being seen today for the evaluation of bradycardia at the request of Dr. Horris [name].  History of Present Illness:   [name] has never seen cardiology before.  Has had heart rates recorded in the 40s and low 50s as far back as 2014.  However, her heart rate at other times has been in the 80s and even over 100, so chronotropic incompetence has not been an issue.  She was admitted 5/16 with episodic generalized weakness and fatigue.  Episodes last variable times, but less than 1 hr, and some are associated with presyncope.  Her heart rate was in the 40s at times after admit, cardiology asked to evaluate.  [name] started having these spells about a month ago. They are all very similar. She will get a feeling of general malaise and weakness. She will feel terrible. The symptoms will pass in < 1 hr.   The episodes have increased in frequency since they started.   She saw her MD, who thought she might be having low blood sugar episodes. She changed her dietary habits but that did not help.   She never checked her blood sugar or BP/HR during one of the spells.  Yesterday, she had an episode that bothered her a great deal. She came to the hospital by EMS. Her symptoms resolved and have not returned since admission.    Past Medical History:  Diagnosis Date  . Arthritis   . Bilateral chronic knee pain   . Chronic back pain   . Depression   . Sinus bradycardia     Past Surgical History:  Procedure Laterality Date  . ABDOMINAL HYSTERECTOMY  1995  . back sugery     . BUNIONECTOMY  2013   rt foot  . COLONOSCOPY    . MUSCLE BIOPSY Left 10/03/2012   Procedure: LEFT QUADRICEP MUSCLE BIOPSY;  Surgeon: Odis Hollingshead, MD;  Location: [facility];  Service: General;  Laterality: Left;  . NECK SURGERY  2010   cerv disc fused      Prior to Admission medications   Medication Sig Start Date End Date Taking? Authorizing Provider  clonazePAM (KLONOPIN) 1 MG tablet Take 1 mg by mouth at bedtime.  06/29/19  Yes [provider]  traZODone (DESYREL) 50 MG tablet Take 50-150 mg by mouth at bedtime as needed for sleep.  06/29/19  Yes [provider]  venlafaxine XR (EFFEXOR-XR) 75 MG 24 hr capsule Take 75 mg by mouth daily with breakfast.   Yes [provider]    Inpatient Medications: Scheduled Meds: . clonazePAM  1 mg Oral QHS  . enoxaparin (LOVENOX) injection  40 mg Subcutaneous Q24H  . sodium chloride flush  3 mL Intravenous Q12H  . venlafaxine XR  75 mg Oral Q breakfast   Continuous Infusions:  PRN Meds: acetaminophen **OR** acetaminophen, ondansetron **OR** ondansetron (ZOFRAN) IV, traZODone  Allergies:    Allergies  Allergen Reactions  . Oxycontin [Oxycodone] Nausea Only  . Gadolinium Derivatives Swelling and Rash    Patient reported having rash on face and swelling of eyes the day after administration of  MRI contrast on two occasions.      Social History:   Social History   Socioeconomic History  . Marital status: Married    Spouse name: Not on file  . Number of children: Not on file  . Years of education: Not on file  . Highest education level: Not on file  Occupational History  . Not on file  Tobacco Use  . Smoking status: Former Smoker    Quit date: 03/23/1998    Years since quitting: 21.3  . Smokeless tobacco: Never Used  Substance and Sexual Activity  . Alcohol use: No  . Drug use: No  . Sexual activity: Not on file  Other Topics Concern  . Not on file  Social History Narrative  . Not on  file   Social Determinants of Health   Financial Resource Strain:   . Difficulty of Paying Living Expenses:   Food Insecurity:   . Worried About Charity fundraiser in the Last Year:   . Arboriculturist in the Last Year:   Transportation Needs:   . Film/video editor (Medical):   Marland Kitchen Lack of Transportation (Non-Medical):   Physical Activity:   . Days of Exercise per Week:   . Minutes of Exercise per Session:   Stress:   . Feeling of Stress :   Social Connections:   . Frequency of Communication with Friends and Family:   . Frequency of Social Gatherings with Friends and Family:   . Attends Religious Services:   . Active Member of Clubs or Organizations:   . Attends Archivist Meetings:   Marland Kitchen Marital Status:   Intimate Partner Violence:   . Fear of Current or Ex-Partner:   . Emotionally Abused:   Marland Kitchen Physically Abused:   . Sexually Abused:     Family History:   Family History  Problem Relation Age of Onset  . Hypertension Mother   . Cancer Sister    Family Status:  Family Status  Relation Name Status  . Mother  Deceased  . Father  Deceased  . Sister  Alive    ROS:  Please see the history of present illness.  All other ROS reviewed and negative.     Physical Exam/Data:   Vitals:   08/07/19 0115 08/07/19 0253 08/07/19 0553 08/07/19 1216  BP:   125/64 (!) 123/56  Pulse: (!) 52  61 (!) 57  Resp: 14 16 17 16   Temp:  98.2 F (36.8 C) (!) 97.5 F (36.4 C) 98 F (36.7 C)  TempSrc:  Oral Oral Oral  SpO2: 100% 100% 99% 95%  Weight:  103.1 kg    Height:       No intake or output data in the 24 hours ending 08/07/19 1430  Last 3 Weights 08/07/2019 08/06/2019 01/23/2016  Weight (lbs) 227 lb 4.7 oz 185 lb 237 lb  Weight (kg) 103.1 kg 83.915 kg 107.502 kg     Body mass index is 33.57 kg/m.   General:  Well nourished, well developed, female in no acute distress HEENT: normal Lymph: no adenopathy Neck: JVD - not elevated Endocrine:  No  thryomegaly Vascular: No carotid bruits; 4/4 extremity pulses 2+  Cardiac:  normal S1, S2; RRR; no murmur Lungs:  clear bilaterally, no wheezing, rhonchi or rales  Abd: soft, nontender, no hepatomegaly  Ext: no edema Musculoskeletal:  No deformities, BUE and BLE strength normal and equal Skin: warm and dry  Neuro:  CNs 2-12 intact, no focal  abnormalities noted Psych:  Normal affect   EKG:  The EKG was personally reviewed and demonstrates: Sinus bradycardia, heart rate 49, decreased complex amplitude V1-3 Telemetry:  Telemetry was personally reviewed and demonstrates: Sinus rhythm/sinus bradycardia, sustained in the 50s and occasionally dropping into the 40s   CV studies:   ECHO: 08/07/2019 1. Left ventricular ejection fraction, by estimation, is 65 to 70%. The  left ventricle has normal function. The left ventricle has no regional  wall motion abnormalities. Left ventricular diastolic parameters are  consistent with age-related delayed  relaxation (normal).  2. Right ventricular systolic function is normal. The right ventricular  size is normal.  3. The mitral valve is normal in structure. Trivial mitral valve  regurgitation. No evidence of mitral stenosis.  4. The aortic valve is tricuspid. Aortic valve regurgitation is not  visualized. Mild aortic valve sclerosis is present, with no evidence of  aortic valve stenosis.  5. The inferior vena cava is normal in size with greater than 50%  respiratory variability, suggesting right atrial pressure of 3 mmHg.    Laboratory Data:   Chemistry Recent Labs  Lab 08/06/19 1648 08/07/19 0258  NA 142 141  K 4.2 3.8  CL 105 109  CO2 24 25  GLUCOSE 88 87  BUN 20 14  CREATININE 1.05* 0.88  CALCIUM 9.0 8.9  GFRNONAA 53* >60  GFRAA >60 >60  ANIONGAP 13 7    Lab Results  Component Value Date   ALT 18 08/06/2019   AST 21 08/06/2019   ALKPHOS 75 08/06/2019   BILITOT 0.6 08/06/2019   Hematology Recent Labs  Lab  08/06/19 1648 08/07/19 0258  WBC 6.2 6.2  RBC 4.61 4.52  HGB 13.8 13.5  HCT 43.4 41.6  MCV 94.1 92.0  MCH 29.9 29.9  MCHC 31.8 32.5  RDW 16.5* 16.5*  PLT 183 175   Cardiac Enzymes High Sensitivity Troponin:  No results for input(s): TROPONINIHS in the last 720 hours.    BNPNo results for input(s): BNP, PROBNP in the last 168 hours.   TSH:  Lab Results  Component Value Date   TSH 1.837 08/06/2019   Lipids:No results found for: CHOL, HDL, LDLCALC, LDLDIRECT, TRIG, CHOLHDL HgbA1c:No results found for: HGBA1C Magnesium:  Magnesium  Date Value Ref Range Status  08/06/2019 2.1 1.7 - 2.4 mg/dL Final    Comment:    Performed at [facility] [address], [place], [place] [address]     Radiology/Studies:  X-ray chest PA and lateral  Result Date: 08/06/2019 CLINICAL DATA:  Near syncope. Fatigue for 2-3 weeks. EXAM: CHEST - 2 VIEW COMPARISON:  By radial 119 FINDINGS: The cardiomediastinal contours are normal. The lungs are clear. Pulmonary vasculature is normal. No consolidation, pleural effusion, or pneumothorax. No acute osseous abnormalities are seen. Surgical hardware in the lower cervical spine is partially included. IMPRESSION: No acute chest findings. Electronically Signed   By: Keith Rake M.D.   On: 08/06/2019 21:42   ECHOCARDIOGRAM COMPLETE  Result Date: 08/07/2019    ECHOCARDIOGRAM REPORT   Patient Name:   NIALA CABBAGESTALK Date of Exam: 08/07/2019 Medical Rec #:  [id number]     Height:       69.0 in Accession #:    [id number]    Weight:       227.3 lb Date of Birth:  1945-03-29     BSA:          2.181 m Patient Age:    45 years  BP:           125/64 mmHg Patient Gender: F             HR:           61 bpm. Exam Location:  Inpatient Procedure: 2D Echo, Cardiac Doppler, Color Doppler and Strain Analysis Indications:    Syncope 780.2 / R55                 Abnormal ECG 794.31 / R94.31  History:        Patient has no prior history of Echocardiogram examinations. 1                  month history of generalized weakness and fatigue. Sinus                 bradycardia.  Sonographer:    Darlina Sicilian RDCS Referring Phys: [place]  1. Left ventricular ejection fraction, by estimation, is 65 to 70%. The left ventricle has normal function. The left ventricle has no regional wall motion abnormalities. Left ventricular diastolic parameters are consistent with age-related delayed relaxation (normal).  2. Right ventricular systolic function is normal. The right ventricular size is normal.  3. The mitral valve is normal in structure. Trivial mitral valve regurgitation. No evidence of mitral stenosis.  4. The aortic valve is tricuspid. Aortic valve regurgitation is not visualized. Mild aortic valve sclerosis is present, with no evidence of aortic valve stenosis.  5. The inferior vena cava is normal in size with greater than 50% respiratory variability, suggesting right atrial pressure of 3 mmHg. Comparison(s): No prior Echocardiogram. Conclusion(s)/Recommendation(s): Normal biventricular function without evidence of hemodynamically significant valvular heart disease. FINDINGS  Left Ventricle: Left ventricular ejection fraction, by estimation, is 65 to 70%. The left ventricle has normal function. The left ventricle has no regional wall motion abnormalities. The left ventricular internal cavity size was normal in size. There is  no left ventricular hypertrophy. Left ventricular diastolic parameters are consistent with age-related delayed relaxation (normal). Right Ventricle: The right ventricular size is normal. No increase in right ventricular wall thickness. Right ventricular systolic function is normal. Left Atrium: Left atrial size was normal in size. Right Atrium: Right atrial size was normal in size. Pericardium: Trivial pericardial effusion is present. Mitral Valve: The mitral valve is normal in structure. Trivial mitral valve regurgitation. No evidence of mitral  valve stenosis. Tricuspid Valve: The tricuspid valve is normal in structure. Tricuspid valve regurgitation is trivial. No evidence of tricuspid stenosis. Aortic Valve: The aortic valve is tricuspid. Aortic valve regurgitation is not visualized. Mild aortic valve sclerosis is present, with no evidence of aortic valve stenosis. There is mild calcification of the aortic valve. Pulmonic Valve: The pulmonic valve was not well visualized. Pulmonic valve regurgitation is not visualized. No evidence of pulmonic stenosis. Aorta: The aortic root and ascending aorta are structurally normal, with no evidence of dilitation. Venous: The inferior vena cava is normal in size with greater than 50% respiratory variability, suggesting right atrial pressure of 3 mmHg. IAS/Shunts: No atrial level shunt detected by color flow Doppler.  LEFT VENTRICLE PLAX 2D LVIDd:         5.11 cm  Diastology LVIDs:         3.98 cm  LV e' lateral:   9.20 cm/s LV PW:         0.82 cm  LV E/e' lateral: 11.0 LV IVS:        0.84  cm  LV e' medial:    7.67 cm/s LVOT diam:     1.90 cm  LV E/e' medial:  13.2 LV SV:         50 LV SV Index:   23       2D Longitudinal Strain LVOT Area:     2.84 cm 2D Strain GLS (A2C):   -22.3 %                         2D Strain GLS (A3C):   -26.4 %                         2D Strain GLS (A4C):   -21.8 %                         2D Strain GLS Avg:     -23.4 % RIGHT VENTRICLE RV S prime:     11.60 cm/s TAPSE (M-mode): 1.8 cm LEFT ATRIUM             Index       RIGHT ATRIUM           Index LA diam:        4.00 cm 1.83 cm/m  RA Area:     15.80 cm LA Vol (A2C):   39.5 ml 18.11 ml/m RA Volume:   37.00 ml  16.96 ml/m LA Vol (A4C):   55.9 ml 25.63 ml/m LA Biplane Vol: 47.2 ml 21.64 ml/m  AORTIC VALVE LVOT Vmax:   86.20 cm/s LVOT Vmean:  54.900 cm/s LVOT VTI:    0.176 m  AORTA Ao Root diam: 3.10 cm MITRAL VALVE MV Area (PHT): 3.72 cm     SHUNTS MV Decel Time: 204 msec     Systemic VTI:  0.18 m MV E velocity: 101.00 cm/s  Systemic Diam:  1.90 cm MV A velocity: 83.10 cm/s MV E/A ratio:  1.22 Buford Dresser MD Electronically signed by Buford Dresser MD Signature Date/Time: 08/07/2019/2:24:07 PM    Final     Assessment and Plan:   1.  Near syncopal episodes - no recurrence since on telemetry - suggested she check her blood sugar the next time she gets one, but suspect it will be ok (no hx DM). - may need Zio to see if HR drops when she gets these spells. - no evidence of chronotropic incompetence, but will get staff to walk her and make sure.  - TSH ok, no rate-lowering rx - no recent change in her health, no new OTC meds, no unusual foods/drink  Otherwise, per IM Principal Problem:   Near syncope Active Problems:   Weakness   Sinus bradycardia   Chronic back pain   Depression     For questions or updates, please contact [facility] Please consult [email or web address] for contact info under Cardiology/STEMI.   Signed, Rosaria Ferries, PA-C  08/07/2019 2:30 PM   Attending Note:   The patient was seen and examined.  Agree with assessment and plan as noted above.  Changes made to the above note as needed.  Patient seen and independently examined with  Rosaria Ferries, PA .   We discussed all aspects of the encounter. I agree with the assessment and plan as stated above.  1.  Episodic weakness: Her symptoms sound fairly atypical to be related to bradycardia.  She has been bradycardic for many years but these episodes of generalized  weakness only started several weeks ago.  In addition, she is never actually had true syncope but has had what might be called near syncope.  Most of her symptoms are a profound weakness without specific lightheadedness or blackout.  We ambulated her in the halls.  Her heart rate increased from 60 up to 90 with ambulation of approximately 30 to 40 feet.  This shows that her chronotropic response is intact.  TSH is normal   Her echocardiogram shows no significant valvular  abnormalities.  She has normal left ventricular systolic function.  At this point I am not sure if there is any further work-up that she needs in the hospital.  At discharge we will arrange to have a Zio patch monitor mailed out to her.  We will follow up with her in the office.  Her symptoms may be due to low BP.  Ive asked her to monitor her BP and to keep a record.   She has already tried eating more frequent , smaller meals to see if that will help.   From a cardiology standpoint, she may be discharged today and follow up with [place] in the office in several weeks .   2.  Sinus bradycardia:    She has had episodes of sinus bradycardia for years.   We found slow HR dating back to 2014.  She has not had symptoms with this bradycardia.   Her current "weak spells" / "near syncope" episodes started several weeks ago    I have spent a total of 40 minutes with patient reviewing hospital  notes , telemetry, EKGs, labs and examining patient as well as establishing an assessment and plan that was discussed with the patient. > 50% of time was spent in direct patient care.    Thayer Headings, Brooke Bonito., MD, [facility] 08/07/2019, 2:42 PM [id number] N. [address]  [place] Pager [phone]

## 2019-08-07 NOTE — Progress Notes (Signed)
  Echocardiogram 2D Echocardiogram has been performed.  [name] M 08/07/2019, 10:04 AM

## 2019-08-07 NOTE — Progress Notes (Addendum)
Patient is feeling weak after her walk with the NT. Says she started to feel sweaty and has "weakness" in her sternum area. VS stable. HR currently 53. BG was 80. She does not feel ready to discharge today. MD Acie Fredrickson, MD Horris Latino, and NP [facility] paged. Will continue to monitor.

## 2019-08-07 NOTE — Discharge Summary (Signed)
Physician Discharge Summary  [name] [id number] DOB: 04-14-1945 DOA: 08/06/2019  PCP: Merrilee Seashore, MD  Admit date: 08/06/2019 Discharge date: 08/07/2019  Admitted From: home Discharge disposition: home   Recommendations for Outpatient Follow-Up:   1. Cardiology will mail event monitor to patient 2. Follow up with cardiology in 4-5 weeks for evaluation of event monitor data   Discharge Diagnosis:   Principal Problem:   Near syncope Active Problems:   Sinus bradycardia   Weakness   Depression   Chronic back pain    Discharge Condition: Improved.  Diet recommendation: Low sodium, heart healthy.  Carbohydrate-modified.    Wound care: None.  Code status: Full.   History of Present Illness:   [name] is a 74 y.o. female with medical history significant of depression, arthritis. Pt presented to the ED with ~1 month history of generalized weakness and fatigue.  Initially having weakness with diaphoresis.  Episodes last about 5 mins.  Sometimes feeling like she will pass out.  No actual LOC.  No CP, SOB, abd pain, N/V/D.    Hospital Course by Problem:   #1.  Near syncope/generalized weakness/sinus bradycardia.  Patient presented with 1 month history of generalized weakness and fatigue.  Also episodes of acute weakness with diaphoresis.  Denied chest pain shortness of breath headache dizziness visual disturbances.  Does admit to intermittent lower extremity edema.  Occasion she "feels like I am going to pass out".  No history of CAD, CHF.  No signs of infection, no metabolic derangement, magnesium and TSH within the limits of normal.  Capillary blood sugar 79.  X-ray no acute findings.  Not orthostatic.  EKG does show sinus bradycardia.  Trace to 1+ pitting edema lower extremities.echo with normal EF. Evaluated by cardiology who opine symptoms atypical to be related to bradycardia and never actually had true syncope. She ambulated in hall and HR  upt to 60-80 range. Cards recommended Zio patch monitor which will be mailed to her. Follow up 4-5 weeks with cardiology.   #2.  Chronic pain.  Patient with a history of multiple surgeries on her C-spine and lumbar spine.  Current review indicates actively involved with physical therapy.  Reports pain level at baseline.  No pain medicine on home medication list -Monitor  #3.  Depression.  Stable at baseline -Continue home meds    Medical Consultants:   [facility]   Discharge Exam:   Vitals:   08/07/19 0553 08/07/19 1216  BP: 125/64 (!) 123/56  Pulse: 61 (!) 57  Resp: 17 16  Temp: (!) 97.5 F (36.4 C) 98 F (36.7 C)  SpO2: 99% 95%   Vitals:   08/07/19 0115 08/07/19 0253 08/07/19 0553 08/07/19 1216  BP:   125/64 (!) 123/56  Pulse: (!) 52  61 (!) 57  Resp: 14 16 17 16   Temp:  98.2 F (36.8 C) (!) 97.5 F (36.4 C) 98 F (36.7 C)  TempSrc:  Oral Oral Oral  SpO2: 100% 100% 99% 95%  Weight:  103.1 kg    Height:        General exam: Appears calm and comfortable.  Respiratory system: Clear to auscultation. Respiratory effort normal. Cardiovascular system: S1 & S2 heard, RRR. No JVD,  rubs, gallops or clicks. No murmurs. Gastrointestinal system: Abdomen is nondistended, soft and nontender. No organomegaly or masses felt. Normal bowel sounds heard. Central nervous system: Alert and oriented. No focal neurological deficits. Extremities: No clubbing,  or cyanosis. No edema.  Skin: No rashes, lesions or ulcers. Psychiatry: Judgement and insight appear normal. Mood & affect appropriate.    The results of significant diagnostics from this hospitalization (including imaging, microbiology, ancillary and laboratory) are listed below for reference.     Procedures and Diagnostic Studies:   X-ray chest PA and lateral  Result Date: 08/06/2019 CLINICAL DATA:  Near syncope. Fatigue for 2-3 weeks. EXAM: CHEST - 2 VIEW COMPARISON:  By radial 119 FINDINGS: The cardiomediastinal  contours are normal. The lungs are clear. Pulmonary vasculature is normal. No consolidation, pleural effusion, or pneumothorax. No acute osseous abnormalities are seen. Surgical hardware in the lower cervical spine is partially included. IMPRESSION: No acute chest findings. Electronically Signed   By: Keith Rake M.D.   On: 08/06/2019 21:42   ECHOCARDIOGRAM COMPLETE  Result Date: 08/07/2019    ECHOCARDIOGRAM REPORT   Patient Name:   [name] Date of Exam: 08/07/2019 Medical Rec #:  [id number]     Height:       69.0 in Accession #:    [id number]    Weight:       227.3 lb Date of Birth:  05-Dec-1945     BSA:          2.181 m Patient Age:    12 years      BP:           125/64 mmHg Patient Gender: F             HR:           61 bpm. Exam Location:  Inpatient Procedure: 2D Echo, Cardiac Doppler, Color Doppler and Strain Analysis Indications:    Syncope 780.2 / R55                 Abnormal ECG 794.31 / R94.31  History:        Patient has no prior history of Echocardiogram examinations. 1                 month history of generalized weakness and fatigue. Sinus                 bradycardia.  Sonographer:    Darlina Sicilian RDCS Referring Phys: [place]  1. Left ventricular ejection fraction, by estimation, is 65 to 70%. The left ventricle has normal function. The left ventricle has no regional wall motion abnormalities. Left ventricular diastolic parameters are consistent with age-related delayed relaxation (normal).  2. Right ventricular systolic function is normal. The right ventricular size is normal.  3. The mitral valve is normal in structure. Trivial mitral valve regurgitation. No evidence of mitral stenosis.  4. The aortic valve is tricuspid. Aortic valve regurgitation is not visualized. Mild aortic valve sclerosis is present, with no evidence of aortic valve stenosis.  5. The inferior vena cava is normal in size with greater than 50% respiratory variability, suggesting right atrial  pressure of 3 mmHg. Comparison(s): No prior Echocardiogram. Conclusion(s)/Recommendation(s): Normal biventricular function without evidence of hemodynamically significant valvular heart disease. FINDINGS  Left Ventricle: Left ventricular ejection fraction, by estimation, is 65 to 70%. The left ventricle has normal function. The left ventricle has no regional wall motion abnormalities. The left ventricular internal cavity size was normal in size. There is  no left ventricular hypertrophy. Left ventricular diastolic parameters are consistent with age-related delayed relaxation (normal). Right Ventricle: The right ventricular size is normal. No increase in right ventricular wall thickness. Right ventricular systolic function is normal. Left  Atrium: Left atrial size was normal in size. Right Atrium: Right atrial size was normal in size. Pericardium: Trivial pericardial effusion is present. Mitral Valve: The mitral valve is normal in structure. Trivial mitral valve regurgitation. No evidence of mitral valve stenosis. Tricuspid Valve: The tricuspid valve is normal in structure. Tricuspid valve regurgitation is trivial. No evidence of tricuspid stenosis. Aortic Valve: The aortic valve is tricuspid. Aortic valve regurgitation is not visualized. Mild aortic valve sclerosis is present, with no evidence of aortic valve stenosis. There is mild calcification of the aortic valve. Pulmonic Valve: The pulmonic valve was not well visualized. Pulmonic valve regurgitation is not visualized. No evidence of pulmonic stenosis. Aorta: The aortic root and ascending aorta are structurally normal, with no evidence of dilitation. Venous: The inferior vena cava is normal in size with greater than 50% respiratory variability, suggesting right atrial pressure of 3 mmHg. IAS/Shunts: No atrial level shunt detected by color flow Doppler.  LEFT VENTRICLE PLAX 2D LVIDd:         5.11 cm  Diastology LVIDs:         3.98 cm  LV e' lateral:   9.20 cm/s LV  PW:         0.82 cm  LV E/e' lateral: 11.0 LV IVS:        0.84 cm  LV e' medial:    7.67 cm/s LVOT diam:     1.90 cm  LV E/e' medial:  13.2 LV SV:         50 LV SV Index:   23       2D Longitudinal Strain LVOT Area:     2.84 cm 2D Strain GLS (A2C):   -22.3 %                         2D Strain GLS (A3C):   -26.4 %                         2D Strain GLS (A4C):   -21.8 %                         2D Strain GLS Avg:     -23.4 % RIGHT VENTRICLE RV S prime:     11.60 cm/s TAPSE (M-mode): 1.8 cm LEFT ATRIUM             Index       RIGHT ATRIUM           Index LA diam:        4.00 cm 1.83 cm/m  RA Area:     15.80 cm LA Vol (A2C):   39.5 ml 18.11 ml/m RA Volume:   37.00 ml  16.96 ml/m LA Vol (A4C):   55.9 ml 25.63 ml/m LA Biplane Vol: 47.2 ml 21.64 ml/m  AORTIC VALVE LVOT Vmax:   86.20 cm/s LVOT Vmean:  54.900 cm/s LVOT VTI:    0.176 m  AORTA Ao Root diam: 3.10 cm MITRAL VALVE MV Area (PHT): 3.72 cm     SHUNTS MV Decel Time: 204 msec     Systemic VTI:  0.18 m MV E velocity: 101.00 cm/s  Systemic Diam: 1.90 cm MV A velocity: 83.10 cm/s MV E/A ratio:  1.22 Buford Dresser MD Electronically signed by Buford Dresser MD Signature Date/Time: 08/07/2019/2:24:07 PM    Final      Labs:   Basic Metabolic Panel: Recent Labs  Lab 08/06/19 1648 08/07/19 0258  NA 142 141  K 4.2 3.8  CL 105 109  CO2 24 25  GLUCOSE 88 87  BUN 20 14  CREATININE 1.05* 0.88  CALCIUM 9.0 8.9  MG 2.1  --    GFR Estimated Creatinine Clearance: 72.8 mL/min (by C-G formula based on SCr of 0.88 mg/dL). Liver Function Tests: Recent Labs  Lab 08/06/19 1648  AST 21  ALT 18  ALKPHOS 75  BILITOT 0.6  PROT 5.9*  ALBUMIN 3.5   No results for input(s): LIPASE, AMYLASE in the last 168 hours. No results for input(s): AMMONIA in the last 168 hours. Coagulation profile No results for input(s): INR, PROTIME in the last 168 hours.  CBC: Recent Labs  Lab 08/06/19 1648 08/07/19 0258  WBC 6.2 6.2  NEUTROABS 3.4  --   HGB  13.8 13.5  HCT 43.4 41.6  MCV 94.1 92.0  PLT 183 175   Cardiac Enzymes: No results for input(s): CKTOTAL, CKMB, CKMBINDEX, TROPONINI in the last 168 hours. BNP: Invalid input(s): POCBNP CBG: Recent Labs  Lab 08/06/19 1347 08/07/19 0558  GLUCAP 75 79   D-Dimer No results for input(s): DDIMER in the last 72 hours. Hgb A1c No results for input(s): HGBA1C in the last 72 hours. Lipid Profile No results for input(s): CHOL, HDL, LDLCALC, TRIG, CHOLHDL, LDLDIRECT in the last 72 hours. Thyroid function studies Recent Labs    08/06/19 1758  TSH 1.837   Anemia work up No results for input(s): VITAMINB12, FOLATE, FERRITIN, TIBC, IRON, RETICCTPCT in the last 72 hours. Microbiology Recent Results (from the past 240 hour(s))  Urine culture     Status: Abnormal   Collection Time: 08/06/19  4:45 PM   Specimen: Urine, Random  Result Value Ref Range Status   Specimen Description URINE, RANDOM  Final   Special Requests NONE  Final   Culture (A)  Final    80,000 COLONIES/mL GROUP B STREP(S.AGALACTIAE)ISOLATED TESTING AGAINST S. AGALACTIAE NOT ROUTINELY PERFORMED DUE TO PREDICTABILITY OF AMP/PEN/VAN SUSCEPTIBILITY. Performed at [facility], [place] [address] [place], [place] [address]    Report Status 08/07/2019 FINAL  Final  SARS Coronavirus 2 by RT PCR (hospital order, performed in [facility] hospital lab) Nasopharyngeal Nasopharyngeal Swab     Status: None   Collection Time: 08/06/19  9:21 PM   Specimen: Nasopharyngeal Swab  Result Value Ref Range Status   SARS Coronavirus 2 NEGATIVE NEGATIVE Final    Comment: (NOTE) SARS-CoV-2 target nucleic acids are NOT DETECTED. The SARS-CoV-2 RNA is generally detectable in upper and lower respiratory specimens during the acute phase of infection. The lowest concentration of SARS-CoV-2 viral copies this assay can detect is 250 copies / mL. A negative result does not preclude SARS-CoV-2 infection and should not be used as the sole basis  for treatment or other patient management decisions.  A negative result may occur with improper specimen collection / handling, submission of specimen other than nasopharyngeal swab, presence of viral mutation(s) within the areas targeted by this assay, and inadequate number of viral copies (<250 copies / mL). A negative result must be combined with clinical observations, patient history, and epidemiological information. Fact Sheet for Patients:   StrictlyIdeas.no Fact Sheet for Healthcare Providers: BankingDealers.co.za This test is not yet approved or cleared  by the [place] FDA and has been authorized for detection and/or diagnosis of SARS-CoV-2 by FDA under an Emergency Use Authorization (EUA).  This EUA will remain in effect (meaning this test  can be used) for the duration of the COVID-19 declaration under Section 564(b)(1) of the Act, 21 U.S.C. section 360bbb-3(b)(1), unless the authorization is terminated or revoked sooner. Performed at [facility], [place] [address], [place], [place] [address]      Discharge Instructions:   Discharge Instructions    Call MD for:  difficulty breathing, headache or visual disturbances   Complete by: As directed    Call MD for:  persistant dizziness or light-headedness   Complete by: As directed    Call MD for:  temperature >100.4   Complete by: As directed    Diet - low sodium heart healthy   Complete by: As directed    Discharge instructions   Complete by: As directed    Cardiology will mail you event monitor. Follow instructions Follow up with cardiology 5 weeks Monitor BP daily at the same time of day. Keep a log of readings. Take data to cardiology follow up visit   Increase activity slowly   Complete by: As directed      Allergies as of 08/07/2019      Reactions   Oxycontin [oxycodone] Nausea Only   Gadolinium Derivatives Swelling, Rash   Patient reported having rash on  face and swelling of eyes the day after administration of MRI contrast on two occasions.        Medication List    TAKE these medications   clonazePAM 1 MG tablet Commonly known as: KLONOPIN Take 1 mg by mouth at bedtime.   traZODone 50 MG tablet Commonly known as: DESYREL Take 50-150 mg by mouth at bedtime as needed for sleep.   venlafaxine XR 75 MG 24 hr capsule Commonly known as: EFFEXOR-XR Take 75 mg by mouth daily with breakfast.         Time coordinating discharge: 40 minutes  Signed:  Radene Gunning NP  [facility] 08/07/2019, 3:08 PM

## 2019-08-07 NOTE — Progress Notes (Signed)
Patient had another episode of near-syncope that began while ambulating, continues to feel lightheaded and generally weak now at rest. Plan to repeat EKG now and review cardiac monitoring.

## 2019-08-07 NOTE — Progress Notes (Signed)
Progress Note    [name]  [id number] DOB: 1945-07-15  DOA: 08/06/2019 PCP: Merrilee Seashore, MD    Brief Narrative:    Medical records reviewed and are as summarized below:  [name] is a very pleasant 74 y.o. female with a past medical history significant for glaucoma, depression, chronic back pain status post multiple surgeries on her C-spine and lumbar spine admitted May 16 for near syncope/generalized weakness ongoingly/sinus bradycardia.  Cardiology consult has been requested  Assessment/Plan:   Principal Problem:   Near syncope Active Problems:   Sinus bradycardia   Weakness   Depression   Chronic back pain   #1.  Near syncope/generalized weakness/sinus bradycardia.  Patient presents with 1 month history of generalized weakness and fatigue.  Also episodes of acute weakness with diaphoresis.  Denies chest pain shortness of breath headache dizziness visual disturbances.  Does admit to intermittent lower extremity edema.  Occasion she "feels like I am going to pass out".  No history of CAD, CHF.  No signs of infection, no metabolic derangement, magnesium and TSH within the limits of normal.  Capillary blood sugar 79.  X-ray no acute findings.  Not orthostatic.  EKG does show sinus bradycardia.  Trace to 1+ pitting edema lower extremities. -Follow echo results -Request cardiology consult  #2.  Chronic pain.  Patient with a history of multiple surgeries on her C-spine and lumbar spine.  Current review indicates actively involved with physical therapy.  Reports pain level at baseline.  No pain medicine on home medication list -Monitor  #3.  Depression.  Stable at baseline -Continue home meds   Family Communication/Anticipated D/C date and plan/Code Status   DVT prophylaxis: Lovenox ordered. Code Status: Full Code.  Family Communication: patient at baseline Disposition Plan: Status is: Observation  The patient remains OBS appropriate and will d/c before  2 midnights.  Dispo: The patient is from: Home              Anticipated d/c is to: Home              Anticipated d/c date is: 1 day              Patient currently is not medically stable to d/c.         Medical Consultants:    cardiology   Anti-Infectives:    None  Subjective:   Awake alert.  Denies chest pain palpitations shortness of breath.  Did not get much sleep last night.  Objective:    Vitals:   08/07/19 0115 08/07/19 0253 08/07/19 0553 08/07/19 1216  BP:   125/64 (!) 123/56  Pulse: (!) 52  61 (!) 57  Resp: 14 16 17 16   Temp:  98.2 F (36.8 C) (!) 97.5 F (36.4 C) 98 F (36.7 C)  TempSrc:  Oral Oral Oral  SpO2: 100% 100% 99% 95%  Weight:  103.1 kg    Height:       No intake or output data in the 24 hours ending 08/07/19 1312 Filed Weights   08/06/19 1802 08/07/19 0253  Weight: 83.9 kg 103.1 kg    Exam: General: Awake alert no acute distress CV: Bradycardia regular no murmur gallop or rub trace to 1+ lower extremity pitting edema Respiratory: No increased work of breathing breath sounds are clear bilaterally I hear no crackles or wheezes Abdomen: Obese soft positive bowel sounds throughout no guarding or rebounding Musculoskeletal: Joints without swelling/erythema Neuro: Alert and oriented x3 speech clear  facial symmetry  Data Reviewed:   I have personally reviewed following labs and imaging studies:  Labs: Labs show the following:   Basic Metabolic Panel: Recent Labs  Lab 08/06/19 1648 08/07/19 0258  NA 142 141  K 4.2 3.8  CL 105 109  CO2 24 25  GLUCOSE 88 87  BUN 20 14  CREATININE 1.05* 0.88  CALCIUM 9.0 8.9  MG 2.1  --    GFR Estimated Creatinine Clearance: 72.8 mL/min (by C-G formula based on SCr of 0.88 mg/dL). Liver Function Tests: Recent Labs  Lab 08/06/19 1648  AST 21  ALT 18  ALKPHOS 75  BILITOT 0.6  PROT 5.9*  ALBUMIN 3.5   No results for input(s): LIPASE, AMYLASE in the last 168 hours. No results for  input(s): AMMONIA in the last 168 hours. Coagulation profile No results for input(s): INR, PROTIME in the last 168 hours.  CBC: Recent Labs  Lab 08/06/19 1648 08/07/19 0258  WBC 6.2 6.2  NEUTROABS 3.4  --   HGB 13.8 13.5  HCT 43.4 41.6  MCV 94.1 92.0  PLT 183 175   Cardiac Enzymes: No results for input(s): CKTOTAL, CKMB, CKMBINDEX, TROPONINI in the last 168 hours. BNP (last 3 results) No results for input(s): PROBNP in the last 8760 hours. CBG: Recent Labs  Lab 08/06/19 1347 08/07/19 0558  GLUCAP 75 79   D-Dimer: No results for input(s): DDIMER in the last 72 hours. Hgb A1c: No results for input(s): HGBA1C in the last 72 hours. Lipid Profile: No results for input(s): CHOL, HDL, LDLCALC, TRIG, CHOLHDL, LDLDIRECT in the last 72 hours. Thyroid function studies: Recent Labs    08/06/19 1758  TSH 1.837   Anemia work up: No results for input(s): VITAMINB12, FOLATE, FERRITIN, TIBC, IRON, RETICCTPCT in the last 72 hours. Sepsis Labs: Recent Labs  Lab 08/06/19 1648 08/07/19 0258  WBC 6.2 6.2    Microbiology Recent Results (from the past 240 hour(s))  Urine culture     Status: None (Preliminary result)   Collection Time: 08/06/19  4:45 PM   Specimen: Urine, Random  Result Value Ref Range Status   Specimen Description URINE, RANDOM  Final   Special Requests NONE  Final   Culture   Final    CULTURE REINCUBATED FOR BETTER GROWTH Performed at [facility], [address] [place], [place] [address]    Report Status PENDING  Incomplete  SARS Coronavirus 2 by RT PCR (hospital order, performed in [facility] hospital lab) Nasopharyngeal Nasopharyngeal Swab     Status: None   Collection Time: 08/06/19  9:21 PM   Specimen: Nasopharyngeal Swab  Result Value Ref Range Status   SARS Coronavirus 2 NEGATIVE NEGATIVE Final    Comment: (NOTE) SARS-CoV-2 target nucleic acids are NOT DETECTED. The SARS-CoV-2 RNA is generally detectable in upper and lower respiratory  specimens during the acute phase of infection. The lowest concentration of SARS-CoV-2 viral copies this assay can detect is 250 copies / mL. A negative result does not preclude SARS-CoV-2 infection and should not be used as the sole basis for treatment or other patient management decisions.  A negative result may occur with improper specimen collection / handling, submission of specimen other than nasopharyngeal swab, presence of viral mutation(s) within the areas targeted by this assay, and inadequate number of viral copies (<250 copies / mL). A negative result must be combined with clinical observations, patient history, and epidemiological information. Fact Sheet for Patients:   StrictlyIdeas.no Fact Sheet for  Healthcare Providers: BankingDealers.co.za This test is not yet approved or cleared  by the [place] and has been authorized for detection and/or diagnosis of SARS-CoV-2 by FDA under an Emergency Use Authorization (EUA).  This EUA will remain in effect (meaning this test can be used) for the duration of the COVID-19 declaration under Section 564(b)(1) of the Act, 21 U.S.C. section 360bbb-3(b)(1), unless the authorization is terminated or revoked sooner. Performed at [facility], [place] [address], [place], [place] [address]     Procedures and diagnostic studies:  X-ray chest PA and lateral  Result Date: 08/06/2019 CLINICAL DATA:  Near syncope. Fatigue for 2-3 weeks. EXAM: CHEST - 2 VIEW COMPARISON:  By radial 119 FINDINGS: The cardiomediastinal contours are normal. The lungs are clear. Pulmonary vasculature is normal. No consolidation, pleural effusion, or pneumothorax. No acute osseous abnormalities are seen. Surgical hardware in the lower cervical spine is partially included. IMPRESSION: No acute chest findings. Electronically Signed   By: Keith Rake M.D.   On: 08/06/2019 21:42    Medications:   .  clonazePAM  1 mg Oral QHS  . enoxaparin (LOVENOX) injection  40 mg Subcutaneous Q24H  . sodium chloride flush  3 mL Intravenous Q12H  . venlafaxine XR  75 mg Oral Q breakfast   Continuous Infusions:   LOS: 0 days   Radene Gunning NP  [facility]   How to contact the [facility] Attending or Consulting provider [place] or covering provider during after hours [place], for this patient?  1. Check the care team in [facility] and look for a) attending/consulting [facility] provider listed and b) the [facility] team listed 2. Log into [email or web address] and use [place]'s universal password to access. If you do not have the password, please contact the hospital operator. 3. Locate the [facility] provider you are looking for under [facility] and page to a number that you can be directly reached. 4. If you still have difficulty reaching the provider, please page the [facility] (Director on Call) for the Hospitalists listed on amion for assistance.  08/07/2019, 1:12 PM

## 2019-08-07 NOTE — Progress Notes (Signed)
I paged Dr. Myna Hidalgo, floor coverage , about possible plan with patient. Nurse from previous shift stated did not receive response from provider during her shift whether to proceed with discharge after symptoms. Dr. Myna Hidalgo stated will review patient and then update me on plan.

## 2019-08-08 ENCOUNTER — Observation Stay (HOSPITAL_COMMUNITY): Payer: Medicare Other

## 2019-08-08 ENCOUNTER — Ambulatory Visit: Payer: Medicare Other | Admitting: Physical Therapy

## 2019-08-08 ENCOUNTER — Inpatient Hospital Stay (HOSPITAL_COMMUNITY): Payer: Medicare Other

## 2019-08-08 DIAGNOSIS — K219 Gastro-esophageal reflux disease without esophagitis: Secondary | ICD-10-CM | POA: Diagnosis present

## 2019-08-08 DIAGNOSIS — Z87891 Personal history of nicotine dependence: Secondary | ICD-10-CM | POA: Diagnosis not present

## 2019-08-08 DIAGNOSIS — M1990 Unspecified osteoarthritis, unspecified site: Secondary | ICD-10-CM | POA: Diagnosis present

## 2019-08-08 DIAGNOSIS — K760 Fatty (change of) liver, not elsewhere classified: Secondary | ICD-10-CM | POA: Diagnosis not present

## 2019-08-08 DIAGNOSIS — F329 Major depressive disorder, single episode, unspecified: Secondary | ICD-10-CM | POA: Diagnosis present

## 2019-08-08 DIAGNOSIS — R5383 Other fatigue: Secondary | ICD-10-CM | POA: Diagnosis present

## 2019-08-08 DIAGNOSIS — R001 Bradycardia, unspecified: Secondary | ICD-10-CM | POA: Diagnosis not present

## 2019-08-08 DIAGNOSIS — Z79899 Other long term (current) drug therapy: Secondary | ICD-10-CM | POA: Diagnosis not present

## 2019-08-08 DIAGNOSIS — G8929 Other chronic pain: Secondary | ICD-10-CM | POA: Diagnosis present

## 2019-08-08 DIAGNOSIS — M549 Dorsalgia, unspecified: Secondary | ICD-10-CM | POA: Diagnosis present

## 2019-08-08 DIAGNOSIS — R55 Syncope and collapse: Secondary | ICD-10-CM | POA: Diagnosis not present

## 2019-08-08 DIAGNOSIS — R42 Dizziness and giddiness: Secondary | ICD-10-CM | POA: Diagnosis not present

## 2019-08-08 DIAGNOSIS — Z90710 Acquired absence of both cervix and uterus: Secondary | ICD-10-CM | POA: Diagnosis not present

## 2019-08-08 DIAGNOSIS — Z8249 Family history of ischemic heart disease and other diseases of the circulatory system: Secondary | ICD-10-CM | POA: Diagnosis not present

## 2019-08-08 DIAGNOSIS — R531 Weakness: Secondary | ICD-10-CM | POA: Diagnosis present

## 2019-08-08 DIAGNOSIS — Z20822 Contact with and (suspected) exposure to covid-19: Secondary | ICD-10-CM | POA: Diagnosis present

## 2019-08-08 LAB — TROPONIN I (HIGH SENSITIVITY)
Troponin I (High Sensitivity): 3 ng/L (ref <18)
Troponin I (High Sensitivity): 3 ng/L (ref <18)

## 2019-08-08 LAB — GLUCOSE, CAPILLARY
Glucose-Capillary: 86 mg/dL (ref 70–99)
Glucose-Capillary: 167 mg/dL — ABNORMAL HIGH (ref 70–99)
Glucose-Capillary: 88 mg/dL (ref 70–99)

## 2019-08-08 LAB — LIPASE, BLOOD: Lipase: 34 U/L (ref 11–51)

## 2019-08-08 MED ORDER — PANTOPRAZOLE SODIUM 40 MG PO TBEC
40.0000 mg | DELAYED_RELEASE_TABLET | Freq: Two times a day (BID) | ORAL | Status: DC
  Administered 2019-08-08 – 2019-08-09 (×2): 40 mg via ORAL
  Filled 2019-08-08 (×3): qty 1

## 2019-08-08 NOTE — Progress Notes (Signed)
Progress Note    [name]  [id number] DOB: [birth year]  DOA: 08/06/2019 PCP: Merrilee Seashore, MD    Brief Narrative:    Medical records reviewed and are as summarized below:  [name] is an 74 y.o. female with a past medical history significant for depression, arthritis admitted May 16 for near syncope/generalized weakness/sinus bradycardia. TSH within the limits of normal, echo unremarkable, home medications reviewed and not on any beta-blockers, no signs of infection no metabolic derangements. Evaluated by cardiology who recommended event monitor and okay for discharge on May 17. Patient experienced another episode and discharge was canceled. Assessment/Plan:   Principal Problem:   Near syncope Active Problems:   Sinus bradycardia   Weakness   Depression   Chronic back pain  #1.  Near syncope/generalized weakness/symptomatic bradycardia.  Patient presents with 1 month history of generalized weakness and fatigue.  Also episodes of acute weakness with diaphoresis.  Denies chest pain shortness of breath headache dizziness visual disturbances.  Does admit to intermittent lower extremity edema.  Occasion she "feels like I am going to pass out".  No history of CAD, CHF.  No signs of infection, no metabolic derangement, magnesium and TSH within the limits of normal.  Capillary blood sugar 79.  X-ray no acute findings.  Not orthostatic.  EKG does show sinus bradycardia.  Trace to 1+ pitting edema lower extremities. 2D echo unremarkable. Evaluated yesterday by cardiology who recommended event monitor with outpatient follow-up. Patient was discharged and prior to leaving experienced 2 more episodes of weakness diaphoresis. Discharge was canceled. EKG done with no acute changes. Her heart rate was 53 capillary blood sugar was 80 vital signs were stable. No events on telemetry during the night. This morning she feels even worse. Complained of started him "weakness". Evaluated by  cardiology who again opined symptoms not likely related to heart rate recommend event monitor and outpatient follow-up. Audiology has signed off. -We will obtain lipase -Ultrasound of right upper quadrant -Troponins -Start PPI -CT of the headt  #2.  Chronic pain.  Patient with a history of multiple surgeries on her C-spine and lumbar spine.  Current review indicates actively involved with physical therapy.  Reports pain level at baseline.  No pain medicine on home medication list -Monitor  #3.  Depression.  Stable at baseline -Continue home meds    Family Communication/Anticipated D/C date and plan/Code Status   DVT prophylaxis: Lovenox ordered. Code Status: Full Code.  Family Communication: patient Disposition Plan: Status is: Inpatient  Remains inpatient appropriate because:Ongoing diagnostic testing needed not appropriate for outpatient work up and Inpatient level of care appropriate due to severity of illness   Dispo: The patient is from: Home              Anticipated d/c is to: Home              Anticipated d/c date is: 1 day              Patient currently is not medically stable to d/c.      Medical Consultants:   [facility] cardiology  Anti-Infectives:    None  Subjective:   Lying in bed eyes closed. Arouses to verbal stimuli. States she feels "okay" when lying down  Objective:    Vitals:   08/07/19 2334 08/08/19 0501 08/08/19 0840 08/08/19 0947  BP: (!) 143/67 (!) 111/46 (!) 102/54 (!) 106/54  Pulse: (!) 59 (!) 53 (!) 53 (!) 56  Resp: 16 16  17 17  Temp: 98.4 F (36.9 C) 97.9 F (36.6 C) 97.8 F (36.6 C) 97.9 F (36.6 C)  TempSrc: Oral Oral Oral Oral  SpO2:  96% 98% 99%  Weight:      Height:       No intake or output data in the 24 hours ending 08/08/19 1258 Filed Weights   08/06/19 1802 08/07/19 0253  Weight: 83.9 kg 103.1 kg    Exam: General: Awake alert no acute distress CV: Bradycardia regular no murmur gallop or rub trace to 1+ lower  extremity pitting edema Respiratory: No increased work of breathing breath sounds are clear bilaterally I hear no crackles or wheezes Abdomen: Obese soft positive bowel sounds throughout no guarding or rebounding Musculoskeletal: Joints without swelling/erythema Neuro: Alert and oriented x3 speech clear facial symmetry   Data Reviewed:   I have personally reviewed following labs and imaging studies:  Labs: Labs show the following:   Basic Metabolic Panel: Recent Labs  Lab 08/06/19 1648 08/07/19 0258  NA 142 141  K 4.2 3.8  CL 105 109  CO2 24 25  GLUCOSE 88 87  BUN 20 14  CREATININE 1.05* 0.88  CALCIUM 9.0 8.9  MG 2.1  --    GFR Estimated Creatinine Clearance: 72.8 mL/min (by C-G formula based on SCr of 0.88 mg/dL). Liver Function Tests: Recent Labs  Lab 08/06/19 1648  AST 21  ALT 18  ALKPHOS 75  BILITOT 0.6  PROT 5.9*  ALBUMIN 3.5   Recent Labs  Lab 08/08/19 1113  LIPASE 34   No results for input(s): AMMONIA in the last 168 hours. Coagulation profile No results for input(s): INR, PROTIME in the last 168 hours.  CBC: Recent Labs  Lab 08/06/19 1648 08/07/19 0258  WBC 6.2 6.2  NEUTROABS 3.4  --   HGB 13.8 13.5  HCT 43.4 41.6  MCV 94.1 92.0  PLT 183 175   Cardiac Enzymes: No results for input(s): CKTOTAL, CKMB, CKMBINDEX, TROPONINI in the last 168 hours. BNP (last 3 results) No results for input(s): PROBNP in the last 8760 hours. CBG: Recent Labs  Lab 08/06/19 1347 08/07/19 0558 08/07/19 1611 08/08/19 0639 08/08/19 1130  GLUCAP 75 79 80 86 167*   D-Dimer: No results for input(s): DDIMER in the last 72 hours. Hgb A1c: No results for input(s): HGBA1C in the last 72 hours. Lipid Profile: No results for input(s): CHOL, HDL, LDLCALC, TRIG, CHOLHDL, LDLDIRECT in the last 72 hours. Thyroid function studies: Recent Labs    08/06/19 1758  TSH 1.837   Anemia work up: No results for input(s): VITAMINB12, FOLATE, FERRITIN, TIBC, IRON,  RETICCTPCT in the last 72 hours. Sepsis Labs: Recent Labs  Lab 08/06/19 1648 08/07/19 0258  WBC 6.2 6.2    Microbiology Recent Results (from the past 240 hour(s))  Urine culture     Status: Abnormal   Collection Time: 08/06/19  4:45 PM   Specimen: Urine, Random  Result Value Ref Range Status   Specimen Description URINE, RANDOM  Final   Special Requests NONE  Final   Culture (A)  Final    80,000 COLONIES/mL GROUP B STREP(S.AGALACTIAE)ISOLATED TESTING AGAINST S. AGALACTIAE NOT ROUTINELY PERFORMED DUE TO PREDICTABILITY OF AMP/PEN/VAN SUSCEPTIBILITY. Performed at [facility], [place] [address], [place], [place] [address]    Report Status 08/07/2019 FINAL  Final  SARS Coronavirus 2 by RT PCR (hospital order, performed in [facility] hospital lab) Nasopharyngeal Nasopharyngeal Swab     Status: None   Collection  Time: 08/06/19  9:21 PM   Specimen: Nasopharyngeal Swab  Result Value Ref Range Status   SARS Coronavirus 2 NEGATIVE NEGATIVE Final    Comment: (NOTE) SARS-CoV-2 target nucleic acids are NOT DETECTED. The SARS-CoV-2 RNA is generally detectable in upper and lower respiratory specimens during the acute phase of infection. The lowest concentration of SARS-CoV-2 viral copies this assay can detect is 250 copies / mL. A negative result does not preclude SARS-CoV-2 infection and should not be used as the sole basis for treatment or other patient management decisions.  A negative result may occur with improper specimen collection / handling, submission of specimen other than nasopharyngeal swab, presence of viral mutation(s) within the areas targeted by this assay, and inadequate number of viral copies (<250 copies / mL). A negative result must be combined with clinical observations, patient history, and epidemiological information. Fact Sheet for Patients:   StrictlyIdeas.no Fact Sheet for Healthcare  Providers: BankingDealers.co.za This test is not yet approved or cleared  by the [place] FDA and has been authorized for detection and/or diagnosis of SARS-CoV-2 by FDA under an Emergency Use Authorization (EUA).  This EUA will remain in effect (meaning this test can be used) for the duration of the COVID-19 declaration under Section 564(b)(1) of the Act, 21 U.S.C. section 360bbb-3(b)(1), unless the authorization is terminated or revoked sooner. Performed at [facility], [place] [address], [place], [place] [address]     Procedures and diagnostic studies:  X-ray chest PA and lateral  Result Date: 08/06/2019 CLINICAL DATA:  Near syncope. Fatigue for 2-3 weeks. EXAM: CHEST - 2 VIEW COMPARISON:  By radial 119 FINDINGS: The cardiomediastinal contours are normal. The lungs are clear. Pulmonary vasculature is normal. No consolidation, pleural effusion, or pneumothorax. No acute osseous abnormalities are seen. Surgical hardware in the lower cervical spine is partially included. IMPRESSION: No acute chest findings. Electronically Signed   By: Keith Rake M.D.   On: 08/06/2019 21:42   CT HEAD WO CONTRAST  Result Date: 08/08/2019 CLINICAL DATA:  Nonspecific dizziness.  Weakness. EXAM: CT HEAD WITHOUT CONTRAST TECHNIQUE: Contiguous axial images were obtained from the base of the skull through the vertex without intravenous contrast. COMPARISON:  MRI 11/12/2012 FINDINGS: Brain: Age related volume loss. No evidence of old or acute small or large vessel infarction, mass lesion, hemorrhage, hydrocephalus or extra-axial collection. Vascular: There is atherosclerotic calcification of the major vessels at the base of the brain. Skull: Negative Sinuses/Orbits: No active sinus inflammatory disease left ethmoid. Incidental sinus osteoma. No fluid in the middle ears or mastoids. Orbits negative. Other: None IMPRESSION: No acute or significant finding. No cause of the presenting  symptoms is identified. The brain appears normal, with mild age related volume loss. Electronically Signed   By: Nelson Chimes M.D.   On: 08/08/2019 09:17   ECHOCARDIOGRAM COMPLETE  Result Date: 08/07/2019    ECHOCARDIOGRAM REPORT   Patient Name:   DANIE HODSON Date of Exam: 08/07/2019 Medical Rec #:  [id number]     Height:       69.0 in Accession #:    [id number]    Weight:       227.3 lb Date of Birth:  November 14, 1945     BSA:          2.181 m Patient Age:    30 years      BP:           125/64 mmHg Patient Gender: F  HR:           61 bpm. Exam Location:  Inpatient Procedure: 2D Echo, Cardiac Doppler, Color Doppler and Strain Analysis Indications:    Syncope 780.2 / R55                 Abnormal ECG 794.31 / R94.31  History:        Patient has no prior history of Echocardiogram examinations. 1                 month history of generalized weakness and fatigue. Sinus                 bradycardia.  Sonographer:    Darlina Sicilian RDCS Referring Phys: [place]  1. Left ventricular ejection fraction, by estimation, is 65 to 70%. The left ventricle has normal function. The left ventricle has no regional wall motion abnormalities. Left ventricular diastolic parameters are consistent with age-related delayed relaxation (normal).  2. Right ventricular systolic function is normal. The right ventricular size is normal.  3. The mitral valve is normal in structure. Trivial mitral valve regurgitation. No evidence of mitral stenosis.  4. The aortic valve is tricuspid. Aortic valve regurgitation is not visualized. Mild aortic valve sclerosis is present, with no evidence of aortic valve stenosis.  5. The inferior vena cava is normal in size with greater than 50% respiratory variability, suggesting right atrial pressure of 3 mmHg. Comparison(s): No prior Echocardiogram. Conclusion(s)/Recommendation(s): Normal biventricular function without evidence of hemodynamically significant valvular heart disease.  FINDINGS  Left Ventricle: Left ventricular ejection fraction, by estimation, is 65 to 70%. The left ventricle has normal function. The left ventricle has no regional wall motion abnormalities. The left ventricular internal cavity size was normal in size. There is  no left ventricular hypertrophy. Left ventricular diastolic parameters are consistent with age-related delayed relaxation (normal). Right Ventricle: The right ventricular size is normal. No increase in right ventricular wall thickness. Right ventricular systolic function is normal. Left Atrium: Left atrial size was normal in size. Right Atrium: Right atrial size was normal in size. Pericardium: Trivial pericardial effusion is present. Mitral Valve: The mitral valve is normal in structure. Trivial mitral valve regurgitation. No evidence of mitral valve stenosis. Tricuspid Valve: The tricuspid valve is normal in structure. Tricuspid valve regurgitation is trivial. No evidence of tricuspid stenosis. Aortic Valve: The aortic valve is tricuspid. Aortic valve regurgitation is not visualized. Mild aortic valve sclerosis is present, with no evidence of aortic valve stenosis. There is mild calcification of the aortic valve. Pulmonic Valve: The pulmonic valve was not well visualized. Pulmonic valve regurgitation is not visualized. No evidence of pulmonic stenosis. Aorta: The aortic root and ascending aorta are structurally normal, with no evidence of dilitation. Venous: The inferior vena cava is normal in size with greater than 50% respiratory variability, suggesting right atrial pressure of 3 mmHg. IAS/Shunts: No atrial level shunt detected by color flow Doppler.  LEFT VENTRICLE PLAX 2D LVIDd:         5.11 cm  Diastology LVIDs:         3.98 cm  LV e' lateral:   9.20 cm/s LV PW:         0.82 cm  LV E/e' lateral: 11.0 LV IVS:        0.84 cm  LV e' medial:    7.67 cm/s LVOT diam:     1.90 cm  LV E/e' medial:  13.2 LV SV:  50 LV SV Index:   23       2D  Longitudinal Strain LVOT Area:     2.84 cm 2D Strain GLS (A2C):   -22.3 %                         2D Strain GLS (A3C):   -26.4 %                         2D Strain GLS (A4C):   -21.8 %                         2D Strain GLS Avg:     -23.4 % RIGHT VENTRICLE RV S prime:     11.60 cm/s TAPSE (M-mode): 1.8 cm LEFT ATRIUM             Index       RIGHT ATRIUM           Index LA diam:        4.00 cm 1.83 cm/m  RA Area:     15.80 cm LA Vol (A2C):   39.5 ml 18.11 ml/m RA Volume:   37.00 ml  16.96 ml/m LA Vol (A4C):   55.9 ml 25.63 ml/m LA Biplane Vol: 47.2 ml 21.64 ml/m  AORTIC VALVE LVOT Vmax:   86.20 cm/s LVOT Vmean:  54.900 cm/s LVOT VTI:    0.176 m  AORTA Ao Root diam: 3.10 cm MITRAL VALVE MV Area (PHT): 3.72 cm     SHUNTS MV Decel Time: 204 msec     Systemic VTI:  0.18 m MV E velocity: 101.00 cm/s  Systemic Diam: 1.90 cm MV A velocity: 83.10 cm/s MV E/A ratio:  1.22 Buford Dresser MD Electronically signed by Buford Dresser MD Signature Date/Time: 08/07/2019/2:24:07 PM    Final     Medications:   . clonazePAM  1 mg Oral QHS  . enoxaparin (LOVENOX) injection  40 mg Subcutaneous Q24H  . pantoprazole  40 mg Oral BID  . sodium chloride flush  3 mL Intravenous Q12H  . venlafaxine XR  75 mg Oral Q breakfast   Continuous Infusions:   LOS: 0 days   Radene Gunning NP [facility]   How to contact the [facility] Attending or Consulting provider [place] or covering provider during after hours [place], for this patient?  1. Check the care team in [facility] and look for a) attending/consulting [facility] provider listed and b) the [facility] team listed 2. Log into [email or web address] and use [place]'s universal password to access. If you do not have the password, please contact the hospital operator. 3. Locate the [facility] provider you are looking for under [facility] and page to a number that you can be directly reached. 4. If you still have difficulty reaching the provider, please page the [facility] (Director on  Call) for the Hospitalists listed on amion for assistance.  08/08/2019, 12:58 PM

## 2019-08-08 NOTE — Progress Notes (Addendum)
Progress Note  Patient Name: [name] Date of Encounter: 08/08/2019  Primary Cardiologist:  Mertie Moores, MD  Subjective   74 y.o. female with a hx of glaucoma, degenerative disc disease and degenerative joint disease, depression, sinus bradycardia, was admitted 05/17 w/ presyncope.  05/19: Pt had an episode last pm w/ ambulation, just like all the others. Per nursing notes, pt HR 53 at the time, CBG 80, BP not mentioned.  Today she feels well but has not been OOB to ambulate.   Inpatient Medications    Scheduled Meds: . clonazePAM  1 mg Oral QHS  . enoxaparin (LOVENOX) injection  40 mg Subcutaneous Q24H  . sodium chloride flush  3 mL Intravenous Q12H  . venlafaxine XR  75 mg Oral Q breakfast   Continuous Infusions:  PRN Meds: acetaminophen **OR** acetaminophen, ondansetron **OR** ondansetron (ZOFRAN) IV, traZODone   Vital Signs    Vitals:   08/07/19 2334 08/08/19 0501 08/08/19 0840 08/08/19 0947  BP: (!) 143/67 (!) 111/46 (!) 102/54 (!) 106/54  Pulse: (!) 59 (!) 53 (!) 53 (!) 56  Resp: 16 16 17 17   Temp: 98.4 F (36.9 C) 97.9 F (36.6 C) 97.8 F (36.6 C) 97.9 F (36.6 C)  TempSrc: Oral Oral Oral Oral  SpO2:  96% 98% 99%  Weight:      Height:       No intake or output data in the 24 hours ending 08/08/19 1040 Filed Weights   08/06/19 1802 08/07/19 0253  Weight: 83.9 kg 103.1 kg   Last Weight  Most recent update: 08/07/2019  3:20 AM   Weight  103.1 kg (227 lb 4.7 oz)           Weight change:    Telemetry    SR, Sbrady w/ PACs, HR sustained low 50s - Personally Reviewed  ECG    None today - Personally Reviewed  Physical Exam   General: Well developed, well nourished, female appearing in no acute distress. Head: Normocephalic, atraumatic.  Neck: Supple without bruits, JVD not elevated. Lungs:  Resp regular and unlabored, CTA. Heart: RRR, S1, S2, no S3, S4, or murmur; no rub. Abdomen: Soft, non-tender, non-distended with normoactive  bowel sounds. No hepatomegaly. No rebound/guarding. No obvious abdominal masses. Extremities: No clubbing, cyanosis, no edema. Distal pedal pulses are 2+ bilaterally. Neuro: Alert and oriented X 3. Moves all extremities spontaneously. Psych: Normal affect.  Labs    Hematology Recent Labs  Lab 08/06/19 1648 08/07/19 0258  WBC 6.2 6.2  RBC 4.61 4.52  HGB 13.8 13.5  HCT 43.4 41.6  MCV 94.1 92.0  MCH 29.9 29.9  MCHC 31.8 32.5  RDW 16.5* 16.5*  PLT 183 175    Chemistry Recent Labs  Lab 08/06/19 1648 08/07/19 0258  NA 142 141  K 4.2 3.8  CL 105 109  CO2 24 25  GLUCOSE 88 87  BUN 20 14  CREATININE 1.05* 0.88  CALCIUM 9.0 8.9  PROT 5.9*  --   ALBUMIN 3.5  --   AST 21  --   ALT 18  --   ALKPHOS 75  --   BILITOT 0.6  --   GFRNONAA 53* >60  GFRAA >60 >60  ANIONGAP 13 7     High Sensitivity Troponin:  No results for input(s): TROPONINIHS in the last 720 hours.    BNPNo results for input(s): BNP, PROBNP in the last 168 hours.   DDimer No results for input(s): DDIMER in the last 168  hours.   Lab Results  Component Value Date   TSH 1.837 08/06/2019     Radiology    X-ray chest PA and lateral  Result Date: 08/06/2019 CLINICAL DATA:  Near syncope. Fatigue for 2-3 weeks. EXAM: CHEST - 2 VIEW COMPARISON:  By radial 119 FINDINGS: The cardiomediastinal contours are normal. The lungs are clear. Pulmonary vasculature is normal. No consolidation, pleural effusion, or pneumothorax. No acute osseous abnormalities are seen. Surgical hardware in the lower cervical spine is partially included. IMPRESSION: No acute chest findings. Electronically Signed   By: Keith Rake M.D.   On: 08/06/2019 21:42   CT HEAD WO CONTRAST  Result Date: 08/08/2019 CLINICAL DATA:  Nonspecific dizziness.  Weakness. EXAM: CT HEAD WITHOUT CONTRAST TECHNIQUE: Contiguous axial images were obtained from the base of the skull through the vertex without intravenous contrast. COMPARISON:  MRI 11/12/2012  FINDINGS: Brain: Age related volume loss. No evidence of old or acute small or large vessel infarction, mass lesion, hemorrhage, hydrocephalus or extra-axial collection. Vascular: There is atherosclerotic calcification of the major vessels at the base of the brain. Skull: Negative Sinuses/Orbits: No active sinus inflammatory disease left ethmoid. Incidental sinus osteoma. No fluid in the middle ears or mastoids. Orbits negative. Other: None IMPRESSION: No acute or significant finding. No cause of the presenting symptoms is identified. The brain appears normal, with mild age related volume loss. Electronically Signed   By: Nelson Chimes M.D.   On: 08/08/2019 09:17   ECHOCARDIOGRAM COMPLETE  Result Date: 08/07/2019    ECHOCARDIOGRAM REPORT   Patient Name:   [name] Date of Exam: 08/07/2019 Medical Rec #:  [id number]     Height:       69.0 in Accession #:    [id number]    Weight:       227.3 lb Date of Birth:  Aug 14, 1945     BSA:          2.181 m Patient Age:    9 years      BP:           125/64 mmHg Patient Gender: F             HR:           61 bpm. Exam Location:  Inpatient Procedure: 2D Echo, Cardiac Doppler, Color Doppler and Strain Analysis Indications:    Syncope 780.2 / R55                 Abnormal ECG 794.31 / R94.31  History:        Patient has no prior history of Echocardiogram examinations. 1                 month history of generalized weakness and fatigue. Sinus                 bradycardia.  Sonographer:    Darlina Sicilian RDCS Referring Phys: [place]  1. Left ventricular ejection fraction, by estimation, is 65 to 70%. The left ventricle has normal function. The left ventricle has no regional wall motion abnormalities. Left ventricular diastolic parameters are consistent with age-related delayed relaxation (normal).  2. Right ventricular systolic function is normal. The right ventricular size is normal.  3. The mitral valve is normal in structure. Trivial mitral valve  regurgitation. No evidence of mitral stenosis.  4. The aortic valve is tricuspid. Aortic valve regurgitation is not visualized. Mild aortic valve sclerosis is present, with no evidence of aortic  valve stenosis.  5. The inferior vena cava is normal in size with greater than 50% respiratory variability, suggesting right atrial pressure of 3 mmHg. Comparison(s): No prior Echocardiogram. Conclusion(s)/Recommendation(s): Normal biventricular function without evidence of hemodynamically significant valvular heart disease. FINDINGS  Left Ventricle: Left ventricular ejection fraction, by estimation, is 65 to 70%. The left ventricle has normal function. The left ventricle has no regional wall motion abnormalities. The left ventricular internal cavity size was normal in size. There is  no left ventricular hypertrophy. Left ventricular diastolic parameters are consistent with age-related delayed relaxation (normal). Right Ventricle: The right ventricular size is normal. No increase in right ventricular wall thickness. Right ventricular systolic function is normal. Left Atrium: Left atrial size was normal in size. Right Atrium: Right atrial size was normal in size. Pericardium: Trivial pericardial effusion is present. Mitral Valve: The mitral valve is normal in structure. Trivial mitral valve regurgitation. No evidence of mitral valve stenosis. Tricuspid Valve: The tricuspid valve is normal in structure. Tricuspid valve regurgitation is trivial. No evidence of tricuspid stenosis. Aortic Valve: The aortic valve is tricuspid. Aortic valve regurgitation is not visualized. Mild aortic valve sclerosis is present, with no evidence of aortic valve stenosis. There is mild calcification of the aortic valve. Pulmonic Valve: The pulmonic valve was not well visualized. Pulmonic valve regurgitation is not visualized. No evidence of pulmonic stenosis. Aorta: The aortic root and ascending aorta are structurally normal, with no evidence of  dilitation. Venous: The inferior vena cava is normal in size with greater than 50% respiratory variability, suggesting right atrial pressure of 3 mmHg. IAS/Shunts: No atrial level shunt detected by color flow Doppler.  LEFT VENTRICLE PLAX 2D LVIDd:         5.11 cm  Diastology LVIDs:         3.98 cm  LV e' lateral:   9.20 cm/s LV PW:         0.82 cm  LV E/e' lateral: 11.0 LV IVS:        0.84 cm  LV e' medial:    7.67 cm/s LVOT diam:     1.90 cm  LV E/e' medial:  13.2 LV SV:         50 LV SV Index:   23       2D Longitudinal Strain LVOT Area:     2.84 cm 2D Strain GLS (A2C):   -22.3 %                         2D Strain GLS (A3C):   -26.4 %                         2D Strain GLS (A4C):   -21.8 %                         2D Strain GLS Avg:     -23.4 % RIGHT VENTRICLE RV S prime:     11.60 cm/s TAPSE (M-mode): 1.8 cm LEFT ATRIUM             Index       RIGHT ATRIUM           Index LA diam:        4.00 cm 1.83 cm/m  RA Area:     15.80 cm LA Vol (A2C):   39.5 ml 18.11 ml/m RA Volume:   37.00 ml  16.96 ml/m  LA Vol (A4C):   55.9 ml 25.63 ml/m LA Biplane Vol: 47.2 ml 21.64 ml/m  AORTIC VALVE LVOT Vmax:   86.20 cm/s LVOT Vmean:  54.900 cm/s LVOT VTI:    0.176 m  AORTA Ao Root diam: 3.10 cm MITRAL VALVE MV Area (PHT): 3.72 cm     SHUNTS MV Decel Time: 204 msec     Systemic VTI:  0.18 m MV E velocity: 101.00 cm/s  Systemic Diam: 1.90 cm MV A velocity: 83.10 cm/s MV E/A ratio:  1.22 Buford Dresser MD Electronically signed by Buford Dresser MD Signature Date/Time: 08/07/2019/2:24:07 PM    Final      Cardiac Studies   ECHO:  08/07/2019 1. Left ventricular ejection fraction, by estimation, is 65 to 70%. The  left ventricle has normal function. The left ventricle has no regional  wall motion abnormalities. Left ventricular diastolic parameters are  consistent with age-related delayed  relaxation (normal).  2. Right ventricular systolic function is normal. The right ventricular  size is normal.  3.  The mitral valve is normal in structure. Trivial mitral valve  regurgitation. No evidence of mitral stenosis.  4. The aortic valve is tricuspid. Aortic valve regurgitation is not  visualized. Mild aortic valve sclerosis is present, with no evidence of  aortic valve stenosis.  5. The inferior vena cava is normal in size with greater than 50%  respiratory variability, suggesting right atrial pressure of 3 mmHg.   Patient Profile     73 y.o. female w/ hx  with a hx of glaucoma, degenerative disc disease and degenerative joint disease, depression, sinus bradycardia, was admitted 05/17 w/ presyncope.  Assessment & Plan    1. Near syncope - episode 05/17 while ambulating, says most of them occur w/ activity - HR 50s during this, raising concerns for chronotropic incomptetence - staff to ambulate pt and ck HR w/ activity - no significant abnormality on echo - if no problems w/ ambulation, no further inpatient workup  Otherwise, per IM Principal Problem:   Near syncope Active Problems:   Weakness   Sinus bradycardia   Chronic back pain   Depression    Signed, Rosaria Ferries , PA-C 10:40 AM 08/08/2019 Pager: [phone]   Attending Note:   The patient was seen and examined.  Agree with assessment and plan as noted above.  Changes made to the above note as needed.  Patient seen and independently examined with  Rosaria Ferries, PA .   We discussed all aspects of the encounter. I agree with the assessment and plan as stated above.  1 presyncope/dizziness: I doubt that her symptoms are due to heart rate of 50.  Yesterday she ambulated in the hall and her heart rate increased into the 80-90 range with ambulation.  I suspect that her chronotropic response is completely intact.  She is feeling better today.  I think that she will be able to be discharged today.  We will follow up with her in the office.  We will place an event monitor looking for long pauses.  She has had chronic  sinus bradycardia and her slow heart rate does not seem to produce symptoms all the time.  She may have a vertebral or basilar cause for her instability.  We will leave further issues up to her primary medical doctor and the [facility] hospitalist team.  [facility] will sign off.   Medication Recommendations:  Cont current meds.  Other recommendations (labs, testing, etc):   Follow up as an outpatient:  With Dr. Acie Fredrickson ,  She will have an event monitor mailed out to her     I have spent a total of 40 minutes with patient reviewing hospital  notes , telemetry, EKGs, labs and examining patient as well as establishing an assessment and plan that was discussed with the patient. > 50% of time was spent in direct patient care.    Thayer Headings, Brooke Bonito., MD, [facility] 08/08/2019, 12:26 PM [address]  [place] Pager [phone]

## 2019-08-09 ENCOUNTER — Other Ambulatory Visit: Payer: Self-pay | Admitting: Cardiology

## 2019-08-09 LAB — BASIC METABOLIC PANEL
Anion gap: 9 (ref 5–15)
BUN: 13 mg/dL (ref 8–23)
CO2: 25 mmol/L (ref 22–32)
Calcium: 8.8 mg/dL — ABNORMAL LOW (ref 8.9–10.3)
Chloride: 107 mmol/L (ref 98–111)
Creatinine, Ser: 1 mg/dL (ref 0.44–1.00)
GFR calc Af Amer: >60 mL/min (ref >60)
GFR calc non Af Amer: 56 mL/min — ABNORMAL LOW (ref >60)
Glucose, Bld: 103 mg/dL — ABNORMAL HIGH (ref 70–99)
Potassium: 4.4 mmol/L (ref 3.5–5.1)
Sodium: 141 mmol/L (ref 135–145)

## 2019-08-09 LAB — GLUCOSE, CAPILLARY
Glucose-Capillary: 87 mg/dL (ref 70–99)
Glucose-Capillary: 97 mg/dL (ref 70–99)

## 2019-08-09 LAB — CBC
HCT: 41 % (ref 36.0–46.0)
Hemoglobin: 13.2 g/dL (ref 12.0–15.0)
MCH: 30 pg (ref 26.0–34.0)
MCHC: 32.2 g/dL (ref 30.0–36.0)
MCV: 93.2 fL (ref 80.0–100.0)
Platelets: 183 10*3/uL (ref 150–400)
RBC: 4.4 MIL/uL (ref 3.87–5.11)
RDW: 16.6 % — ABNORMAL HIGH (ref 11.5–15.5)
WBC: 6.2 10*3/uL (ref 4.0–10.5)
nRBC: 0 % (ref 0.0–0.2)

## 2019-08-09 MED ORDER — PANTOPRAZOLE SODIUM 40 MG PO TBEC: 40.0000 mg | DELAYED_RELEASE_TABLET | Freq: Two times a day (BID) | ORAL | 0 refills | Status: AC

## 2019-08-09 NOTE — Discharge Summary (Signed)
Physician Discharge Summary  KAMILI NEVILS [id number] DOB: [birth year] DOA: 08/06/2019  PCP: Merrilee Seashore, MD  Admit date: 08/06/2019 Discharge date: 08/09/2019  Admitted From: home Discharge disposition: home   Recommendations for Outpatient Follow-Up:   1. Cardiology will mail event monitor to patient 2. Follow up with cardiology in 4-5 weeks for evaluation of event monitor data 3. Follow up with PCP 1-2 weeks for evaluation of symptoms and effectiveness of PPI. Consider GI consult if no improvement   Discharge Diagnosis:   Principal Problem:   Near syncope Active Problems:   Sinus bradycardia   Weakness   Depression   Chronic back pain    Discharge Condition: Improved.  Diet recommendation: Low sodium, heart healthy.  Carbohydrate-modified.    Wound care: None.  Code status: Full.   History of Present Illness:   [name] a 74 y.o.femalewith medical history significant ofdepression, arthritis. Pt presented to the ED with ~1 month history of generalized weakness and fatigue. Initially having weakness with diaphoresis. Episodes last about 5 mins. Sometimes feeling like she will pass out. No actual LOC. No CP, SOB, abd pain, N/V/D.   Hospital Course by Problem:   #1. Near syncope/generalized weakness/sinus bradycardia. Patient presented with 1 month history of generalized weakness and fatigue. Also episodes of acute weakness with diaphoresis. Denied chest pain shortness of breath headache dizziness visual disturbances. Does admit to intermittent lower extremity edema. Occasion she "feels like I am going to pass out". No history of CAD, CHF. No signs of infection, no metabolic derangement, magnesium and TSH within the limits of normal. Capillary blood sugar 79.X-ray no acute findings. Not orthostatic. EKG does show sinus bradycardia. Trace to 1+ pitting edema lower extremities.echo with normal EF. Evaluated by cardiology who  opine symptoms atypical to be related to bradycardia and never actually had true syncope. She ambulated in hall and HR upt to 60-80 range. Cards recommended Zio patch monitor.  Patient then had another episode that included epigastric pain associated with diaphoresis.  She appeared uncomfortable.  Lipase troponin LFTs within the limits of normal.  CT of the head unremarkable.  Ultrasound of the right upper quadrant no evidence of cholelithiasis or biliary ductal dilation, mild diffuse hepatic steatosis.  She was provided Protonix for possible GERD. Urine culture did grow GBS she is completely asymptomatic did not treat.  On day of discharge she indicated her "episodes" were fewer and farther between.  She was reevaluated by cardiology who once again opined not likely cardiac etiology.  #2. Chronic pain. Patient with a history of multiple surgeries on her C-spine and lumbar spine. Current review indicates actively involved with physical therapy. Reports pain level at baseline.No pain medicine on home medication list  #3. Depression. Stable at baseline   Medical Consultants:   [facility] cardiology   Discharge Exam:   Vitals:   08/09/19 0522 08/09/19 0954  BP: (!) 103/56 110/65  Pulse: (!) 58 60  Resp: 16 16  Temp: 98.4 F (36.9 C) 97.9 F (36.6 C)  SpO2: 98% 96%   Vitals:   08/08/19 2014 08/08/19 2352 08/09/19 0522 08/09/19 0954  BP: (!) 113/42 (!) 100/58 (!) 103/56 110/65  Pulse: 60 (!) 57 (!) 58 60  Resp: 17 16 16 16   Temp: 98.4 F (36.9 C) 98 F (36.7 C) 98.4 F (36.9 C) 97.9 F (36.6 C)  TempSrc: Oral Oral Oral Oral  SpO2:  98% 98% 96%  Weight:      Height:  General exam: Appears calm and comfortable. No acute distress Respiratory system: Clear to auscultation. Respiratory effort normal. Cardiovascular system: S1 & S2 heard, RRR. No JVD,  rubs, gallops or clicks. No murmurs. Gastrointestinal system: Abdomen is nondistended, soft and nontender. No organomegaly  or masses felt. Normal bowel sounds heard. Central nervous system: Alert and oriented. No focal neurological deficits. Extremities: No clubbing,  or cyanosis. No edema. Skin: No rashes, lesions or ulcers. Psychiatry: Judgement and insight appear normal. Mood & affect appropriate.    The results of significant diagnostics from this hospitalization (including imaging, microbiology, ancillary and laboratory) are listed below for reference.     Procedures and Diagnostic Studies:   X-ray chest PA and lateral  Result Date: 08/06/2019 CLINICAL DATA:  Near syncope. Fatigue for 2-3 weeks. EXAM: CHEST - 2 VIEW COMPARISON:  By radial 119 FINDINGS: The cardiomediastinal contours are normal. The lungs are clear. Pulmonary vasculature is normal. No consolidation, pleural effusion, or pneumothorax. No acute osseous abnormalities are seen. Surgical hardware in the lower cervical spine is partially included. IMPRESSION: No acute chest findings. Electronically Signed   By: Keith Rake M.D.   On: 08/06/2019 21:42   ECHOCARDIOGRAM COMPLETE  Result Date: 08/07/2019    ECHOCARDIOGRAM REPORT   Patient Name:   [name] Date of Exam: 08/07/2019 Medical Rec #:  [id number]     Height:       69.0 in Accession #:    [id number]    Weight:       227.3 lb Date of Birth:  21-Jan-1946     BSA:          2.181 m Patient Age:    35 years      BP:           125/64 mmHg Patient Gender: F             HR:           61 bpm. Exam Location:  Inpatient Procedure: 2D Echo, Cardiac Doppler, Color Doppler and Strain Analysis Indications:    Syncope 780.2 / R55                 Abnormal ECG 794.31 / R94.31  History:        Patient has no prior history of Echocardiogram examinations. 1                 month history of generalized weakness and fatigue. Sinus                 bradycardia.  Sonographer:    Darlina Sicilian RDCS Referring Phys: [place]  1. Left ventricular ejection fraction, by estimation, is 65 to 70%. The  left ventricle has normal function. The left ventricle has no regional wall motion abnormalities. Left ventricular diastolic parameters are consistent with age-related delayed relaxation (normal).  2. Right ventricular systolic function is normal. The right ventricular size is normal.  3. The mitral valve is normal in structure. Trivial mitral valve regurgitation. No evidence of mitral stenosis.  4. The aortic valve is tricuspid. Aortic valve regurgitation is not visualized. Mild aortic valve sclerosis is present, with no evidence of aortic valve stenosis.  5. The inferior vena cava is normal in size with greater than 50% respiratory variability, suggesting right atrial pressure of 3 mmHg. Comparison(s): No prior Echocardiogram. Conclusion(s)/Recommendation(s): Normal biventricular function without evidence of hemodynamically significant valvular heart disease. FINDINGS  Left Ventricle: Left ventricular ejection fraction, by estimation, is  65 to 70%. The left ventricle has normal function. The left ventricle has no regional wall motion abnormalities. The left ventricular internal cavity size was normal in size. There is  no left ventricular hypertrophy. Left ventricular diastolic parameters are consistent with age-related delayed relaxation (normal). Right Ventricle: The right ventricular size is normal. No increase in right ventricular wall thickness. Right ventricular systolic function is normal. Left Atrium: Left atrial size was normal in size. Right Atrium: Right atrial size was normal in size. Pericardium: Trivial pericardial effusion is present. Mitral Valve: The mitral valve is normal in structure. Trivial mitral valve regurgitation. No evidence of mitral valve stenosis. Tricuspid Valve: The tricuspid valve is normal in structure. Tricuspid valve regurgitation is trivial. No evidence of tricuspid stenosis. Aortic Valve: The aortic valve is tricuspid. Aortic valve regurgitation is not visualized. Mild aortic  valve sclerosis is present, with no evidence of aortic valve stenosis. There is mild calcification of the aortic valve. Pulmonic Valve: The pulmonic valve was not well visualized. Pulmonic valve regurgitation is not visualized. No evidence of pulmonic stenosis. Aorta: The aortic root and ascending aorta are structurally normal, with no evidence of dilitation. Venous: The inferior vena cava is normal in size with greater than 50% respiratory variability, suggesting right atrial pressure of 3 mmHg. IAS/Shunts: No atrial level shunt detected by color flow Doppler.  LEFT VENTRICLE PLAX 2D LVIDd:         5.11 cm  Diastology LVIDs:         3.98 cm  LV e' lateral:   9.20 cm/s LV PW:         0.82 cm  LV E/e' lateral: 11.0 LV IVS:        0.84 cm  LV e' medial:    7.67 cm/s LVOT diam:     1.90 cm  LV E/e' medial:  13.2 LV SV:         50 LV SV Index:   23       2D Longitudinal Strain LVOT Area:     2.84 cm 2D Strain GLS (A2C):   -22.3 %                         2D Strain GLS (A3C):   -26.4 %                         2D Strain GLS (A4C):   -21.8 %                         2D Strain GLS Avg:     -23.4 % RIGHT VENTRICLE RV S prime:     11.60 cm/s TAPSE (M-mode): 1.8 cm LEFT ATRIUM             Index       RIGHT ATRIUM           Index LA diam:        4.00 cm 1.83 cm/m  RA Area:     15.80 cm LA Vol (A2C):   39.5 ml 18.11 ml/m RA Volume:   37.00 ml  16.96 ml/m LA Vol (A4C):   55.9 ml 25.63 ml/m LA Biplane Vol: 47.2 ml 21.64 ml/m  AORTIC VALVE LVOT Vmax:   86.20 cm/s LVOT Vmean:  54.900 cm/s LVOT VTI:    0.176 m  AORTA Ao Root diam: 3.10 cm MITRAL VALVE MV Area (PHT): 3.72 cm  SHUNTS MV Decel Time: 204 msec     Systemic VTI:  0.18 m MV E velocity: 101.00 cm/s  Systemic Diam: 1.90 cm MV A velocity: 83.10 cm/s MV E/A ratio:  1.22 Buford Dresser MD Electronically signed by Buford Dresser MD Signature Date/Time: 08/07/2019/2:24:07 PM    Final      Labs:   Basic Metabolic Panel: Recent Labs  Lab 08/06/19 1648  08/06/19 1648 08/07/19 0258 08/09/19 0243  NA 142  --  141 141  K 4.2   < > 3.8 4.4  CL 105  --  109 107  CO2 24  --  25 25  GLUCOSE 88  --  87 103*  BUN 20  --  14 13  CREATININE 1.05*  --  0.88 1.00  CALCIUM 9.0  --  8.9 8.8*  MG 2.1  --   --   --    < > = values in this interval not displayed.   GFR Estimated Creatinine Clearance: 64.1 mL/min (by C-G formula based on SCr of 1 mg/dL). Liver Function Tests: Recent Labs  Lab 08/06/19 1648  AST 21  ALT 18  ALKPHOS 75  BILITOT 0.6  PROT 5.9*  ALBUMIN 3.5   Recent Labs  Lab 08/08/19 1113  LIPASE 34   No results for input(s): AMMONIA in the last 168 hours. Coagulation profile No results for input(s): INR, PROTIME in the last 168 hours.  CBC: Recent Labs  Lab 08/06/19 1648 08/07/19 0258 08/09/19 0243  WBC 6.2 6.2 6.2  NEUTROABS 3.4  --   --   HGB 13.8 13.5 13.2  HCT 43.4 41.6 41.0  MCV 94.1 92.0 93.2  PLT 183 175 183   Cardiac Enzymes: No results for input(s): CKTOTAL, CKMB, CKMBINDEX, TROPONINI in the last 168 hours. BNP: Invalid input(s): POCBNP CBG: Recent Labs  Lab 08/08/19 0639 08/08/19 1130 08/08/19 1602 08/09/19 0533 08/09/19 0554  GLUCAP 86 167* 88 97 87   D-Dimer No results for input(s): DDIMER in the last 72 hours. Hgb A1c No results for input(s): HGBA1C in the last 72 hours. Lipid Profile No results for input(s): CHOL, HDL, LDLCALC, TRIG, CHOLHDL, LDLDIRECT in the last 72 hours. Thyroid function studies Recent Labs    08/06/19 1758  TSH 1.837   Anemia work up No results for input(s): VITAMINB12, FOLATE, FERRITIN, TIBC, IRON, RETICCTPCT in the last 72 hours. Microbiology Recent Results (from the past 240 hour(s))  Urine culture     Status: Abnormal   Collection Time: 08/06/19  4:45 PM   Specimen: Urine, Random  Result Value Ref Range Status   Specimen Description URINE, RANDOM  Final   Special Requests NONE  Final   Culture (A)  Final    80,000 COLONIES/mL GROUP B  STREP(S.AGALACTIAE)ISOLATED TESTING AGAINST S. AGALACTIAE NOT ROUTINELY PERFORMED DUE TO PREDICTABILITY OF AMP/PEN/VAN SUSCEPTIBILITY. Performed at [facility], [place] [address] [place], [place] [address]    Report Status 08/07/2019 FINAL  Final  SARS Coronavirus 2 by RT PCR (hospital order, performed in [facility] hospital lab) Nasopharyngeal Nasopharyngeal Swab     Status: None   Collection Time: 08/06/19  9:21 PM   Specimen: Nasopharyngeal Swab  Result Value Ref Range Status   SARS Coronavirus 2 NEGATIVE NEGATIVE Final    Comment: (NOTE) SARS-CoV-2 target nucleic acids are NOT DETECTED. The SARS-CoV-2 RNA is generally detectable in upper and lower respiratory specimens during the acute phase of infection. The lowest concentration of SARS-CoV-2 viral copies this assay can  detect is 250 copies / mL. A negative result does not preclude SARS-CoV-2 infection and should not be used as the sole basis for treatment or other patient management decisions.  A negative result may occur with improper specimen collection / handling, submission of specimen other than nasopharyngeal swab, presence of viral mutation(s) within the areas targeted by this assay, and inadequate number of viral copies (<250 copies / mL). A negative result must be combined with clinical observations, patient history, and epidemiological information. Fact Sheet for Patients:   StrictlyIdeas.no Fact Sheet for Healthcare Providers: BankingDealers.co.za This test is not yet approved or cleared  by the [place] FDA and has been authorized for detection and/or diagnosis of SARS-CoV-2 by FDA under an Emergency Use Authorization (EUA).  This EUA will remain in effect (meaning this test can be used) for the duration of the COVID-19 declaration under Section 564(b)(1) of the Act, 21 U.S.C. section 360bbb-3(b)(1), unless the authorization is terminated or revoked  sooner. Performed at [facility], [place] [address], [place], [place] [address]      Discharge Instructions:   Discharge Instructions    Call MD for:  difficulty breathing, headache or visual disturbances   Complete by: As directed    Call MD for:  persistant dizziness or light-headedness   Complete by: As directed    Call MD for:  persistant dizziness or light-headedness   Complete by: As directed    Call MD for:  temperature >100.4   Complete by: As directed    Call MD for:  temperature >100.4   Complete by: As directed    Diet - low sodium heart healthy   Complete by: As directed    Diet - low sodium heart healthy   Complete by: As directed    Discharge instructions   Complete by: As directed    Cardiology will mail you event monitor. Follow instructions Follow up with cardiology 5 weeks Monitor BP daily at the same time of day. Keep a log of readings. Take data to cardiology follow up visit   Discharge instructions   Complete by: As directed    Take medications as prescribed Follow up with PCP 1-2 weeks for evaluation of symptoms and/or episodes. Recommend keeping a journal of episodes to share with PCP Cardiology will mail event monitor to you Follow up with cardiology 4-5 weeks for evaluation of event monitor data   Increase activity slowly   Complete by: As directed    Increase activity slowly   Complete by: As directed      Allergies as of 08/09/2019      Reactions   Oxycontin [oxycodone] Nausea Only   Gadolinium Derivatives Swelling, Rash   Patient reported having rash on face and swelling of eyes the day after administration of MRI contrast on two occasions.        Medication List    TAKE these medications   clonazePAM 1 MG tablet Commonly known as: KLONOPIN Take 1 mg by mouth at bedtime.   pantoprazole 40 MG tablet Commonly known as: PROTONIX Take 1 tablet (40 mg total) by mouth 2 (two) times daily.   traZODone 50 MG tablet Commonly known as:  DESYREL Take 50-150 mg by mouth at bedtime as needed for sleep.   venlafaxine XR 75 MG 24 hr capsule Commonly known as: EFFEXOR-XR Take 75 mg by mouth daily with breakfast.      Follow-up Information    Nahser, Wonda Cheng, MD Follow up.   Specialty:  Cardiology Why:  7/14 @9 :40 am Contact information: [place] [address] [place] [address] [phone]            Time coordinating discharge: 40 minutes  Signed:  Radene Gunning NP  [facility] 08/09/2019, 11:08 AM

## 2019-08-09 NOTE — Progress Notes (Signed)
RN gave pt discharge instructions and she stated understanding. One new medication escribed to pt home pharmacy, IV has been removed and tele removed pt getting dressed and husband on the way.

## 2019-08-10 ENCOUNTER — Ambulatory Visit: Payer: Medicare Other | Admitting: Physical Therapy

## 2019-08-11 ENCOUNTER — Ambulatory Visit (INDEPENDENT_AMBULATORY_CARE_PROVIDER_SITE_OTHER): Payer: Medicare Other

## 2019-08-11 DIAGNOSIS — R002 Palpitations: Secondary | ICD-10-CM | POA: Diagnosis not present

## 2019-08-11 DIAGNOSIS — R55 Syncope and collapse: Secondary | ICD-10-CM

## 2019-08-11 DIAGNOSIS — R001 Bradycardia, unspecified: Secondary | ICD-10-CM

## 2019-08-14 ENCOUNTER — Ambulatory Visit: Payer: Medicare Other

## 2019-08-15 ENCOUNTER — Ambulatory Visit: Payer: Medicare Other | Admitting: Physical Therapy

## 2019-08-15 DIAGNOSIS — R030 Elevated blood-pressure reading, without diagnosis of hypertension: Secondary | ICD-10-CM | POA: Diagnosis not present

## 2019-08-15 DIAGNOSIS — H1031 Unspecified acute conjunctivitis, right eye: Secondary | ICD-10-CM | POA: Diagnosis not present

## 2019-08-15 DIAGNOSIS — R55 Syncope and collapse: Secondary | ICD-10-CM | POA: Diagnosis not present

## 2019-08-16 DIAGNOSIS — M1180 Other specified crystal arthropathies, unspecified site: Secondary | ICD-10-CM | POA: Diagnosis not present

## 2019-08-16 DIAGNOSIS — N3941 Urge incontinence: Secondary | ICD-10-CM | POA: Diagnosis not present

## 2019-08-16 DIAGNOSIS — M170 Bilateral primary osteoarthritis of knee: Secondary | ICD-10-CM | POA: Diagnosis not present

## 2019-08-16 DIAGNOSIS — R5382 Chronic fatigue, unspecified: Secondary | ICD-10-CM | POA: Diagnosis not present

## 2019-08-16 DIAGNOSIS — M5136 Other intervertebral disc degeneration, lumbar region: Secondary | ICD-10-CM | POA: Diagnosis not present

## 2019-08-16 DIAGNOSIS — K219 Gastro-esophageal reflux disease without esophagitis: Secondary | ICD-10-CM | POA: Diagnosis not present

## 2019-08-16 DIAGNOSIS — R001 Bradycardia, unspecified: Secondary | ICD-10-CM | POA: Diagnosis not present

## 2019-08-16 DIAGNOSIS — M6281 Muscle weakness (generalized): Secondary | ICD-10-CM | POA: Diagnosis not present

## 2019-08-16 DIAGNOSIS — R531 Weakness: Secondary | ICD-10-CM | POA: Diagnosis not present

## 2019-08-16 DIAGNOSIS — G4700 Insomnia, unspecified: Secondary | ICD-10-CM | POA: Diagnosis not present

## 2019-08-16 DIAGNOSIS — F0390 Unspecified dementia without behavioral disturbance: Secondary | ICD-10-CM | POA: Diagnosis not present

## 2019-08-17 ENCOUNTER — Ambulatory Visit: Payer: Medicare Other | Admitting: Physical Therapy

## 2019-08-22 ENCOUNTER — Telehealth: Payer: Self-pay | Admitting: Physical Therapy

## 2019-08-22 ENCOUNTER — Ambulatory Visit: Payer: Medicare Other | Admitting: Physical Therapy

## 2019-08-22 NOTE — Telephone Encounter (Signed)
Called patient when she did not show up for her appointment today. Patient states she is waiting for clearance from the cardiologist to come back to PT. She will see him at the end of the month. She is wearing a heart monitor until then. Agreed that we will cancel her remaining appointments and will schedule again when she is cleared by the cardiologist. Updated administrative staff.   Everlean Alstrom. Graylon Good, PT, DPT 08/22/19, 1:32 PM

## 2019-08-23 LAB — COLOGUARD

## 2019-08-24 ENCOUNTER — Ambulatory Visit: Payer: Medicare Other | Admitting: Physical Therapy

## 2019-08-29 ENCOUNTER — Ambulatory Visit: Payer: Medicare Other | Admitting: Physical Therapy

## 2019-08-31 ENCOUNTER — Encounter: Admitting: Physical Therapy

## 2019-09-05 ENCOUNTER — Encounter: Admitting: Physical Therapy

## 2019-09-05 DIAGNOSIS — H11823 Conjunctivochalasis, bilateral: Secondary | ICD-10-CM | POA: Diagnosis not present

## 2019-09-05 DIAGNOSIS — H04201 Unspecified epiphora, right lacrimal gland: Secondary | ICD-10-CM | POA: Diagnosis not present

## 2019-09-07 ENCOUNTER — Encounter: Admitting: Physical Therapy

## 2019-09-07 LAB — COLOGUARD: COLOGUARD: NEGATIVE

## 2019-09-12 ENCOUNTER — Encounter: Admitting: Physical Therapy

## 2019-09-14 ENCOUNTER — Encounter: Admitting: Physical Therapy

## 2019-09-19 ENCOUNTER — Encounter: Admitting: Physical Therapy

## 2019-09-21 ENCOUNTER — Encounter: Admitting: Physical Therapy

## 2019-10-04 ENCOUNTER — Encounter: Payer: Self-pay | Admitting: *Deleted

## 2019-10-04 ENCOUNTER — Encounter: Payer: Self-pay | Admitting: Cardiovascular Disease

## 2019-10-04 ENCOUNTER — Other Ambulatory Visit: Payer: Self-pay

## 2019-10-04 ENCOUNTER — Ambulatory Visit (INDEPENDENT_AMBULATORY_CARE_PROVIDER_SITE_OTHER): Payer: Medicare Other | Admitting: Cardiovascular Disease

## 2019-10-04 VITALS — BP 106/72 | HR 62 | Ht 69.0 in | Wt 222.6 lb

## 2019-10-04 DIAGNOSIS — R55 Syncope and collapse: Secondary | ICD-10-CM | POA: Diagnosis not present

## 2019-10-04 NOTE — Progress Notes (Signed)
Cardiology Office Note:    Date:  10/04/2019   ID:  [name], DOB 18-Dec-1945, MRN [id number]  PCP:  Merrilee Seashore, MD  [facility] Cardiologist:  Mertie Moores, MD  [facility] Electrophysiologist:  None   Referring MD: Merrilee Seashore, MD   Chief Complaint  Patient presents with   Bradycardia    History of Present Illness:    [name] is a 74 y.o. female with a hx of near syncope and bradycardia.  I saw her in the hospital in May for evaluation of sinus bradycardia.  She was monitored in the hospital after being admitted for an episode of near syncope.  No episodes of significant bradycardia were seen.  She was found to have sinus rhythm with sinus arrhythmia.  She had rare premature ventricular contraction but no significant arrhythmias. She wore an event monitor.  There were no significant arrhythmias.  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 No recurrent episodes of presyncope    Past Medical History:  Diagnosis Date   Arthritis    Bilateral chronic knee pain    Chronic back pain    Depression    Sinus bradycardia     Past Surgical History:  Procedure Laterality Date   ABDOMINAL HYSTERECTOMY  1995   back sugery     BUNIONECTOMY  2013   rt foot   COLONOSCOPY     MUSCLE BIOPSY Left 10/03/2012   Procedure: LEFT QUADRICEP MUSCLE BIOPSY;  Surgeon: Odis Hollingshead, MD;  Location: [facility];  Service: General;  Laterality: Left;   NECK SURGERY  2010   cerv disc fused     Current Medications: Current Meds  Medication Sig   clonazePAM (KLONOPIN) 1 MG tablet Take 1 mg by mouth at bedtime.    pantoprazole (PROTONIX) 40 MG tablet Take 1 tablet (40 mg total) by mouth 2 (two) times daily.   traZODone (DESYREL) 50 MG tablet Take 50-150 mg by mouth at bedtime as needed for sleep.    venlafaxine XR (EFFEXOR-XR) 75 MG 24 hr capsule Take 75 mg by mouth daily with  breakfast.     Allergies:   Oxycontin [oxycodone] and Gadolinium derivatives   Social History   Socioeconomic History   Marital status: Married    Spouse name: Not on file   Number of children: Not on file   Years of education: Not on file   Highest education level: Not on file  Occupational History   Not on file  Tobacco Use   Smoking status: Former Smoker    Quit date: 03/23/1998    Years since quitting: 21.5   Smokeless tobacco: Never Used  Substance and Sexual Activity   Alcohol use: No   Drug use: No   Sexual activity: Not on file  Other Topics Concern   Not on file  Social History Narrative   Not on file   Social Determinants of Health   Financial Resource Strain:    Difficulty of Paying Living Expenses:   Food Insecurity:    Worried About Charity fundraiser in the Last Year:    Arboriculturist in the Last Year:   Transportation Needs:    Film/video editor (Medical):    Lack of Transportation (Non-Medical):   Physical Activity:    Days of Exercise per Week:    Minutes of Exercise per Session:   Stress:  Feeling of Stress :   Social Connections:    Frequency of Communication with Friends and Family:    Frequency of Social Gatherings with Friends and Family:    Attends Religious Services:    Active Member of Clubs or Organizations:    Attends Archivist Meetings:    Marital Status:      Family History: The patient's family history includes Cancer in her sister; Hypertension in her mother.  ROS:   Please see the history of present illness.     All other systems reviewed and are negative.  EKGs/Labs/Other Studies Reviewed:    The following studies were reviewed today:   EKG:   *  Recent Labs: 08/06/2019: ALT 18; Magnesium 2.1; TSH 1.837 08/09/2019: BUN 13; Creatinine, Ser 1.00; Hemoglobin 13.2; Platelets 183; Potassium 4.4; Sodium 141  Recent Lipid Panel No results found for: CHOL, TRIG, HDL, CHOLHDL,  VLDL, LDLCALC, LDLDIRECT  Physical Exam:    VS:  BP 106/72    Pulse 62    Ht 5\' 9"  (1.753 m)    Wt 222 lb 9.6 oz (101 kg)    SpO2 100%    BMI 32.87 kg/m     Wt Readings from Last 3 Encounters:  10/04/19 222 lb 9.6 oz (101 kg)  08/07/19 227 lb 4.7 oz (103.1 kg)  01/23/16 237 lb (107.5 kg)     GEN:  Well nourished, well developed in no acute distress HEENT: Normal NECK: No JVD; No carotid bruits LYMPHATICS: No lymphadenopathy CARDIAC: RRR, no murmurs, rubs, gallops RESPIRATORY:  Clear to auscultation without rales, wheezing or rhonchi  ABDOMEN: Soft, non-tender, non-distended MUSCULOSKELETAL:  No edema; No deformity  SKIN: Warm and dry NEUROLOGIC:  Alert and oriented x 3 PSYCHIATRIC:  Normal affect   ASSESSMENT:    No diagnosis found. PLAN:    In order of problems listed above:  1. Presyncope: [name] presents for follow-up of her episode of presyncope.  She is not having recurrent episodes of presyncope.  She did not have any significant arrhythmias     the hospital and her event monitor does not reveal any significant arrhythmias.    During the hospitalization.  She wore an event monitor and did not have any significant arrhythmias recorded at that time either.  At this point we will see her on an as-needed basis.  I do not have a clear-cut explanation but she should follow-up with her primary medical doctor.   Medication Adjustments/Labs and Tests Ordered: Current medicines are reviewed at length with the patient today.  Concerns regarding medicines are outlined above.  No orders of the defined types were placed in this encounter.  No orders of the defined types were placed in this encounter.   There are no Patient Instructions on file for this visit.   Signed, Mertie Moores, MD  10/04/2019 10:14 AM    [place]

## 2019-10-04 NOTE — Patient Instructions (Signed)
Medication Instructions:  No changes   Lab Work: None  Testing/Procedures: none   Follow-Up: Your physician recommends that you schedule a follow-up appointment as needed with Dr. Acie Fredrickson.  Other Instructions Letter provided today indicating ok from cardiovascular standpoint to resume physical therapy.

## 2019-10-10 ENCOUNTER — Other Ambulatory Visit: Payer: Self-pay

## 2019-10-10 ENCOUNTER — Ambulatory Visit: Payer: Medicare Other | Attending: Physician Assistant | Admitting: Physical Therapy

## 2019-10-10 VITALS — BP 120/66 | HR 60

## 2019-10-10 DIAGNOSIS — R262 Difficulty in walking, not elsewhere classified: Secondary | ICD-10-CM | POA: Diagnosis not present

## 2019-10-10 DIAGNOSIS — R2681 Unsteadiness on feet: Secondary | ICD-10-CM | POA: Diagnosis not present

## 2019-10-10 DIAGNOSIS — M25562 Pain in left knee: Secondary | ICD-10-CM | POA: Diagnosis not present

## 2019-10-10 DIAGNOSIS — M79604 Pain in right leg: Secondary | ICD-10-CM | POA: Insufficient documentation

## 2019-10-10 DIAGNOSIS — M6281 Muscle weakness (generalized): Secondary | ICD-10-CM | POA: Diagnosis not present

## 2019-10-10 DIAGNOSIS — M79605 Pain in left leg: Secondary | ICD-10-CM | POA: Diagnosis not present

## 2019-10-10 DIAGNOSIS — M25561 Pain in right knee: Secondary | ICD-10-CM
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
## 2019-10-12 ENCOUNTER — Other Ambulatory Visit: Payer: Self-pay

## 2019-10-12 ENCOUNTER — Ambulatory Visit: Payer: Medicare Other | Admitting: Physical Therapy

## 2019-10-12 ENCOUNTER — Encounter: Payer: Self-pay | Admitting: Physical Therapy

## 2019-10-12 DIAGNOSIS — M79605 Pain in left leg: Secondary | ICD-10-CM

## 2019-10-12 DIAGNOSIS — M6281 Muscle weakness (generalized): Secondary | ICD-10-CM | POA: Diagnosis not present

## 2019-10-12 DIAGNOSIS — M25561 Pain in right knee: Secondary | ICD-10-CM

## 2019-10-12 DIAGNOSIS — M25562 Pain in left knee: Secondary | ICD-10-CM | POA: Diagnosis not present

## 2019-10-12 DIAGNOSIS — R262 Difficulty in walking, not elsewhere classified: Secondary | ICD-10-CM

## 2019-10-12 DIAGNOSIS — M79604 Pain in right leg: Secondary | ICD-10-CM | POA: Diagnosis not present

## 2019-10-12 DIAGNOSIS — R2681 Unsteadiness on feet: Secondary | ICD-10-CM
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
## 2019-10-17 ENCOUNTER — Other Ambulatory Visit: Payer: Self-pay

## 2019-10-17 ENCOUNTER — Encounter: Payer: Self-pay | Admitting: Physical Therapy

## 2019-10-17 ENCOUNTER — Ambulatory Visit: Payer: Medicare Other | Admitting: Physical Therapy

## 2019-10-17 DIAGNOSIS — M79604 Pain in right leg: Secondary | ICD-10-CM | POA: Diagnosis not present

## 2019-10-17 DIAGNOSIS — M6281 Muscle weakness (generalized): Secondary | ICD-10-CM

## 2019-10-17 DIAGNOSIS — M25561 Pain in right knee: Secondary | ICD-10-CM

## 2019-10-17 DIAGNOSIS — R262 Difficulty in walking, not elsewhere classified: Secondary | ICD-10-CM

## 2019-10-17 DIAGNOSIS — M79605 Pain in left leg: Secondary | ICD-10-CM

## 2019-10-17 DIAGNOSIS — R2681 Unsteadiness on feet: Secondary | ICD-10-CM | POA: Diagnosis not present

## 2019-10-17 DIAGNOSIS — M25562 Pain in left knee: Secondary | ICD-10-CM | POA: Diagnosis not present
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
## 2019-10-25 ENCOUNTER — Ambulatory Visit: Payer: Medicare Other | Attending: Physician Assistant | Admitting: Physical Therapy

## 2019-10-25 ENCOUNTER — Encounter: Payer: Self-pay | Admitting: Physical Therapy

## 2019-10-25 ENCOUNTER — Other Ambulatory Visit: Payer: Self-pay

## 2019-10-25 DIAGNOSIS — M79605 Pain in left leg: Secondary | ICD-10-CM | POA: Diagnosis not present

## 2019-10-25 DIAGNOSIS — R2681 Unsteadiness on feet: Secondary | ICD-10-CM | POA: Diagnosis not present

## 2019-10-25 DIAGNOSIS — M25561 Pain in right knee: Secondary | ICD-10-CM | POA: Insufficient documentation

## 2019-10-25 DIAGNOSIS — M79604 Pain in right leg: Secondary | ICD-10-CM | POA: Insufficient documentation

## 2019-10-25 DIAGNOSIS — M6281 Muscle weakness (generalized): Secondary | ICD-10-CM | POA: Diagnosis not present

## 2019-10-25 DIAGNOSIS — R262 Difficulty in walking, not elsewhere classified: Secondary | ICD-10-CM | POA: Insufficient documentation

## 2019-10-25 DIAGNOSIS — M25562 Pain in left knee: Secondary | ICD-10-CM | POA: Insufficient documentation
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
## 2019-11-01 ENCOUNTER — Other Ambulatory Visit: Payer: Self-pay

## 2019-11-01 ENCOUNTER — Ambulatory Visit: Payer: Medicare Other | Admitting: Physical Therapy

## 2019-11-01 ENCOUNTER — Encounter: Payer: Self-pay | Admitting: Physical Therapy

## 2019-11-01 DIAGNOSIS — R262 Difficulty in walking, not elsewhere classified: Secondary | ICD-10-CM | POA: Diagnosis not present

## 2019-11-01 DIAGNOSIS — M79604 Pain in right leg: Secondary | ICD-10-CM | POA: Diagnosis not present

## 2019-11-01 DIAGNOSIS — M79605 Pain in left leg: Secondary | ICD-10-CM

## 2019-11-01 DIAGNOSIS — M25562 Pain in left knee: Secondary | ICD-10-CM

## 2019-11-01 DIAGNOSIS — M6281 Muscle weakness (generalized): Secondary | ICD-10-CM

## 2019-11-01 DIAGNOSIS — R2681 Unsteadiness on feet: Secondary | ICD-10-CM | POA: Diagnosis not present

## 2019-11-01 DIAGNOSIS — M25561 Pain in right knee: Secondary | ICD-10-CM
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
## 2019-11-07 ENCOUNTER — Other Ambulatory Visit: Payer: Self-pay

## 2019-11-07 ENCOUNTER — Encounter: Payer: Self-pay | Admitting: Physical Therapy

## 2019-11-07 ENCOUNTER — Ambulatory Visit: Payer: Medicare Other | Admitting: Physical Therapy

## 2019-11-07 DIAGNOSIS — R262 Difficulty in walking, not elsewhere classified: Secondary | ICD-10-CM | POA: Diagnosis not present

## 2019-11-07 DIAGNOSIS — M6281 Muscle weakness (generalized): Secondary | ICD-10-CM | POA: Diagnosis not present

## 2019-11-07 DIAGNOSIS — R2681 Unsteadiness on feet: Secondary | ICD-10-CM | POA: Diagnosis not present

## 2019-11-07 DIAGNOSIS — M79605 Pain in left leg: Secondary | ICD-10-CM | POA: Diagnosis not present

## 2019-11-07 DIAGNOSIS — M25562 Pain in left knee: Secondary | ICD-10-CM | POA: Diagnosis not present

## 2019-11-07 DIAGNOSIS — M79604 Pain in right leg: Secondary | ICD-10-CM

## 2019-11-07 DIAGNOSIS — M25561 Pain in right knee: Secondary | ICD-10-CM
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
## 2019-11-09 ENCOUNTER — Encounter: Payer: Medicare Other | Admitting: Physical Therapy

## 2019-11-14 DIAGNOSIS — H02122 Mechanical ectropion of right lower eyelid: Secondary | ICD-10-CM | POA: Diagnosis not present

## 2019-11-14 DIAGNOSIS — H40023 Open angle with borderline findings, high risk, bilateral: Secondary | ICD-10-CM | POA: Diagnosis not present

## 2019-11-21 ENCOUNTER — Ambulatory Visit: Payer: Medicare Other | Admitting: Physical Therapy

## 2019-11-23 ENCOUNTER — Ambulatory Visit: Payer: Medicare Other | Admitting: Physical Therapy

## 2019-11-28 ENCOUNTER — Encounter: Payer: Self-pay | Admitting: Physical Therapy

## 2019-11-28 ENCOUNTER — Ambulatory Visit: Payer: Medicare Other | Attending: Physician Assistant | Admitting: Physical Therapy

## 2019-11-28 ENCOUNTER — Other Ambulatory Visit: Payer: Self-pay

## 2019-11-28 DIAGNOSIS — M25562 Pain in left knee: Secondary | ICD-10-CM | POA: Insufficient documentation

## 2019-11-28 DIAGNOSIS — M79604 Pain in right leg: Secondary | ICD-10-CM | POA: Insufficient documentation

## 2019-11-28 DIAGNOSIS — M79605 Pain in left leg: Secondary | ICD-10-CM | POA: Diagnosis not present

## 2019-11-28 DIAGNOSIS — M6281 Muscle weakness (generalized): Secondary | ICD-10-CM | POA: Diagnosis not present

## 2019-11-28 DIAGNOSIS — R2681 Unsteadiness on feet: Secondary | ICD-10-CM | POA: Insufficient documentation

## 2019-11-28 DIAGNOSIS — R262 Difficulty in walking, not elsewhere classified: Secondary | ICD-10-CM | POA: Diagnosis not present

## 2019-11-28 DIAGNOSIS — M25561 Pain in right knee: Secondary | ICD-10-CM
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
## 2019-11-30 ENCOUNTER — Ambulatory Visit: Payer: Medicare Other | Admitting: Physical Therapy

## 2019-11-30 ENCOUNTER — Encounter: Payer: Self-pay | Admitting: Physical Therapy

## 2019-11-30 ENCOUNTER — Other Ambulatory Visit: Payer: Self-pay

## 2019-11-30 DIAGNOSIS — M25562 Pain in left knee: Secondary | ICD-10-CM

## 2019-11-30 DIAGNOSIS — M79605 Pain in left leg: Secondary | ICD-10-CM

## 2019-11-30 DIAGNOSIS — M79604 Pain in right leg: Secondary | ICD-10-CM | POA: Diagnosis not present

## 2019-11-30 DIAGNOSIS — R2681 Unsteadiness on feet: Secondary | ICD-10-CM

## 2019-11-30 DIAGNOSIS — M25561 Pain in right knee: Secondary | ICD-10-CM

## 2019-11-30 DIAGNOSIS — M6281 Muscle weakness (generalized): Secondary | ICD-10-CM

## 2019-11-30 DIAGNOSIS — R262 Difficulty in walking, not elsewhere classified: Secondary | ICD-10-CM | POA: Diagnosis not present
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
## 2019-12-05 ENCOUNTER — Ambulatory Visit: Payer: Medicare Other | Admitting: Physical Therapy

## 2019-12-05 ENCOUNTER — Encounter: Payer: Self-pay | Admitting: Physical Therapy

## 2019-12-05 ENCOUNTER — Other Ambulatory Visit: Payer: Self-pay

## 2019-12-05 DIAGNOSIS — M79605 Pain in left leg: Secondary | ICD-10-CM | POA: Diagnosis not present

## 2019-12-05 DIAGNOSIS — R2681 Unsteadiness on feet: Secondary | ICD-10-CM | POA: Diagnosis not present

## 2019-12-05 DIAGNOSIS — M25561 Pain in right knee: Secondary | ICD-10-CM

## 2019-12-05 DIAGNOSIS — R262 Difficulty in walking, not elsewhere classified: Secondary | ICD-10-CM

## 2019-12-05 DIAGNOSIS — M79604 Pain in right leg: Secondary | ICD-10-CM | POA: Diagnosis not present

## 2019-12-05 DIAGNOSIS — M6281 Muscle weakness (generalized): Secondary | ICD-10-CM | POA: Diagnosis not present

## 2019-12-05 DIAGNOSIS — M25562 Pain in left knee: Secondary | ICD-10-CM | POA: Diagnosis not present
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
## 2019-12-07 ENCOUNTER — Ambulatory Visit: Payer: Medicare Other | Admitting: Physical Therapy

## 2019-12-07 ENCOUNTER — Encounter: Payer: Self-pay | Admitting: Physical Therapy

## 2019-12-07 ENCOUNTER — Other Ambulatory Visit: Payer: Self-pay

## 2019-12-07 DIAGNOSIS — R262 Difficulty in walking, not elsewhere classified: Secondary | ICD-10-CM

## 2019-12-07 DIAGNOSIS — M25562 Pain in left knee: Secondary | ICD-10-CM | POA: Diagnosis not present

## 2019-12-07 DIAGNOSIS — M79604 Pain in right leg: Secondary | ICD-10-CM

## 2019-12-07 DIAGNOSIS — M79605 Pain in left leg: Secondary | ICD-10-CM | POA: Diagnosis not present

## 2019-12-07 DIAGNOSIS — R2681 Unsteadiness on feet: Secondary | ICD-10-CM

## 2019-12-07 DIAGNOSIS — R768 Other specified abnormal immunological findings in serum: Secondary | ICD-10-CM | POA: Diagnosis not present

## 2019-12-07 DIAGNOSIS — M1180 Other specified crystal arthropathies, unspecified site: Secondary | ICD-10-CM | POA: Diagnosis not present

## 2019-12-07 DIAGNOSIS — M6281 Muscle weakness (generalized): Secondary | ICD-10-CM

## 2019-12-07 DIAGNOSIS — M25561 Pain in right knee: Secondary | ICD-10-CM

## 2019-12-07 DIAGNOSIS — M170 Bilateral primary osteoarthritis of knee: Secondary | ICD-10-CM | POA: Diagnosis not present
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
## 2019-12-12 ENCOUNTER — Ambulatory Visit: Payer: Medicare Other | Admitting: Physical Therapy

## 2019-12-12 ENCOUNTER — Other Ambulatory Visit: Payer: Self-pay

## 2019-12-12 ENCOUNTER — Encounter: Payer: Self-pay | Admitting: Physical Therapy

## 2019-12-12 DIAGNOSIS — M79604 Pain in right leg: Secondary | ICD-10-CM

## 2019-12-12 DIAGNOSIS — R262 Difficulty in walking, not elsewhere classified: Secondary | ICD-10-CM | POA: Diagnosis not present

## 2019-12-12 DIAGNOSIS — R2681 Unsteadiness on feet: Secondary | ICD-10-CM

## 2019-12-12 DIAGNOSIS — M6281 Muscle weakness (generalized): Secondary | ICD-10-CM

## 2019-12-12 DIAGNOSIS — M25561 Pain in right knee: Secondary | ICD-10-CM

## 2019-12-12 DIAGNOSIS — M25562 Pain in left knee: Secondary | ICD-10-CM

## 2019-12-12 DIAGNOSIS — M79605 Pain in left leg: Secondary | ICD-10-CM | POA: Diagnosis not present
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
## 2019-12-14 ENCOUNTER — Ambulatory Visit: Payer: Medicare Other | Admitting: Physical Therapy

## 2019-12-19 ENCOUNTER — Encounter: Payer: Self-pay | Admitting: Physical Therapy

## 2019-12-19 ENCOUNTER — Ambulatory Visit: Payer: Medicare Other | Admitting: Physical Therapy

## 2019-12-19 ENCOUNTER — Other Ambulatory Visit: Payer: Self-pay

## 2019-12-19 DIAGNOSIS — M6281 Muscle weakness (generalized): Secondary | ICD-10-CM | POA: Diagnosis not present

## 2019-12-19 DIAGNOSIS — M25562 Pain in left knee: Secondary | ICD-10-CM | POA: Diagnosis not present

## 2019-12-19 DIAGNOSIS — M25561 Pain in right knee: Secondary | ICD-10-CM

## 2019-12-19 DIAGNOSIS — M79604 Pain in right leg: Secondary | ICD-10-CM | POA: Diagnosis not present

## 2019-12-19 DIAGNOSIS — R262 Difficulty in walking, not elsewhere classified: Secondary | ICD-10-CM | POA: Diagnosis not present

## 2019-12-19 DIAGNOSIS — R2681 Unsteadiness on feet: Secondary | ICD-10-CM

## 2019-12-19 DIAGNOSIS — M79605 Pain in left leg: Secondary | ICD-10-CM | POA: Diagnosis not present
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
## 2019-12-21 ENCOUNTER — Ambulatory Visit: Payer: Medicare Other | Admitting: Physical Therapy

## 2019-12-21 ENCOUNTER — Other Ambulatory Visit: Payer: Self-pay

## 2019-12-21 ENCOUNTER — Encounter: Payer: Self-pay | Admitting: Physical Therapy

## 2019-12-21 DIAGNOSIS — M25561 Pain in right knee: Secondary | ICD-10-CM

## 2019-12-21 DIAGNOSIS — M25562 Pain in left knee: Secondary | ICD-10-CM

## 2019-12-21 DIAGNOSIS — R262 Difficulty in walking, not elsewhere classified: Secondary | ICD-10-CM | POA: Diagnosis not present

## 2019-12-21 DIAGNOSIS — M6281 Muscle weakness (generalized): Secondary | ICD-10-CM | POA: Diagnosis not present

## 2019-12-21 DIAGNOSIS — M79605 Pain in left leg: Secondary | ICD-10-CM | POA: Diagnosis not present

## 2019-12-21 DIAGNOSIS — R2681 Unsteadiness on feet: Secondary | ICD-10-CM

## 2019-12-21 DIAGNOSIS — M79604 Pain in right leg: Secondary | ICD-10-CM
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
## 2019-12-26 ENCOUNTER — Encounter: Payer: Medicare Other | Admitting: Physical Therapy

## 2019-12-26 DIAGNOSIS — H9313 Tinnitus, bilateral: Secondary | ICD-10-CM | POA: Diagnosis not present

## 2019-12-26 DIAGNOSIS — F322 Major depressive disorder, single episode, severe without psychotic features: Secondary | ICD-10-CM | POA: Diagnosis not present

## 2019-12-26 DIAGNOSIS — F0390 Unspecified dementia without behavioral disturbance: Secondary | ICD-10-CM | POA: Diagnosis not present

## 2019-12-26 DIAGNOSIS — M6281 Muscle weakness (generalized): Secondary | ICD-10-CM | POA: Diagnosis not present

## 2019-12-26 DIAGNOSIS — G959 Disease of spinal cord, unspecified: Secondary | ICD-10-CM | POA: Diagnosis not present

## 2019-12-26 DIAGNOSIS — R5382 Chronic fatigue, unspecified: Secondary | ICD-10-CM | POA: Diagnosis not present

## 2019-12-26 DIAGNOSIS — E7800 Pure hypercholesterolemia, unspecified: Secondary | ICD-10-CM | POA: Diagnosis not present

## 2020-01-02 ENCOUNTER — Encounter: Payer: Medicare Other | Admitting: Physical Therapy

## 2020-01-03 DIAGNOSIS — R531 Weakness: Secondary | ICD-10-CM | POA: Diagnosis not present

## 2020-01-03 DIAGNOSIS — G4700 Insomnia, unspecified: Secondary | ICD-10-CM | POA: Diagnosis not present

## 2020-01-03 DIAGNOSIS — F419 Anxiety disorder, unspecified: Secondary | ICD-10-CM | POA: Diagnosis not present

## 2020-01-03 DIAGNOSIS — K5901 Slow transit constipation: Secondary | ICD-10-CM | POA: Diagnosis not present

## 2020-01-03 DIAGNOSIS — R413 Other amnesia: Secondary | ICD-10-CM | POA: Diagnosis not present

## 2020-01-03 DIAGNOSIS — E669 Obesity, unspecified: Secondary | ICD-10-CM | POA: Diagnosis not present

## 2020-01-03 DIAGNOSIS — K219 Gastro-esophageal reflux disease without esophagitis: Secondary | ICD-10-CM | POA: Diagnosis not present

## 2020-01-03 DIAGNOSIS — N3941 Urge incontinence: Secondary | ICD-10-CM | POA: Diagnosis not present

## 2020-01-03 DIAGNOSIS — R7303 Prediabetes: Secondary | ICD-10-CM | POA: Diagnosis not present

## 2020-01-09 ENCOUNTER — Encounter: Payer: Medicare Other | Admitting: Physical Therapy

## 2020-01-14 ENCOUNTER — Emergency Department (HOSPITAL_COMMUNITY): Payer: Medicare Other

## 2020-01-14 ENCOUNTER — Emergency Department (HOSPITAL_COMMUNITY)
Admission: EM | Admit: 2020-01-14 | Discharge: 2020-01-15 | Disposition: A | Discharge status: Home / Self Care | Payer: Medicare Other | Attending: Emergency Medicine | Admitting: Emergency Medicine

## 2020-01-14 ENCOUNTER — Other Ambulatory Visit: Payer: Self-pay

## 2020-01-14 DIAGNOSIS — M62838 Other muscle spasm: Secondary | ICD-10-CM

## 2020-01-14 DIAGNOSIS — R079 Chest pain, unspecified: Secondary | ICD-10-CM | POA: Diagnosis not present

## 2020-01-14 DIAGNOSIS — S3991XA Unspecified injury of abdomen, initial encounter: Secondary | ICD-10-CM | POA: Diagnosis not present

## 2020-01-14 DIAGNOSIS — M25561 Pain in right knee: Secondary | ICD-10-CM | POA: Diagnosis not present

## 2020-01-14 DIAGNOSIS — M25571 Pain in right ankle and joints of right foot: Secondary | ICD-10-CM | POA: Diagnosis not present

## 2020-01-14 DIAGNOSIS — M7989 Other specified soft tissue disorders: Secondary | ICD-10-CM | POA: Diagnosis not present

## 2020-01-14 DIAGNOSIS — M25461 Effusion, right knee: Secondary | ICD-10-CM | POA: Diagnosis not present

## 2020-01-14 DIAGNOSIS — Z87891 Personal history of nicotine dependence: Secondary | ICD-10-CM | POA: Diagnosis not present

## 2020-01-14 DIAGNOSIS — R110 Nausea: Secondary | ICD-10-CM | POA: Diagnosis not present

## 2020-01-14 DIAGNOSIS — Y92810 Car as the place of occurrence of the external cause: Secondary | ICD-10-CM | POA: Insufficient documentation

## 2020-01-14 DIAGNOSIS — M545 Low back pain, unspecified: Secondary | ICD-10-CM | POA: Diagnosis not present

## 2020-01-14 DIAGNOSIS — W19XXXA Unspecified fall, initial encounter: Secondary | ICD-10-CM | POA: Diagnosis not present

## 2020-01-14 DIAGNOSIS — R109 Unspecified abdominal pain: Secondary | ICD-10-CM | POA: Insufficient documentation

## 2020-01-14 DIAGNOSIS — R519 Headache, unspecified: Secondary | ICD-10-CM | POA: Insufficient documentation

## 2020-01-14 DIAGNOSIS — S299XXA Unspecified injury of thorax, initial encounter: Secondary | ICD-10-CM | POA: Diagnosis not present

## 2020-01-14 DIAGNOSIS — S0990XA Unspecified injury of head, initial encounter: Secondary | ICD-10-CM | POA: Diagnosis not present

## 2020-01-14 DIAGNOSIS — R609 Edema, unspecified: Secondary | ICD-10-CM | POA: Diagnosis not present

## 2020-01-14 DIAGNOSIS — Z043 Encounter for examination and observation following other accident: Secondary | ICD-10-CM | POA: Diagnosis not present

## 2020-01-14 DIAGNOSIS — I10 Essential (primary) hypertension: Secondary | ICD-10-CM | POA: Diagnosis not present

## 2020-01-14 LAB — COMPREHENSIVE METABOLIC PANEL
ALT: 19 U/L (ref 0–44)
AST: 21 U/L (ref 15–41)
Albumin: 3.1 g/dL — ABNORMAL LOW (ref 3.5–5.0)
Alkaline Phosphatase: 67 U/L (ref 38–126)
Anion gap: 7 (ref 5–15)
BUN: 14 mg/dL (ref 8–23)
CO2: 27 mmol/L (ref 22–32)
Calcium: 9 mg/dL (ref 8.9–10.3)
Chloride: 110 mmol/L (ref 98–111)
Creatinine, Ser: 1.26 mg/dL — ABNORMAL HIGH (ref 0.44–1.00)
GFR, Estimated: 45 mL/min — ABNORMAL LOW (ref >60)
Glucose, Bld: 77 mg/dL (ref 70–99)
Potassium: 3.9 mmol/L (ref 3.5–5.1)
Sodium: 144 mmol/L (ref 135–145)
Total Bilirubin: 0.8 mg/dL (ref 0.3–1.2)
Total Protein: 5.4 g/dL — ABNORMAL LOW (ref 6.5–8.1)

## 2020-01-14 LAB — CBC WITH DIFFERENTIAL/PLATELET
Abs Immature Granulocytes: 0.02 10*3/uL (ref 0.00–0.07)
Basophils Absolute: 0 10*3/uL (ref 0.0–0.1)
Basophils Relative: 1 %
Eosinophils Absolute: 0.1 10*3/uL (ref 0.0–0.5)
Eosinophils Relative: 2 %
HCT: 42.2 % (ref 36.0–46.0)
Hemoglobin: 13.4 g/dL (ref 12.0–15.0)
Immature Granulocytes: 0 %
Lymphocytes Relative: 31 %
Lymphs Abs: 1.9 10*3/uL (ref 0.7–4.0)
MCH: 29.9 pg (ref 26.0–34.0)
MCHC: 31.8 g/dL (ref 30.0–36.0)
MCV: 94.2 fL (ref 80.0–100.0)
Monocytes Absolute: 0.5 10*3/uL (ref 0.1–1.0)
Monocytes Relative: 8 %
Neutro Abs: 3.5 10*3/uL (ref 1.7–7.7)
Neutrophils Relative %: 58 %
Platelets: 194 10*3/uL (ref 150–400)
RBC: 4.48 MIL/uL (ref 3.87–5.11)
RDW: 15.8 % — ABNORMAL HIGH (ref 11.5–15.5)
WBC: 6.1 10*3/uL (ref 4.0–10.5)
nRBC: 0 % (ref 0.0–0.2)

## 2020-01-14 LAB — PROTIME-INR
INR: 1 (ref 0.8–1.2)
Prothrombin Time: 12.6 seconds (ref 11.4–15.2)

## 2020-01-14 MED ORDER — FENTANYL CITRATE (PF) 100 MCG/2ML IJ SOLN
50.0000 ug | Freq: Once | INTRAMUSCULAR | Status: AC
  Administered 2020-01-14: 50 ug via INTRAVENOUS
  Filled 2020-01-14: qty 2

## 2020-01-14 MED ORDER — MIRABEGRON ER 25 MG PO TB24: 25.0000 mg | ORAL_TABLET | Freq: Every day | ORAL | 0 refills | Status: AC

## 2020-01-14 MED ORDER — SODIUM CHLORIDE 0.9 % IV BOLUS
1000.0000 mL | Freq: Once | INTRAVENOUS | Status: AC
  Administered 2020-01-14: 1000 mL via INTRAVENOUS

## 2020-01-14 MED ORDER — CYCLOBENZAPRINE HCL 5 MG PO TABS: 10.0000 mg | ORAL_TABLET | Freq: Two times a day (BID) | ORAL | 0 refills | Status: DC | PRN

## 2020-01-14 MED ORDER — IOHEXOL 300 MG/ML  SOLN
100.0000 mL | Freq: Once | INTRAMUSCULAR | Status: AC | PRN
  Administered 2020-01-14: 100 mL via INTRAVENOUS

## 2020-01-14 NOTE — ED Notes (Signed)
Pt assisted to bedside commode
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
## 2020-01-14 NOTE — ED Provider Notes (Signed)
[facility] EMERGENCY DEPARTMENT Provider Note   CSN: [id number] Arrival date & time: 01/14/20  1622     History Chief Complaint  Patient presents with   Muscle Pain   Fall    [name] is a 74 y.o. female.  The history is provided by the patient and medical records. No language interpreter was used.  Fall This is a new problem. The current episode started yesterday. The problem occurs constantly. The problem has not changed since onset.Associated symptoms include chest pain, abdominal pain and headaches. Pertinent negatives include no shortness of breath. Nothing aggravates the symptoms. Nothing relieves the symptoms. She has tried nothing for the symptoms. The treatment provided no relief.       Past Medical History:  Diagnosis Date   Arthritis    Bilateral chronic knee pain    Chronic back pain    Depression    Sinus bradycardia     Patient Active Problem List   Diagnosis Date Noted   Chronic back pain 08/07/2019   Depression 08/07/2019   Near syncope 08/06/2019   Sinus bradycardia 08/06/2019   Weakness 11/03/2012    Past Surgical History:  Procedure Laterality Date   ABDOMINAL HYSTERECTOMY  1995   back sugery     BUNIONECTOMY  2013   rt foot   COLONOSCOPY     MUSCLE BIOPSY Left 10/03/2012   Procedure: LEFT QUADRICEP MUSCLE BIOPSY;  Surgeon: Odis Hollingshead, MD;  Location: [facility];  Service: General;  Laterality: Left;   NECK SURGERY  2010   cerv disc fused      OB History   No obstetric history on file.     Family History  Problem Relation Age of Onset   Hypertension Mother    Cancer Sister     Social History   Tobacco Use   Smoking status: Former Smoker    Quit date: 03/23/1998    Years since quitting: 21.8   Smokeless tobacco: Never Used  Substance Use Topics   Alcohol use: No   Drug use: No    Home Medications Prior to Admission medications   Medication Sig Start Date  End Date Taking? Authorizing Provider  clonazePAM (KLONOPIN) 1 MG tablet Take 1 mg by mouth at bedtime.  06/29/19   [provider]  pantoprazole (PROTONIX) 40 MG tablet Take 1 tablet (40 mg total) by mouth 2 (two) times daily. 08/09/19   Black, Lezlie Octave, NP  traZODone (DESYREL) 50 MG tablet Take 50-150 mg by mouth at bedtime as needed for sleep.  06/29/19   [provider]  venlafaxine XR (EFFEXOR-XR) 75 MG 24 hr capsule Take 75 mg by mouth daily with breakfast.    [provider]    Allergies    Oxycontin [oxycodone] and Gadolinium derivatives  Review of Systems   Review of Systems  Constitutional: Negative for diaphoresis, fatigue and fever.  HENT: Negative for congestion.   Eyes: Negative for visual disturbance.  Respiratory: Negative for cough, chest tightness, shortness of breath and wheezing.   Cardiovascular: Positive for chest pain. Negative for palpitations and leg swelling.  Gastrointestinal: Positive for abdominal pain. Negative for diarrhea, nausea and vomiting.  Genitourinary: Negative for dysuria, flank pain and frequency.  Musculoskeletal: Positive for back pain and neck pain. Negative for neck stiffness.  Skin: Negative for wound.  Neurological: Positive for headaches. Negative for dizziness, seizures, light-headedness and numbness.  Psychiatric/Behavioral: Negative for agitation.  All other systems reviewed and are  negative.   Physical Exam Updated Vital Signs BP 122/61    Pulse 61    Temp 97.6 F (36.4 C) (Oral)    Resp 16    Ht 5\' 9"  (1.753 m)    Wt 101 kg    SpO2 100%    BMI 32.88 kg/m   Physical Exam Vitals and nursing note reviewed.  Constitutional:      General: She is not in acute distress.    Appearance: She is well-developed. She is not ill-appearing, toxic-appearing or diaphoretic.  HENT:     Head: Normocephalic.     Right Ear: External ear normal.     Left Ear: External ear normal.     Nose: Nose normal.     Mouth/Throat:      Mouth: Mucous membranes are moist.     Pharynx: No oropharyngeal exudate or posterior oropharyngeal erythema.  Eyes:     Extraocular Movements: Extraocular movements intact.     Conjunctiva/sclera: Conjunctivae normal.     Pupils: Pupils are equal, round, and reactive to light.  Cardiovascular:     Rate and Rhythm: Normal rate.     Pulses: Normal pulses.     Heart sounds: No murmur heard.   Pulmonary:     Effort: Pulmonary effort is normal. No respiratory distress.     Breath sounds: No stridor. No wheezing, rhonchi or rales.  Chest:     Chest wall: Tenderness present.  Abdominal:     General: Abdomen is flat. There is no distension.     Tenderness: There is abdominal tenderness. There is no right CVA tenderness, left CVA tenderness, guarding or rebound.    Musculoskeletal:        General: Swelling and tenderness present.       Arms:     Cervical back: Normal range of motion and neck supple. Tenderness present.     Lumbar back: Tenderness present.       Back:       Legs:  Skin:    General: Skin is warm.     Capillary Refill: Capillary refill takes less than 2 seconds.     Coloration: Skin is not pale.     Findings: No erythema or rash.  Neurological:     General: No focal deficit present.     Mental Status: She is alert and oriented to person, place, and time.     Sensory: No sensory deficit.     Motor: No weakness or abnormal muscle tone.     Deep Tendon Reflexes: Reflexes are normal and symmetric.  Psychiatric:        Mood and Affect: Mood normal.     ED Results / Procedures / Treatments   Labs (all labs ordered are listed, but only abnormal results are displayed) Labs Reviewed  CBC WITH DIFFERENTIAL/PLATELET - Abnormal; Notable for the following components:      Result Value   RDW 15.8 (*)    All other components within normal limits  COMPREHENSIVE METABOLIC PANEL - Abnormal; Notable for the following components:   Creatinine, Ser 1.26 (*)    Total Protein  5.4 (*)    Albumin 3.1 (*)    GFR, Estimated 45 (*)    All other components within normal limits  PROTIME-INR    EKG None  Radiology DG Elbow Complete Right  Result Date: 01/14/2020 CLINICAL DATA:  Status post fall. EXAM: RIGHT ELBOW - COMPLETE 3+ VIEW COMPARISON:  None. FINDINGS: There is no evidence of an acute  fracture, dislocation, or joint effusion. There is no evidence of arthropathy or other focal bone abnormality. A 4.5 mm soft tissue calcification is seen along the dorsal aspect of the right elbow. IMPRESSION: No acute osseous abnormality. Electronically Signed   By: Virgina Norfolk M.D.   On: 01/14/2020 18:38   DG Ankle Complete Right  Result Date: 01/14/2020 CLINICAL DATA:  Status post fall. EXAM: RIGHT ANKLE - COMPLETE 3+ VIEW COMPARISON:  None. FINDINGS: There is no evidence of fracture, dislocation, or joint effusion. There is no evidence of arthropathy or other focal bone abnormality. There is mild diffuse soft tissue swelling. IMPRESSION: 1. No acute osseous abnormality. 2. Mild diffuse soft tissue swelling. Electronically Signed   By: Virgina Norfolk M.D.   On: 01/14/2020 18:43   CT Head Wo Contrast  Result Date: 01/14/2020 CLINICAL DATA:  Status post fall. EXAM: CT HEAD WITHOUT CONTRAST TECHNIQUE: Contiguous axial images were obtained from the base of the skull through the vertex without intravenous contrast. COMPARISON:  Aug 08, 2019 FINDINGS: Brain: There is mild cerebral atrophy with widening of the extra-axial spaces and ventricular dilatation. There are areas of decreased attenuation within the white matter tracts of the supratentorial brain, consistent with microvascular disease changes. Vascular: No hyperdense vessel or unexpected calcification. Skull: Normal. Negative for fracture or focal lesion. Sinuses/Orbits: No acute finding. Other: None. IMPRESSION: 1. Mild cerebral atrophy and microvascular disease changes of the supratentorial brain. 2. No acute  intracranial abnormality. Electronically Signed   By: Virgina Norfolk M.D.   On: 01/14/2020 20:29   CT Cervical Spine Wo Contrast  Result Date: 01/14/2020 CLINICAL DATA:  Status post fall. EXAM: CT CERVICAL SPINE WITHOUT CONTRAST TECHNIQUE: Multidetector CT imaging of the cervical spine was performed without intravenous contrast. Multiplanar CT image reconstructions were also generated. COMPARISON:  July 17, 2013 FINDINGS: Alignment: Normal. Skull base and vertebrae: No acute fracture. A metallic density fusion plate and screws are seen along the anterior aspect of the C5 and C7 vertebral bodies. Bilateral metallic density pedicle screws are seen from the levels of C3 through T2. Diffuse streak artifact is seen with subsequently limited evaluation of the adjacent osseous and soft tissue structures. Soft tissues and spinal canal: No prevertebral fluid or swelling. No visible canal hematoma. Disc levels: Previously noted postoperative changes seen throughout the cervical spine with moderate severity anterior osteophyte formation also seen at the levels of C3-C4-C4-C5. Moderate severity endplate sclerosis is seen at the level of C7-T1. Upper chest: Negative. Other: None. IMPRESSION: 1. Status post anterior cervical spine fusion from the level of C5 through C7. 2. Stable postoperative changes consistent with prior posterior cervical fusion from the level of C3 through T2. 3. Multilevel degenerative changes without an acute osseous abnormality. Electronically Signed   By: Virgina Norfolk M.D.   On: 01/14/2020 20:35   CT CHEST ABDOMEN PELVIS W CONTRAST  Result Date: 01/14/2020 CLINICAL DATA:  Acute pain due to trauma. EXAM: CT CHEST, ABDOMEN AND PELVIS WITHOUT CONTRAST CT LUMBAR SPINE WITHOUT CONTRAST TECHNIQUE: Multidetector CT imaging of the chest, abdomen and pelvis was performed following the standard protocol without IV contrast. Multiplanar CT images of the thoracic and lumbar spine were reconstructed  from contemporary CT of the Chest, Abdomen, and Pelvis COMPARISON:  CT dated 11/14/2012 FINDINGS: CHEST Cardiovascular: There are atherosclerotic changes of the thoracic aorta without evidence for an aneurysm or dissection. The heart size is mildly enlarged. No large pulmonary embolism. There is no significant pericardial effusion. Mediastinum/Nodes: -- No mediastinal  lymphadenopathy. -- No hilar lymphadenopathy. -- No axillary lymphadenopathy. -- No supraclavicular lymphadenopathy. --the thyroid gland is diffusely enlarged and heterogeneous. This was previously evaluated by ultrasound. -  Unremarkable esophagus. Lungs/Pleura: Airways are patent. No pleural effusion, lobar consolidation, pneumothorax or pulmonary infarction. Musculoskeletal: No chest wall abnormality. No bony spinal canal stenosis. ABDOMEN AND PELVIS Hepatobiliary: The liver is normal. Normal gallbladder.There is no biliary ductal dilation. Pancreas: Normal contours without ductal dilatation. No peripancreatic fluid collection. Spleen: Unremarkable. Adrenals/Urinary Tract: --Adrenal glands: Unremarkable. --Right kidney/ureter: No hydronephrosis or radiopaque kidney stones. --Left kidney/ureter: No hydronephrosis or radiopaque kidney stones. --Urinary bladder: Unremarkable. Stomach/Bowel: --Stomach/Duodenum: No hiatal hernia or other gastric abnormality. Normal duodenal course and caliber. --Small bowel: Unremarkable. --Colon: Unremarkable. --Appendix: Not visualized. No right lower quadrant inflammation or free fluid. Vascular/Lymphatic: Atherosclerotic calcification is present within the non-aneurysmal abdominal aorta, without hemodynamically significant stenosis. --No retroperitoneal lymphadenopathy. --No mesenteric lymphadenopathy. --No pelvic or inguinal lymphadenopathy. Reproductive: Patient is status post prior hysterectomy. There is a small cystic lesion involving the right ovary measuring 2.3 cm. No further imaging is recommended. Other: No  ascites or free air. The abdominal wall is normal. Musculoskeletal. Patient is status post prior lumbar fusion. The hardware appears intact. There is no acute compression fracture. IMPRESSION: 1. No CT evidence for acute thoracic, abdominal or pelvic injury. 2. No acute fracture involving the thoracic or lumbar spine. 3. Chronic findings as detailed above. Aortic Atherosclerosis (ICD10-I70.0). Electronically Signed   By: Constance Holster M.D.   On: 01/14/2020 20:41   CT L-SPINE NO CHARGE  Result Date: 01/14/2020 CLINICAL DATA:  Acute pain due to trauma. EXAM: CT CHEST, ABDOMEN AND PELVIS WITHOUT CONTRAST CT LUMBAR SPINE WITHOUT CONTRAST TECHNIQUE: Multidetector CT imaging of the chest, abdomen and pelvis was performed following the standard protocol without IV contrast. Multiplanar CT images of the thoracic and lumbar spine were reconstructed from contemporary CT of the Chest, Abdomen, and Pelvis COMPARISON:  CT dated 11/14/2012 FINDINGS: CHEST Cardiovascular: There are atherosclerotic changes of the thoracic aorta without evidence for an aneurysm or dissection. The heart size is mildly enlarged. No large pulmonary embolism. There is no significant pericardial effusion. Mediastinum/Nodes: -- No mediastinal lymphadenopathy. -- No hilar lymphadenopathy. -- No axillary lymphadenopathy. -- No supraclavicular lymphadenopathy. --the thyroid gland is diffusely enlarged and heterogeneous. This was previously evaluated by ultrasound. -  Unremarkable esophagus. Lungs/Pleura: Airways are patent. No pleural effusion, lobar consolidation, pneumothorax or pulmonary infarction. Musculoskeletal: No chest wall abnormality. No bony spinal canal stenosis. ABDOMEN AND PELVIS Hepatobiliary: The liver is normal. Normal gallbladder.There is no biliary ductal dilation. Pancreas: Normal contours without ductal dilatation. No peripancreatic fluid collection. Spleen: Unremarkable. Adrenals/Urinary Tract: --Adrenal glands: Unremarkable.  --Right kidney/ureter: No hydronephrosis or radiopaque kidney stones. --Left kidney/ureter: No hydronephrosis or radiopaque kidney stones. --Urinary bladder: Unremarkable. Stomach/Bowel: --Stomach/Duodenum: No hiatal hernia or other gastric abnormality. Normal duodenal course and caliber. --Small bowel: Unremarkable. --Colon: Unremarkable. --Appendix: Not visualized. No right lower quadrant inflammation or free fluid. Vascular/Lymphatic: Atherosclerotic calcification is present within the non-aneurysmal abdominal aorta, without hemodynamically significant stenosis. --No retroperitoneal lymphadenopathy. --No mesenteric lymphadenopathy. --No pelvic or inguinal lymphadenopathy. Reproductive: Patient is status post prior hysterectomy. There is a small cystic lesion involving the right ovary measuring 2.3 cm. No further imaging is recommended. Other: No ascites or free air. The abdominal wall is normal. Musculoskeletal. Patient is status post prior lumbar fusion. The hardware appears intact. There is no acute compression fracture. IMPRESSION: 1. No CT evidence for acute thoracic, abdominal or pelvic injury.  2. No acute fracture involving the thoracic or lumbar spine. 3. Chronic findings as detailed above. Aortic Atherosclerosis (ICD10-I70.0). Electronically Signed   By: Constance Holster M.D.   On: 01/14/2020 20:41   DG Knee Complete 4 Views Right  Result Date: 01/14/2020 CLINICAL DATA:  Status post fall. EXAM: RIGHT KNEE - COMPLETE 4+ VIEW COMPARISON:  None. FINDINGS: No evidence of acute fracture or dislocation. Moderate to marked severity medial and lateral tibiofemoral compartment space narrowing is seen. There is a moderate to large joint effusion. IMPRESSION: 1. No acute fracture or dislocation. 2. Moderate to marked severity degenerative changes with a moderate to large joint effusion. Electronically Signed   By: Virgina Norfolk M.D.   On: 01/14/2020 18:42   DG Foot Complete Right  Result Date:  01/14/2020 CLINICAL DATA:  Status post fall. EXAM: RIGHT FOOT COMPLETE - 3+ VIEW COMPARISON:  None. FINDINGS: There is no evidence of an acute fracture or dislocation. A radiopaque surgical suture is seen along the base of the proximal phalanx of the right great toe. Marked severity degenerative changes are seen involving the metatarsophalangeal articulation of the right great toe. Soft tissues are unremarkable. IMPRESSION: 1. No acute fracture or dislocation. 2. Marked severity degenerative changes involving the metatarsophalangeal articulation of the right great toe. Electronically Signed   By: Virgina Norfolk M.D.   On: 01/14/2020 18:44    Procedures Procedures (including critical care time)  Medications Ordered in ED Medications  fentaNYL (SUBLIMAZE) injection 50 mcg (50 mcg Intravenous Given 01/14/20 1808)  sodium chloride 0.9 % bolus 1,000 mL (0 mLs Intravenous Stopped 01/14/20 1900)  iohexol (OMNIPAQUE) 300 MG/ML solution 100 mL (100 mLs Intravenous Contrast Given 01/14/20 1936)    ED Course  I have reviewed the triage vital signs and the nursing notes.  Pertinent labs & imaging results that were available during my care of the patient were reviewed by me and considered in my medical decision making (see chart for details).    MDM Rules/Calculators/A&P                          JAYLIA PETTUS is a 74 y.o. female with a past medical history significant for prior neck surgery and low back surgery, depression, chronic knee pain, and arthritis who presents with a fall.  She reports that she was getting out of car last night when she had a mechanical fall landing on her right side.  She reports that she does not remember some of the fall and think she may have gotten knocked out.  She has mild right lower headache, right neck pain, mid back pain, low back pain, right hip pain, right elbow pain, right knee pain and swelling, right ankle pain, and right foot pain.  She reports the pain is  severe.  She reports has had difficulty ambulating today due to the pain.  She reports no left-sided symptoms.  She denies shortness of breath.  She is right-handed.  She does not report any vision changes, loss of bowel or bladder control, nausea or vomiting, or new numbness, tingling, or weakness.  She always has some right leg weakness at baseline due to prior surgery.  On exam, patient did have tenderness in all the locations described.  She had pain in her right neck, her upper back, low back, right chest wall, right abdomen, right hip, right knee with swelling, and right ankle and foot.  She did have swelling inferior to the  right knee.  She had good grip strength, sensation, and pulses in upper extremities.  Good pulses lower extremities.  Tenderness in the right elbow with movement.  No snuffbox or wrist tenderness.  No shoulder tenderness on exam.  Symmetric smile.  Pupils are reactive with normal extraocular movements.  Lungs were clear and symmetric.  Due to her severe pain and this fall onto concrete, we will get imaging including head, neck, chest/abdomen/pelvis.  We will get images of the right elbow, right knee, and right foot/ankle.  She will given pain medicine has some screening labs.  Anticipate reassessment after work-up.  Patient's diagnostic work-up was overall reassuring.  Her itching and x-rays did not show any acute fractures or dislocations.  I do suspect she has a soft tissue or ligamentous injury in the right knee given the swelling and pain.  She will be placed in knee immobilizer.  We discussed with son about management of the ankle as it is likely a sprain and will use an ASO.  She will use crutches.  Due to the muscle spasms on exam, we will give the patient prescription for muscle relaxant and he reports she is moving to [place].  She will follow-up with orthopedics for further evaluation management of the ankle and knee injury.  Patient did not want to wait for urinalysis  as she had no urinary symptoms.  Other labs were similar to prior or reassuring.  Patient agreed with plan of care and will be discharged with her son.  She understands return precautions and follow-up instructions.  She had no other questions or concerns and was discharged in good condition after work-up for her fall.  Patient also requested a prescription for her urine medication as she is out of it.  She reports she chronically has some difficulty holding her urine at night.  This will be refilled as she has not yet established with a new PCP.   Final Clinical Impression(s) / ED Diagnoses Final diagnoses:  Fall  Acute pain of right knee  Acute right ankle pain  Muscle spasm    Rx / DC Orders ED Discharge Orders         Ordered    cyclobenzaprine (FLEXERIL) 5 MG tablet  2 times daily PRN        01/14/20 2315    mirabegron ER (MYRBETRIQ) 25 MG TB24 tablet  Daily        01/14/20 2315          Clinical Impression: 1. Acute pain of right knee   2. Fall   3. Acute right ankle pain   4. Muscle spasm     Disposition: Discharge  Condition: Good  I have discussed the results, Dx and Tx plan with the pt(& family if present). He/she/they expressed understanding and agree(s) with the plan. Discharge instructions discussed at great length. Strict return precautions discussed and pt &/or family have verbalized understanding of the instructions. No further questions at time of discharge.    New Prescriptions   CYCLOBENZAPRINE (FLEXERIL) 5 MG TABLET    Take 2 tablets (10 mg total) by mouth 2 (two) times daily as needed for muscle spasms.   MIRABEGRON ER (MYRBETRIQ) 25 MG TB24 TABLET    Take 1 tablet (25 mg total) by mouth daily.    Follow Up: Merrilee Seashore, MD [place] [address] [phone]     [facility] [place] [address] [id number] [place]  [place], [place] [address] [place] [address] [phone]        Selenia Mihok, Gwenyth Allegra, MD 01/14/20 [phone]

## 2020-01-14 NOTE — Discharge Instructions (Signed)
Your imaging today after your fall did not show any acute traumatic injuries however I am concerned based on physical exam the move injured the soft tissues or ligaments in your right knee.  Please use the knee immobilizer and crutches to help support you.  Please use the ASO brace for her ankle which I suspect you sprained as there were no bony injury seen.  You also had muscle spasms, please use the muscle relaxant.  I filled your home medication that you were out of as well.  Please follow-up with the orthopedics team and PCP.  Please rest and stay hydrated.  If any symptoms change or worsen, please return to the nearest emergency department.

## 2020-01-15 DIAGNOSIS — M25561 Pain in right knee: Secondary | ICD-10-CM | POA: Diagnosis not present

## 2020-01-15 NOTE — Progress Notes (Signed)
Orthopedic Tech Progress Note Patient Details:  [name] Jul 31, 1945 [id number]  Ortho Devices Type of Ortho Device: Knee Immobilizer, ASO Ortho Device/Splint Location: rle Ortho Device/Splint Interventions: Ordered, Application, Adjustment   Post Interventions Patient Tolerated: Well Instructions Provided: Care of device, Adjustment of device   Karolee Stamps 01/15/2020, 12:08 AM

## 2020-01-16 ENCOUNTER — Encounter: Payer: Medicare Other | Admitting: Physical Therapy

## 2020-01-23 ENCOUNTER — Encounter: Payer: Medicare Other | Admitting: Physical Therapy

## 2020-01-23 DIAGNOSIS — M1180 Other specified crystal arthropathies, unspecified site: Secondary | ICD-10-CM | POA: Diagnosis not present

## 2020-01-23 DIAGNOSIS — M25561 Pain in right knee: Secondary | ICD-10-CM | POA: Diagnosis not present

## 2020-01-23 DIAGNOSIS — R768 Other specified abnormal immunological findings in serum: Secondary | ICD-10-CM | POA: Diagnosis not present

## 2020-01-23 DIAGNOSIS — M25562 Pain in left knee: Secondary | ICD-10-CM | POA: Diagnosis not present

## 2020-01-23 DIAGNOSIS — M170 Bilateral primary osteoarthritis of knee: Secondary | ICD-10-CM | POA: Diagnosis not present

## 2020-01-25 ENCOUNTER — Encounter: Payer: Medicare Other | Admitting: Physical Therapy

## 2020-01-30 ENCOUNTER — Encounter: Payer: Medicare Other | Admitting: Physical Therapy

## 2020-02-01 ENCOUNTER — Encounter: Payer: Medicare Other | Admitting: Physical Therapy

## 2020-02-01 DIAGNOSIS — Z23 Encounter for immunization: Secondary | ICD-10-CM | POA: Diagnosis not present

## 2020-02-06 ENCOUNTER — Encounter: Payer: Medicare Other | Admitting: Physical Therapy

## 2020-02-08 ENCOUNTER — Encounter: Payer: Medicare Other | Admitting: Physical Therapy

## 2020-02-12 ENCOUNTER — Encounter: Payer: Medicare Other | Admitting: Physical Therapy

## 2020-02-13 ENCOUNTER — Encounter: Payer: Medicare Other | Admitting: Physical Therapy

## 2020-02-14 ENCOUNTER — Encounter: Payer: Medicare Other | Admitting: Physical Therapy

## 2020-02-14 DIAGNOSIS — R269 Unspecified abnormalities of gait and mobility: Secondary | ICD-10-CM | POA: Diagnosis not present

## 2020-02-14 DIAGNOSIS — M4326 Fusion of spine, lumbar region: Secondary | ICD-10-CM | POA: Diagnosis not present

## 2020-02-14 DIAGNOSIS — M6281 Muscle weakness (generalized): Secondary | ICD-10-CM | POA: Diagnosis not present

## 2020-02-14 DIAGNOSIS — M546 Pain in thoracic spine: Secondary | ICD-10-CM | POA: Diagnosis not present

## 2020-02-19 ENCOUNTER — Encounter: Payer: Medicare Other | Admitting: Physical Therapy

## 2020-02-22 ENCOUNTER — Encounter: Payer: Medicare Other | Admitting: Physical Therapy

## 2020-02-29 ENCOUNTER — Encounter (HOSPITAL_COMMUNITY): Payer: Self-pay

## 2020-02-29 ENCOUNTER — Emergency Department (HOSPITAL_COMMUNITY)
Admission: EM | Admit: 2020-02-29 | Discharge: 2020-02-29 | Disposition: A | Discharge status: Home / Self Care | Payer: No Typology Code available for payment source | Attending: Emergency Medicine | Admitting: Emergency Medicine

## 2020-02-29 ENCOUNTER — Emergency Department (HOSPITAL_COMMUNITY): Payer: No Typology Code available for payment source

## 2020-02-29 ENCOUNTER — Other Ambulatory Visit: Payer: Self-pay

## 2020-02-29 DIAGNOSIS — M549 Dorsalgia, unspecified: Secondary | ICD-10-CM | POA: Diagnosis not present

## 2020-02-29 DIAGNOSIS — M25562 Pain in left knee: Secondary | ICD-10-CM | POA: Diagnosis not present

## 2020-02-29 DIAGNOSIS — I10 Essential (primary) hypertension: Secondary | ICD-10-CM | POA: Diagnosis not present

## 2020-02-29 DIAGNOSIS — M25512 Pain in left shoulder: Secondary | ICD-10-CM | POA: Diagnosis not present

## 2020-02-29 DIAGNOSIS — M25511 Pain in right shoulder: Secondary | ICD-10-CM | POA: Diagnosis not present

## 2020-02-29 DIAGNOSIS — Z87891 Personal history of nicotine dependence: Secondary | ICD-10-CM | POA: Insufficient documentation

## 2020-02-29 DIAGNOSIS — M25561 Pain in right knee: Secondary | ICD-10-CM | POA: Diagnosis not present

## 2020-02-29 DIAGNOSIS — M25462 Effusion, left knee: Secondary | ICD-10-CM | POA: Diagnosis not present

## 2020-02-29 DIAGNOSIS — M542 Cervicalgia: Secondary | ICD-10-CM | POA: Diagnosis not present

## 2020-02-29 DIAGNOSIS — Z041 Encounter for examination and observation following transport accident: Secondary | ICD-10-CM | POA: Diagnosis not present

## 2020-02-29 DIAGNOSIS — R52 Pain, unspecified: Secondary | ICD-10-CM | POA: Diagnosis not present

## 2020-02-29 MED ORDER — CYCLOBENZAPRINE HCL 5 MG PO TABS: 10.0000 mg | ORAL_TABLET | Freq: Two times a day (BID) | ORAL | 0 refills | Status: AC | PRN

## 2020-02-29 MED ORDER — ACETAMINOPHEN 500 MG PO TABS
1000.0000 mg | ORAL_TABLET | Freq: Once | ORAL | Status: AC
  Administered 2020-02-29: 1000 mg via ORAL
  Filled 2020-02-29: qty 2

## 2020-02-29 NOTE — ED Notes (Signed)
An After Visit Summary was printed and given to the patient. Discharge instructions given and no further questions at this time.  Pt leaving with husband. 
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
## 2020-03-18 ENCOUNTER — Telehealth: Payer: Self-pay | Admitting: *Deleted

## 2020-03-18 ENCOUNTER — Encounter: Payer: Self-pay | Admitting: *Deleted

## 2020-03-18 ENCOUNTER — Ambulatory Visit: Payer: Medicare Other | Admitting: Diagnostic Neuroimaging

## 2020-03-18 ENCOUNTER — Encounter: Payer: Self-pay | Admitting: Diagnostic Neuroimaging

## 2020-03-18 NOTE — Telephone Encounter (Signed)
Patient was no show for new patient appointment today. 

## 2020-03-20 DIAGNOSIS — M79672 Pain in left foot: Secondary | ICD-10-CM | POA: Diagnosis not present

## 2020-03-20 DIAGNOSIS — M79641 Pain in right hand: Secondary | ICD-10-CM | POA: Diagnosis not present

## 2020-03-20 DIAGNOSIS — E669 Obesity, unspecified: Secondary | ICD-10-CM | POA: Diagnosis not present

## 2020-03-20 DIAGNOSIS — R29898 Other symptoms and signs involving the musculoskeletal system: Secondary | ICD-10-CM | POA: Diagnosis not present

## 2020-03-20 DIAGNOSIS — M1120 Other chondrocalcinosis, unspecified site: Secondary | ICD-10-CM | POA: Diagnosis not present

## 2020-03-20 DIAGNOSIS — M170 Bilateral primary osteoarthritis of knee: Secondary | ICD-10-CM | POA: Diagnosis not present

## 2020-03-20 DIAGNOSIS — M79671 Pain in right foot: Secondary | ICD-10-CM | POA: Diagnosis not present

## 2020-03-20 DIAGNOSIS — M79642 Pain in left hand: Secondary | ICD-10-CM | POA: Diagnosis not present

## 2020-03-20 DIAGNOSIS — Z6833 Body mass index (BMI) 33.0-33.9, adult: Secondary | ICD-10-CM | POA: Diagnosis not present

## 2020-04-02 ENCOUNTER — Ambulatory Visit: Payer: Medicare Other | Admitting: Physical Therapy

## 2020-04-02 ENCOUNTER — Telehealth: Payer: Self-pay | Admitting: Physical Therapy

## 2020-04-02 NOTE — Telephone Encounter (Signed)
Called patient after she did not show up for her 3:15 evaluation today. Patient answered and stated she had it down wrong on her calendar as tomorrow. Rescheduled with patient to 1:45pm tomorrow (04/04/2019).   Everlean Alstrom. Graylon Good, PT, DPT 04/02/20, 4:00 PM

## 2020-04-03 ENCOUNTER — Ambulatory Visit: Payer: Medicare Other | Attending: Physician Assistant | Admitting: Physical Therapy

## 2020-04-03 ENCOUNTER — Encounter: Payer: Self-pay | Admitting: Physical Therapy

## 2020-04-03 ENCOUNTER — Other Ambulatory Visit: Payer: Self-pay

## 2020-04-03 DIAGNOSIS — M545 Low back pain, unspecified: Secondary | ICD-10-CM | POA: Diagnosis not present

## 2020-04-03 DIAGNOSIS — R29898 Other symptoms and signs involving the musculoskeletal system: Secondary | ICD-10-CM | POA: Diagnosis not present

## 2020-04-03 DIAGNOSIS — R262 Difficulty in walking, not elsewhere classified: Secondary | ICD-10-CM

## 2020-04-03 DIAGNOSIS — M6281 Muscle weakness (generalized): Secondary | ICD-10-CM | POA: Diagnosis not present

## 2020-04-03 DIAGNOSIS — M25562 Pain in left knee: Secondary | ICD-10-CM

## 2020-04-03 DIAGNOSIS — R2681 Unsteadiness on feet: Secondary | ICD-10-CM | POA: Insufficient documentation

## 2020-04-03 DIAGNOSIS — M25561 Pain in right knee: Secondary | ICD-10-CM | POA: Insufficient documentation
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
## 2020-04-05 DIAGNOSIS — M9982 Other biomechanical lesions of thoracic region: Secondary | ICD-10-CM | POA: Diagnosis not present

## 2020-04-05 DIAGNOSIS — M4805 Spinal stenosis, thoracolumbar region: Secondary | ICD-10-CM | POA: Diagnosis not present

## 2020-04-05 DIAGNOSIS — M48061 Spinal stenosis, lumbar region without neurogenic claudication: Secondary | ICD-10-CM | POA: Diagnosis not present

## 2020-04-05 DIAGNOSIS — M5416 Radiculopathy, lumbar region: Secondary | ICD-10-CM | POA: Diagnosis not present

## 2020-04-05 DIAGNOSIS — M4326 Fusion of spine, lumbar region: Secondary | ICD-10-CM | POA: Diagnosis not present

## 2020-04-05 DIAGNOSIS — M5124 Other intervertebral disc displacement, thoracic region: Secondary | ICD-10-CM | POA: Diagnosis not present

## 2020-04-05 DIAGNOSIS — R269 Unspecified abnormalities of gait and mobility: Secondary | ICD-10-CM | POA: Diagnosis not present

## 2020-04-05 DIAGNOSIS — M546 Pain in thoracic spine: Secondary | ICD-10-CM | POA: Diagnosis not present

## 2020-04-05 DIAGNOSIS — M9983 Other biomechanical lesions of lumbar region: Secondary | ICD-10-CM | POA: Diagnosis not present

## 2020-04-05 DIAGNOSIS — Z981 Arthrodesis status: Secondary | ICD-10-CM | POA: Diagnosis not present

## 2020-04-05 DIAGNOSIS — Z87891 Personal history of nicotine dependence: Secondary | ICD-10-CM | POA: Diagnosis not present

## 2020-04-05 DIAGNOSIS — M6281 Muscle weakness (generalized): Secondary | ICD-10-CM | POA: Diagnosis not present

## 2020-04-05 DIAGNOSIS — M5126 Other intervertebral disc displacement, lumbar region: Secondary | ICD-10-CM | POA: Diagnosis not present

## 2020-04-09 ENCOUNTER — Ambulatory Visit: Payer: Medicare Other | Admitting: Physical Therapy

## 2020-04-11 ENCOUNTER — Ambulatory Visit: Payer: Medicare Other

## 2020-04-11 ENCOUNTER — Other Ambulatory Visit: Payer: Self-pay

## 2020-04-11 DIAGNOSIS — M25561 Pain in right knee: Secondary | ICD-10-CM | POA: Diagnosis not present

## 2020-04-11 DIAGNOSIS — M6281 Muscle weakness (generalized): Secondary | ICD-10-CM | POA: Diagnosis not present

## 2020-04-11 DIAGNOSIS — R29898 Other symptoms and signs involving the musculoskeletal system: Secondary | ICD-10-CM | POA: Diagnosis not present

## 2020-04-11 DIAGNOSIS — R262 Difficulty in walking, not elsewhere classified: Secondary | ICD-10-CM | POA: Diagnosis not present

## 2020-04-11 DIAGNOSIS — M545 Low back pain, unspecified: Secondary | ICD-10-CM | POA: Diagnosis not present

## 2020-04-11 DIAGNOSIS — R2681 Unsteadiness on feet: Secondary | ICD-10-CM | POA: Diagnosis not present

## 2020-04-11 NOTE — Therapy (Signed)
[facility] [address] [place], [address] Phone: [phone]   Fax:  [phone]  Physical Therapy Treatment  Patient Details  Name: [name] MRN: [id number] Date of Birth: [birth year] Referring Provider (PT): Erline Hau, [place]   Encounter Date: 04/11/2020   PT End of Session - 04/11/20 1444    Visit Number 2    Number of Visits 24    Date for PT Re-Evaluation 06/26/20    Authorization Type Medicare reporting period from 04/03/2020    PT Start Time 9735    PT Stop Time 1517    PT Time Calculation (min) 38 min    Equipment Utilized During Treatment Gait belt    Activity Tolerance Patient tolerated treatment well;No increased pain    Behavior During Therapy WFL for tasks assessed/performed           Past Medical History:  Diagnosis Date  . Anxiety   . Arthritis   . Bilateral chronic knee pain   . Chronic back pain   . Depression   . GERD (gastroesophageal reflux disease)   . Insomnia   . Memory changes   . Sinus bradycardia   . Urge incontinence     Past Surgical History:  Procedure Laterality Date  . ABDOMINAL HYSTERECTOMY  1995  . back sugery     lumbar  . BIOPSY THYROID     benign goiter  . BUNIONECTOMY  2013   rt foot  . COLONOSCOPY    . MUSCLE BIOPSY Left 10/03/2012   Procedure: LEFT QUADRICEP MUSCLE BIOPSY;  Surgeon: Odis Hollingshead, MD;  Location: [place] [facility];  Service: General;  Laterality: Left;  . NECK SURGERY  2010   cerv disc fused     There were no vitals filed for this visit.   Subjective Assessment - 04/11/20 1443    Subjective Pt back for 2nd visit, reports feeling stiff all over, her right knee has been more painful. She denies any falls or updates since prior PT session.    Pertinent History Patient is a 75 y.o. female who presents to outpatient physical therapy with a referral for medical diagnosis thoracic spine pain, lumbar spine fusion,  generalized weakness. This patient's chief complaints consist of weakness and difficulty moving R leg leading to the following functional deficits: increased difficulty with daily tasks and mobility including walking, stairs, getting in and out of the car, grocery shopping, general mobility, fear of falling.. She ambulates with a single point cane and carbon fiber AFO on the R ankle.  Relevant past medical history and comorbidities include anxiety, bilateral chronic knee pain, arthritis, chronic back pain, GERD, memory changes, sinus bradycardia, urge incontinence, R foot bunionectomy (2013), former smoker.   Patient denies hx of cancer, stroke, seizures, lung problem, major cardiac events, diabetes, unexplained weight loss, changes in bowel or bladder problems, new onset stumbling or dropping things.    Limitations Lifting;Standing;Walking;House hold activities    Diagnostic tests Chest/Lumbar CT report from 01/14/2020: " IMPRESSION:  1. No CT evidence for acute thoracic, abdominal or pelvic injury.  2. No acute fracture involving the thoracic or lumbar spine.  3. Chronic findings as detailed above."    Patient Stated Goals "to get as much out of it as I can to move better"    Currently in Pain? No/denies           INTERVENTION: -AA/ROM and modersate cardiovascular loading on NuStep: 5 minutes Level  1, 2 minutes Level 3;  Seat 11, arms 11;  -STS from chair + airex pad 1x8 (increased Right knee pain, inconsistent assist with BUE, unable to standardize intensity) -STS from chair + 2 airex pads 2x5 hands free, 1x10   -Seated LAQ from chair 2x15 left, 2x10 right with minA from author  -Seated Red TB clam 2x10, ball between feet  -RDL to floor(+yoga block) with overhead press 2x15 (yellow medication ball) -Standing high knee marching, alternating sides, BUE support 2x20 (first set with red TB assist on RLE, then blue TB assist), no issues with Rt knee buckling during RLE stance phase   PT Short Term  Goals - 04/03/20 2007      PT SHORT TERM GOAL #1   Title Be independent with initial home exercise program for self-management of symptoms.    Baseline To be initiated at visit 2 as appropriate (04/03/2020);    Time 2    Period Weeks    Status New    Target Date 04/17/20             PT Long Term Goals - 04/03/20 1602      PT LONG TERM GOAL #1   Title Be independent with a long-term home exercise program for self-management of symptoms.    Baseline to be updated at visit 2 as appropriate (04/03/2020);    Time 12    Period Weeks    Status New   TARGET DATE FOR ALL LONG TERM GOALS: 06/26/2020     PT LONG TERM GOAL #2   Title Demonstrate improved FOTO score to equal or greater than 56 by visit #20 demonstrate improvement in overall condition and self-reported functional ability.    Baseline 48 (04/03/2020);    Time 12    Period Weeks    Status New      PT LONG TERM GOAL #3   Title Patient will improve her 10 MWT speed with her SPC to at least 1.2 m/s to promote better community ambulation and safe crossing of streets.    Baseline to be measured visit 2 (04/03/2020);    Time 12    Period Weeks    Status New      PT LONG TERM GOAL #4   Title Patient will improve ABC score by 13 percentage points to demonstrate improved self-reported balance.    Baseline 23.8% (04/03/2020);    Time 12    Period Weeks    Status New      PT LONG TERM GOAL #5   Title Patient will complete 5 Times Sit to Stand test from chair height without UE support in equal or less than 14 seconds to improve B LE power and strength for transfers and improved mobility, and demonstrate decreased fall risk (threshold between 12 and 15 for increased fall risk).    Baseline 28 seconds with great difficulty from 18.5 inch plinth with B UE support on mat and knees. Very unstable (04/03/2020);    Time 12    Period Weeks    Status New      Additional Long Term Goals   Additional Long Term Goals Yes      PT LONG TERM  GOAL #6   Title Pateint will improve 6 Minute Walk Test distance to equal or greater than 1000 feet with LRAD to demonstrate improved activity tolerance and endurance for community mobility and participation.    Baseline to be tested visit 2 (04/04/2019);    Time 12  Period Weeks    Status New                 Plan - 04/11/20 1445    Clinical Impression Statement Continued with current plan of care as laid out in evaluation and recent prior sessions. Pt remains motivated to advance progress toward goals. Rest breaks provided as needed, Pryor Curia assists with onset of RLE neurological fatigue. Pt does require varying levels of assistance and cuing for completion of exercises for correct form and sometimes due to pain/weakness. Pt continues to demonstrate progress toward goals AEB progression of some interventions this date either in volume or intensity.   Personal Factors and Comorbidities Age;Comorbidity 3+;Education;Past/Current Experience;Fitness;Time since onset of injury/illness/exacerbation;Social Background    Comorbidities Relevant past medical history and comorbidities include anxiety, bilateral chronic knee pain, arthritis, chronic back pain, GERD, memory changes, sinus bradycardia, urge incontinence, R foot bunionectomy (2013), former smoker.    Examination-Activity Limitations Bed Mobility;Lift;Stairs;Squat;Bend;Locomotion Level;Stand;Caring for Others;Carry;Transfers;Dressing    Examination-Participation Restrictions Laundry;Church;Cleaning;Shop;Community Activity;Meal Prep;Driving;Yard Work    Merchant navy officer Evolving/Moderate complexity    Clinical Decision Making Moderate    Rehab Potential Fair    PT Frequency 2x / week    PT Duration 12 weeks    PT Treatment/Interventions ADLs/Self Care Home Management;Aquatic Therapy;Cryotherapy;Moist Heat;Functional mobility training;Stair training;Gait training;DME Instruction;Therapeutic activities;Therapeutic  exercise;Balance training;Orthotic Fit/Training;Neuromuscular re-education;Patient/family education;Manual techniques;Passive range of motion;Dry needling;Electrical Stimulation;Energy conservation;Joint Manipulations;Spinal Manipulations;Taping;Splinting    PT Next Visit Plan assess 6MWT, 10MWT. Updated HEP, strengthening/balance exercises    PT Home Exercise Plan to be updated next session as appropriate    Consulted and Agree with Plan of Care Patient           Patient will benefit from skilled therapeutic intervention in order to improve the following deficits and impairments:  Abnormal gait,Decreased knowledge of use of DME,Impaired sensation,Improper body mechanics,Pain,Decreased coordination,Decreased mobility,Impaired tone,Postural dysfunction,Decreased activity tolerance,Decreased endurance,Decreased range of motion,Decreased strength,Impaired perceived functional ability,Difficulty walking,Decreased balance  Visit Diagnosis: Difficulty in walking, not elsewhere classified  Muscle weakness (generalized)  Other symptoms and signs involving the musculoskeletal system  Bilateral low back pain without sciatica, unspecified chronicity     Problem List Patient Active Problem List   Diagnosis Date Noted  . Chronic back pain 08/07/2019  . Depression 08/07/2019  . Near syncope 08/06/2019  . Sinus bradycardia 08/06/2019  . Weakness 11/03/2012   3:07 PM, 04/11/20 Etta Grandchild, PT, DPT Physical Therapist - [facility] [phone] (Office)    Jayvyn Haselton C 04/11/2020, 2:48 PM  [facility] [address] [place], [address] Phone: [phone]   Fax:  [phone]  Name: KORTLYN TAKANO MRN: [id number] Date of Birth: 05/10/1945

## 2020-04-16 ENCOUNTER — Other Ambulatory Visit: Payer: Self-pay

## 2020-04-16 ENCOUNTER — Encounter: Payer: Self-pay | Admitting: Physical Therapy

## 2020-04-16 ENCOUNTER — Ambulatory Visit: Payer: Medicare Other | Admitting: Physical Therapy

## 2020-04-16 DIAGNOSIS — M545 Low back pain, unspecified: Secondary | ICD-10-CM | POA: Diagnosis not present

## 2020-04-16 DIAGNOSIS — R262 Difficulty in walking, not elsewhere classified: Secondary | ICD-10-CM | POA: Diagnosis not present

## 2020-04-16 DIAGNOSIS — R29898 Other symptoms and signs involving the musculoskeletal system: Secondary | ICD-10-CM | POA: Diagnosis not present

## 2020-04-16 DIAGNOSIS — M25561 Pain in right knee: Secondary | ICD-10-CM | POA: Diagnosis not present

## 2020-04-16 DIAGNOSIS — M6281 Muscle weakness (generalized): Secondary | ICD-10-CM | POA: Diagnosis not present

## 2020-04-16 DIAGNOSIS — R2681 Unsteadiness on feet: Secondary | ICD-10-CM | POA: Diagnosis not present

## 2020-04-16 DIAGNOSIS — M25562 Pain in left knee: Secondary | ICD-10-CM

## 2020-04-16 NOTE — Therapy (Signed)
[facility] [address], [place], [address] Phone: [phone]   Fax:  [phone]  Physical Therapy Treatment  Patient Details  Name: [name] MRN: [id number] Date of Birth: 1946/01/07 Referring Provider (PT): Erline Hau, [place]   Encounter Date: 04/16/2020   PT End of Session - 04/16/20 1843    Visit Number 3    Number of Visits 24    Date for PT Re-Evaluation 06/26/20    Authorization Type Medicare reporting period from 04/03/2020    Progress Note Due on Visit 10    PT Start Time 1520    PT Stop Time 1600    PT Time Calculation (min) 40 min    Equipment Utilized During Treatment Gait belt    Activity Tolerance Patient tolerated treatment well    Behavior During Therapy [facility] for tasks assessed/performed           Past Medical History:  Diagnosis Date  . Anxiety   . Arthritis   . Bilateral chronic knee pain   . Chronic back pain   . Depression   . GERD (gastroesophageal reflux disease)   . Insomnia   . Memory changes   . Sinus bradycardia   . Urge incontinence     Past Surgical History:  Procedure Laterality Date  . ABDOMINAL HYSTERECTOMY  1995  . back sugery     lumbar  . BIOPSY THYROID     benign goiter  . BUNIONECTOMY  2013   rt foot  . COLONOSCOPY    . MUSCLE BIOPSY Left 10/03/2012   Procedure: LEFT QUADRICEP MUSCLE BIOPSY;  Surgeon: Odis Hollingshead, MD;  Location: [facility];  Service: General;  Laterality: Left;  . NECK SURGERY  2010   cerv disc fused     There were no vitals filed for this visit.   Subjective Assessment - 04/16/20 1842    Subjective Patient reports she is feeling well today. When asked about pain, states she doesn't have any pain to speak of over usual.    Pertinent History Patient is a 75 y.o. female who presents to outpatient physical therapy with a referral for medical diagnosis thoracic spine pain, lumbar spine fusion, generalized  weakness. This patient's chief complaints consist of weakness and difficulty moving R leg leading to the following functional deficits: increased difficulty with daily tasks and mobility including walking, stairs, getting in and out of the car, grocery shopping, general mobility, fear of falling.. She ambulates with a single point cane and carbon fiber AFO on the R ankle.  Relevant past medical history and comorbidities include anxiety, bilateral chronic knee pain, arthritis, chronic back pain, GERD, memory changes, sinus bradycardia, urge incontinence, R foot bunionectomy (2013), former smoker.   Patient denies hx of cancer, stroke, seizures, lung problem, major cardiac events, diabetes, unexplained weight loss, changes in bowel or bladder problems, new onset stumbling or dropping things.    Limitations Lifting;Standing;Walking;House hold activities    Diagnostic tests Chest/Lumbar CT report from 01/14/2020: " IMPRESSION:  1. No CT evidence for acute thoracic, abdominal or pelvic injury.  2. No acute fracture involving the thoracic or lumbar spine.  3. Chronic findings as detailed above."    Patient Stated Goals "to get as much out of it as I can to move better"    Currently in Pain? No/denies             OBJECTIVE - 6 Minute Walk Test: 329  feet with R AFO and SPC with SBA.  - 10 Meter Walk Trial: 0.54 meters/second with SPC and R AFO    TREATMENT:  R AFO donned throughout  Therapeutic exercise: to centralize symptoms and improve ROM, strength, muscular endurance, and activity tolerance required for successful completion of functional activities.  - seated marching with BUE support, x15 each side  - ambulation around clinic x 6min for time using SPC and SBA for safety. (475 feet) - ambulation across 2x20 feet for speed with SPC and SBA for safety.  - standing alternating toe taps on 9 inch step with left UE support on TM bar or counter and R UE support on SPC. 4x10 each side.  - standing hip  abduction with BUE support on counter, 3x10 - standing mini-squat with BUE support counter 1x10 (using knee dominant strategy, not improved with cuing).  - standing mini-squat with BUE support on sink (to encourage more use of hips, but continues to complain of knee pain and use knee dominant pattern).  - sit <> stand with BUE from armed chair, 2x5 (complains of knee pain during so limited sets/reps).  - Education on HEP including handout    HOME EXERCISE PROGRAM Access Code: [email or web address] URL: [email or web address] Date: 04/16/2020 Prepared by: Norton BlizzardSara Derriona Branscom  Exercises Standing Toe Taps - 1 x daily - 3 sets - 10 reps Standing Hip Abduction with Counter Support - 1 x daily - 3 sets - 10 reps Sit to Stand with Armchair - 1 x daily - 3 sets - 5 reps    PT Education - 04/16/20 1843    Education Details Exercise purpose/form. Self management techniques.    Person(s) Educated Patient    Methods Explanation;Demonstration;Verbal cues;Handout    Comprehension Verbalized understanding;Returned demonstration;Verbal cues required;Need further instruction            PT Short Term Goals - 04/03/20 2007      PT SHORT TERM GOAL #1   Title Be independent with initial home exercise program for self-management of symptoms.    Baseline To be initiated at visit 2 as appropriate (04/03/2020);    Time 2    Period Weeks    Status New    Target Date 04/17/20             PT Long Term Goals - 04/03/20 1602      PT LONG TERM GOAL #1   Title Be independent with a long-term home exercise program for self-management of symptoms.    Baseline to be updated at visit 2 as appropriate (04/03/2020);    Time 12    Period Weeks    Status New   TARGET DATE FOR ALL LONG TERM GOALS: 06/26/2020     PT LONG TERM GOAL #2   Title Demonstrate improved FOTO score to equal or greater than 56 by visit #20 demonstrate improvement in overall condition and self-reported functional ability.    Baseline 48  (04/03/2020);    Time 12    Period Weeks    Status New      PT LONG TERM GOAL #3   Title Patient will improve her 10 MWT speed with her SPC to at least 1.2 m/s to promote better community ambulation and safe crossing of streets.    Baseline to be measured visit 2 (04/03/2020);    Time 12    Period Weeks    Status New      PT LONG TERM GOAL #4   Title Patient will improve ABC score  by 13 percentage points to demonstrate improved self-reported balance.    Baseline 23.8% (04/03/2020);    Time 12    Period Weeks    Status New      PT LONG TERM GOAL #5   Title Patient will complete 5 Times Sit to Stand test from chair height without UE support in equal or less than 14 seconds to improve B LE power and strength for transfers and improved mobility, and demonstrate decreased fall risk (threshold between 12 and 15 for increased fall risk).    Baseline 28 seconds with great difficulty from 18.5 inch plinth with B UE support on mat and knees. Very unstable (04/03/2020);    Time 12    Period Weeks    Status New      Additional Long Term Goals   Additional Long Term Goals Yes      PT LONG TERM GOAL #6   Title Pateint will improve 6 Minute Walk Test distance to equal or greater than 1000 feet with LRAD to demonstrate improved activity tolerance and endurance for community mobility and participation.    Baseline to be tested visit 2 (04/04/2019);    Time 12    Period Weeks    Status New                 Plan - 04/16/20 2001    Clinical Impression Statement Patient tolerated treatment well overall but appeared fatigued by end of session. Is having a very hard time flexing R hip, which is affecting her gait pattern and fall risk so included exercises specific to this. Updated HEP to exercises patient showed the ability to perform safely in clinic and that will benefit her functional strength. Patient would benefit from continued management of limiting condition by skilled physical therapist to  address remaining impairments and functional limitations to work towards stated goals and return to PLOF or maximal functional independence.    Personal Factors and Comorbidities Age;Comorbidity 3+;Education;Past/Current Experience;Fitness;Time since onset of injury/illness/exacerbation;Social Background    Comorbidities Relevant past medical history and comorbidities include anxiety, bilateral chronic knee pain, arthritis, chronic back pain, GERD, memory changes, sinus bradycardia, urge incontinence, R foot bunionectomy (2013), former smoker.    Examination-Activity Limitations Bed Mobility;Lift;Stairs;Squat;Bend;Locomotion Level;Stand;Caring for Others;Carry;Transfers;Dressing    Examination-Participation Restrictions Laundry;Church;Cleaning;Shop;Community Activity;Meal Prep;Driving;Yard Work    Merchant navy officer Evolving/Moderate complexity    Rehab Potential Fair    PT Frequency 2x / week    PT Duration 12 weeks    PT Treatment/Interventions ADLs/Self Care Home Management;Aquatic Therapy;Cryotherapy;Moist Heat;Functional mobility training;Stair training;Gait training;DME Instruction;Therapeutic activities;Therapeutic exercise;Balance training;Orthotic Fit/Training;Neuromuscular re-education;Patient/family education;Manual techniques;Passive range of motion;Dry needling;Electrical Stimulation;Energy conservation;Joint Manipulations;Spinal Manipulations;Taping;Splinting    PT Next Visit Plan strengthening/balance exercises    PT Home Exercise Plan [email or web address] Access Code: [email or web address]    Consulted and Agree with Plan of Care Patient           Patient will benefit from skilled therapeutic intervention in order to improve the following deficits and impairments:  Abnormal gait,Decreased knowledge of use of DME,Impaired sensation,Improper body mechanics,Pain,Decreased coordination,Decreased mobility,Impaired tone,Postural dysfunction,Decreased activity tolerance,Decreased  endurance,Decreased range of motion,Decreased strength,Impaired perceived functional ability,Difficulty walking,Decreased balance  Visit Diagnosis: Difficulty in walking, not elsewhere classified  Muscle weakness (generalized)  Other symptoms and signs involving the musculoskeletal system  Bilateral low back pain without sciatica, unspecified chronicity  Unsteadiness on feet  Right knee pain, unspecified chronicity  Left knee pain, unspecified chronicity     Problem List Patient Active Problem List  Diagnosis Date Noted  . Chronic back pain 08/07/2019  . Depression 08/07/2019  . Near syncope 08/06/2019  . Sinus bradycardia 08/06/2019  . Weakness 11/03/2012    Everlean Alstrom. Graylon Good, PT, DPT 04/16/20, 8:02 PM  [facility] [address] [place], [address] Phone: [phone]   Fax:  [phone]  Name: [name] MRN: [id number] Date of Birth: 07-02-1945

## 2020-04-17 DIAGNOSIS — R29898 Other symptoms and signs involving the musculoskeletal system: Secondary | ICD-10-CM | POA: Diagnosis not present

## 2020-04-17 DIAGNOSIS — E669 Obesity, unspecified: Secondary | ICD-10-CM | POA: Diagnosis not present

## 2020-04-17 DIAGNOSIS — M1120 Other chondrocalcinosis, unspecified site: Secondary | ICD-10-CM | POA: Diagnosis not present

## 2020-04-17 DIAGNOSIS — Z6833 Body mass index (BMI) 33.0-33.9, adult: Secondary | ICD-10-CM | POA: Diagnosis not present

## 2020-04-17 DIAGNOSIS — M170 Bilateral primary osteoarthritis of knee: Secondary | ICD-10-CM | POA: Diagnosis not present

## 2020-04-17 DIAGNOSIS — Z79899 Other long term (current) drug therapy: Secondary | ICD-10-CM | POA: Diagnosis not present

## 2020-04-18 ENCOUNTER — Ambulatory Visit: Payer: Medicare Other | Admitting: Physical Therapy

## 2020-04-18 ENCOUNTER — Other Ambulatory Visit: Payer: Self-pay

## 2020-04-18 ENCOUNTER — Encounter: Payer: Self-pay | Admitting: Physical Therapy

## 2020-04-18 DIAGNOSIS — R29898 Other symptoms and signs involving the musculoskeletal system: Secondary | ICD-10-CM | POA: Diagnosis not present

## 2020-04-18 DIAGNOSIS — M25561 Pain in right knee: Secondary | ICD-10-CM | POA: Diagnosis not present

## 2020-04-18 DIAGNOSIS — M545 Low back pain, unspecified: Secondary | ICD-10-CM

## 2020-04-18 DIAGNOSIS — R2681 Unsteadiness on feet: Secondary | ICD-10-CM

## 2020-04-18 DIAGNOSIS — R262 Difficulty in walking, not elsewhere classified: Secondary | ICD-10-CM

## 2020-04-18 DIAGNOSIS — M6281 Muscle weakness (generalized): Secondary | ICD-10-CM

## 2020-04-18 DIAGNOSIS — M25562 Pain in left knee: Secondary | ICD-10-CM

## 2020-04-18 NOTE — Therapy (Signed)
[facility] [address] [place], [address] Phone: [phone]   Fax:  [phone]  Physical Therapy Treatment  Patient Details  Name: [name] MRN: [id number] Date of Birth: March 16, 1946 Referring Provider (PT): Erline Hau, [place]   Encounter Date: 04/18/2020   PT End of Session - 04/18/20 1853    Visit Number 4    Number of Visits 24    Date for PT Re-Evaluation 06/26/20    Authorization Type Medicare reporting period from 04/03/2020    Progress Note Due on Visit 10    PT Start Time 1515    PT Stop Time 1600    PT Time Calculation (min) 45 min    Equipment Utilized During Treatment Gait belt    Activity Tolerance Patient tolerated treatment well;Patient limited by pain    Behavior During Therapy [facility] for tasks assessed/performed           Past Medical History:  Diagnosis Date  . Anxiety   . Arthritis   . Bilateral chronic knee pain   . Chronic back pain   . Depression   . GERD (gastroesophageal reflux disease)   . Insomnia   . Memory changes   . Sinus bradycardia   . Urge incontinence     Past Surgical History:  Procedure Laterality Date  . ABDOMINAL HYSTERECTOMY  1995  . back sugery     lumbar  . BIOPSY THYROID     benign goiter  . BUNIONECTOMY  2013   rt foot  . COLONOSCOPY    . MUSCLE BIOPSY Left 10/03/2012   Procedure: LEFT QUADRICEP MUSCLE BIOPSY;  Surgeon: Odis Hollingshead, MD;  Location: [facility];  Service: General;  Laterality: Left;  . NECK SURGERY  2010   cerv disc fused     There were no vitals filed for this visit.   Subjective Assessment - 04/18/20 1516    Subjective Patient reports she is feeling well with her usual amount of pain that she cannot rate because it happens so often.    Pertinent History Patient is a 75 y.o. female who presents to outpatient physical therapy with a referral for medical diagnosis thoracic spine pain, lumbar spine  fusion, generalized weakness. This patient's chief complaints consist of weakness and difficulty moving R leg leading to the following functional deficits: increased difficulty with daily tasks and mobility including walking, stairs, getting in and out of the car, grocery shopping, general mobility, fear of falling.. She ambulates with a single point cane and carbon fiber AFO on the R ankle.  Relevant past medical history and comorbidities include anxiety, bilateral chronic knee pain, arthritis, chronic back pain, GERD, memory changes, sinus bradycardia, urge incontinence, R foot bunionectomy (2013), former smoker.   Patient denies hx of cancer, stroke, seizures, lung problem, major cardiac events, diabetes, unexplained weight loss, changes in bowel or bladder problems, new onset stumbling or dropping things.    Limitations Lifting;Standing;Walking;House hold activities    Diagnostic tests Chest/Lumbar CT report from 01/14/2020: " IMPRESSION:  1. No CT evidence for acute thoracic, abdominal or pelvic injury.  2. No acute fracture involving the thoracic or lumbar spine.  3. Chronic findings as detailed above."    Patient Stated Goals "to get as much out of it as I can to move better"    Currently in Pain? Yes              OBJECTIVE - 6 Minute  Walk Test: 475 feet with R AFO and SPC with SBA.  - 10 Meter Walk Trial: 0.54 meters/second with SPC and R AFO    TREATMENT:  R AFO donned throughout  Therapeutic exercise:to centralize symptoms and improve ROM, strength, muscular endurance, and activity tolerance required for successful completion of functional activities.  - seated marching with BUE support, x15 each side  - ambulation around clinic x 77min for time using SPC and SBA for safety. (500 feet) - standing alternating toe taps on 9 inch step with left UE support on TM bar or counter and R UE support on SPC. 4x10 each side.  - standing hip abduction with BUE support on counter, 3x10, 1 second  hold.  - standing mini-squat with BUE support counter 3x10, extensive cuing.  - side stepping alternating directions with BUE and green theraband around knees, 3x10 each way.  - standing dead lift from 9 inch step, 1x10 (pain at R knee with flexion so she keeps it straight resulting in lumbar flexion).  - ambulation ~ 100 feet to vehicle with SBA-minA (curb) using SPC.   HOME EXERCISE PROGRAM Access Code: [email or web address] URL: [name] Date: 04/16/2020 Prepared by: Rosita Kea  Exercises Standing Toe Taps - 1 x daily - 3 sets - 10 reps Standing Hip Abduction with Counter Support - 1 x daily - 3 sets - 10 reps Sit to Stand with Armchair - 1 x daily - 3 sets - 5 reps     PT Education - 04/18/20 1853    Education Details Exercise purpose/form. Self management techniques.    Person(s) Educated Patient    Methods Explanation;Demonstration;Tactile cues;Verbal cues    Comprehension Verbalized understanding;Returned demonstration;Verbal cues required;Tactile cues required;Need further instruction            PT Short Term Goals - 04/03/20 2007      PT SHORT TERM GOAL #1   Title Be independent with initial home exercise program for self-management of symptoms.    Baseline To be initiated at visit 2 as appropriate (04/03/2020);    Time 2    Period Weeks    Status New    Target Date 04/17/20             PT Long Term Goals - 04/03/20 1602      PT LONG TERM GOAL #1   Title Be independent with a long-term home exercise program for self-management of symptoms.    Baseline to be updated at visit 2 as appropriate (04/03/2020);    Time 12    Period Weeks    Status New   TARGET DATE FOR ALL LONG TERM GOALS: 06/26/2020     PT LONG TERM GOAL #2   Title Demonstrate improved FOTO score to equal or greater than 56 by visit #20 demonstrate improvement in overall condition and self-reported functional ability.    Baseline 48 (04/03/2020);    Time 12    Period Weeks     Status New      PT LONG TERM GOAL #3   Title Patient will improve her 10 MWT speed with her SPC to at least 1.2 m/s to promote better community ambulation and safe crossing of streets.    Baseline to be measured visit 2 (04/03/2020);    Time 12    Period Weeks    Status New      PT LONG TERM GOAL #4   Title Patient will improve ABC score by 13 percentage points to demonstrate improved self-reported balance.  Baseline 23.8% (04/03/2020);    Time 12    Period Weeks    Status New      PT LONG TERM GOAL #5   Title Patient will complete 5 Times Sit to Stand test from chair height without UE support in equal or less than 14 seconds to improve B LE power and strength for transfers and improved mobility, and demonstrate decreased fall risk (threshold between 12 and 15 for increased fall risk).    Baseline 28 seconds with great difficulty from 18.5 inch plinth with B UE support on mat and knees. Very unstable (04/03/2020);    Time 12    Period Weeks    Status New      Additional Long Term Goals   Additional Long Term Goals Yes      PT LONG TERM GOAL #6   Title Pateint will improve 6 Minute Walk Test distance to equal or greater than 1000 feet with LRAD to demonstrate improved activity tolerance and endurance for community mobility and participation.    Baseline to be tested visit 2 (04/04/2019);    Time 12    Period Weeks    Status New                 Plan - 04/18/20 1857    Clinical Impression Statement Patient tolerated treatment with some difficulty due to fatigue, especially by end of session. Also complained of pain at the right knee with weight bearing exercises involving knee flexion. Recommend attempting open chain quad exercises next session as able. Focused on functional LE strengthening today. Patient has very weak R hip abductors and external rotators and is unable to keep R knee from valgus position. Patient would also benefit from updated assessment from orthotist and  fitting for new AFO or other device recommended by them to improve her LE mechanics. She did not bring her AFO today due to difficulty with the velcro. Patient would benefit from continued management of limiting condition by skilled physical therapist to address remaining impairments and functional limitations to work towards stated goals and return to PLOF or maximal functional independence.    Personal Factors and Comorbidities Age;Comorbidity 3+;Education;Past/Current Experience;Fitness;Time since onset of injury/illness/exacerbation;Social Background    Comorbidities Relevant past medical history and comorbidities include anxiety, bilateral chronic knee pain, arthritis, chronic back pain, GERD, memory changes, sinus bradycardia, urge incontinence, R foot bunionectomy (2013), former smoker.    Examination-Activity Limitations Bed Mobility;Lift;Stairs;Squat;Bend;Locomotion Level;Stand;Caring for Others;Carry;Transfers;Dressing    Examination-Participation Restrictions Laundry;Church;Cleaning;Shop;Community Activity;Meal Prep;Driving;Yard Work    Conservation officer, historic buildingstability/Clinical Decision Making Evolving/Moderate complexity    Rehab Potential Fair    PT Frequency 2x / week    PT Duration 12 weeks    PT Treatment/Interventions ADLs/Self Care Home Management;Aquatic Therapy;Cryotherapy;Moist Heat;Functional mobility training;Stair training;Gait training;DME Instruction;Therapeutic activities;Therapeutic exercise;Balance training;Orthotic Fit/Training;Neuromuscular re-education;Patient/family education;Manual techniques;Passive range of motion;Dry needling;Electrical Stimulation;Energy conservation;Joint Manipulations;Spinal Manipulations;Taping;Splinting    PT Next Visit Plan strengthening/balance exercises    PT Home Exercise Plan [email or web address] Access Code: [email or web address]    Consulted and Agree with Plan of Care Patient           Patient will benefit from skilled therapeutic intervention in order to improve the  following deficits and impairments:  Abnormal gait,Decreased knowledge of use of DME,Impaired sensation,Improper body mechanics,Pain,Decreased coordination,Decreased mobility,Impaired tone,Postural dysfunction,Decreased activity tolerance,Decreased endurance,Decreased range of motion,Decreased strength,Impaired perceived functional ability,Difficulty walking,Decreased balance  Visit Diagnosis: Difficulty in walking, not elsewhere classified  Muscle weakness (generalized)  Other symptoms and signs involving the musculoskeletal system  Bilateral  low back pain without sciatica, unspecified chronicity  Unsteadiness on feet  Right knee pain, unspecified chronicity  Left knee pain, unspecified chronicity     Problem List Patient Active Problem List   Diagnosis Date Noted  . Chronic back pain 08/07/2019  . Depression 08/07/2019  . Near syncope 08/06/2019  . Sinus bradycardia 08/06/2019  . Weakness 11/03/2012    Everlean Alstrom. Graylon Good, PT, DPT 04/18/20, 6:58 PM  [facility] [address], [place], [address] Phone: [phone]   Fax:  [phone]  Name: [name] MRN: [id number] Date of Birth: [birth year]

## 2020-04-20 DIAGNOSIS — F419 Anxiety disorder, unspecified: Secondary | ICD-10-CM | POA: Diagnosis not present

## 2020-04-20 DIAGNOSIS — M170 Bilateral primary osteoarthritis of knee: Secondary | ICD-10-CM | POA: Diagnosis not present

## 2020-04-20 DIAGNOSIS — K219 Gastro-esophageal reflux disease without esophagitis: Secondary | ICD-10-CM | POA: Diagnosis not present

## 2020-04-23 ENCOUNTER — Ambulatory Visit: Payer: Medicare Other | Admitting: Physical Therapy

## 2020-04-24 ENCOUNTER — Other Ambulatory Visit: Payer: Self-pay

## 2020-04-24 ENCOUNTER — Encounter: Payer: Self-pay | Admitting: Physical Therapy

## 2020-04-24 ENCOUNTER — Ambulatory Visit: Payer: Medicare Other | Attending: Physician Assistant | Admitting: Physical Therapy

## 2020-04-24 DIAGNOSIS — R29898 Other symptoms and signs involving the musculoskeletal system: Secondary | ICD-10-CM | POA: Diagnosis not present

## 2020-04-24 DIAGNOSIS — M5386 Other specified dorsopathies, lumbar region: Secondary | ICD-10-CM | POA: Diagnosis not present

## 2020-04-24 DIAGNOSIS — M6281 Muscle weakness (generalized): Secondary | ICD-10-CM | POA: Insufficient documentation

## 2020-04-24 DIAGNOSIS — M2560 Stiffness of unspecified joint, not elsewhere classified: Secondary | ICD-10-CM | POA: Insufficient documentation

## 2020-04-24 DIAGNOSIS — M25562 Pain in left knee: Secondary | ICD-10-CM

## 2020-04-24 DIAGNOSIS — R269 Unspecified abnormalities of gait and mobility: Secondary | ICD-10-CM | POA: Insufficient documentation

## 2020-04-24 DIAGNOSIS — R2689 Other abnormalities of gait and mobility: Secondary | ICD-10-CM | POA: Diagnosis not present

## 2020-04-24 DIAGNOSIS — R208 Other disturbances of skin sensation: Secondary | ICD-10-CM | POA: Diagnosis not present

## 2020-04-24 DIAGNOSIS — M79605 Pain in left leg: Secondary | ICD-10-CM | POA: Diagnosis not present

## 2020-04-24 DIAGNOSIS — M545 Low back pain, unspecified: Secondary | ICD-10-CM

## 2020-04-24 DIAGNOSIS — M79604 Pain in right leg: Secondary | ICD-10-CM | POA: Insufficient documentation

## 2020-04-24 DIAGNOSIS — R2681 Unsteadiness on feet: Secondary | ICD-10-CM

## 2020-04-24 DIAGNOSIS — R262 Difficulty in walking, not elsewhere classified: Secondary | ICD-10-CM | POA: Insufficient documentation

## 2020-04-24 DIAGNOSIS — M25561 Pain in right knee: Secondary | ICD-10-CM | POA: Diagnosis not present

## 2020-04-24 NOTE — Therapy (Signed)
[facility] [address], [place], [address] Phone: [phone]   Fax:  [phone]  Physical Therapy Treatment  Patient Details  Name: [name] MRN: [id number] Date of Birth: [birth year] Referring Provider (PT): Erline Hau, [place]   Encounter Date: 04/24/2020   PT End of Session - 04/24/20 1528    Visit Number 5    Number of Visits 24    Date for PT Re-Evaluation 06/26/20    Authorization Type Medicare reporting period from 04/03/2020    Progress Note Due on Visit 10    PT Start Time 1440    PT Stop Time 1525    PT Time Calculation (min) 45 min    Equipment Utilized During Treatment Gait belt    Activity Tolerance Patient tolerated treatment well;Patient limited by pain    Behavior During Therapy [facility] for tasks assessed/performed           Past Medical History:  Diagnosis Date  . Anxiety   . Arthritis   . Bilateral chronic knee pain   . Chronic back pain   . Depression   . GERD (gastroesophageal reflux disease)   . Insomnia   . Memory changes   . Sinus bradycardia   . Urge incontinence     Past Surgical History:  Procedure Laterality Date  . ABDOMINAL HYSTERECTOMY  1995  . back sugery     lumbar  . BIOPSY THYROID     benign goiter  . BUNIONECTOMY  2013   rt foot  . COLONOSCOPY    . MUSCLE BIOPSY Left 10/03/2012   Procedure: LEFT QUADRICEP MUSCLE BIOPSY;  Surgeon: Odis Hollingshead, MD;  Location: [facility];  Service: General;  Laterality: Left;  . NECK SURGERY  2010   cerv disc fused     There were no vitals filed for this visit.   Subjective Assessment - 04/24/20 1527    Subjective Patient reports she is a bit tired today. Was tired but no extra pain following last session. Stats her doctor told her last week she will need a TKA in the future. she wants to put it off as much as possible. State she has her usual pain in the right knee. No rating given. .     Pertinent History Patient is a 75 y.o. female who presents to outpatient physical therapy with a referral for medical diagnosis thoracic spine pain, lumbar spine fusion, generalized weakness. This patient's chief complaints consist of weakness and difficulty moving R leg leading to the following functional deficits: increased difficulty with daily tasks and mobility including walking, stairs, getting in and out of the car, grocery shopping, general mobility, fear of falling.. She ambulates with a single point cane and carbon fiber AFO on the R ankle.  Relevant past medical history and comorbidities include anxiety, bilateral chronic knee pain, arthritis, chronic back pain, GERD, memory changes, sinus bradycardia, urge incontinence, R foot bunionectomy (2013), former smoker.   Patient denies hx of cancer, stroke, seizures, lung problem, major cardiac events, diabetes, unexplained weight loss, changes in bowel or bladder problems, new onset stumbling or dropping things.    Limitations Lifting;Standing;Walking;House hold activities    Diagnostic tests Chest/Lumbar CT report from 01/14/2020: " IMPRESSION:  1. No CT evidence for acute thoracic, abdominal or pelvic injury.  2. No acute fracture involving the thoracic or lumbar spine.  3. Chronic findings as detailed above."    Patient Stated Goals "to get  as much out of it as I can to move better"    Currently in Pain? Yes           TREATMENT: R AFO donned throughout  Therapeutic exercise:to centralize symptoms and improve ROM, strength, muscular endurance, and activity tolerance required for successful completion of functional activities. - ambulation around clinic x for time using SPC and SBA for safety. (574 feet) - standing alternating toe taps on 9 inch step with left UE support on bar or counter and R UE support on SPC. x10 each side.  - standing hip abduction with BUE support on counter, 3x10, 1 second hold.  - standing mini-squat with BUE  support counter 3x10, extensive cuing for hips back and form.  - side stepping alternating directions with BUE and green theraband around knees, 3x10 each way. - seated long arc quad, 3x10 each side, 10# AW - ambulation ~ 100 feet to vehicle with SBA-minA (curb) using SPC.  HOME EXERCISE PROGRAM Access Code: [email or web address] URL: [email or web address] Date: 04/16/2020 Prepared by: Norton Blizzard  Exercises Standing Toe Taps - 1 x daily - 3 sets - 10 reps Standing Hip Abduction with Counter Support - 1 x daily - 3 sets - 10 reps Sit to Stand with Armchair - 1 x daily - 3 sets - 5 reps     PT Education - 04/24/20 1519    Education Details Exercise purpose/form. Self management techniques. [facility]    Person(s) Educated Patient    Methods Explanation;Demonstration;Tactile cues;Verbal cues    Comprehension Verbalized understanding;Returned demonstration;Verbal cues required;Tactile cues required;Need further instruction            PT Short Term Goals - 04/24/20 1519      PT SHORT TERM GOAL #1   Title Be independent with initial home exercise program for self-management of symptoms.    Baseline To be initiated at visit 2 as appropriate (04/03/2020); provided but not participating (04/24/2020);    Time 2    Period Weeks    Status On-going    Target Date 04/17/20             PT Long Term Goals - 04/03/20 1602      PT LONG TERM GOAL #1   Title Be independent with a long-term home exercise program for self-management of symptoms.    Baseline to be updated at visit 2 as appropriate (04/03/2020);    Time 12    Period Weeks    Status New   TARGET DATE FOR ALL LONG TERM GOALS: 06/26/2020     PT LONG TERM GOAL #2   Title Demonstrate improved FOTO score to equal or greater than 56 by visit #20 demonstrate improvement in overall condition and self-reported functional ability.    Baseline 48 (04/03/2020);    Time 12    Period Weeks    Status New      PT LONG TERM GOAL #3    Title Patient will improve her 10 MWT speed with her SPC to at least 1.2 m/s to promote better community ambulation and safe crossing of streets.    Baseline to be measured visit 2 (04/03/2020);    Time 12    Period Weeks    Status New      PT LONG TERM GOAL #4   Title Patient will improve ABC score by 13 percentage points to demonstrate improved self-reported balance.    Baseline 23.8% (04/03/2020);    Time 12    Period Weeks  Status New      PT LONG TERM GOAL #5   Title Patient will complete 5 Times Sit to Stand test from chair height without UE support in equal or less than 14 seconds to improve B LE power and strength for transfers and improved mobility, and demonstrate decreased fall risk (threshold between 12 and 15 for increased fall risk).    Baseline 28 seconds with great difficulty from 18.5 inch plinth with B UE support on mat and knees. Very unstable (04/03/2020);    Time 12    Period Weeks    Status New      Additional Long Term Goals   Additional Long Term Goals Yes      PT LONG TERM GOAL #6   Title Pateint will improve 6 Minute Walk Test distance to equal or greater than 1000 feet with LRAD to demonstrate improved activity tolerance and endurance for community mobility and participation.    Baseline to be tested visit 2 (04/04/2019);    Time 12    Period Weeks    Status New                 Plan - 04/24/20 1518    Clinical Impression Statement Patient tolerated treatment well overall but continues to have difficulty with pain in R knee and lack of hip strength. Focused on improving LE strength and ambulation ability. Patient would benefit from continued management of limiting condition by skilled physical therapist to address remaining impairments and functional limitations to work towards stated goals and return to PLOF or maximal functional independence.    Personal Factors and Comorbidities Age;Comorbidity 3+;Education;Past/Current Experience;Fitness;Time  since onset of injury/illness/exacerbation;Social Background    Comorbidities Relevant past medical history and comorbidities include anxiety, bilateral chronic knee pain, arthritis, chronic back pain, GERD, memory changes, sinus bradycardia, urge incontinence, R foot bunionectomy (2013), former smoker.    Examination-Activity Limitations Bed Mobility;Lift;Stairs;Squat;Bend;Locomotion Level;Stand;Caring for Others;Carry;Transfers;Dressing    Examination-Participation Restrictions Laundry;Church;Cleaning;Shop;Community Activity;Meal Prep;Driving;Yard Work    [employer] Evolving/Moderate complexity    Rehab Potential Fair    PT Frequency 2x / week    PT Duration 12 weeks    PT Treatment/Interventions ADLs/Self Care Home Management;Aquatic Therapy;Cryotherapy;Moist Heat;Functional mobility training;Stair training;Gait training;DME Instruction;Therapeutic activities;Therapeutic exercise;Balance training;Orthotic Fit/Training;Neuromuscular re-education;Patient/family education;Manual techniques;Passive range of motion;Dry needling;Electrical Stimulation;Energy conservation;Joint Manipulations;Spinal Manipulations;Taping;Splinting    PT Next Visit Plan strengthening/balance exercises    PT Home Exercise Plan [email or web address] Access Code: [email or web address]    Consulted and Agree with Plan of Care Patient           Patient will benefit from skilled therapeutic intervention in order to improve the following deficits and impairments:  Abnormal gait,Decreased knowledge of use of DME,Impaired sensation,Improper body mechanics,Pain,Decreased coordination,Decreased mobility,Impaired tone,Postural dysfunction,Decreased activity tolerance,Decreased endurance,Decreased range of motion,Decreased strength,Impaired perceived functional ability,Difficulty walking,Decreased balance  Visit Diagnosis: Difficulty in walking, not elsewhere classified  Muscle weakness (generalized)  Other symptoms and signs  involving the musculoskeletal system  Bilateral low back pain without sciatica, unspecified chronicity  Unsteadiness on feet  Right knee pain, unspecified chronicity  Left knee pain, unspecified chronicity     Problem List Patient Active Problem List   Diagnosis Date Noted  . Chronic back pain 08/07/2019  . Depression 08/07/2019  . Near syncope 08/06/2019  . Sinus bradycardia 08/06/2019  . Weakness 11/03/2012    Everlean Alstrom. Graylon Good, PT, DPT 04/24/20, 3:29 PM  [facility] [address] [place], [address]  Phone: [phone]   Fax:  [phone]  Name: [name] MRN: [id number] Date of Birth: 1945/06/21

## 2020-04-25 ENCOUNTER — Ambulatory Visit: Payer: Medicare Other | Admitting: Physical Therapy

## 2020-04-25 ENCOUNTER — Encounter: Payer: Self-pay | Admitting: Physical Therapy

## 2020-04-25 ENCOUNTER — Other Ambulatory Visit: Payer: Self-pay

## 2020-04-25 DIAGNOSIS — M25561 Pain in right knee: Secondary | ICD-10-CM | POA: Diagnosis not present

## 2020-04-25 DIAGNOSIS — R262 Difficulty in walking, not elsewhere classified: Secondary | ICD-10-CM

## 2020-04-25 DIAGNOSIS — M545 Low back pain, unspecified: Secondary | ICD-10-CM | POA: Diagnosis not present

## 2020-04-25 DIAGNOSIS — M25562 Pain in left knee: Secondary | ICD-10-CM

## 2020-04-25 DIAGNOSIS — R29898 Other symptoms and signs involving the musculoskeletal system: Secondary | ICD-10-CM

## 2020-04-25 DIAGNOSIS — R2681 Unsteadiness on feet: Secondary | ICD-10-CM

## 2020-04-25 DIAGNOSIS — M6281 Muscle weakness (generalized): Secondary | ICD-10-CM

## 2020-04-25 NOTE — Therapy (Signed)
[facility] [address], [place], [address] Phone: [phone]   Fax:  [phone]  Physical Therapy Treatment  Patient Details  Name: [name] MRN: [id number] Date of Birth: [birth year] Referring Provider (PT): Erline Hau, [place]   Encounter Date: 04/25/2020   PT End of Session - 04/25/20 1319    Visit Number 6    Number of Visits 24    Date for PT Re-Evaluation 06/26/20    Authorization Type Medicare reporting period from 04/03/2020    Progress Note Due on Visit 10    PT Start Time 1306    PT Stop Time 1345    PT Time Calculation (min) 39 min    Equipment Utilized During Treatment Gait belt    Activity Tolerance Patient tolerated treatment well;Patient limited by fatigue    Behavior During Therapy [facility] for tasks assessed/performed           Past Medical History:  Diagnosis Date  . Anxiety   . Arthritis   . Bilateral chronic knee pain   . Chronic back pain   . Depression   . GERD (gastroesophageal reflux disease)   . Insomnia   . Memory changes   . Sinus bradycardia   . Urge incontinence     Past Surgical History:  Procedure Laterality Date  . ABDOMINAL HYSTERECTOMY  1995  . back sugery     lumbar  . BIOPSY THYROID     benign goiter  . BUNIONECTOMY  2013   rt foot  . COLONOSCOPY    . MUSCLE BIOPSY Left 10/03/2012   Procedure: LEFT QUADRICEP MUSCLE BIOPSY;  Surgeon: Odis Hollingshead, MD;  Location: [facility];  Service: General;  Laterality: Left;  . NECK SURGERY  2010   cerv disc fused     There were no vitals filed for this visit.   Subjective Assessment - 04/25/20 1317    Subjective Patient reports she is tired today and her R knee is a little sore as usual. Was last here yesterday.    Pertinent History Patient is a 75 y.o. female who presents to outpatient physical therapy with a referral for medical diagnosis thoracic spine pain, lumbar spine fusion,  generalized weakness. This patient's chief complaints consist of weakness and difficulty moving R leg leading to the following functional deficits: increased difficulty with daily tasks and mobility including walking, stairs, getting in and out of the car, grocery shopping, general mobility, fear of falling.. She ambulates with a single point cane and carbon fiber AFO on the R ankle.  Relevant past medical history and comorbidities include anxiety, bilateral chronic knee pain, arthritis, chronic back pain, GERD, memory changes, sinus bradycardia, urge incontinence, R foot bunionectomy (2013), former smoker.   Patient denies hx of cancer, stroke, seizures, lung problem, major cardiac events, diabetes, unexplained weight loss, changes in bowel or bladder problems, new onset stumbling or dropping things.    Limitations Lifting;Standing;Walking;House hold activities    Diagnostic tests Chest/Lumbar CT report from 01/14/2020: " IMPRESSION:  1. No CT evidence for acute thoracic, abdominal or pelvic injury.  2. No acute fracture involving the thoracic or lumbar spine.  3. Chronic findings as detailed above."    Patient Stated Goals "to get as much out of it as I can to move better"    Currently in Pain? Yes    Pain Score --   no rating given  TREATMENT: R AFO donned throughout  Therapeutic exercise:to centralize symptoms and improve ROM, strength, muscular endurance, and activity tolerance required for successful completion of functional activities. - ambulation around clinic x for time using SPC and SBA for safety. (454feet) - standing alternating toe taps on 9 inch step with left UE support on bar or counter and R UE support on SPC. x10 each side.  - standing hip abduction with BUE support on counter, 3x10, 1 second hold. - standing mini-squat with BUE support counter3x10, extensive cuing for hips back and form. (chair with half circle ball in it for target for buttocks to achieve  deeper squat).   - side stepping alternating directions with BUE and green theraband around knees, 3x10 each way.  - seated long arc quad, 3x10 each side, 10# AW  - ambulation ~ 100 feet to vehicle with SBA-minA (curb) using SPC.  HOME EXERCISE PROGRAM Access Code: [email or web address] URL: [email or web address] Date: 04/16/2020 Prepared by: Norton Blizzard  Exercises Standing Toe Taps - 1 x daily - 3 sets - 10 reps Standing Hip Abduction with Counter Support - 1 x daily - 3 sets - 10 reps Sit to Stand with Armchair - 1 x daily - 3 sets - 5 reps     PT Education - 04/25/20 1319    Education Details Exercise purpose/form. Self management techniques    Person(s) Educated Patient    Methods Explanation;Demonstration;Tactile cues;Verbal cues    Comprehension Verbalized understanding;Returned demonstration;Verbal cues required;Tactile cues required;Need further instruction            PT Short Term Goals - 04/24/20 1519      PT SHORT TERM GOAL #1   Title Be independent with initial home exercise program for self-management of symptoms.    Baseline To be initiated at visit 2 as appropriate (04/03/2020); provided but not participating (04/24/2020);    Time 2    Period Weeks    Status On-going    Target Date 04/17/20             PT Long Term Goals - 04/03/20 1602      PT LONG TERM GOAL #1   Title Be independent with a long-term home exercise program for self-management of symptoms.    Baseline to be updated at visit 2 as appropriate (04/03/2020);    Time 12    Period Weeks    Status New   TARGET DATE FOR ALL LONG TERM GOALS: 06/26/2020     PT LONG TERM GOAL #2   Title Demonstrate improved FOTO score to equal or greater than 56 by visit #20 demonstrate improvement in overall condition and self-reported functional ability.    Baseline 48 (04/03/2020);    Time 12    Period Weeks    Status New      PT LONG TERM GOAL #3   Title Patient will improve her 10 MWT speed with her  SPC to at least 1.2 m/s to promote better community ambulation and safe crossing of streets.    Baseline to be measured visit 2 (04/03/2020);    Time 12    Period Weeks    Status New      PT LONG TERM GOAL #4   Title Patient will improve ABC score by 13 percentage points to demonstrate improved self-reported balance.    Baseline 23.8% (04/03/2020);    Time 12    Period Weeks    Status New      PT LONG TERM GOAL #5  Title Patient will complete 5 Times Sit to Stand test from chair height without UE support in equal or less than 14 seconds to improve B LE power and strength for transfers and improved mobility, and demonstrate decreased fall risk (threshold between 12 and 15 for increased fall risk).    Baseline 28 seconds with great difficulty from 18.5 inch plinth with B UE support on mat and knees. Very unstable (04/03/2020);    Time 12    Period Weeks    Status New      Additional Long Term Goals   Additional Long Term Goals Yes      PT LONG TERM GOAL #6   Title Pateint will improve 6 Minute Walk Test distance to equal or greater than 1000 feet with LRAD to demonstrate improved activity tolerance and endurance for community mobility and participation.    Baseline to be tested visit 2 (04/04/2019);    Time 12    Period Weeks    Status New                  Patient will benefit from skilled therapeutic intervention in order to improve the following deficits and impairments:     Visit Diagnosis: Difficulty in walking, not elsewhere classified  Muscle weakness (generalized)  Other symptoms and signs involving the musculoskeletal system  Bilateral low back pain without sciatica, unspecified chronicity  Unsteadiness on feet  Right knee pain, unspecified chronicity  Left knee pain, unspecified chronicity     Problem List Patient Active Problem List   Diagnosis Date Noted  . Chronic back pain 08/07/2019  . Depression 08/07/2019  . Near syncope 08/06/2019  .  Sinus bradycardia 08/06/2019  . Weakness 11/03/2012   Everlean Alstrom. Graylon Good, PT, DPT 04/25/20, 1:33 PM  [facility] [address] [place], [address] Phone: [phone]   Fax:  [phone]  Name: [name] MRN: [id number] Date of Birth: 10-20-1945

## 2020-04-30 ENCOUNTER — Other Ambulatory Visit: Payer: Self-pay

## 2020-04-30 ENCOUNTER — Ambulatory Visit: Payer: Medicare Other | Admitting: Physical Therapy

## 2020-04-30 DIAGNOSIS — R2681 Unsteadiness on feet: Secondary | ICD-10-CM

## 2020-04-30 DIAGNOSIS — M545 Low back pain, unspecified: Secondary | ICD-10-CM

## 2020-04-30 DIAGNOSIS — M25561 Pain in right knee: Secondary | ICD-10-CM

## 2020-04-30 DIAGNOSIS — R262 Difficulty in walking, not elsewhere classified: Secondary | ICD-10-CM | POA: Diagnosis not present

## 2020-04-30 DIAGNOSIS — R29898 Other symptoms and signs involving the musculoskeletal system: Secondary | ICD-10-CM

## 2020-04-30 DIAGNOSIS — M6281 Muscle weakness (generalized): Secondary | ICD-10-CM | POA: Diagnosis not present

## 2020-04-30 DIAGNOSIS — M25562 Pain in left knee: Secondary | ICD-10-CM

## 2020-04-30 NOTE — Therapy (Signed)
[facility] [address], [place], [address] Phone: [phone]   Fax:  [phone]  Physical Therapy Treatment  Patient Details  Name: [name] MRN: [id number] Date of Birth: 1945-07-22 Referring Provider (PT): Erline Hau, [place]   Encounter Date: 04/30/2020   PT End of Session - 04/30/20 1318    Visit Number 7    Number of Visits 24    Date for PT Re-Evaluation 06/26/20    Authorization Type Medicare reporting period from 04/03/2020    Progress Note Due on Visit 10    PT Start Time 1305    PT Stop Time 1343    PT Time Calculation (min) 38 min    Equipment Utilized During Treatment Gait belt    Activity Tolerance Patient tolerated treatment well;Patient limited by fatigue    Behavior During Therapy [facility] for tasks assessed/performed           Past Medical History:  Diagnosis Date  . Anxiety   . Arthritis   . Bilateral chronic knee pain   . Chronic back pain   . Depression   . GERD (gastroesophageal reflux disease)   . Insomnia   . Memory changes   . Sinus bradycardia   . Urge incontinence     Past Surgical History:  Procedure Laterality Date  . ABDOMINAL HYSTERECTOMY  1995  . back sugery     lumbar  . BIOPSY THYROID     benign goiter  . BUNIONECTOMY  2013   rt foot  . COLONOSCOPY    . MUSCLE BIOPSY Left 10/03/2012   Procedure: LEFT QUADRICEP MUSCLE BIOPSY;  Surgeon: Odis Hollingshead, MD;  Location: [facility];  Service: General;  Laterality: Left;  . NECK SURGERY  2010   cerv disc fused     There were no vitals filed for this visit.   Subjective Assessment - 04/30/20 1305    Subjective Patient 5 min late. State her knees and legs are hurting 8/10 pain today. Felt okay following last session. States she made breakfast this morning and did a lot of laundry the day before her knees started hurting more. Pain at anterior knees.    Pertinent History Patient is a 75  y.o. female who presents to outpatient physical therapy with a referral for medical diagnosis thoracic spine pain, lumbar spine fusion, generalized weakness. This patient's chief complaints consist of weakness and difficulty moving R leg leading to the following functional deficits: increased difficulty with daily tasks and mobility including walking, stairs, getting in and out of the car, grocery shopping, general mobility, fear of falling.. She ambulates with a single point cane and carbon fiber AFO on the R ankle.  Relevant past medical history and comorbidities include anxiety, bilateral chronic knee pain, arthritis, chronic back pain, GERD, memory changes, sinus bradycardia, urge incontinence, R foot bunionectomy (2013), former smoker.   Patient denies hx of cancer, stroke, seizures, lung problem, major cardiac events, diabetes, unexplained weight loss, changes in bowel or bladder problems, new onset stumbling or dropping things.    Limitations Lifting;Standing;Walking;House hold activities    Diagnostic tests Chest/Lumbar CT report from 01/14/2020: " IMPRESSION:  1. No CT evidence for acute thoracic, abdominal or pelvic injury.  2. No acute fracture involving the thoracic or lumbar spine.  3. Chronic findings as detailed above."    Patient Stated Goals "to get as much out of it as I can to move better"  Currently in Pain? Yes    Pain Score 8              TREATMENT: R AFO donned throughout  Therapeutic exercise:to centralize symptoms and improve ROM, strength, muscular endurance, and activity tolerance required for successful completion of functional activities. - ambulation around clinic x 58min for time using SPC and SBA for safety. (43feet) - standing alternating toe taps on 9 inch step with left UE support on bar or counter and R UE support on SPC. x10 each side.  - standing hip abduction with BUE support on counter, 3x10, 1 second hold.verbal/tactile cuing  - standing mini-squat  with BUE support counter3x10, extensive cuingfor hips back and form.(chair with half circle ball in it for target for buttocks to achieve deeper squat).  - side stepping alternating directions with BUE and green theraband around knees, 3x10 each way. - seated long arc quad, 3x10 each side, 10# AW - ambulation ~ 100 feet to vehicle with SBA-minA (curb) using SPC.  HOME EXERCISE PROGRAM Access Code: [email or web address] URL: [email or web address] Date: 04/16/2020 Prepared by: Rosita Kea  Exercises Standing Toe Taps - 1 x daily - 3 sets - 10 reps Standing Hip Abduction with Counter Support - 1 x daily - 3 sets - 10 reps Sit to Stand with Armchair - 1 x daily - 3 sets - 5 reps    PT Education - 04/30/20 1317    Education Details Exercise purpose/form. Self management techniques    Person(s) Educated Patient    Methods Explanation;Demonstration;Verbal cues    Comprehension Verbalized understanding;Returned demonstration;Verbal cues required;Need further instruction            PT Short Term Goals - 04/24/20 1519      PT SHORT TERM GOAL #1   Title Be independent with initial home exercise program for self-management of symptoms.    Baseline To be initiated at visit 2 as appropriate (04/03/2020); provided but not participating (04/24/2020);    Time 2    Period Weeks    Status On-going    Target Date 04/17/20             PT Long Term Goals - 04/03/20 1602      PT LONG TERM GOAL #1   Title Be independent with a long-term home exercise program for self-management of symptoms.    Baseline to be updated at visit 2 as appropriate (04/03/2020);    Time 12    Period Weeks    Status New   TARGET DATE FOR ALL LONG TERM GOALS: 06/26/2020     PT LONG TERM GOAL #2   Title Demonstrate improved FOTO score to equal or greater than 56 by visit #20 demonstrate improvement in overall condition and self-reported functional ability.    Baseline 48 (04/03/2020);    Time 12    Period  Weeks    Status New      PT LONG TERM GOAL #3   Title Patient will improve her 10 MWT speed with her SPC to at least 1.2 m/s to promote better community ambulation and safe crossing of streets.    Baseline to be measured visit 2 (04/03/2020);    Time 12    Period Weeks    Status New      PT LONG TERM GOAL #4   Title Patient will improve ABC score by 13 percentage points to demonstrate improved self-reported balance.    Baseline 23.8% (04/03/2020);    Time 12    Period Weeks  Status New      PT LONG TERM GOAL #5   Title Patient will complete 5 Times Sit to Stand test from chair height without UE support in equal or less than 14 seconds to improve B LE power and strength for transfers and improved mobility, and demonstrate decreased fall risk (threshold between 12 and 15 for increased fall risk).    Baseline 28 seconds with great difficulty from 18.5 inch plinth with B UE support on mat and knees. Very unstable (04/03/2020);    Time 12    Period Weeks    Status New      Additional Long Term Goals   Additional Long Term Goals Yes      PT LONG TERM GOAL #6   Title Pateint will improve 6 Minute Walk Test distance to equal or greater than 1000 feet with LRAD to demonstrate improved activity tolerance and endurance for community mobility and participation.    Baseline to be tested visit 2 (04/04/2019);    Time 12    Period Weeks    Status New                 Plan - 04/30/20 1337    Clinical Impression Statement Patient feeling fatigued today and had more difficulty completing exercises due to knee pain upon arrival. Reported no increase in pain with exercise and monitored throughout session. Continued to focus on LE strengthening for functional mobility. Patient would benefit from continued management of limiting condition by skilled physical therapist to address remaining impairments and functional limitations to work towards stated goals and return to PLOF or maximal functional  independence.    Personal Factors and Comorbidities Age;Comorbidity 3+;Education;Past/Current Experience;Fitness;Time since onset of injury/illness/exacerbation;Social Background    Comorbidities Relevant past medical history and comorbidities include anxiety, bilateral chronic knee pain, arthritis, chronic back pain, GERD, memory changes, sinus bradycardia, urge incontinence, R foot bunionectomy (2013), former smoker.    Examination-Activity Limitations Bed Mobility;Lift;Stairs;Squat;Bend;Locomotion Level;Stand;Caring for Others;Carry;Transfers;Dressing    Examination-Participation Restrictions Laundry;Church;Cleaning;Shop;Community Activity;Meal Prep;Driving;Yard Work    [employer] officer Evolving/Moderate complexity    Rehab Potential Fair    PT Frequency 2x / week    PT Duration 12 weeks    PT Treatment/Interventions ADLs/Self Care Home Management;Aquatic Therapy;Cryotherapy;Moist Heat;Functional mobility training;Stair training;Gait training;DME Instruction;Therapeutic activities;Therapeutic exercise;Balance training;Orthotic Fit/Training;Neuromuscular re-education;Patient/family education;Manual techniques;Passive range of motion;Dry needling;Electrical Stimulation;Energy conservation;Joint Manipulations;Spinal Manipulations;Taping;Splinting    PT Next Visit Plan strengthening/balance exercises    PT Home Exercise Plan [email or web address] Access Code: [email or web address]    Consulted and Agree with Plan of Care Patient           Patient will benefit from skilled therapeutic intervention in order to improve the following deficits and impairments:  Abnormal gait,Decreased knowledge of use of DME,Impaired sensation,Improper body mechanics,Pain,Decreased coordination,Decreased mobility,Impaired tone,Postural dysfunction,Decreased activity tolerance,Decreased endurance,Decreased range of motion,Decreased strength,Impaired perceived functional ability,Difficulty walking,Decreased balance  Visit  Diagnosis: Difficulty in walking, not elsewhere classified  Muscle weakness (generalized)  Other symptoms and signs involving the musculoskeletal system  Bilateral low back pain without sciatica, unspecified chronicity  Unsteadiness on feet  Right knee pain, unspecified chronicity  Left knee pain, unspecified chronicity     Problem List Patient Active Problem List   Diagnosis Date Noted  . Chronic back pain 08/07/2019  . Depression 08/07/2019  . Near syncope 08/06/2019  . Sinus bradycardia 08/06/2019  . Weakness 11/03/2012    Everlean Alstrom. Graylon Good, PT, DPT 04/30/20, 1:38 PM  [facility] [address] [place], [address] Phone: [phone]   Fax:  [phone]  Name: [name] MRN: [id number] Date of Birth: 13-Sep-1945

## 2020-05-02 ENCOUNTER — Encounter: Payer: Self-pay | Admitting: Physical Therapy

## 2020-05-02 ENCOUNTER — Ambulatory Visit: Payer: Medicare Other | Admitting: Physical Therapy

## 2020-05-02 ENCOUNTER — Other Ambulatory Visit: Payer: Self-pay

## 2020-05-02 DIAGNOSIS — R262 Difficulty in walking, not elsewhere classified: Secondary | ICD-10-CM

## 2020-05-02 DIAGNOSIS — R29898 Other symptoms and signs involving the musculoskeletal system: Secondary | ICD-10-CM

## 2020-05-02 DIAGNOSIS — M545 Low back pain, unspecified: Secondary | ICD-10-CM

## 2020-05-02 DIAGNOSIS — M6281 Muscle weakness (generalized): Secondary | ICD-10-CM | POA: Diagnosis not present

## 2020-05-02 DIAGNOSIS — R2681 Unsteadiness on feet: Secondary | ICD-10-CM

## 2020-05-02 DIAGNOSIS — M25561 Pain in right knee: Secondary | ICD-10-CM

## 2020-05-02 DIAGNOSIS — M25562 Pain in left knee: Secondary | ICD-10-CM

## 2020-05-02 NOTE — Therapy (Signed)
[facility] [address], [place], [address] Phone: [phone]   Fax:  [phone]  Physical Therapy Treatment  Patient Details  Name: [name] MRN: [id number] Date of Birth: January 25, 1946 Referring Provider (PT): Erline Hau, [place]   Encounter Date: 05/02/2020   PT End of Session - 05/02/20 1337    Visit Number 8    Number of Visits 24    Date for PT Re-Evaluation 06/26/20    Authorization Type Medicare reporting period from 04/03/2020    Progress Note Due on Visit 10    PT Start Time 9767    PT Stop Time 1413    PT Time Calculation (min) 36 min    Equipment Utilized During Treatment Gait belt    Activity Tolerance Patient tolerated treatment well;Patient limited by fatigue    Behavior During Therapy [facility] for tasks assessed/performed           Past Medical History:  Diagnosis Date  . Anxiety   . Arthritis   . Bilateral chronic knee pain   . Chronic back pain   . Depression   . GERD (gastroesophageal reflux disease)   . Insomnia   . Memory changes   . Sinus bradycardia   . Urge incontinence     Past Surgical History:  Procedure Laterality Date  . ABDOMINAL HYSTERECTOMY  1995  . back sugery     lumbar  . BIOPSY THYROID     benign goiter  . BUNIONECTOMY  2013   rt foot  . COLONOSCOPY    . MUSCLE BIOPSY Left 10/03/2012   Procedure: LEFT QUADRICEP MUSCLE BIOPSY;  Surgeon: Odis Hollingshead, MD;  Location: [facility];  Service: General;  Laterality: Left;  . NECK SURGERY  2010   cerv disc fused     There were no vitals filed for this visit.   Subjective Assessment - 05/02/20 1348    Subjective Patient 7 minutes late. States she could not get out of her car because vehicle was parked to close to her and person was on her cell phone and would not get off. Patient states she was tired following last treatment session. She has been purposfully eating more even though she  does not have an appetite and feeling more inergetic as a result. Reports no pain upon arrival. Has not been working on any HEP due to not feeling well lately .    Pertinent History Patient is a 74 y.o. female who presents to outpatient physical therapy with a referral for medical diagnosis thoracic spine pain, lumbar spine fusion, generalized weakness. This patient's chief complaints consist of weakness and difficulty moving R leg leading to the following functional deficits: increased difficulty with daily tasks and mobility including walking, stairs, getting in and out of the car, grocery shopping, general mobility, fear of falling.. She ambulates with a single point cane and carbon fiber AFO on the R ankle.  Relevant past medical history and comorbidities include anxiety, bilateral chronic knee pain, arthritis, chronic back pain, GERD, memory changes, sinus bradycardia, urge incontinence, R foot bunionectomy (2013), former smoker.   Patient denies hx of cancer, stroke, seizures, lung problem, major cardiac events, diabetes, unexplained weight loss, changes in bowel or bladder problems, new onset stumbling or dropping things.    Limitations Lifting;Standing;Walking;House hold activities    Diagnostic tests Chest/Lumbar CT report from 01/14/2020: " IMPRESSION:  1. No CT evidence for acute thoracic, abdominal or pelvic injury.  2. No acute fracture involving the thoracic or lumbar spine.  3. Chronic findings as detailed above."    Patient Stated Goals "to get as much out of it as I can to move better"    Currently in Pain? No/denies             TREATMENT: R AFO donned throughout  Therapeutic exercise:to centralize symptoms and improve ROM, strength, muscular endurance, and activity tolerance required for successful completion of functional activities. - ambulation around clinic x 50min for time using SPC and SBA for safety. (526feet) (seated rest) - standing alternating toe taps on 9 inch step  with left UE support and R UE support on SPC. x10 each side.  - standing hip abduction with BUE support, 3x10, 1 second hold.verbal/tactile cuing  - standing mini-squat with BUE support counter3x10,blue bolster on feet blocking between knees and side of stairs (holding stair rail for UE support), chair behind with large half circle spiked ball for target for buttocks to achieve deeper squat). (seated rest) - side stepping alternating directions with BUE and green theraband around knees, 3x10 each way. - seated long arc quad, 3110 each side, 10# AW - ambulation ~ 100 feet to vehicle with SBA-minA (curb) using SPC.  HOME EXERCISE PROGRAM Access Code: [email or web address] URL: [email or web address] Date: 04/16/2020 Prepared by: Rosita Kea  Exercises Standing Toe Taps - 1 x daily - 3 sets - 10 reps Standing Hip Abduction with Counter Support - 1 x daily - 3 sets - 10 reps Sit to Stand with Armchair - 1 x daily - 3 sets - 5 reps    PT Education - 05/02/20 1350    Education Details Exercise purpose/form. Self management techniques    Person(s) Educated Patient    Methods Explanation;Demonstration;Tactile cues;Verbal cues    Comprehension Verbalized understanding;Returned demonstration;Verbal cues required;Tactile cues required;Need further instruction            PT Short Term Goals - 04/24/20 1519      PT SHORT TERM GOAL #1   Title Be independent with initial home exercise program for self-management of symptoms.    Baseline To be initiated at visit 2 as appropriate (04/03/2020); provided but not participating (04/24/2020);    Time 2    Period Weeks    Status On-going    Target Date 04/17/20             PT Long Term Goals - 04/03/20 1602      PT LONG TERM GOAL #1   Title Be independent with a long-term home exercise program for self-management of symptoms.    Baseline to be updated at visit 2 as appropriate (04/03/2020);    Time 12    Period Weeks    Status New    TARGET DATE FOR ALL LONG TERM GOALS: 06/26/2020     PT LONG TERM GOAL #2   Title Demonstrate improved FOTO score to equal or greater than 56 by visit #20 demonstrate improvement in overall condition and self-reported functional ability.    Baseline 48 (04/03/2020);    Time 12    Period Weeks    Status New      PT LONG TERM GOAL #3   Title Patient will improve her 10 MWT speed with her SPC to at least 1.2 m/s to promote better community ambulation and safe crossing of streets.    Baseline to be measured visit 2 (04/03/2020);    Time 12    Period Weeks    Status New  PT LONG TERM GOAL #4   Title Patient will improve ABC score by 13 percentage points to demonstrate improved self-reported balance.    Baseline 23.8% (04/03/2020);    Time 12    Period Weeks    Status New      PT LONG TERM GOAL #5   Title Patient will complete 5 Times Sit to Stand test from chair height without UE support in equal or less than 14 seconds to improve B LE power and strength for transfers and improved mobility, and demonstrate decreased fall risk (threshold between 12 and 15 for increased fall risk).    Baseline 28 seconds with great difficulty from 18.5 inch plinth with B UE support on mat and knees. Very unstable (04/03/2020);    Time 12    Period Weeks    Status New      Additional Long Term Goals   Additional Long Term Goals Yes      PT LONG TERM GOAL #6   Title Pateint will improve 6 Minute Walk Test distance to equal or greater than 1000 feet with LRAD to demonstrate improved activity tolerance and endurance for community mobility and participation.    Baseline to be tested visit 2 (04/04/2019);    Time 12    Period Weeks    Status New                 Plan - 05/02/20 1414    Clinical Impression Statement Patient with more energy this session but continues to be hindered by quick fatigue. Able to stand for longer amounts of time prior to seated rest. Continued to work on LE functional  strength. Limited time due to being late to her appointment. Patient would benefit from continued management of limiting condition by skilled physical therapist to address remaining impairments and functional limitations to work towards stated goals and return to PLOF or maximal functional independence.    Personal Factors and Comorbidities Age;Comorbidity 3+;Education;Past/Current Experience;Fitness;Time since onset of injury/illness/exacerbation;Social Background    Comorbidities Relevant past medical history and comorbidities include anxiety, bilateral chronic knee pain, arthritis, chronic back pain, GERD, memory changes, sinus bradycardia, urge incontinence, R foot bunionectomy (2013), former smoker.    Examination-Activity Limitations Bed Mobility;Lift;Stairs;Squat;Bend;Locomotion Level;Stand;Caring for Others;Carry;Transfers;Dressing    Examination-Participation Restrictions Laundry;Church;Cleaning;Shop;Community Activity;Meal Prep;Driving;Yard Work    [employer] officer Evolving/Moderate complexity    Rehab Potential Fair    PT Frequency 2x / week    PT Duration 12 weeks    PT Treatment/Interventions ADLs/Self Care Home Management;Aquatic Therapy;Cryotherapy;Moist Heat;Functional mobility training;Stair training;Gait training;DME Instruction;Therapeutic activities;Therapeutic exercise;Balance training;Orthotic Fit/Training;Neuromuscular re-education;Patient/family education;Manual techniques;Passive range of motion;Dry needling;Electrical Stimulation;Energy conservation;Joint Manipulations;Spinal Manipulations;Taping;Splinting    PT Next Visit Plan strengthening/balance exercises    PT Home Exercise Plan [email or web address] Access Code: [email or web address]    Consulted and Agree with Plan of Care Patient           Patient will benefit from skilled therapeutic intervention in order to improve the following deficits and impairments:  Abnormal gait,Decreased knowledge of use of DME,Impaired  sensation,Improper body mechanics,Pain,Decreased coordination,Decreased mobility,Impaired tone,Postural dysfunction,Decreased activity tolerance,Decreased endurance,Decreased range of motion,Decreased strength,Impaired perceived functional ability,Difficulty walking,Decreased balance  Visit Diagnosis: Difficulty in walking, not elsewhere classified  Muscle weakness (generalized)  Other symptoms and signs involving the musculoskeletal system  Bilateral low back pain without sciatica, unspecified chronicity  Unsteadiness on feet  Right knee pain, unspecified chronicity  Left knee pain, unspecified chronicity     Problem List Patient Active Problem List   Diagnosis  Date Noted  . Chronic back pain 08/07/2019  . Depression 08/07/2019  . Near syncope 08/06/2019  . Sinus bradycardia 08/06/2019  . Weakness 11/03/2012    Everlean Alstrom. Graylon Good, PT, DPT 05/02/20, 2:15 PM  [facility] [address], [place], [address] Phone: [phone]   Fax:  [phone]  Name: [name] MRN: [id number] Date of Birth: 1946/03/10

## 2020-05-07 ENCOUNTER — Encounter: Payer: Self-pay | Admitting: Physical Therapy

## 2020-05-07 ENCOUNTER — Other Ambulatory Visit: Payer: Self-pay

## 2020-05-07 ENCOUNTER — Ambulatory Visit: Payer: Medicare Other | Admitting: Physical Therapy

## 2020-05-07 DIAGNOSIS — R2681 Unsteadiness on feet: Secondary | ICD-10-CM | POA: Diagnosis not present

## 2020-05-07 DIAGNOSIS — M545 Low back pain, unspecified: Secondary | ICD-10-CM | POA: Diagnosis not present

## 2020-05-07 DIAGNOSIS — R262 Difficulty in walking, not elsewhere classified: Secondary | ICD-10-CM

## 2020-05-07 DIAGNOSIS — M6281 Muscle weakness (generalized): Secondary | ICD-10-CM | POA: Diagnosis not present

## 2020-05-07 DIAGNOSIS — R29898 Other symptoms and signs involving the musculoskeletal system: Secondary | ICD-10-CM

## 2020-05-07 DIAGNOSIS — M25562 Pain in left knee: Secondary | ICD-10-CM

## 2020-05-07 DIAGNOSIS — M25561 Pain in right knee: Secondary | ICD-10-CM

## 2020-05-07 NOTE — Therapy (Signed)
[facility] [address] [place], [address] Phone: [phone]   Fax:  [phone]  Physical Therapy Treatment  Patient Details  Name: [name] MRN: [id number] Date of Birth: 06/02/1945 Referring Provider (PT): Erline Hau, [place]   Encounter Date: 05/07/2020   PT End of Session - 05/07/20 1303    Visit Number 9    Number of Visits 24    Date for PT Re-Evaluation 06/26/20    Authorization Type Medicare reporting period from 04/03/2020    Progress Note Due on Visit 10    PT Start Time 1303    PT Stop Time 1341    PT Time Calculation (min) 38 min    Equipment Utilized During Treatment Gait belt    Activity Tolerance Patient tolerated treatment well;Patient limited by fatigue    Behavior During Therapy [facility] for tasks assessed/performed           Past Medical History:  Diagnosis Date  . Anxiety   . Arthritis   . Bilateral chronic knee pain   . Chronic back pain   . Depression   . GERD (gastroesophageal reflux disease)   . Insomnia   . Memory changes   . Sinus bradycardia   . Urge incontinence     Past Surgical History:  Procedure Laterality Date  . ABDOMINAL HYSTERECTOMY  1995  . back sugery     lumbar  . BIOPSY THYROID     benign goiter  . BUNIONECTOMY  2013   rt foot  . COLONOSCOPY    . MUSCLE BIOPSY Left 10/03/2012   Procedure: LEFT QUADRICEP MUSCLE BIOPSY;  Surgeon: Odis Hollingshead, MD;  Location: [facility];  Service: General;  Laterality: Left;  . NECK SURGERY  2010   cerv disc fused     There were no vitals filed for this visit.   Subjective Assessment - 05/07/20 1304    Subjective Patient 3 min late. Reports she is feeling well with no pain. She felt okay following last treatment session. has not noticed any changes in her function because she has not been thinking about it. She has been doing a few exercises at home, sit <> stand and walking. Reports she  feels like she is getting stronger and PT is helping her.    Pertinent History Patient is a 75 y.o. female who presents to outpatient physical therapy with a referral for medical diagnosis thoracic spine pain, lumbar spine fusion, generalized weakness. This patient's chief complaints consist of weakness and difficulty moving R leg leading to the following functional deficits: increased difficulty with daily tasks and mobility including walking, stairs, getting in and out of the car, grocery shopping, general mobility, fear of falling.. She ambulates with a single point cane and carbon fiber AFO on the R ankle.  Relevant past medical history and comorbidities include anxiety, bilateral chronic knee pain, arthritis, chronic back pain, GERD, memory changes, sinus bradycardia, urge incontinence, R foot bunionectomy (2013), former smoker.   Patient denies hx of cancer, stroke, seizures, lung problem, major cardiac events, diabetes, unexplained weight loss, changes in bowel or bladder problems, new onset stumbling or dropping things.    Limitations Lifting;Standing;Walking;House hold activities    Diagnostic tests Chest/Lumbar CT report from 01/14/2020: " IMPRESSION:  1. No CT evidence for acute thoracic, abdominal or pelvic injury.  2. No acute fracture involving the thoracic or lumbar spine.  3. Chronic findings as detailed above."  Patient Stated Goals "to get as much out of it as I can to move better"    Currently in Pain? No/denies          OBJECTIVE  FOTO = 45 (05/07/2020);  ABC scale = 32.5 (05/07/2020)   FUNCTIONAL/BALANCE TESTS (R AFO donned throughout): - 6 Minute Walk Test: 562 feet with SPC and CGA - Five Time Sit to Stand (5TSTS): 18 seconds with moderate difficulty from 18.5 inch plinth with B UE support on mat. Mildly unstable.  - 10 Meter Walk Trial: 0.6 meters/second with SPC      TREATMENT: R AFO donned throughout  Therapeutic exercise:to centralize symptoms and improve ROM,  strength, muscular endurance, and activity tolerance required for successful completion of functional activities. - ambulation around clinic x 59min for time using SPC and SBA for safety. (548feet) (seated rest) - sit <> stand x 5 for time from 18.5  Inch plinth.  - ambulation at max speed over 10 meters with SPC and SBA - standing alternating toe taps on 9 inch step with left UE support and R UE support on SPC. 3x10 each side.  - standing hip abduction with BUE support, 3x10, 1 second hold.verbal/tactile cuing - ambulation ~ 100 feet to vehicle with SBA-minA (curb) using SPC.  HOME EXERCISE PROGRAM Access Code: [email or web address] URL: [email or web address] Date: 04/16/2020 Prepared by: Rosita Kea  Exercises Standing Toe Taps - 1 x daily - 3 sets - 10 reps Standing Hip Abduction with Counter Support - 1 x daily - 3 sets - 10 reps Sit to Stand with Armchair - 1 x daily - 3 sets - 5 reps    PT Education - 05/07/20 1338    Education Details Exercise purpose/form. Self management techniques. POC    Person(s) Educated Patient    Methods Explanation;Demonstration;Tactile cues;Verbal cues    Comprehension Verbalized understanding;Returned demonstration;Verbal cues required;Tactile cues required;Need further instruction            PT Short Term Goals - 05/07/20 1319      PT SHORT TERM GOAL #1   Title Be independent with initial home exercise program for self-management of symptoms.    Baseline To be initiated at visit 2 as appropriate (04/03/2020); provided but not participating (04/24/2020); participating in ambulation and sit <> stand (05/07/2020);    Time 2    Period Weeks    Status Partially Met    Target Date 04/17/20             PT Long Term Goals - 05/07/20 1331      PT LONG TERM GOAL #1   Title Be independent with a long-term home exercise program for self-management of symptoms.    Baseline to be updated at visit 2 as appropriate (04/03/2020); partially  participating (05/07/2020);    Time 12    Period Weeks    Status Partially Met   TARGET DATE FOR ALL LONG TERM GOALS: 06/26/2020     PT LONG TERM GOAL #2   Title Demonstrate improved FOTO score to equal or greater than 56 by visit #20 demonstrate improvement in overall condition and self-reported functional ability.    Baseline 48 (04/03/2020); 45 (05/07/2020);    Time 12    Period Weeks    Status On-going      PT LONG TERM GOAL #3   Title Patient will improve her 10 MWT speed with her SPC to at least 1.2 m/s to promote better community ambulation and safe crossing of streets.  Baseline to be measured visit 2 (04/03/2020); 0.54 meters/second with SPC and R AFO (04/16/2020): 0.6 meters/second with SPC (05/07/2020);    Time 12    Period Weeks    Status Partially Met      PT LONG TERM GOAL #4   Title Patient will improve ABC score by 13 percentage points to demonstrate improved self-reported balance.    Baseline 23.8% (04/03/2020); 32.5% (05/07/2020);    Time 12    Period Weeks    Status Partially Met      PT LONG TERM GOAL #5   Title Patient will complete 5 Times Sit to Stand test from chair height without UE support in equal or less than 14 seconds to improve B LE power and strength for transfers and improved mobility, and demonstrate decreased fall risk (threshold between 12 and 15 for increased fall risk).    Baseline 28 seconds with great difficulty from 18.5 inch plinth with B UE support on mat and knees. Very unstable (04/03/2020); 18 seconds with moderate difficulty from 18.5 inch plinth with B UE support on mat. Mildly unstable. (05/07/2020);    Time 12    Period Weeks    Status Partially Met      PT LONG TERM GOAL #6   Title Pateint will improve 6 Minute Walk Test distance to equal or greater than 1000 feet with LRAD to demonstrate improved activity tolerance and endurance for community mobility and participation.    Baseline to be tested visit 2 (04/04/2019); 475 feet with SPC with  SBA. (04/16/2020); 562 feet with SPC and CGA (05/07/2020);    Time 12    Period Weeks    Status Partially Met                 Plan - 05/07/20 1338    Clinical Impression Statement Patient has attended 9 physical therapy sessions and is making gradual progress towards most goals, as expected considering her chronic condition and nature of her comorbid conditions. She has improved her 5TSTS test, 6MWT, ABC score, and 10MWT. Patient presents with continued significant motor control, muscle tone, sensation, activity tolerance, gait pattern, muscle performance (strength/endurance/power), balance impairments that are limiting ability to complete her usual activities including bed mobility, transfers, household and community ambulation, stairs,  grocery shopping and daily chores without difficulty. She demonstrates high fall risk due to her impairments and functional limitations. Patient will benefit from skilled physical therapy intervention to address current body structure impairments and activity limitations to improve function and work towards goals set in current POC in order to return to prior level of function or maximal functional improvement.    Personal Factors and Comorbidities Age;Comorbidity 3+;Education;Past/Current Experience;Fitness;Time since onset of injury/illness/exacerbation;Social Background    Comorbidities Relevant past medical history and comorbidities include anxiety, bilateral chronic knee pain, arthritis, chronic back pain, GERD, memory changes, sinus bradycardia, urge incontinence, R foot bunionectomy (2013), former smoker.    Examination-Activity Limitations Bed Mobility;Lift;Stairs;Squat;Bend;Locomotion Level;Stand;Caring for Others;Carry;Transfers;Dressing    Examination-Participation Restrictions Laundry;Church;Cleaning;Shop;Community Activity;Meal Prep;Driving;Yard Work    [employer] officer Evolving/Moderate complexity    Rehab Potential Fair    PT  Frequency 2x / week    PT Duration 12 weeks    PT Treatment/Interventions ADLs/Self Care Home Management;Aquatic Therapy;Cryotherapy;Moist Heat;Functional mobility training;Stair training;Gait training;DME Instruction;Therapeutic activities;Therapeutic exercise;Balance training;Orthotic Fit/Training;Neuromuscular re-education;Patient/family education;Manual techniques;Passive range of motion;Dry needling;Electrical Stimulation;Energy conservation;Joint Manipulations;Spinal Manipulations;Taping;Splinting    PT Next Visit Plan strengthening/balance exercises    PT Home Exercise Plan [email or web address] Access Code: [email or web address]  Consulted and Agree with Plan of Care Patient           Patient will benefit from skilled therapeutic intervention in order to improve the following deficits and impairments:  Abnormal gait,Decreased knowledge of use of DME,Impaired sensation,Improper body mechanics,Pain,Decreased coordination,Decreased mobility,Impaired tone,Postural dysfunction,Decreased activity tolerance,Decreased endurance,Decreased range of motion,Decreased strength,Impaired perceived functional ability,Difficulty walking,Decreased balance  Visit Diagnosis: Difficulty in walking, not elsewhere classified  Muscle weakness (generalized)  Other symptoms and signs involving the musculoskeletal system  Bilateral low back pain without sciatica, unspecified chronicity  Unsteadiness on feet  Right knee pain, unspecified chronicity  Left knee pain, unspecified chronicity     Problem List Patient Active Problem List   Diagnosis Date Noted  . Chronic back pain 08/07/2019  . Depression 08/07/2019  . Near syncope 08/06/2019  . Sinus bradycardia 08/06/2019  . Weakness 11/03/2012    Everlean Alstrom. Graylon Good, PT, DPT 05/07/20, 1:39 PM  [facility] [address] [place] [address], [place], [address] Phone: [phone]   Fax:  [phone]  Name: [name] MRN: [id number] Date of Birth: [birth year]

## 2020-05-09 ENCOUNTER — Encounter: Payer: Self-pay | Admitting: Physical Therapy

## 2020-05-09 ENCOUNTER — Other Ambulatory Visit: Payer: Self-pay

## 2020-05-09 ENCOUNTER — Ambulatory Visit: Payer: Medicare Other

## 2020-05-09 DIAGNOSIS — R2689 Other abnormalities of gait and mobility: Secondary | ICD-10-CM

## 2020-05-09 DIAGNOSIS — M545 Low back pain, unspecified: Secondary | ICD-10-CM | POA: Diagnosis not present

## 2020-05-09 DIAGNOSIS — M79605 Pain in left leg: Secondary | ICD-10-CM

## 2020-05-09 DIAGNOSIS — M5386 Other specified dorsopathies, lumbar region: Secondary | ICD-10-CM

## 2020-05-09 DIAGNOSIS — R262 Difficulty in walking, not elsewhere classified: Secondary | ICD-10-CM | POA: Diagnosis not present

## 2020-05-09 DIAGNOSIS — R2681 Unsteadiness on feet: Secondary | ICD-10-CM | POA: Diagnosis not present

## 2020-05-09 DIAGNOSIS — M6281 Muscle weakness (generalized): Secondary | ICD-10-CM | POA: Diagnosis not present

## 2020-05-09 DIAGNOSIS — M25561 Pain in right knee: Secondary | ICD-10-CM | POA: Diagnosis not present

## 2020-05-09 DIAGNOSIS — R208 Other disturbances of skin sensation: Secondary | ICD-10-CM

## 2020-05-09 DIAGNOSIS — R29898 Other symptoms and signs involving the musculoskeletal system: Secondary | ICD-10-CM | POA: Diagnosis not present

## 2020-05-09 DIAGNOSIS — M25562 Pain in left knee: Secondary | ICD-10-CM

## 2020-05-09 DIAGNOSIS — M2560 Stiffness of unspecified joint, not elsewhere classified: Secondary | ICD-10-CM

## 2020-05-09 DIAGNOSIS — R269 Unspecified abnormalities of gait and mobility: Secondary | ICD-10-CM

## 2020-05-09 DIAGNOSIS — M79604 Pain in right leg: Secondary | ICD-10-CM

## 2020-05-09 NOTE — Therapy (Signed)
[facility] [address] [place], [address] Phone: [phone]   Fax:  [phone]  Physical Therapy Treatment Physical Therapy Progress Note   Dates of reporting period  04/04/2019   to   05/09/2020   Patient Details  Name: [name] MRN: [id number] Date of Birth: Mar 10, 1946 Referring Provider (PT): Erline Hau, [place]   Encounter Date: 05/09/2020   PT End of Session - 05/09/20 1336    Visit Number 10    Number of Visits 24    Date for PT Re-Evaluation 06/26/20    Authorization Type Medicare reporting period from 04/03/2020    PT Start Time 1305    PT Stop Time 1345    PT Time Calculation (min) 40 min    Equipment Utilized During Treatment Gait belt    Activity Tolerance Patient tolerated treatment well;Patient limited by fatigue    Behavior During Therapy WFL for tasks assessed/performed           Past Medical History:  Diagnosis Date  . Anxiety   . Arthritis   . Bilateral chronic knee pain   . Chronic back pain   . Depression   . GERD (gastroesophageal reflux disease)   . Insomnia   . Memory changes   . Sinus bradycardia   . Urge incontinence     Past Surgical History:  Procedure Laterality Date  . ABDOMINAL HYSTERECTOMY  1995  . back sugery     lumbar  . BIOPSY THYROID     benign goiter  . BUNIONECTOMY  2013   rt foot  . COLONOSCOPY    . MUSCLE BIOPSY Left 10/03/2012   Procedure: LEFT QUADRICEP MUSCLE BIOPSY;  Surgeon: Odis Hollingshead, MD;  Location: [facility];  Service: General;  Laterality: Left;  . NECK SURGERY  2010   cerv disc fused     There were no vitals filed for this visit.   Subjective Assessment - 05/09/20 1306    Subjective Patient reported that she has been since her last PT visit, stated she is tired today, has been moving around a lot.    Pertinent History Patient is a 75 y.o. female who presents to outpatient physical therapy with a  referral for medical diagnosis thoracic spine pain, lumbar spine fusion, generalized weakness. This patient's chief complaints consist of weakness and difficulty moving R leg leading to the following functional deficits: increased difficulty with daily tasks and mobility including walking, stairs, getting in and out of the car, grocery shopping, general mobility, fear of falling.. She ambulates with a single point cane and carbon fiber AFO on the R ankle.  Relevant past medical history and comorbidities include anxiety, bilateral chronic knee pain, arthritis, chronic back pain, GERD, memory changes, sinus bradycardia, urge incontinence, R foot bunionectomy (2013), former smoker.   Patient denies hx of cancer, stroke, seizures, lung problem, major cardiac events, diabetes, unexplained weight loss, changes in bowel or bladder problems, new onset stumbling or dropping things.    Limitations Lifting;Standing;Walking;House hold activities    Diagnostic tests Chest/Lumbar CT report from 01/14/2020: " IMPRESSION:  1. No CT evidence for acute thoracic, abdominal or pelvic injury.  2. No acute fracture involving the thoracic or lumbar spine.  3. Chronic findings as detailed above."    Patient Stated Goals "to get as much out of it as I can to move better"    Currently in Pain? No/denies  TREATMENT:    R AFO donned throughout   Therapeutic exercise: to centralize symptoms and improve ROM, strength, muscular endurance, and activity tolerance required for successful completion of functional activities.    - ambulation around clinic x 76mn for time using SPC and SBA for safety. (one episode of foot catching able to correct modI)  (seated rest)   - standing alternating toe taps on 9 inch step with left UE support and R UE support on SPC. x10 each side.    - standing hip abduction with BUE support, 3x10, 1 second hold. verbal/tactile cuing    - standing mini-squat with BUE support counter  3x10,blue bolster on feet blocking between knees and side of stairs (holding stair rail for UE support), chair behind with large half circle spiked ball for target for buttocks to achieve deeper squat).   (seated rest)   - side stepping alternating directions with BUE and green theraband around knees, 3x10 each way.    - ambulation ~ 100 feet to vehicle with SBA-minA (curb) using SPC.    - sit <> stand x 5 for time from 18.5  Inch plinth.  One UE support   HOME EXERCISE PROGRAM   Access Code: [email or web address]  URL: [email or web address]   Date: 04/16/2020   Prepared by: SRosita Kea   Exercises   Standing Toe Taps - 1 x daily - 3 sets - 10 reps   Standing Hip Abduction with Counter Support - 1 x daily - 3 sets - 10 reps   Sit to Stand with Armchair - 1 x daily - 3 sets - 5 reps      PT Education - 05/09/20 1306    Education Details exercise purpose/form    Person(s) Educated Patient    Methods Explanation;Demonstration;Tactile cues;Verbal cues    Comprehension Verbalized understanding;Returned demonstration;Verbal cues required;Tactile cues required            PT Short Term Goals - 05/07/20 1319      PT SHORT TERM GOAL #1   Title Be independent with initial home exercise program for self-management of symptoms.    Baseline To be initiated at visit 2 as appropriate (04/03/2020); provided but not participating (04/24/2020); participating in ambulation and sit <> stand (05/07/2020);    Time 2    Period Weeks    Status Partially Met    Target Date 04/17/20             PT Long Term Goals - 05/07/20 1331      PT LONG TERM GOAL #1   Title Be independent with a long-term home exercise program for self-management of symptoms.    Baseline to be updated at visit 2 as appropriate (04/03/2020); partially participating (05/07/2020);    Time 12    Period Weeks    Status Partially Met   TARGET DATE FOR ALL LONG TERM GOALS: 06/26/2020     PT LONG TERM GOAL #2    Title Demonstrate improved FOTO score to equal or greater than 56 by visit #20 demonstrate improvement in overall condition and self-reported functional ability.    Baseline 48 (04/03/2020); 45 (05/07/2020);    Time 12    Period Weeks    Status On-going      PT LONG TERM GOAL #3   Title Patient will improve her 10 MWT speed with her SPC to at least 1.2 m/s to promote better community ambulation and safe crossing of streets.    Baseline to be measured visit 2 (  04/03/2020); 0.54 meters/second with SPC and R AFO (04/16/2020): 0.6 meters/second with SPC (05/07/2020);    Time 12    Period Weeks    Status Partially Met      PT LONG TERM GOAL #4   Title Patient will improve ABC score by 13 percentage points to demonstrate improved self-reported balance.    Baseline 23.8% (04/03/2020); 32.5% (05/07/2020);    Time 12    Period Weeks    Status Partially Met      PT LONG TERM GOAL #5   Title Patient will complete 5 Times Sit to Stand test from chair height without UE support in equal or less than 14 seconds to improve B LE power and strength for transfers and improved mobility, and demonstrate decreased fall risk (threshold between 12 and 15 for increased fall risk).    Baseline 28 seconds with great difficulty from 18.5 inch plinth with B UE support on mat and knees. Very unstable (04/03/2020); 18 seconds with moderate difficulty from 18.5 inch plinth with B UE support on mat. Mildly unstable. (05/07/2020);    Time 12    Period Weeks    Status Partially Met      PT LONG TERM GOAL #6   Title Pateint will improve 6 Minute Walk Test distance to equal or greater than 1000 feet with LRAD to demonstrate improved activity tolerance and endurance for community mobility and participation.    Baseline to be tested visit 2 (04/04/2019); 475 feet with SPC with SBA. (04/16/2020); 562 feet with SPC and CGA (05/07/2020);    Time 12    Period Weeks    Status Partially Met                 Plan - 05/09/20 1336     Clinical Impression Statement Pt fatigued this session, exercises modifed accordingly. Attributes fatigue to busy AM as well as increased R knee swelling and pain. Pt cued for exercise technique to maintain form throughout session. Challenged by RLE hip/knee flexion. The patient would benefit from further skilled PT intervention to continue to progress towards goals.    Personal Factors and Comorbidities Age;Comorbidity 3+;Education;Past/Current Experience;Fitness;Time since onset of injury/illness/exacerbation;Social Background    Comorbidities Relevant past medical history and comorbidities include anxiety, bilateral chronic knee pain, arthritis, chronic back pain, GERD, memory changes, sinus bradycardia, urge incontinence, R foot bunionectomy (2013), former smoker.    Examination-Activity Limitations Bed Mobility;Lift;Stairs;Squat;Bend;Locomotion Level;Stand;Caring for Others;Carry;Transfers;Dressing    Examination-Participation Restrictions Laundry;Church;Cleaning;Shop;Community Activity;Meal Prep;Driving;Yard Work    [employer]chant [employer] officer Evolving/Moderate complexity    Rehab Potential Fair    PT Frequency 2x / week    PT Duration 12 weeks    PT Treatment/Interventions ADLs/Self Care Home Management;Aquatic Therapy;Cryotherapy;Moist Heat;Functional mobility training;Stair training;Gait training;DME Instruction;Therapeutic activities;Therapeutic exercise;Balance training;Orthotic Fit/Training;Neuromuscular re-education;Patient/family education;Manual techniques;Passive range of motion;Dry needling;Electrical Stimulation;Energy conservation;Joint Manipulations;Spinal Manipulations;Taping;Splinting    PT Next Visit Plan strengthening/balance exercises    PT Home Exercise Plan [email or web address] Access Code: [email or web address]    Consulted and Agree with Plan of Care Patient           Patient will benefit from skilled therapeutic intervention in order to improve the following deficits and  impairments:  Abnormal gait,Decreased knowledge of use of DME,Impaired sensation,Improper body mechanics,Pain,Decreased coordination,Decreased mobility,Impaired tone,Postural dysfunction,Decreased activity tolerance,Decreased endurance,Decreased range of motion,Decreased strength,Impaired perceived functional ability,Difficulty walking,Decreased balance  Visit Diagnosis: Difficulty in walking, not elsewhere classified  Joint stiffness of spine  Muscle weakness (generalized)  Other symptoms and signs involving the musculoskeletal system  Bilateral low back pain without sciatica, unspecified chronicity  Unsteadiness on feet  Right knee pain, unspecified chronicity  Left knee pain, unspecified chronicity  Pain in right leg  Pain in left leg  Decreased ROM of lumbar spine  Other disturbances of skin sensation  Abnormality of gait  Weakness of both legs  Unsteadiness  Other abnormalities of gait and mobility     Problem List Patient Active Problem List   Diagnosis Date Noted  . Chronic back pain 08/07/2019  . Depression 08/07/2019  . Near syncope 08/06/2019  . Sinus bradycardia 08/06/2019  . Weakness 11/03/2012    Lieutenant Diego PT, DPT 1:39 PM,05/09/20   [facility] [address], [place], [address] Phone: [phone]   Fax:  [phone]  Name: [name] MRN: [id number] Date of Birth: [birth year]

## 2020-05-14 ENCOUNTER — Encounter: Payer: Medicare Other | Admitting: Physical Therapy

## 2020-05-16 ENCOUNTER — Telehealth: Payer: Self-pay | Admitting: Physical Therapy

## 2020-05-16 ENCOUNTER — Ambulatory Visit: Payer: Medicare Other | Admitting: Physical Therapy

## 2020-05-16 NOTE — Telephone Encounter (Signed)
Called patient when she did not show up to her 1pm appointment today. Patient answered and said she did not have it on her schedule and she is in [place] visiting her children since she thought she did not have PT this week. Reminded her we had rescheduled to today since it was my first day back in the office. She apologized for forgetting. Confirmed next appointment Tuesday 05/21/20 at [facility]. Graylon Good, PT, DPT 05/16/20, 1:21 PM

## 2020-05-20 DIAGNOSIS — G4700 Insomnia, unspecified: Secondary | ICD-10-CM | POA: Diagnosis not present

## 2020-05-20 DIAGNOSIS — M170 Bilateral primary osteoarthritis of knee: Secondary | ICD-10-CM | POA: Diagnosis not present

## 2020-05-20 DIAGNOSIS — K219 Gastro-esophageal reflux disease without esophagitis: Secondary | ICD-10-CM | POA: Diagnosis not present

## 2020-05-20 DIAGNOSIS — F419 Anxiety disorder, unspecified: Secondary | ICD-10-CM | POA: Diagnosis not present

## 2020-05-21 ENCOUNTER — Encounter: Payer: Self-pay | Admitting: Physical Therapy

## 2020-05-21 ENCOUNTER — Ambulatory Visit: Payer: Medicare Other | Attending: Physician Assistant | Admitting: Physical Therapy

## 2020-05-21 ENCOUNTER — Other Ambulatory Visit: Payer: Self-pay

## 2020-05-21 DIAGNOSIS — R262 Difficulty in walking, not elsewhere classified: Secondary | ICD-10-CM | POA: Diagnosis not present

## 2020-05-21 DIAGNOSIS — R29898 Other symptoms and signs involving the musculoskeletal system: Secondary | ICD-10-CM | POA: Diagnosis not present

## 2020-05-21 DIAGNOSIS — M545 Low back pain, unspecified: Secondary | ICD-10-CM

## 2020-05-21 DIAGNOSIS — M25561 Pain in right knee: Secondary | ICD-10-CM | POA: Diagnosis not present

## 2020-05-21 DIAGNOSIS — M2560 Stiffness of unspecified joint, not elsewhere classified: Secondary | ICD-10-CM

## 2020-05-21 DIAGNOSIS — R2681 Unsteadiness on feet: Secondary | ICD-10-CM | POA: Diagnosis not present

## 2020-05-21 DIAGNOSIS — M25562 Pain in left knee: Secondary | ICD-10-CM | POA: Diagnosis not present

## 2020-05-21 DIAGNOSIS — M6281 Muscle weakness (generalized): Secondary | ICD-10-CM | POA: Diagnosis not present

## 2020-05-21 NOTE — Therapy (Signed)
[facility] [address], [place], [address] Phone: [phone]   Fax:  [phone]  Physical Therapy Treatment  Patient Details  Name: [name] MRN: [id number] Date of Birth: 16-Sep-1945 Referring Provider (PT): Erline Hau, [place]   Encounter Date: 05/21/2020   PT End of Session - 05/21/20 1627    Visit Number 11    Number of Visits 24    Date for PT Re-Evaluation 06/26/20    Authorization Type Medicare reporting period from 04/08/2020    Progress Note Due on Visit 10    PT Start Time 1306    PT Stop Time 1344    PT Time Calculation (min) 38 min    Equipment Utilized During Treatment Gait belt    Activity Tolerance Patient tolerated treatment well;Patient limited by fatigue;Patient limited by pain    Behavior During Therapy [facility] for tasks assessed/performed           Past Medical History:  Diagnosis Date  . Anxiety   . Arthritis   . Bilateral chronic knee pain   . Chronic back pain   . Depression   . GERD (gastroesophageal reflux disease)   . Insomnia   . Memory changes   . Sinus bradycardia   . Urge incontinence     Past Surgical History:  Procedure Laterality Date  . ABDOMINAL HYSTERECTOMY  1995  . back sugery     lumbar  . BIOPSY THYROID     benign goiter  . BUNIONECTOMY  2013   rt foot  . COLONOSCOPY    . MUSCLE BIOPSY Left 10/03/2012   Procedure: LEFT QUADRICEP MUSCLE BIOPSY;  Surgeon: Odis Hollingshead, MD;  Location: [facility];  Service: General;  Laterality: Left;  . NECK SURGERY  2010   cerv disc fused     There were no vitals filed for this visit.   Subjective Assessment - 05/21/20 1312    Subjective Patient reports she called the doctor that did her surgery at [facility] who said the PT needed to call their office in order to request a new AFO for the R ankle. Patient provided phone number for Edsel Petrin, MD and stated his PA is Aaron Edelman. She states she  is feeling well today except her current AFO is bothering her so she needs to adjust it. She got some new shoes that are not ftting as snug as in the [employer].    Pertinent History Patient is a 75 y.o. female who presents to outpatient physical therapy with a referral for medical diagnosis thoracic spine pain, lumbar spine fusion, generalized weakness. This patient's chief complaints consist of weakness and difficulty moving R leg leading to the following functional deficits: increased difficulty with daily tasks and mobility including walking, stairs, getting in and out of the car, grocery shopping, general mobility, fear of falling.. She ambulates with a single point cane and carbon fiber AFO on the R ankle.  Relevant past medical history and comorbidities include anxiety, bilateral chronic knee pain, arthritis, chronic back pain, GERD, memory changes, sinus bradycardia, urge incontinence, R foot bunionectomy (2013), former smoker.   Patient denies hx of cancer, stroke, seizures, lung problem, major cardiac events, diabetes, unexplained weight loss, changes in bowel or bladder problems, new onset stumbling or dropping things.    Limitations Lifting;Standing;Walking;House hold activities    Diagnostic tests Chest/Lumbar CT report from 01/14/2020: " IMPRESSION:  1. No CT evidence for acute thoracic,  abdominal or pelvic injury.  2. No acute fracture involving the thoracic or lumbar spine.  3. Chronic findings as detailed above."    Patient Stated Goals "to get as much out of it as I can to move better"    Currently in Pain? No/denies            TREATMENT:    R AFO removed at start of session due to pain in   Therapeutic exercise: to centralize symptoms and improve ROM, strength, muscular endurance, and activity tolerance required for successful completion of functional activities.   - ambulation around clinic x 58min for time using SPC and SBA for safety. (56feet) stumbled x3 hooking R toe on  the ground.  (seated rest)  - standing alternating toe taps on 9 inch step with left UE support and R UE support on SPC. 3x10 each side.   - standing hip abduction with BUE support, 3x10, 1 second hold. verbal/tactile cuing   - standing mini-squat with BUE support counter 3x10, blue bolster on feet blocking between knees at side of TM (holding TM rail for UE support), chair behind with large half circle spiked ball for target for buttocks to achieve deeper squat).  - side stepping alternating directions with BUE and green theraband around knees, 3x10 each way.  - ambulation ~ 100 feet to vehicle with SBA-minA (curb) using SPC.   Pt required multimodal cuing for proper technique and to facilitate improved neuromuscular control, strength, range of motion, and functional ability resulting in improved performance and form.  HOME EXERCISE PROGRAM  Access Code: [email or web address]  URL: [email or web address]  Date: 04/16/2020  Prepared by: Rosita Kea   Exercises  Standing Toe Taps - 1 x daily - 3 sets - 10 reps  Standing Hip Abduction with Counter Support - 1 x daily - 3 sets - 10 reps  Sit to Stand with Armchair - 1 x daily - 3 sets - 5 reps      PT Education - 05/21/20 1627    Education Details exercise purpose/form    Person(s) Educated Patient    Methods Explanation;Demonstration;Tactile cues;Verbal cues    Comprehension Verbalized understanding;Returned demonstration;Verbal cues required;Tactile cues required;Need further instruction            PT Short Term Goals - 05/07/20 1319      PT SHORT TERM GOAL #1   Title Be independent with initial home exercise program for self-management of symptoms.    Baseline To be initiated at visit 2 as appropriate (04/03/2020); provided but not participating (04/24/2020); participating in ambulation and sit <> stand (05/07/2020);    Time 2    Period Weeks    Status Partially Met    Target Date 04/17/20             PT Long Term Goals  - 05/07/20 1331      PT LONG TERM GOAL #1   Title Be independent with a long-term home exercise program for self-management of symptoms.    Baseline to be updated at visit 2 as appropriate (04/03/2020); partially participating (05/07/2020);    Time 12    Period Weeks    Status Partially Met   TARGET DATE FOR ALL LONG TERM GOALS: 06/26/2020     PT LONG TERM GOAL #2   Title Demonstrate improved FOTO score to equal or greater than 56 by visit #20 demonstrate improvement in overall condition and self-reported functional ability.    Baseline 48 (04/03/2020); 45 (05/07/2020);    Time 12  Period Weeks    Status On-going      PT LONG TERM GOAL #3   Title Patient will improve her 10 MWT speed with her SPC to at least 1.2 m/s to promote better community ambulation and safe crossing of streets.    Baseline to be measured visit 2 (04/03/2020); 0.54 meters/second with SPC and R AFO (04/16/2020): 0.6 meters/second with SPC (05/07/2020);    Time 12    Period Weeks    Status Partially Met      PT LONG TERM GOAL #4   Title Patient will improve ABC score by 13 percentage points to demonstrate improved self-reported balance.    Baseline 23.8% (04/03/2020); 32.5% (05/07/2020);    Time 12    Period Weeks    Status Partially Met      PT LONG TERM GOAL #5   Title Patient will complete 5 Times Sit to Stand test from chair height without UE support in equal or less than 14 seconds to improve B LE power and strength for transfers and improved mobility, and demonstrate decreased fall risk (threshold between 12 and 15 for increased fall risk).    Baseline 28 seconds with great difficulty from 18.5 inch plinth with B UE support on mat and knees. Very unstable (04/03/2020); 18 seconds with moderate difficulty from 18.5 inch plinth with B UE support on mat. Mildly unstable. (05/07/2020);    Time 12    Period Weeks    Status Partially Met      PT LONG TERM GOAL #6   Title Pateint will improve 6 Minute Walk Test distance  to equal or greater than 1000 feet with LRAD to demonstrate improved activity tolerance and endurance for community mobility and participation.    Baseline to be tested visit 2 (04/04/2019); 475 feet with SPC with SBA. (04/16/2020); 562 feet with SPC and CGA (05/07/2020);    Time 12    Period Weeks    Status Partially Met                 Plan - 05/21/20 1636    Clinical Impression Statement Patient tolerated treatment with some difficulty due to R knee pain. Attempted to call patient's physician about AFO as requested when able later in the day but office was closed. Will try again tomorrow. Patient continues to have R sided weakness and pain in the R knee with squats, so modified as able to make more tolerable. Patient did better with fatigue this session and only needed on seated break (with encouragement to remain standing). Patient would benefit from continued management of limiting condition by skilled physical therapist to address remaining impairments and functional limitations to work towards stated goals and return to PLOF or maximal functional independence.    Personal Factors and Comorbidities Age;Comorbidity 3+;Education;Past/Current Experience;Fitness;Time since onset of injury/illness/exacerbation;Social Background    Comorbidities Relevant past medical history and comorbidities include anxiety, bilateral chronic knee pain, arthritis, chronic back pain, GERD, memory changes, sinus bradycardia, urge incontinence, R foot bunionectomy (2013), former smoker.    Examination-Activity Limitations Bed Mobility;Lift;Stairs;Squat;Bend;Locomotion Level;Stand;Caring for Others;Carry;Transfers;Dressing    Examination-Participation Restrictions Laundry;Church;Cleaning;Shop;Community Activity;Meal Prep;Driving;Yard Work    [employer] Evolving/Moderate complexity    Rehab Potential Fair    PT Frequency 2x / week    PT Duration 12 weeks    PT Treatment/Interventions  ADLs/Self Care Home Management;Aquatic Therapy;Cryotherapy;Moist Heat;Functional mobility training;Stair training;Gait training;DME Instruction;Therapeutic activities;Therapeutic exercise;Balance training;Orthotic Fit/Training;Neuromuscular re-education;Patient/family education;Manual techniques;Passive range of motion;Dry needling;Electrical Stimulation;Energy conservation;Joint Manipulations;Spinal Manipulations;Taping;Splinting  PT Next Visit Plan strengthening/balance exercises    PT Home Exercise Plan [email or web address] Access Code: [email or web address]    Consulted and Agree with Plan of Care Patient           Patient will benefit from skilled therapeutic intervention in order to improve the following deficits and impairments:  Abnormal gait,Decreased knowledge of use of DME,Impaired sensation,Improper body mechanics,Pain,Decreased coordination,Decreased mobility,Impaired tone,Postural dysfunction,Decreased activity tolerance,Decreased endurance,Decreased range of motion,Decreased strength,Impaired perceived functional ability,Difficulty walking,Decreased balance  Visit Diagnosis: Difficulty in walking, not elsewhere classified  Joint stiffness of spine  Muscle weakness (generalized)  Other symptoms and signs involving the musculoskeletal system  Bilateral low back pain without sciatica, unspecified chronicity  Unsteadiness on feet  Right knee pain, unspecified chronicity     Problem List Patient Active Problem List   Diagnosis Date Noted  . Chronic back pain 08/07/2019  . Depression 08/07/2019  . Near syncope 08/06/2019  . Sinus bradycardia 08/06/2019  . Weakness 11/03/2012    Everlean Alstrom. Graylon Good, PT, DPT 05/21/20, 4:38 PM  [facility] [address], [place], [address] Phone: [phone]   Fax:  [phone]  Name: [name] MRN: [id number] Date of Birth: 05-23-1945

## 2020-05-22 ENCOUNTER — Telehealth: Payer: Self-pay | Admitting: Physical Therapy

## 2020-05-22 NOTE — Telephone Encounter (Signed)
Called Dr. Edsel Petrin, MD 's office as requested by patient to discuss need for AFO. Spoke to [name] who said that the doctor wanted to know more about why PT recommends an AFO since they generally do not order these and the recommendation did not come from the physician. 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 She explained that their employees are sometimes listed in epic as orthopedic but they are in fact neurosurgery, which may have caused some confusion. I agreed to outline my recommendations in subsequent note to be faxed to the their direct line [phone]) and send a referral to sign if doctor agrees. As an alternative, plan to speak with patient next visit to see if there is a more appropriate provider to discuss AFO with since neurosurgery does not usually prescribe.   [name]. Graylon Good, PT, DPT 05/22/20, 10:58 AM

## 2020-05-23 ENCOUNTER — Other Ambulatory Visit: Payer: Self-pay

## 2020-05-23 ENCOUNTER — Ambulatory Visit: Payer: Medicare Other | Admitting: Physical Therapy

## 2020-05-23 ENCOUNTER — Encounter: Payer: Self-pay | Admitting: Physical Therapy

## 2020-05-23 DIAGNOSIS — M2560 Stiffness of unspecified joint, not elsewhere classified: Secondary | ICD-10-CM

## 2020-05-23 DIAGNOSIS — M25561 Pain in right knee: Secondary | ICD-10-CM

## 2020-05-23 DIAGNOSIS — R2681 Unsteadiness on feet: Secondary | ICD-10-CM | POA: Diagnosis not present

## 2020-05-23 DIAGNOSIS — R262 Difficulty in walking, not elsewhere classified: Secondary | ICD-10-CM

## 2020-05-23 DIAGNOSIS — M545 Low back pain, unspecified: Secondary | ICD-10-CM | POA: Diagnosis not present

## 2020-05-23 DIAGNOSIS — M6281 Muscle weakness (generalized): Secondary | ICD-10-CM | POA: Diagnosis not present

## 2020-05-23 DIAGNOSIS — R29898 Other symptoms and signs involving the musculoskeletal system: Secondary | ICD-10-CM | POA: Diagnosis not present

## 2020-05-23 NOTE — Therapy (Signed)
[facility] [address] [place], [address] Phone: [phone]   Fax:  [phone]  Physical Therapy Treatment  Patient Details  Name: LANAYA BENNIS MRN: [id number] Date of Birth: 1945-12-11 Referring Provider (PT): Erline Hau, [place]   Encounter Date: 05/23/2020   PT End of Session - 05/23/20 1325    Visit Number 12    Number of Visits 24    Date for PT Re-Evaluation 06/26/20    Authorization Type Medicare reporting period from 04/08/2020    Progress Note Due on Visit 10    PT Start Time 1306    PT Stop Time 1340    PT Time Calculation (min) 34 min    Equipment Utilized During Treatment Gait belt    Activity Tolerance Patient tolerated treatment well;Patient limited by fatigue;Patient limited by pain    Behavior During Therapy [facility] for tasks assessed/performed           Past Medical History:  Diagnosis Date  . Anxiety   . Arthritis   . Bilateral chronic knee pain   . Chronic back pain   . Depression   . GERD (gastroesophageal reflux disease)   . Insomnia   . Memory changes   . Sinus bradycardia   . Urge incontinence     Past Surgical History:  Procedure Laterality Date  . ABDOMINAL HYSTERECTOMY  1995  . back sugery     lumbar  . BIOPSY THYROID     benign goiter  . BUNIONECTOMY  2013   rt foot  . COLONOSCOPY    . MUSCLE BIOPSY Left 10/03/2012   Procedure: LEFT QUADRICEP MUSCLE BIOPSY;  Surgeon: Odis Hollingshead, MD;  Location: [facility];  Service: General;  Laterality: Left;  . NECK SURGERY  2010   cerv disc fused     There were no vitals filed for this visit.   Subjective Assessment - 05/23/20 1318    Subjective Patient reports she is feeling well with no pain upon arrival. States she felt okay following last treatment session. States her R AFO continues to bother her and her new shoes continue to feel more unstable. AFO is no longer hurting so she is continuing to  wear it.  States she thinks she got her AFO 4-5 years ago but it feels much less sturdy now and keeps sliding around. She feels it is not holding her foot up as well as before. Is using her SPC as usual upon arrival.    Pertinent History Patient is a 75 y.o. female who presents to outpatient physical therapy with a referral for medical diagnosis thoracic spine pain, lumbar spine fusion, generalized weakness. This patient's chief complaints consist of weakness and difficulty moving R leg leading to the following functional deficits: increased difficulty with daily tasks and mobility including walking, stairs, getting in and out of the car, grocery shopping, general mobility, fear of falling.. She ambulates with a single point cane and carbon fiber AFO on the R ankle.  Relevant past medical history and comorbidities include anxiety, bilateral chronic knee pain, arthritis, chronic back pain, GERD, memory changes, sinus bradycardia, urge incontinence, R foot bunionectomy (2013), former smoker.   Patient denies hx of cancer, stroke, seizures, lung problem, major cardiac events, diabetes, unexplained weight loss, changes in bowel or bladder problems, new onset stumbling or dropping things.    Limitations Lifting;Standing;Walking;House hold activities    Diagnostic tests Chest/Lumbar CT report from 01/14/2020: " IMPRESSION:  1. No CT evidence for acute thoracic, abdominal or pelvic injury.  2. No acute fracture involving the thoracic or lumbar spine.  3. Chronic findings as detailed above."    Patient Stated Goals "to get as much out of it as I can to move better"    Currently in Pain? No/denies           TREATMENT:   R AFO donned entire session.   Therapeutic exercise: to centralize symptoms and improve ROM, strength, muscular endurance, and activity tolerance required for successful completion of functional activities.  - Treadmill up to 1.3 mph at 0% grade with BUE support and CGA for safety. For  improved lower extremity mobility, muscular endurance, and weightbearing activity tolerance; and to induce the analgesic effect of aerobic exercise, stimulate improved joint nutrition, and prepare body structures and systems for following interventions. x 6  minutes. Required almost constant verbal cues to keep from tripping over R foot which frequently caught or dragged due to poor hip flexion and ankle dorsiflexion.  (seated rest) - standing alternating toe taps on 9 inch step with left UE support and R UE support on SPC. 3x10 each side. (difficulty flexing right hip and DF R ankle to reach step).  - standing hip abduction with BUE support, 3x10, 1 second hold. verbal/tactile cuing (difficulty stabilizing R hip in standing with trendelenburg sign present, difficulty performing exercise, usingexcessive trunk movement).  - side stepping alternating directions with BUE blue and green theraband around knees, 3x10 each way.  (difficulty abducting R > L hip and clearing the floor with R LE).  - seated long arc quad with 10#AW, 3x10 each side (unable to achieve full extension with R knee due to weakness).  - ambulation ~ 100 feet to vehicle with SBA-minA (curb) using SPC.   Pt required multimodal cuing for proper technique and to facilitate improved neuromuscular control, strength, range of motion, and functional ability resulting in improved performance and form.  HOME EXERCISE PROGRAM  Access Code: [email or web address]  URL: [email or web address]  Date: 04/16/2020  Prepared by: Norton Blizzard   Exercises  Standing Toe Taps - 1 x daily - 3 sets - 10 reps  Standing Hip Abduction with Counter Support - 1 x daily - 3 sets - 10 reps  Sit to Stand with Armchair - 1 x daily - 3 sets     PT Education - 05/23/20 1325    Education Details exercise purpose/form. AFO    Person(s) Educated Patient    Methods Explanation;Tactile cues;Demonstration;Verbal cues    Comprehension Verbalized  understanding;Returned demonstration;Verbal cues required;Tactile cues required;Need further instruction            PT Short Term Goals - 05/07/20 1319      PT SHORT TERM GOAL #1   Title Be independent with initial home exercise program for self-management of symptoms.    Baseline To be initiated at visit 2 as appropriate (04/03/2020); provided but not participating (04/24/2020); participating in ambulation and sit <> stand (05/07/2020);    Time 2    Period Weeks    Status Partially Met    Target Date 04/17/20             PT Long Term Goals - 05/07/20 1331      PT LONG TERM GOAL #1   Title Be independent with a long-term home exercise program for self-management of symptoms.    Baseline to be updated at visit 2 as appropriate (04/03/2020); partially participating (05/07/2020);    Time  12    Period Weeks    Status Partially Met   TARGET DATE FOR ALL LONG TERM GOALS: 06/26/2020     PT LONG TERM GOAL #2   Title Demonstrate improved FOTO score to equal or greater than 56 by visit #20 demonstrate improvement in overall condition and self-reported functional ability.    Baseline 48 (04/03/2020); 45 (05/07/2020);    Time 12    Period Weeks    Status On-going      PT LONG TERM GOAL #3   Title Patient will improve her 10 MWT speed with her SPC to at least 1.2 m/s to promote better community ambulation and safe crossing of streets.    Baseline to be measured visit 2 (04/03/2020); 0.54 meters/second with SPC and R AFO (04/16/2020): 0.6 meters/second with SPC (05/07/2020);    Time 12    Period Weeks    Status Partially Met      PT LONG TERM GOAL #4   Title Patient will improve ABC score by 13 percentage points to demonstrate improved self-reported balance.    Baseline 23.8% (04/03/2020); 32.5% (05/07/2020);    Time 12    Period Weeks    Status Partially Met      PT LONG TERM GOAL #5   Title Patient will complete 5 Times Sit to Stand test from chair height without UE support in equal or less  than 14 seconds to improve B LE power and strength for transfers and improved mobility, and demonstrate decreased fall risk (threshold between 12 and 15 for increased fall risk).    Baseline 28 seconds with great difficulty from 18.5 inch plinth with B UE support on mat and knees. Very unstable (04/03/2020); 18 seconds with moderate difficulty from 18.5 inch plinth with B UE support on mat. Mildly unstable. (05/07/2020);    Time 12    Period Weeks    Status Partially Met      PT LONG TERM GOAL #6   Title Pateint will improve 6 Minute Walk Test distance to equal or greater than 1000 feet with LRAD to demonstrate improved activity tolerance and endurance for community mobility and participation.    Baseline to be tested visit 2 (04/04/2019); 475 feet with SPC with SBA. (04/16/2020); 562 feet with SPC and CGA (05/07/2020);    Time 12    Period Weeks    Status Partially Met                 Plan - 05/24/20 1305    Clinical Impression Statement Patient tolerated treatment well overall but required additional encouragement to not take excessive rest breaks this session. Patient has utilized an AFO for several years and she reports she feels it is not stabilizing her ankle well enough and she continues to catch her right foot on things. She also reports the strap is not working properly and she has had to improvise Velcro to keep it on her leg properly. Patient continues to struggle with chronic weakness in the R LE, especially in the area of hip flexion, and dorsiflexion that causes her to use a circumduction gait at times and drag her toe when attempting normal swing through and step up increasing her fall risk. This appears to be caused by chronic radiculopathy. Patient continues to struggle with this despite PT. Plan to assess state of AFO and lower leg in more detail at next visit to address patient's concerns more fully. Patient would benefit from continued management of limiting condition by skilled  physical therapist to address remaining impairments and functional limitations to work towards stated goals and return to PLOF or maximal functional independence.    Personal Factors and Comorbidities Age;Comorbidity 3+;Education;Past/Current Experience;Fitness;Time since onset of injury/illness/exacerbation;Social Background    Comorbidities Relevant past medical history and comorbidities include anxiety, bilateral chronic knee pain, arthritis, chronic back pain, GERD, memory changes, sinus bradycardia, urge incontinence, R foot bunionectomy (2013), former smoker.    Examination-Activity Limitations Bed Mobility;Lift;Stairs;Squat;Bend;Locomotion Level;Stand;Caring for Others;Carry;Transfers;Dressing    Examination-Participation Restrictions Laundry;Church;Cleaning;Shop;Community Activity;Meal Prep;Driving;Yard Work    [employer] Evolving/Moderate complexity    Rehab Potential Fair    PT Frequency 2x / week    PT Duration 12 weeks    PT Treatment/Interventions ADLs/Self Care Home Management;Aquatic Therapy;Cryotherapy;Moist Heat;Functional mobility training;Stair training;Gait training;DME Instruction;Therapeutic activities;Therapeutic exercise;Balance training;Orthotic Fit/Training;Neuromuscular re-education;Patient/family education;Manual techniques;Passive range of motion;Dry needling;Electrical Stimulation;Energy conservation;Joint Manipulations;Spinal Manipulations;Taping;Splinting    PT Next Visit Plan strengthening/balance exercises    PT Home Exercise Plan [email or web address] Access Code: [email or web address]    Consulted and Agree with Plan of Care Patient           Patient will benefit from skilled therapeutic intervention in order to improve the following deficits and impairments:  Abnormal gait,Decreased knowledge of use of DME,Impaired sensation,Improper body mechanics,Pain,Decreased coordination,Decreased mobility,Impaired tone,Postural dysfunction,Decreased activity  tolerance,Decreased endurance,Decreased range of motion,Decreased strength,Impaired perceived functional ability,Difficulty walking,Decreased balance  Visit Diagnosis: Difficulty in walking, not elsewhere classified  Joint stiffness of spine  Muscle weakness (generalized)  Other symptoms and signs involving the musculoskeletal system  Bilateral low back pain without sciatica, unspecified chronicity  Unsteadiness on feet  Right knee pain, unspecified chronicity     Problem List Patient Active Problem List   Diagnosis Date Noted  . Chronic back pain 08/07/2019  . Depression 08/07/2019  . Near syncope 08/06/2019  . Sinus bradycardia 08/06/2019  . Weakness 11/03/2012   Everlean Alstrom. Graylon Good, PT, DPT 05/24/20, 1:06 PM  [facility] [address] [place], [address] Phone: [phone]   Fax:  [phone]  Name: DELANIE TIRRELL MRN: [id number] Date of Birth: 10/06/1945

## 2020-05-28 ENCOUNTER — Ambulatory Visit: Payer: Medicare Other | Admitting: Physical Therapy

## 2020-05-28 ENCOUNTER — Encounter: Payer: Self-pay | Admitting: Physical Therapy

## 2020-05-28 ENCOUNTER — Other Ambulatory Visit: Payer: Self-pay

## 2020-05-28 DIAGNOSIS — M545 Low back pain, unspecified: Secondary | ICD-10-CM

## 2020-05-28 DIAGNOSIS — R2681 Unsteadiness on feet: Secondary | ICD-10-CM | POA: Diagnosis not present

## 2020-05-28 DIAGNOSIS — R262 Difficulty in walking, not elsewhere classified: Secondary | ICD-10-CM

## 2020-05-28 DIAGNOSIS — M2560 Stiffness of unspecified joint, not elsewhere classified: Secondary | ICD-10-CM

## 2020-05-28 DIAGNOSIS — R29898 Other symptoms and signs involving the musculoskeletal system: Secondary | ICD-10-CM | POA: Diagnosis not present

## 2020-05-28 DIAGNOSIS — M6281 Muscle weakness (generalized): Secondary | ICD-10-CM

## 2020-05-28 NOTE — Therapy (Signed)
[facility] [address], [place], [address] Phone: [phone]   Fax:  [phone]  Physical Therapy Treatment  Patient Details  Name: [name] MRN: [id number] Date of Birth: 14-Jul-1945 Referring Provider (PT): Erline Hau, [place]   Encounter Date: 05/28/2020   PT End of Session - 05/28/20 1312    Visit Number 13    Number of Visits 24    Date for PT Re-Evaluation 06/26/20    Authorization Type Medicare reporting period from 04/08/2020    Progress Note Due on Visit 10    PT Start Time 1312    PT Stop Time 1340    PT Time Calculation (min) 28 min    Equipment Utilized During Treatment Gait belt    Activity Tolerance Patient tolerated treatment well;Patient limited by fatigue;Patient limited by pain    Behavior During Therapy [facility] for tasks assessed/performed           Past Medical History:  Diagnosis Date  . Anxiety   . Arthritis   . Bilateral chronic knee pain   . Chronic back pain   . Depression   . GERD (gastroesophageal reflux disease)   . Insomnia   . Memory changes   . Sinus bradycardia   . Urge incontinence     Past Surgical History:  Procedure Laterality Date  . ABDOMINAL HYSTERECTOMY  1995  . back sugery     lumbar  . BIOPSY THYROID     benign goiter  . BUNIONECTOMY  2013   rt foot  . COLONOSCOPY    . MUSCLE BIOPSY Left 10/03/2012   Procedure: LEFT QUADRICEP MUSCLE BIOPSY;  Surgeon: Odis Hollingshead, MD;  Location: [facility];  Service: General;  Laterality: Left;  . NECK SURGERY  2010   cerv disc fused     There were no vitals filed for this visit.   Subjective Assessment - 05/28/20 1313    Subjective Patient reports she is late today because she fell prior to coming. She didn't realize her shoe lace was not tied on the lefit side and she stepped on it. States no one was around and so she layed there a while before she was able to get up. She it her head  on the sofa but it was not hard. She reports she rolled her left ankle into inversion and both shoulders hurt she is unsure which direction she fell. Denies headache. The hardest part was struggling trying to get up which wore her out. Reports she does not have any more pain than usual current. Rates it 5/10 in B legs and knees and neck/shoulder area. It feels kind of stiff. Continues to feel like she needs a new AFO because on the one she currently uses, the foot part that goes into the shoe used to be really stiff but now it slides around. She is unsure how long she has had it but is sure it is more than 74 years old.  When she goes without her AFO she feels like she doesn't have the stability she usually has for mobility and she trips over her R toes a lot. She reportsher R leg became weak following back and neck surgery over 10 years ago. Patient would like new AFO and physician requests further documentation from PT about need for AFO.    Pertinent History Patient is a 75 y.o. female who presents to outpatient physical therapy with a referral for medical  diagnosis thoracic spine pain, lumbar spine fusion, generalized weakness. This patient's chief complaints consist of weakness and difficulty moving R leg leading to the following functional deficits: increased difficulty with daily tasks and mobility including walking, stairs, getting in and out of the car, grocery shopping, general mobility, fear of falling.. She ambulates with a single point cane and carbon fiber AFO on the R ankle.  Relevant past medical history and comorbidities include anxiety, bilateral chronic knee pain, arthritis, chronic back pain, GERD, memory changes, sinus bradycardia, urge incontinence, R foot bunionectomy (2013), former smoker.   Patient denies hx of cancer, stroke, seizures, lung problem, major cardiac events, diabetes, unexplained weight loss, changes in bowel or bladder problems, new onset stumbling or dropping things.     Limitations Lifting;Standing;Walking;House hold activities    Diagnostic tests Chest/Lumbar CT report from 01/14/2020: " IMPRESSION:  1. No CT evidence for acute thoracic, abdominal or pelvic injury.  2. No acute fracture involving the thoracic or lumbar spine.  3. Chronic findings as detailed above."    Patient Stated Goals "to get as much out of it as I can to move better"    Currently in Pain? Yes    Pain Score 5          OBJECTIVE   Examination of R carbon fiber AFO:  - AFO is damaged at the junction between the foot plate and the support rod that comes along the lateral portion of the foot (see pictures). Foot plate with excessive movement in relationship to the support that attaches to lower leg, resulting in inadequate support.    STRENGTH (MMT, * = painful):  Hip  - Flexion: R = 2/5, L = 4+/5. - Extension (prone): R = 4+/5, L = @@/5. (unable to extend of table in prone) - Abduction: R = 2/5, L = 4/5. - ER (prone):  R = 2/5, L = 3+/5. - IR (prone): R = 2/5, L = 4/5. Knee - Ext: R = 3+/5* (missing last few degrees of extension), L = 5/5. - Flex: R = 2+/5, L = 5/5. Ankle (seated position) - Dorsiflexion: R = 2+/5, L = 5/5. - Plantarflexion: R = 2/5, L = 5/5. - Eversion: R = 3/5, L = 5/5. - Great toe extension: R = 3/5, L = 4+/5. Noted for neurological weakness on R LE that gives way with sustained pressure.   GAIT:  Ambulates with SPC in R hand Without R AFO: Ambulates with labored gait with lack of R dorsiflexion, knee flexion, and hip flexion during swing through and catches R toe on floor with one stumble over 50 feet. Noted for significant foot drop contributing to difficulty walking.  With R AFO (broken see above): Continues to have difficulty with R swing through due to decreased hip flexion and knee flexion, but does not catch toe on floor or stumble.  TREATMENT:  Therapeutic exercise: to centralize symptoms and improve ROM, strength, muscular endurance, and activity  tolerance required for successful completion of functional activities.  - assessment of AFO, MMT, and gait to assess need for AFO (see above).   HOME EXERCISE PROGRAM  Access Code: [email or web address]  URL: [email or web address]  Date: 04/16/2020  Prepared by: Rosita Kea   Exercises  Standing Toe Taps - 1 x daily - 3 sets - 10 reps  Standing Hip Abduction with Counter Support - 1 x daily - 3 sets - 10 reps  Sit to Stand with Armchair - 1 x daily - 3 sets  PT Education - 05/28/20 1805    Education Details exercise purpose/form. AFO    Person(s) Educated Patient    Methods Explanation;Demonstration;Tactile cues;Verbal cues    Comprehension Verbalized understanding;Returned demonstration;Verbal cues required;Need further instruction            PT Short Term Goals - 05/07/20 1319      PT SHORT TERM GOAL #1   Title Be independent with initial home exercise program for self-management of symptoms.    Baseline To be initiated at visit 2 as appropriate (04/03/2020); provided but not participating (04/24/2020); participating in ambulation and sit <> stand (05/07/2020);    Time 2    Period Weeks    Status Partially Met    Target Date 04/17/20             PT Long Term Goals - 05/07/20 1331      PT LONG TERM GOAL #1   Title Be independent with a long-term home exercise program for self-management of symptoms.    Baseline to be updated at visit 2 as appropriate (04/03/2020); partially participating (05/07/2020);    Time 12    Period Weeks    Status Partially Met   TARGET DATE FOR ALL LONG TERM GOALS: 06/26/2020     PT LONG TERM GOAL #2   Title Demonstrate improved FOTO score to equal or greater than 56 by visit #20 demonstrate improvement in overall condition and self-reported functional ability.    Baseline 48 (04/03/2020); 45 (05/07/2020);    Time 12    Period Weeks    Status On-going      PT LONG TERM GOAL #3   Title Patient will improve her 10 MWT speed with her SPC to at  least 1.2 m/s to promote better community ambulation and safe crossing of streets.    Baseline to be measured visit 2 (04/03/2020); 0.54 meters/second with SPC and R AFO (04/16/2020): 0.6 meters/second with SPC (05/07/2020);    Time 12    Period Weeks    Status Partially Met      PT LONG TERM GOAL #4   Title Patient will improve ABC score by 13 percentage points to demonstrate improved self-reported balance.    Baseline 23.8% (04/03/2020); 32.5% (05/07/2020);    Time 12    Period Weeks    Status Partially Met      PT LONG TERM GOAL #5   Title Patient will complete 5 Times Sit to Stand test from chair height without UE support in equal or less than 14 seconds to improve B LE power and strength for transfers and improved mobility, and demonstrate decreased fall risk (threshold between 12 and 15 for increased fall risk).    Baseline 28 seconds with great difficulty from 18.5 inch plinth with B UE support on mat and knees. Very unstable (04/03/2020); 18 seconds with moderate difficulty from 18.5 inch plinth with B UE support on mat. Mildly unstable. (05/07/2020);    Time 12    Period Weeks    Status Partially Met      PT LONG TERM GOAL #6   Title Pateint will improve 6 Minute Walk Test distance to equal or greater than 1000 feet with LRAD to demonstrate improved activity tolerance and endurance for community mobility and participation.    Baseline to be tested visit 2 (04/04/2019); 475 feet with SPC with SBA. (04/16/2020); 562 feet with SPC and CGA (05/07/2020);    Time 12    Period Weeks    Status Partially Met  Plan - 05/28/20 1816    Clinical Impression Statement Today's session focused on documenting relevant impairments and functional limitations related to need for new carbon fiber AFO for the right ankle to replace the one she has that is broken. Patient would benefit from new AFO due her current one being broken where the support rod meets the foot plate, allowing  excessive motion resulting in inadequate support. Patient continues to have R sided foot drop that is attributed to chronic radiculopathy in the R LE. She demonstrates improved swing through during ambulation with AFO donned vs without and presents with neurological weakness in the R LE that is helped by the AFO. For these reasons PT recommends new carbon fiber AFO to replace current broken AFO. Patient appears to be without acute concerning injury for fall today, but is expected to be sore.  Patient would benefit from continued management of limiting condition by skilled physical therapist to address remaining impairments and functional limitations to work towards stated goals and return to PLOF or maximal functional independence.    Personal Factors and Comorbidities Age;Comorbidity 3+;Education;Past/Current Experience;Fitness;Time since onset of injury/illness/exacerbation;Social Background    Comorbidities Relevant past medical history and comorbidities include anxiety, bilateral chronic knee pain, arthritis, chronic back pain, GERD, memory changes, sinus bradycardia, urge incontinence, R foot bunionectomy (2013), former smoker.    Examination-Activity Limitations Bed Mobility;Lift;Stairs;Squat;Bend;Locomotion Level;Stand;Caring for Others;Carry;Transfers;Dressing    Examination-Participation Restrictions Laundry;Church;Cleaning;Shop;Community Activity;Meal Prep;Driving;Yard Work    [employer] Evolving/Moderate complexity    Rehab Potential Fair    PT Frequency 2x / week    PT Duration 12 weeks    PT Treatment/Interventions ADLs/Self Care Home Management;Aquatic Therapy;Cryotherapy;Moist Heat;Functional mobility training;Stair training;Gait training;DME Instruction;Therapeutic activities;Therapeutic exercise;Balance training;Orthotic Fit/Training;Neuromuscular re-education;Patient/family education;Manual techniques;Passive range of motion;Dry needling;Electrical  Stimulation;Energy conservation;Joint Manipulations;Spinal Manipulations;Taping;Splinting    PT Next Visit Plan strengthening/balance exercises    PT Home Exercise Plan [email or web address] Access Code: [email or web address]    Recommended Other Services New R carbon fiber AFO to replace her broken one.    Consulted and Agree with Plan of Care Patient           Patient will benefit from skilled therapeutic intervention in order to improve the following deficits and impairments:  Abnormal gait,Decreased knowledge of use of DME,Impaired sensation,Improper body mechanics,Pain,Decreased coordination,Decreased mobility,Impaired tone,Postural dysfunction,Decreased activity tolerance,Decreased endurance,Decreased range of motion,Decreased strength,Impaired perceived functional ability,Difficulty walking,Decreased balance  Visit Diagnosis: Difficulty in walking, not elsewhere classified  Joint stiffness of spine  Muscle weakness (generalized)  Other symptoms and signs involving the musculoskeletal system  Bilateral low back pain without sciatica, unspecified chronicity  Unsteadiness on feet     Problem List Patient Active Problem List   Diagnosis Date Noted  . Chronic back pain 08/07/2019  . Depression 08/07/2019  . Near syncope 08/06/2019  . Sinus bradycardia 08/06/2019  . Weakness 11/03/2012    Everlean Alstrom. Graylon Good, PT, DPT 05/28/20, 6:17 PM  [facility] [address], [place], [address] Phone: [phone]   Fax:  [phone]  Name: [name] MRN: [id number] Date of Birth: Jan 30, 1946

## 2020-05-28 NOTE — Progress Notes (Signed)
Pictures of broken R AFO from visit on 05/28/2020.   Everlean Alstrom. Graylon Good, PT, DPT 05/28/20, 6:18 PM

## 2020-05-30 ENCOUNTER — Encounter: Payer: Self-pay | Admitting: Physical Therapy

## 2020-05-30 ENCOUNTER — Other Ambulatory Visit: Payer: Self-pay

## 2020-05-30 ENCOUNTER — Ambulatory Visit: Payer: Medicare Other | Admitting: Physical Therapy

## 2020-05-30 DIAGNOSIS — M2560 Stiffness of unspecified joint, not elsewhere classified: Secondary | ICD-10-CM | POA: Diagnosis not present

## 2020-05-30 DIAGNOSIS — M6281 Muscle weakness (generalized): Secondary | ICD-10-CM

## 2020-05-30 DIAGNOSIS — R262 Difficulty in walking, not elsewhere classified: Secondary | ICD-10-CM

## 2020-05-30 DIAGNOSIS — R29898 Other symptoms and signs involving the musculoskeletal system: Secondary | ICD-10-CM | POA: Diagnosis not present

## 2020-05-30 DIAGNOSIS — M545 Low back pain, unspecified: Secondary | ICD-10-CM

## 2020-05-30 DIAGNOSIS — M25561 Pain in right knee: Secondary | ICD-10-CM

## 2020-05-30 DIAGNOSIS — R2681 Unsteadiness on feet: Secondary | ICD-10-CM | POA: Diagnosis not present

## 2020-05-30 NOTE — Therapy (Signed)
[facility] [address], [place], [address] Phone: [phone]   Fax:  [phone]  Physical Therapy Treatment  Patient Details  Name: [name] MRN: [id number] Date of Birth: Jul 23, 1945 Referring Provider (PT): Erline Hau, [place]   Encounter Date: 05/30/2020   PT End of Session - 05/30/20 1803    Visit Number 14    Number of Visits 24    Date for PT Re-Evaluation 06/26/20    Authorization Type Medicare reporting period from 04/08/2020    Progress Note Due on Visit 10    PT Start Time 1345    PT Stop Time 1425    PT Time Calculation (min) 40 min    Equipment Utilized During Treatment Gait belt    Activity Tolerance Patient tolerated treatment well;Patient limited by fatigue;Patient limited by pain    Behavior During Therapy [facility] for tasks assessed/performed           Past Medical History:  Diagnosis Date  . Anxiety   . Arthritis   . Bilateral chronic knee pain   . Chronic back pain   . Depression   . GERD (gastroesophageal reflux disease)   . Insomnia   . Memory changes   . Sinus bradycardia   . Urge incontinence     Past Surgical History:  Procedure Laterality Date  . ABDOMINAL HYSTERECTOMY  1995  . back sugery     lumbar  . BIOPSY THYROID     benign goiter  . BUNIONECTOMY  2013   rt foot  . COLONOSCOPY    . MUSCLE BIOPSY Left 10/03/2012   Procedure: LEFT QUADRICEP MUSCLE BIOPSY;  Surgeon: Odis Hollingshead, MD;  Location: [facility];  Service: General;  Laterality: Left;  . NECK SURGERY  2010   cerv disc fused     There were no vitals filed for this visit.   Subjective Assessment - 05/30/20 1802    Subjective Patient reports no pain upon arrival today and no falls since prior to last treatment session. States she was sore yesterday. Stopped using her R AFO due to pinching where it is broken. States her doctor's office called and said an order for a new AFO is in  the mail on the way to her.    Pertinent History Patient is a 75 y.o. female who presents to outpatient physical therapy with a referral for medical diagnosis thoracic spine pain, lumbar spine fusion, generalized weakness. This patient's chief complaints consist of weakness and difficulty moving R leg leading to the following functional deficits: increased difficulty with daily tasks and mobility including walking, stairs, getting in and out of the car, grocery shopping, general mobility, fear of falling.. She ambulates with a single point cane and carbon fiber AFO on the R ankle.  Relevant past medical history and comorbidities include anxiety, bilateral chronic knee pain, arthritis, chronic back pain, GERD, memory changes, sinus bradycardia, urge incontinence, R foot bunionectomy (2013), former smoker.   Patient denies hx of cancer, stroke, seizures, lung problem, major cardiac events, diabetes, unexplained weight loss, changes in bowel or bladder problems, new onset stumbling or dropping things.    Limitations Lifting;Standing;Walking;House hold activities    Diagnostic tests Chest/Lumbar CT report from 01/14/2020: " IMPRESSION:  1. No CT evidence for acute thoracic, abdominal or pelvic injury.  2. No acute fracture involving the thoracic or lumbar spine.  3. Chronic findings as detailed above."    Patient Stated Goals "  to get as much out of it as I can to move better"    Currently in Pain? No/denies           TREATMENT:   R AFOdoffed entire session.   Therapeutic exercise: to centralize symptoms and improve ROM, strength, muscular endurance, and activity tolerance required for successful completion of functional activities. - ambulation around clinic with [facility] with SBA for safety. One stumble. 6 min. 473 feet.  (seated rest) - standing alternating toe taps on 9 inch step with left UE support and R UE support on SPC.3x10 each side. (difficulty flexing right hip and DF R ankle to reach  step).  - sidelying R hip abduction with tactile cues to keep pelvis still, 2x10 - sidelying R clam shell AAROM with more assistance for concentric phase, 2x10 - supine R quad set over half foam roll, AAROM -> AROM, 2x10 - hooklying R SLR, 2x10 - supine short arc quad over maroon bolster, 2x10 (10# AW on L, AROM on R). Attempted with weight on R but unable to complete full arc.  - ambulation ~ 100 feet to vehicle with SBA-minA (curb) using SPC.   Pt required multimodal cuing for proper technique and to facilitate improved neuromuscular control, strength, range of motion, and functional ability resulting in improved performance and form.  HOME EXERCISE PROGRAM  Access Code: [email or web address]  URL: [email or web address]  Date: 04/16/2020  Prepared by: Rosita Kea   Exercises  Standing Toe Taps - 1 x daily - 3 sets - 10 reps  Standing Hip Abduction with Counter Support - 1 x daily - 3 sets - 10 reps  Sit to Stand with Armchair - 1 x daily - 3 sets     PT Education - 05/30/20 1803    Education Details exercise purpose/form.    Person(s) Educated Patient    Methods Explanation;Demonstration;Tactile cues;Verbal cues    Comprehension Verbalized understanding;Returned demonstration;Verbal cues required;Tactile cues required;Need further instruction            PT Short Term Goals - 05/07/20 1319      PT SHORT TERM GOAL #1   Title Be independent with initial home exercise program for self-management of symptoms.    Baseline To be initiated at visit 2 as appropriate (04/03/2020); provided but not participating (04/24/2020); participating in ambulation and sit <> stand (05/07/2020);    Time 2    Period Weeks    Status Partially Met    Target Date 04/17/20             PT Long Term Goals - 05/07/20 1331      PT LONG TERM GOAL #1   Title Be independent with a long-term home exercise program for self-management of symptoms.    Baseline to be updated at visit 2 as appropriate  (04/03/2020); partially participating (05/07/2020);    Time 12    Period Weeks    Status Partially Met   TARGET DATE FOR ALL LONG TERM GOALS: 06/26/2020     PT LONG TERM GOAL #2   Title Demonstrate improved FOTO score to equal or greater than 56 by visit #20 demonstrate improvement in overall condition and self-reported functional ability.    Baseline 48 (04/03/2020); 45 (05/07/2020);    Time 12    Period Weeks    Status On-going      PT LONG TERM GOAL #3   Title Patient will improve her 10 MWT speed with her SPC to at least 1.2 m/s to promote better community ambulation  and safe crossing of streets.    Baseline to be measured visit 2 (04/03/2020); 0.54 meters/second with SPC and R AFO (04/16/2020): 0.6 meters/second with SPC (05/07/2020);    Time 12    Period Weeks    Status Partially Met      PT LONG TERM GOAL #4   Title Patient will improve ABC score by 13 percentage points to demonstrate improved self-reported balance.    Baseline 23.8% (04/03/2020); 32.5% (05/07/2020);    Time 12    Period Weeks    Status Partially Met      PT LONG TERM GOAL #5   Title Patient will complete 5 Times Sit to Stand test from chair height without UE support in equal or less than 14 seconds to improve B LE power and strength for transfers and improved mobility, and demonstrate decreased fall risk (threshold between 12 and 15 for increased fall risk).    Baseline 28 seconds with great difficulty from 18.5 inch plinth with B UE support on mat and knees. Very unstable (04/03/2020); 18 seconds with moderate difficulty from 18.5 inch plinth with B UE support on mat. Mildly unstable. (05/07/2020);    Time 12    Period Weeks    Status Partially Met      PT LONG TERM GOAL #6   Title Pateint will improve 6 Minute Walk Test distance to equal or greater than 1000 feet with LRAD to demonstrate improved activity tolerance and endurance for community mobility and participation.    Baseline to be tested visit 2 (04/04/2019);  475 feet with SPC with SBA. (04/16/2020); 562 feet with SPC and CGA (05/07/2020);    Time 12    Period Weeks    Status Partially Met                 Plan - 05/30/20 1808    Clinical Impression Statement Patient tolerated treatment well but reported feeling exhausted by end of session. Patient has great difficulty activating muscles in R LE in ranges and positions required by exercises today. Used table exercises to help isolate weak muscles and prevent excessive compensation. Patient would benefit from continued management of limiting condition by skilled physical therapist to address remaining impairments and functional limitations to work towards stated goals and return to PLOF or maximal functional independence.    Personal Factors and Comorbidities Age;Comorbidity 3+;Education;Past/Current Experience;Fitness;Time since onset of injury/illness/exacerbation;Social Background    Comorbidities Relevant past medical history and comorbidities include anxiety, bilateral chronic knee pain, arthritis, chronic back pain, GERD, memory changes, sinus bradycardia, urge incontinence, R foot bunionectomy (2013), former smoker.    Examination-Activity Limitations Bed Mobility;Lift;Stairs;Squat;Bend;Locomotion Level;Stand;Caring for Others;Carry;Transfers;Dressing    Examination-Participation Restrictions Laundry;Church;Cleaning;Shop;Community Activity;Meal Prep;Driving;Yard Work    [employer] Evolving/Moderate complexity    Rehab Potential Fair    PT Frequency 2x / week    PT Duration 12 weeks    PT Treatment/Interventions ADLs/Self Care Home Management;Aquatic Therapy;Cryotherapy;Moist Heat;Functional mobility training;Stair training;Gait training;DME Instruction;Therapeutic activities;Therapeutic exercise;Balance training;Orthotic Fit/Training;Neuromuscular re-education;Patient/family education;Manual techniques;Passive range of motion;Dry needling;Electrical Stimulation;Energy  conservation;Joint Manipulations;Spinal Manipulations;Taping;Splinting    PT Next Visit Plan strengthening/balance exercises    PT Home Exercise Plan [email or web address] Access Code: [email or web address]    Consulted and Agree with Plan of Care Patient           Patient will benefit from skilled therapeutic intervention in order to improve the following deficits and impairments:  Abnormal gait,Decreased knowledge of use of DME,Impaired sensation,Improper body mechanics,Pain,Decreased coordination,Decreased mobility,Impaired tone,Postural dysfunction,Decreased activity tolerance,Decreased endurance,Decreased  range of motion,Decreased strength,Impaired perceived functional ability,Difficulty walking,Decreased balance  Visit Diagnosis: Difficulty in walking, not elsewhere classified  Joint stiffness of spine  Muscle weakness (generalized)  Other symptoms and signs involving the musculoskeletal system  Bilateral low back pain without sciatica, unspecified chronicity  Unsteadiness on feet  Right knee pain, unspecified chronicity     Problem List Patient Active Problem List   Diagnosis Date Noted  . Chronic back pain 08/07/2019  . Depression 08/07/2019  . Near syncope 08/06/2019  . Sinus bradycardia 08/06/2019  . Weakness 11/03/2012    Everlean Alstrom. Graylon Good, PT, DPT 05/30/20, 6:10 PM  [facility] [address] [place], [address] Phone: [phone]   Fax:  [phone]  Name: PAZ FUENTES MRN: [id number] Date of Birth: 1945-11-15

## 2020-06-04 ENCOUNTER — Ambulatory Visit: Payer: Medicare Other | Admitting: Neurology

## 2020-06-05 ENCOUNTER — Ambulatory Visit: Payer: Medicare Other | Admitting: Physical Therapy

## 2020-06-07 ENCOUNTER — Encounter: Payer: Self-pay | Admitting: *Deleted

## 2020-06-10 ENCOUNTER — Encounter: Payer: Self-pay | Admitting: Psychology

## 2020-06-10 ENCOUNTER — Ambulatory Visit (INDEPENDENT_AMBULATORY_CARE_PROVIDER_SITE_OTHER): Payer: Medicare Other | Admitting: Diagnostic Neuroimaging

## 2020-06-10 ENCOUNTER — Telehealth: Payer: Self-pay | Admitting: Diagnostic Neuroimaging

## 2020-06-10 ENCOUNTER — Encounter: Payer: Self-pay | Admitting: Diagnostic Neuroimaging

## 2020-06-10 VITALS — BP 129/70 | HR 57 | Ht 69.0 in | Wt 213.0 lb

## 2020-06-10 DIAGNOSIS — G3184 Mild cognitive impairment, so stated: Secondary | ICD-10-CM | POA: Diagnosis not present

## 2020-06-10 DIAGNOSIS — F32A Depression, unspecified: Secondary | ICD-10-CM

## 2020-06-10 DIAGNOSIS — F419 Anxiety disorder, unspecified: Secondary | ICD-10-CM

## 2020-06-10 NOTE — Patient Instructions (Signed)
MILD MEMORY LOSS (MCI vs stress related vs insomnia / anxiety) - refer to neuropsychology testing - stop donepezil  ANXIETY / INSOMNIA / DEPRESSION - consider psychiatry / psychology eval for insomnia / anxiety / depression

## 2020-06-10 NOTE — Telephone Encounter (Signed)
medicare/aarp/champ [place] order sent to [facility] . No auth they will reach out to the patient to schedule.

## 2020-06-10 NOTE — Progress Notes (Signed)
[facility]  PATIENT: [name] DOB: [birth year]  REFERRING CLINICIAN: Janie Morning, DO HISTORY FROM: patient and husband REASON FOR VISIT: new consult   HISTORICAL  CHIEF COMPLAINT:  Chief Complaint  Patient presents with  . Cognitive decline    Rm 7 New pt, husband- Elijah  MMSE 22    HISTORY OF PRESENT ILLNESS:   UPDATE (06/10/20, VRP): 75 year old female here for evaluation memory loss.  Patient reports at least 1 to 2 years of short-term memory loss and forgetfulness.  This is been noticed by patient and husband.  No major changes in ADLs.  Patient had some cognitive testing with PCP, and she scored below expectation.  She was started on donepezil.  Also having significant issues with anxiety, stress, depression and insomnia.    UPDATE 12/05/12: Since last visit, had MRI brain and t-spine, LP, and lab testing, which was reviewed with patient and family. Patient feels arms and legs are getting weaker and weaker. Having to hold on to the wall. Worried about falling down.  PRIOR HPI (11/03/12): 75 year old right-handed female here for evaluation of muscle weakness.  April 2014 patient began to feel tired and heaviness in her hips and thighs. She was evaluated by PCP and rheumatology and worked up for possible myopathy/polymyositis. CK level was mildly elevated. EMG and muscle biopsy were negative. Patient was treated empirically with prednisone without relief. By June, July 2014 she progressively worsened and developed tingling in her toes. Over the past 3 weeks she's developed weakness in her hands and arms. No bowel or bladder dysfunction. She's having trouble walking and falling down frequently. She's not using a cane or walker. No blurred vision, double vision, drooping eyelids, slurred speech, trouble swallowing or choking. No shortness of breath or chest pain.  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 Patient referred for further evaluation. She has a followup with neurosurgery next week.  REVIEW OF SYSTEMS: Full 14 system review of systems performed and notable only for numbness weakness snoring decreased energy fatigue rash.  ALLERGIES: Allergies  Allergen Reactions  . Oxycontin [Oxycodone] Nausea Only  . Gadolinium Derivatives Swelling and Rash    Patient reported having rash on face and swelling of eyes the day after administration of MRI contrast on two occasions.      HOME MEDICATIONS:  Outpatient Medications Prior to Visit  Medication Sig Dispense Refill  . donepezil (ARICEPT) 10 MG tablet Take 10 mg by mouth at bedtime.    . pantoprazole (PROTONIX) 40 MG tablet Take 1 tablet (40 mg total) by mouth 2 (two) times daily. 60 tablet 0  . venlafaxine XR (EFFEXOR-XR) 75 MG 24 hr capsule Take 75 mg by mouth daily with breakfast.    . clonazePAM (KLONOPIN) 1 MG tablet Take 1 mg by mouth at bedtime.  (Patient not taking: Reported on 06/10/2020)    . cyclobenzaprine (FLEXERIL) 5 MG tablet Take 2 tablets (10 mg total) by mouth 2 (two) times daily as needed for muscle spasms. (Patient not taking: No sig reported) 10 tablet 0  . mirabegron ER (MYRBETRIQ) 25 MG TB24 tablet Take 1 tablet (25 mg total) by mouth daily. (Patient not taking: Reported on 06/10/2020) 30 tablet 0   No facility-administered medications prior to visit.    PAST MEDICAL HISTORY: Past Medical History:  Diagnosis Date  . Anxiety   . Arthritis   .  Bilateral chronic knee pain   . Chronic back pain   . Depression   . GERD (gastroesophageal reflux disease)   . Insomnia   . Memory changes   . Sinus bradycardia   . Urge incontinence     PAST SURGICAL HISTORY: Past Surgical History:  Procedure Laterality Date  . ABDOMINAL HYSTERECTOMY  1995  . back sugery     lumbar  . BIOPSY THYROID     benign goiter  . BUNIONECTOMY  2013   rt foot  . COLONOSCOPY    . MUSCLE  BIOPSY Left 10/03/2012   Procedure: LEFT QUADRICEP MUSCLE BIOPSY;  Surgeon: Odis Hollingshead, MD;  Location: [facility];  Service: General;  Laterality: Left;  . NECK SURGERY  2010   cerv disc fused     FAMILY HISTORY: Family History  Problem Relation Age of Onset  . Hypertension Mother   . Dementia Mother   . Cancer Sister   . Diabetes Sister   . Diabetes Brother   . Arthritis/Rheumatoid Sister     SOCIAL HISTORY:  Social History   Socioeconomic History  . Marital status: Married    Spouse name: Not on file  . Number of children: 2  . Years of education: Not on file  . Highest education level: Some college, no degree  Occupational History    Comment: retired  Tobacco Use  . Smoking status: Former Smoker    Quit date: 03/23/2001    Years since quitting: 19.2  . Smokeless tobacco: Never Used  Substance and Sexual Activity  . Alcohol use: No  . Drug use: No  . Sexual activity: Not on file  Other Topics Concern  . Not on file  Social History Narrative   06/10/20  lives with spouse   'no caffeine   Social Determinants of Health   Financial Resource Strain: Not on file  Food Insecurity: Not on file  Transportation Needs: Not on file  Physical Activity: Not on file  Stress: Not on file  Social Connections: Not on file  Intimate Partner Violence: Not on file     PHYSICAL EXAM  GENERAL EXAM/CONSTITUTIONAL: Vitals:  Vitals:   06/10/20 1125  BP: 129/70  Pulse: (!) 57  Weight: 213 lb (96.6 kg)  Height: 5\' 9"  (1.753 m)   Body mass index is 31.45 kg/m. Wt Readings from Last 3 Encounters:  06/10/20 213 lb (96.6 kg)  02/29/20 210 lb (95.3 kg)  01/14/20 222 lb 10.6 oz (101 kg)    Patient is in no distress; well developed, nourished and groomed; neck is supple  CARDIOVASCULAR:  Examination of carotid arteries is normal; no carotid bruits  Regular rate and rhythm, no murmurs  Examination of peripheral vascular system by observation and  palpation is normal  EYES:  Ophthalmoscopic exam of optic discs and posterior segments is normal; no papilledema or hemorrhages No exam data present  MUSCULOSKELETAL:  Gait, strength, tone, movements noted in Neurologic exam below  NEUROLOGIC: MENTAL STATUS:  MMSE - Mini Mental State Exam 06/10/2020  Orientation to time 5  Orientation to Place 4  Registration 3  Attention/ Calculation 2  Recall 0  Language- name 2 objects 2  Language- repeat 0  Language- follow 3 step command 3  Language- read & follow direction 1  Write a sentence 1  Copy design 1  Total score 22    awake, alert, oriented to person, place and time  recent and remote memory intact  normal attention and  concentration  language fluent, comprehension intact, naming intact  fund of knowledge appropriate  CRANIAL NERVE:   2nd - no papilledema on fundoscopic exam  2nd, 3rd, 4th, 6th - pupils equal and reactive to light, visual fields full to confrontation, extraocular muscles intact, no nystagmus  5th - facial sensation symmetric  7th - facial strength symmetric  8th - hearing intact  9th - palate elevates symmetrically, uvula midline  11th - shoulder shrug symmetric  12th - tongue protrusion midline  MOTOR:   normal bulk and tone  BUE 4+ PROX, 5 DISTAL  RLE 1-2 PROX, 5 DISTAL  LLE 5  SENSORY:   normal and symmetric to light touch  COORDINATION:   finger-nose-finger, fine finger movements normal  REFLEXES:   deep tendon reflexes TRACE and symmetric  GAIT/STATION:   IN WHEEL CHAIR     DIAGNOSTIC DATA (LABS, IMAGING, TESTING) - I reviewed patient records, labs, notes, testing and imaging myself where available.  Lab Results  Component Value Date   WBC 6.1 01/14/2020   HGB 13.4 01/14/2020   HCT 42.2 01/14/2020   MCV 94.2 01/14/2020   PLT 194 01/14/2020      Component Value Date/Time   NA 144 01/14/2020 1756   K 3.9 01/14/2020 1756   CL 110 01/14/2020 1756    CO2 27 01/14/2020 1756   GLUCOSE 77 01/14/2020 1756   BUN 14 01/14/2020 1756   CREATININE 1.26 (H) 01/14/2020 1756   CALCIUM 9.0 01/14/2020 1756   PROT 5.4 (L) 01/14/2020 1756   ALBUMIN 3.1 (L) 01/14/2020 1756   AST 21 01/14/2020 1756   ALT 19 01/14/2020 1756   ALKPHOS 67 01/14/2020 1756   BILITOT 0.8 01/14/2020 1756   GFRNONAA 45 (L) 01/14/2020 1756   GFRAA >60 08/09/2019 0243   No results found for: CHOL No results found for: HGBA1C No results found for: VITAMINB12 Lab Results  Component Value Date   TSH 1.837 08/06/2019    06/22/12 MRI BRAIN - few non-specific periventricular and subcortical foci of gliosis  10/03/12 left quadriceps biopsy - VERY MILD NONSPECIFIC MYOFIBER ATROPHY IN SKELETAL MUSCLE SAMPLE WITHOUT MYOFIBER NECROSIS OR INFLAMMATION  10/27/12 MRI CERVICAL - 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 ACDF C5-C7.  11/15/12 MRI thoracic spine - spondylitic change at T11-12 with broad-based disc osteophyte protrusion and facet hypertrophy resulting in mild cord compression and subtle cord signal abnormality and bilateral foraminal stenosis. No definite demyelinating lesions are noted.  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 Overall no significant change compared with the MRI dated 06/22/2012  CSF studies: WBC 1, RBC 395, protein 69 (h), glucose 52, oligoclonal bands negative, HSV neg, ACE 7, gram stain and culture neg.  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 year old female here with:  Dx:  1. MCI (mild cognitive impairment)   2. Anxiety   3. Depression, unspecified depression type      PLAN:  MILD MEMORY LOSS (MCI vs stress related vs insomnia / anxiety; no major changes in ADLs) - check MRI brain - refer to neuropsychology testing - stop  donepezil  ANXIETY / INSOMNIA / DEPRESSION - consider psychiatry / psychology eval for insomnia / anxiety / depression  Orders Placed This Encounter  Procedures  . MR BRAIN WO CONTRAST  . Ambulatory referral to  Neuropsychology   Return in about 1 year (around 06/10/2021).    Penni Bombard, MD 1/84/8592, 76:39 PM Certified in Neurology, Neurophysiology and Neuroimaging  [facility] [address], [place], [place] [address] [phone]

## 2020-06-13 ENCOUNTER — Ambulatory Visit: Payer: Medicare Other | Admitting: Physical Therapy

## 2020-06-13 ENCOUNTER — Telehealth: Payer: Self-pay | Admitting: Physical Therapy

## 2020-06-13 NOTE — Telephone Encounter (Addendum)
Called patient when she did not come to her 1:45pm appointment today. Patient answered and said she did not have the appointment on her schedule but that she may have her schedules mixed up. She was not at home and wanted to call back once she got home to get her schedule straight. Let her know the next one scheduled was 2:30pm on Tuesday 06/18/2020 (unsure if she had a way to write this down to remember).   Everlean Alstrom. Graylon Good, PT, DPT 06/13/20, 2:10 PM   Called patient back to let her know I was in error when I told her the next scheduled visit is 2:30pm next Tuesday. Spoke to her and told her it is actually at 1:45pm Tuesday 06/18/2020. Patient stated she understands.   Everlean Alstrom. Graylon Good, PT, DPT 06/13/20, 2:15 PM

## 2020-06-18 ENCOUNTER — Other Ambulatory Visit: Payer: Self-pay

## 2020-06-18 ENCOUNTER — Encounter: Payer: Self-pay | Admitting: Physical Therapy

## 2020-06-18 ENCOUNTER — Ambulatory Visit: Payer: Medicare Other | Admitting: Physical Therapy

## 2020-06-18 DIAGNOSIS — R29898 Other symptoms and signs involving the musculoskeletal system: Secondary | ICD-10-CM | POA: Diagnosis not present

## 2020-06-18 DIAGNOSIS — M545 Low back pain, unspecified: Secondary | ICD-10-CM

## 2020-06-18 DIAGNOSIS — M6281 Muscle weakness (generalized): Secondary | ICD-10-CM | POA: Diagnosis not present

## 2020-06-18 DIAGNOSIS — M25562 Pain in left knee: Secondary | ICD-10-CM

## 2020-06-18 DIAGNOSIS — R2681 Unsteadiness on feet: Secondary | ICD-10-CM

## 2020-06-18 DIAGNOSIS — M25561 Pain in right knee: Secondary | ICD-10-CM

## 2020-06-18 DIAGNOSIS — R262 Difficulty in walking, not elsewhere classified: Secondary | ICD-10-CM | POA: Diagnosis not present

## 2020-06-18 DIAGNOSIS — M2560 Stiffness of unspecified joint, not elsewhere classified: Secondary | ICD-10-CM | POA: Diagnosis not present

## 2020-06-18 NOTE — Therapy (Signed)
[facility] [address], [place], [address] Phone: [phone]   Fax:  [phone]  Physical Therapy Treatment  Patient Details  Name: [name] MRN: [id number] Date of Birth: 08-01-1945 Referring Provider (PT): Erline Hau, [place]   Encounter Date: 06/18/2020   PT End of Session - 06/18/20 1430    Visit Number 15    Number of Visits 24    Date for PT Re-Evaluation 06/26/20    Authorization Type Medicare reporting period from 04/08/2020    Progress Note Due on Visit 10    PT Start Time 1352    PT Stop Time 1423    PT Time Calculation (min) 31 min    Equipment Utilized During Treatment Gait belt    Activity Tolerance Patient tolerated treatment well;Patient limited by fatigue;Patient limited by pain    Behavior During Therapy [facility] for tasks assessed/performed           Past Medical History:  Diagnosis Date  . Anxiety   . Arthritis   . Bilateral chronic knee pain   . Chronic back pain   . Depression   . GERD (gastroesophageal reflux disease)   . Insomnia   . Memory changes   . Sinus bradycardia   . Urge incontinence     Past Surgical History:  Procedure Laterality Date  . ABDOMINAL HYSTERECTOMY  1995  . back sugery     lumbar  . BIOPSY THYROID     benign goiter  . BUNIONECTOMY  2013   rt foot  . COLONOSCOPY    . MUSCLE BIOPSY Left 10/03/2012   Procedure: LEFT QUADRICEP MUSCLE BIOPSY;  Surgeon: Odis Hollingshead, MD;  Location: [facility];  Service: General;  Laterality: Left;  . NECK SURGERY  2010   cerv disc fused     There were no vitals filed for this visit.   Subjective Assessment - 06/18/20 1410    Subjective Patinent reports usual knee pain at B anteiror knees. Reports 7/10 during ambulation on TM. She has been visiting her family last week. She did some walking last week but not much formal HEP. No medical updates. Has an appointment with [facility] in mid April.  States she brought her old AFO even though it hurts some because she feel like she cannot manage at PT without it. No falls since last session.    Pertinent History Patient is a 75 y.o. female who presents to outpatient physical therapy with a referral for medical diagnosis thoracic spine pain, lumbar spine fusion, generalized weakness. This patient's chief complaints consist of weakness and difficulty moving R leg leading to the following functional deficits: increased difficulty with daily tasks and mobility including walking, stairs, getting in and out of the car, grocery shopping, general mobility, fear of falling.. She ambulates with a single point cane and carbon fiber AFO on the R ankle.  Relevant past medical history and comorbidities include anxiety, bilateral chronic knee pain, arthritis, chronic back pain, GERD, memory changes, sinus bradycardia, urge incontinence, R foot bunionectomy (2013), former smoker.   Patient denies hx of cancer, stroke, seizures, lung problem, major cardiac events, diabetes, unexplained weight loss, changes in bowel or bladder problems, new onset stumbling or dropping things.    Limitations Lifting;Standing;Walking;House hold activities    Diagnostic tests Chest/Lumbar CT report from 01/14/2020: " IMPRESSION:  1. No CT evidence for acute thoracic, abdominal or pelvic injury.  2. No acute fracture involving the  thoracic or lumbar spine.  3. Chronic findings as detailed above."    Patient Stated Goals "to get as much out of it as I can to move better"    Currently in Pain? Yes    Pain Score 7            TREATMENT:   Old R AFOdonned entire session.  Therapeutic exercise:to centralize symptoms and improve ROM, strength, muscular endurance, and activity tolerance required for successful completion of functional activities. -Treadmill x 6 min 1.69mph with 2% grade (first 4 min) with BUE, CGA and constant cuing to clear R lE. Dragging R foot > 50% of the time. 6  min.  (seated rest)  - standing alternating toe taps on 9 inch step with left UE support and R UE support on SPC.3x10 each side. (difficulty flexing right hip and DF R ankle to reach step). - seated 3kg med ball roll 1x10 each foot, for hamstring training.   - ambulation ~ 100 feet to vehicle with SBA-minA (curb) using SPC.   Pt required multimodal cuing for proper technique and to facilitate improved neuromuscular control, strength, range of motion, and functional ability resulting in improved performance and form.  HOME EXERCISE PROGRAM  Access Code: [email or web address]  URL: [email or web address]  Date: 04/16/2020  Prepared by: Rosita Kea   Exercises  Standing Toe Taps - 1 x daily - 3 sets - 10 reps  Standing Hip Abduction with Counter Support - 1 x daily - 3 sets - 10 reps  Sit to Stand with Armchair - 1 x daily - 3 sets    PT Education - 06/18/20 1726    Education Details exercise purpose/form.    Person(s) Educated Patient    Methods Explanation;Demonstration;Tactile cues;Verbal cues    Comprehension Verbalized understanding;Returned demonstration;Verbal cues required;Tactile cues required;Need further instruction            PT Short Term Goals - 05/07/20 1319      PT SHORT TERM GOAL #1   Title Be independent with initial home exercise program for self-management of symptoms.    Baseline To be initiated at visit 2 as appropriate (04/03/2020); provided but not participating (04/24/2020); participating in ambulation and sit <> stand (05/07/2020);    Time 2    Period Weeks    Status Partially Met    Target Date 04/17/20             PT Long Term Goals - 05/07/20 1331      PT LONG TERM GOAL #1   Title Be independent with a long-term home exercise program for self-management of symptoms.    Baseline to be updated at visit 2 as appropriate (04/03/2020); partially participating (05/07/2020);    Time 12    Period Weeks    Status Partially Met   TARGET DATE FOR  ALL LONG TERM GOALS: 06/26/2020     PT LONG TERM GOAL #2   Title Demonstrate improved FOTO score to equal or greater than 56 by visit #20 demonstrate improvement in overall condition and self-reported functional ability.    Baseline 48 (04/03/2020); 45 (05/07/2020);    Time 12    Period Weeks    Status On-going      PT LONG TERM GOAL #3   Title Patient will improve her 10 MWT speed with her SPC to at least 1.2 m/s to promote better community ambulation and safe crossing of streets.    Baseline to be measured visit 2 (04/03/2020); 0.54 meters/second with SPC and R  AFO (04/16/2020): 0.6 meters/second with SPC (05/07/2020);    Time 12    Period Weeks    Status Partially Met      PT LONG TERM GOAL #4   Title Patient will improve ABC score by 13 percentage points to demonstrate improved self-reported balance.    Baseline 23.8% (04/03/2020); 32.5% (05/07/2020);    Time 12    Period Weeks    Status Partially Met      PT LONG TERM GOAL #5   Title Patient will complete 5 Times Sit to Stand test from chair height without UE support in equal or less than 14 seconds to improve B LE power and strength for transfers and improved mobility, and demonstrate decreased fall risk (threshold between 12 and 15 for increased fall risk).    Baseline 28 seconds with great difficulty from 18.5 inch plinth with B UE support on mat and knees. Very unstable (04/03/2020); 18 seconds with moderate difficulty from 18.5 inch plinth with B UE support on mat. Mildly unstable. (05/07/2020);    Time 12    Period Weeks    Status Partially Met      PT LONG TERM GOAL #6   Title Pateint will improve 6 Minute Walk Test distance to equal or greater than 1000 feet with LRAD to demonstrate improved activity tolerance and endurance for community mobility and participation.    Baseline to be tested visit 2 (04/04/2019); 475 feet with SPC with SBA. (04/16/2020); 562 feet with SPC and CGA (05/07/2020);    Time 12    Period Weeks    Status  Partially Met                 Plan - 06/18/20 1926    Clinical Impression Statement Patient over 10 min late, which limited time available for session. Patient tolerated treatment with some difficulty with fatigue and required extended rest breaks with strong encouragement to continue. Continued working on LE and functional strength and activity tolerance. Continues to be limited by weakness in R LE flexion. Patient would benefit from continued management of limiting condition by skilled physical therapist to address remaining impairments and functional limitations to work towards stated goals and return to PLOF or maximal functional independence.    Personal Factors and Comorbidities Age;Comorbidity 3+;Education;Past/Current Experience;Fitness;Time since onset of injury/illness/exacerbation;Social Background    Comorbidities Relevant past medical history and comorbidities include anxiety, bilateral chronic knee pain, arthritis, chronic back pain, GERD, memory changes, sinus bradycardia, urge incontinence, R foot bunionectomy (2013), former smoker.    Examination-Activity Limitations Bed Mobility;Lift;Stairs;Squat;Bend;Locomotion Level;Stand;Caring for Others;Carry;Transfers;Dressing    Examination-Participation Restrictions Laundry;Church;Cleaning;Shop;Community Activity;Meal Prep;Driving;Yard Work    Conservation officer, historic buildings Evolving/Moderate complexity    Rehab Potential Fair    PT Frequency 2x / week    PT Duration 12 weeks    PT Treatment/Interventions ADLs/Self Care Home Management;Aquatic Therapy;Cryotherapy;Moist Heat;Functional mobility training;Stair training;Gait training;DME Instruction;Therapeutic activities;Therapeutic exercise;Balance training;Orthotic Fit/Training;Neuromuscular re-education;Patient/family education;Manual techniques;Passive range of motion;Dry needling;Electrical Stimulation;Energy conservation;Joint Manipulations;Spinal Manipulations;Taping;Splinting     PT Next Visit Plan strengthening/balance exercises    PT Home Exercise Plan [email or web address] Access Code: [email or web address]    Consulted and Agree with Plan of Care Patient           Patient will benefit from skilled therapeutic intervention in order to improve the following deficits and impairments:  Abnormal gait,Decreased knowledge of use of DME,Impaired sensation,Improper body mechanics,Pain,Decreased coordination,Decreased mobility,Impaired tone,Postural dysfunction,Decreased activity tolerance,Decreased endurance,Decreased range of motion,Decreased strength,Impaired perceived functional ability,Difficulty walking,Decreased balance  Visit Diagnosis: Difficulty  in walking, not elsewhere classified  Muscle weakness (generalized)  Other symptoms and signs involving the musculoskeletal system  Bilateral low back pain without sciatica, unspecified chronicity  Unsteadiness on feet  Right knee pain, unspecified chronicity  Left knee pain, unspecified chronicity     Problem List Patient Active Problem List   Diagnosis Date Noted  . Chronic back pain 08/07/2019  . Depression 08/07/2019  . Near syncope 08/06/2019  . Sinus bradycardia 08/06/2019  . Weakness 11/03/2012    Everlean Alstrom. Graylon Good, PT, DPT 06/18/20, 7:28 PM  [facility] [address], [place], [address] Phone: [phone]   Fax:  [phone]  Name: [name] MRN: [id number] Date of Birth: [birth year]

## 2020-06-20 DIAGNOSIS — K219 Gastro-esophageal reflux disease without esophagitis: Secondary | ICD-10-CM | POA: Diagnosis not present

## 2020-06-20 DIAGNOSIS — M170 Bilateral primary osteoarthritis of knee: Secondary | ICD-10-CM | POA: Diagnosis not present

## 2020-06-20 DIAGNOSIS — G4700 Insomnia, unspecified: Secondary | ICD-10-CM | POA: Diagnosis not present

## 2020-06-20 DIAGNOSIS — F419 Anxiety disorder, unspecified: Secondary | ICD-10-CM | POA: Diagnosis not present

## 2020-06-24 ENCOUNTER — Ambulatory Visit: Payer: Medicare Other | Admitting: Physical Therapy

## 2020-06-25 ENCOUNTER — Ambulatory Visit
Admission: RE | Admit: 2020-06-25 | Discharge: 2020-06-25 | Disposition: A | Discharge status: Home / Self Care | Payer: Medicare Other | Source: Ambulatory Visit | Attending: Diagnostic Neuroimaging | Admitting: Diagnostic Neuroimaging

## 2020-06-25 DIAGNOSIS — G3184 Mild cognitive impairment, so stated: Secondary | ICD-10-CM

## 2020-06-26 ENCOUNTER — Ambulatory Visit: Payer: Medicare Other | Attending: Physician Assistant | Admitting: Physical Therapy

## 2020-06-26 ENCOUNTER — Encounter: Payer: Self-pay | Admitting: Physical Therapy

## 2020-06-26 ENCOUNTER — Other Ambulatory Visit: Payer: Self-pay

## 2020-06-26 DIAGNOSIS — M25561 Pain in right knee: Secondary | ICD-10-CM | POA: Diagnosis not present

## 2020-06-26 DIAGNOSIS — M25562 Pain in left knee: Secondary | ICD-10-CM | POA: Diagnosis not present

## 2020-06-26 DIAGNOSIS — R2681 Unsteadiness on feet: Secondary | ICD-10-CM | POA: Insufficient documentation

## 2020-06-26 DIAGNOSIS — M545 Low back pain, unspecified: Secondary | ICD-10-CM | POA: Insufficient documentation

## 2020-06-26 DIAGNOSIS — M6281 Muscle weakness (generalized): Secondary | ICD-10-CM | POA: Diagnosis not present

## 2020-06-26 DIAGNOSIS — R262 Difficulty in walking, not elsewhere classified: Secondary | ICD-10-CM

## 2020-06-26 DIAGNOSIS — R29898 Other symptoms and signs involving the musculoskeletal system: Secondary | ICD-10-CM | POA: Diagnosis not present

## 2020-06-26 NOTE — Therapy (Signed)
[facility] [address], [place], [address] Phone: [phone]   Fax:  [phone]  Physical Therapy Treatment / Progress Note / Re-Certification Dates of reporting: 05/09/2020 - 06/26/2020  Patient Details  Name: [name] MRN: [id number] Date of Birth: [birth year] Referring Provider (PT): Sabino Niemann, [place]   Encounter Date: 06/26/2020   PT End of Session - 06/26/20 1955    Visit Number 16    Number of Visits 24    Date for PT Re-Evaluation 06/26/20    Authorization Type Medicare reporting period from 04/08/2020    Progress Note Due on Visit 10    PT Start Time 1602    PT Stop Time 1643    PT Time Calculation (min) 41 min    Equipment Utilized During Treatment Gait belt    Activity Tolerance Patient tolerated treatment well;Patient limited by fatigue;Patient limited by pain    Behavior During Therapy [facility] for tasks assessed/performed   repetative conversation          Past Medical History:  Diagnosis Date  . Anxiety   . Arthritis   . Bilateral chronic knee pain   . Chronic back pain   . Depression   . GERD (gastroesophageal reflux disease)   . Insomnia   . Memory changes   . Sinus bradycardia   . Urge incontinence     Past Surgical History:  Procedure Laterality Date  . ABDOMINAL HYSTERECTOMY  1995  . back sugery     lumbar  . BIOPSY THYROID     benign goiter  . BUNIONECTOMY  2013   rt foot  . COLONOSCOPY    . MUSCLE BIOPSY Left 10/03/2012   Procedure: LEFT QUADRICEP MUSCLE BIOPSY;  Surgeon: Adolph Pollack, MD;  Location: [facility];  Service: General;  Laterality: Left;  . NECK SURGERY  2010   cerv disc fused     There were no vitals filed for this visit.   Subjective Assessment - 06/26/20 1603    Subjective Pateint reports she was able to go to [facility] and they measured for a new AFO. They also suggested a special brace for her knee to help prevent valgus but  she needs to see the orthopedist and get an order for that. She has an appointment with the orthopedist tomorrow. She also is planning to get a referral for foot orthotic.  Reports pain is not bad today when she is wearing a compression sleeve on the right knee. She is tired from moving around a lot today. Does not provide a numeric pain rating after asking multiple times. States PT is going well. States that without it she does not thing she would have the mobility she has. Has been working on sit <> stand and walking at home.    Pertinent History Patient is a 75 y.o. female who presents to outpatient physical therapy with a referral for medical diagnosis thoracic spine pain, lumbar spine fusion, generalized weakness. This patient's chief complaints consist of weakness and difficulty moving R leg leading to the following functional deficits: increased difficulty with daily tasks and mobility including walking, stairs, getting in and out of the car, grocery shopping, general mobility, fear of falling.. She ambulates with a single point cane and carbon fiber AFO on the R ankle.  Relevant past medical history and comorbidities include anxiety, bilateral chronic knee pain, arthritis, chronic back pain, GERD, memory changes, sinus bradycardia, urge incontinence, R foot  bunionectomy (2013), former smoker.   Patient denies hx of cancer, stroke, seizures, lung problem, major cardiac events, diabetes, unexplained weight loss, changes in bowel or bladder problems, new onset stumbling or dropping things.    Limitations Lifting;Standing;Walking;House hold activities    Diagnostic tests Chest/Lumbar CT report from 01/14/2020: " IMPRESSION:  1. No CT evidence for acute thoracic, abdominal or pelvic injury.  2. No acute fracture involving the thoracic or lumbar spine.  3. Chronic findings as detailed above."    Patient Stated Goals "to get as much out of it as I can to move better"    Currently in Pain? Yes    Pain Score --    "not too bad" did not give numeric rating after being asked multiple times   Pain Location Knee    Pain Orientation Right;Left           OBJECTIVE  FOTO = 45 (06/26/2020);  ABC scale = 26.3 (06/26/2020):   FUNCTIONAL/BALANCE TESTS (damaged R AFO donned throughout, reports being very tired after a lot of appointments today): - 10 Meter Walk Trial: 0.55 meters/second with SPC  - 6 Minute Walk Test: 496 feet with SPC and CGA (at least 2 stumbles) - Five Time Sit to Stand (5TSTS):18seconds with moderate difficulty from 18.5 inch plinth with L UE support on mat. Mildly unstable.Unable to use R hand due to pain at radial MTP joints from using it so much to push off. Required two attempts.     TREATMENT: Damaged R AFO donned throughout  Therapeutic exercise:to centralize symptoms and improve ROM, strength, muscular endurance, and activity tolerance required for successful completion of functional activities. - ambulation at max speed over 10 meters with SPC and SBA - ambulation around clinic x 104min for time using SPC and SBA for safety. (483feet) (seated rest) - sit <> stand 2x 5 for time from 18.5  Inch plinth.  - standing alternating toe taps on 8.5 inch step with left UE supportand R UE support on SPC. 3x10 each side.  - standing hip abduction with BUE support, 1x10, 1 second hold.verbal/tactile cuing - ambulation ~ 100 feet to vehicle with SBA-minA (curb) using SPC.   Pt required multimodal cuing for proper technique and to facilitate improved neuromuscular control, strength, range of motion, and functional ability resulting in improved performance and form.  HOME EXERCISE PROGRAM  Access Code: [email or web address]  URL: [email or web address]  Date: 04/16/2020  Prepared by: Rosita Kea   Exercises  Standing Toe Taps - 1 x daily - 3 sets - 10 reps  Standing Hip Abduction with Counter Support - 1 x daily - 3 sets - 10 reps  Sit to Stand with Armchair - 1 x daily - 3  sets     PT Education - 06/26/20 1954    Education Details exercise purpose/form. POC.    Person(s) Educated Patient    Methods Explanation;Demonstration;Verbal cues    Comprehension Verbalized understanding;Returned demonstration;Verbal cues required;Need further instruction            PT Short Term Goals - 06/26/20 2001      PT SHORT TERM GOAL #1   Title Be independent with initial home exercise program for self-management of symptoms.    Baseline To be initiated at visit 2 as appropriate (04/03/2020); provided but not participating (04/24/2020); participating in ambulation and sit <> stand (05/07/2020; 06/26/2020);    Time 2    Period Weeks    Status Partially Met    Target Date 04/17/20  PT Long Term Goals - 06/26/20 2001      PT LONG TERM GOAL #1   Title Be independent with a long-term home exercise program for self-management of symptoms.    Baseline to be updated at visit 2 as appropriate (04/03/2020); partially participating (05/07/2020; 06/26/2020);    Time 12    Period Weeks    Status Partially Met   TARGET DATE FOR ALL LONG TERM GOALS: 06/26/2020. TARGET DATE FOR UNMET GOALS UPDATED TO 09/18/2020     PT LONG TERM GOAL #2   Title Demonstrate improved FOTO score to equal or greater than 56 by visit #20 demonstrate improvement in overall condition and self-reported functional ability.    Baseline 48 (04/03/2020); 45 (05/07/2020); 45 (06/26/2020);    Time 12    Period Weeks    Status On-going      PT LONG TERM GOAL #3   Title Patient will improve her 10 MWT speed with her SPC to at least 1.2 m/s to promote better community ambulation and safe crossing of streets.    Baseline to be measured visit 2 (04/03/2020); 0.54 meters/second with SPC and R AFO (04/16/2020): 0.6 meters/second with SPC (05/07/2020);  0.55 meters/second with SPC (06/26/2020);    Time 12    Period Weeks    Status On-going      PT LONG TERM GOAL #4   Title Patient will improve ABC score by 13  percentage points to demonstrate improved self-reported balance.    Baseline 23.8% (04/03/2020); 32.5% (05/07/2020); 26.3% (06/26/2020);    Time 12    Period Weeks    Status Partially Met      PT LONG TERM GOAL #5   Title Patient will complete 5 Times Sit to Stand test from chair height without UE support in equal or less than 14 seconds to improve B LE power and strength for transfers and improved mobility, and demonstrate decreased fall risk (threshold between 12 and 15 for increased fall risk).    Baseline 28 seconds with great difficulty from 18.5 inch plinth with B UE support on mat and knees. Very unstable (04/03/2020); 18 seconds with moderate difficulty from 18.5 inch plinth with B UE support on mat. Mildly unstable. (05/07/2020); 18 seconds from same surface with similar limitations (06/26/2020);    Time 12    Period Weeks    Status Partially Met      PT LONG TERM GOAL #6   Title Pateint will improve 6 Minute Walk Test distance to equal or greater than 1000 feet with LRAD to demonstrate improved activity tolerance and endurance for community mobility and participation.    Baseline to be tested visit 2 (04/04/2019); 475 feet with SPC with SBA. (04/16/2020); 562 feet with SPC and CGA (05/07/2020); 454 feet with SPC and CGA and at least two stumbles (06/26/2020);    Time 12    Period Weeks    Status On-going                 Plan - 06/26/20 2000    Clinical Impression Statement Patient has attended 16 physical therapy sessions this episode of care. She was very tired today and her AFO is broken and not working properly. She did not show any improvement in goals this session, likely related to fatigue and increased difficulty walking with AFO not working as well as previously. Patient continues to struggle with chronic weakness and the R LE considered to be chronic radiculopathy after back surgery years ago. She demonstrates poor  control of R LE resulting in abnormal forces on the knee, ankle,  and hip including genuvalgus and altered gait pattern. In the past patient has declined in functional mobility without physical therapy and has been told by her physician she will need it for the rest of her life. Patient continues to struggle with functional mobility and would benefit from continued physical therapy to maintain or improve her functional independence and prevent decline. Patient would benefit from continued management of limiting condition by skilled physical therapist to address remaining impairments and functional limitations to work towards stated goals and return to PLOF or maximal functional independence.    Personal Factors and Comorbidities Age;Comorbidity 3+;Education;Past/Current Experience;Fitness;Time since onset of injury/illness/exacerbation;Social Background    Comorbidities Relevant past medical history and comorbidities include anxiety, bilateral chronic knee pain, arthritis, chronic back pain, GERD, memory changes, sinus bradycardia, urge incontinence, R foot bunionectomy (2013), former smoker.    Examination-Activity Limitations Bed Mobility;Lift;Stairs;Squat;Bend;Locomotion Level;Stand;Caring for Others;Carry;Transfers;Dressing    Examination-Participation Restrictions Laundry;Church;Cleaning;Shop;Community Activity;Meal Prep;Driving;Yard Work    Merchant navy officer Evolving/Moderate complexity    Rehab Potential Fair    PT Frequency 2x / week    PT Duration 12 weeks    PT Treatment/Interventions ADLs/Self Care Home Management;Aquatic Therapy;Cryotherapy;Moist Heat;Functional mobility training;Stair training;Gait training;DME Instruction;Therapeutic activities;Therapeutic exercise;Balance training;Orthotic Fit/Training;Neuromuscular re-education;Patient/family education;Manual techniques;Passive range of motion;Dry needling;Electrical Stimulation;Energy conservation;Joint Manipulations;Spinal Manipulations;Taping;Splinting    PT Next Visit Plan  strengthening/balance exercises    PT Home Exercise Plan [email or web address] Access Code: [email or web address]    Consulted and Agree with Plan of Care Patient           Patient will benefit from skilled therapeutic intervention in order to improve the following deficits and impairments:  Abnormal gait,Decreased knowledge of use of DME,Impaired sensation,Improper body mechanics,Pain,Decreased coordination,Decreased mobility,Impaired tone,Postural dysfunction,Decreased activity tolerance,Decreased endurance,Decreased range of motion,Decreased strength,Impaired perceived functional ability,Difficulty walking,Decreased balance  Visit Diagnosis: Difficulty in walking, not elsewhere classified  Muscle weakness (generalized)  Other symptoms and signs involving the musculoskeletal system  Bilateral low back pain without sciatica, unspecified chronicity  Unsteadiness on feet  Right knee pain, unspecified chronicity  Left knee pain, unspecified chronicity     Problem List Patient Active Problem List   Diagnosis Date Noted  . Chronic back pain 08/07/2019  . Depression 08/07/2019  . Near syncope 08/06/2019  . Sinus bradycardia 08/06/2019  . Weakness 11/03/2012    Everlean Alstrom. Graylon Good, PT, DPT 06/26/20, 8:04 PM  [facility] [address], [place], [address] Phone: [phone]   Fax:  [phone]  Name: [name] MRN: [id number] Date of Birth: 1945/04/26

## 2020-06-27 DIAGNOSIS — Z6832 Body mass index (BMI) 32.0-32.9, adult: Secondary | ICD-10-CM | POA: Diagnosis not present

## 2020-06-27 DIAGNOSIS — M1120 Other chondrocalcinosis, unspecified site: Secondary | ICD-10-CM | POA: Diagnosis not present

## 2020-06-27 DIAGNOSIS — M170 Bilateral primary osteoarthritis of knee: Secondary | ICD-10-CM | POA: Diagnosis not present

## 2020-06-27 DIAGNOSIS — E669 Obesity, unspecified: Secondary | ICD-10-CM | POA: Diagnosis not present

## 2020-06-27 DIAGNOSIS — R29898 Other symptoms and signs involving the musculoskeletal system: Secondary | ICD-10-CM | POA: Diagnosis not present

## 2020-06-27 DIAGNOSIS — M7989 Other specified soft tissue disorders: Secondary | ICD-10-CM | POA: Diagnosis not present

## 2020-07-01 ENCOUNTER — Ambulatory Visit: Payer: Medicare Other | Admitting: Physical Therapy

## 2020-07-01 ENCOUNTER — Other Ambulatory Visit: Payer: Self-pay

## 2020-07-01 ENCOUNTER — Encounter: Payer: Self-pay | Admitting: Physical Therapy

## 2020-07-01 DIAGNOSIS — R29898 Other symptoms and signs involving the musculoskeletal system: Secondary | ICD-10-CM

## 2020-07-01 DIAGNOSIS — M25562 Pain in left knee: Secondary | ICD-10-CM

## 2020-07-01 DIAGNOSIS — M545 Low back pain, unspecified: Secondary | ICD-10-CM | POA: Diagnosis not present

## 2020-07-01 DIAGNOSIS — R2681 Unsteadiness on feet: Secondary | ICD-10-CM

## 2020-07-01 DIAGNOSIS — M25561 Pain in right knee: Secondary | ICD-10-CM | POA: Diagnosis not present

## 2020-07-01 DIAGNOSIS — M6281 Muscle weakness (generalized): Secondary | ICD-10-CM | POA: Diagnosis not present

## 2020-07-01 DIAGNOSIS — R262 Difficulty in walking, not elsewhere classified: Secondary | ICD-10-CM

## 2020-07-01 NOTE — Therapy (Signed)
[facility] [address], [place], [address] Phone: [phone]   Fax:  [phone]  Physical Therapy Treatment  Patient Details  Name: [name] MRN: [id number] Date of Birth: [birth year] Referring Provider (PT): Erline Hau, [place]   Encounter Date: 07/01/2020   PT End of Session - 07/01/20 1619    Visit Number 17    Number of Visits 24    Date for PT Re-Evaluation 06/26/20    Authorization Type Medicare reporting period from 06/26/2020    Progress Note Due on Visit 10    PT Start Time 1355    PT Stop Time 1425    PT Time Calculation (min) 30 min    Equipment Utilized During Treatment Gait belt    Activity Tolerance Patient tolerated treatment well;Patient limited by fatigue;Patient limited by pain    Behavior During Therapy [facility] for tasks assessed/performed   repetative conversation          Past Medical History:  Diagnosis Date  . Anxiety   . Arthritis   . Bilateral chronic knee pain   . Chronic back pain   . Depression   . GERD (gastroesophageal reflux disease)   . Insomnia   . Memory changes   . Sinus bradycardia   . Urge incontinence     Past Surgical History:  Procedure Laterality Date  . ABDOMINAL HYSTERECTOMY  1995  . back sugery     lumbar  . BIOPSY THYROID     benign goiter  . BUNIONECTOMY  2013   rt foot  . COLONOSCOPY    . MUSCLE BIOPSY Left 10/03/2012   Procedure: LEFT QUADRICEP MUSCLE BIOPSY;  Surgeon: Odis Hollingshead, MD;  Location: [place];  Service: General;  Laterality: Left;  . NECK SURGERY  2010   cerv disc fused     There were no vitals filed for this visit.   Subjective Assessment - 07/01/20 1617    Subjective Patient reports she saw the orthopedist who gave her scripts for the knee brace and shoe inserts. She states she goes back to [place] near the first of next month. Her current AFO continues to really bother her since it is broken.  States she has not been as active at home lately. Reports usual irritation in the knees R > L but does not provide numeric rating. States she feels tired a lot that makes her not want to do anything.    Pertinent History Patient is a 75 y.o. female who presents to outpatient physical therapy with a referral for medical diagnosis thoracic spine pain, lumbar spine fusion, generalized weakness. This patient's chief complaints consist of weakness and difficulty moving R leg leading to the following functional deficits: increased difficulty with daily tasks and mobility including walking, stairs, getting in and out of the car, grocery shopping, general mobility, fear of falling.. She ambulates with a single point cane and carbon fiber AFO on the R ankle.  Relevant past medical history and comorbidities include anxiety, bilateral chronic knee pain, arthritis, chronic back pain, GERD, memory changes, sinus bradycardia, urge incontinence, R foot bunionectomy (2013), former smoker.   Patient denies hx of cancer, stroke, seizures, lung problem, major cardiac events, diabetes, unexplained weight loss, changes in bowel or bladder problems, new onset stumbling or dropping things.    Limitations Lifting;Standing;Walking;House hold activities    Diagnostic tests Chest/Lumbar CT report from 01/14/2020: " IMPRESSION:  1. No CT evidence for acute  thoracic, abdominal or pelvic injury.  2. No acute fracture involving the thoracic or lumbar spine.  3. Chronic findings as detailed above."    Patient Stated Goals "to get as much out of it as I can to move better"    Currently in Pain? Yes             TREATMENT: Damaged R AFO donned throughout  Therapeutic exercise:to centralize symptoms and improve ROM, strength, muscular endurance, and activity tolerance required for successful completion of functional activities.  - ambulation around clinic x 15mn for time using SPC and SBA for safety. (4138ft) - seated  marching on elevated seat (green chair with airex on it), B UE support, 3x20 each side (very difficult to flex R hip).  - sit <> stand from elevated seat (green chair with airex on it), with small pink theraball between knees to decrease valgus. 1x7 but discontinued due to heavy arm use.  - Squats with BUE support focusing on glute activation and proper form with hip hinging, stabilized back, and tibial perpendicular to floor with knees behind toes. Pink theraball between knees to decrease R knee valgus and green chair behind as target. 1x10 (reports R knee pain).  - seated hip abduction against green theraband 1x10 (too much compensatory movement at L hip and pelvis), 2x10 with yellow theraband (improved form).  - Education on HEP including handout  - ambulation ~ 100 feet to vehicle with SBA-minA (curb) using SPC. - education about benefits of talk therapy if she has depression   Pt required multimodal cuing for proper technique and to facilitate improved neuromuscular control, strength, range of motion, and functional ability resulting in improved performance and form.  HOME EXERCISE PROGRAM  Access Code: [email or web address]: [email or web address] Date: 07/01/2020 Prepared by: SaRosita KeaExercises Seated Hip Abduction with Resistance - 1 x daily - 3 sets - 20 reps     PT Education - 07/01/20 1619    Education Details exercise purpose/form    Person(s) Educated Patient    Methods Explanation;Demonstration;Verbal cues;Tactile cues    Comprehension Verbalized understanding;Returned demonstration;Verbal cues required;Tactile cues required;Need further instruction            PT Short Term Goals - 06/26/20 2001      PT SHORT TERM GOAL #1   Title Be independent with initial home exercise program for self-management of symptoms.    Baseline To be initiated at visit 2 as appropriate (04/03/2020); provided but not participating (04/24/2020); participating in ambulation and sit <>  stand (05/07/2020; 06/26/2020);    Time 2    Period Weeks    Status Partially Met    Target Date 04/17/20             PT Long Term Goals - 06/26/20 2001      PT LONG TERM GOAL #1   Title Be independent with a long-term home exercise program for self-management of symptoms.    Baseline to be updated at visit 2 as appropriate (04/03/2020); partially participating (05/07/2020; 06/26/2020);    Time 12    Period Weeks    Status Partially Met   TARGET DATE FOR ALL LONG TERM GOALS: 06/26/2020. TARGET DATE FOR UNMET GOALS UPDATED TO 09/18/2020     PT LONG TERM GOAL #2   Title Demonstrate improved FOTO score to equal or greater than 56 by visit #20 demonstrate improvement in overall condition and self-reported functional ability.    Baseline 48 (04/03/2020); 45 (05/07/2020); 45 (06/26/2020);    Time  12    Period Weeks    Status On-going      PT LONG TERM GOAL #3   Title Patient will improve her 10 MWT speed with her SPC to at least 1.2 m/s to promote better community ambulation and safe crossing of streets.    Baseline to be measured visit 2 (04/03/2020); 0.54 meters/second with SPC and R AFO (04/16/2020): 0.6 meters/second with SPC (05/07/2020);  0.55 meters/second with SPC (06/26/2020);    Time 12    Period Weeks    Status On-going      PT LONG TERM GOAL #4   Title Patient will improve ABC score by 13 percentage points to demonstrate improved self-reported balance.    Baseline 23.8% (04/03/2020); 32.5% (05/07/2020); 26.3% (06/26/2020);    Time 12    Period Weeks    Status Partially Met      PT LONG TERM GOAL #5   Title Patient will complete 5 Times Sit to Stand test from chair height without UE support in equal or less than 14 seconds to improve B LE power and strength for transfers and improved mobility, and demonstrate decreased fall risk (threshold between 12 and 15 for increased fall risk).    Baseline 28 seconds with great difficulty from 18.5 inch plinth with B UE support on mat and knees. Very  unstable (04/03/2020); 18 seconds with moderate difficulty from 18.5 inch plinth with B UE support on mat. Mildly unstable. (05/07/2020); 18 seconds from same surface with similar limitations (06/26/2020);    Time 12    Period Weeks    Status Partially Met      PT LONG TERM GOAL #6   Title Pateint will improve 6 Minute Walk Test distance to equal or greater than 1000 feet with LRAD to demonstrate improved activity tolerance and endurance for community mobility and participation.    Baseline to be tested visit 2 (04/04/2019); 475 feet with SPC with SBA. (04/16/2020); 562 feet with SPC and CGA (05/07/2020); 454 feet with SPC and CGA and at least two stumbles (06/26/2020);    Time 12    Period Weeks    Status On-going                 Plan - 07/01/20 1625    Clinical Impression Statement Patient continues to present as very fatigued and discouraged about progress with frustration related to AFO not working. Continued working on LE and functional strengthening. Continues to have great difficulty flexing and abducting R hip. Patient concerned she is getting weaker and notes she is not doing as much at home due to feeling tired and unmotivated all the time. Per documentation her PCP suspects depression. Encouraged patient to seek care for this and discussed the benefits it could have. Updated HEP to include more accessable exercises.  Patient would benefit from continued management of limiting condition by skilled physical therapist to address remaining impairments and functional limitations to work towards stated goals and return to PLOF or maximal functional independence.    Personal Factors and Comorbidities Age;Comorbidity 3+;Education;Past/Current Experience;Fitness;Time since onset of injury/illness/exacerbation;Social Background    Comorbidities Relevant past medical history and comorbidities include anxiety, bilateral chronic knee pain, arthritis, chronic back pain, GERD, memory changes, sinus  bradycardia, urge incontinence, R foot bunionectomy (2013), former smoker.    Examination-Activity Limitations Bed Mobility;Lift;Stairs;Squat;Bend;Locomotion Level;Stand;Caring for Others;Carry;Transfers;Dressing    Examination-Participation Restrictions Laundry;Church;Cleaning;Shop;Community Activity;Meal Prep;Driving;Yard Work    Producer, television/film/video  PT Frequency 2x / week    PT Duration 12 weeks    PT Treatment/Interventions ADLs/Self Care Home Management;Aquatic Therapy;Cryotherapy;Moist Heat;Functional mobility training;Stair training;Gait training;DME Instruction;Therapeutic activities;Therapeutic exercise;Balance training;Orthotic Fit/Training;Neuromuscular re-education;Patient/family education;Manual techniques;Passive range of motion;Dry needling;Electrical Stimulation;Energy conservation;Joint Manipulations;Spinal Manipulations;Taping;Splinting    PT Next Visit Plan strengthening/balance exercises    PT Home Exercise Plan [email or web address] Access Code: [email or web address]    Consulted and Agree with Plan of Care Patient           Patient will benefit from skilled therapeutic intervention in order to improve the following deficits and impairments:  Abnormal gait,Decreased knowledge of use of DME,Impaired sensation,Improper body mechanics,Pain,Decreased coordination,Decreased mobility,Impaired tone,Postural dysfunction,Decreased activity tolerance,Decreased endurance,Decreased range of motion,Decreased strength,Impaired perceived functional ability,Difficulty walking,Decreased balance  Visit Diagnosis: Difficulty in walking, not elsewhere classified  Muscle weakness (generalized)  Other symptoms and signs involving the musculoskeletal system  Bilateral low back pain without sciatica, unspecified chronicity  Unsteadiness on feet  Right knee pain, unspecified chronicity  Left knee pain, unspecified chronicity     Problem  List Patient Active Problem List   Diagnosis Date Noted  . Chronic back pain 08/07/2019  . Depression 08/07/2019  . Near syncope 08/06/2019  . Sinus bradycardia 08/06/2019  . Weakness 11/03/2012    Everlean Alstrom. Graylon Good, PT, DPT 07/01/20, 4:27 PM  [facility] [address] [place], [address] Phone: [phone]   Fax:  [phone]  Name: [name] MRN: [id number] Date of Birth: 1945-04-03

## 2020-07-03 ENCOUNTER — Ambulatory Visit: Payer: Medicare Other | Admitting: Physical Therapy

## 2020-07-03 ENCOUNTER — Other Ambulatory Visit: Payer: Self-pay

## 2020-07-03 ENCOUNTER — Encounter: Payer: Self-pay | Admitting: Physical Therapy

## 2020-07-03 DIAGNOSIS — R2681 Unsteadiness on feet: Secondary | ICD-10-CM

## 2020-07-03 DIAGNOSIS — R29898 Other symptoms and signs involving the musculoskeletal system: Secondary | ICD-10-CM

## 2020-07-03 DIAGNOSIS — M25561 Pain in right knee: Secondary | ICD-10-CM

## 2020-07-03 DIAGNOSIS — R262 Difficulty in walking, not elsewhere classified: Secondary | ICD-10-CM | POA: Diagnosis not present

## 2020-07-03 DIAGNOSIS — M25562 Pain in left knee: Secondary | ICD-10-CM

## 2020-07-03 DIAGNOSIS — M6281 Muscle weakness (generalized): Secondary | ICD-10-CM

## 2020-07-03 DIAGNOSIS — M545 Low back pain, unspecified: Secondary | ICD-10-CM

## 2020-07-03 NOTE — Therapy (Signed)
[facility] [address] [place], [address] Phone: [phone]   Fax:  [phone]  Physical Therapy Treatment  Patient Details  Name: [name] MRN: [id number] Date of Birth: April 13, 1945 Referring Provider (PT): Erline Hau, [place]   Encounter Date: 07/03/2020   PT End of Session - 07/03/20 1321    Visit Number 18    Number of Visits 24    Date for PT Re-Evaluation 09/18/20    Authorization Type Medicare reporting period from 06/26/2020    Progress Note Due on Visit 10    PT Start Time 1308    PT Stop Time 1340    PT Time Calculation (min) 32 min    Equipment Utilized During Treatment Gait belt    Activity Tolerance Patient tolerated treatment well;Patient limited by fatigue;Patient limited by pain    Behavior During Therapy [facility] for tasks assessed/performed   repetative conversation          Past Medical History:  Diagnosis Date  . Anxiety   . Arthritis   . Bilateral chronic knee pain   . Chronic back pain   . Depression   . GERD (gastroesophageal reflux disease)   . Insomnia   . Memory changes   . Sinus bradycardia   . Urge incontinence     Past Surgical History:  Procedure Laterality Date  . ABDOMINAL HYSTERECTOMY  1995  . back sugery     lumbar  . BIOPSY THYROID     benign goiter  . BUNIONECTOMY  2013   rt foot  . COLONOSCOPY    . MUSCLE BIOPSY Left 10/03/2012   Procedure: LEFT QUADRICEP MUSCLE BIOPSY;  Surgeon: Odis Hollingshead, MD;  Location: [place];  Service: General;  Laterality: Left;  . NECK SURGERY  2010   cerv disc fused     There were no vitals filed for this visit.   Subjective Assessment - 07/03/20 1317    Subjective Patient reports she is having even more trouble walking due to her R AFO cracking further. She feels like maybe she should just take it off. She had no pain when she got here except some in the knees and on her R foot where the AFO  crack is bothering her.    Pertinent History Patient is a 75 y.o. female who presents to outpatient physical therapy with a referral for medical diagnosis thoracic spine pain, lumbar spine fusion, generalized weakness. This patient's chief complaints consist of weakness and difficulty moving R leg leading to the following functional deficits: increased difficulty with daily tasks and mobility including walking, stairs, getting in and out of the car, grocery shopping, general mobility, fear of falling.. She ambulates with a single point cane and carbon fiber AFO on the R ankle.  Relevant past medical history and comorbidities include anxiety, bilateral chronic knee pain, arthritis, chronic back pain, GERD, memory changes, sinus bradycardia, urge incontinence, R foot bunionectomy (2013), former smoker.   Patient denies hx of cancer, stroke, seizures, lung problem, major cardiac events, diabetes, unexplained weight loss, changes in bowel or bladder problems, new onset stumbling or dropping things.    Limitations Lifting;Standing;Walking;House hold activities    Diagnostic tests Chest/Lumbar CT report from 01/14/2020: " IMPRESSION:  1. No CT evidence for acute thoracic, abdominal or pelvic injury.  2. No acute fracture involving the thoracic or lumbar spine.  3. Chronic findings as detailed above."    Patient Stated Goals "to  get as much out of it as I can to move better"    Currently in Pain? No/denies           TREATMENT: DamagedR AFO donned throughout  Therapeutic exercise:to centralize symptoms and improve ROM, strength, muscular endurance, and activity tolerance required for successful completion of functional activities.  Circuit:  - standing leaning on plinth: cross marching, 3x20 each side - seated long arc quad, 3x30 each side, R 5# AW, L 01/04/14 AW.   Sit <> supine with min A  Mat exercises (with AAROM PT assist as needed).  - sidelying clamshell, 3x10, R side only - hooklying  bridge, 3x10 - hooklying R sided march/hip flexion, 3x10 - hooklying R heel slide with slick surface under heel, 3x10 - supine R hip abduction slide with heel on slick surface, 3x10 - supine glute set/hip extension with legs pushing into flat side of BOSU ball, 2x10  - ambulation ~ 100 feet to vehicle with SBA-minA (curb) using SPC.  Pt required multimodal cuing for proper technique and to facilitate improved neuromuscular control, strength, range of motion, and functional ability resulting in improved performance and form.  HOME EXERCISE PROGRAM  Access Code: [email or web address] URL: [email or web address] Date: 07/01/2020 Prepared by: Norton Blizzard  Exercises Seated Hip Abduction with Resistance - 1 x daily - 3 sets - 20 reps    PT Education - 07/03/20 1321    Education Details exercise purpose/form    Person(s) Educated Patient    Methods Explanation;Demonstration;Tactile cues;Verbal cues    Comprehension Verbalized understanding;Returned demonstration;Verbal cues required;Tactile cues required;Need further instruction            PT Short Term Goals - 06/26/20 2001      PT SHORT TERM GOAL #1   Title Be independent with initial home exercise program for self-management of symptoms.    Baseline To be initiated at visit 2 as appropriate (04/03/2020); provided but not participating (04/24/2020); participating in ambulation and sit <> stand (05/07/2020; 06/26/2020);    Time 2    Period Weeks    Status Partially Met    Target Date 04/17/20             PT Long Term Goals - 06/26/20 2001      PT LONG TERM GOAL #1   Title Be independent with a long-term home exercise program for self-management of symptoms.    Baseline to be updated at visit 2 as appropriate (04/03/2020); partially participating (05/07/2020; 06/26/2020);    Time 12    Period Weeks    Status Partially Met   TARGET DATE FOR ALL LONG TERM GOALS: 06/26/2020. TARGET DATE FOR UNMET GOALS UPDATED TO 09/18/2020     PT  LONG TERM GOAL #2   Title Demonstrate improved FOTO score to equal or greater than 56 by visit #20 demonstrate improvement in overall condition and self-reported functional ability.    Baseline 48 (04/03/2020); 45 (05/07/2020); 45 (06/26/2020);    Time 12    Period Weeks    Status On-going      PT LONG TERM GOAL #3   Title Patient will improve her 10 MWT speed with her SPC to at least 1.2 m/s to promote better community ambulation and safe crossing of streets.    Baseline to be measured visit 2 (04/03/2020); 0.54 meters/second with SPC and R AFO (04/16/2020): 0.6 meters/second with SPC (05/07/2020);  0.55 meters/second with SPC (06/26/2020);    Time 12    Period Weeks    Status On-going  PT LONG TERM GOAL #4   Title Patient will improve ABC score by 13 percentage points to demonstrate improved self-reported balance.    Baseline 23.8% (04/03/2020); 32.5% (05/07/2020); 26.3% (06/26/2020);    Time 12    Period Weeks    Status Partially Met      PT LONG TERM GOAL #5   Title Patient will complete 5 Times Sit to Stand test from chair height without UE support in equal or less than 14 seconds to improve B LE power and strength for transfers and improved mobility, and demonstrate decreased fall risk (threshold between 12 and 15 for increased fall risk).    Baseline 28 seconds with great difficulty from 18.5 inch plinth with B UE support on mat and knees. Very unstable (04/03/2020); 18 seconds with moderate difficulty from 18.5 inch plinth with B UE support on mat. Mildly unstable. (05/07/2020); 18 seconds from same surface with similar limitations (06/26/2020);    Time 12    Period Weeks    Status Partially Met      PT LONG TERM GOAL #6   Title Pateint will improve 6 Minute Walk Test distance to equal or greater than 1000 feet with LRAD to demonstrate improved activity tolerance and endurance for community mobility and participation.    Baseline to be tested visit 2 (04/04/2019); 475 feet with SPC with SBA.  (04/16/2020); 562 feet with SPC and CGA (05/07/2020); 454 feet with SPC and CGA and at least two stumbles (06/26/2020);    Time 12    Period Weeks    Status On-going                 Plan - 07/03/20 1446    Clinical Impression Statement Today's session focused on offloaded or non standing exercises due to difficulty with broken R AFO. Patient demonstrated significantly improved activation of R hip abductors and hip ER after several sets of mat exercises. Overall struggled with balance when upright, had difficulty with bed mobility, and demonstrates motor weakness in R hip motions. Patient would benefit from continued management of limiting condition by skilled physical therapist to address remaining impairments and functional limitations to work towards stated goals and return to PLOF or maximal functional independence.    Personal Factors and Comorbidities Age;Comorbidity 3+;Education;Past/Current Experience;Fitness;Time since onset of injury/illness/exacerbation;Social Background    Comorbidities Relevant past medical history and comorbidities include anxiety, bilateral chronic knee pain, arthritis, chronic back pain, GERD, memory changes, sinus bradycardia, urge incontinence, R foot bunionectomy (2013), former smoker.    Examination-Activity Limitations Bed Mobility;Lift;Stairs;Squat;Bend;Locomotion Level;Stand;Caring for Others;Carry;Transfers;Dressing    Examination-Participation Restrictions Laundry;Church;Cleaning;Shop;Community Activity;Meal Prep;Driving;Yard Work    Merchant [employer] officer Evolving/Moderate complexity    Rehab Potential Fair    PT Frequency 2x / week    PT Duration 12 weeks    PT Treatment/Interventions ADLs/Self Care Home Management;Aquatic Therapy;Cryotherapy;Moist Heat;Functional mobility training;Stair training;Gait training;DME Instruction;Therapeutic activities;Therapeutic exercise;Balance training;Orthotic Fit/Training;Neuromuscular  re-education;Patient/family education;Manual techniques;Passive range of motion;Dry needling;Electrical Stimulation;Energy conservation;Joint Manipulations;Spinal Manipulations;Taping;Splinting    PT Next Visit Plan strengthening/balance exercises    PT Home Exercise Plan [email or web address] Access Code: [email or web address]    Consulted and Agree with Plan of Care Patient           Patient will benefit from skilled therapeutic intervention in order to improve the following deficits and impairments:  Abnormal gait,Decreased knowledge of use of DME,Impaired sensation,Improper body mechanics,Pain,Decreased coordination,Decreased mobility,Impaired tone,Postural dysfunction,Decreased activity tolerance,Decreased endurance,Decreased range of motion,Decreased strength,Impaired perceived functional ability,Difficulty walking,Decreased balance  Visit Diagnosis: Difficulty in walking,  not elsewhere classified  Muscle weakness (generalized)  Other symptoms and signs involving the musculoskeletal system  Bilateral low back pain without sciatica, unspecified chronicity  Unsteadiness on feet  Right knee pain, unspecified chronicity  Left knee pain, unspecified chronicity     Problem List Patient Active Problem List   Diagnosis Date Noted  . Chronic back pain 08/07/2019  . Depression 08/07/2019  . Near syncope 08/06/2019  . Sinus bradycardia 08/06/2019  . Weakness 11/03/2012    Everlean Alstrom. Graylon Good, PT, DPT 07/03/20, 2:47 PM  [facility] [address] [place], [address] Phone: [phone]   Fax:  [phone]  Name: ASPEN LAWRANCE MRN: [id number] Date of Birth: 1945/05/07

## 2020-07-04 ENCOUNTER — Encounter: Payer: Self-pay | Admitting: *Deleted

## 2020-07-08 ENCOUNTER — Ambulatory Visit: Payer: Medicare Other | Admitting: Physical Therapy

## 2020-07-08 DIAGNOSIS — F0390 Unspecified dementia without behavioral disturbance: Secondary | ICD-10-CM | POA: Diagnosis not present

## 2020-07-08 DIAGNOSIS — E7800 Pure hypercholesterolemia, unspecified: Secondary | ICD-10-CM | POA: Diagnosis not present

## 2020-07-08 DIAGNOSIS — K219 Gastro-esophageal reflux disease without esophagitis: Secondary | ICD-10-CM | POA: Diagnosis not present

## 2020-07-08 DIAGNOSIS — Z131 Encounter for screening for diabetes mellitus: Secondary | ICD-10-CM | POA: Diagnosis not present

## 2020-07-08 DIAGNOSIS — R5383 Other fatigue: Secondary | ICD-10-CM | POA: Diagnosis not present

## 2020-07-08 DIAGNOSIS — R7303 Prediabetes: Secondary | ICD-10-CM | POA: Diagnosis not present

## 2020-07-08 DIAGNOSIS — R5382 Chronic fatigue, unspecified: Secondary | ICD-10-CM | POA: Diagnosis not present

## 2020-07-08 DIAGNOSIS — G959 Disease of spinal cord, unspecified: Secondary | ICD-10-CM | POA: Diagnosis not present

## 2020-07-08 DIAGNOSIS — F322 Major depressive disorder, single episode, severe without psychotic features: Secondary | ICD-10-CM | POA: Diagnosis not present

## 2020-07-08 DIAGNOSIS — H9313 Tinnitus, bilateral: Secondary | ICD-10-CM | POA: Diagnosis not present

## 2020-07-08 DIAGNOSIS — M6281 Muscle weakness (generalized): Secondary | ICD-10-CM | POA: Diagnosis not present

## 2020-07-10 ENCOUNTER — Ambulatory Visit: Payer: Medicare Other | Admitting: Physical Therapy

## 2020-07-10 DIAGNOSIS — Z79899 Other long term (current) drug therapy: Secondary | ICD-10-CM | POA: Diagnosis not present

## 2020-07-10 DIAGNOSIS — H2513 Age-related nuclear cataract, bilateral: Secondary | ICD-10-CM | POA: Diagnosis not present

## 2020-07-10 DIAGNOSIS — G4700 Insomnia, unspecified: Secondary | ICD-10-CM | POA: Diagnosis not present

## 2020-07-10 DIAGNOSIS — H40023 Open angle with borderline findings, high risk, bilateral: Secondary | ICD-10-CM | POA: Diagnosis not present

## 2020-07-10 DIAGNOSIS — G3184 Mild cognitive impairment, so stated: Secondary | ICD-10-CM | POA: Diagnosis not present

## 2020-07-10 DIAGNOSIS — F419 Anxiety disorder, unspecified: Secondary | ICD-10-CM | POA: Diagnosis not present

## 2020-07-10 DIAGNOSIS — K219 Gastro-esophageal reflux disease without esophagitis: Secondary | ICD-10-CM | POA: Diagnosis not present

## 2020-07-10 DIAGNOSIS — R7303 Prediabetes: Secondary | ICD-10-CM | POA: Diagnosis not present

## 2020-07-10 DIAGNOSIS — H11821 Conjunctivochalasis, right eye: Secondary | ICD-10-CM | POA: Diagnosis not present

## 2020-07-10 DIAGNOSIS — Z0000 Encounter for general adult medical examination without abnormal findings: Secondary | ICD-10-CM | POA: Diagnosis not present

## 2020-07-10 DIAGNOSIS — M170 Bilateral primary osteoarthritis of knee: Secondary | ICD-10-CM | POA: Diagnosis not present

## 2020-07-10 DIAGNOSIS — N183 Chronic kidney disease, stage 3 unspecified: Secondary | ICD-10-CM | POA: Diagnosis not present

## 2020-07-10 DIAGNOSIS — H02122 Mechanical ectropion of right lower eyelid: Secondary | ICD-10-CM | POA: Diagnosis not present

## 2020-07-15 ENCOUNTER — Encounter: Payer: Self-pay | Admitting: Physical Therapy

## 2020-07-15 ENCOUNTER — Ambulatory Visit: Payer: Medicare Other | Admitting: Physical Therapy

## 2020-07-15 ENCOUNTER — Other Ambulatory Visit: Payer: Self-pay

## 2020-07-15 DIAGNOSIS — R2681 Unsteadiness on feet: Secondary | ICD-10-CM | POA: Diagnosis not present

## 2020-07-15 DIAGNOSIS — M25562 Pain in left knee: Secondary | ICD-10-CM

## 2020-07-15 DIAGNOSIS — R29898 Other symptoms and signs involving the musculoskeletal system: Secondary | ICD-10-CM | POA: Diagnosis not present

## 2020-07-15 DIAGNOSIS — M25561 Pain in right knee: Secondary | ICD-10-CM

## 2020-07-15 DIAGNOSIS — R262 Difficulty in walking, not elsewhere classified: Secondary | ICD-10-CM | POA: Diagnosis not present

## 2020-07-15 DIAGNOSIS — M545 Low back pain, unspecified: Secondary | ICD-10-CM | POA: Diagnosis not present

## 2020-07-15 DIAGNOSIS — M6281 Muscle weakness (generalized): Secondary | ICD-10-CM | POA: Diagnosis not present

## 2020-07-15 NOTE — Therapy (Signed)
[facility] [address] [place], [address] Phone: [phone]   Fax:  [phone]  Physical Therapy Treatment  Patient Details  Name: [name] MRN: [id number] Date of Birth: 07-Jun-1945 Referring Provider (PT): Erline Hau, [place]   Encounter Date: 07/15/2020   PT End of Session - 07/15/20 1743    Visit Number 19    Number of Visits 24    Date for PT Re-Evaluation 09/18/20    Authorization Type Medicare reporting period from 06/26/2020    Progress Note Due on Visit 10    PT Start Time 1308    PT Stop Time 1345    PT Time Calculation (min) 37 min    Equipment Utilized During Treatment Gait belt    Activity Tolerance Patient tolerated treatment well;Patient limited by fatigue    Behavior During Therapy [facility] for tasks assessed/performed   repetative conversation           Past Medical History:  Diagnosis Date  . Anxiety   . Arthritis   . Bilateral chronic knee pain   . Chronic back pain   . Depression   . GERD (gastroesophageal reflux disease)   . Insomnia   . Memory changes   . Sinus bradycardia   . Urge incontinence     Past Surgical History:  Procedure Laterality Date  . ABDOMINAL HYSTERECTOMY  1995  . back sugery     lumbar  . BIOPSY THYROID     benign goiter  . BUNIONECTOMY  2013   rt foot  . COLONOSCOPY    . MUSCLE BIOPSY Left 10/03/2012   Procedure: LEFT QUADRICEP MUSCLE BIOPSY;  Surgeon: Odis Hollingshead, MD;  Location: [facility];  Service: General;  Laterality: Left;  . NECK SURGERY  2010   cerv disc fused     There were no vitals filed for this visit.   Subjective Assessment - 07/15/20 1741    Subjective Patient reports she is feeling okay today but has not been as mobile lately because she is afraid to fall. Has stopped using her her AFO all together because it is hurting her foot. Has noticed it is very had to clear her R foot without the AFO working  properly. Arrives with [facility] and states she has no pain more than her usual soreness in knees (does not provide numeric rating).    Pertinent History Patient is a 75 y.o. female who presents to outpatient physical therapy with a referral for medical diagnosis thoracic spine pain, lumbar spine fusion, generalized weakness. This patient's chief complaints consist of weakness and difficulty moving R leg leading to the following functional deficits: increased difficulty with daily tasks and mobility including walking, stairs, getting in and out of the car, grocery shopping, general mobility, fear of falling.. She ambulates with a single point cane and carbon fiber AFO on the R ankle.  Relevant past medical history and comorbidities include anxiety, bilateral chronic knee pain, arthritis, chronic back pain, GERD, memory changes, sinus bradycardia, urge incontinence, R foot bunionectomy (2013), former smoker.   Patient denies hx of cancer, stroke, seizures, lung problem, major cardiac events, diabetes, unexplained weight loss, changes in bowel or bladder problems, new onset stumbling or dropping things.    Limitations Lifting;Standing;Walking;House hold activities    Diagnostic tests Chest/Lumbar CT report from 01/14/2020: " IMPRESSION:  1. No CT evidence for acute thoracic, abdominal or pelvic injury.  2. No acute fracture involving the  thoracic or lumbar spine.  3. Chronic findings as detailed above."    Patient Stated Goals "to get as much out of it as I can to move better"    Currently in Pain? No/denies         OBJECTIVE FOTO: 40 (4l25/2022) ABC: 31.9% 6MWT: 415 feet with SPC and CGA (no AFO).   TREATMENT: No AFO donned throughout  Therapeutic exercise:to centralize symptoms and improve ROM, strength, muscular endurance, and activity tolerance required for successful completion of functional activities.  - ambulation around clinic x500 feet (415 feet in 6 min) with SPC and CGA for safety.  Consistent cuing for increased R foot clearance.   Circuit:  - standing leaning on plinth: cross marching, 2x20 each side - seated long arc quad, 2x30 each side, R 5# AW, L 20 AW.   Sit <> supine with mod A  Mat exercises (with AAROM PT assist as needed).  - hooklying R heel slide with slick surface under heel, 1x10 - supine R hip abduction slide with heel on slick surface, 8V56  Pt required multimodal cuing for proper technique and to facilitate improved neuromuscular control, strength, range of motion, and functional ability resulting in improved performance and form.  Additional time with screening of R MCP swelling in digits 2-3 by OT who recommended the following activities (given as instructions below in print out) and provided compression glove: 1. Hot/Cold (3 times a day): 2 min heat, 1 min cold for three cycles.  2. Make things you hold fatter/bigger 3. Compression glove 4. Hold things in palms instead of gripping with fingers as able.   Additional 6 min (unbilled) spent completing FOTO questionnaire.    HOME EXERCISE PROGRAM Access Code: [email or web address] URL: [email or web address] Date: 07/01/2020 Prepared by: Rosita Kea  Exercises Seated Hip Abduction with Resistance - 1 x daily - 3 sets - 20 reps    PT Education - 07/15/20 1743    Education Details exercise purpose/form    Person(s) Educated Patient    Methods Explanation;Demonstration;Tactile cues;Verbal cues;Handout    Comprehension Verbalized understanding;Returned demonstration;Verbal cues required;Tactile cues required            PT Short Term Goals - 06/26/20 2001      PT SHORT TERM GOAL #1   Title Be independent with initial home exercise program for self-management of symptoms.    Baseline To be initiated at visit 2 as appropriate (04/03/2020); provided but not participating (04/24/2020); participating in ambulation and sit <> stand (05/07/2020; 06/26/2020);    Time 2    Period Weeks     Status Partially Met    Target Date 04/17/20             PT Long Term Goals - 06/26/20 2001      PT LONG TERM GOAL #1   Title Be independent with a long-term home exercise program for self-management of symptoms.    Baseline to be updated at visit 2 as appropriate (04/03/2020); partially participating (05/07/2020; 06/26/2020);    Time 12    Period Weeks    Status Partially Met   TARGET DATE FOR ALL LONG TERM GOALS: 06/26/2020. TARGET DATE FOR UNMET GOALS UPDATED TO 09/18/2020     PT LONG TERM GOAL #2   Title Demonstrate improved FOTO score to equal or greater than 56 by visit #20 demonstrate improvement in overall condition and self-reported functional ability.    Baseline 48 (04/03/2020); 45 (05/07/2020); 45 (06/26/2020);    Time 12    Period  Weeks    Status On-going      PT LONG TERM GOAL #3   Title Patient will improve her 10 MWT speed with her SPC to at least 1.2 m/s to promote better community ambulation and safe crossing of streets.    Baseline to be measured visit 2 (04/03/2020); 0.54 meters/second with SPC and R AFO (04/16/2020): 0.6 meters/second with SPC (05/07/2020);  0.55 meters/second with SPC (06/26/2020);    Time 12    Period Weeks    Status On-going      PT LONG TERM GOAL #4   Title Patient will improve ABC score by 13 percentage points to demonstrate improved self-reported balance.    Baseline 23.8% (04/03/2020); 32.5% (05/07/2020); 26.3% (06/26/2020);    Time 12    Period Weeks    Status Partially Met      PT LONG TERM GOAL #5   Title Patient will complete 5 Times Sit to Stand test from chair height without UE support in equal or less than 14 seconds to improve B LE power and strength for transfers and improved mobility, and demonstrate decreased fall risk (threshold between 12 and 15 for increased fall risk).    Baseline 28 seconds with great difficulty from 18.5 inch plinth with B UE support on mat and knees. Very unstable (04/03/2020); 18 seconds with moderate difficulty from  18.5 inch plinth with B UE support on mat. Mildly unstable. (05/07/2020); 18 seconds from same surface with similar limitations (06/26/2020);    Time 12    Period Weeks    Status Partially Met      PT LONG TERM GOAL #6   Title Pateint will improve 6 Minute Walk Test distance to equal or greater than 1000 feet with LRAD to demonstrate improved activity tolerance and endurance for community mobility and participation.    Baseline to be tested visit 2 (04/04/2019); 475 feet with SPC with SBA. (04/16/2020); 562 feet with SPC and CGA (05/07/2020); 454 feet with SPC and CGA and at least two stumbles (06/26/2020);    Time 12    Period Weeks    Status On-going                 Plan - 07/15/20 1749    Clinical Impression Statement Continued working on LE strength and ambulation this session. FOTO scores reflect patient report of decreased function with no working AFO.Patient continues to lack R hip strength that significantly impairs her functional mobility. Patient would benefit from continued management of limiting condition by skilled physical therapist to address remaining impairments and functional limitations to work towards stated goals and return to PLOF or maximal functional independence.    Personal Factors and Comorbidities Age;Comorbidity 3+;Education;Past/Current Experience;Fitness;Time since onset of injury/illness/exacerbation;Social Background    Comorbidities Relevant past medical history and comorbidities include anxiety, bilateral chronic knee pain, arthritis, chronic back pain, GERD, memory changes, sinus bradycardia, urge incontinence, R foot bunionectomy (2013), former smoker.    Examination-Activity Limitations Bed Mobility;Lift;Stairs;Squat;Bend;Locomotion Level;Stand;Caring for Others;Carry;Transfers;Dressing    Examination-Participation Restrictions Laundry;Church;Cleaning;Shop;Community Activity;Meal Prep;Driving;Yard Work    [employer] Evolving/Moderate  complexity    Rehab Potential Fair    PT Frequency 2x / week    PT Duration 12 weeks    PT Treatment/Interventions ADLs/Self Care Home Management;Aquatic Therapy;Cryotherapy;Moist Heat;Functional mobility training;Stair training;Gait training;DME Instruction;Therapeutic activities;Therapeutic exercise;Balance training;Orthotic Fit/Training;Neuromuscular re-education;Patient/family education;Manual techniques;Passive range of motion;Dry needling;Electrical Stimulation;Energy conservation;Joint Manipulations;Spinal Manipulations;Taping;Splinting    PT Next Visit Plan strengthening/balance exercises    PT Home Exercise Plan [email or web address]  Access Code: [email or web address]    Consulted and Agree with Plan of Care Patient           Patient will benefit from skilled therapeutic intervention in order to improve the following deficits and impairments:  Abnormal gait,Decreased knowledge of use of DME,Impaired sensation,Improper body mechanics,Pain,Decreased coordination,Decreased mobility,Impaired tone,Postural dysfunction,Decreased activity tolerance,Decreased endurance,Decreased range of motion,Decreased strength,Impaired perceived functional ability,Difficulty walking,Decreased balance  Visit Diagnosis: Difficulty in walking, not elsewhere classified  Muscle weakness (generalized)  Other symptoms and signs involving the musculoskeletal system  Bilateral low back pain without sciatica, unspecified chronicity  Unsteadiness on feet  Right knee pain, unspecified chronicity  Left knee pain, unspecified chronicity     Problem List Patient Active Problem List   Diagnosis Date Noted  . Chronic back pain 08/07/2019  . Depression 08/07/2019  . Near syncope 08/06/2019  . Sinus bradycardia 08/06/2019  . Weakness 11/03/2012    Everlean Alstrom. Graylon Good, PT, DPT 07/15/20, 5:51 PM   [facility] [address], [place], [address] Phone:  [phone]   Fax:  [phone]  Name: PENNYE BEEGHLY MRN: [id number] Date of Birth: Dec 24, 1945

## 2020-07-18 DIAGNOSIS — H05223 Edema of bilateral orbit: Secondary | ICD-10-CM | POA: Diagnosis not present

## 2020-07-18 DIAGNOSIS — H11133 Conjunctival pigmentations, bilateral: Secondary | ICD-10-CM | POA: Diagnosis not present

## 2020-07-18 DIAGNOSIS — H02831 Dermatochalasis of right upper eyelid: Secondary | ICD-10-CM | POA: Diagnosis not present

## 2020-07-18 DIAGNOSIS — H02834 Dermatochalasis of left upper eyelid: Secondary | ICD-10-CM | POA: Diagnosis not present

## 2020-07-19 ENCOUNTER — Other Ambulatory Visit: Payer: Self-pay | Admitting: Physician Assistant

## 2020-07-19 DIAGNOSIS — H05223 Edema of bilateral orbit: Secondary | ICD-10-CM

## 2020-07-25 ENCOUNTER — Ambulatory Visit: Payer: Medicare Other | Attending: Physician Assistant | Admitting: Physical Therapy

## 2020-07-25 ENCOUNTER — Telehealth: Payer: Self-pay | Admitting: Physical Therapy

## 2020-07-25 DIAGNOSIS — M25562 Pain in left knee: Secondary | ICD-10-CM | POA: Insufficient documentation

## 2020-07-25 DIAGNOSIS — M545 Low back pain, unspecified: Secondary | ICD-10-CM | POA: Insufficient documentation

## 2020-07-25 DIAGNOSIS — R2681 Unsteadiness on feet: Secondary | ICD-10-CM | POA: Insufficient documentation

## 2020-07-25 DIAGNOSIS — R29898 Other symptoms and signs involving the musculoskeletal system: Secondary | ICD-10-CM | POA: Insufficient documentation

## 2020-07-25 DIAGNOSIS — R262 Difficulty in walking, not elsewhere classified: Secondary | ICD-10-CM | POA: Insufficient documentation

## 2020-07-25 DIAGNOSIS — M25561 Pain in right knee: Secondary | ICD-10-CM | POA: Insufficient documentation

## 2020-07-25 DIAGNOSIS — M6281 Muscle weakness (generalized): Secondary | ICD-10-CM | POA: Insufficient documentation

## 2020-07-25 NOTE — Telephone Encounter (Signed)
Called patient when she did not show up today for her 11:15am appointment. Patient answered and said she put it on her calendar wrong and had called [facility] to confirm it. She thought it was next week. Confirmed her next appointment Thursday 08/01/2020 at 1:45pm, which she said she was writing on her calendar.   Everlean Alstrom. Graylon Good, PT, DPT 07/25/20, 11:51 AM

## 2020-07-29 ENCOUNTER — Encounter: Payer: Medicare Other | Admitting: Physical Therapy

## 2020-07-31 ENCOUNTER — Encounter: Payer: Self-pay | Admitting: Physical Therapy

## 2020-07-31 ENCOUNTER — Other Ambulatory Visit: Payer: Self-pay

## 2020-07-31 ENCOUNTER — Ambulatory Visit: Payer: Medicare Other | Admitting: Physical Therapy

## 2020-07-31 DIAGNOSIS — M25561 Pain in right knee: Secondary | ICD-10-CM

## 2020-07-31 DIAGNOSIS — M25562 Pain in left knee: Secondary | ICD-10-CM

## 2020-07-31 DIAGNOSIS — R2681 Unsteadiness on feet: Secondary | ICD-10-CM

## 2020-07-31 DIAGNOSIS — M6281 Muscle weakness (generalized): Secondary | ICD-10-CM

## 2020-07-31 DIAGNOSIS — R262 Difficulty in walking, not elsewhere classified: Secondary | ICD-10-CM | POA: Diagnosis not present

## 2020-07-31 DIAGNOSIS — R29898 Other symptoms and signs involving the musculoskeletal system: Secondary | ICD-10-CM

## 2020-07-31 DIAGNOSIS — M545 Low back pain, unspecified: Secondary | ICD-10-CM | POA: Diagnosis not present

## 2020-07-31 NOTE — Therapy (Signed)
[facility] [address] [place], [address] Phone: [phone]   Fax:  [phone]  Physical Therapy Treatment / Progress Note Dates of reporting: 05/09/2020 to 07/31/2020  Patient Details  Name: ANYSSA SHARPLESS MRN: [id number] Date of Birth: Jul 31, 1945 Referring Provider (PT): Erline Hau, [place]   Encounter Date: 07/31/2020   PT End of Session - 07/31/20 1529    Visit Number 20    Number of Visits 24    Date for PT Re-Evaluation 09/18/20    Authorization Type Medicare reporting period from 06/26/2020    Progress Note Due on Visit 10    PT Start Time 1517    PT Stop Time 1557    PT Time Calculation (min) 40 min    Equipment Utilized During Treatment Gait belt    Activity Tolerance Patient tolerated treatment well;Patient limited by fatigue    Behavior During Therapy [facility] for tasks assessed/performed   repetative conversation          Past Medical History:  Diagnosis Date  . Anxiety   . Arthritis   . Bilateral chronic knee pain   . Chronic back pain   . Depression   . GERD (gastroesophageal reflux disease)   . Insomnia   . Memory changes   . Sinus bradycardia   . Urge incontinence     Past Surgical History:  Procedure Laterality Date  . ABDOMINAL HYSTERECTOMY  1995  . back sugery     lumbar  . BIOPSY THYROID     benign goiter  . BUNIONECTOMY  2013   rt foot  . COLONOSCOPY    . MUSCLE BIOPSY Left 10/03/2012   Procedure: LEFT QUADRICEP MUSCLE BIOPSY;  Surgeon: Odis Hollingshead, MD;  Location: [facility];  Service: General;  Laterality: Left;  . NECK SURGERY  2010   cerv disc fused     There were no vitals filed for this visit.   Subjective Assessment - 07/31/20 1518    Subjective Patient reports she is feeling tired today. She got her new right AFO last week and she is working on getting used to it and also to walking with her [facility] in her left hand. Her right hand is doing  better but she wants to keep it that way. No falls recently. Has bilateral knee pain R > L of approx 3/10 (her usual pain per patient).    Pertinent History Patient is a 75 y.o. female who presents to outpatient physical therapy with a referral for medical diagnosis thoracic spine pain, lumbar spine fusion, generalized weakness. This patient's chief complaints consist of weakness and difficulty moving R leg leading to the following functional deficits: increased difficulty with daily tasks and mobility including walking, stairs, getting in and out of the car, grocery shopping, general mobility, fear of falling.. She ambulates with a single point cane and carbon fiber AFO on the R ankle.  Relevant past medical history and comorbidities include anxiety, bilateral chronic knee pain, arthritis, chronic back pain, GERD, memory changes, sinus bradycardia, urge incontinence, R foot bunionectomy (2013), former smoker.   Patient denies hx of cancer, stroke, seizures, lung problem, major cardiac events, diabetes, unexplained weight loss, changes in bowel or bladder problems, new onset stumbling or dropping things.    Limitations Lifting;Standing;Walking;House hold activities    Diagnostic tests Chest/Lumbar CT report from 01/14/2020: " IMPRESSION:  1. No CT evidence for acute thoracic, abdominal or pelvic injury.  2. No  acute fracture involving the thoracic or lumbar spine.  3. Chronic findings as detailed above."    Patient Stated Goals "to get as much out of it as I can to move better"    Currently in Pain? Yes    Pain Score 3              OBJECTIVE  FOTO = 40 (07/31/2020);  ABC scale = 27.5% (07/31/2020):  FUNCTIONAL/BALANCE TESTS(new R AFO donned throughout, using SPC in left UE): - 10 Meter Walk Trial: 0.43meters/second with SPC  - 6 Minute Walk Test: 625 feet with SPC (in left UE) and supervision (2 stumbles) -Five Time Sit to Stand (5TSTS):15.5seconds withmoderatedifficulty from 18.5 inch  plinth with L UE support on mat. Mildlyunstable.Avoided use R hand due to pain at radial MTP joints from using it so much to push off.   TREATMENT: New R AFO donned throughout, use of SPC in left UE  Therapeutic exercise:to centralize symptoms and improve ROM, strength, muscular endurance, and activity tolerance required for successful completion of functional activities. - ambulation around clinic x for time using SPC and SBA for safety. (645feet) (seated rest) - ambulation at max speed over 10 meters with SPC and SBA, x2 - sit <>stand 1x 5 for time from 18.5 Inch plinth.  - standing alternating toe taps on 6 inch step with left UE support,2x20 each side.  - seated long arc quad, 2x20 each side, R 5# AW, L 20 AW. - ambulation ~ 100 feet to vehicle with SBA using SPC.   Pt required multimodal cuing for proper technique and to facilitate improved neuromuscular control, strength, range of motion, and functional ability resulting in improved performance and form.  HOME EXERCISE PROGRAM  Access Code: [email or web address]  URL: [email or web address]  Date: 04/16/2020  Prepared by: Norton Blizzard   Exercises  Standing Toe Taps - 1 x daily - 3 sets - 10 reps  Standing Hip Abduction with Counter Support - 1 x daily - 3 sets - 10 reps  Sit to Stand with Armchair - 1 x daily - 3 sets     PT Education - 07/31/20 1809    Education Details exercise purpose/form. progress. POC    Person(s) Educated Patient    Methods Demonstration;Explanation;Tactile cues;Verbal cues    Comprehension Verbalized understanding;Returned demonstration;Verbal cues required;Tactile cues required;Need further instruction            PT Short Term Goals - 06/26/20 2001      PT SHORT TERM GOAL #1   Title Be independent with initial home exercise program for self-management of symptoms.    Baseline To be initiated at visit 2 as appropriate (04/03/2020); provided but not participating (04/24/2020);  participating in ambulation and sit <> stand (05/07/2020; 06/26/2020);    Time 2    Period Weeks    Status Partially Met    Target Date 04/17/20             PT Long Term Goals - 06/26/20 2001      PT LONG TERM GOAL #1   Title Be independent with a long-term home exercise program for self-management of symptoms.    Baseline to be updated at visit 2 as appropriate (04/03/2020); partially participating (05/07/2020; 06/26/2020);    Time 12    Period Weeks    Status Partially Met   TARGET DATE FOR ALL LONG TERM GOALS: 06/26/2020. TARGET DATE FOR UNMET GOALS UPDATED TO 09/18/2020     PT LONG TERM GOAL #2  Title Demonstrate improved FOTO score to equal or greater than 56 by visit #20 demonstrate improvement in overall condition and self-reported functional ability.    Baseline 48 (04/03/2020); 45 (05/07/2020); 45 (06/26/2020);    Time 12    Period Weeks    Status On-going      PT LONG TERM GOAL #3   Title Patient will improve her 10 MWT speed with her SPC to at least 1.2 m/s to promote better community ambulation and safe crossing of streets.    Baseline to be measured visit 2 (04/03/2020); 0.54 meters/second with SPC and R AFO (04/16/2020): 0.6 meters/second with SPC (05/07/2020);  0.55 meters/second with SPC (06/26/2020);    Time 12    Period Weeks    Status On-going      PT LONG TERM GOAL #4   Title Patient will improve ABC score by 13 percentage points to demonstrate improved self-reported balance.    Baseline 23.8% (04/03/2020); 32.5% (05/07/2020); 26.3% (06/26/2020);    Time 12    Period Weeks    Status Partially Met      PT LONG TERM GOAL #5   Title Patient will complete 5 Times Sit to Stand test from chair height without UE support in equal or less than 14 seconds to improve B LE power and strength for transfers and improved mobility, and demonstrate decreased fall risk (threshold between 12 and 15 for increased fall risk).    Baseline 28 seconds with great difficulty from 18.5 inch plinth with  B UE support on mat and knees. Very unstable (04/03/2020); 18 seconds with moderate difficulty from 18.5 inch plinth with B UE support on mat. Mildly unstable. (05/07/2020); 18 seconds from same surface with similar limitations (06/26/2020);    Time 12    Period Weeks    Status Partially Met      PT LONG TERM GOAL #6   Title Pateint will improve 6 Minute Walk Test distance to equal or greater than 1000 feet with LRAD to demonstrate improved activity tolerance and endurance for community mobility and participation.    Baseline to be tested visit 2 (04/04/2019); 475 feet with SPC with SBA. (04/16/2020); 562 feet with SPC and CGA (05/07/2020); 454 feet with SPC and CGA and at least two stumbles (06/26/2020);    Time 12    Period Weeks    Status On-going                 Plan - 07/31/20 1815    Clinical Impression Statement Patient has attended 20 physical therapy sessions and continues to demonstrate improved function when she attends physical therapy more regularly and has working AFO. Patient has had intermittent attendance and was showing declining function while struggling with her broken right AFO. Today she had her new AFO and demonstrated significant improvement in her functional mobility tests this session compared to last. Patient continues to have chronic mobility deficits and pain, stiffness, weakness, joint stiffness, range of motion, activity tolerance, balance, coordination, impairments that negatively affect her functional mobility and independence and increase risk of fall and injury. Patient would benefit from continued management of limiting condition by skilled physical therapist to address remaining impairments and functional limitations to work towards stated goals and return to PLOF or maximal functional independence.    Personal Factors and Comorbidities Age;Comorbidity 3+;Education;Past/Current Experience;Fitness;Time since onset of injury/illness/exacerbation;Social Background     Comorbidities Relevant past medical history and comorbidities include anxiety, bilateral chronic knee pain, arthritis, chronic back pain, GERD, memory changes,  sinus bradycardia, urge incontinence, R foot bunionectomy (2013), former smoker.    Examination-Activity Limitations Bed Mobility;Lift;Stairs;Squat;Bend;Locomotion Level;Stand;Caring for Others;Carry;Transfers;Dressing    Examination-Participation Restrictions Laundry;Church;Cleaning;Shop;Community Activity;Meal Prep;Driving;Yard Work    [employer] officer Evolving/Moderate complexity    Rehab Potential Fair    PT Frequency 2x / week    PT Duration 12 weeks    PT Treatment/Interventions ADLs/Self Care Home Management;Aquatic Therapy;Cryotherapy;Moist Heat;Functional mobility training;Stair training;Gait training;DME Instruction;Therapeutic activities;Therapeutic exercise;Balance training;Orthotic Fit/Training;Neuromuscular re-education;Patient/family education;Manual techniques;Passive range of motion;Dry needling;Electrical Stimulation;Energy conservation;Joint Manipulations;Spinal Manipulations;Taping;Splinting    PT Next Visit Plan strengthening/balance exercises    PT Home Exercise Plan [email or web address] Access Code: [email or web address]    Consulted and Agree with Plan of Care Patient           Patient will benefit from skilled therapeutic intervention in order to improve the following deficits and impairments:  Abnormal gait,Decreased knowledge of use of DME,Impaired sensation,Improper body mechanics,Pain,Decreased coordination,Decreased mobility,Impaired tone,Postural dysfunction,Decreased activity tolerance,Decreased endurance,Decreased range of motion,Decreased strength,Impaired perceived functional ability,Difficulty walking,Decreased balance  Visit Diagnosis: Difficulty in walking, not elsewhere classified  Muscle weakness (generalized)  Other symptoms and signs involving the musculoskeletal system  Bilateral low back pain  without sciatica, unspecified chronicity  Unsteadiness on feet  Right knee pain, unspecified chronicity  Left knee pain, unspecified chronicity     Problem List Patient Active Problem List   Diagnosis Date Noted  . Chronic back pain 08/07/2019  . Depression 08/07/2019  . Near syncope 08/06/2019  . Sinus bradycardia 08/06/2019  . Weakness 11/03/2012    Everlean Alstrom. Graylon Good, PT, DPT 07/31/20, 6:16 PM  [facility] [address], [place], [address] Phone: [phone]   Fax:  [phone]  Name: CANDIACE WEST MRN: [id number] Date of Birth: 07/26/1945

## 2020-08-01 ENCOUNTER — Ambulatory Visit: Payer: Medicare Other | Admitting: Physical Therapy

## 2020-08-02 ENCOUNTER — Ambulatory Visit
Admission: RE | Admit: 2020-08-02 | Discharge: 2020-08-02 | Disposition: A | Discharge status: Home / Self Care | Payer: Medicare Other | Source: Ambulatory Visit | Attending: Physician Assistant | Admitting: Physician Assistant

## 2020-08-02 ENCOUNTER — Other Ambulatory Visit: Payer: Self-pay

## 2020-08-02 DIAGNOSIS — R600 Localized edema: Secondary | ICD-10-CM | POA: Diagnosis not present

## 2020-08-02 DIAGNOSIS — H05223 Edema of bilateral orbit: Secondary | ICD-10-CM

## 2020-08-05 DIAGNOSIS — H05223 Edema of bilateral orbit: Secondary | ICD-10-CM | POA: Diagnosis not present

## 2020-08-07 ENCOUNTER — Encounter: Payer: Self-pay | Admitting: Physical Therapy

## 2020-08-07 ENCOUNTER — Ambulatory Visit: Payer: Medicare Other | Admitting: Physical Therapy

## 2020-08-07 ENCOUNTER — Other Ambulatory Visit: Payer: Self-pay

## 2020-08-07 DIAGNOSIS — M25561 Pain in right knee: Secondary | ICD-10-CM

## 2020-08-07 DIAGNOSIS — M6281 Muscle weakness (generalized): Secondary | ICD-10-CM | POA: Diagnosis not present

## 2020-08-07 DIAGNOSIS — M25562 Pain in left knee: Secondary | ICD-10-CM

## 2020-08-07 DIAGNOSIS — R29898 Other symptoms and signs involving the musculoskeletal system: Secondary | ICD-10-CM | POA: Diagnosis not present

## 2020-08-07 DIAGNOSIS — R2681 Unsteadiness on feet: Secondary | ICD-10-CM | POA: Diagnosis not present

## 2020-08-07 DIAGNOSIS — R262 Difficulty in walking, not elsewhere classified: Secondary | ICD-10-CM

## 2020-08-07 DIAGNOSIS — M545 Low back pain, unspecified: Secondary | ICD-10-CM

## 2020-08-07 NOTE — Therapy (Signed)
[facility] [address], [place], [address] Phone: [phone]   Fax:  [phone]  Physical Therapy Treatment  Patient Details  Name: [name] MRN: [id number] Date of Birth: Jun 18, 1945 Referring Provider (PT): Erline Hau, [place]   Encounter Date: 08/07/2020   PT End of Session - 08/07/20 1740    Visit Number 21    Number of Visits 24    Date for PT Re-Evaluation 09/18/20    Authorization Type Medicare reporting period from 07/31/2020    Progress Note Due on Visit 30    PT Start Time 1736    PT Stop Time 1814    PT Time Calculation (min) 38 min    Equipment Utilized During Treatment Gait belt    Activity Tolerance Patient tolerated treatment well;Patient limited by fatigue    Behavior During Therapy [facility] for tasks assessed/performed   repetative conversation          Past Medical History:  Diagnosis Date  . Anxiety   . Arthritis   . Bilateral chronic knee pain   . Chronic back pain   . Depression   . GERD (gastroesophageal reflux disease)   . Insomnia   . Memory changes   . Sinus bradycardia   . Urge incontinence     Past Surgical History:  Procedure Laterality Date  . ABDOMINAL HYSTERECTOMY  1995  . back sugery     lumbar  . BIOPSY THYROID     benign goiter  . BUNIONECTOMY  2013   rt foot  . COLONOSCOPY    . MUSCLE BIOPSY Left 10/03/2012   Procedure: LEFT QUADRICEP MUSCLE BIOPSY;  Surgeon: Odis Hollingshead, MD;  Location: [facility];  Service: General;  Laterality: Left;  . NECK SURGERY  2010   cerv disc fused     There were no vitals filed for this visit.   Subjective Assessment - 08/07/20 1737    Subjective Patient reports she is tired today. She arrives with SPC, R AFO and Right hinged knee brace. Reports she has her usual pain in her bilateral knees. Can't distiguish her pain number today. States her knee feels better with the brace but it is very bulky.     Pertinent History Patient is a 75 y.o. female who presents to outpatient physical therapy with a referral for medical diagnosis thoracic spine pain, lumbar spine fusion, generalized weakness. This patient's chief complaints consist of weakness and difficulty moving R leg leading to the following functional deficits: increased difficulty with daily tasks and mobility including walking, stairs, getting in and out of the car, grocery shopping, general mobility, fear of falling.. She ambulates with a single point cane and carbon fiber AFO on the R ankle.  Relevant past medical history and comorbidities include anxiety, bilateral chronic knee pain, arthritis, chronic back pain, GERD, memory changes, sinus bradycardia, urge incontinence, R foot bunionectomy (2013), former smoker.   Patient denies hx of cancer, stroke, seizures, lung problem, major cardiac events, diabetes, unexplained weight loss, changes in bowel or bladder problems, new onset stumbling or dropping things.    Limitations Lifting;Standing;Walking;House hold activities    Diagnostic tests Chest/Lumbar CT report from 01/14/2020: " IMPRESSION:  1. No CT evidence for acute thoracic, abdominal or pelvic injury.  2. No acute fracture involving the thoracic or lumbar spine.  3. Chronic findings as detailed above."    Patient Stated Goals "to get as much out of it as I  can to move better"    Currently in Pain? Yes             OBJECTIVE FOTO = 40 (07/31/2020);  ABC scale =27.5%(07/31/2020):  TREATMENT: R AFO + hinged R knee brace donned throughout, use of SPC in left UE  Therapeutic exercise:to centralize symptoms and improve ROM, strength, muscular endurance, and activity tolerance required for successful completion of functional activities. - ambulation around clinic 500 feet (458f in 652m) for time using SPC and SBA-CGA for safety. (2 stumbles) (seated rest) - seated long arc quad,2x20 each side, R 5# AW, L20AW. - seated  forward hip flexion reaching towards floor 1x10 - seated good morning/deadlift (due to form became more like a seated row from hips), 2x10 with 15# cable. - seated R hip abduction/adduction with foot on furniture slider, 2x10 - standing leaning on high plinth: cross marching, 2x10 each side.  - ambulation ~ 100 feet to vehicle with SBA-CGA  using SPC.  Pt required multimodal cuing for proper technique and to facilitate improved neuromuscular control, strength, range of motion, and functional ability resulting in improved performance and form.  HOME EXERCISE PROGRAM  Access Code: [email or web address]: [email or web address]  Date: 04/16/2020  Prepared by: SaRosita Kea Exercises  Standing Toe Taps - 1 x daily - 3 sets - 10 reps  Standing Hip Abduction with Counter Support - 1 x daily - 3 sets - 10 reps  Sit to Stand with Armchair - 1 x daily - 3 sets     PT Education - 08/07/20 1740    Education Details exercise purpose/form.    Person(s) Educated Patient    Methods Explanation;Demonstration;Tactile cues;Verbal cues    Comprehension Verbalized understanding;Returned demonstration;Tactile cues required;Verbal cues required;Need further instruction            PT Short Term Goals - 06/26/20 2001      PT SHORT TERM GOAL #1   Title Be independent with initial home exercise program for self-management of symptoms.    Baseline To be initiated at visit 2 as appropriate (04/03/2020); provided but not participating (04/24/2020); participating in ambulation and sit <> stand (05/07/2020; 06/26/2020);    Time 2    Period Weeks    Status Partially Met    Target Date 04/17/20             PT Long Term Goals - 06/26/20 2001      PT LONG TERM GOAL #1   Title Be independent with a long-term home exercise program for self-management of symptoms.    Baseline to be updated at visit 2 as appropriate (04/03/2020); partially participating (05/07/2020; 06/26/2020);    Time 12    Period  Weeks    Status Partially Met   TARGET DATE FOR ALL LONG TERM GOALS: 06/26/2020. TARGET DATE FOR UNMET GOALS UPDATED TO 09/18/2020     PT LONG TERM GOAL #2   Title Demonstrate improved FOTO score to equal or greater than 56 by visit #20 demonstrate improvement in overall condition and self-reported functional ability.    Baseline 48 (04/03/2020); 45 (05/07/2020); 45 (06/26/2020);    Time 12    Period Weeks    Status On-going      PT LONG TERM GOAL #3   Title Patient will improve her 10 MWT speed with her SPC to at least 1.2 m/s to promote better community ambulation and safe crossing of streets.    Baseline to be measured visit 2 (04/03/2020); 0.54 meters/second with SPC and  R AFO (04/16/2020): 0.6 meters/second with SPC (05/07/2020);  0.55 meters/second with SPC (06/26/2020);    Time 12    Period Weeks    Status On-going      PT LONG TERM GOAL #4   Title Patient will improve ABC score by 13 percentage points to demonstrate improved self-reported balance.    Baseline 23.8% (04/03/2020); 32.5% (05/07/2020); 26.3% (06/26/2020);    Time 12    Period Weeks    Status Partially Met      PT LONG TERM GOAL #5   Title Patient will complete 5 Times Sit to Stand test from chair height without UE support in equal or less than 14 seconds to improve B LE power and strength for transfers and improved mobility, and demonstrate decreased fall risk (threshold between 12 and 15 for increased fall risk).    Baseline 28 seconds with great difficulty from 18.5 inch plinth with B UE support on mat and knees. Very unstable (04/03/2020); 18 seconds with moderate difficulty from 18.5 inch plinth with B UE support on mat. Mildly unstable. (05/07/2020); 18 seconds from same surface with similar limitations (06/26/2020);    Time 12    Period Weeks    Status Partially Met      PT LONG TERM GOAL #6   Title Pateint will improve 6 Minute Walk Test distance to equal or greater than 1000 feet with LRAD to demonstrate improved activity  tolerance and endurance for community mobility and participation.    Baseline to be tested visit 2 (04/04/2019); 475 feet with SPC with SBA. (04/16/2020); 562 feet with SPC and CGA (05/07/2020); 454 feet with SPC and CGA and at least two stumbles (06/26/2020);    Time 12    Period Weeks    Status On-going                 Plan - 08/07/20 1931    Clinical Impression Statement Patient very tired today and required significant encouragement to continue participating in exercises. Wearing new R knee brace that increased work of moving R LE due to weight, fatiguing patient more quickly than at last session and causing decreased R foot clearance during swing phase. Did report less R knee discomfort. Continued working on R hip/LE/functional strength that patient struggles with this session. Patient would benefit from continued management of limiting condition by skilled physical therapist to address remaining impairments and functional limitations to work towards stated goals and return to PLOF or maximal functional independence.    Personal Factors and Comorbidities Age;Comorbidity 3+;Education;Past/Current Experience;Fitness;Time since onset of injury/illness/exacerbation;Social Background    Comorbidities Relevant past medical history and comorbidities include anxiety, bilateral chronic knee pain, arthritis, chronic back pain, GERD, memory changes, sinus bradycardia, urge incontinence, R foot bunionectomy (2013), former smoker.    Examination-Activity Limitations Bed Mobility;Lift;Stairs;Squat;Bend;Locomotion Level;Stand;Caring for Others;Carry;Transfers;Dressing    Examination-Participation Restrictions Laundry;Church;Cleaning;Shop;Community Activity;Meal Prep;Driving;Yard Work    [employer] officer Evolving/Moderate complexity    Rehab Potential Fair    PT Frequency 2x / week    PT Duration 12 weeks    PT Treatment/Interventions ADLs/Self Care Home Management;Aquatic  Therapy;Cryotherapy;Moist Heat;Functional mobility training;Stair training;Gait training;DME Instruction;Therapeutic activities;Therapeutic exercise;Balance training;Orthotic Fit/Training;Neuromuscular re-education;Patient/family education;Manual techniques;Passive range of motion;Dry needling;Electrical Stimulation;Energy conservation;Joint Manipulations;Spinal Manipulations;Taping;Splinting    PT Next Visit Plan strengthening/balance exercises    PT Home Exercise Plan [email or web address] Access Code: [email or web address]    Consulted and Agree with Plan of Care Patient           Patient will benefit from skilled  therapeutic intervention in order to improve the following deficits and impairments:  Abnormal gait,Decreased knowledge of use of DME,Impaired sensation,Improper body mechanics,Pain,Decreased coordination,Decreased mobility,Impaired tone,Postural dysfunction,Decreased activity tolerance,Decreased endurance,Decreased range of motion,Decreased strength,Impaired perceived functional ability,Difficulty walking,Decreased balance  Visit Diagnosis: Difficulty in walking, not elsewhere classified  Muscle weakness (generalized)  Other symptoms and signs involving the musculoskeletal system  Bilateral low back pain without sciatica, unspecified chronicity  Unsteadiness on feet  Right knee pain, unspecified chronicity  Left knee pain, unspecified chronicity     Problem List Patient Active Problem List   Diagnosis Date Noted  . Chronic back pain 08/07/2019  . Depression 08/07/2019  . Near syncope 08/06/2019  . Sinus bradycardia 08/06/2019  . Weakness 11/03/2012   Everlean Alstrom. Graylon Good, PT, DPT 08/07/20, 7:32 PM  [facility] [address] [place], [address] Phone: [phone]   Fax:  [phone]  Name: TASIA LIZ MRN: [id number] Date of Birth: Jan 11, 1946

## 2020-08-13 ENCOUNTER — Ambulatory Visit: Payer: Medicare Other | Admitting: Physical Therapy

## 2020-08-18 ENCOUNTER — Ambulatory Visit (HOSPITAL_COMMUNITY)
Admission: EM | Admit: 2020-08-18 | Discharge: 2020-08-18 | Disposition: A | Discharge status: Home / Self Care | Payer: Medicare Other | Attending: Emergency Medicine | Admitting: Emergency Medicine

## 2020-08-18 ENCOUNTER — Ambulatory Visit (INDEPENDENT_AMBULATORY_CARE_PROVIDER_SITE_OTHER): Payer: Medicare Other

## 2020-08-18 DIAGNOSIS — W19XXXA Unspecified fall, initial encounter: Secondary | ICD-10-CM

## 2020-08-18 DIAGNOSIS — S6991XA Unspecified injury of right wrist, hand and finger(s), initial encounter: Secondary | ICD-10-CM | POA: Diagnosis not present

## 2020-08-18 DIAGNOSIS — M79641 Pain in right hand: Secondary | ICD-10-CM

## 2020-08-18 NOTE — ED Provider Notes (Signed)
[facility]    CSN: [id number] Arrival date & time: 08/18/20  1356      History   Chief Complaint Chief Complaint  Patient presents with  . Hand Injury    HPI [name] is a 75 y.o. female.   Patient here for evaluation of right hand pain and swelling following a fall earlier today.  Reports tripping and landing on right hand.  Reports pain constant and achy and worse with movement.  Has not tried any OTC medication or treatment.  Denies any fevers, chest pain, shortness of breath, N/V/D, numbness, tingling, weakness, abdominal pain, or headaches.    The history is provided by the patient.  Hand Injury   Past Medical History:  Diagnosis Date  . Anxiety   . Arthritis   . Bilateral chronic knee pain   . Chronic back pain   . Depression   . GERD (gastroesophageal reflux disease)   . Insomnia   . Memory changes   . Sinus bradycardia   . Urge incontinence     Patient Active Problem List   Diagnosis Date Noted  . Chronic back pain 08/07/2019  . Depression 08/07/2019  . Near syncope 08/06/2019  . Sinus bradycardia 08/06/2019  . Weakness 11/03/2012    Past Surgical History:  Procedure Laterality Date  . ABDOMINAL HYSTERECTOMY  1995  . back sugery     lumbar  . BIOPSY THYROID     benign goiter  . BUNIONECTOMY  2013   rt foot  . COLONOSCOPY    . MUSCLE BIOPSY Left 10/03/2012   Procedure: LEFT QUADRICEP MUSCLE BIOPSY;  Surgeon: Odis Hollingshead, MD;  Location: [facility];  Service: General;  Laterality: Left;  . NECK SURGERY  2010   cerv disc fused     OB History   No obstetric history on file.      Home Medications    Prior to Admission medications   Medication Sig Start Date End Date Taking? Authorizing Provider  clonazePAM (KLONOPIN) 1 MG tablet Take 1 mg by mouth at bedtime.  Patient not taking: Reported on 06/10/2020 06/29/19   [provider]  cyclobenzaprine (FLEXERIL) 5 MG tablet Take 2 tablets (10 mg total)  by mouth 2 (two) times daily as needed for muscle spasms. Patient not taking: No sig reported 02/29/20   Carmin Muskrat, MD  donepezil (ARICEPT) 10 MG tablet Take 10 mg by mouth at bedtime. 12/11/19   [provider]  mirabegron ER (MYRBETRIQ) 25 MG TB24 tablet Take 1 tablet (25 mg total) by mouth daily. Patient not taking: Reported on 06/10/2020 01/14/20   Tegeler, Gwenyth Allegra, MD  pantoprazole (PROTONIX) 40 MG tablet Take 1 tablet (40 mg total) by mouth 2 (two) times daily. 08/09/19   Black, Lezlie Octave, NP  venlafaxine XR (EFFEXOR-XR) 75 MG 24 hr capsule Take 75 mg by mouth daily with breakfast.    [provider]    Family History Family History  Problem Relation Age of Onset  . Hypertension Mother   . Dementia Mother   . Cancer Sister   . Diabetes Sister   . Diabetes Brother   . Arthritis/Rheumatoid Sister     Social History Social History   Tobacco Use  . Smoking status: Former Smoker    Quit date: 03/23/2001    Years since quitting: 19.4  . Smokeless tobacco: Never Used  Substance Use Topics  . Alcohol use: No  . Drug use: No  Allergies   Oxycontin [oxycodone] and Gadolinium derivatives   Review of Systems Review of Systems  Musculoskeletal: Positive for arthralgias and joint swelling.  All other systems reviewed and are negative.    Physical Exam Triage Vital Signs ED Triage Vitals  Enc Vitals Group     BP 08/18/20 1520 (!) 163/75     Pulse Rate 08/18/20 1520 66     Resp 08/18/20 1520 16     Temp 08/18/20 1520 97.9 F (36.6 C)     Temp Source 08/18/20 1520 Oral     SpO2 08/18/20 1520 96 %     Weight --      Height --      Head Circumference --      Peak Flow --      Pain Score 08/18/20 1521 6     Pain Loc --      Pain Edu? --      Excl. in [place]? --    No data found.  Updated Vital Signs BP (!) 163/75 (BP Location: Right Arm)   Pulse 66   Temp 97.9 F (36.6 C) (Oral)   Resp 16   SpO2 96%   Visual Acuity Right Eye  Distance:   Left Eye Distance:   Bilateral Distance:    Right Eye Near:   Left Eye Near:    Bilateral Near:     Physical Exam Vitals and nursing note reviewed.  Constitutional:      General: She is not in acute distress.    Appearance: Normal appearance. She is not ill-appearing, toxic-appearing or diaphoretic.  HENT:     Head: Normocephalic and atraumatic.  Eyes:     Conjunctiva/sclera: Conjunctivae normal.  Cardiovascular:     Rate and Rhythm: Normal rate.     Pulses: Normal pulses.  Pulmonary:     Effort: Pulmonary effort is normal.  Abdominal:     General: Abdomen is flat.  Musculoskeletal:     Right hand: Swelling, tenderness and bony tenderness present. Decreased range of motion. Normal strength. Normal sensation. There is no disruption of two-point discrimination. Normal capillary refill. Normal pulse.     Cervical back: Normal range of motion.  Skin:    General: Skin is warm and dry.  Neurological:     General: No focal deficit present.     Mental Status: She is alert and oriented to person, place, and time.  Psychiatric:        Mood and Affect: Mood normal.      UC Treatments / Results  Labs (all labs ordered are listed, but only abnormal results are displayed) Labs Reviewed - No data to display  EKG   Radiology DG Hand Complete Right  Result Date: 08/18/2020 CLINICAL DATA:  Recent fall with right hand pain and swelling, initial encounter EXAM: RIGHT HAND - COMPLETE 3+ VIEW COMPARISON:  None. FINDINGS: Degenerative changes of the first [facility] joint are noted. Additionally there is lucency in the base of the first metacarpal proximally. Possibility of an undisplaced fracture would deserve consideration although the lucency is only seen on 1 of the 3 images. Interphalangeal degenerative changes are noted. No other fracture is seen. IMPRESSION: Degenerative changes as described. Vague lucency is noted at the base of the first metacarpal. This is suspicious for an  undisplaced fracture although seen on only 1 of the 3 images. Correlation to point tenderness is recommended. Electronically Signed   By: Inez Catalina M.D.   On: 08/18/2020 15:52  Procedures Procedures (including critical care time)  Medications Ordered in UC Medications - No data to display  Initial Impression / Assessment and Plan / UC Course  I have reviewed the triage vital signs and the nursing notes.  Pertinent labs & imaging results that were available during my care of the patient were reviewed by me and considered in my medical decision making (see chart for details).    Assessment negative for red flags or concerns. Xray shows degenerative changes and concern for possible undisplaced fracture to the first metacarpal.  Patient does not have any point tenderness on the first metacarpal.  Ace bandage applied in office and patient may wear it for comfort.  Recommend Ibuprofen and/or Tylenol as needed for pain and fevers.  Recommend rest, ice, compression, and elevation.  Follow up with sports medicine or orthopedics if symptoms do not improve in the next few days.  Final Clinical Impressions(s) / UC Diagnoses   Final diagnoses:  Injury of right hand, initial encounter     Discharge Instructions     Take Ibuprofen and/or Tylenol as needed for pain relief and fever reduction.   Rest as much as possible Ice for 10-15 minutes every 4-6 hours as needed for pain and swelling Compression- use an ace bandage or splint for comfort Elevate above your hip/heart when sitting and laying down  Follow up with sports medicine or orthopedics if symptoms do not improve in the next few days.      ED Prescriptions    None     PDMP not reviewed this encounter.   Pearson Forster, NP 08/18/20 [phone]

## 2020-08-18 NOTE — Discharge Instructions (Signed)
Take Ibuprofen and/or Tylenol as needed for pain relief and fever reduction.   Rest as much as possible Ice for 10-15 minutes every 4-6 hours as needed for pain and swelling Compression- use an ace bandage or splint for comfort Elevate above your hip/heart when sitting and laying down  Follow up with sports medicine or orthopedics if symptoms do not improve in the next few days.

## 2020-08-18 NOTE — ED Triage Notes (Signed)
Pt present right hand injury from a fall today.  Pt states she was getting up out a chair and stumble and fell.

## 2020-08-20 DIAGNOSIS — G4700 Insomnia, unspecified: Secondary | ICD-10-CM | POA: Diagnosis not present

## 2020-08-20 DIAGNOSIS — M170 Bilateral primary osteoarthritis of knee: Secondary | ICD-10-CM | POA: Diagnosis not present

## 2020-08-20 DIAGNOSIS — Z23 Encounter for immunization: Secondary | ICD-10-CM | POA: Diagnosis not present

## 2020-08-20 DIAGNOSIS — K219 Gastro-esophageal reflux disease without esophagitis: Secondary | ICD-10-CM | POA: Diagnosis not present

## 2020-08-21 ENCOUNTER — Ambulatory Visit: Payer: Medicare Other | Attending: Physician Assistant | Admitting: Physical Therapy

## 2020-08-21 DIAGNOSIS — R2681 Unsteadiness on feet: Secondary | ICD-10-CM | POA: Insufficient documentation

## 2020-08-21 DIAGNOSIS — R262 Difficulty in walking, not elsewhere classified: Secondary | ICD-10-CM | POA: Insufficient documentation

## 2020-08-21 DIAGNOSIS — M545 Low back pain, unspecified: Secondary | ICD-10-CM | POA: Insufficient documentation

## 2020-08-21 DIAGNOSIS — M25562 Pain in left knee: Secondary | ICD-10-CM | POA: Insufficient documentation

## 2020-08-21 DIAGNOSIS — R29898 Other symptoms and signs involving the musculoskeletal system: Secondary | ICD-10-CM | POA: Insufficient documentation

## 2020-08-21 DIAGNOSIS — M25561 Pain in right knee: Secondary | ICD-10-CM | POA: Insufficient documentation

## 2020-08-21 DIAGNOSIS — M6281 Muscle weakness (generalized): Secondary | ICD-10-CM | POA: Insufficient documentation

## 2020-08-27 ENCOUNTER — Ambulatory Visit: Payer: Medicare Other | Admitting: Physical Therapy

## 2020-08-27 DIAGNOSIS — M79641 Pain in right hand: Secondary | ICD-10-CM | POA: Diagnosis not present

## 2020-08-28 DIAGNOSIS — Z6832 Body mass index (BMI) 32.0-32.9, adult: Secondary | ICD-10-CM | POA: Diagnosis not present

## 2020-08-28 DIAGNOSIS — M170 Bilateral primary osteoarthritis of knee: Secondary | ICD-10-CM | POA: Diagnosis not present

## 2020-08-28 DIAGNOSIS — M1120 Other chondrocalcinosis, unspecified site: Secondary | ICD-10-CM | POA: Diagnosis not present

## 2020-08-28 DIAGNOSIS — E669 Obesity, unspecified: Secondary | ICD-10-CM | POA: Diagnosis not present

## 2020-08-28 DIAGNOSIS — R29898 Other symptoms and signs involving the musculoskeletal system: Secondary | ICD-10-CM | POA: Diagnosis not present

## 2020-08-28 DIAGNOSIS — M7989 Other specified soft tissue disorders: Secondary | ICD-10-CM | POA: Diagnosis not present

## 2020-08-28 DIAGNOSIS — M0600 Rheumatoid arthritis without rheumatoid factor, unspecified site: Secondary | ICD-10-CM | POA: Diagnosis not present

## 2020-09-03 ENCOUNTER — Encounter: Payer: Medicare Other | Admitting: Psychology

## 2020-09-04 ENCOUNTER — Ambulatory Visit: Payer: Medicare Other | Admitting: Physical Therapy

## 2020-09-04 ENCOUNTER — Encounter: Payer: Self-pay | Admitting: Physical Therapy

## 2020-09-04 DIAGNOSIS — M25561 Pain in right knee: Secondary | ICD-10-CM

## 2020-09-04 DIAGNOSIS — M545 Low back pain, unspecified: Secondary | ICD-10-CM | POA: Diagnosis not present

## 2020-09-04 DIAGNOSIS — M6281 Muscle weakness (generalized): Secondary | ICD-10-CM | POA: Diagnosis not present

## 2020-09-04 DIAGNOSIS — R29898 Other symptoms and signs involving the musculoskeletal system: Secondary | ICD-10-CM

## 2020-09-04 DIAGNOSIS — R262 Difficulty in walking, not elsewhere classified: Secondary | ICD-10-CM | POA: Diagnosis not present

## 2020-09-04 DIAGNOSIS — M25562 Pain in left knee: Secondary | ICD-10-CM | POA: Diagnosis not present

## 2020-09-04 DIAGNOSIS — R2681 Unsteadiness on feet: Secondary | ICD-10-CM

## 2020-09-04 NOTE — Therapy (Signed)
[facility] [address] [place], [address] Phone: [phone]   Fax:  [phone]  Physical Therapy Treatment  Patient Details  Name: [name] MRN: [id number] Date of Birth: 21-Jan-1946 Referring Provider (PT): Erline Hau, [place]   Encounter Date: 09/04/2020   PT End of Session - 09/04/20 1321     Visit Number 22    Number of Visits 24    Date for PT Re-Evaluation 09/18/20    Authorization Type Medicare reporting period from 07/31/2020    Progress Note Due on Visit 30    PT Start Time 1305    PT Stop Time 1340    PT Time Calculation (min) 35 min    Equipment Utilized During Treatment Gait belt    Activity Tolerance Patient tolerated treatment well;Patient limited by fatigue    Behavior During Therapy [facility] for tasks assessed/performed   repetative conversation            Past Medical History:  Diagnosis Date   Anxiety    Arthritis    Bilateral chronic knee pain    Chronic back pain    Depression    GERD (gastroesophageal reflux disease)    Insomnia    Memory changes    Sinus bradycardia    Urge incontinence     Past Surgical History:  Procedure Laterality Date   ABDOMINAL HYSTERECTOMY  1995   back sugery     lumbar   BIOPSY THYROID     benign goiter   BUNIONECTOMY  2013   rt foot   COLONOSCOPY     MUSCLE BIOPSY Left 10/03/2012   Procedure: LEFT QUADRICEP MUSCLE BIOPSY;  Surgeon: Odis Hollingshead, MD;  Location: [facility];  Service: General;  Laterality: Left;   NECK SURGERY  2010   cerv disc fused     There were no vitals filed for this visit.   Subjective Assessment - 09/04/20 1307     Subjective Patient reports she feels exhausted today. Arrives on [facility] and R AFO. Tripped and fell twice since last treatment session. Went to the doctor and had a sprain in the R hand. Is feeling better and swelling has come down. Has had a lot going on at home including  death in the family. Thinks the heat is bothering her today. Reports 5/10 pain in both anterior knee. She has been doing some marching at home and getting on bicycle. she stopped wearing her large knee bace because she thought it was contributing to falls due to being too bulky. She has some new shoe insert from [employer] that she needs to get adjusted (did not bring today).    Pertinent History Patient is a 75 y.o. female who presents to outpatient physical therapy with a referral for medical diagnosis thoracic spine pain, lumbar spine fusion, generalized weakness. This patient's chief complaints consist of weakness and difficulty moving R leg leading to the following functional deficits: increased difficulty with daily tasks and mobility including walking, stairs, getting in and out of the car, grocery shopping, general mobility, fear of falling.. She ambulates with a single point cane and carbon fiber AFO on the R ankle.  Relevant past medical history and comorbidities include anxiety, bilateral chronic knee pain, arthritis, chronic back pain, GERD, memory changes, sinus bradycardia, urge incontinence, R foot bunionectomy (2013), former smoker.   Patient denies hx of cancer, stroke, seizures, lung problem, major cardiac events, diabetes, unexplained weight loss, changes  in bowel or bladder problems, new onset stumbling or dropping things.    Limitations Lifting;Standing;Walking;House hold activities    Diagnostic tests Chest/Lumbar CT report from 01/14/2020: " IMPRESSION:  1. No CT evidence for acute thoracic, abdominal or pelvic injury.  2. No acute fracture involving the thoracic or lumbar spine.  3. Chronic findings as detailed above."    Patient Stated Goals "to get as much out of it as I can to move better"    Currently in Pain? Yes    Pain Score 5     Pain Location Knee    Pain Orientation Right;Left;Anterior              OBJECTIVE FOTO = 50 (09/04/2020); ABC scale = 35.6% (09/04/2020): 6MWT:  622 feet with SPC in left UE, SBA and no stumbles   TREATMENT:  R AFO donned throughout, use of SPC in left UE   Therapeutic exercise: to centralize symptoms and improve ROM, strength, muscular endurance, and activity tolerance required for successful completion of functional activities.  - ambulation around clinic 622 feet in 6 min using SPC and SBA for safety. (seated rest) - seated long arc quad, 2x20 each side, R 5# AW, L 20# AW.  - standing leaning on high plinth: cross marching, 2x10 each side.  - seated R hip abduction/adduction with foot on furniture slider, 2x10 - ambulation ~ 100 feet to vehicle with SBA-CGA  using SPC.    Pt required multimodal cuing for proper technique and to facilitate improved neuromuscular control, strength, range of motion, and functional ability resulting in improved performance and form.   HOME EXERCISE PROGRAM Access Code: [email or web address] URL: [email or web address] Date: 04/16/2020 Prepared by: Rosita Kea   Exercises Standing Toe Taps - 1 x daily - 3 sets - 10 reps Standing Hip Abduction with Counter Support - 1 x daily - 3 sets - 10 reps Sit to Stand with Armchair - 1 x daily - 3 sets     PT Education - 09/04/20 1427     Education Details exercise purpose/form.    Person(s) Educated Patient    Methods Explanation;Demonstration;Tactile cues;Verbal cues    Comprehension Verbalized understanding;Returned demonstration;Verbal cues required;Tactile cues required;Need further instruction              PT Short Term Goals - 06/26/20 2001       PT SHORT TERM GOAL #1   Title Be independent with initial home exercise program for self-management of symptoms.    Baseline To be initiated at visit 2 as appropriate (04/03/2020); provided but not participating (04/24/2020); participating in ambulation and sit <> stand (05/07/2020; 06/26/2020);    Time 2    Period Weeks    Status Partially Met    Target Date 04/17/20               PT  Long Term Goals - 06/26/20 2001       PT LONG TERM GOAL #1   Title Be independent with a long-term home exercise program for self-management of symptoms.    Baseline to be updated at visit 2 as appropriate (04/03/2020); partially participating (05/07/2020; 06/26/2020);    Time 12    Period Weeks    Status Partially Met   TARGET DATE FOR ALL LONG TERM GOALS: 06/26/2020. TARGET DATE FOR UNMET GOALS UPDATED TO 09/18/2020     PT LONG TERM GOAL #2   Title Demonstrate improved FOTO score to equal or greater than 56 by visit #20 demonstrate improvement in overall  condition and self-reported functional ability.    Baseline 48 (04/03/2020); 45 (05/07/2020); 45 (06/26/2020);    Time 12    Period Weeks    Status On-going      PT LONG TERM GOAL #3   Title Patient will improve her 10 MWT speed with her SPC to at least 1.2 m/s to promote better community ambulation and safe crossing of streets.    Baseline to be measured visit 2 (04/03/2020); 0.54 meters/second with SPC and R AFO (04/16/2020): 0.6 meters/second with SPC (05/07/2020);  0.55 meters/second with SPC (06/26/2020);    Time 12    Period Weeks    Status On-going      PT LONG TERM GOAL #4   Title Patient will improve ABC score by 13 percentage points to demonstrate improved self-reported balance.    Baseline 23.8% (04/03/2020); 32.5% (05/07/2020); 26.3% (06/26/2020);    Time 12    Period Weeks    Status Partially Met      PT LONG TERM GOAL #5   Title Patient will complete 5 Times Sit to Stand test from chair height without UE support in equal or less than 14 seconds to improve B LE power and strength for transfers and improved mobility, and demonstrate decreased fall risk (threshold between 12 and 15 for increased fall risk).    Baseline 28 seconds with great difficulty from 18.5 inch plinth with B UE support on mat and knees. Very unstable (04/03/2020); 18 seconds with moderate difficulty from 18.5 inch plinth with B UE support on mat. Mildly unstable.  (05/07/2020); 18 seconds from same surface with similar limitations (06/26/2020);    Time 12    Period Weeks    Status Partially Met      PT LONG TERM GOAL #6   Title Pateint will improve 6 Minute Walk Test distance to equal or greater than 1000 feet with LRAD to demonstrate improved activity tolerance and endurance for community mobility and participation.    Baseline to be tested visit 2 (04/04/2019); 475 feet with SPC with SBA. (04/16/2020); 562 feet with SPC and CGA (05/07/2020); 454 feet with SPC and CGA and at least two stumbles (06/26/2020);    Time 12    Period Weeks    Status On-going                   Plan - 09/04/20 1427     Clinical Impression Statement Patient tolerated treatment well overall and demonstrated improved walking distance and FOTO score this session. Has been walking at home and performance in clinic today reflects that. Continues to have difficulty with use of R LE that inhibits functional mobility and increases fall risk. Provided handout of scheduled PT appointments for June and July. Patient would benefit from continued management of limiting condition by skilled physical therapist to address remaining impairments and functional limitations to work towards stated goals and return to PLOF or maximal functional independence.    Personal Factors and Comorbidities Age;Comorbidity 3+;Education;Past/Current Experience;Fitness;Time since onset of injury/illness/exacerbation;Social Background    Comorbidities Relevant past medical history and comorbidities include anxiety, bilateral chronic knee pain, arthritis, chronic back pain, GERD, memory changes, sinus bradycardia, urge incontinence, R foot bunionectomy (2013), former smoker.    Examination-Activity Limitations Bed Mobility;Lift;Stairs;Squat;Bend;Locomotion Level;Stand;Caring for Others;Carry;Transfers;Dressing    Examination-Participation Restrictions Laundry;Church;Cleaning;Shop;Community Activity;Meal  Prep;Driving;Yard Work    Product manager    Rehab Potential Fair    PT Frequency 2x / week    PT Duration 12 weeks  PT Treatment/Interventions ADLs/Self Care Home Management;Aquatic Therapy;Cryotherapy;Moist Heat;Functional mobility training;Stair training;Gait training;DME Instruction;Therapeutic activities;Therapeutic exercise;Balance training;Orthotic Fit/Training;Neuromuscular re-education;Patient/family education;Manual techniques;Passive range of motion;Dry needling;Electrical Stimulation;Energy conservation;Joint Manipulations;Spinal Manipulations;Taping;Splinting    PT Next Visit Plan strengthening/balance exercises    PT Home Exercise Plan [email or web address] Access Code: [email or web address]    Consulted and Agree with Plan of Care Patient             Patient will benefit from skilled therapeutic intervention in order to improve the following deficits and impairments:  Abnormal gait, Decreased knowledge of use of DME, Impaired sensation, Improper body mechanics, Pain, Decreased coordination, Decreased mobility, Impaired tone, Postural dysfunction, Decreased activity tolerance, Decreased endurance, Decreased range of motion, Decreased strength, Impaired perceived functional ability, Difficulty walking, Decreased balance  Visit Diagnosis: Difficulty in walking, not elsewhere classified  Muscle weakness (generalized)  Other symptoms and signs involving the musculoskeletal system  Bilateral low back pain without sciatica, unspecified chronicity  Unsteadiness on feet  Right knee pain, unspecified chronicity  Left knee pain, unspecified chronicity     Problem List Patient Active Problem List   Diagnosis Date Noted   Chronic back pain 08/07/2019   Depression 08/07/2019   Near syncope 08/06/2019   Sinus bradycardia 08/06/2019   Weakness 11/03/2012    Everlean Alstrom. Graylon Good, PT, DPT 09/04/20, 2:30 PM   [facility] [address], [place], [address] Phone: [phone]   Fax:  [phone]  Name: [name] MRN: [id number] Date of Birth: [birth year]

## 2020-09-06 ENCOUNTER — Encounter: Payer: Medicare Other | Admitting: Psychology

## 2020-09-10 ENCOUNTER — Encounter: Payer: Medicare Other | Admitting: Physical Therapy

## 2020-09-11 ENCOUNTER — Other Ambulatory Visit: Payer: Self-pay

## 2020-09-11 ENCOUNTER — Encounter: Payer: Medicare Other | Admitting: Psychology

## 2020-09-11 ENCOUNTER — Ambulatory Visit: Payer: Medicare Other | Admitting: Physical Therapy

## 2020-09-11 DIAGNOSIS — M6281 Muscle weakness (generalized): Secondary | ICD-10-CM | POA: Diagnosis not present

## 2020-09-11 DIAGNOSIS — R2681 Unsteadiness on feet: Secondary | ICD-10-CM

## 2020-09-11 DIAGNOSIS — R262 Difficulty in walking, not elsewhere classified: Secondary | ICD-10-CM

## 2020-09-11 DIAGNOSIS — R29898 Other symptoms and signs involving the musculoskeletal system: Secondary | ICD-10-CM | POA: Diagnosis not present

## 2020-09-11 DIAGNOSIS — M25561 Pain in right knee: Secondary | ICD-10-CM

## 2020-09-11 DIAGNOSIS — M25562 Pain in left knee: Secondary | ICD-10-CM

## 2020-09-11 DIAGNOSIS — M545 Low back pain, unspecified: Secondary | ICD-10-CM | POA: Diagnosis not present

## 2020-09-11 NOTE — Therapy (Signed)
[facility] [address] [place], [address] Phone: [phone]   Fax:  [phone]  Physical Therapy Treatment  Patient Details  Name: [name] MRN: [id number] Date of Birth: 1945/04/28 Referring Provider (PT): Erline Hau, [place]   Encounter Date: 09/11/2020   PT End of Session - 09/11/20 1136     Visit Number 23    Number of Visits 24    Date for PT Re-Evaluation 09/18/20    Authorization Type Medicare reporting period from 07/31/2020    Progress Note Due on Visit 30    PT Start Time 1126    PT Stop Time 1155    PT Time Calculation (min) 29 min    Equipment Utilized During Treatment Gait belt    Activity Tolerance Patient tolerated treatment well;Patient limited by fatigue    Behavior During Therapy [facility] for tasks assessed/performed   repetative conversation            Past Medical History:  Diagnosis Date   Anxiety    Arthritis    Bilateral chronic knee pain    Chronic back pain    Depression    GERD (gastroesophageal reflux disease)    Insomnia    Memory changes    Sinus bradycardia    Urge incontinence     Past Surgical History:  Procedure Laterality Date   ABDOMINAL HYSTERECTOMY  1995   back sugery     lumbar   BIOPSY THYROID     benign goiter   BUNIONECTOMY  2013   rt foot   COLONOSCOPY     MUSCLE BIOPSY Left 10/03/2012   Procedure: LEFT QUADRICEP MUSCLE BIOPSY;  Surgeon: Odis Hollingshead, MD;  Location: [place];  Service: General;  Laterality: Left;   NECK SURGERY  2010   cerv disc fused     There were no vitals filed for this visit.   Subjective Assessment - 09/11/20 1137     Subjective Patinet arrives 10 min late using SPC in left UE and no R AFO. States she has no pain, no falls since last session, and her hand is getting better. States she felt okay after last PT session. State she is not wearing her R AFO because the shoes that work with it  are drying after washing them. States she has an eye surgery scheduled for 09/17/20 (pre-op tomorrow). Does not know what activity restrictions she may have after. Has continued to work on walking and being more active at home. States she could not find her schedule so she had to call in this morning to find out when her appointment was.    Pertinent History Patient is a 75 y.o. female who presents to outpatient physical therapy with a referral for medical diagnosis thoracic spine pain, lumbar spine fusion, generalized weakness. This patient's chief complaints consist of weakness and difficulty moving R leg leading to the following functional deficits: increased difficulty with daily tasks and mobility including walking, stairs, getting in and out of the car, grocery shopping, general mobility, fear of falling.. She ambulates with a single point cane and carbon fiber AFO on the R ankle.  Relevant past medical history and comorbidities include anxiety, bilateral chronic knee pain, arthritis, chronic back pain, GERD, memory changes, sinus bradycardia, urge incontinence, R foot bunionectomy (2013), former smoker.   Patient denies hx of cancer, stroke, seizures, lung problem, major cardiac events, diabetes, unexplained weight loss, changes in bowel or bladder  problems, new onset stumbling or dropping things.    Limitations Lifting;Standing;Walking;House hold activities    Diagnostic tests Chest/Lumbar CT report from 01/14/2020: " IMPRESSION:  1. No CT evidence for acute thoracic, abdominal or pelvic injury.  2. No acute fracture involving the thoracic or lumbar spine.  3. Chronic findings as detailed above."    Patient Stated Goals "to get as much out of it as I can to move better"    Currently in Pain? No/denies              OBJECTIVE FOTO = 50 (09/04/2020); ABC scale = 35.6% (09/04/2020): 6MWT: 622 feet with SPC in left UE, SBA and no stumbles   TREATMENT:  No AFO today due to shoe she needs to use is  wet. use of SPC in left UE   Therapeutic exercise: to centralize symptoms and improve ROM, strength, muscular endurance, and activity tolerance required for successful completion of functional activities.  - seated long arc quad, 2x20 each side, R 5# AW, L 20# AW.  - standing leaning on high plinth: cross marching, 3x10 each side.  - seated R hip abduction/adduction with foot on furniture slider, 3x15, tactile  - Education on HEP including handout  - ambulation ~ 100 feet to vehicle with SBA-CGA  using SPC.    Pt required multimodal cuing for proper technique and to facilitate improved neuromuscular control, strength, range of motion, and functional ability resulting in improved performance and form.   HOME EXERCISE PROGRAM Access Code: [email or web address] URL: [email or web address] Date: 09/11/2020 Prepared by: Rosita Kea  Exercises Standing Toe Taps - 1 x daily - 3 sets - 10 reps Standing Hip Abduction with Counter Support - 1 x daily - 3 sets - 10 reps Sit to Stand with Armchair - 1 x daily - 3 sets - 5 reps Seated Single Leg Hip Abduction - 1 x daily - 3 sets - 10-20 reps - 1-5 seconds hold   PT Education - 09/11/20 1141     Education Details exercise purpose/form.    Person(s) Educated Patient    Methods Explanation;Demonstration;Tactile cues;Verbal cues    Comprehension Verbalized understanding;Returned demonstration;Verbal cues required;Tactile cues required;Need further instruction              PT Short Term Goals - 06/26/20 2001       PT SHORT TERM GOAL #1   Title Be independent with initial home exercise program for self-management of symptoms.    Baseline To be initiated at visit 2 as appropriate (04/03/2020); provided but not participating (04/24/2020); participating in ambulation and sit <> stand (05/07/2020; 06/26/2020);    Time 2    Period Weeks    Status Partially Met    Target Date 04/17/20               PT Long Term Goals - 06/26/20 2001        PT LONG TERM GOAL #1   Title Be independent with a long-term home exercise program for self-management of symptoms.    Baseline to be updated at visit 2 as appropriate (04/03/2020); partially participating (05/07/2020; 06/26/2020);    Time 12    Period Weeks    Status Partially Met   TARGET DATE FOR ALL LONG TERM GOALS: 06/26/2020. TARGET DATE FOR UNMET GOALS UPDATED TO 09/18/2020     PT LONG TERM GOAL #2   Title Demonstrate improved FOTO score to equal or greater than 56 by visit #20 demonstrate improvement in overall condition and self-reported functional  ability.    Baseline 48 (04/03/2020); 45 (05/07/2020); 45 (06/26/2020);    Time 12    Period Weeks    Status On-going      PT LONG TERM GOAL #3   Title Patient will improve her 10 MWT speed with her SPC to at least 1.2 m/s to promote better community ambulation and safe crossing of streets.    Baseline to be measured visit 2 (04/03/2020); 0.54 meters/second with SPC and R AFO (04/16/2020): 0.6 meters/second with SPC (05/07/2020);  0.55 meters/second with SPC (06/26/2020);    Time 12    Period Weeks    Status On-going      PT LONG TERM GOAL #4   Title Patient will improve ABC score by 13 percentage points to demonstrate improved self-reported balance.    Baseline 23.8% (04/03/2020); 32.5% (05/07/2020); 26.3% (06/26/2020);    Time 12    Period Weeks    Status Partially Met      PT LONG TERM GOAL #5   Title Patient will complete 5 Times Sit to Stand test from chair height without UE support in equal or less than 14 seconds to improve B LE power and strength for transfers and improved mobility, and demonstrate decreased fall risk (threshold between 12 and 15 for increased fall risk).    Baseline 28 seconds with great difficulty from 18.5 inch plinth with B UE support on mat and knees. Very unstable (04/03/2020); 18 seconds with moderate difficulty from 18.5 inch plinth with B UE support on mat. Mildly unstable. (05/07/2020); 18 seconds from same surface with  similar limitations (06/26/2020);    Time 12    Period Weeks    Status Partially Met      PT LONG TERM GOAL #6   Title Pateint will improve 6 Minute Walk Test distance to equal or greater than 1000 feet with LRAD to demonstrate improved activity tolerance and endurance for community mobility and participation.    Baseline to be tested visit 2 (04/04/2019); 475 feet with SPC with SBA. (04/16/2020); 562 feet with SPC and CGA (05/07/2020); 454 feet with SPC and CGA and at least two stumbles (06/26/2020);    Time 12    Period Weeks    Status On-going                   Plan - 09/11/20 1208     Clinical Impression Statement Patient tolerated treatment well this session with no increase in pain. Reviewed upcoming visits with PT and provided handout for appointments scheduled in July. Continued to work on hip and knee strengthening today but minimized walking activities due to not having AFO today. Patient would benefit from continued management of limiting condition by skilled physical therapist to address remaining impairments and functional limitations to work towards stated goals and return to PLOF or maximal functional independence.    Personal Factors and Comorbidities Age;Comorbidity 3+;Education;Past/Current Experience;Fitness;Time since onset of injury/illness/exacerbation;Social Background    Comorbidities Relevant past medical history and comorbidities include anxiety, bilateral chronic knee pain, arthritis, chronic back pain, GERD, memory changes, sinus bradycardia, urge incontinence, R foot bunionectomy (2013), former smoker.    Examination-Activity Limitations Bed Mobility;Lift;Stairs;Squat;Bend;Locomotion Level;Stand;Caring for Others;Carry;Transfers;Dressing    Examination-Participation Restrictions Laundry;Church;Cleaning;Shop;Community Activity;Meal Prep;Driving;Yard Work    [employer] Evolving/Moderate complexity    Rehab Potential Fair    PT Frequency 2x /  week    PT Duration 12 weeks    PT Treatment/Interventions ADLs/Self Care Home Management;Aquatic Therapy;Cryotherapy;Moist Heat;Functional mobility training;Stair training;Gait training;DME  Instruction;Therapeutic activities;Therapeutic exercise;Balance training;Orthotic Fit/Training;Neuromuscular re-education;Patient/family education;Manual techniques;Passive range of motion;Dry needling;Electrical Stimulation;Energy conservation;Joint Manipulations;Spinal Manipulations;Taping;Splinting    PT Next Visit Plan strengthening/balance exercises    PT Home Exercise Plan [email or web address] Access Code: [email or web address]    Consulted and Agree with Plan of Care Patient             Patient will benefit from skilled therapeutic intervention in order to improve the following deficits and impairments:  Abnormal gait, Decreased knowledge of use of DME, Impaired sensation, Improper body mechanics, Pain, Decreased coordination, Decreased mobility, Impaired tone, Postural dysfunction, Decreased activity tolerance, Decreased endurance, Decreased range of motion, Decreased strength, Impaired perceived functional ability, Difficulty walking, Decreased balance  Visit Diagnosis: Difficulty in walking, not elsewhere classified  Muscle weakness (generalized)  Other symptoms and signs involving the musculoskeletal system  Bilateral low back pain without sciatica, unspecified chronicity  Unsteadiness on feet  Right knee pain, unspecified chronicity  Left knee pain, unspecified chronicity     Problem List Patient Active Problem List   Diagnosis Date Noted   Chronic back pain 08/07/2019   Depression 08/07/2019   Near syncope 08/06/2019   Sinus bradycardia 08/06/2019   Weakness 11/03/2012    Everlean Alstrom. Graylon Good, PT, DPT 09/11/20, 12:08 PM   [facility] [address], [place], [address] Phone: [phone]   Fax:  [phone]  Name: ASHLYND MICHNA MRN: [id number] Date of Birth: January 19, 1946

## 2020-09-17 DIAGNOSIS — H02831 Dermatochalasis of right upper eyelid: Secondary | ICD-10-CM | POA: Diagnosis not present

## 2020-09-17 DIAGNOSIS — H02834 Dermatochalasis of left upper eyelid: Secondary | ICD-10-CM | POA: Diagnosis not present

## 2020-09-17 DIAGNOSIS — H04123 Dry eye syndrome of bilateral lacrimal glands: Secondary | ICD-10-CM | POA: Diagnosis not present

## 2020-09-17 DIAGNOSIS — H11133 Conjunctival pigmentations, bilateral: Secondary | ICD-10-CM | POA: Diagnosis not present

## 2020-09-17 DIAGNOSIS — H05223 Edema of bilateral orbit: Secondary | ICD-10-CM | POA: Diagnosis not present

## 2020-09-18 ENCOUNTER — Encounter: Payer: Medicare Other | Admitting: Physical Therapy

## 2020-09-25 ENCOUNTER — Encounter: Payer: Medicare Other | Admitting: Physical Therapy

## 2020-10-02 ENCOUNTER — Ambulatory Visit: Payer: Medicare Other | Attending: Physician Assistant | Admitting: Physical Therapy

## 2020-10-02 DIAGNOSIS — M25562 Pain in left knee: Secondary | ICD-10-CM | POA: Diagnosis not present

## 2020-10-02 DIAGNOSIS — R262 Difficulty in walking, not elsewhere classified: Secondary | ICD-10-CM | POA: Insufficient documentation

## 2020-10-02 DIAGNOSIS — R2681 Unsteadiness on feet: Secondary | ICD-10-CM | POA: Diagnosis not present

## 2020-10-02 DIAGNOSIS — R29898 Other symptoms and signs involving the musculoskeletal system: Secondary | ICD-10-CM | POA: Insufficient documentation

## 2020-10-02 DIAGNOSIS — M6281 Muscle weakness (generalized): Secondary | ICD-10-CM | POA: Diagnosis not present

## 2020-10-02 DIAGNOSIS — M545 Low back pain, unspecified: Secondary | ICD-10-CM | POA: Insufficient documentation

## 2020-10-02 DIAGNOSIS — M25561 Pain in right knee: Secondary | ICD-10-CM | POA: Diagnosis not present

## 2020-10-02 NOTE — Therapy (Signed)
[facility] [address] [place], [address] Phone: [phone]   Fax:  [phone]  Physical Therapy Treatment / Progress Note / Re-Certification  Dates of reporting: 07/31/2020 - 10/02/2020  Patient Details  Name: [name] MRN: [id number] Date of Birth: 15-Feb-1946 Referring Provider (PT): Erline Hau, [place]   Encounter Date: 10/02/2020   PT End of Session - 10/02/20 1357     Visit Number 24    Number of Visits 24    Date for PT Re-Evaluation 12/25/20    Authorization Type Medicare reporting period from 07/31/2020    Progress Note Due on Visit 30    PT Start Time 1311    PT Stop Time 1345    PT Time Calculation (min) 34 min    Equipment Utilized During Treatment Gait belt    Activity Tolerance Patient tolerated treatment well;Patient limited by fatigue;No increased pain    Behavior During Therapy [facility] for tasks assessed/performed   repetative conversation            Past Medical History:  Diagnosis Date   Anxiety    Arthritis    Bilateral chronic knee pain    Chronic back pain    Depression    GERD (gastroesophageal reflux disease)    Insomnia    Memory changes    Sinus bradycardia    Urge incontinence     Past Surgical History:  Procedure Laterality Date   ABDOMINAL HYSTERECTOMY  1995   back sugery     lumbar   BIOPSY THYROID     benign goiter   BUNIONECTOMY  2013   rt foot   COLONOSCOPY     MUSCLE BIOPSY Left 10/03/2012   Procedure: LEFT QUADRICEP MUSCLE BIOPSY;  Surgeon: Odis Hollingshead, MD;  Location: [facility];  Service: General;  Laterality: Left;   NECK SURGERY  2010   cerv disc fused     There were no vitals filed for this visit.   Subjective Assessment - 10/02/20 1311     Subjective Patient arrives ~ 10 min late on SPC and R AFO. States her L hip is hurting up to 10/10. States this started when she stepped wrong getting out of the shower 2 days  ago. States the pain was in her lower leg and then moved up to the hip. No falls since last PT session. Reports pain in the left glute region. She is practicing her walking at home.    Pertinent History Patient is a 75 y.o. female who presents to outpatient physical therapy with a referral for medical diagnosis thoracic spine pain, lumbar spine fusion, generalized weakness. This patient's chief complaints consist of weakness and difficulty moving R leg leading to the following functional deficits: increased difficulty with daily tasks and mobility including walking, stairs, getting in and out of the car, grocery shopping, general mobility, fear of falling.. She ambulates with a single point cane and carbon fiber AFO on the R ankle.  Relevant past medical history and comorbidities include anxiety, bilateral chronic knee pain, arthritis, chronic back pain, GERD, memory changes, sinus bradycardia, urge incontinence, R foot bunionectomy (2013), former smoker.   Patient denies hx of cancer, stroke, seizures, lung problem, major cardiac events, diabetes, unexplained weight loss, changes in bowel or bladder problems, new onset stumbling or dropping things.    Limitations Lifting;Standing;Walking;House hold activities    Diagnostic tests Chest/Lumbar CT report from 01/14/2020: " IMPRESSION:  1. No  CT evidence for acute thoracic, abdominal or pelvic injury.  2. No acute fracture involving the thoracic or lumbar spine.  3. Chronic findings as detailed above."    Patient Stated Goals "to get as much out of it as I can to move better"    Currently in Pain? Yes    Pain Score 10-Worst pain ever               [facility] PT Assessment - 10/02/20 0001       Assessment   Medical Diagnosis pain in thoracic spine, fusion of lumbar spine, muscle weakness    Referring Provider (PT) Rudean Curt, Louanna Raw, PA-C    Prior Therapy several episodes with improvement in function      Precautions   Precautions Fall      Restrictions    Weight Bearing Restrictions No      Home Environment   Living Environment --   no concerns about getting around home     Prior Function   Level of Independence Independent with household mobility with device;Independent with community mobility with device;Other (comment);Independent with basic ADLs   help with getting things off the floor if she drops something     Cognition   Overall Cognitive Status Within Functional Limits for tasks assessed      Observation/Other Assessments   Focus on Therapeutic Outcomes (FOTO)  46    Other Surveys  Activities of Balance and Confidence Scale    Activities of Balance Confidence Scale (ABC Scale)  23.8%             OBJECTIVE FOTO = 46 (10/02/2020); ABC scale = 23.8% (10/02/2020): 6MWT: 605 feet with SPC in left UE, SBA and 1 stumble pt was able to recover from with stepping strategy - 10MWT: 0.49 m/s with SPC in left UE, SBA for assistance (hindered by left glute pain).   - Five Time Sit to Stand (5TSTS): 27 seconds with moderate difficulty from 18.5 inch plinth with B UE support on mat. Mildly unstable and did not stand up fully last rep. Limited by left glute pain.   TREATMENT:  R AFO donned throughout, use of SPC in left UE  Therapeutic exercise: to centralize symptoms and improve ROM, strength, muscular endurance, and activity tolerance required for successful completion of functional activities.  - ambulation around clinic for distance in 6 minutes with SPC in LE LE and R AFO donned. One instance of stumbling that patient recovered from without physical assistance using step strategy. - ambulation with SPC 2x30 feet for speed - sit <> stand from 18.5 inch plinth with BUE support and SBA 1x5 for speed.  - standing lumbar extension with table support behind, 1x10 (increased, no worse - continues to hurt during ambulation) - seated lumbar flexion with theraball, 1x20 (feels better during, slightly better after but still pain full taking  steps onto L LE while ambulating out to vehicle).  - ambulation ~ 100 feet to vehicle with CGA  using SPC. - educated on aquatic therapy options.   Pt required multimodal cuing for proper technique and to facilitate improved neuromuscular control, strength, range of motion, and functional ability resulting in improved performance and form. Required close supervision to CGA for safety during standing activities with occasional unsteadiness or stumble that patient was able to recover from with step strategy or by grabbing a nearby wall/object.      PT Education - 10/02/20 1419     Education Details exercise purpose/form. POC, progress, aquatic therapy,  scheduled visits. Provided handout of visits scheduled in the future and information sheet on aquatic therapy.    Person(s) Educated Patient    Methods Explanation;Demonstration;Tactile cues;Verbal cues;Handout    Comprehension Verbalized understanding;Returned demonstration;Verbal cues required;Tactile cues required;Need further instruction              PT Short Term Goals - 10/02/20 1409       PT SHORT TERM GOAL #1   Title Be independent with initial home exercise program for self-management of symptoms.    Baseline To be initiated at visit 2 as appropriate (04/03/2020); provided but not participating (04/24/2020); participating in ambulation and sit <> stand (05/07/2020; 06/26/2020); participating in ambulation practice at home (10/02/2020);    Time 2    Period Weeks    Status Partially Met    Target Date 04/17/20               PT Long Term Goals - 10/02/20 1410       PT LONG TERM GOAL #1   Title Be independent with a long-term home exercise program for self-management of symptoms.    Baseline to be updated at visit 2 as appropriate (04/03/2020); partially participating (05/07/2020; 06/26/2020; 10/02/2020);    Time 12    Period Weeks    Status Partially Met   TARGET DATE FOR ALL LONG TERM GOALS: 06/26/2020. TARGET DATE FOR UNMET GOALS  UPDATED TO 09/18/2020. UPDATED TO 12/25/2020     PT LONG TERM GOAL #2   Title Demonstrate improved FOTO score to equal or greater than 56 by visit #20 demonstrate improvement in overall condition and self-reported functional ability.    Baseline 48 (04/03/2020); 45 (05/07/2020); 45 (06/26/2020); 46 (10/02/20);    Time 12    Period Weeks    Status On-going      PT LONG TERM GOAL #3   Title Patient will improve her 10 MWT speed with her SPC to at least 1.2 m/s to promote better community ambulation and safe crossing of streets.    Baseline to be measured visit 2 (04/03/2020); 0.54 meters/second with SPC and R AFO (04/16/2020): 0.6 meters/second with SPC (05/07/2020);  0.55 meters/second with SPC (06/26/2020); 0.49 m/sec with SPC and AFO (10/02/2020);    Time 12    Period Weeks    Status On-going      PT LONG TERM GOAL #4   Title Patient will improve ABC score by 13 percentage points to demonstrate improved self-reported balance.    Baseline 23.8% (04/03/2020); 32.5% (05/07/2020); 26.3% (06/26/2020); 23.8% (10/02/2020);    Time 12    Period Weeks    Status On-going      PT LONG TERM GOAL #5   Title Patient will complete 5 Times Sit to Stand test from chair height without UE support in equal or less than 14 seconds to improve B LE power and strength for transfers and improved mobility, and demonstrate decreased fall risk (threshold between 12 and 15 for increased fall risk).    Baseline 28 seconds with great difficulty from 18.5 inch plinth with B UE support on mat and knees. Very unstable (04/03/2020); 18 seconds with moderate difficulty from 18.5 inch plinth with B UE support on mat. Mildly unstable. (05/07/2020); 18 seconds from same surface with similar limitations (06/26/2020); 7 seconds with moderate difficulty from 18.5 inch plinth with B UE support on mat. Mildly unstable and did not stand up fully last rep. Limited by left glute pain (10/02/2020);    Time 12  Period Weeks    Status Partially Met       PT LONG TERM GOAL #6   Title Pateint will improve 6 Minute Walk Test distance to equal or greater than 1000 feet with LRAD to demonstrate improved activity tolerance and endurance for community mobility and participation.    Baseline to be tested visit 2 (04/04/2019); 475 feet with SPC with SBA. (04/16/2020); 562 feet with SPC and CGA (05/07/2020); 454 feet with SPC and CGA and at least two stumbles (06/26/2020); 605 feet with SPC in left UE and one stumble (10/02/2020);    Time 12    Period Weeks    Status Partially Met                   Plan - 10/02/20 1417     Clinical Impression Statement Patient has attended 24 physical therapy sessions this episode of care. She has recently been limited in her visit frequency by need to financial limitations and rising gas prices. She has also chronically struggled with remembering/attending appointments. Patient does have some documented mild cognitive decline that may be affecting her attendance and handouts of scheduled visit have been provided at the last few treatment sessions.  She has attended 3 sessions in the last 4 weeks. Patient arrives with new left glute pain she refers to as hip pain that was limiting her performance in functional assessments. She required more time than at lest assessment on 5 Time Sit to Stand Time and reported greatest pain during this activity.  She demonstrated improvement on 6MWT since 06/26/2020 to 605 feet using SPC with one stumble but remains a fall risk. Walking speed on 10MWT slightly reduced to 0.49 m/sec this session (reported glute pain). Patient presents with chronic deficits that improve with consistent PT but worsen when she does not attend regularly, which is demonstrated by current objective measures. She is also more limited today by left glute pain that is likely related to low back based on location and report of centralizing phenomenon by patient. Did attempt to address this pain with time left in session after  functional testing with some mild relief with flexion exercises. Will continue to address as needed at future sessions. Patient has been informed by MD that she will need to participate in PT for the rest of her life to maintain/maximize functional mobility and independence. Continued PT is medically necessary to prevent decline in function and need for higher level of care. Patient would benefit from trial of aquatic therapy in the next few weeks which has become available again recently. She has had good results with this in the past and would likely benefit from it again. Plan to continue PT with inclusion of aquatic therapy as appropriate. Patient would benefit from continued management of limiting condition by skilled physical therapist to address remaining impairments and functional limitations to work towards stated goals and return to PLOF or maximal functional independence.    Personal Factors and Comorbidities Age;Comorbidity 3+;Education;Past/Current Experience;Fitness;Time since onset of injury/illness/exacerbation;Social Background    Comorbidities Relevant past medical history and comorbidities include anxiety, bilateral chronic knee pain, arthritis, chronic back pain, GERD, memory changes, sinus bradycardia, urge incontinence, R foot bunionectomy (2013), former smoker.    Examination-Activity Limitations Bed Mobility;Lift;Stairs;Squat;Bend;Locomotion Level;Stand;Caring for Others;Carry;Transfers;Dressing    Examination-Participation Restrictions Laundry;Church;Cleaning;Shop;Community Activity;Meal Prep;Driving;Yard Work    Product manager    Rehab Potential Fair    PT Frequency 2x / week    PT Duration 12 weeks  PT Treatment/Interventions ADLs/Self Care Home Management;Aquatic Therapy;Cryotherapy;Moist Heat;Functional mobility training;Stair training;Gait training;DME Instruction;Therapeutic activities;Therapeutic exercise;Balance  training;Orthotic Fit/Training;Neuromuscular re-education;Patient/family education;Manual techniques;Passive range of motion;Dry needling;Electrical Stimulation;Energy conservation;Joint Manipulations;Spinal Manipulations;Taping;Splinting    PT Next Visit Plan strengthening/balance exercises, aquatic therapy    PT Home Exercise Plan [email or web address] Access Code: [email or web address]    Consulted and Agree with Plan of Care Patient             Patient will benefit from skilled therapeutic intervention in order to improve the following deficits and impairments:  Abnormal gait, Decreased knowledge of use of DME, Impaired sensation, Improper body mechanics, Pain, Decreased coordination, Decreased mobility, Impaired tone, Postural dysfunction, Decreased activity tolerance, Decreased endurance, Decreased range of motion, Decreased strength, Impaired perceived functional ability, Difficulty walking, Decreased balance  Visit Diagnosis: Difficulty in walking, not elsewhere classified  Muscle weakness (generalized)  Other symptoms and signs involving the musculoskeletal system  Bilateral low back pain without sciatica, unspecified chronicity  Unsteadiness on feet  Right knee pain, unspecified chronicity  Left knee pain, unspecified chronicity     Problem List Patient Active Problem List   Diagnosis Date Noted   Chronic back pain 08/07/2019   Depression 08/07/2019   Near syncope 08/06/2019   Sinus bradycardia 08/06/2019   Weakness 11/03/2012    Everlean Alstrom. Graylon Good, PT, DPT 10/02/20, 2:23 PM   [facility] [address], [place], [address] Phone: [phone]   Fax:  [phone]  Name: [name] MRN: [id number] Date of Birth: Jun 09, 1945

## 2020-10-08 ENCOUNTER — Encounter: Payer: Self-pay | Admitting: Physical Therapy

## 2020-10-08 ENCOUNTER — Ambulatory Visit: Payer: Medicare Other | Admitting: Physical Therapy

## 2020-10-08 DIAGNOSIS — M6281 Muscle weakness (generalized): Secondary | ICD-10-CM

## 2020-10-08 DIAGNOSIS — R262 Difficulty in walking, not elsewhere classified: Secondary | ICD-10-CM

## 2020-10-08 DIAGNOSIS — M25561 Pain in right knee: Secondary | ICD-10-CM | POA: Diagnosis not present

## 2020-10-08 DIAGNOSIS — R2681 Unsteadiness on feet: Secondary | ICD-10-CM

## 2020-10-08 DIAGNOSIS — R29898 Other symptoms and signs involving the musculoskeletal system: Secondary | ICD-10-CM

## 2020-10-08 DIAGNOSIS — M25562 Pain in left knee: Secondary | ICD-10-CM

## 2020-10-08 DIAGNOSIS — M545 Low back pain, unspecified: Secondary | ICD-10-CM

## 2020-10-08 NOTE — Therapy (Signed)
[facility] [address] [place], [address] Phone: [phone]   Fax:  [phone]  Physical Therapy Treatment  Patient Details  Name: [name] MRN: [id number] Date of Birth: 07-Mar-1946 Referring Provider (PT): Erline Hau, [place]   Encounter Date: 10/08/2020   PT End of Session - 10/08/20 1539     Visit Number 25    Number of Visits 48    Date for PT Re-Evaluation 12/25/20    Authorization Type Medicare reporting period from 10/02/2020    Progress Note Due on Visit 30    PT Start Time 1315    PT Stop Time 1345    PT Time Calculation (min) 30 min    Equipment Utilized During Treatment Gait belt    Activity Tolerance Patient tolerated treatment well;Patient limited by fatigue;No increased pain    Behavior During Therapy [facility] for tasks assessed/performed   repetative conversation            Past Medical History:  Diagnosis Date   Anxiety    Arthritis    Bilateral chronic knee pain    Chronic back pain    Depression    GERD (gastroesophageal reflux disease)    Insomnia    Memory changes    Sinus bradycardia    Urge incontinence     Past Surgical History:  Procedure Laterality Date   ABDOMINAL HYSTERECTOMY  1995   back sugery     lumbar   BIOPSY THYROID     benign goiter   BUNIONECTOMY  2013   rt foot   COLONOSCOPY     MUSCLE BIOPSY Left 10/03/2012   Procedure: LEFT QUADRICEP MUSCLE BIOPSY;  Surgeon: Odis Hollingshead, MD;  Location: [place];  Service: General;  Laterality: Left;   NECK SURGERY  2010   cerv disc fused     There were no vitals filed for this visit.   Subjective Assessment - 10/08/20 1315     Subjective Patient reports she has 7/10 pain in her right knee and it has been swollen for about 1.5 weeks. States she is feeling well with no falls since last PT session. Accidently came a day before her appointment was scheduled. Felt okay after last  session. States she continues to walk and perform sit to stands at home. Arrives with SPC in L UE and R AFO donned.    Pertinent History Patient is a 75 y.o. female who presents to outpatient physical therapy with a referral for medical diagnosis thoracic spine pain, lumbar spine fusion, generalized weakness. This patient's chief complaints consist of weakness and difficulty moving R leg leading to the following functional deficits: increased difficulty with daily tasks and mobility including walking, stairs, getting in and out of the car, grocery shopping, general mobility, fear of falling.. She ambulates with a single point cane and carbon fiber AFO on the R ankle.  Relevant past medical history and comorbidities include anxiety, bilateral chronic knee pain, arthritis, chronic back pain, GERD, memory changes, sinus bradycardia, urge incontinence, R foot bunionectomy (2013), former smoker.   Patient denies hx of cancer, stroke, seizures, lung problem, major cardiac events, diabetes, unexplained weight loss, changes in bowel or bladder problems, new onset stumbling or dropping things.    Limitations Lifting;Standing;Walking;House hold activities    Diagnostic tests Chest/Lumbar CT report from 01/14/2020: " IMPRESSION:  1. No CT evidence for acute thoracic, abdominal or pelvic injury.  2. No acute fracture involving  the thoracic or lumbar spine.  3. Chronic findings as detailed above."    Patient Stated Goals "to get as much out of it as I can to move better"    Currently in Pain? Yes    Pain Score 7              OBJECTIVE FOTO = 46 (10/02/2020); ABC scale = 23.8% (10/02/2020):   TREATMENT:  R AFO donned throughout, use of SPC in left UE   Therapeutic exercise: to centralize symptoms and improve ROM, strength, muscular endurance, and activity tolerance required for successful completion of functional activities.  - standing hip abduction with BUE support, 3x10 each side. Cuing to stand up tall for  decreased compensation.  - standing alternating foot taps on step with B UE support (TM bar and SPC), 3x10 each side. 8/6/6 inches. CGA for safety.  - seated B hip abduction, on edge of chair with knees extended and furniture sliders under heels. Upper body braced on chair arms. 3x10 with blue theraband tied at distal thighs, 1x10 with green theraband at distal thighs (too easy). , 1x 5 with green theraband at ankles (pain in knee, too hard).  - seated clam shell (feet together) with green theraband around knees, 3x10-15. Cuing to improve R ROM.  - seated hamstring ball rolls with 3kg med ball, R LE, 3x15 (requires assistance to position foot on/off ball).  - ambulation ~ 100 feet to vehicle with CGA  using SPC.   Pt required multimodal cuing for proper technique and to facilitate improved neuromuscular control, strength, range of motion, and functional ability resulting in improved performance and form. Required close supervision to CGA for safety during standing activities with occasional unsteadiness or stumble that patient was able to recover from with step strategy or by grabbing a nearby wall/object.     PT Education - 10/08/20 1542     Education Details exercise purpose/form. aquatic therapy, scheduled visits. Provided handout of visits scheduled in the future    Person(s) Educated Patient    Methods Explanation;Demonstration;Tactile cues;Verbal cues;Handout    Comprehension Verbalized understanding;Returned demonstration;Verbal cues required;Tactile cues required;Need further instruction              PT Short Term Goals - 10/02/20 1409       PT SHORT TERM GOAL #1   Title Be independent with initial home exercise program for self-management of symptoms.    Baseline To be initiated at visit 2 as appropriate (04/03/2020); provided but not participating (04/24/2020); participating in ambulation and sit <> stand (05/07/2020; 06/26/2020); participating in ambulation practice at home  (10/02/2020);    Time 2    Period Weeks    Status Partially Met    Target Date 04/17/20               PT Long Term Goals - 10/02/20 1410       PT LONG TERM GOAL #1   Title Be independent with a long-term home exercise program for self-management of symptoms.    Baseline to be updated at visit 2 as appropriate (04/03/2020); partially participating (05/07/2020; 06/26/2020; 10/02/2020);    Time 12    Period Weeks    Status Partially Met   TARGET DATE FOR ALL LONG TERM GOALS: 06/26/2020. TARGET DATE FOR UNMET GOALS UPDATED TO 09/18/2020. UPDATED TO 12/25/2020     PT LONG TERM GOAL #2   Title Demonstrate improved FOTO score to equal or greater than 56 by visit #20 demonstrate improvement in overall  condition and self-reported functional ability.    Baseline 48 (04/03/2020); 45 (05/07/2020); 45 (06/26/2020); 46 (10/02/20);    Time 12    Period Weeks    Status On-going      PT LONG TERM GOAL #3   Title Patient will improve her 10 MWT speed with her SPC to at least 1.2 m/s to promote better community ambulation and safe crossing of streets.    Baseline to be measured visit 2 (04/03/2020); 0.54 meters/second with SPC and R AFO (04/16/2020): 0.6 meters/second with SPC (05/07/2020);  0.55 meters/second with SPC (06/26/2020); 0.49 m/sec with SPC and AFO (10/02/2020);    Time 12    Period Weeks    Status On-going      PT LONG TERM GOAL #4   Title Patient will improve ABC score by 13 percentage points to demonstrate improved self-reported balance.    Baseline 23.8% (04/03/2020); 32.5% (05/07/2020); 26.3% (06/26/2020); 23.8% (10/02/2020);    Time 12    Period Weeks    Status On-going      PT LONG TERM GOAL #5   Title Patient will complete 5 Times Sit to Stand test from chair height without UE support in equal or less than 14 seconds to improve B LE power and strength for transfers and improved mobility, and demonstrate decreased fall risk (threshold between 12 and 15 for increased fall risk).    Baseline 28  seconds with great difficulty from 18.5 inch plinth with B UE support on mat and knees. Very unstable (04/03/2020); 18 seconds with moderate difficulty from 18.5 inch plinth with B UE support on mat. Mildly unstable. (05/07/2020); 18 seconds from same surface with similar limitations (06/26/2020); 7 seconds with moderate difficulty from 18.5 inch plinth with B UE support on mat. Mildly unstable and did not stand up fully last rep. Limited by left glute pain (10/02/2020);    Time 12    Period Weeks    Status Partially Met      PT LONG TERM GOAL #6   Title Pateint will improve 6 Minute Walk Test distance to equal or greater than 1000 feet with LRAD to demonstrate improved activity tolerance and endurance for community mobility and participation.    Baseline to be tested visit 2 (04/04/2019); 475 feet with SPC with SBA. (04/16/2020); 562 feet with SPC and CGA (05/07/2020); 454 feet with SPC and CGA and at least two stumbles (06/26/2020); 605 feet with SPC in left UE and one stumble (10/02/2020);    Time 12    Period Weeks    Status Partially Met                   Plan - 10/08/20 1539     Clinical Impression Statement Patient arrived one day early but late for 1pm appointment time. PT saw patient today per patient preference and schedule availability for 1pm slot but time was limited due to patient being late for that time. Patient reports increased knee pain and swelling this date but noted improvement in less stiffness by end of session and no further pain. Session focused on functional and specific LE strengthening to improve balance and functional mobility. Patient requires cuing and reminders to stay on task at times for rationale for interventions. Continue to plan for starting aquatic therapy in August. Provided patient with handout of most up to date schedule of her future appointments to improve participation. Patient would benefit from continued management of limiting condition by skilled  physical therapist to address remaining  impairments and functional limitations to work towards stated goals and return to PLOF or maximal functional independence.    Personal Factors and Comorbidities Age;Comorbidity 3+;Education;Past/Current Experience;Fitness;Time since onset of injury/illness/exacerbation;Social Background    Comorbidities Relevant past medical history and comorbidities include anxiety, bilateral chronic knee pain, arthritis, chronic back pain, GERD, memory changes, sinus bradycardia, urge incontinence, R foot bunionectomy (2013), former smoker.    Examination-Activity Limitations Bed Mobility;Lift;Stairs;Squat;Bend;Locomotion Level;Stand;Caring for Others;Carry;Transfers;Dressing    Examination-Participation Restrictions Laundry;Church;Cleaning;Shop;Community Activity;Meal Prep;Driving;Yard Work    [employer] Evolving/Moderate complexity    Rehab Potential Fair    PT Frequency 2x / week    PT Duration 12 weeks    PT Treatment/Interventions ADLs/Self Care Home Management;Aquatic Therapy;Cryotherapy;Moist Heat;Functional mobility training;Stair training;Gait training;DME Instruction;Therapeutic activities;Therapeutic exercise;Balance training;Orthotic Fit/Training;Neuromuscular re-education;Patient/family education;Manual techniques;Passive range of motion;Dry needling;Electrical Stimulation;Energy conservation;Joint Manipulations;Spinal Manipulations;Taping;Splinting    PT Next Visit Plan strengthening/balance exercises, aquatic therapy    PT Home Exercise Plan [email or web address] Access Code: [email or web address]    Consulted and Agree with Plan of Care Patient             Patient will benefit from skilled therapeutic intervention in order to improve the following deficits and impairments:  Abnormal gait, Decreased knowledge of use of DME, Impaired sensation, Improper body mechanics, Pain, Decreased coordination, Decreased mobility, Impaired tone, Postural dysfunction,  Decreased activity tolerance, Decreased endurance, Decreased range of motion, Decreased strength, Impaired perceived functional ability, Difficulty walking, Decreased balance  Visit Diagnosis: Difficulty in walking, not elsewhere classified  Muscle weakness (generalized)  Other symptoms and signs involving the musculoskeletal system  Bilateral low back pain without sciatica, unspecified chronicity  Unsteadiness on feet  Right knee pain, unspecified chronicity  Left knee pain, unspecified chronicity     Problem List Patient Active Problem List   Diagnosis Date Noted   Chronic back pain 08/07/2019   Depression 08/07/2019   Near syncope 08/06/2019   Sinus bradycardia 08/06/2019   Weakness 11/03/2012   Everlean Alstrom. Graylon Good, PT, DPT 10/08/20, 3:42 PM  [facility] [address], [place], [address] Phone: [phone]   Fax:  [phone]  Name: [name] MRN: [id number] Date of Birth: 03-Feb-1946

## 2020-10-09 ENCOUNTER — Ambulatory Visit: Payer: Medicare Other | Admitting: Physical Therapy

## 2020-10-09 DIAGNOSIS — Z803 Family history of malignant neoplasm of breast: Secondary | ICD-10-CM | POA: Diagnosis not present

## 2020-10-09 DIAGNOSIS — Z1231 Encounter for screening mammogram for malignant neoplasm of breast: Secondary | ICD-10-CM | POA: Diagnosis not present

## 2020-10-10 ENCOUNTER — Ambulatory Visit (HOSPITAL_BASED_OUTPATIENT_CLINIC_OR_DEPARTMENT_OTHER): Payer: Medicare Other | Admitting: Physical Therapy

## 2020-10-14 ENCOUNTER — Ambulatory Visit (HOSPITAL_BASED_OUTPATIENT_CLINIC_OR_DEPARTMENT_OTHER): Payer: Medicare Other | Admitting: Physical Therapy

## 2020-10-16 ENCOUNTER — Ambulatory Visit: Payer: Medicare Other | Admitting: Physical Therapy

## 2020-10-16 ENCOUNTER — Encounter: Payer: Self-pay | Admitting: Physical Therapy

## 2020-10-16 DIAGNOSIS — R2681 Unsteadiness on feet: Secondary | ICD-10-CM

## 2020-10-16 DIAGNOSIS — M6281 Muscle weakness (generalized): Secondary | ICD-10-CM

## 2020-10-16 DIAGNOSIS — R262 Difficulty in walking, not elsewhere classified: Secondary | ICD-10-CM | POA: Diagnosis not present

## 2020-10-16 DIAGNOSIS — R29898 Other symptoms and signs involving the musculoskeletal system: Secondary | ICD-10-CM | POA: Diagnosis not present

## 2020-10-16 DIAGNOSIS — M545 Low back pain, unspecified: Secondary | ICD-10-CM | POA: Diagnosis not present

## 2020-10-16 DIAGNOSIS — M25561 Pain in right knee: Secondary | ICD-10-CM

## 2020-10-16 DIAGNOSIS — M25562 Pain in left knee: Secondary | ICD-10-CM

## 2020-10-16 NOTE — Therapy (Signed)
[facility] [address] [place], [address] Phone: [phone]   Fax:  [phone]  Physical Therapy Treatment  Patient Details  Name: [name] MRN: [id number] Date of Birth: 1945/06/21 Referring Provider (PT): Erline Hau, [place]   Encounter Date: 10/16/2020   PT End of Session - 10/16/20 1454     Visit Number 26    Number of Visits 48    Date for PT Re-Evaluation 12/25/20    Authorization Type Medicare reporting period from 10/02/2020    Progress Note Due on Visit 30    PT Start Time 1310    PT Stop Time 1345    PT Time Calculation (min) 35 min    Equipment Utilized During Treatment Gait belt    Activity Tolerance Patient tolerated treatment well;Patient limited by fatigue;No increased pain    Behavior During Therapy [facility] for tasks assessed/performed   repetative conversation            Past Medical History:  Diagnosis Date   Anxiety    Arthritis    Bilateral chronic knee pain    Chronic back pain    Depression    GERD (gastroesophageal reflux disease)    Insomnia    Memory changes    Sinus bradycardia    Urge incontinence     Past Surgical History:  Procedure Laterality Date   ABDOMINAL HYSTERECTOMY  1995   back sugery     lumbar   BIOPSY THYROID     benign goiter   BUNIONECTOMY  2013   rt foot   COLONOSCOPY     MUSCLE BIOPSY Left 10/03/2012   Procedure: LEFT QUADRICEP MUSCLE BIOPSY;  Surgeon: Odis Hollingshead, MD;  Location: [facility];  Service: General;  Laterality: Left;   NECK SURGERY  2010   cerv disc fused     There were no vitals filed for this visit.   Subjective Assessment - 10/16/20 1315     Subjective Pateint reports no pain upon arrival and states she felt okay after last PT session. Requests contact info for aquatic therapy location. Reports no falls since last PT session. . Arrives with SPC in L UE and R AFO donned.    Pertinent History  Patient is a 75 y.o. female who presents to outpatient physical therapy with a referral for medical diagnosis thoracic spine pain, lumbar spine fusion, generalized weakness. This patient's chief complaints consist of weakness and difficulty moving R leg leading to the following functional deficits: increased difficulty with daily tasks and mobility including walking, stairs, getting in and out of the car, grocery shopping, general mobility, fear of falling.. She ambulates with a single point cane and carbon fiber AFO on the R ankle.  Relevant past medical history and comorbidities include anxiety, bilateral chronic knee pain, arthritis, chronic back pain, GERD, memory changes, sinus bradycardia, urge incontinence, R foot bunionectomy (2013), former smoker.   Patient denies hx of cancer, stroke, seizures, lung problem, major cardiac events, diabetes, unexplained weight loss, changes in bowel or bladder problems, new onset stumbling or dropping things.    Limitations Lifting;Standing;Walking;House hold activities    Diagnostic tests Chest/Lumbar CT report from 01/14/2020: " IMPRESSION:  1. No CT evidence for acute thoracic, abdominal or pelvic injury.  2. No acute fracture involving the thoracic or lumbar spine.  3. Chronic findings as detailed above."    Patient Stated Goals "to get as much out of it as I  can to move better"    Currently in Pain? No/denies            OBJECTIVE FOTO = 46 (10/02/2020); ABC scale = 23.8% (10/02/2020):   TREATMENT:  R AFO donned throughout, use of SPC in left UE   Therapeutic exercise: to centralize symptoms and improve ROM, strength, muscular endurance, and activity tolerance required for successful completion of functional activities.  - seated long arc quad, 3x20 each side, R 5# AW, L 20# AW.  - standing alternating foot taps on step with B UE support (bar and SPC), 3x10 each side. 6 inches. CGA for safety. - seated B hip abduction, on edge of chair with knees  extended and furniture sliders under heels. Upper body braced on plinth. 3x10/15/15 with blue theraband tied at distal thighs.  - seated R heel slides off edge of plinth with furniture slider under R foot. 2x20 with B UE braced on plinth.  - standing hip diagonal abduction/extension sliding foot on furniture slider with BUE support, 3x10 each side. Verbal cuing to stand up tall for decreased compensation and tactile cuing to keep hips still.  - ambulation ~ 100 feet to vehicle with CGA  using SPC.   Pt required multimodal cuing for proper technique and to facilitate improved neuromuscular control, strength, range of motion, and functional ability resulting in improved performance and form. Required close supervision to CGA for safety during standing activities with unsteadiness noted.     PT Education - 10/16/20 1453     Education Details exercise purpose/form. aquatic therapy, scheduled visits. Provided handout of visits scheduled in the future and a handout of instructions for aquatic therapy including address and phone number where it is located.    Person(s) Educated Patient    Methods Explanation;Demonstration;Tactile cues;Verbal cues;Handout    Comprehension Verbalized understanding;Returned demonstration;Verbal cues required;Tactile cues required;Need further instruction              PT Short Term Goals - 10/02/20 1409       PT SHORT TERM GOAL #1   Title Be independent with initial home exercise program for self-management of symptoms.    Baseline To be initiated at visit 2 as appropriate (04/03/2020); provided but not participating (04/24/2020); participating in ambulation and sit <> stand (05/07/2020; 06/26/2020); participating in ambulation practice at home (10/02/2020);    Time 2    Period Weeks    Status Partially Met    Target Date 04/17/20               PT Long Term Goals - 10/02/20 1410       PT LONG TERM GOAL #1   Title Be independent with a long-term home exercise  program for self-management of symptoms.    Baseline to be updated at visit 2 as appropriate (04/03/2020); partially participating (05/07/2020; 06/26/2020; 10/02/2020);    Time 12    Period Weeks    Status Partially Met   TARGET DATE FOR ALL LONG TERM GOALS: 06/26/2020. TARGET DATE FOR UNMET GOALS UPDATED TO 09/18/2020. UPDATED TO 12/25/2020     PT LONG TERM GOAL #2   Title Demonstrate improved FOTO score to equal or greater than 56 by visit #20 demonstrate improvement in overall condition and self-reported functional ability.    Baseline 48 (04/03/2020); 45 (05/07/2020); 45 (06/26/2020); 46 (10/02/20);    Time 12    Period Weeks    Status On-going      PT LONG TERM GOAL #3   Title Patient will  improve her 10 MWT speed with her SPC to at least 1.2 m/s to promote better community ambulation and safe crossing of streets.    Baseline to be measured visit 2 (04/03/2020); 0.54 meters/second with SPC and R AFO (04/16/2020): 0.6 meters/second with SPC (05/07/2020);  0.55 meters/second with SPC (06/26/2020); 0.49 m/sec with SPC and AFO (10/02/2020);    Time 12    Period Weeks    Status On-going      PT LONG TERM GOAL #4   Title Patient will improve ABC score by 13 percentage points to demonstrate improved self-reported balance.    Baseline 23.8% (04/03/2020); 32.5% (05/07/2020); 26.3% (06/26/2020); 23.8% (10/02/2020);    Time 12    Period Weeks    Status On-going      PT LONG TERM GOAL #5   Title Patient will complete 5 Times Sit to Stand test from chair height without UE support in equal or less than 14 seconds to improve B LE power and strength for transfers and improved mobility, and demonstrate decreased fall risk (threshold between 12 and 15 for increased fall risk).    Baseline 28 seconds with great difficulty from 18.5 inch plinth with B UE support on mat and knees. Very unstable (04/03/2020); 18 seconds with moderate difficulty from 18.5 inch plinth with B UE support on mat. Mildly unstable. (05/07/2020); 18  seconds from same surface with similar limitations (06/26/2020); 7 seconds with moderate difficulty from 18.5 inch plinth with B UE support on mat. Mildly unstable and did not stand up fully last rep. Limited by left glute pain (10/02/2020);    Time 12    Period Weeks    Status Partially Met      PT LONG TERM GOAL #6   Title Pateint will improve 6 Minute Walk Test distance to equal or greater than 1000 feet with LRAD to demonstrate improved activity tolerance and endurance for community mobility and participation.    Baseline to be tested visit 2 (04/04/2019); 475 feet with SPC with SBA. (04/16/2020); 562 feet with SPC and CGA (05/07/2020); 454 feet with SPC and CGA and at least two stumbles (06/26/2020); 605 feet with SPC in left UE and one stumble (10/02/2020);    Time 12    Period Weeks    Status Partially Met                   Plan - 10/16/20 1500     Clinical Impression Statement Patient tolerated treatment well overall and reported feeling significant fatigue in the R LE by end of session. Continues to have difficulty with functional mobility and balance due to chronic R LE weakness and pain in the R knee. Patient provided with education about aquatic therapy and encouragement to go to the location prior to her appointment date to become more familiar with where she is going and also go very early the day of her appointment to allow time for wayfinding and preparation. Patient would benefit from buoyant and viscous properties of water to improve her ability to participate in strengthening exercises without limitations from pain. Patient would benefit from continued management of limiting condition by skilled physical therapist to address remaining impairments and functional limitations to work towards stated goals and return to PLOF or maximal functional independence.    Personal Factors and Comorbidities Age;Comorbidity 3+;Education;Past/Current Experience;Fitness;Time since onset of  injury/illness/exacerbation;Social Background    Comorbidities Relevant past medical history and comorbidities include anxiety, bilateral chronic knee pain, arthritis, chronic back pain, GERD, memory  changes, sinus bradycardia, urge incontinence, R foot bunionectomy (2013), former smoker.    Examination-Activity Limitations Bed Mobility;Lift;Stairs;Squat;Bend;Locomotion Level;Stand;Caring for Others;Carry;Transfers;Dressing    Examination-Participation Restrictions Laundry;Church;Cleaning;Shop;Community Activity;Meal Prep;Driving;Yard Work    [employer] Evolving/Moderate complexity    Rehab Potential Fair    PT Frequency 2x / week    PT Duration 12 weeks    PT Treatment/Interventions ADLs/Self Care Home Management;Aquatic Therapy;Cryotherapy;Moist Heat;Functional mobility training;Stair training;Gait training;DME Instruction;Therapeutic activities;Therapeutic exercise;Balance training;Orthotic Fit/Training;Neuromuscular re-education;Patient/family education;Manual techniques;Passive range of motion;Dry needling;Electrical Stimulation;Energy conservation;Joint Manipulations;Spinal Manipulations;Taping;Splinting    PT Next Visit Plan strengthening/balance exercises, aquatic therapy    PT Home Exercise Plan [email or web address] Access Code: [email or web address]    Consulted and Agree with Plan of Care Patient             Patient will benefit from skilled therapeutic intervention in order to improve the following deficits and impairments:  Abnormal gait, Decreased knowledge of use of DME, Impaired sensation, Improper body mechanics, Pain, Decreased coordination, Decreased mobility, Impaired tone, Postural dysfunction, Decreased activity tolerance, Decreased endurance, Decreased range of motion, Decreased strength, Impaired perceived functional ability, Difficulty walking, Decreased balance  Visit Diagnosis: Difficulty in walking, not elsewhere classified  Muscle weakness  (generalized)  Other symptoms and signs involving the musculoskeletal system  Bilateral low back pain without sciatica, unspecified chronicity  Unsteadiness on feet  Right knee pain, unspecified chronicity  Left knee pain, unspecified chronicity     Problem List Patient Active Problem List   Diagnosis Date Noted   Chronic back pain 08/07/2019   Depression 08/07/2019   Near syncope 08/06/2019   Sinus bradycardia 08/06/2019   Weakness 11/03/2012    Everlean Alstrom. Graylon Good, PT, DPT 10/16/20, 3:00 PM   [facility] [address], [place], [address] Phone: [phone]   Fax:  [phone]  Name: [name] MRN: [id number] Date of Birth: 10/08/1945

## 2020-10-20 DIAGNOSIS — Z20822 Contact with and (suspected) exposure to covid-19: Secondary | ICD-10-CM | POA: Diagnosis not present

## 2020-10-22 ENCOUNTER — Ambulatory Visit (HOSPITAL_BASED_OUTPATIENT_CLINIC_OR_DEPARTMENT_OTHER): Payer: Medicare Other | Admitting: Physical Therapy

## 2020-10-22 ENCOUNTER — Other Ambulatory Visit: Payer: Self-pay

## 2020-10-22 ENCOUNTER — Ambulatory Visit (HOSPITAL_BASED_OUTPATIENT_CLINIC_OR_DEPARTMENT_OTHER): Payer: Medicare Other | Attending: Diagnostic Neuroimaging | Admitting: Physical Therapy

## 2020-10-22 ENCOUNTER — Encounter (HOSPITAL_BASED_OUTPATIENT_CLINIC_OR_DEPARTMENT_OTHER): Payer: Self-pay | Admitting: Physical Therapy

## 2020-10-22 DIAGNOSIS — M25561 Pain in right knee: Secondary | ICD-10-CM | POA: Diagnosis not present

## 2020-10-22 DIAGNOSIS — R2681 Unsteadiness on feet: Secondary | ICD-10-CM

## 2020-10-22 DIAGNOSIS — M6281 Muscle weakness (generalized): Secondary | ICD-10-CM

## 2020-10-22 DIAGNOSIS — R262 Difficulty in walking, not elsewhere classified: Secondary | ICD-10-CM

## 2020-10-22 DIAGNOSIS — M545 Low back pain, unspecified: Secondary | ICD-10-CM

## 2020-10-22 DIAGNOSIS — M25562 Pain in left knee: Secondary | ICD-10-CM

## 2020-10-22 NOTE — Therapy (Signed)
[facility] [address] [place], [place], [address] Phone: [phone]   Fax:  [phone]  Physical Therapy Treatment  Patient Details  Name: ELYSSE [name] MRN: [id number] Date of Birth: Dec 11, 1945 Referring Provider (PT): Erline Hau, [place]   Encounter Date: 10/22/2020   PT End of Session - 10/22/20 2310     Visit Number 27    Number of Visits 48    Progress Note Due on Visit 30    PT Start Time 6222    PT Stop Time 1618    PT Time Calculation (min) 43 min    Equipment Utilized During Treatment Other (comment)   noodle.  Pt used water shoes.   Activity Tolerance Patient tolerated treatment well;No increased pain    Behavior During Therapy WFL for tasks assessed/performed             Past Medical History:  Diagnosis Date   Anxiety    Arthritis    Bilateral chronic knee pain    Chronic back pain    Depression    GERD (gastroesophageal reflux disease)    Insomnia    Memory changes    Sinus bradycardia    Urge incontinence     Past Surgical History:  Procedure Laterality Date   ABDOMINAL HYSTERECTOMY  1995   back sugery     lumbar   BIOPSY THYROID     benign goiter   BUNIONECTOMY  2013   rt foot   COLONOSCOPY     MUSCLE BIOPSY Left 10/03/2012   Procedure: LEFT QUADRICEP MUSCLE BIOPSY;  Surgeon: Odis Hollingshead, MD;  Location: [facility];  Service: General;  Laterality: Left;   NECK SURGERY  2010   cerv disc fused     There were no vitals filed for this visit.   Subjective Assessment - 10/22/20 1529     Subjective Pt states overall she is improving.  Some days are better than others.  Pt states her knees have been bothering her and she is having swelling in R knee.  Pt denies any back pain.  Pt denies having any falls recently and states she can't remember the last time she fell.  Pt has performed Aquatic Therapy in the past prior to Covid-19 pandemic.  Pt reports she hasn't been  consistent with HEP.  Pt states MD informed her she will need to be doing therapy the rest of her life.    Pertinent History Patient is a 75 y.o. female who presents to outpatient physical therapy with a referral for medical diagnosis thoracic spine pain, lumbar spine fusion, generalized weakness. This patient's chief complaints consist of weakness and difficulty moving R leg leading to the following functional deficits: increased difficulty with daily tasks and mobility including walking, stairs, getting in and out of the car, grocery shopping, general mobility, fear of falling.. She ambulates with a single point cane and carbon fiber AFO on the R ankle.  Relevant past medical history and comorbidities include anxiety, bilateral chronic knee pain, arthritis, chronic back pain, GERD, memory changes, sinus bradycardia, urge incontinence, R foot bunionectomy (2013), former smoker.   Patient denies hx of cancer, stroke, seizures, lung problem, major cardiac events, diabetes, unexplained weight loss, changes in bowel or bladder problems, new onset stumbling or dropping things.    Pain Score 6     Pain Location Knee    Pain Orientation Left;Right               -  Pt seen for aquatic therapy today.  Treatment took place in water 3.5-4.5 ft in depth at the [place] [facility]. Temp of water was 93.  Pt entered/exited the pool via stairs with step to gait with bilat rails -Reviewed response to prior Rx, current function, HEP compliance, and pain level. -Introduction to water.  PT had Pt stand at different depths of water in order to feel the buoyancy and how it relates to her sx's.     -Pt ambulated 2 laps with noodle and sidestepped 2 laps with noodle with supervision.   -Pt performed:     -marching, Hip flexion, and Hip abduction 2 x 10 reps each with bilat UE assist on pool edge.     -LE noodle pushdowns with back to wall x 15 reps bilat.      -Balance Activities:  standing with FT x30 seconds,  Tandem stance 2x20 sec each with UE assist on edge of pool, alternate toe tapping on pool step.     -Standing without support bilat shoulder flex/ext and abd/add x 10 reps each.     -Sit to stand from pool bench 2x10 reps  Pt requires buoyancy for support and to offload joints with strengthening exercises. Viscosity of the water is needed for resistance of strengthening; water current perturbations provides challenge to standing balance unsupported, requiring increased core activation.       PT Education - 10/22/20 2308     Education Details Educated pt concerning POC including the process of aquatic and land based PT.  Educated pt on the properties and benefits of aquatic therapy including buoyance and viscosity for improved core and LE strength, balance, and reduced stress on joints.  Instructed pt in correct form with exercises.    Person(s) Educated Patient    Methods Explanation;Demonstration;Verbal cues    Comprehension Verbalized understanding;Returned demonstration;Verbal cues required              PT Short Term Goals - 10/02/20 1409       PT SHORT TERM GOAL #1   Title Be independent with initial home exercise program for self-management of symptoms.    Baseline To be initiated at visit 2 as appropriate (04/03/2020); provided but not participating (04/24/2020); participating in ambulation and sit <> stand (05/07/2020; 06/26/2020); participating in ambulation practice at home (10/02/2020);    Time 2    Period Weeks    Status Partially Met    Target Date 04/17/20               PT Long Term Goals - 10/02/20 1410       PT LONG TERM GOAL #1   Title Be independent with a long-term home exercise program for self-management of symptoms.    Baseline to be updated at visit 2 as appropriate (04/03/2020); partially participating (05/07/2020; 06/26/2020; 10/02/2020);    Time 12    Period Weeks    Status Partially Met   TARGET DATE FOR ALL LONG TERM GOALS: 06/26/2020. TARGET DATE FOR  UNMET GOALS UPDATED TO 09/18/2020. UPDATED TO 12/25/2020     PT LONG TERM GOAL #2   Title Demonstrate improved FOTO score to equal or greater than 56 by visit #20 demonstrate improvement in overall condition and self-reported functional ability.    Baseline 48 (04/03/2020); 45 (05/07/2020); 45 (06/26/2020); 46 (10/02/20);    Time 12    Period Weeks    Status On-going      PT LONG TERM GOAL #3   Title Patient will improve  her 10 MWT speed with her SPC to at least 1.2 m/s to promote better community ambulation and safe crossing of streets.    Baseline to be measured visit 2 (04/03/2020); 0.54 meters/second with SPC and R AFO (04/16/2020): 0.6 meters/second with SPC (05/07/2020);  0.55 meters/second with SPC (06/26/2020); 0.49 m/sec with SPC and AFO (10/02/2020);    Time 12    Period Weeks    Status On-going      PT LONG TERM GOAL #4   Title Patient will improve ABC score by 13 percentage points to demonstrate improved self-reported balance.    Baseline 23.8% (04/03/2020); 32.5% (05/07/2020); 26.3% (06/26/2020); 23.8% (10/02/2020);    Time 12    Period Weeks    Status On-going      PT LONG TERM GOAL #5   Title Patient will complete 5 Times Sit to Stand test from chair height without UE support in equal or less than 14 seconds to improve B LE power and strength for transfers and improved mobility, and demonstrate decreased fall risk (threshold between 12 and 15 for increased fall risk).    Baseline 28 seconds with great difficulty from 18.5 inch plinth with B UE support on mat and knees. Very unstable (04/03/2020); 18 seconds with moderate difficulty from 18.5 inch plinth with B UE support on mat. Mildly unstable. (05/07/2020); 18 seconds from same surface with similar limitations (06/26/2020); 7 seconds with moderate difficulty from 18.5 inch plinth with B UE support on mat. Mildly unstable and did not stand up fully last rep. Limited by left glute pain (10/02/2020);    Time 12    Period Weeks    Status Partially  Met      PT LONG TERM GOAL #6   Title Pateint will improve 6 Minute Walk Test distance to equal or greater than 1000 feet with LRAD to demonstrate improved activity tolerance and endurance for community mobility and participation.    Baseline to be tested visit 2 (04/04/2019); 475 feet with SPC with SBA. (04/16/2020); 562 feet with SPC and CGA (05/07/2020); 454 feet with SPC and CGA and at least two stumbles (06/26/2020); 605 feet with SPC in left UE and one stumble (10/02/2020);    Time 12    Period Weeks    Status Partially Met                   Plan - 10/22/20 2315     Clinical Impression Statement Initiated Aquatic Therapy today and Pt tolerated Rx well.  Pt performed exercises well with instruction and cuing for correct form.  Pt required cuing for control with R LE movements in the water having more difficulty with exercises with R LE including moving against the resistance of water.  PT did provide supervision with pt ambulating with noodle in water.  Pt gave good effort with all exercises.  PT educated pt concerning the properties and benefits of water.  Pt responded well to Rx having no increased pain and was appropriately fatigued.  Pt was slow with ascending stairs to exit the pool after Rx.  She used bilat rails and a step to gait though pt performed without assistance except CGA on the last 2 steps.  PT walked with pt provided SBA/CGA after water therapy to ensure safety including pt not slipping.  Pt ambulated with cane and AFO when leaving the pool.  Pt should benefit from cont skilled PT services including aquatic therapy to improve function and address ongoing goals.  Comorbidities Relevant past medical history and comorbidities include anxiety, bilateral chronic knee pain, arthritis, chronic back pain, GERD, memory changes, sinus bradycardia, urge incontinence, R foot bunionectomy (2013), former smoker.    PT Treatment/Interventions ADLs/Self Care Home Management;Aquatic  Therapy;Cryotherapy;Moist Heat;Functional mobility training;Stair training;Gait training;DME Instruction;Therapeutic activities;Therapeutic exercise;Balance training;Orthotic Fit/Training;Neuromuscular re-education;Patient/family education;Manual techniques;Passive range of motion;Dry needling;Electrical Stimulation;Energy conservation;Taping;Splinting;Joint Manipulations    PT Next Visit Plan Assess response to aquatic Rx.  Cont to progress strengthening, balance, and mobility exericses with aquatic therapy per pt tolerance.    PT Home Exercise Plan [email or web address] Access Code: [email or web address]    Consulted and Agree with Plan of Care Patient             Patient will benefit from skilled therapeutic intervention in order to improve the following deficits and impairments:  Abnormal gait, Decreased knowledge of use of DME, Impaired sensation, Improper body mechanics, Pain, Decreased coordination, Decreased mobility, Impaired tone, Postural dysfunction, Decreased activity tolerance, Decreased endurance, Decreased range of motion, Decreased strength, Impaired perceived functional ability, Difficulty walking, Decreased balance  Visit Diagnosis: Difficulty in walking, not elsewhere classified  Muscle weakness (generalized)  Bilateral low back pain without sciatica, unspecified chronicity  Unsteadiness on feet  Right knee pain, unspecified chronicity  Left knee pain, unspecified chronicity     Problem List Patient Active Problem List   Diagnosis Date Noted   Chronic back pain 08/07/2019   Depression 08/07/2019   Near syncope 08/06/2019   Sinus bradycardia 08/06/2019   Weakness 11/03/2012    Selinda Michaels III PT, DPT 10/22/20 11:38 PM   [facility] [address] [place], [place], [address] Phone: [phone]   Fax:  [phone]  Name: [name] MRN: [id number] Date of Birth: 23-Feb-1946

## 2020-10-25 ENCOUNTER — Ambulatory Visit (HOSPITAL_BASED_OUTPATIENT_CLINIC_OR_DEPARTMENT_OTHER): Payer: Medicare Other | Admitting: Physical Therapy

## 2020-10-25 ENCOUNTER — Other Ambulatory Visit: Payer: Self-pay

## 2020-10-25 ENCOUNTER — Encounter (HOSPITAL_BASED_OUTPATIENT_CLINIC_OR_DEPARTMENT_OTHER): Payer: Self-pay | Admitting: Physical Therapy

## 2020-10-25 DIAGNOSIS — R2681 Unsteadiness on feet: Secondary | ICD-10-CM | POA: Diagnosis not present

## 2020-10-25 DIAGNOSIS — M6281 Muscle weakness (generalized): Secondary | ICD-10-CM

## 2020-10-25 DIAGNOSIS — M25562 Pain in left knee: Secondary | ICD-10-CM | POA: Diagnosis not present

## 2020-10-25 DIAGNOSIS — M545 Low back pain, unspecified: Secondary | ICD-10-CM | POA: Diagnosis not present

## 2020-10-25 DIAGNOSIS — R262 Difficulty in walking, not elsewhere classified: Secondary | ICD-10-CM

## 2020-10-25 DIAGNOSIS — M25561 Pain in right knee: Secondary | ICD-10-CM

## 2020-10-25 NOTE — Therapy (Signed)
[facility] [address] [place], [place], [address] Phone: [phone]   Fax:  [phone]  Physical Therapy Treatment  Patient Details  Name: [name] MRN: [id number] Date of Birth: October 17, 1945 Referring Provider (PT): Erline Hau, [place]   Encounter Date: 10/25/2020   PT End of Session - 10/25/20 1412     Visit Number 28    Number of Visits 48    Date for PT Re-Evaluation 12/25/20    Authorization Type Medicare reporting period from 10/02/2020    Progress Note Due on Visit 30    PT Start Time 1252    PT Stop Time 1337    PT Time Calculation (min) 45 min    Equipment Utilized During Treatment Other (comment)   noodle, pt used water shoes   Activity Tolerance Patient tolerated treatment well;No increased pain    Behavior During Therapy WFL for tasks assessed/performed             Past Medical History:  Diagnosis Date   Anxiety    Arthritis    Bilateral chronic knee pain    Chronic back pain    Depression    GERD (gastroesophageal reflux disease)    Insomnia    Memory changes    Sinus bradycardia    Urge incontinence     Past Surgical History:  Procedure Laterality Date   ABDOMINAL HYSTERECTOMY  1995   back sugery     lumbar   BIOPSY THYROID     benign goiter   BUNIONECTOMY  2013   rt foot   COLONOSCOPY     MUSCLE BIOPSY Left 10/03/2012   Procedure: LEFT QUADRICEP MUSCLE BIOPSY;  Surgeon: Odis Hollingshead, MD;  Location: [facility];  Service: General;  Laterality: Left;   NECK SURGERY  2010   cerv disc fused     There were no vitals filed for this visit.   Subjective Assessment - 10/25/20 1249     Subjective Pt denies any falls recently and states she can't remember the last time she fell.  Pt states she felt fine after prior Rx, just a little tired.    Pertinent History Patient is a 75 y.o. female who presents to outpatient physical therapy with a referral for medical  diagnosis thoracic spine pain, lumbar spine fusion, generalized weakness. This patient's chief complaints consist of weakness and difficulty moving R leg leading to the following functional deficits: increased difficulty with daily tasks and mobility including walking, stairs, getting in and out of the car, grocery shopping, general mobility, fear of falling.. She ambulates with a single point cane and carbon fiber AFO on the R ankle.  Relevant past medical history and comorbidities include anxiety, bilateral chronic knee pain, arthritis, chronic back pain, GERD, memory changes, sinus bradycardia, urge incontinence, R foot bunionectomy (2013), former smoker.   Patient denies hx of cancer, stroke, seizures, lung problem, major cardiac events, diabetes, unexplained weight loss, changes in bowel or bladder problems, new onset stumbling or dropping things.    Currently in Pain? Yes    Pain Score --   4-5/10   Pain Location Knee   bilat             -Pt seen for aquatic therapy today.  Treatment took place in water 3.5-4.5 ft in depth at the [facility]. Temp of water was 92.  Pt entered/exited the pool via stairs with step to gait with bilat rails -Reviewed response to prior  Rx, current function, HEP compliance, and pain level. -Introduction to water.  PT had Pt stand at different depths of water in order to feel the buoyancy and how it relates to her sx's.     -Pt ambulated 3 laps without noodle and sidestepped 2 laps with noodle with supervision.   -Pt performed:     -marching, Hip flexion, and Hip abduction 2 x 10 reps each with bilat UE assist on pool edge.     -LE noodle pushdowns with back to wall x 15 reps bilat.      -Balance Activities:  Tandem stance 2x20 sec each with UE assist on edge of pool.  alternate toe tapping on pool step.     -Sit to stand from pool bench 2x10 reps     -seated bicycles 2x20 reps   Pt requires buoyancy for support and to offload joints with  strengthening exercises. Viscosity of the water is needed for resistance of strengthening; water current perturbations provides challenge to standing balance unsupported, requiring increased core activation      PT Education - 10/25/20 1412     Education Details Educated pt concerning POC including the process of aquatic and land based PT. Educated pt on the properties and benefits of aquatic therapy. Instructed pt in correct form with exercises.    Person(s) Educated Patient    Methods Explanation;Demonstration;Verbal cues    Comprehension Returned demonstration;Verbalized understanding              PT Short Term Goals - 10/02/20 1409       PT SHORT TERM GOAL #1   Title Be independent with initial home exercise program for self-management of symptoms.    Baseline To be initiated at visit 2 as appropriate (04/03/2020); provided but not participating (04/24/2020); participating in ambulation and sit <> stand (05/07/2020; 06/26/2020); participating in ambulation practice at home (10/02/2020);    Time 2    Period Weeks    Status Partially Met    Target Date 04/17/20               PT Long Term Goals - 10/02/20 1410       PT LONG TERM GOAL #1   Title Be independent with a long-term home exercise program for self-management of symptoms.    Baseline to be updated at visit 2 as appropriate (04/03/2020); partially participating (05/07/2020; 06/26/2020; 10/02/2020);    Time 12    Period Weeks    Status Partially Met   TARGET DATE FOR ALL LONG TERM GOALS: 06/26/2020. TARGET DATE FOR UNMET GOALS UPDATED TO 09/18/2020. UPDATED TO 12/25/2020     PT LONG TERM GOAL #2   Title Demonstrate improved FOTO score to equal or greater than 56 by visit #20 demonstrate improvement in overall condition and self-reported functional ability.    Baseline 48 (04/03/2020); 45 (05/07/2020); 45 (06/26/2020); 46 (10/02/20);    Time 12    Period Weeks    Status On-going      PT LONG TERM GOAL #3   Title Patient will  improve her 10 MWT speed with her SPC to at least 1.2 m/s to promote better community ambulation and safe crossing of streets.    Baseline to be measured visit 2 (04/03/2020); 0.54 meters/second with SPC and R AFO (04/16/2020): 0.6 meters/second with SPC (05/07/2020);  0.55 meters/second with SPC (06/26/2020); 0.49 m/sec with SPC and AFO (10/02/2020);    Time 12    Period Weeks    Status On-going  PT LONG TERM GOAL #4   Title Patient will improve ABC score by 13 percentage points to demonstrate improved self-reported balance.    Baseline 23.8% (04/03/2020); 32.5% (05/07/2020); 26.3% (06/26/2020); 23.8% (10/02/2020);    Time 12    Period Weeks    Status On-going      PT LONG TERM GOAL #5   Title Patient will complete 5 Times Sit to Stand test from chair height without UE support in equal or less than 14 seconds to improve B LE power and strength for transfers and improved mobility, and demonstrate decreased fall risk (threshold between 12 and 15 for increased fall risk).    Baseline 28 seconds with great difficulty from 18.5 inch plinth with B UE support on mat and knees. Very unstable (04/03/2020); 18 seconds with moderate difficulty from 18.5 inch plinth with B UE support on mat. Mildly unstable. (05/07/2020); 18 seconds from same surface with similar limitations (06/26/2020); 7 seconds with moderate difficulty from 18.5 inch plinth with B UE support on mat. Mildly unstable and did not stand up fully last rep. Limited by left glute pain (10/02/2020);    Time 12    Period Weeks    Status Partially Met      PT LONG TERM GOAL #6   Title Pateint will improve 6 Minute Walk Test distance to equal or greater than 1000 feet with LRAD to demonstrate improved activity tolerance and endurance for community mobility and participation.    Baseline to be tested visit 2 (04/04/2019); 475 feet with SPC with SBA. (04/16/2020); 562 feet with SPC and CGA (05/07/2020); 454 feet with SPC and CGA and at least two stumbles  (06/26/2020); 605 feet with SPC in left UE and one stumble (10/02/2020);    Time 12    Period Weeks    Status Partially Met                   Plan - 10/25/20 1413     Clinical Impression Statement Pt performed aqauatic exercises well with instruction and cuing for correct form. Pt required cuing for control with R LE movements in the water having more difficulty with exercises with R LE including moving against the resistance of water.  Pt had difficulty with controlling noodle with LE pushdowns on bilat LEs; she used her hands for assistance and limited ROM.  Pt ambulated without noodle today and did well.  Pt gave good effort with all exercises.  Pt responded well to Rx having no increased pain and was appropriately fatigued.  She states she feels heavy after Rx.  Pt used bilat rails and a step to gait with stairs though pt performed without physical assistance.  PT walked with pt providing SBA after water therapy to ensure safety including pt not slipping. Pt ambulated with cane and AFO when leaving the pool. Pt should benefit from cont skilled PT services including aquatic therapy to improve function and address ongoing goals.    Comorbidities Relevant past medical history and comorbidities include anxiety, bilateral chronic knee pain, arthritis, chronic back pain, GERD, memory changes, sinus bradycardia, urge incontinence, R foot bunionectomy (2013), former smoker.    PT Treatment/Interventions ADLs/Self Care Home Management;Aquatic Therapy;Cryotherapy;Moist Heat;Functional mobility training;Stair training;Gait training;DME Instruction;Therapeutic activities;Therapeutic exercise;Balance training;Orthotic Fit/Training;Neuromuscular re-education;Patient/family education;Manual techniques;Passive range of motion;Dry needling;Electrical Stimulation;Energy conservation;Taping;Splinting;Joint Manipulations    PT Next Visit Plan Assess response to aquatic Rx.  Cont to progress strengthening, balance,  and mobility exericses with aquatic therapy per pt tolerance.  Aquatic next  Rx and then PN on land.    PT Home Exercise Plan [email or web address] Access Code: [email or web address]    EPPIRJJOA and Agree with Plan of Care Patient             Patient will benefit from skilled therapeutic intervention in order to improve the following deficits and impairments:     Visit Diagnosis: Difficulty in walking, not elsewhere classified  Muscle weakness (generalized)  Bilateral low back pain without sciatica, unspecified chronicity  Unsteadiness on feet  Right knee pain, unspecified chronicity  Left knee pain, unspecified chronicity     Problem List Patient Active Problem List   Diagnosis Date Noted   Chronic back pain 08/07/2019   Depression 08/07/2019   Near syncope 08/06/2019   Sinus bradycardia 08/06/2019   Weakness 11/03/2012   Selinda Michaels III PT, DPT 10/25/20 2:31 PM   [facility] [address] [place], [place], [address] Phone: [phone]   Fax:  [phone]  Name: [name] MRN: [id number] Date of Birth: 1945-04-25

## 2020-10-28 DIAGNOSIS — Z20822 Contact with and (suspected) exposure to covid-19: Secondary | ICD-10-CM | POA: Diagnosis not present

## 2020-10-29 ENCOUNTER — Other Ambulatory Visit: Payer: Self-pay

## 2020-10-29 ENCOUNTER — Ambulatory Visit (HOSPITAL_BASED_OUTPATIENT_CLINIC_OR_DEPARTMENT_OTHER): Payer: Medicare Other | Admitting: Physical Therapy

## 2020-10-29 ENCOUNTER — Encounter (HOSPITAL_BASED_OUTPATIENT_CLINIC_OR_DEPARTMENT_OTHER): Payer: Self-pay | Admitting: Physical Therapy

## 2020-10-29 DIAGNOSIS — R262 Difficulty in walking, not elsewhere classified: Secondary | ICD-10-CM

## 2020-10-29 DIAGNOSIS — M6281 Muscle weakness (generalized): Secondary | ICD-10-CM

## 2020-10-29 DIAGNOSIS — M545 Low back pain, unspecified: Secondary | ICD-10-CM

## 2020-10-29 DIAGNOSIS — M25562 Pain in left knee: Secondary | ICD-10-CM | POA: Diagnosis not present

## 2020-10-29 DIAGNOSIS — M25561 Pain in right knee: Secondary | ICD-10-CM | POA: Diagnosis not present

## 2020-10-29 DIAGNOSIS — R2681 Unsteadiness on feet: Secondary | ICD-10-CM | POA: Diagnosis not present

## 2020-10-29 NOTE — Therapy (Signed)
[facility] [address] [place], [place], [address] Phone: [phone]   Fax:  [phone]  Physical Therapy Treatment  Patient Details  Name: [name] MRN: [id number] Date of Birth: 07/21/1945 Referring Provider (PT): Erline Hau, [place]   Encounter Date: 10/29/2020   PT End of Session - 10/29/20 2246     Visit Number 29    Number of Visits 48    Date for PT Re-Evaluation 12/25/20    Authorization Type Medicare reporting period from 10/02/2020    Progress Note Due on Visit 30    PT Start Time 1210    PT Stop Time 1248    PT Time Calculation (min) 38 min    Equipment Utilized During Treatment Other (comment)   noodle, pt used water shoes   Activity Tolerance Patient tolerated treatment well;No increased pain    Behavior During Therapy WFL for tasks assessed/performed             Past Medical History:  Diagnosis Date   Anxiety    Arthritis    Bilateral chronic knee pain    Chronic back pain    Depression    GERD (gastroesophageal reflux disease)    Insomnia    Memory changes    Sinus bradycardia    Urge incontinence     Past Surgical History:  Procedure Laterality Date   ABDOMINAL HYSTERECTOMY  1995   back sugery     lumbar   BIOPSY THYROID     benign goiter   BUNIONECTOMY  2013   rt foot   COLONOSCOPY     MUSCLE BIOPSY Left 10/03/2012   Procedure: LEFT QUADRICEP MUSCLE BIOPSY;  Surgeon: Odis Hollingshead, MD;  Location: [facility];  Service: General;  Laterality: Left;   NECK SURGERY  2010   cerv disc fused     There were no vitals filed for this visit.   Subjective Assessment - 10/29/20 1214     Subjective Pt denies any falls recently and states she can't remember the last time she fell.  Pt states she felt good after prior Rx.  Pt reports increased knee pain today which may be due to walking around [facility].  Pt states it's a hard surface to walk on at [facility].  Pt  states she feels the same overall and reports no recent functional improvements.    Pertinent History Patient is a 75 y.o. female who presents to outpatient physical therapy with a referral for medical diagnosis thoracic spine pain, lumbar spine fusion, generalized weakness. This patient's chief complaints consist of weakness and difficulty moving R leg leading to the following functional deficits: increased difficulty with daily tasks and mobility including walking, stairs, getting in and out of the car, grocery shopping, general mobility, fear of falling.. She ambulates with a single point cane and carbon fiber AFO on the R ankle.  Relevant past medical history and comorbidities include anxiety, bilateral chronic knee pain, arthritis, chronic back pain, GERD, memory changes, sinus bradycardia, urge incontinence, R foot bunionectomy (2013), former smoker.   Patient denies hx of cancer, stroke, seizures, lung problem, major cardiac events, diabetes, unexplained weight loss, changes in bowel or bladder problems, new onset stumbling or dropping things.    Diagnostic tests Chest/Lumbar CT report from 01/14/2020: " IMPRESSION:  1. No CT evidence for acute thoracic, abdominal or pelvic injury.  2. No acute fracture involving the thoracic or lumbar spine.  3. Chronic findings as  detailed above."    Currently in Pain? Yes    Pain Score --   7-8/10   Pain Location Knee    Pain Orientation Right;Left             -Pt seen for aquatic therapy today.  Treatment took place in water 3.5-4 ft 8 in depth at the [facility] pool. Temp of water was 94.  Pt entered/exited the pool via stairs with step to gait with bilat rails -Reviewed response to prior Rx, current function, HEP compliance, and pain level.     -Pt ambulated 1 lap with noodle and 2 laps without noodle and sidestepped 2 laps with noodle with supervision.   -Pt performed:     -marching, Hip flexion, and Hip abduction 2 x 10 reps each with  bilat UE assist on pool edge.     -Balance Activities:  Tandem/staggered stance 2x20 sec each with UE assist on edge of pool.  alternate toe tapping on pool step.     -Sit to stand from pool bench 2x10 reps     -seated bicycles 2x20 reps   Pt requires buoyancy for support and to offload joints with strengthening exercises. Viscosity of the water is needed for resistance of strengthening; water current perturbations provides challenge to standing balance unsupported, requiring increased core activation        PT Education - 10/29/20 2242     Education Details Educated pt concrening POC.  Eduated pt on the properties and benefits of aquatic therapy.  Instructed pt in correct form with exercises.    Person(s) Educated Patient    Methods Explanation;Demonstration;Verbal cues    Comprehension Verbalized understanding;Returned demonstration              PT Short Term Goals - 10/02/20 1409       PT SHORT TERM GOAL #1   Title Be independent with initial home exercise program for self-management of symptoms.    Baseline To be initiated at visit 2 as appropriate (04/03/2020); provided but not participating (04/24/2020); participating in ambulation and sit <> stand (05/07/2020; 06/26/2020); participating in ambulation practice at home (10/02/2020);    Time 2    Period Weeks    Status Partially Met    Target Date 04/17/20               PT Long Term Goals - 10/02/20 1410       PT LONG TERM GOAL #1   Title Be independent with a long-term home exercise program for self-management of symptoms.    Baseline to be updated at visit 2 as appropriate (04/03/2020); partially participating (05/07/2020; 06/26/2020; 10/02/2020);    Time 12    Period Weeks    Status Partially Met   TARGET DATE FOR ALL LONG TERM GOALS: 06/26/2020. TARGET DATE FOR UNMET GOALS UPDATED TO 09/18/2020. UPDATED TO 12/25/2020     PT LONG TERM GOAL #2   Title Demonstrate improved FOTO score to equal or greater than 56 by visit #20  demonstrate improvement in overall condition and self-reported functional ability.    Baseline 48 (04/03/2020); 45 (05/07/2020); 45 (06/26/2020); 46 (10/02/20);    Time 12    Period Weeks    Status On-going      PT LONG TERM GOAL #3   Title Patient will improve her 10 MWT speed with her SPC to at least 1.2 m/s to promote better community ambulation and safe crossing of streets.    Baseline to be measured visit 2 (04/03/2020);  0.54 meters/second with SPC and R AFO (04/16/2020): 0.6 meters/second with SPC (05/07/2020);  0.55 meters/second with SPC (06/26/2020); 0.49 m/sec with SPC and AFO (10/02/2020);    Time 12    Period Weeks    Status On-going      PT LONG TERM GOAL #4   Title Patient will improve ABC score by 13 percentage points to demonstrate improved self-reported balance.    Baseline 23.8% (04/03/2020); 32.5% (05/07/2020); 26.3% (06/26/2020); 23.8% (10/02/2020);    Time 12    Period Weeks    Status On-going      PT LONG TERM GOAL #5   Title Patient will complete 5 Times Sit to Stand test from chair height without UE support in equal or less than 14 seconds to improve B LE power and strength for transfers and improved mobility, and demonstrate decreased fall risk (threshold between 12 and 15 for increased fall risk).    Baseline 28 seconds with great difficulty from 18.5 inch plinth with B UE support on mat and knees. Very unstable (04/03/2020); 18 seconds with moderate difficulty from 18.5 inch plinth with B UE support on mat. Mildly unstable. (05/07/2020); 18 seconds from same surface with similar limitations (06/26/2020); 7 seconds with moderate difficulty from 18.5 inch plinth with B UE support on mat. Mildly unstable and did not stand up fully last rep. Limited by left glute pain (10/02/2020);    Time 12    Period Weeks    Status Partially Met      PT LONG TERM GOAL #6   Title Pateint will improve 6 Minute Walk Test distance to equal or greater than 1000 feet with LRAD to demonstrate improved  activity tolerance and endurance for community mobility and participation.    Baseline to be tested visit 2 (04/04/2019); 475 feet with SPC with SBA. (04/16/2020); 562 feet with SPC and CGA (05/07/2020); 454 feet with SPC and CGA and at least two stumbles (06/26/2020); 605 feet with SPC in left UE and one stumble (10/02/2020);    Time 12    Period Weeks    Status Partially Met                   Plan - 10/29/20 2248     Clinical Impression Statement Pt arrived late to Rx today and Rx time was a little limited.  Pt performed aquatic exercises well with instruction and cuing for correct form.  She gave good effort with all exercises and states her knees feel better in the water.  Pt favored ambulating in the deeper water today.   Pt responded well to Rx having no increased pain and was appropriately fatigued.  Pt used bilat rails and a step to gait with stairs though pt performed without physical assistance. PT walked with pt providing SBA when getting out of the pool after water therapy to ensure safety including pt not slipping.  Pt should benefit from cont skilled PT services including aquatic therapy to improve function and address ongoing goals    Comorbidities Relevant past medical history and comorbidities include anxiety, bilateral chronic knee pain, arthritis, chronic back pain, GERD, memory changes, sinus bradycardia, urge incontinence, R foot bunionectomy (2013), former smoker.    PT Treatment/Interventions ADLs/Self Care Home Management;Aquatic Therapy;Cryotherapy;Moist Heat;Functional mobility training;Stair training;Gait training;DME Instruction;Therapeutic activities;Therapeutic exercise;Balance training;Orthotic Fit/Training;Neuromuscular re-education;Patient/family education;Manual techniques;Passive range of motion;Dry needling;Electrical Stimulation;Energy conservation;Taping;Splinting;Joint Manipulations    PT Next Visit Plan PN next visit on land.  Assess response to aquatic Rx.   Cont to  progress strengthening, balance, and mobility exericses with aquatic therapy per pt tolerance.    PT Home Exercise Plan [email or web address] Access Code: [email or web address]    Consulted and Agree with Plan of Care Patient             Patient will benefit from skilled therapeutic intervention in order to improve the following deficits and impairments:  Abnormal gait, Decreased knowledge of use of DME, Impaired sensation, Improper body mechanics, Pain, Decreased coordination, Decreased mobility, Impaired tone, Postural dysfunction, Decreased activity tolerance, Decreased endurance, Decreased range of motion, Decreased strength, Impaired perceived functional ability, Difficulty walking, Decreased balance  Visit Diagnosis: Difficulty in walking, not elsewhere classified  Muscle weakness (generalized)  Bilateral low back pain without sciatica, unspecified chronicity  Unsteadiness on feet  Right knee pain, unspecified chronicity  Left knee pain, unspecified chronicity     Problem List Patient Active Problem List   Diagnosis Date Noted   Chronic back pain 08/07/2019   Depression 08/07/2019   Near syncope 08/06/2019   Sinus bradycardia 08/06/2019   Weakness 11/03/2012    Selinda Michaels III PT, DPT 10/29/20 11:02 PM   [facility] [address] [place], [place], [address] Phone: [phone]   Fax:  [phone]  Name: [name] MRN: [id number] Date of Birth: 1945-07-17

## 2020-10-30 DIAGNOSIS — G3184 Mild cognitive impairment, so stated: Secondary | ICD-10-CM | POA: Diagnosis not present

## 2020-10-30 DIAGNOSIS — R531 Weakness: Secondary | ICD-10-CM | POA: Diagnosis not present

## 2020-10-30 DIAGNOSIS — R7309 Other abnormal glucose: Secondary | ICD-10-CM | POA: Diagnosis not present

## 2020-10-30 DIAGNOSIS — R634 Abnormal weight loss: Secondary | ICD-10-CM | POA: Diagnosis not present

## 2020-10-31 ENCOUNTER — Ambulatory Visit: Payer: Medicare Other | Attending: Physician Assistant | Admitting: Physical Therapy

## 2020-10-31 ENCOUNTER — Encounter: Payer: Self-pay | Admitting: Physical Therapy

## 2020-10-31 DIAGNOSIS — R29898 Other symptoms and signs involving the musculoskeletal system: Secondary | ICD-10-CM | POA: Insufficient documentation

## 2020-10-31 DIAGNOSIS — R2681 Unsteadiness on feet: Secondary | ICD-10-CM

## 2020-10-31 DIAGNOSIS — M25562 Pain in left knee: Secondary | ICD-10-CM | POA: Diagnosis not present

## 2020-10-31 DIAGNOSIS — M545 Low back pain, unspecified: Secondary | ICD-10-CM

## 2020-10-31 DIAGNOSIS — M6281 Muscle weakness (generalized): Secondary | ICD-10-CM | POA: Insufficient documentation

## 2020-10-31 DIAGNOSIS — R262 Difficulty in walking, not elsewhere classified: Secondary | ICD-10-CM | POA: Insufficient documentation

## 2020-10-31 DIAGNOSIS — M25561 Pain in right knee: Secondary | ICD-10-CM | POA: Insufficient documentation

## 2020-10-31 NOTE — Therapy (Signed)
[facility] [address] [place], [address] Phone: [phone]   Fax:  [phone]  Physical Therapy Treatment / Progress Note Dates of reporting: 10/02/2020 - 10/31/2020  Patient Details  Name: [name] MRN: [id number] Date of Birth: 1945-07-08 Referring Provider (PT): Erline Hau, [place]   Encounter Date: 10/31/2020   PT End of Session - 10/31/20 1620     Visit Number 30    Number of Visits 48    Date for PT Re-Evaluation 12/25/20    Authorization Type Medicare reporting period from 10/31/2020    Progress Note Due on Visit 40    PT Start Time 1606    PT Stop Time 1645    PT Time Calculation (min) 39 min    Equipment Utilized During Treatment Other (comment);Gait belt   SPC   Activity Tolerance Patient tolerated treatment well;No increased pain    Behavior During Therapy WFL for tasks assessed/performed             Past Medical History:  Diagnosis Date   Anxiety    Arthritis    Bilateral chronic knee pain    Chronic back pain    Depression    GERD (gastroesophageal reflux disease)    Insomnia    Memory changes    Sinus bradycardia    Urge incontinence     Past Surgical History:  Procedure Laterality Date   ABDOMINAL HYSTERECTOMY  1995   back sugery     lumbar   BIOPSY THYROID     benign goiter   BUNIONECTOMY  2013   rt foot   COLONOSCOPY     MUSCLE BIOPSY Left 10/03/2012   Procedure: LEFT QUADRICEP MUSCLE BIOPSY;  Surgeon: Odis Hollingshead, MD;  Location: [facility];  Service: General;  Laterality: Left;   NECK SURGERY  2010   cerv disc fused     There were no vitals filed for this visit.   Subjective Assessment - 10/31/20 1607     Subjective Patient reports no falls since her last PT session. She states she had a tooth removed this morning and her jaw is really hurting (10/10 pain), but she still came to PT because she is highly motivated to get back to  aquatic therapy. She reports no pain anywhere else. She arrivew with SPC but no AFO. Dentist cautioned her not to do anything strenuous today.    Pertinent History Patient is a 75 y.o. female who presents to outpatient physical therapy with a referral for medical diagnosis thoracic spine pain, lumbar spine fusion, generalized weakness. This patient's chief complaints consist of weakness and difficulty moving R leg leading to the following functional deficits: increased difficulty with daily tasks and mobility including walking, stairs, getting in and out of the car, grocery shopping, general mobility, fear of falling.. She ambulates with a single point cane and carbon fiber AFO on the R ankle.  Relevant past medical history and comorbidities include anxiety, bilateral chronic knee pain, arthritis, chronic back pain, GERD, memory changes, sinus bradycardia, urge incontinence, R foot bunionectomy (2013), former smoker.   Patient denies hx of cancer, stroke, seizures, lung problem, major cardiac events, diabetes, unexplained weight loss, changes in bowel or bladder problems, new onset stumbling or dropping things.    Diagnostic tests Chest/Lumbar CT report from 01/14/2020: " IMPRESSION:  1. No CT evidence for acute thoracic, abdominal or pelvic injury.  2. No acute fracture involving the thoracic or  lumbar spine.  3. Chronic findings as detailed above."    Currently in Pain? Yes    Pain Score 10-Worst pain ever    Pain Location --   jaw            OBJECTIVE FOTO = 59 (10/31/2020); ABC scale = 35% (10/31/2020): 6MWT: 680 feet with SPC in left UE, no AFO. CGA-minA to prevent falls from several stumbles when pt caught R toe on floor. - 10MWT: 0.64 m/s with SPC in right UE, CGA  for safety (no stumbles)   - Five Time Sit to Stand (5TSTS): 14.28 seconds with B UE support from 18.5 inch plinth with B UE support on mat. Mildly unstable and did not stand up fully 1st rep. Complains of fatigue but demonstrates  improved confidence.    TREATMENT:  use of SPC in left UE, no AFO   Therapeutic exercise: to centralize symptoms and improve ROM, strength, muscular endurance, and activity tolerance required for successful completion of functional activities.  - ambulation around clinic for distance in 6 minutes with SPC in LE UE and no AFO. CGA-minA to prevent falls from several stumbles when pt caught R toe on floor. - ambulation with SPC 2x30 feet for speed - sit <> stand from 18.5 inch plinth with BUE support and SBA 1x5 for speed.  - seated long arc quad, 3x20 each side, R 5# AW, L 20# AW.  - ambulation ~ 100 feet to vehicle with CGA  using SPC.   Pt required multimodal cuing for proper technique and to facilitate improved neuromuscular control, strength, range of motion, and functional ability resulting in improved performance and form. Required close supervision to minA  for safety during standing activities with occasional unsteadiness or stumble that patient was able to recover from with step strategy or by grabbing a nearby wall/object.    PT Education - 10/31/20 1958     Education Details exercise purpose/form. POC    Person(s) Educated Patient    Methods Explanation;Demonstration;Verbal cues    Comprehension Verbalized understanding;Returned demonstration;Verbal cues required;Need further instruction              PT Short Term Goals - 10/02/20 1409       PT SHORT TERM GOAL #1   Title Be independent with initial home exercise program for self-management of symptoms.    Baseline To be initiated at visit 2 as appropriate (04/03/2020); provided but not participating (04/24/2020); participating in ambulation and sit <> stand (05/07/2020; 06/26/2020); participating in ambulation practice at home (10/02/2020);    Time 2    Period Weeks    Status Partially Met    Target Date 04/17/20               PT Long Term Goals - 10/31/20 1628       PT LONG TERM GOAL #1   Title Be independent with  a long-term home exercise program for self-management of symptoms.    Baseline to be updated at visit 2 as appropriate (04/03/2020); partially participating (05/07/2020; 06/26/2020; 10/02/2020); working on walking at home and climbing steps at home sit <> stands participating regularly (10/31/2020);    Time 12    Period Weeks    Status Partially Met   TARGET DATE FOR ALL LONG TERM GOALS: 06/26/2020. TARGET DATE FOR UNMET GOALS UPDATED TO 09/18/2020. UPDATED TO 12/25/2020     PT LONG TERM GOAL #2   Title Demonstrate improved FOTO score to equal or greater than 56 by visit #  20 demonstrate improvement in overall condition and self-reported functional ability.    Baseline 48 (04/03/2020); 45 (05/07/2020); 45 (06/26/2020); 46 (10/02/20); 59 (10/31/2020);    Time 12    Period Weeks    Status Achieved      PT LONG TERM GOAL #3   Title Patient will improve her 10 MWT speed with her SPC to at least 1.2 m/s to promote better community ambulation and safe crossing of streets.    Baseline to be measured visit 2 (04/03/2020); 0.54 meters/second with SPC and R AFO (04/16/2020): 0.6 meters/second with SPC (05/07/2020);  0.55 meters/second with SPC (06/26/2020); 0.49 m/sec with SPC and AFO (10/02/2020); 0.64 m/s with SPC in right UE, CGA  for safety (10/31/2020);    Time 12    Period Weeks    Status Partially Met      PT LONG TERM GOAL #4   Title Patient will improve ABC score by 13 percentage points to demonstrate improved self-reported balance.    Baseline 23.8% (04/03/2020); 32.5% (05/07/2020); 26.3% (06/26/2020); 23.8% (10/02/2020); 35% (10/31/2020);    Time 12    Period Weeks    Status Partially Met      PT LONG TERM GOAL #5   Title Patient will complete 5 Times Sit to Stand test from chair height without UE support in equal or less than 14 seconds to improve B LE power and strength for transfers and improved mobility, and demonstrate decreased fall risk (threshold between 12 and 15 for increased fall risk).    Baseline 28  seconds with great difficulty from 18.5 inch plinth with B UE support on mat and knees. Very unstable (04/03/2020); 18 seconds with moderate difficulty from 18.5 inch plinth with B UE support on mat. Mildly unstable. (05/07/2020); 18 seconds from same surface with similar limitations (06/26/2020); 27 seconds with moderate difficulty from 18.5 inch plinth with B UE support on mat. Mildly unstable and did not stand up fully last rep. Limited by left glute pain (10/02/2020); 14.28 seconds with B UE support from 18.5 inch plinth with B UE support on mat (10/31/2020);    Time 12    Period Weeks    Status Partially Met      PT LONG TERM GOAL #6   Title Pateint will improve 6 Minute Walk Test distance to equal or greater than 1000 feet with LRAD to demonstrate improved activity tolerance and endurance for community mobility and participation.    Baseline to be tested visit 2 (04/04/2019); 475 feet with SPC with SBA. (04/16/2020); 562 feet with SPC and CGA (05/07/2020); 454 feet with SPC and CGA and at least two stumbles (06/26/2020); 605 feet with SPC in left UE and one stumble (10/02/2020); 680 feet with SPC in left UE, no AFO. CGA-minA to prevent falls from several stumbles when pt caught R toe on floor.    Time 12    Period Weeks    Status Partially Met                   Plan - 10/31/20 2008     Clinical Impression Statement Patient has attended 30 physical therapy sessions this episode of care and demonstrates significant progress towards goals at this time. She has improved her FOTO score from 48 to 59 (goal met), 10MWT speed form 0.54 m/s to 0.64 m/s, ABC score from 23.8% to 35%, 5TSTS from 28 seconds to 14.28 seconds, and 6MWT from 475 feet to 680 feet since starting PT. Patient continues to require  use of SPC and R AFO to help decrease falls and stumbles. Does demonstrate multiple stumbles during today's session and is not wearing her R AFO that increases chance of catching her toe. Patient continues  to be limited by chronic R hip weakness, B knee pain R > L, and R knee valgus deformity that interferes with normal gait, functional mobility, balance, and ability to complete her usual activities such as  bed mobility, transfers, household and community ambulation, stairs,  grocery shopping and daily chores. Her deficits also increase risk of fall and injury. Patient has been informed by MD that she will need to participate in PT for the rest of her life to maintain/maximize functional mobility and independence. Continued PT is medically necessary to prevent decline in function and need for higher level of care. Patient has been participating in aquatic therapy recently with good results and would benefit from continued participation. Plan to continue PT with inclusion of aquatic therapy as appropriate. Patient would benefit from continued management of limiting condition by skilled physical therapist to address remaining impairments and functional limitations to work towards stated goals and return to PLOF or maximal functional independence.    Personal Factors and Comorbidities Age;Comorbidity 3+;Education;Past/Current Experience;Fitness;Time since onset of injury/illness/exacerbation;Social Background    Comorbidities Relevant past medical history and comorbidities include anxiety, bilateral chronic knee pain, arthritis, chronic back pain, GERD, memory changes, sinus bradycardia, urge incontinence, R foot bunionectomy (2013), former smoker.    Examination-Activity Limitations Bed Mobility;Lift;Stairs;Squat;Bend;Locomotion Level;Stand;Caring for Others;Carry;Transfers;Dressing    Examination-Participation Restrictions Laundry;Church;Cleaning;Shop;Community Activity;Meal Prep;Driving;Yard Work    [employer] Evolving/Moderate complexity    Rehab Potential Fair    PT Frequency 2x / week    PT Duration 12 weeks    PT Treatment/Interventions ADLs/Self Care Home Management;Aquatic  Therapy;Cryotherapy;Moist Heat;Functional mobility training;Stair training;Gait training;DME Instruction;Therapeutic activities;Therapeutic exercise;Balance training;Orthotic Fit/Training;Neuromuscular re-education;Patient/family education;Manual techniques;Passive range of motion;Dry needling;Electrical Stimulation;Energy conservation;Joint Manipulations;Spinal Manipulations;Taping;Splinting    PT Next Visit Plan strengthening/balance exercises, aquatic therapy    PT Home Exercise Plan [email or web address] Access [email or web address]: [email or web address]    Consulted and Agree with Plan of Care Patient             Patient will benefit from skilled therapeutic intervention in order to improve the following deficits and impairments:  Abnormal gait, Decreased knowledge of use of DME, Impaired sensation, Improper body mechanics, Pain, Decreased coordination, Decreased mobility, Impaired tone, Postural dysfunction, Decreased activity tolerance, Decreased endurance, Decreased range of motion, Decreased strength, Impaired perceived functional ability, Difficulty walking, Decreased balance  Visit Diagnosis: Difficulty in walking, not elsewhere classified  Muscle weakness (generalized)  Other symptoms and signs involving the musculoskeletal system  Bilateral low back pain without sciatica, unspecified chronicity  Unsteadiness on feet  Right knee pain, unspecified chronicity  Left knee pain, unspecified chronicity     Problem List Patient Active Problem List   Diagnosis Date Noted   Chronic back pain 08/07/2019   Depression 08/07/2019   Near syncope 08/06/2019   Sinus bradycardia 08/06/2019   Weakness 11/03/2012    Everlean Alstrom. Graylon Good, PT, DPT 10/31/20, 8:09 PM  [facility] [address] [place], [address] Phone: [phone]   Fax:  [phone]  Name: [name] MRN: [id number] Date of Birth: Jul 02, 1945

## 2020-11-04 ENCOUNTER — Ambulatory Visit (HOSPITAL_BASED_OUTPATIENT_CLINIC_OR_DEPARTMENT_OTHER): Payer: Medicare Other | Admitting: Physical Therapy

## 2020-11-05 ENCOUNTER — Ambulatory Visit (HOSPITAL_BASED_OUTPATIENT_CLINIC_OR_DEPARTMENT_OTHER): Payer: Medicare Other | Admitting: Physical Therapy

## 2020-11-06 ENCOUNTER — Ambulatory Visit (HOSPITAL_BASED_OUTPATIENT_CLINIC_OR_DEPARTMENT_OTHER): Payer: Medicare Other | Admitting: Physical Therapy

## 2020-11-11 ENCOUNTER — Encounter: Payer: Medicare Other | Admitting: Physical Therapy

## 2020-11-11 ENCOUNTER — Ambulatory Visit (HOSPITAL_BASED_OUTPATIENT_CLINIC_OR_DEPARTMENT_OTHER): Payer: Medicare Other | Admitting: Physical Therapy

## 2020-11-12 ENCOUNTER — Ambulatory Visit (HOSPITAL_BASED_OUTPATIENT_CLINIC_OR_DEPARTMENT_OTHER): Payer: Medicare Other | Admitting: Physical Therapy

## 2020-11-14 ENCOUNTER — Ambulatory Visit (HOSPITAL_BASED_OUTPATIENT_CLINIC_OR_DEPARTMENT_OTHER): Payer: Medicare Other | Admitting: Physical Therapy

## 2020-11-14 ENCOUNTER — Encounter (HOSPITAL_BASED_OUTPATIENT_CLINIC_OR_DEPARTMENT_OTHER): Payer: Self-pay | Admitting: Physical Therapy

## 2020-11-14 ENCOUNTER — Other Ambulatory Visit: Payer: Self-pay

## 2020-11-14 DIAGNOSIS — M79604 Pain in right leg: Secondary | ICD-10-CM

## 2020-11-14 DIAGNOSIS — M6281 Muscle weakness (generalized): Secondary | ICD-10-CM

## 2020-11-14 DIAGNOSIS — H02122 Mechanical ectropion of right lower eyelid: Secondary | ICD-10-CM | POA: Diagnosis not present

## 2020-11-14 DIAGNOSIS — R262 Difficulty in walking, not elsewhere classified: Secondary | ICD-10-CM

## 2020-11-14 DIAGNOSIS — M25561 Pain in right knee: Secondary | ICD-10-CM | POA: Diagnosis not present

## 2020-11-14 DIAGNOSIS — R2681 Unsteadiness on feet: Secondary | ICD-10-CM

## 2020-11-14 DIAGNOSIS — H11821 Conjunctivochalasis, right eye: Secondary | ICD-10-CM | POA: Diagnosis not present

## 2020-11-14 DIAGNOSIS — M25562 Pain in left knee: Secondary | ICD-10-CM | POA: Diagnosis not present

## 2020-11-14 DIAGNOSIS — R29898 Other symptoms and signs involving the musculoskeletal system: Secondary | ICD-10-CM

## 2020-11-14 DIAGNOSIS — M79605 Pain in left leg: Secondary | ICD-10-CM

## 2020-11-14 DIAGNOSIS — M545 Low back pain, unspecified: Secondary | ICD-10-CM

## 2020-11-14 DIAGNOSIS — M2560 Stiffness of unspecified joint, not elsewhere classified: Secondary | ICD-10-CM

## 2020-11-14 DIAGNOSIS — M5386 Other specified dorsopathies, lumbar region: Secondary | ICD-10-CM

## 2020-11-14 NOTE — Therapy (Signed)
[facility] [address] [place], [place], [address] Phone: [phone]   Fax:  [phone]  Physical Therapy Treatment  Patient Details  Name: [name] MRN: [id number] Date of Birth: Aug 03, 1945 Referring Provider (PT): Erline Hau, [place]   Encounter Date: 11/14/2020   PT End of Session - 11/14/20 1145     Visit Number 31    Number of Visits 48    Date for PT Re-Evaluation 12/25/20    Authorization Type Medicare reporting period from 10/31/2020    PT Start Time 1140    PT Stop Time 1200    PT Time Calculation (min) 20 min             Past Medical History:  Diagnosis Date   Anxiety    Arthritis    Bilateral chronic knee pain    Chronic back pain    Depression    GERD (gastroesophageal reflux disease)    Insomnia    Memory changes    Sinus bradycardia    Urge incontinence     Past Surgical History:  Procedure Laterality Date   ABDOMINAL HYSTERECTOMY  1995   back sugery     lumbar   BIOPSY THYROID     benign goiter   BUNIONECTOMY  2013   rt foot   COLONOSCOPY     MUSCLE BIOPSY Left 10/03/2012   Procedure: LEFT QUADRICEP MUSCLE BIOPSY;  Surgeon: Odis Hollingshead, MD;  Location: [facility];  Service: General;  Laterality: Left;   NECK SURGERY  2010   cerv disc fused     There were no vitals filed for this visit.   Subjective Assessment - 11/14/20 1259     Subjective Late arrival due to accident on [address].  Cardiac monitor off. Knees are about the same"    Currently in Pain? Yes    Pain Score 7     Pain Location Knee    Pain Orientation Right;Left    Pain Descriptors / Indicators Aching    Pain Onset More than a month ago    Pain Frequency Constant                                         PT Short Term Goals - 10/02/20 1409       PT SHORT TERM GOAL #1   Title Be independent with initial home exercise program for self-management of  symptoms.    Baseline To be initiated at visit 2 as appropriate (04/03/2020); provided but not participating (04/24/2020); participating in ambulation and sit <> stand (05/07/2020; 06/26/2020); participating in ambulation practice at home (10/02/2020);    Time 2    Period Weeks    Status Partially Met    Target Date 04/17/20               PT Long Term Goals - 10/31/20 1628       PT LONG TERM GOAL #1   Title Be independent with a long-term home exercise program for self-management of symptoms.    Baseline to be updated at visit 2 as appropriate (04/03/2020); partially participating (05/07/2020; 06/26/2020; 10/02/2020); working on walking at home and climbing steps at home sit <> stands participating regularly (10/31/2020);    Time 12    Period Weeks    Status Partially Met   TARGET DATE FOR ALL LONG TERM GOALS: 06/26/2020.  TARGET DATE FOR UNMET GOALS UPDATED TO 09/18/2020. UPDATED TO 12/25/2020     PT LONG TERM GOAL #2   Title Demonstrate improved FOTO score to equal or greater than 56 by visit #20 demonstrate improvement in overall condition and self-reported functional ability.    Baseline 48 (04/03/2020); 45 (05/07/2020); 45 (06/26/2020); 46 (10/02/20); 59 (10/31/2020);    Time 12    Period Weeks    Status Achieved      PT LONG TERM GOAL #3   Title Patient will improve her 10 MWT speed with her SPC to at least 1.2 m/s to promote better community ambulation and safe crossing of streets.    Baseline to be measured visit 2 (04/03/2020); 0.54 meters/second with SPC and R AFO (04/16/2020): 0.6 meters/second with SPC (05/07/2020);  0.55 meters/second with SPC (06/26/2020); 0.49 m/sec with SPC and AFO (10/02/2020); 0.64 m/s with SPC in right UE, CGA  for safety (10/31/2020);    Time 12    Period Weeks    Status Partially Met      PT LONG TERM GOAL #4   Title Patient will improve ABC score by 13 percentage points to demonstrate improved self-reported balance.    Baseline 23.8% (04/03/2020); 32.5% (05/07/2020);  26.3% (06/26/2020); 23.8% (10/02/2020); 35% (10/31/2020);    Time 12    Period Weeks    Status Partially Met      PT LONG TERM GOAL #5   Title Patient will complete 5 Times Sit to Stand test from chair height without UE support in equal or less than 14 seconds to improve B LE power and strength for transfers and improved mobility, and demonstrate decreased fall risk (threshold between 12 and 15 for increased fall risk).    Baseline 28 seconds with great difficulty from 18.5 inch plinth with B UE support on mat and knees. Very unstable (04/03/2020); 18 seconds with moderate difficulty from 18.5 inch plinth with B UE support on mat. Mildly unstable. (05/07/2020); 18 seconds from same surface with similar limitations (06/26/2020); 27 seconds with moderate difficulty from 18.5 inch plinth with B UE support on mat. Mildly unstable and did not stand up fully last rep. Limited by left glute pain (10/02/2020); 14.28 seconds with B UE support from 18.5 inch plinth with B UE support on mat (10/31/2020);    Time 12    Period Weeks    Status Partially Met      PT LONG TERM GOAL #6   Title Pateint will improve 6 Minute Walk Test distance to equal or greater than 1000 feet with LRAD to demonstrate improved activity tolerance and endurance for community mobility and participation.    Baseline to be tested visit 2 (04/04/2019); 475 feet with SPC with SBA. (04/16/2020); 562 feet with SPC and CGA (05/07/2020); 454 feet with SPC and CGA and at least two stumbles (06/26/2020); 605 feet with SPC in left UE and one stumble (10/02/2020); 680 feet with SPC in left UE, no AFO. CGA-minA to prevent falls from several stumbles when pt caught R toe on floor.    Time 12    Period Weeks    Status Partially Met            -Pt seen for aquatic therapy today.  Treatment took place in water 3.5-4 ft 8 in depth at the [facility] pool. Temp of water was 94.  Pt entered/exited the pool via stairs with step to gait with bilat  rails -Reviewed response to prior Rx, current function, HEP compliance,  and pain level.     -Pt ambulated 1 lap with noodle and 2 laps without noodle and sidestepped 2 laps with noodle with supervision.   -Pt performed:     -marching, Hip flexion, and Hip abduction 2 x 10 reps each with bilat UE assist on pool edge then progressed to holding to yellow noodle. Cueing for tightened abdominal and glute while completing     -Balance Activities: balancing sitting on yellow noodle supported initially by pool edge then triangular hand buoys. After several tries pt gains position and bicylces   Pt requires buoyancy for support and to offload joints with strengthening exercises. Viscosity of the water is needed for resistance of strengthening; water current perturbations provides challenge to standing balance unsupported, requiring increased core activation       Plan - 11/14/20 1301     Clinical Impression Statement Pt with only 20 minutes left of appointment on arrival.  She is directed through LE ex and core balance challenges sitting on noodle to bicycle.  She is able to gain position and stay after session completing for 15 minutes for added le and core strengtheing as well as balance control/decreased fall risk.    PT Treatment/Interventions ADLs/Self Care Home Management;Aquatic Therapy;Cryotherapy;Moist Heat;Functional mobility training;Stair training;Gait training;DME Instruction;Therapeutic activities;Therapeutic exercise;Balance training;Orthotic Fit/Training;Neuromuscular re-education;Patient/family education;Manual techniques;Passive range of motion;Dry needling;Electrical Stimulation;Energy conservation;Joint Manipulations;Spinal Manipulations;Taping;Splinting             Patient will benefit from skilled therapeutic intervention in order to improve the following deficits and impairments:  Abnormal gait, Decreased knowledge of use of DME, Impaired sensation, Improper body mechanics,  Pain, Decreased coordination, Decreased mobility, Impaired tone, Postural dysfunction, Decreased activity tolerance, Decreased endurance, Decreased range of motion, Decreased strength, Impaired perceived functional ability, Difficulty walking, Decreased balance  Visit Diagnosis: Difficulty in walking, not elsewhere classified  Muscle weakness (generalized)  Left knee pain, unspecified chronicity  Right knee pain, unspecified chronicity  Joint stiffness of spine  Pain in right leg  Unsteadiness on feet  Bilateral low back pain without sciatica, unspecified chronicity  Pain in left leg  Other symptoms and signs involving the musculoskeletal system  Decreased ROM of lumbar spine     Problem List Patient Active Problem List   Diagnosis Date Noted   Chronic back pain 08/07/2019   Depression 08/07/2019   Near syncope 08/06/2019   Sinus bradycardia 08/06/2019   Weakness 11/03/2012    Vedia Pereyra MPT 11/14/2020, 1:13 PM  [place], [place], [address] Phone: [phone]   Fax:  [phone]  Name: [name] MRN: [id number] Date of Birth: March 30, 1945

## 2020-11-18 ENCOUNTER — Ambulatory Visit (HOSPITAL_BASED_OUTPATIENT_CLINIC_OR_DEPARTMENT_OTHER): Payer: Medicare Other | Admitting: Physical Therapy

## 2020-11-18 ENCOUNTER — Other Ambulatory Visit: Payer: Self-pay

## 2020-11-18 ENCOUNTER — Encounter (HOSPITAL_BASED_OUTPATIENT_CLINIC_OR_DEPARTMENT_OTHER): Payer: Self-pay | Admitting: Physical Therapy

## 2020-11-18 DIAGNOSIS — M79604 Pain in right leg: Secondary | ICD-10-CM

## 2020-11-18 DIAGNOSIS — M25561 Pain in right knee: Secondary | ICD-10-CM | POA: Diagnosis not present

## 2020-11-18 DIAGNOSIS — M545 Low back pain, unspecified: Secondary | ICD-10-CM

## 2020-11-18 DIAGNOSIS — M79605 Pain in left leg: Secondary | ICD-10-CM

## 2020-11-18 DIAGNOSIS — M2560 Stiffness of unspecified joint, not elsewhere classified: Secondary | ICD-10-CM

## 2020-11-18 DIAGNOSIS — R2681 Unsteadiness on feet: Secondary | ICD-10-CM

## 2020-11-18 DIAGNOSIS — M25562 Pain in left knee: Secondary | ICD-10-CM

## 2020-11-18 DIAGNOSIS — R29898 Other symptoms and signs involving the musculoskeletal system: Secondary | ICD-10-CM

## 2020-11-18 DIAGNOSIS — R262 Difficulty in walking, not elsewhere classified: Secondary | ICD-10-CM

## 2020-11-18 DIAGNOSIS — M5386 Other specified dorsopathies, lumbar region: Secondary | ICD-10-CM

## 2020-11-18 DIAGNOSIS — M6281 Muscle weakness (generalized): Secondary | ICD-10-CM

## 2020-11-18 NOTE — Therapy (Signed)
[facility] [address] [place], [place], [address] Phone: [phone]   Fax:  [phone]  Physical Therapy Treatment  Patient Details  Name: [name] MRN: [id number] Date of Birth: 1945/09/03 Referring Provider (PT): Erline Hau, [place]   Encounter Date: 11/18/2020   PT End of Session - 11/18/20 1326     Visit Number 32    Number of Visits 48    Date for PT Re-Evaluation 12/25/20    Authorization Type Medicare reporting period from 10/31/2020    PT Start Time 1230    PT Stop Time 1320    PT Time Calculation (min) 50 min    Equipment Utilized During Treatment Other (comment);Gait belt    Activity Tolerance Patient tolerated treatment well;No increased pain    Behavior During Therapy WFL for tasks assessed/performed             Past Medical History:  Diagnosis Date   Anxiety    Arthritis    Bilateral chronic knee pain    Chronic back pain    Depression    GERD (gastroesophageal reflux disease)    Insomnia    Memory changes    Sinus bradycardia    Urge incontinence     Past Surgical History:  Procedure Laterality Date   ABDOMINAL HYSTERECTOMY  1995   back sugery     lumbar   BIOPSY THYROID     benign goiter   BUNIONECTOMY  2013   rt foot   COLONOSCOPY     MUSCLE BIOPSY Left 10/03/2012   Procedure: LEFT QUADRICEP MUSCLE BIOPSY;  Surgeon: Odis Hollingshead, MD;  Location: [facility];  Service: General;  Laterality: Left;   NECK SURGERY  2010   cerv disc fused     There were no vitals filed for this visit.   Subjective Assessment - 11/18/20 1324     Subjective "I am a little tired.  Spent the w/e with my teenage grandchildren.  They kept me busy"    Currently in Pain? Yes    Pain Score 6     Pain Location Knee    Pain Orientation Right;Left    Pain Descriptors / Indicators Aching                                       PT Education - 11/18/20  1326     Education Details TKR's    Person(s) Educated Patient    Methods Explanation    Comprehension Verbalized understanding              PT Short Term Goals - 10/02/20 1409       PT SHORT TERM GOAL #1   Title Be independent with initial home exercise program for self-management of symptoms.    Baseline To be initiated at visit 2 as appropriate (04/03/2020); provided but not participating (04/24/2020); participating in ambulation and sit <> stand (05/07/2020; 06/26/2020); participating in ambulation practice at home (10/02/2020);    Time 2    Period Weeks    Status Partially Met    Target Date 04/17/20               PT Long Term Goals - 10/31/20 1628       PT LONG TERM GOAL #1   Title Be independent with a long-term home exercise program for self-management of symptoms.  Baseline to be updated at visit 2 as appropriate (04/03/2020); partially participating (05/07/2020; 06/26/2020; 10/02/2020); working on walking at home and climbing steps at home sit <> stands participating regularly (10/31/2020);    Time 12    Period Weeks    Status Partially Met   TARGET DATE FOR ALL LONG TERM GOALS: 06/26/2020. TARGET DATE FOR UNMET GOALS UPDATED TO 09/18/2020. UPDATED TO 12/25/2020     PT LONG TERM GOAL #2   Title Demonstrate improved FOTO score to equal or greater than 56 by visit #20 demonstrate improvement in overall condition and self-reported functional ability.    Baseline 48 (04/03/2020); 45 (05/07/2020); 45 (06/26/2020); 46 (10/02/20); 59 (10/31/2020);    Time 12    Period Weeks    Status Achieved      PT LONG TERM GOAL #3   Title Patient will improve her 10 MWT speed with her SPC to at least 1.2 m/s to promote better community ambulation and safe crossing of streets.    Baseline to be measured visit 2 (04/03/2020); 0.54 meters/second with SPC and R AFO (04/16/2020): 0.6 meters/second with SPC (05/07/2020);  0.55 meters/second with SPC (06/26/2020); 0.49 m/sec with SPC and AFO (10/02/2020); 0.64  m/s with SPC in right UE, CGA  for safety (10/31/2020);    Time 12    Period Weeks    Status Partially Met      PT LONG TERM GOAL #4   Title Patient will improve ABC score by 13 percentage points to demonstrate improved self-reported balance.    Baseline 23.8% (04/03/2020); 32.5% (05/07/2020); 26.3% (06/26/2020); 23.8% (10/02/2020); 35% (10/31/2020);    Time 12    Period Weeks    Status Partially Met      PT LONG TERM GOAL #5   Title Patient will complete 5 Times Sit to Stand test from chair height without UE support in equal or less than 14 seconds to improve B LE power and strength for transfers and improved mobility, and demonstrate decreased fall risk (threshold between 12 and 15 for increased fall risk).    Baseline 28 seconds with great difficulty from 18.5 inch plinth with B UE support on mat and knees. Very unstable (04/03/2020); 18 seconds with moderate difficulty from 18.5 inch plinth with B UE support on mat. Mildly unstable. (05/07/2020); 18 seconds from same surface with similar limitations (06/26/2020); 27 seconds with moderate difficulty from 18.5 inch plinth with B UE support on mat. Mildly unstable and did not stand up fully last rep. Limited by left glute pain (10/02/2020); 14.28 seconds with B UE support from 18.5 inch plinth with B UE support on mat (10/31/2020);    Time 12    Period Weeks    Status Partially Met      PT LONG TERM GOAL #6   Title Pateint will improve 6 Minute Walk Test distance to equal or greater than 1000 feet with LRAD to demonstrate improved activity tolerance and endurance for community mobility and participation.    Baseline to be tested visit 2 (04/04/2019); 475 feet with SPC with SBA. (04/16/2020); 562 feet with SPC and CGA (05/07/2020); 454 feet with SPC and CGA and at least two stumbles (06/26/2020); 605 feet with SPC in left UE and one stumble (10/02/2020); 680 feet with SPC in left UE, no AFO. CGA-minA to prevent falls from several stumbles when pt caught R toe on  floor.    Time 12    Period Weeks    Status Partially Met            -  Pt seen for aquatic therapy today.  Treatment took place in water 3.5-4 ft 8 in depth at the [facility] pool. Temp of water was 94.  Pt entered/exited the pool via stairs with step to gait with bilat rails -Reviewed response to prior Rx, current function, HEP compliance, and pain level.      Warm up -Pt ambulated 1 lap with noodle and 2 laps without noodle and sidestepped 2 laps with noodle  Seated Stretching: gastroc, hamstring and adductors with vc and TC  Standing Using ankle buoys     -marching, Hip flexion, hip extension and Hip abduction 2 x 10 reps each with bilat UE assist on pool edge decreasing to unilateral. Noodle kick down 2 x 10 R/L hip flex then in external rotation. Cueing for tightened abdominal and glute while completing.  -Pt edu on importance of core strength  UE with single foam hand buoy Shld flex x 10; add/abd x 10, horizontal abd/add x 10  Balance Activities: balancing sitting on yellow noodle able to attain position without pool edge support. Bicyclicing for ROM and unresisted strengthening.   Pt requires buoyancy for support and to offload joints with strengthening exercises. Viscosity of the water is needed for resistance of strengthening; water current perturbations provides challenge to standing balance unsupported, requiring increased core activation       Plan - 11/18/20 1327     Clinical Impression Statement Core and LE strengthening f/b ue then aerobic capacity training.  Pt without knee or hip discomfomrt with all aquatic challenges. Noted pt deviates toward right with unsupported amb in pool. Cueing for increased hip flex with amb rather tahn excessive circumduction due to r knee valgus deformity.    PT Treatment/Interventions ADLs/Self Care Home Management;Aquatic Therapy;Cryotherapy;Moist Heat;Functional mobility training;Stair training;Gait training;DME  Instruction;Therapeutic activities;Therapeutic exercise;Balance training;Orthotic Fit/Training;Neuromuscular re-education;Patient/family education;Manual techniques;Passive range of motion;Dry needling;Electrical Stimulation;Energy conservation;Joint Manipulations;Spinal Manipulations;Taping;Splinting    PT Home Exercise Plan [email or web address] Access Code: [email or web address]             Patient will benefit from skilled therapeutic intervention in order to improve the following deficits and impairments:  Abnormal gait, Decreased knowledge of use of DME, Impaired sensation, Improper body mechanics, Pain, Decreased coordination, Decreased mobility, Impaired tone, Postural dysfunction, Decreased activity tolerance, Decreased endurance, Decreased range of motion, Decreased strength, Impaired perceived functional ability, Difficulty walking, Decreased balance  Visit Diagnosis: Difficulty in walking, not elsewhere classified  Muscle weakness (generalized)  Pain in right leg  Decreased ROM of lumbar spine  Unsteadiness on feet  Bilateral low back pain without sciatica, unspecified chronicity  Left knee pain, unspecified chronicity  Right knee pain, unspecified chronicity  Pain in left leg  Other symptoms and signs involving the musculoskeletal system  Joint stiffness of spine     Problem List Patient Active Problem List   Diagnosis Date Noted   Chronic back pain 08/07/2019   Depression 08/07/2019   Near syncope 08/06/2019   Sinus bradycardia 08/06/2019   Weakness 11/03/2012     F  MPT 11/18/2020, 1:32 PM  [facility] [address] [place], [place], [address] Phone: [phone]   Fax:  [phone]  Name: [name] MRN: [id number] Date of Birth: 02/18/1946    

## 2020-11-19 ENCOUNTER — Ambulatory Visit (HOSPITAL_BASED_OUTPATIENT_CLINIC_OR_DEPARTMENT_OTHER): Payer: Medicare Other | Admitting: Physical Therapy

## 2020-11-20 ENCOUNTER — Ambulatory Visit: Payer: Medicare Other | Admitting: Physical Therapy

## 2020-11-20 DIAGNOSIS — L309 Dermatitis, unspecified: Secondary | ICD-10-CM | POA: Diagnosis not present

## 2020-11-21 ENCOUNTER — Ambulatory Visit (HOSPITAL_BASED_OUTPATIENT_CLINIC_OR_DEPARTMENT_OTHER): Payer: Medicare Other | Admitting: Physical Therapy

## 2020-11-26 ENCOUNTER — Encounter: Payer: Medicare Other | Admitting: Physical Therapy

## 2020-11-26 ENCOUNTER — Ambulatory Visit (HOSPITAL_BASED_OUTPATIENT_CLINIC_OR_DEPARTMENT_OTHER): Payer: Medicare Other | Admitting: Physical Therapy

## 2020-11-26 ENCOUNTER — Other Ambulatory Visit: Payer: Self-pay

## 2020-11-26 ENCOUNTER — Ambulatory Visit (HOSPITAL_BASED_OUTPATIENT_CLINIC_OR_DEPARTMENT_OTHER): Payer: Medicare Other | Attending: Diagnostic Neuroimaging | Admitting: Physical Therapy

## 2020-11-26 ENCOUNTER — Encounter (HOSPITAL_BASED_OUTPATIENT_CLINIC_OR_DEPARTMENT_OTHER): Payer: Self-pay | Admitting: Physical Therapy

## 2020-11-26 DIAGNOSIS — M5386 Other specified dorsopathies, lumbar region: Secondary | ICD-10-CM | POA: Insufficient documentation

## 2020-11-26 DIAGNOSIS — R262 Difficulty in walking, not elsewhere classified: Secondary | ICD-10-CM

## 2020-11-26 DIAGNOSIS — M25562 Pain in left knee: Secondary | ICD-10-CM | POA: Insufficient documentation

## 2020-11-26 DIAGNOSIS — M79605 Pain in left leg: Secondary | ICD-10-CM | POA: Diagnosis not present

## 2020-11-26 DIAGNOSIS — M6281 Muscle weakness (generalized): Secondary | ICD-10-CM | POA: Diagnosis not present

## 2020-11-26 DIAGNOSIS — R29898 Other symptoms and signs involving the musculoskeletal system: Secondary | ICD-10-CM

## 2020-11-26 DIAGNOSIS — M79604 Pain in right leg: Secondary | ICD-10-CM | POA: Diagnosis not present

## 2020-11-26 DIAGNOSIS — M545 Low back pain, unspecified: Secondary | ICD-10-CM | POA: Diagnosis not present

## 2020-11-26 DIAGNOSIS — M25561 Pain in right knee: Secondary | ICD-10-CM | POA: Diagnosis not present

## 2020-11-26 DIAGNOSIS — R2681 Unsteadiness on feet: Secondary | ICD-10-CM

## 2020-11-26 DIAGNOSIS — M2560 Stiffness of unspecified joint, not elsewhere classified: Secondary | ICD-10-CM | POA: Insufficient documentation

## 2020-11-26 NOTE — Therapy (Signed)
[facility] [address] [place], [place], [address] Phone: [phone]   Fax:  [phone]  Physical Therapy Treatment  Patient Details  Name: [name] MRN: [id number] Date of Birth: 23-Dec-1945 Referring Provider (PT): Erline Hau, [place]   Encounter Date: 11/26/2020   PT End of Session - 11/26/20 1539     Visit Number 33    Number of Visits 48    Date for PT Re-Evaluation 12/25/20    PT Start Time 9937    PT Stop Time 1696    PT Time Calculation (min) 40 min    Equipment Utilized During Treatment Other (comment);Gait belt    Activity Tolerance Patient tolerated treatment well;No increased pain    Behavior During Therapy WFL for tasks assessed/performed             Past Medical History:  Diagnosis Date   Anxiety    Arthritis    Bilateral chronic knee pain    Chronic back pain    Depression    GERD (gastroesophageal reflux disease)    Insomnia    Memory changes    Sinus bradycardia    Urge incontinence     Past Surgical History:  Procedure Laterality Date   ABDOMINAL HYSTERECTOMY  1995   back sugery     lumbar   BIOPSY THYROID     benign goiter   BUNIONECTOMY  2013   rt foot   COLONOSCOPY     MUSCLE BIOPSY Left 10/03/2012   Procedure: LEFT QUADRICEP MUSCLE BIOPSY;  Surgeon: Odis Hollingshead, MD;  Location: [facility];  Service: General;  Laterality: Left;   NECK SURGERY  2010   cerv disc fused     There were no vitals filed for this visit.   Subjective Assessment - 11/26/20 1542     Subjective "It's about the same."    Diagnostic tests Chest/Lumbar CT report from 01/14/2020: " IMPRESSION:  1. No CT evidence for acute thoracic, abdominal or pelvic injury.  2. No acute fracture involving the thoracic or lumbar spine.  3. Chronic findings as detailed above."    Pain Score 7     Pain Orientation Right;Left    Pain Descriptors / Indicators Aching    Pain Onset More than a month  ago    Pain Frequency Intermittent                                         PT Short Term Goals - 10/02/20 1409       PT SHORT TERM GOAL #1   Title Be independent with initial home exercise program for self-management of symptoms.    Baseline To be initiated at visit 2 as appropriate (04/03/2020); provided but not participating (04/24/2020); participating in ambulation and sit <> stand (05/07/2020; 06/26/2020); participating in ambulation practice at home (10/02/2020);    Time 2    Period Weeks    Status Partially Met    Target Date 04/17/20               PT Long Term Goals - 10/31/20 1628       PT LONG TERM GOAL #1   Title Be independent with a long-term home exercise program for self-management of symptoms.    Baseline to be updated at visit 2 as appropriate (04/03/2020); partially participating (05/07/2020; 06/26/2020; 10/02/2020); working on walking at  home and climbing steps at home sit <> stands participating regularly (10/31/2020);    Time 12    Period Weeks    Status Partially Met   TARGET DATE FOR ALL LONG TERM GOALS: 06/26/2020. TARGET DATE FOR UNMET GOALS UPDATED TO 09/18/2020. UPDATED TO 12/25/2020     PT LONG TERM GOAL #2   Title Demonstrate improved FOTO score to equal or greater than 56 by visit #20 demonstrate improvement in overall condition and self-reported functional ability.    Baseline 48 (04/03/2020); 45 (05/07/2020); 45 (06/26/2020); 46 (10/02/20); 59 (10/31/2020);    Time 12    Period Weeks    Status Achieved      PT LONG TERM GOAL #3   Title Patient will improve her 10 MWT speed with her SPC to at least 1.2 m/s to promote better community ambulation and safe crossing of streets.    Baseline to be measured visit 2 (04/03/2020); 0.54 meters/second with SPC and R AFO (04/16/2020): 0.6 meters/second with SPC (05/07/2020);  0.55 meters/second with SPC (06/26/2020); 0.49 m/sec with SPC and AFO (10/02/2020); 0.64 m/s with SPC in right UE, CGA  for safety  (10/31/2020);    Time 12    Period Weeks    Status Partially Met      PT LONG TERM GOAL #4   Title Patient will improve ABC score by 13 percentage points to demonstrate improved self-reported balance.    Baseline 23.8% (04/03/2020); 32.5% (05/07/2020); 26.3% (06/26/2020); 23.8% (10/02/2020); 35% (10/31/2020);    Time 12    Period Weeks    Status Partially Met      PT LONG TERM GOAL #5   Title Patient will complete 5 Times Sit to Stand test from chair height without UE support in equal or less than 14 seconds to improve B LE power and strength for transfers and improved mobility, and demonstrate decreased fall risk (threshold between 12 and 15 for increased fall risk).    Baseline 28 seconds with great difficulty from 18.5 inch plinth with B UE support on mat and knees. Very unstable (04/03/2020); 18 seconds with moderate difficulty from 18.5 inch plinth with B UE support on mat. Mildly unstable. (05/07/2020); 18 seconds from same surface with similar limitations (06/26/2020); 27 seconds with moderate difficulty from 18.5 inch plinth with B UE support on mat. Mildly unstable and did not stand up fully last rep. Limited by left glute pain (10/02/2020); 14.28 seconds with B UE support from 18.5 inch plinth with B UE support on mat (10/31/2020);    Time 12    Period Weeks    Status Partially Met      PT LONG TERM GOAL #6   Title Pateint will improve 6 Minute Walk Test distance to equal or greater than 1000 feet with LRAD to demonstrate improved activity tolerance and endurance for community mobility and participation.    Baseline to be tested visit 2 (04/04/2019); 475 feet with SPC with SBA. (04/16/2020); 562 feet with SPC and CGA (05/07/2020); 454 feet with SPC and CGA and at least two stumbles (06/26/2020); 605 feet with SPC in left UE and one stumble (10/02/2020); 680 feet with SPC in left UE, no AFO. CGA-minA to prevent falls from several stumbles when pt caught R toe on floor.    Time 12    Period Weeks     Status Partially Met            -Pt seen for aquatic therapy today.  Treatment took place in water  3.5-4 ft 8 in depth at the [facility] pool. Temp of water was 94.  Pt entered/exited the pool via stairs with step to gait with bilat rails  -Reviewed response to prior Rx, current function, HEP compliance, and pain level.       Warm up -Pt ambulated 1 lap with noodle and 2 laps without noodle and sidestepped 2 laps with noodle   Seated Stretching: gastroc, hamstring and adductors  3 x 20 sec hold Flutter kicking  2 trials of 1 min with vc and TC   Standing Using ankle buoys     -marching, Hip flexion, hip extension and Hip abduction 2 x 10 reps each with bilat UE assist on pool edge decreasing to unilateral. Noodle kick down x 12R/L hip flex. Cueing for tightened abdominal and glute while completing. Unilateral ue support on wall  UE with single foam hand buoy Shld flex x 10; add/abd x 10, horizontal abd/add x 15   Balance Activities: balancing sitting on yellow noodle using hand buoys for added balance support. Bicyclicing x 10 min   Pt requires buoyancy for support and to offload joints with strengthening exercises. Viscosity of the water is needed for resistance of strengthening; water current perturbations provides challenge to standing balance unsupported, requiring increased core activation       Plan - 11/26/20 1848     Clinical Impression Statement Pt with improving balance as evidenced by indep with vertical suspension on noodle and completion of noodle push down with decreased LOB and ue support.  She reports minimal change in pain left knee but is compliant with HEP and following all instructions.    Examination-Participation Restrictions Laundry;Church;Cleaning;Shop;Community Activity;Meal Prep;Driving;Yard Work    PT Treatment/Interventions ADLs/Self Care Home Management;Aquatic Therapy;Cryotherapy;Moist Heat;Functional mobility training;Stair  training;Gait training;DME Instruction;Therapeutic activities;Therapeutic exercise;Balance training;Orthotic Fit/Training;Neuromuscular re-education;Patient/family education;Manual techniques;Passive range of motion;Dry needling;Electrical Stimulation;Energy conservation;Joint Manipulations;Spinal Manipulations;Taping;Splinting    PT Next Visit Plan strengthening/balance exercises, aquatic therapy    PT Home Exercise Plan [email or web address]             Patient will benefit from skilled therapeutic intervention in order to improve the following deficits and impairments:  Abnormal gait, Decreased knowledge of use of DME, Impaired sensation, Improper body mechanics, Pain, Decreased coordination, Decreased mobility, Impaired tone, Postural dysfunction, Decreased activity tolerance, Decreased endurance, Decreased range of motion, Decreased strength, Impaired perceived functional ability, Difficulty walking, Decreased balance  Visit Diagnosis: Difficulty in walking, not elsewhere classified  Muscle weakness (generalized)  Bilateral low back pain without sciatica, unspecified chronicity  Joint stiffness of spine  Left knee pain, unspecified chronicity  Right knee pain, unspecified chronicity  Pain in right leg  Pain in left leg  Unsteadiness on feet  Other symptoms and signs involving the musculoskeletal system  Decreased ROM of lumbar spine     Problem List Patient Active Problem List   Diagnosis Date Noted   Chronic back pain 08/07/2019   Depression 08/07/2019   Near syncope 08/06/2019   Sinus bradycardia 08/06/2019   Weakness 11/03/2012    Vedia Pereyra  MPT 11/26/2020, 6:56 PM  [facility] [place], [place], [address] Phone: [phone]   Fax:  [phone]  Name: ZYLPHA POYNOR MRN: [id number] Date of Birth: 01/31/1946

## 2020-11-28 ENCOUNTER — Ambulatory Visit (HOSPITAL_BASED_OUTPATIENT_CLINIC_OR_DEPARTMENT_OTHER): Payer: Medicare Other | Admitting: Physical Therapy

## 2020-11-28 ENCOUNTER — Encounter (HOSPITAL_BASED_OUTPATIENT_CLINIC_OR_DEPARTMENT_OTHER): Payer: Self-pay | Admitting: Physical Therapy

## 2020-11-28 ENCOUNTER — Other Ambulatory Visit: Payer: Self-pay

## 2020-11-28 DIAGNOSIS — R262 Difficulty in walking, not elsewhere classified: Secondary | ICD-10-CM

## 2020-11-28 DIAGNOSIS — M79605 Pain in left leg: Secondary | ICD-10-CM

## 2020-11-28 DIAGNOSIS — M6281 Muscle weakness (generalized): Secondary | ICD-10-CM | POA: Diagnosis not present

## 2020-11-28 DIAGNOSIS — M25562 Pain in left knee: Secondary | ICD-10-CM | POA: Diagnosis not present

## 2020-11-28 DIAGNOSIS — M545 Low back pain, unspecified: Secondary | ICD-10-CM | POA: Diagnosis not present

## 2020-11-28 DIAGNOSIS — R2681 Unsteadiness on feet: Secondary | ICD-10-CM

## 2020-11-28 DIAGNOSIS — M2560 Stiffness of unspecified joint, not elsewhere classified: Secondary | ICD-10-CM | POA: Diagnosis not present

## 2020-11-28 DIAGNOSIS — M79604 Pain in right leg: Secondary | ICD-10-CM

## 2020-11-28 DIAGNOSIS — M25561 Pain in right knee: Secondary | ICD-10-CM

## 2020-11-28 NOTE — Therapy (Signed)
[facility] [address] [place], [place], [address] Phone: [phone]   Fax:  [phone]  Physical Therapy Treatment  Patient Details  Name: [name] MRN: [id number] Date of Birth: [birth year] Referring Provider (PT): Erline Hau, [place]   Encounter Date: 11/28/2020   PT End of Session - 11/28/20 1141     Visit Number 34    Number of Visits 48    Date for PT Re-Evaluation 12/25/20    PT Start Time 9030    PT Stop Time 1205    PT Time Calculation (min) 29 min    Equipment Utilized During Treatment Other (comment);Gait belt    Activity Tolerance Patient tolerated treatment well;No increased pain    Behavior During Therapy WFL for tasks assessed/performed             Past Medical History:  Diagnosis Date   Anxiety    Arthritis    Bilateral chronic knee pain    Chronic back pain    Depression    GERD (gastroesophageal reflux disease)    Insomnia    Memory changes    Sinus bradycardia    Urge incontinence     Past Surgical History:  Procedure Laterality Date   ABDOMINAL HYSTERECTOMY  1995   back sugery     lumbar   BIOPSY THYROID     benign goiter   BUNIONECTOMY  2013   rt foot   COLONOSCOPY     MUSCLE BIOPSY Left 10/03/2012   Procedure: LEFT QUADRICEP MUSCLE BIOPSY;  Surgeon: Odis Hollingshead, MD;  Location: [facility];  Service: General;  Laterality: Left;   NECK SURGERY  2010   cerv disc fused     There were no vitals filed for this visit.   Subjective Assessment - 11/28/20 1313     Subjective "Left knee is still burning its constant"    Currently in Pain? Yes    Pain Score 7     Pain Orientation Left    Pain Descriptors / Indicators Other (Comment)   stinging   Pain Type Chronic pain    Pain Frequency Constant    Aggravating Factors  anything    Pain Relieving Factors rest    Effect of Pain on Daily Activities difficulty walking                                           PT Short Term Goals - 10/02/20 1409       PT SHORT TERM GOAL #1   Title Be independent with initial home exercise program for self-management of symptoms.    Baseline To be initiated at visit 2 as appropriate (04/03/2020); provided but not participating (04/24/2020); participating in ambulation and sit <> stand (05/07/2020; 06/26/2020); participating in ambulation practice at home (10/02/2020);    Time 2    Period Weeks    Status Partially Met    Target Date 04/17/20               PT Long Term Goals - 10/31/20 1628       PT LONG TERM GOAL #1   Title Be independent with a long-term home exercise program for self-management of symptoms.    Baseline to be updated at visit 2 as appropriate (04/03/2020); partially participating (05/07/2020; 06/26/2020; 10/02/2020); working on walking at home and climbing steps at home  sit <> stands participating regularly (10/31/2020);    Time 12    Period Weeks    Status Partially Met   TARGET DATE FOR ALL LONG TERM GOALS: 06/26/2020. TARGET DATE FOR UNMET GOALS UPDATED TO 09/18/2020. UPDATED TO 12/25/2020     PT LONG TERM GOAL #2   Title Demonstrate improved FOTO score to equal or greater than 56 by visit #20 demonstrate improvement in overall condition and self-reported functional ability.    Baseline 48 (04/03/2020); 45 (05/07/2020); 45 (06/26/2020); 46 (10/02/20); 59 (10/31/2020);    Time 12    Period Weeks    Status Achieved      PT LONG TERM GOAL #3   Title Patient will improve her 10 MWT speed with her SPC to at least 1.2 m/s to promote better community ambulation and safe crossing of streets.    Baseline to be measured visit 2 (04/03/2020); 0.54 meters/second with SPC and R AFO (04/16/2020): 0.6 meters/second with SPC (05/07/2020);  0.55 meters/second with SPC (06/26/2020); 0.49 m/sec with SPC and AFO (10/02/2020); 0.64 m/s with SPC in right UE, CGA  for safety (10/31/2020);    Time 12    Period Weeks     Status Partially Met      PT LONG TERM GOAL #4   Title Patient will improve ABC score by 13 percentage points to demonstrate improved self-reported balance.    Baseline 23.8% (04/03/2020); 32.5% (05/07/2020); 26.3% (06/26/2020); 23.8% (10/02/2020); 35% (10/31/2020);    Time 12    Period Weeks    Status Partially Met      PT LONG TERM GOAL #5   Title Patient will complete 5 Times Sit to Stand test from chair height without UE support in equal or less than 14 seconds to improve B LE power and strength for transfers and improved mobility, and demonstrate decreased fall risk (threshold between 12 and 15 for increased fall risk).    Baseline 28 seconds with great difficulty from 18.5 inch plinth with B UE support on mat and knees. Very unstable (04/03/2020); 18 seconds with moderate difficulty from 18.5 inch plinth with B UE support on mat. Mildly unstable. (05/07/2020); 18 seconds from same surface with similar limitations (06/26/2020); 27 seconds with moderate difficulty from 18.5 inch plinth with B UE support on mat. Mildly unstable and did not stand up fully last rep. Limited by left glute pain (10/02/2020); 14.28 seconds with B UE support from 18.5 inch plinth with B UE support on mat (10/31/2020);    Time 12    Period Weeks    Status Partially Met      PT LONG TERM GOAL #6   Title Pateint will improve 6 Minute Walk Test distance to equal or greater than 1000 feet with LRAD to demonstrate improved activity tolerance and endurance for community mobility and participation.    Baseline to be tested visit 2 (04/04/2019); 475 feet with SPC with SBA. (04/16/2020); 562 feet with SPC and CGA (05/07/2020); 454 feet with SPC and CGA and at least two stumbles (06/26/2020); 605 feet with SPC in left UE and one stumble (10/02/2020); 680 feet with SPC in left UE, no AFO. CGA-minA to prevent falls from several stumbles when pt caught R toe on floor.    Time 12    Period Weeks    Status Partially Met            -Pt seen  for aquatic therapy today.  Treatment took place in water 3.5-4 ft 8 in depth at  the [facility] pool. Temp of water was 94.  Pt entered/exited the pool via stairs with step to gait with bilat rails   -Reviewed response to prior Rx, current function, HEP compliance, and pain level.       Warm up -Pt ambulated 1 lap with noodle and 2 laps without noodle and sidestepped 2 laps with noodle   Seated Stretching: gastroc, hamstring and adductors  3 x 20 sec hold Flutter kicking  2 trials of 1 min with vc and TC   Standing Marching supported by noodle, blue Using ankle buoys     -marching, Hip flexion, hip extension and Hip abduction 2 x 10 reps each with bilat UE assist on pool edge decreasing to unilateral. Noodle kick down 2 x 12R/L hip flex. Cueing for tightened abdominal and glute while completing. Unilateral ue support on wall   UE with single foam hand buoy Shld flex x 10; add/abd x 10, horizontal abd/add x 15       Pt requires buoyancy for support and to offload joints with strengthening exercises. Viscosity of the water is needed for resistance of strengthening; water current perturbations provides challenge to standing balance unsupported, requiring increased core activation       Plan - 11/28/20 1154     Clinical Impression Statement Pt late again, reports robo call from [facility] giving her wrong times.  She wlll get a printed copy from Network engineer on way out. Pt directed through stretching and ex as time allows.  She continues to c/o left knee burning, getting at best minimal relief when submerged, returning upon exiting pool.    PT Treatment/Interventions ADLs/Self Care Home Management;Aquatic Therapy;Cryotherapy;Moist Heat;Functional mobility training;Stair training;Gait training;DME Instruction;Therapeutic activities;Therapeutic exercise;Balance training;Orthotic Fit/Training;Neuromuscular re-education;Patient/family education;Manual techniques;Passive range of motion;Dry  needling;Electrical Stimulation;Energy conservation;Joint Manipulations;Spinal Manipulations;Taping;Splinting             Patient will benefit from skilled therapeutic intervention in order to improve the following deficits and impairments:  Abnormal gait, Decreased knowledge of use of DME, Impaired sensation, Improper body mechanics, Pain, Decreased coordination, Decreased mobility, Impaired tone, Postural dysfunction, Decreased activity tolerance, Decreased endurance, Decreased range of motion, Decreased strength, Impaired perceived functional ability, Difficulty walking, Decreased balance  Visit Diagnosis: Difficulty in walking, not elsewhere classified  Right knee pain, unspecified chronicity  Pain in right leg  Muscle weakness (generalized)  Bilateral low back pain without sciatica, unspecified chronicity  Pain in left leg  Unsteadiness on feet  Left knee pain, unspecified chronicity     Problem List Patient Active Problem List   Diagnosis Date Noted   Chronic back pain 08/07/2019   Depression 08/07/2019   Near syncope 08/06/2019   Sinus bradycardia 08/06/2019   Weakness 11/03/2012    Annamarie Major) Patricia Perales MPT  11/28/2020, 1:16 PM  [facility] [address] [place], [place], [address] Phone: [phone]   Fax:  [phone]  Name: [name] MRN: [id number] Date of Birth: 24-Nov-1945

## 2020-12-02 ENCOUNTER — Encounter: Payer: Medicare Other | Admitting: Physical Therapy

## 2020-12-02 ENCOUNTER — Ambulatory Visit (HOSPITAL_BASED_OUTPATIENT_CLINIC_OR_DEPARTMENT_OTHER): Payer: Medicare Other | Admitting: Physical Therapy

## 2020-12-03 ENCOUNTER — Ambulatory Visit (HOSPITAL_BASED_OUTPATIENT_CLINIC_OR_DEPARTMENT_OTHER): Payer: Medicare Other | Admitting: Physical Therapy

## 2020-12-04 ENCOUNTER — Ambulatory Visit: Payer: Medicare Other | Attending: Physician Assistant | Admitting: Physical Therapy

## 2020-12-04 ENCOUNTER — Encounter: Payer: Self-pay | Admitting: Physical Therapy

## 2020-12-04 DIAGNOSIS — M79605 Pain in left leg: Secondary | ICD-10-CM | POA: Diagnosis not present

## 2020-12-04 DIAGNOSIS — M25561 Pain in right knee: Secondary | ICD-10-CM | POA: Insufficient documentation

## 2020-12-04 DIAGNOSIS — R2681 Unsteadiness on feet: Secondary | ICD-10-CM | POA: Diagnosis not present

## 2020-12-04 DIAGNOSIS — R29898 Other symptoms and signs involving the musculoskeletal system: Secondary | ICD-10-CM | POA: Insufficient documentation

## 2020-12-04 DIAGNOSIS — R262 Difficulty in walking, not elsewhere classified: Secondary | ICD-10-CM | POA: Diagnosis not present

## 2020-12-04 DIAGNOSIS — M6281 Muscle weakness (generalized): Secondary | ICD-10-CM

## 2020-12-04 DIAGNOSIS — M79604 Pain in right leg: Secondary | ICD-10-CM | POA: Diagnosis not present

## 2020-12-04 DIAGNOSIS — M545 Low back pain, unspecified: Secondary | ICD-10-CM | POA: Diagnosis not present

## 2020-12-04 NOTE — Therapy (Signed)
[facility] [address], [place], [address] Phone: [phone]   Fax:  [phone]  Physical Therapy Treatment  Patient Details  Name: [name] MRN: [id number] Date of Birth: August 31, 1945 Referring Provider (PT): Erline Hau, [place]   Encounter Date: 12/04/2020   PT End of Session - 12/04/20 1441     Visit Number 35    Number of Visits 48    Date for PT Re-Evaluation 12/25/20    Authorization Type Medicare reporting period from 10/31/2020    Progress Note Due on Visit 40    PT Start Time 1350    PT Stop Time 1430    PT Time Calculation (min) 40 min    Equipment Utilized During Treatment Other (comment);Gait belt    Activity Tolerance Patient tolerated treatment well;No increased pain    Behavior During Therapy WFL for tasks assessed/performed             Past Medical History:  Diagnosis Date   Anxiety    Arthritis    Bilateral chronic knee pain    Chronic back pain    Depression    GERD (gastroesophageal reflux disease)    Insomnia    Memory changes    Sinus bradycardia    Urge incontinence     Past Surgical History:  Procedure Laterality Date   ABDOMINAL HYSTERECTOMY  1995   back sugery     lumbar   BIOPSY THYROID     benign goiter   BUNIONECTOMY  2013   rt foot   COLONOSCOPY     MUSCLE BIOPSY Left 10/03/2012   Procedure: LEFT QUADRICEP MUSCLE BIOPSY;  Surgeon: Odis Hollingshead, MD;  Location: [facility];  Service: General;  Laterality: Left;   NECK SURGERY  2010   cerv disc fused     There were no vitals filed for this visit.   Subjective Assessment - 12/04/20 1440     Subjective Patient is doing well. Patient would like to continue working in the pool. Patient reports 5/10 L knee pain currently. She states that she has been more active lately.    Pertinent History Patient is a 75 y.o. female who presents to outpatient physical therapy with a referral for  medical diagnosis thoracic spine pain, lumbar spine fusion, generalized weakness. This patient's chief complaints consist of weakness and difficulty moving R leg leading to the following functional deficits: increased difficulty with daily tasks and mobility including walking, stairs, getting in and out of the car, grocery shopping, general mobility, fear of falling.. She ambulates with a single point cane and carbon fiber AFO on the R ankle.  Relevant past medical history and comorbidities include anxiety, bilateral chronic knee pain, arthritis, chronic back pain, GERD, memory changes, sinus bradycardia, urge incontinence, R foot bunionectomy (2013), former smoker.   Patient denies hx of cancer, stroke, seizures, lung problem, major cardiac events, diabetes, unexplained weight loss, changes in bowel or bladder problems, new onset stumbling or dropping things.    Limitations Lifting;Standing;Walking;House hold activities    Currently in Pain? Yes    Pain Score 5     Pain Location Knee    Pain Orientation Left              OBJECTIVE:   FOTO SCORE: 40  TREATMENT:  use of SPC in left UE, no AFO   Therapeutic exercise: to centralize symptoms and improve ROM, strength, muscular endurance, and activity tolerance required  for successful completion of functional activities.  - seated long arc quad, 1x20, 2x25 each side, R 5# AW, L 20# AW.  - seated B hip abduction, on edge of chair with knees extended and furniture sliders under heels. Upper body braced arms of chair. 3x15 with blue theraband tied at distal thighs.  - seated R heel slides with furniture slider under R foot. 2x25 with B UE braced on plinth.  - standing alternating marches 1x15 each side with bilateral UE support  - standing alternating foot taps on step with B UE support (bar and SPC), 3x10 each side. 6 inches. CGA for safety. - standing hip diagonal abduction/extension sliding foot on furniture slider with BUE support, 2x20 each  side. Verbal cuing to stand up tall for decreased compensation and tactile cuing to keep hips still.  - ambulation ~ 100 feet to vehicle with CGA using SPC.    Pt required multimodal cuing for proper technique and to facilitate improved neuromuscular control, strength, range of motion, and functional ability resulting in improved performance and form. Required close supervision to minA  for safety during standing activities with occasional unsteadiness or stumble that patient was able to recover from with step strategy or by grabbing a nearby wall/object.        PT Education - 12/04/20 1440     Education Details exercise purpose/form.    Person(s) Educated Patient    Methods Explanation;Demonstration;Tactile cues;Verbal cues    Comprehension Verbalized understanding;Returned demonstration;Tactile cues required;Verbal cues required              PT Short Term Goals - 10/02/20 1409       PT SHORT TERM GOAL #1   Title Be independent with initial home exercise program for self-management of symptoms.    Baseline To be initiated at visit 2 as appropriate (04/03/2020); provided but not participating (04/24/2020); participating in ambulation and sit <> stand (05/07/2020; 06/26/2020); participating in ambulation practice at home (10/02/2020);    Time 2    Period Weeks    Status Partially Met    Target Date 04/17/20               PT Long Term Goals - 10/31/20 1628       PT LONG TERM GOAL #1   Title Be independent with a long-term home exercise program for self-management of symptoms.    Baseline to be updated at visit 2 as appropriate (04/03/2020); partially participating (05/07/2020; 06/26/2020; 10/02/2020); working on walking at home and climbing steps at home sit <> stands participating regularly (10/31/2020);    Time 12    Period Weeks    Status Partially Met   TARGET DATE FOR ALL LONG TERM GOALS: 06/26/2020. TARGET DATE FOR UNMET GOALS UPDATED TO 09/18/2020. UPDATED TO 12/25/2020     PT  LONG TERM GOAL #2   Title Demonstrate improved FOTO score to equal or greater than 56 by visit #20 demonstrate improvement in overall condition and self-reported functional ability.    Baseline 48 (04/03/2020); 45 (05/07/2020); 45 (06/26/2020); 46 (10/02/20); 59 (10/31/2020);    Time 12    Period Weeks    Status Achieved      PT LONG TERM GOAL #3   Title Patient will improve her 10 MWT speed with her SPC to at least 1.2 m/s to promote better community ambulation and safe crossing of streets.    Baseline to be measured visit 2 (04/03/2020); 0.54 meters/second with SPC and R AFO (04/16/2020): 0.6 meters/second with  SPC (05/07/2020);  0.55 meters/second with SPC (06/26/2020); 0.49 m/sec with SPC and AFO (10/02/2020); 0.64 m/s with SPC in right UE, CGA  for safety (10/31/2020);    Time 12    Period Weeks    Status Partially Met      PT LONG TERM GOAL #4   Title Patient will improve ABC score by 13 percentage points to demonstrate improved self-reported balance.    Baseline 23.8% (04/03/2020); 32.5% (05/07/2020); 26.3% (06/26/2020); 23.8% (10/02/2020); 35% (10/31/2020);    Time 12    Period Weeks    Status Partially Met      PT LONG TERM GOAL #5   Title Patient will complete 5 Times Sit to Stand test from chair height without UE support in equal or less than 14 seconds to improve B LE power and strength for transfers and improved mobility, and demonstrate decreased fall risk (threshold between 12 and 15 for increased fall risk).    Baseline 28 seconds with great difficulty from 18.5 inch plinth with B UE support on mat and knees. Very unstable (04/03/2020); 18 seconds with moderate difficulty from 18.5 inch plinth with B UE support on mat. Mildly unstable. (05/07/2020); 18 seconds from same surface with similar limitations (06/26/2020); 27 seconds with moderate difficulty from 18.5 inch plinth with B UE support on mat. Mildly unstable and did not stand up fully last rep. Limited by left glute pain (10/02/2020); 14.28  seconds with B UE support from 18.5 inch plinth with B UE support on mat (10/31/2020);    Time 12    Period Weeks    Status Partially Met      PT LONG TERM GOAL #6   Title Pateint will improve 6 Minute Walk Test distance to equal or greater than 1000 feet with LRAD to demonstrate improved activity tolerance and endurance for community mobility and participation.    Baseline to be tested visit 2 (04/04/2019); 475 feet with SPC with SBA. (04/16/2020); 562 feet with SPC and CGA (05/07/2020); 454 feet with SPC and CGA and at least two stumbles (06/26/2020); 605 feet with SPC in left UE and one stumble (10/02/2020); 680 feet with SPC in left UE, no AFO. CGA-minA to prevent falls from several stumbles when pt caught R toe on floor.    Time 12    Period Weeks    Status Partially Met                Plan - 12/04/20 1443     Clinical Impression Statement Patient tolerated treatment well. Session focused on bilateral hip and LE strengthening to help improve patient mobility. Patient continues to have difficulty with isolated R LE movement (especially R hip flexion) and requires tactile cues or seated position to minimize trunk/pelvis compensations and maximize LE strengthening. Patient will continue to benefit from skilled physical therapy interventions to address impairments, improve patient safety, and maximize patient functional mobility.    Personal Factors and Comorbidities Age;Comorbidity 3+;Education;Past/Current Experience;Fitness;Time since onset of injury/illness/exacerbation;Social Background    Comorbidities Relevant past medical history and comorbidities include anxiety, bilateral chronic knee pain, arthritis, chronic back pain, GERD, memory changes, sinus bradycardia, urge incontinence, R foot bunionectomy (2013), former smoker.    Examination-Activity Limitations Bed Mobility;Lift;Squat;Locomotion Level;Stand;Caring for Others;Carry;Transfers;Dressing    Examination-Participation Restrictions  Laundry;Church;Cleaning;Community Activity;Meal Prep;Driving;Yard Work    PT Frequency 2x / week    PT Duration 12 weeks    PT Treatment/Interventions ADLs/Self Care Home Management;Aquatic Therapy;Cryotherapy;Moist Heat;Functional mobility training;Stair training;Gait training;DME Instruction;Therapeutic activities;Therapeutic exercise;Balance training;Orthotic  Fit/Training;Neuromuscular re-education;Patient/family education;Manual techniques;Passive range of motion;Dry needling;Electrical Stimulation;Energy conservation;Joint Manipulations;Spinal Manipulations;Taping;Splinting    PT Next Visit Plan strengthening/balance exercises, aquatic therapy    PT Home Exercise Plan [id number]    Consulted and Agree with Plan of Care Patient             Patient will benefit from skilled therapeutic intervention in order to improve the following deficits and impairments:  Abnormal gait, Decreased knowledge of use of DME, Impaired sensation, Improper body mechanics, Pain, Decreased coordination, Decreased mobility, Impaired tone, Postural dysfunction, Decreased activity tolerance, Decreased endurance, Decreased range of motion, Decreased strength, Impaired perceived functional ability, Difficulty walking, Decreased balance  Visit Diagnosis: Difficulty in walking, not elsewhere classified  Muscle weakness (generalized)  Other symptoms and signs involving the musculoskeletal system  Bilateral low back pain without sciatica, unspecified chronicity  Unsteadiness on feet  Right knee pain, unspecified chronicity     Problem List Patient Active Problem List   Diagnosis Date Noted   Chronic back pain 08/07/2019   Depression 08/07/2019   Near syncope 08/06/2019   Sinus bradycardia 08/06/2019   Weakness 11/03/2012   Sherryll Burger, SPT  Student physical therapist under direct supervision of licensed physical therapists during the entirety of the session.   Everlean Alstrom. Graylon Good, PT, DPT 12/04/20, 3:51  PM   [facility] [address], [place], [address] Phone: [phone]   Fax:  [phone]  Name: GERARD BONUS MRN: [id number] Date of Birth: 1945/04/19

## 2020-12-06 ENCOUNTER — Ambulatory Visit (HOSPITAL_BASED_OUTPATIENT_CLINIC_OR_DEPARTMENT_OTHER): Payer: Medicare Other | Admitting: Physical Therapy

## 2020-12-10 ENCOUNTER — Ambulatory Visit (HOSPITAL_BASED_OUTPATIENT_CLINIC_OR_DEPARTMENT_OTHER): Payer: Medicare Other | Admitting: Physical Therapy

## 2020-12-10 ENCOUNTER — Ambulatory Visit: Payer: Medicare Other | Admitting: Physical Therapy

## 2020-12-11 ENCOUNTER — Ambulatory Visit (HOSPITAL_BASED_OUTPATIENT_CLINIC_OR_DEPARTMENT_OTHER): Payer: Medicare Other | Admitting: Physical Therapy

## 2020-12-11 ENCOUNTER — Ambulatory Visit: Payer: Medicare Other | Admitting: Physical Therapy

## 2020-12-11 ENCOUNTER — Encounter: Payer: Self-pay | Admitting: Physical Therapy

## 2020-12-11 DIAGNOSIS — R262 Difficulty in walking, not elsewhere classified: Secondary | ICD-10-CM

## 2020-12-11 DIAGNOSIS — M545 Low back pain, unspecified: Secondary | ICD-10-CM | POA: Diagnosis not present

## 2020-12-11 DIAGNOSIS — M6281 Muscle weakness (generalized): Secondary | ICD-10-CM

## 2020-12-11 DIAGNOSIS — M79604 Pain in right leg: Secondary | ICD-10-CM

## 2020-12-11 DIAGNOSIS — M25561 Pain in right knee: Secondary | ICD-10-CM | POA: Diagnosis not present

## 2020-12-11 DIAGNOSIS — M79605 Pain in left leg: Secondary | ICD-10-CM

## 2020-12-11 DIAGNOSIS — R29898 Other symptoms and signs involving the musculoskeletal system: Secondary | ICD-10-CM | POA: Diagnosis not present

## 2020-12-11 DIAGNOSIS — R2681 Unsteadiness on feet: Secondary | ICD-10-CM

## 2020-12-11 NOTE — Therapy (Signed)
[facility] [address], [place], [address] Phone: [phone]   Fax:  [phone]  Physical Therapy Treatment  Patient Details  Name: [name] MRN: [id number] Date of Birth: [birth year] Referring Provider (PT): Erline Hau, [place]   Encounter Date: 12/11/2020   PT End of Session - 12/11/20 1405     Visit Number 36    Number of Visits 48    Date for PT Re-Evaluation 12/25/20    Authorization Type Medicare reporting period from 10/31/2020    Progress Note Due on Visit 40    PT Start Time 1355    PT Stop Time 1430    PT Time Calculation (min) 35 min    Equipment Utilized During Treatment Other (comment);Gait belt    Activity Tolerance Patient tolerated treatment well;No increased pain    Behavior During Therapy WFL for tasks assessed/performed             Past Medical History:  Diagnosis Date   Anxiety    Arthritis    Bilateral chronic knee pain    Chronic back pain    Depression    GERD (gastroesophageal reflux disease)    Insomnia    Memory changes    Sinus bradycardia    Urge incontinence     Past Surgical History:  Procedure Laterality Date   ABDOMINAL HYSTERECTOMY  1995   back sugery     lumbar   BIOPSY THYROID     benign goiter   BUNIONECTOMY  2013   rt foot   COLONOSCOPY     MUSCLE BIOPSY Left 10/03/2012   Procedure: LEFT QUADRICEP MUSCLE BIOPSY;  Surgeon: Odis Hollingshead, MD;  Location: [facility];  Service: General;  Laterality: Left;   NECK SURGERY  2010   cerv disc fused     There were no vitals filed for this visit.   Subjective Assessment - 12/11/20 1403     Subjective Reports stinging/burning sensation in L leg. Patient would not describe sensation as pain or provide pain rating. She reports 7/10 pain in L knee. She believes pain is higher because she was on her feet all weekend while visiting family.    Pertinent History Patient is a 75 y.o.  female who presents to outpatient physical therapy with a referral for medical diagnosis thoracic spine pain, lumbar spine fusion, generalized weakness. This patient's chief complaints consist of weakness and difficulty moving R leg leading to the following functional deficits: increased difficulty with daily tasks and mobility including walking, stairs, getting in and out of the car, grocery shopping, general mobility, fear of falling.. She ambulates with a single point cane and carbon fiber AFO on the R ankle.  Relevant past medical history and comorbidities include anxiety, bilateral chronic knee pain, arthritis, chronic back pain, GERD, memory changes, sinus bradycardia, urge incontinence, R foot bunionectomy (2013), former smoker.   Patient denies hx of cancer, stroke, seizures, lung problem, major cardiac events, diabetes, unexplained weight loss, changes in bowel or bladder problems, new onset stumbling or dropping things.    Limitations Lifting;Standing;Walking;House hold activities    Pain Score 7     Pain Location Knee    Pain Orientation Left               Patient used SPC in left UE, R AFO   Therapeutic exercise: to centralize symptoms and improve ROM, strength, muscular endurance, and activity tolerance required for successful completion of  functional activities.  - seated long arc quad, 3x25 each side, R 5# AW, L 20# AW.  - sit <> stand from 18.5 inch table with BUE support and SBA 2x10. - seated B hip abduction, on edge of chair with knees extended and furniture sliders under heels. Upper body braced arms of chair. 3x15 with blue theraband tied at distal thighs.  - seated R heel slides with furniture slider under R foot. 1x15, and 2x20 with B UE braced on plinth.  - standing hip diagonal abduction/extension sliding foot on furniture slider with BUE support, 2x20 each side. Verbal cuing to stand up tall for decreased compensation and tactile cuing to keep hips still.  - ambulation  ~ 100 feet to vehicle with CGA using SPC.     Pt required multimodal cuing for proper technique and to facilitate improved neuromuscular control, strength, range of motion, and functional ability resulting in improved performance and form. Required close supervision to minA  for safety during standing activities with occasional unsteadiness or stumble that patient was able to recover from with step strategy or by grabbing a nearby wall/object.         PT Education - 12/11/20 1404     Education Details exercise purpose/form.    Person(s) Educated Patient    Methods Explanation;Demonstration;Tactile cues;Verbal cues    Comprehension Verbalized understanding;Returned demonstration;Verbal cues required;Tactile cues required              PT Short Term Goals - 10/02/20 1409       PT SHORT TERM GOAL #1   Title Be independent with initial home exercise program for self-management of symptoms.    Baseline To be initiated at visit 2 as appropriate (04/03/2020); provided but not participating (04/24/2020); participating in ambulation and sit <> stand (05/07/2020; 06/26/2020); participating in ambulation practice at home (10/02/2020);    Time 2    Period Weeks    Status Partially Met    Target Date 04/17/20               PT Long Term Goals - 10/31/20 1628       PT LONG TERM GOAL #1   Title Be independent with a long-term home exercise program for self-management of symptoms.    Baseline to be updated at visit 2 as appropriate (04/03/2020); partially participating (05/07/2020; 06/26/2020; 10/02/2020); working on walking at home and climbing steps at home sit <> stands participating regularly (10/31/2020);    Time 12    Period Weeks    Status Partially Met   TARGET DATE FOR ALL LONG TERM GOALS: 06/26/2020. TARGET DATE FOR UNMET GOALS UPDATED TO 09/18/2020. UPDATED TO 12/25/2020     PT LONG TERM GOAL #2   Title Demonstrate improved FOTO score to equal or greater than 56 by visit #20 demonstrate  improvement in overall condition and self-reported functional ability.    Baseline 48 (04/03/2020); 45 (05/07/2020); 45 (06/26/2020); 46 (10/02/20); 59 (10/31/2020);    Time 12    Period Weeks    Status Achieved      PT LONG TERM GOAL #3   Title Patient will improve her 10 MWT speed with her SPC to at least 1.2 m/s to promote better community ambulation and safe crossing of streets.    Baseline to be measured visit 2 (04/03/2020); 0.54 meters/second with SPC and R AFO (04/16/2020): 0.6 meters/second with SPC (05/07/2020);  0.55 meters/second with SPC (06/26/2020); 0.49 m/sec with SPC and AFO (10/02/2020); 0.64 m/s with SPC in right  UE, CGA  for safety (10/31/2020);    Time 12    Period Weeks    Status Partially Met      PT LONG TERM GOAL #4   Title Patient will improve ABC score by 13 percentage points to demonstrate improved self-reported balance.    Baseline 23.8% (04/03/2020); 32.5% (05/07/2020); 26.3% (06/26/2020); 23.8% (10/02/2020); 35% (10/31/2020);    Time 12    Period Weeks    Status Partially Met      PT LONG TERM GOAL #5   Title Patient will complete 5 Times Sit to Stand test from chair height without UE support in equal or less than 14 seconds to improve B LE power and strength for transfers and improved mobility, and demonstrate decreased fall risk (threshold between 12 and 15 for increased fall risk).    Baseline 28 seconds with great difficulty from 18.5 inch plinth with B UE support on mat and knees. Very unstable (04/03/2020); 18 seconds with moderate difficulty from 18.5 inch plinth with B UE support on mat. Mildly unstable. (05/07/2020); 18 seconds from same surface with similar limitations (06/26/2020); 27 seconds with moderate difficulty from 18.5 inch plinth with B UE support on mat. Mildly unstable and did not stand up fully last rep. Limited by left glute pain (10/02/2020); 14.28 seconds with B UE support from 18.5 inch plinth with B UE support on mat (10/31/2020);    Time 12    Period Weeks     Status Partially Met      PT LONG TERM GOAL #6   Title Pateint will improve 6 Minute Walk Test distance to equal or greater than 1000 feet with LRAD to demonstrate improved activity tolerance and endurance for community mobility and participation.    Baseline to be tested visit 2 (04/04/2019); 475 feet with SPC with SBA. (04/16/2020); 562 feet with SPC and CGA (05/07/2020); 454 feet with SPC and CGA and at least two stumbles (06/26/2020); 605 feet with SPC in left UE and one stumble (10/02/2020); 680 feet with SPC in left UE, no AFO. CGA-minA to prevent falls from several stumbles when pt caught R toe on floor.    Time 12    Period Weeks    Status Partially Met                   Plan - 12/11/20 1642     Clinical Impression Statement Treatment session was limited by patient late arrival to PT. Patient was provided with an up to date PT schedule during today's session. Patient tolerated treatment well. However, patient responded well to progression of reps with LAQ however patient continues to demonstrated difficulty with eccentric control while performing exercise. Patient continues to demonstrate impaired LE strength and relies on trunk/LE compensations while performing strengtheing exercises as well. Patient will continue to benefit from skilled physical therapy interventions to address impairments, improve patient safety, and maximize patient functional mobility.    Personal Factors and Comorbidities Age;Comorbidity 3+;Education;Past/Current Experience;Fitness;Time since onset of injury/illness/exacerbation;Social Background    Comorbidities Relevant past medical history and comorbidities include anxiety, bilateral chronic knee pain, arthritis, chronic back pain, GERD, memory changes, sinus bradycardia, urge incontinence, R foot bunionectomy (2013), former smoker.    Examination-Activity Limitations Bed Mobility;Lift;Squat;Locomotion Level;Stand;Caring for Others;Carry;Transfers;Dressing     Examination-Participation Restrictions Laundry;Church;Cleaning;Community Activity;Meal Prep;Driving;Yard Work    PT Frequency 2x / week    PT Duration 12 weeks    PT Treatment/Interventions ADLs/Self Care Home Management;Aquatic Therapy;Cryotherapy;Moist Heat;Functional mobility training;Stair training;Gait training;DME Instruction;Therapeutic  activities;Therapeutic exercise;Balance training;Orthotic Fit/Training;Neuromuscular re-education;Patient/family education;Manual techniques;Passive range of motion;Dry needling;Electrical Stimulation;Energy conservation;Joint Manipulations;Spinal Manipulations;Taping;Splinting    PT Next Visit Plan strengthening/balance exercises, aquatic therapy    PT Home Exercise Plan [id number]    Consulted and Agree with Plan of Care Patient             Patient will benefit from skilled therapeutic intervention in order to improve the following deficits and impairments:  Abnormal gait, Decreased knowledge of use of DME, Impaired sensation, Improper body mechanics, Pain, Decreased coordination, Decreased mobility, Impaired tone, Postural dysfunction, Decreased activity tolerance, Decreased endurance, Decreased range of motion, Decreased strength, Impaired perceived functional ability, Difficulty walking, Decreased balance  Visit Diagnosis: Difficulty in walking, not elsewhere classified  Muscle weakness (generalized)  Other symptoms and signs involving the musculoskeletal system  Bilateral low back pain without sciatica, unspecified chronicity  Unsteadiness on feet  Pain in left leg  Pain in right leg     Problem List Patient Active Problem List   Diagnosis Date Noted   Chronic back pain 08/07/2019   Depression 08/07/2019   Near syncope 08/06/2019   Sinus bradycardia 08/06/2019   Weakness 11/03/2012    [name], SPT  Student physical therapist under direct supervision of licensed physical therapists during the entirety of the session.    Everlean Alstrom. Graylon Good, PT, DPT 12/11/20, 5:03 PM  [facility] [address], [place], [address] Phone: [phone]   Fax:  [phone]  Name: [name] MRN: [id number] Date of Birth: 11/13/1945

## 2020-12-18 ENCOUNTER — Encounter: Payer: Self-pay | Admitting: Physical Therapy

## 2020-12-18 ENCOUNTER — Ambulatory Visit: Payer: Medicare Other | Admitting: Physical Therapy

## 2020-12-18 DIAGNOSIS — R2681 Unsteadiness on feet: Secondary | ICD-10-CM

## 2020-12-18 DIAGNOSIS — M545 Low back pain, unspecified: Secondary | ICD-10-CM

## 2020-12-18 DIAGNOSIS — R29898 Other symptoms and signs involving the musculoskeletal system: Secondary | ICD-10-CM | POA: Diagnosis not present

## 2020-12-18 DIAGNOSIS — R262 Difficulty in walking, not elsewhere classified: Secondary | ICD-10-CM

## 2020-12-18 DIAGNOSIS — M6281 Muscle weakness (generalized): Secondary | ICD-10-CM

## 2020-12-18 DIAGNOSIS — M79604 Pain in right leg: Secondary | ICD-10-CM

## 2020-12-18 DIAGNOSIS — M79605 Pain in left leg: Secondary | ICD-10-CM

## 2020-12-18 DIAGNOSIS — M25561 Pain in right knee: Secondary | ICD-10-CM | POA: Diagnosis not present

## 2020-12-18 NOTE — Therapy (Signed)
[facility] [address] [place], [address] Phone: [phone]   Fax:  [phone]  Physical Therapy Treatment / Progress Note / Re-Certification Dates of reporting 10/31/2020 - 12/18/2020  Patient Details  Name: [name] MRN: [id number] Date of Birth: 01-25-1946 Referring Provider (PT): Erline Hau, [place]   Encounter Date: 12/18/2020   PT End of Session - 12/18/20 1536     Visit Number 37    Number of Visits 48    Date for PT Re-Evaluation 03/12/21    Authorization Type Medicare reporting period from 10/31/2020    Progress Note Due on Visit 40    PT Start Time 1518    PT Stop Time 1600    PT Time Calculation (min) 42 min    Equipment Utilized During Treatment Other (comment);Gait belt   SPC   Activity Tolerance Patient tolerated treatment well;No increased pain    Behavior During Therapy WFL for tasks assessed/performed             Past Medical History:  Diagnosis Date   Anxiety    Arthritis    Bilateral chronic knee pain    Chronic back pain    Depression    GERD (gastroesophageal reflux disease)    Insomnia    Memory changes    Sinus bradycardia    Urge incontinence     Past Surgical History:  Procedure Laterality Date   ABDOMINAL HYSTERECTOMY  1995   back sugery     lumbar   BIOPSY THYROID     benign goiter   BUNIONECTOMY  2013   rt foot   COLONOSCOPY     MUSCLE BIOPSY Left 10/03/2012   Procedure: LEFT QUADRICEP MUSCLE BIOPSY;  Surgeon: Odis Hollingshead, MD;  Location: [facility];  Service: General;  Laterality: Left;   NECK SURGERY  2010   cerv disc fused     There were no vitals filed for this visit.   Subjective Assessment - 12/18/20 1520     Subjective Patient reports she is feeling well and does not have any more pain than usual. She is not wearing her AFO today and states she has been wearing it less since she got new shoes and thinks they need to  stretch out so the AFO can fit. No falls recently. States she likes the pool but is not sure she will continue there when it gets cold. "I'm tired of therapy but it keeps me going." States her MD has instructed her to be in PT for the rest of her life. States she still has has her updated schedule.    Pertinent History Patient is a 75 y.o. female who presents to outpatient physical therapy with a referral for medical diagnosis thoracic spine pain, lumbar spine fusion, generalized weakness. This patient's chief complaints consist of weakness and difficulty moving R leg leading to the following functional deficits: increased difficulty with daily tasks and mobility including walking, stairs, getting in and out of the car, grocery shopping, general mobility, fear of falling.. She ambulates with a single point cane and carbon fiber AFO on the R ankle.  Relevant past medical history and comorbidities include anxiety, bilateral chronic knee pain, arthritis, chronic back pain, GERD, memory changes, sinus bradycardia, urge incontinence, R foot bunionectomy (2013), former smoker.   Patient denies hx of cancer, stroke, seizures, lung problem, major cardiac events, diabetes, unexplained weight loss, changes in bowel or bladder problems, new onset  stumbling or dropping things.    Limitations Lifting;Standing;Walking;House hold activities    Diagnostic tests Chest/Lumbar CT report from 01/14/2020: " IMPRESSION:  1. No CT evidence for acute thoracic, abdominal or pelvic injury.  2. No acute fracture involving the thoracic or lumbar spine.  3. Chronic findings as detailed above."    Patient Stated Goals "to get as much out of it as I can to move better"    Currently in Pain? Yes    Pain Location Knee    Pain Orientation Right    Pain Type Chronic pain    Pain Onset More than a month ago    Pain Frequency Constant               [facility] PT Assessment - 12/18/20 0001       Assessment   Medical Diagnosis pain in  thoracic spine, fusion of lumbar spine, muscle weakness    Referring Provider (PT) Rudean Curt, Louanna Raw, PA-C    Prior Therapy several episodes with improvement in function      Precautions   Precautions Fall      Restrictions   Weight Bearing Restrictions No      Home Environment   Living Environment --   no concerns about getting around home     Prior Function   Level of Independence Independent with household mobility with device;Independent with community mobility with device;Other (comment);Independent with basic ADLs   help with getting things off the floor if she drops something     Cognition   Overall Cognitive Status Within Functional Limits for tasks assessed             OBJECTIVE FOTO = 45 (12/18/2020); ABC scale = 38.1% (12/18/2020): Five Time Sit to Stand (5TSTS): 13.9 seconds with B UE support from 18.5 inch plinth with B UE support on mat. Mildly unstable and did not stand up fully 1st rep. Complains of right knee pain but demonstrates improved confidence.  6MWT: 656 feet with SPC in R UE, no AFO. CGA-minA to prevent falls from 3 stumbles when pt caught R toe on floor. 10MWT: 0.67 m/s with SPC in right UE, no AFO, CGA  for safety (no stumbles)   Patient used SPC in left UE, no AFO today   Therapeutic exercise: to centralize symptoms and improve ROM, strength, muscular endurance, and activity tolerance required for successful completion of functional activities.  - ambulation around clinic for distance in 6 minutes with SPC in LE UE and no AFO. CGA-minA to prevent falls from several stumbles when pt caught R toe on floor. - ambulation with SPC 1x30 feet for speed - sit <> stand from 18.5 inch plinth with BUE support and SBA 1x5 for speed.  - TRX sit <> stand to green chair, 2x10 with CGA for safety. Cuing to keep knees apart to improve LE alignment, cuing to control descent but continues to have poor eccentric control).  - alternating foot taps on 6 inch step with U UE  support, 3x10 each side, CGA for safety.  - ambulation ~ 100 feet to vehicle with CGA using SPC.   Pt required multimodal cuing for proper technique and to facilitate improved neuromuscular control, strength, range of motion, and functional ability resulting in improved performance and form. Required close supervision to minA  for safety during standing activities with occasional unsteadiness or stumble that patient was able to recover from with step strategy or by grabbing a nearby wall/object.     PT Education -  12/18/20 2021     Education Details exercise purpose/form. POC    Person(s) Educated Patient    Methods Explanation;Demonstration;Tactile cues;Verbal cues    Comprehension Verbalized understanding;Returned demonstration;Verbal cues required;Tactile cues required;Need further instruction              PT Short Term Goals - 10/02/20 1409       PT SHORT TERM GOAL #1   Title Be independent with initial home exercise program for self-management of symptoms.    Baseline To be initiated at visit 2 as appropriate (04/03/2020); provided but not participating (04/24/2020); participating in ambulation and sit <> stand (05/07/2020; 06/26/2020); participating in ambulation practice at home (10/02/2020);    Time 2    Period Weeks    Status Partially Met    Target Date 04/17/20               PT Long Term Goals - 12/18/20 1542       PT LONG TERM GOAL #1   Title Be independent with a long-term home exercise program for self-management of symptoms.    Baseline to be updated at visit 2 as appropriate (04/03/2020); partially participating (05/07/2020; 06/26/2020; 10/02/2020); working on walking at home and climbing steps at home sit <> stands participating regularly (10/31/2020); continues to walk regularly (12/18/2020);    Time 12    Period Weeks    Status Partially Met   TARGET DATE FOR ALL LONG TERM GOALS: 06/26/2020. TARGET DATE FOR UNMET GOALS UPDATED TO 09/18/2020. UPDATED TO 12/25/2020. UPDATED  TO 03/12/2021     PT LONG TERM GOAL #2   Title Demonstrate improved FOTO score to equal or greater than 56 by visit #20 demonstrate improvement in overall condition and self-reported functional ability.    Baseline 48 (04/03/2020); 45 (05/07/2020); 45 (06/26/2020); 46 (10/02/20); 59 (10/31/2020); 45 (12/18/2020);    Time 12    Period Weeks    Status Partially Met      PT LONG TERM GOAL #3   Title Patient will improve her 10 MWT speed with her SPC to at least 1.2 m/s to promote better community ambulation and safe crossing of streets.    Baseline to be measured visit 2 (04/03/2020); 0.54 meters/second with SPC and R AFO (04/16/2020): 0.6 meters/second with SPC (05/07/2020);  0.55 meters/second with SPC (06/26/2020); 0.49 m/sec with SPC and AFO (10/02/2020); 0.64 m/s with SPC in right UE, CGA  for safety (10/31/2020); 0.67 m/s with SPC in right UE, no AFO, CGA  for safety (no stumbles) (12/18/2020);    Time 12    Period Weeks    Status Partially Met      PT LONG TERM GOAL #4   Title Patient will improve ABC score by 13 percentage points to demonstrate improved self-reported balance.    Baseline 23.8% (04/03/2020); 32.5% (05/07/2020); 26.3% (06/26/2020); 23.8% (10/02/2020); 35% (10/31/2020); 38.1% (12/18/2020);    Time 12    Period Weeks    Status Partially Met      PT LONG TERM GOAL #5   Title Patient will complete 5 Times Sit to Stand test from chair height without UE support in equal or less than 14 seconds to improve B LE power and strength for transfers and improved mobility, and demonstrate decreased fall risk (threshold between 12 and 15 for increased fall risk).    Baseline 28 seconds with great difficulty from 18.5 inch plinth with B UE support on mat and knees. Very unstable (04/03/2020); 18 seconds with moderate difficulty from 18.5  inch plinth with B UE support on mat. Mildly unstable. (05/07/2020); 18 seconds from same surface with similar limitations (06/26/2020); 27 seconds with moderate difficulty from  18.5 inch plinth with B UE support on mat. Mildly unstable and did not stand up fully last rep. Limited by left glute pain (10/02/2020); 14.28 seconds with B UE support from 18.5 inch plinth with B UE support on mat (10/31/2020); 13.9 seconds with B UE support from 18.5 inch plinth with B UE support on mat. Mildly unstable and did not stand up fully 1st rep. Complains of right knee pain but demonstrates improved confidence (12/18/2020);    Time 12    Period Weeks    Status Partially Met      PT LONG TERM GOAL #6   Title Pateint will improve 6 Minute Walk Test distance to equal or greater than 1000 feet with LRAD to demonstrate improved activity tolerance and endurance for community mobility and participation.    Baseline to be tested visit 2 (04/04/2019); 475 feet with SPC with SBA. (04/16/2020); 562 feet with SPC and CGA (05/07/2020); 454 feet with SPC and CGA and at least two stumbles (06/26/2020); 605 feet with SPC in left UE and one stumble (10/02/2020); 680 feet with SPC in left UE, no AFO. CGA-minA to prevent falls from several stumbles when pt caught R toe on floor. (10/31/2020); 656 feet with SPC in R UE, no AFO. CGA-minA to prevent falls from 3 stumbles when pt caught R toe on floor (12/18/2020);    Time 12    Period Weeks    Status Partially Met                   Plan - 12/18/20 2019     Clinical Impression Statement Patient has attended 37 physical therapy sessions this episode of care. She continues to demonstrate improvements in functional tests since initial eval but fluctuates slightly in her scores over time since starting PT. 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 This also puts chronic abnormal force on her right knee where she suffers form pain and signs of arthritic changes and dysfunction. Patient  has been advised by her physician to continue PT regularly for the rest of her life to prevent loss of function. Physical therapy is medically neccessary slow decline in function and maximize functional independence and mobility, preventing or delaying need for higher level of care. She demonstrates increased fall risk with frequent stumbles, difficulty with community walking distances (6MWT 645 feet), decreased balance and speed for functional transfers and LE strength (5TSTS 13.9 seconds with B UE support and unsteadiness), and walking speed less than required to safely cross a street during light change (pt demo 0.67 m/s, safe crossing speed 1.2 m/s). These measured demonstrate need for continued physical therapy. Patient would benefit from aquatic setting where buoyancy reduces pain of weight bearing during therapeutic interventions. Recommend she continue aquatic therapy at this time. Patient would benefit from continued management of limiting condition by skilled physical therapist to address remaining impairments and functional limitations to work towards stated goals and return to PLOF or maximal functional independence.    Personal Factors and Comorbidities Age;Comorbidity 3+;Education;Past/Current Experience;Fitness;Time since onset of injury/illness/exacerbation;Social Background    Comorbidities Relevant past medical history and comorbidities include anxiety, bilateral chronic knee pain, arthritis, chronic  back pain, GERD, memory changes, sinus bradycardia, urge incontinence, R foot bunionectomy (2013), former smoker.    Examination-Activity Limitations Bed Mobility;Lift;Squat;Locomotion Level;Stand;Caring for Others;Carry;Transfers;Dressing    Examination-Participation Restrictions Laundry;Church;Cleaning;Community Activity;Meal Prep;Driving;Yard Work    PT Frequency 2x / week    PT Duration 12 weeks    PT Treatment/Interventions ADLs/Self Care Home Management;Aquatic Therapy;Cryotherapy;Moist  Heat;Functional mobility training;Stair training;Gait training;DME Instruction;Therapeutic activities;Therapeutic exercise;Balance training;Orthotic Fit/Training;Neuromuscular re-education;Patient/family education;Manual techniques;Passive range of motion;Dry needling;Electrical Stimulation;Energy conservation;Joint Manipulations;Spinal Manipulations;Taping;Splinting    PT Next Visit Plan strengthening/balance exercises, aquatic therapy    PT Home Exercise Plan [id number]    Consulted and Agree with Plan of Care Patient             Patient will benefit from skilled therapeutic intervention in order to improve the following deficits and impairments:  Abnormal gait, Decreased knowledge of use of DME, Impaired sensation, Improper body mechanics, Pain, Decreased coordination, Decreased mobility, Impaired tone, Postural dysfunction, Decreased activity tolerance, Decreased endurance, Decreased range of motion, Decreased strength, Impaired perceived functional ability, Difficulty walking, Decreased balance  Visit Diagnosis: Difficulty in walking, not elsewhere classified  Muscle weakness (generalized)  Other symptoms and signs involving the musculoskeletal system  Bilateral low back pain without sciatica, unspecified chronicity  Unsteadiness on feet  Pain in left leg  Pain in right leg     Problem List Patient Active Problem List   Diagnosis Date Noted   Chronic back pain 08/07/2019   Depression 08/07/2019   Near syncope 08/06/2019   Sinus bradycardia 08/06/2019   Weakness 11/03/2012    Everlean Alstrom. Graylon Good, PT, DPT 12/18/20, 8:22 PM   [facility] [address], [place], [address] Phone: [phone]   Fax:  [phone]  Name: [name] MRN: [id number] Date of Birth: [birth year]

## 2020-12-23 ENCOUNTER — Ambulatory Visit (HOSPITAL_BASED_OUTPATIENT_CLINIC_OR_DEPARTMENT_OTHER): Payer: Medicare Other | Admitting: Physical Therapy

## 2020-12-23 ENCOUNTER — Ambulatory Visit (HOSPITAL_BASED_OUTPATIENT_CLINIC_OR_DEPARTMENT_OTHER): Payer: Medicare Other | Attending: Diagnostic Neuroimaging | Admitting: Physical Therapy

## 2020-12-23 ENCOUNTER — Other Ambulatory Visit: Payer: Self-pay

## 2020-12-23 DIAGNOSIS — R29898 Other symptoms and signs involving the musculoskeletal system: Secondary | ICD-10-CM | POA: Diagnosis not present

## 2020-12-23 DIAGNOSIS — R2681 Unsteadiness on feet: Secondary | ICD-10-CM | POA: Insufficient documentation

## 2020-12-23 DIAGNOSIS — M545 Low back pain, unspecified: Secondary | ICD-10-CM | POA: Insufficient documentation

## 2020-12-23 DIAGNOSIS — M6281 Muscle weakness (generalized): Secondary | ICD-10-CM | POA: Insufficient documentation

## 2020-12-23 DIAGNOSIS — M25561 Pain in right knee: Secondary | ICD-10-CM | POA: Insufficient documentation

## 2020-12-23 DIAGNOSIS — M2560 Stiffness of unspecified joint, not elsewhere classified: Secondary | ICD-10-CM | POA: Diagnosis not present

## 2020-12-23 DIAGNOSIS — M79604 Pain in right leg: Secondary | ICD-10-CM | POA: Insufficient documentation

## 2020-12-23 DIAGNOSIS — R262 Difficulty in walking, not elsewhere classified: Secondary | ICD-10-CM | POA: Insufficient documentation

## 2020-12-23 DIAGNOSIS — M5386 Other specified dorsopathies, lumbar region: Secondary | ICD-10-CM | POA: Diagnosis not present

## 2020-12-23 DIAGNOSIS — M25562 Pain in left knee: Secondary | ICD-10-CM | POA: Diagnosis not present

## 2020-12-23 DIAGNOSIS — M79605 Pain in left leg: Secondary | ICD-10-CM | POA: Diagnosis not present

## 2020-12-23 NOTE — Therapy (Signed)
[facility] [address] [place], [place], [address] Phone: [phone]   Fax:  [phone]  Physical Therapy Treatment  Patient Details  Name: [name] MRN: [id number] Date of Birth: [birth year] Referring Provider (PT): Erline Hau, [place]   Encounter Date: 12/23/2020   PT End of Session - 12/23/20 1218     Visit Number 38    Number of Visits 48    Date for PT Re-Evaluation 03/12/21    Authorization Type Medicare reporting period from 10/31/2020    PT Start Time 1210    PT Stop Time 1255    PT Time Calculation (min) 45 min    Equipment Utilized During Treatment Other (comment);Gait belt    Activity Tolerance Patient tolerated treatment well;No increased pain    Behavior During Therapy WFL for tasks assessed/performed             Past Medical History:  Diagnosis Date   Anxiety    Arthritis    Bilateral chronic knee pain    Chronic back pain    Depression    GERD (gastroesophageal reflux disease)    Insomnia    Memory changes    Sinus bradycardia    Urge incontinence     Past Surgical History:  Procedure Laterality Date   ABDOMINAL HYSTERECTOMY  1995   back sugery     lumbar   BIOPSY THYROID     benign goiter   BUNIONECTOMY  2013   rt foot   COLONOSCOPY     MUSCLE BIOPSY Left 10/03/2012   Procedure: LEFT QUADRICEP MUSCLE BIOPSY;  Surgeon: Odis Hollingshead, MD;  Location: [facility];  Service: General;  Laterality: Left;   NECK SURGERY  2010   cerv disc fused     There were no vitals filed for this visit.   Subjective Assessment - 12/23/20 1227     Subjective "Usual pain." no complaints of concerns. Wore her AFO today due to the long walk back to the pool    Currently in Pain? Yes    Pain Score 7     Pain Type Chronic pain    Pain Onset More than a month ago    Pain Frequency Constant                                        PT Education -  12/23/20 1327     Education Details TKR surgery and rehab    Person(s) Educated Patient    Methods Explanation    Comprehension Verbalized understanding              PT Short Term Goals - 10/02/20 1409       PT SHORT TERM GOAL #1   Title Be independent with initial home exercise program for self-management of symptoms.    Baseline To be initiated at visit 2 as appropriate (04/03/2020); provided but not participating (04/24/2020); participating in ambulation and sit <> stand (05/07/2020; 06/26/2020); participating in ambulation practice at home (10/02/2020);    Time 2    Period Weeks    Status Partially Met    Target Date 04/17/20               PT Long Term Goals - 12/18/20 1542       PT LONG TERM GOAL #1   Title Be independent with a long-term home exercise  program for self-management of symptoms.    Baseline to be updated at visit 2 as appropriate (04/03/2020); partially participating (05/07/2020; 06/26/2020; 10/02/2020); working on walking at home and climbing steps at home sit <> stands participating regularly (10/31/2020); continues to walk regularly (12/18/2020);    Time 12    Period Weeks    Status Partially Met   TARGET DATE FOR ALL LONG TERM GOALS: 06/26/2020. TARGET DATE FOR UNMET GOALS UPDATED TO 09/18/2020. UPDATED TO 12/25/2020. UPDATED TO 03/12/2021     PT LONG TERM GOAL #2   Title Demonstrate improved FOTO score to equal or greater than 56 by visit #20 demonstrate improvement in overall condition and self-reported functional ability.    Baseline 48 (04/03/2020); 45 (05/07/2020); 45 (06/26/2020); 46 (10/02/20); 59 (10/31/2020); 45 (12/18/2020);    Time 12    Period Weeks    Status Partially Met      PT LONG TERM GOAL #3   Title Patient will improve her 10 MWT speed with her SPC to at least 1.2 m/s to promote better community ambulation and safe crossing of streets.    Baseline to be measured visit 2 (04/03/2020); 0.54 meters/second with SPC and R AFO (04/16/2020): 0.6  meters/second with SPC (05/07/2020);  0.55 meters/second with SPC (06/26/2020); 0.49 m/sec with SPC and AFO (10/02/2020); 0.64 m/s with SPC in right UE, CGA  for safety (10/31/2020); 0.67 m/s with SPC in right UE, no AFO, CGA  for safety (no stumbles) (12/18/2020);    Time 12    Period Weeks    Status Partially Met      PT LONG TERM GOAL #4   Title Patient will improve ABC score by 13 percentage points to demonstrate improved self-reported balance.    Baseline 23.8% (04/03/2020); 32.5% (05/07/2020); 26.3% (06/26/2020); 23.8% (10/02/2020); 35% (10/31/2020); 38.1% (12/18/2020);    Time 12    Period Weeks    Status Partially Met      PT LONG TERM GOAL #5   Title Patient will complete 5 Times Sit to Stand test from chair height without UE support in equal or less than 14 seconds to improve B LE power and strength for transfers and improved mobility, and demonstrate decreased fall risk (threshold between 12 and 15 for increased fall risk).    Baseline 28 seconds with great difficulty from 18.5 inch plinth with B UE support on mat and knees. Very unstable (04/03/2020); 18 seconds with moderate difficulty from 18.5 inch plinth with B UE support on mat. Mildly unstable. (05/07/2020); 18 seconds from same surface with similar limitations (06/26/2020); 27 seconds with moderate difficulty from 18.5 inch plinth with B UE support on mat. Mildly unstable and did not stand up fully last rep. Limited by left glute pain (10/02/2020); 14.28 seconds with B UE support from 18.5 inch plinth with B UE support on mat (10/31/2020); 13.9 seconds with B UE support from 18.5 inch plinth with B UE support on mat. Mildly unstable and did not stand up fully 1st rep. Complains of right knee pain but demonstrates improved confidence (12/18/2020);    Time 12    Period Weeks    Status Partially Met      PT LONG TERM GOAL #6   Title Pateint will improve 6 Minute Walk Test distance to equal or greater than 1000 feet with LRAD to demonstrate improved  activity tolerance and endurance for community mobility and participation.    Baseline to be tested visit 2 (04/04/2019); 475 feet with SPC with SBA. (04/16/2020);  562 feet with SPC and CGA (05/07/2020); 454 feet with SPC and CGA and at least two stumbles (06/26/2020); 605 feet with SPC in left UE and one stumble (10/02/2020); 680 feet with SPC in left UE, no AFO. CGA-minA to prevent falls from several stumbles when pt caught R toe on floor. (10/31/2020); 656 feet with SPC in R UE, no AFO. CGA-minA to prevent falls from 3 stumbles when pt caught R toe on floor (12/18/2020);    Time 12    Period Weeks    Status Partially Met           -Pt seen for aquatic therapy today.  Treatment took place in water 3.5-4 ft 8 in depth at the [facility] pool. Temp of water was 94.  Pt entered/exited the pool via stairs with step to gait with bilat rails   -Reviewed response to prior Rx, current function, HEP compliance, and pain level.       Warm up -Pt ambulated 6 widths without noodle for support forward back and side stepping   Seated Stretching: gastroc, hamstring and adductors  3 x 20 sec hold Flutter kicking  3 trials of 1 min with vc and TC   Standing -Marching supported unsupported -Hip hiking 2 x 10 using kick board -Step ups submerged to chest x 10 R/L -Side stepping step ups R/L x 10.  Cues for placement of le/execution ue support on wall - hip extension 2 x 10 - Hip abduction 2 x 10 reps    UE with single foam hand buoy Shld flex x 15; add/abd x 15, horizontal abd/add x 15       Pt requires buoyancy for support and to offload joints with strengthening exercises. Viscosity of the water is needed for resistance of strengthening; water current perturbations provides challenge to standing balance unsupported, requiring increased core activation        Plan - 12/23/20 1329     Clinical Impression Statement Pt was able to complete prescribed exercises today with improved activity  tolerance and no adverse effect.  She completes activities without discomfort in knees submerged to chest.  Improvement in balance as pt able to amb without use support of noodle. Pt with questions in regards to TKR's and rehab.    Stability/Clinical Decision Making Evolving/Moderate complexity    Clinical Decision Making Moderate    Rehab Potential Fair    PT Frequency 2x / week    PT Duration 12 weeks    PT Treatment/Interventions ADLs/Self Care Home Management;Aquatic Therapy;Cryotherapy;Moist Heat;Functional mobility training;Stair training;Gait training;DME Instruction;Therapeutic activities;Therapeutic exercise;Balance training;Orthotic Fit/Training;Neuromuscular re-education;Patient/family education;Manual techniques;Passive range of motion;Dry needling;Electrical Stimulation;Energy conservation;Joint Manipulations;Spinal Manipulations;Taping;Splinting    PT Next Visit Plan strengthening/balance exercises, aquatic therapy    PT Home Exercise Plan [id number]             Patient will benefit from skilled therapeutic intervention in order to improve the following deficits and impairments:  Abnormal gait, Decreased knowledge of use of DME, Impaired sensation, Improper body mechanics, Pain, Decreased coordination, Decreased mobility, Impaired tone, Postural dysfunction, Decreased activity tolerance, Decreased endurance, Decreased range of motion, Decreased strength, Impaired perceived functional ability, Difficulty walking, Decreased balance  Visit Diagnosis: Difficulty in walking, not elsewhere classified  Muscle weakness (generalized)  Other symptoms and signs involving the musculoskeletal system  Bilateral low back pain without sciatica, unspecified chronicity  Right knee pain, unspecified chronicity  Pain in right leg  Pain in left leg  Unsteadiness on feet  Left knee pain, unspecified  chronicity  Joint stiffness of spine  Decreased ROM of lumbar spine     Problem  List Patient Active Problem List   Diagnosis Date Noted   Chronic back pain 08/07/2019   Depression 08/07/2019   Near syncope 08/06/2019   Sinus bradycardia 08/06/2019   Weakness 11/03/2012    Annamarie Major) Dasani Crear MPT  12/23/2020, 1:44 PM  [facility] [place], [place], [address] Phone: [phone]   Fax:  [phone]  Name: [name] MRN: [id number] Date of Birth: Oct 15, 1945

## 2020-12-24 DIAGNOSIS — R531 Weakness: Secondary | ICD-10-CM | POA: Diagnosis not present

## 2020-12-25 ENCOUNTER — Ambulatory Visit (HOSPITAL_BASED_OUTPATIENT_CLINIC_OR_DEPARTMENT_OTHER): Payer: Medicare Other | Admitting: Physical Therapy

## 2020-12-30 ENCOUNTER — Ambulatory Visit (HOSPITAL_BASED_OUTPATIENT_CLINIC_OR_DEPARTMENT_OTHER): Payer: Medicare Other | Admitting: Physical Therapy

## 2020-12-30 ENCOUNTER — Ambulatory Visit: Payer: Medicare Other | Attending: Physician Assistant | Admitting: Physical Therapy

## 2020-12-30 ENCOUNTER — Encounter: Payer: Self-pay | Admitting: Physical Therapy

## 2020-12-30 DIAGNOSIS — M79605 Pain in left leg: Secondary | ICD-10-CM | POA: Diagnosis not present

## 2020-12-30 DIAGNOSIS — R29898 Other symptoms and signs involving the musculoskeletal system: Secondary | ICD-10-CM | POA: Insufficient documentation

## 2020-12-30 DIAGNOSIS — M6281 Muscle weakness (generalized): Secondary | ICD-10-CM | POA: Diagnosis not present

## 2020-12-30 DIAGNOSIS — M79604 Pain in right leg: Secondary | ICD-10-CM | POA: Insufficient documentation

## 2020-12-30 DIAGNOSIS — R262 Difficulty in walking, not elsewhere classified: Secondary | ICD-10-CM | POA: Diagnosis not present

## 2020-12-30 DIAGNOSIS — M545 Low back pain, unspecified: Secondary | ICD-10-CM | POA: Diagnosis not present

## 2020-12-30 DIAGNOSIS — M25561 Pain in right knee: Secondary | ICD-10-CM | POA: Insufficient documentation

## 2020-12-30 NOTE — Therapy (Signed)
[facility] [address], [place], [address] Phone: [phone]   Fax:  [phone]  Physical Therapy Treatment  Patient Details  Name: GARIELLE MROZ MRN: [id number] Date of Birth: May 07, 1945 Referring Provider (PT): Erline Hau, [place]   Encounter Date: 12/30/2020   PT End of Session - 12/30/20 1609     Visit Number 39    Number of Visits 48    Date for PT Re-Evaluation 03/12/21    Authorization Type Medicare reporting period from 10/31/2020    Progress Note Due on Visit 40    PT Start Time 1603    PT Stop Time 1643    PT Time Calculation (min) 40 min    Equipment Utilized During Treatment Gait belt    Activity Tolerance Patient tolerated treatment well;No increased pain    Behavior During Therapy WFL for tasks assessed/performed             Past Medical History:  Diagnosis Date   Anxiety    Arthritis    Bilateral chronic knee pain    Chronic back pain    Depression    GERD (gastroesophageal reflux disease)    Insomnia    Memory changes    Sinus bradycardia    Urge incontinence     Past Surgical History:  Procedure Laterality Date   ABDOMINAL HYSTERECTOMY  1995   back sugery     lumbar   BIOPSY THYROID     benign goiter   BUNIONECTOMY  2013   rt foot   COLONOSCOPY     MUSCLE BIOPSY Left 10/03/2012   Procedure: LEFT QUADRICEP MUSCLE BIOPSY;  Surgeon: Odis Hollingshead, MD;  Location: [facility];  Service: General;  Laterality: Left;   NECK SURGERY  2010   cerv disc fused     There were no vitals filed for this visit.   Subjective Assessment - 12/30/20 1605     Subjective Patient reports her knees have been giving her some problems. She states her husband has been sick for about 3 weeks and she has been on her feet more. She would like to hold off on aquatic therapy for now because it is hard for her to get there, walk that far, and is concerned she may get a cold  and bring it home to her husband if she goes to the pool when it is cold. States she is able to continue PT on land once a week at this time. States knee pain is 4-5/10 on both knees. States her husband is staying in the bed sleeping most of the time. Arrives with R AFO donned and SPC.    Pertinent History Patient is a 75 y.o. female who presents to outpatient physical therapy with a referral for medical diagnosis thoracic spine pain, lumbar spine fusion, generalized weakness. This patient's chief complaints consist of weakness and difficulty moving R leg leading to the following functional deficits: increased difficulty with daily tasks and mobility including walking, stairs, getting in and out of the car, grocery shopping, general mobility, fear of falling.. She ambulates with a single point cane and carbon fiber AFO on the R ankle.  Relevant past medical history and comorbidities include anxiety, bilateral chronic knee pain, arthritis, chronic back pain, GERD, memory changes, sinus bradycardia, urge incontinence, R foot bunionectomy (2013), former smoker.   Patient denies hx of cancer, stroke, seizures, lung problem, major cardiac events, diabetes, unexplained weight loss, changes in  bowel or bladder problems, new onset stumbling or dropping things.    Limitations Lifting;Standing;Walking;House hold activities    Diagnostic tests Chest/Lumbar CT report from 01/14/2020: " IMPRESSION:  1. No CT evidence for acute thoracic, abdominal or pelvic injury.  2. No acute fracture involving the thoracic or lumbar spine.  3. Chronic findings as detailed above."    Patient Stated Goals "to get as much out of it as I can to move better"    Currently in Pain? Yes    Pain Score 5     Pain Location Knee    Pain Orientation Right;Left;Anterior    Pain Onset More than a month ago             OBJECTIVE  Patient used SPC in left UE, R AFO donned   Therapeutic exercise: to centralize symptoms and improve ROM,  strength, muscular endurance, and activity tolerance required for successful completion of functional activities.  - alternating foot taps on 6 inch step with U UE support, 3x10 each side, CGA for safety.  - seated long arc quad, 3x20 each side, R 5# AW, L 20# AW. 1 second holds - standing hip diagonal abduction/extension sliding foot on furniture slider with BUE support, 2x20 each side, YTB loop around ankles 2nd set. Verbal cuing to stand up tall for decreased compensation and tactile cuing to keep hips still.  - TRX sit <> stand to green chair, 2x10 with CGA for safety. Cuing to keep knees apart to improve LE alignment, cuing to control descent but continues to have poor eccentric control..  - ambulation around clinic for distance in 6 minutes with SPC in LE UE (most of the time) and R AFO. SBA for safety. 600 feet.  - ambulation ~ 100 feet to vehicle with CGA using SPC.   Pt required multimodal cuing for proper technique and to facilitate improved neuromuscular control, strength, range of motion, and functional ability resulting in improved performance and form. Required close supervision to minA  for safety during standing activities with occasional unsteadiness or stumble that patient was able to recover from with step strategy or by grabbing a nearby wall/object.     PT Education - 12/30/20 1609     Education Details exercise purpose/form.    Person(s) Educated Patient    Methods Explanation;Demonstration;Tactile cues;Verbal cues    Comprehension Verbalized understanding;Returned demonstration;Verbal cues required;Tactile cues required;Need further instruction              PT Short Term Goals - 10/02/20 1409       PT SHORT TERM GOAL #1   Title Be independent with initial home exercise program for self-management of symptoms.    Baseline To be initiated at visit 2 as appropriate (04/03/2020); provided but not participating (04/24/2020); participating in ambulation and sit <> stand  (05/07/2020; 06/26/2020); participating in ambulation practice at home (10/02/2020);    Time 2    Period Weeks    Status Partially Met    Target Date 04/17/20               PT Long Term Goals - 12/18/20 1542       PT LONG TERM GOAL #1   Title Be independent with a long-term home exercise program for self-management of symptoms.    Baseline to be updated at visit 2 as appropriate (04/03/2020); partially participating (05/07/2020; 06/26/2020; 10/02/2020); working on walking at home and climbing steps at home sit <> stands participating regularly (10/31/2020); continues to walk regularly (12/18/2020);  Time 12    Period Weeks    Status Partially Met   TARGET DATE FOR ALL LONG TERM GOALS: 06/26/2020. TARGET DATE FOR UNMET GOALS UPDATED TO 09/18/2020. UPDATED TO 12/25/2020. UPDATED TO 03/12/2021     PT LONG TERM GOAL #2   Title Demonstrate improved FOTO score to equal or greater than 56 by visit #20 demonstrate improvement in overall condition and self-reported functional ability.    Baseline 48 (04/03/2020); 45 (05/07/2020); 45 (06/26/2020); 46 (10/02/20); 59 (10/31/2020); 45 (12/18/2020);    Time 12    Period Weeks    Status Partially Met      PT LONG TERM GOAL #3   Title Patient will improve her 10 MWT speed with her SPC to at least 1.2 m/s to promote better community ambulation and safe crossing of streets.    Baseline to be measured visit 2 (04/03/2020); 0.54 meters/second with SPC and R AFO (04/16/2020): 0.6 meters/second with SPC (05/07/2020);  0.55 meters/second with SPC (06/26/2020); 0.49 m/sec with SPC and AFO (10/02/2020); 0.64 m/s with SPC in right UE, CGA  for safety (10/31/2020); 0.67 m/s with SPC in right UE, no AFO, CGA  for safety (no stumbles) (12/18/2020);    Time 12    Period Weeks    Status Partially Met      PT LONG TERM GOAL #4   Title Patient will improve ABC score by 13 percentage points to demonstrate improved self-reported balance.    Baseline 23.8% (04/03/2020); 32.5% (05/07/2020);  26.3% (06/26/2020); 23.8% (10/02/2020); 35% (10/31/2020); 38.1% (12/18/2020);    Time 12    Period Weeks    Status Partially Met      PT LONG TERM GOAL #5   Title Patient will complete 5 Times Sit to Stand test from chair height without UE support in equal or less than 14 seconds to improve B LE power and strength for transfers and improved mobility, and demonstrate decreased fall risk (threshold between 12 and 15 for increased fall risk).    Baseline 28 seconds with great difficulty from 18.5 inch plinth with B UE support on mat and knees. Very unstable (04/03/2020); 18 seconds with moderate difficulty from 18.5 inch plinth with B UE support on mat. Mildly unstable. (05/07/2020); 18 seconds from same surface with similar limitations (06/26/2020); 27 seconds with moderate difficulty from 18.5 inch plinth with B UE support on mat. Mildly unstable and did not stand up fully last rep. Limited by left glute pain (10/02/2020); 14.28 seconds with B UE support from 18.5 inch plinth with B UE support on mat (10/31/2020); 13.9 seconds with B UE support from 18.5 inch plinth with B UE support on mat. Mildly unstable and did not stand up fully 1st rep. Complains of right knee pain but demonstrates improved confidence (12/18/2020);    Time 12    Period Weeks    Status Partially Met      PT LONG TERM GOAL #6   Title Pateint will improve 6 Minute Walk Test distance to equal or greater than 1000 feet with LRAD to demonstrate improved activity tolerance and endurance for community mobility and participation.    Baseline to be tested visit 2 (04/04/2019); 475 feet with SPC with SBA. (04/16/2020); 562 feet with SPC and CGA (05/07/2020); 454 feet with SPC and CGA and at least two stumbles (06/26/2020); 605 feet with SPC in left UE and one stumble (10/02/2020); 680 feet with SPC in left UE, no AFO. CGA-minA to prevent falls from several stumbles when pt  caught R toe on floor. (10/31/2020); 656 feet with SPC in R UE, no AFO. CGA-minA to  prevent falls from 3 stumbles when pt caught R toe on floor (12/18/2020);    Time 12    Period Weeks    Status Partially Met                   Plan - 12/30/20 1644     Clinical Impression Statement ?Patient very fatigued by end of session. Will continue with land PT over the next few weeks. Patient continues to have difficulty with functional mobility and requires skilled intervention to maximize function. Patient would benefit from continued management of limiting condition by skilled physical therapist to address remaining impairments and functional limitations to work towards stated goals and return to PLOF or maximal functional independence.    Personal Factors and Comorbidities Age;Comorbidity 3+;Education;Past/Current Experience;Fitness;Time since onset of injury/illness/exacerbation;Social Background    Comorbidities Relevant past medical history and comorbidities include anxiety, bilateral chronic knee pain, arthritis, chronic back pain, GERD, memory changes, sinus bradycardia, urge incontinence, R foot bunionectomy (2013), former smoker.    Examination-Activity Limitations Bed Mobility;Lift;Squat;Locomotion Level;Stand;Caring for Others;Carry;Transfers;Dressing    Examination-Participation Restrictions Laundry;Church;Cleaning;Community Activity;Meal Prep;Driving;Yard Work    [employer] Evolving/Moderate complexity    Rehab Potential Fair    PT Frequency 2x / week    PT Duration 12 weeks    PT Treatment/Interventions ADLs/Self Care Home Management;Aquatic Therapy;Cryotherapy;Moist Heat;Functional mobility training;Stair training;Gait training;DME Instruction;Therapeutic activities;Therapeutic exercise;Balance training;Orthotic Fit/Training;Neuromuscular re-education;Patient/family education;Manual techniques;Passive range of motion;Dry needling;Electrical Stimulation;Energy conservation;Joint Manipulations;Spinal Manipulations;Taping;Splinting    PT Next  Visit Plan strengthening/balance exercises, aquatic therapy    PT Home Exercise Plan [id number]    Consulted and Agree with Plan of Care Patient             Patient will benefit from skilled therapeutic intervention in order to improve the following deficits and impairments:  Abnormal gait, Decreased knowledge of use of DME, Impaired sensation, Improper body mechanics, Pain, Decreased coordination, Decreased mobility, Impaired tone, Postural dysfunction, Decreased activity tolerance, Decreased endurance, Decreased range of motion, Decreased strength, Impaired perceived functional ability, Difficulty walking, Decreased balance  Visit Diagnosis: Difficulty in walking, not elsewhere classified  Muscle weakness (generalized)  Other symptoms and signs involving the musculoskeletal system  Bilateral low back pain without sciatica, unspecified chronicity  Right knee pain, unspecified chronicity  Pain in right leg  Pain in left leg     Problem List Patient Active Problem List   Diagnosis Date Noted   Chronic back pain 08/07/2019   Depression 08/07/2019   Near syncope 08/06/2019   Sinus bradycardia 08/06/2019   Weakness 11/03/2012    Everlean Alstrom. Graylon Good, PT, DPT 12/30/20, 4:45 PM  [facility] [address] [place], [address] Phone: [phone]   Fax:  [phone]  Name: BLAIZE NIPPER MRN: [id number] Date of Birth: 13-Dec-1945

## 2021-01-01 ENCOUNTER — Ambulatory Visit (HOSPITAL_BASED_OUTPATIENT_CLINIC_OR_DEPARTMENT_OTHER): Payer: Medicare Other | Admitting: Physical Therapy

## 2021-01-02 ENCOUNTER — Encounter: Payer: Medicare Other | Admitting: Physical Therapy

## 2021-01-06 ENCOUNTER — Ambulatory Visit (HOSPITAL_BASED_OUTPATIENT_CLINIC_OR_DEPARTMENT_OTHER): Payer: Medicare Other | Admitting: Physical Therapy

## 2021-01-06 ENCOUNTER — Encounter: Payer: Medicare Other | Admitting: Physical Therapy

## 2021-01-07 ENCOUNTER — Ambulatory Visit: Payer: Medicare Other | Admitting: Physical Therapy

## 2021-01-08 ENCOUNTER — Ambulatory Visit (HOSPITAL_BASED_OUTPATIENT_CLINIC_OR_DEPARTMENT_OTHER): Payer: Medicare Other | Admitting: Physical Therapy

## 2021-01-09 ENCOUNTER — Ambulatory Visit (HOSPITAL_BASED_OUTPATIENT_CLINIC_OR_DEPARTMENT_OTHER): Payer: Medicare Other | Admitting: Physical Therapy

## 2021-01-13 ENCOUNTER — Ambulatory Visit (HOSPITAL_BASED_OUTPATIENT_CLINIC_OR_DEPARTMENT_OTHER): Payer: Medicare Other | Admitting: Physical Therapy

## 2021-01-14 ENCOUNTER — Ambulatory Visit (HOSPITAL_BASED_OUTPATIENT_CLINIC_OR_DEPARTMENT_OTHER): Payer: Medicare Other | Admitting: Physical Therapy

## 2021-01-16 ENCOUNTER — Ambulatory Visit (HOSPITAL_BASED_OUTPATIENT_CLINIC_OR_DEPARTMENT_OTHER): Payer: Medicare Other | Admitting: Physical Therapy

## 2021-01-16 ENCOUNTER — Ambulatory Visit: Payer: Medicare Other | Admitting: Physical Therapy

## 2021-01-20 ENCOUNTER — Ambulatory Visit (HOSPITAL_BASED_OUTPATIENT_CLINIC_OR_DEPARTMENT_OTHER): Payer: Medicare Other | Admitting: Physical Therapy

## 2021-01-20 DIAGNOSIS — H11823 Conjunctivochalasis, bilateral: Secondary | ICD-10-CM | POA: Diagnosis not present

## 2021-01-20 DIAGNOSIS — H02832 Dermatochalasis of right lower eyelid: Secondary | ICD-10-CM | POA: Diagnosis not present

## 2021-01-20 DIAGNOSIS — H5711 Ocular pain, right eye: Secondary | ICD-10-CM | POA: Diagnosis not present

## 2021-01-20 DIAGNOSIS — H04201 Unspecified epiphora, right lacrimal gland: Secondary | ICD-10-CM | POA: Diagnosis not present

## 2021-01-20 DIAGNOSIS — H02835 Dermatochalasis of left lower eyelid: Secondary | ICD-10-CM | POA: Diagnosis not present

## 2021-01-21 ENCOUNTER — Ambulatory Visit (HOSPITAL_BASED_OUTPATIENT_CLINIC_OR_DEPARTMENT_OTHER): Payer: Medicare Other | Admitting: Physical Therapy

## 2021-01-21 ENCOUNTER — Telehealth: Payer: Self-pay | Admitting: Physical Therapy

## 2021-01-21 NOTE — Telephone Encounter (Signed)
Called patient at her cell number to check on her and remind her of her appointment scheduled for 2:30pm tomorrow. Left message asking to call back.   Called home number, son Aaron Edelman answered and said she was not available. Left message with him that I was reminding her of her appointment tomorrow and checking if she is still able to come.   Everlean Alstrom. Graylon Good, PT, DPT 01/21/21, 2:06 PM

## 2021-01-22 ENCOUNTER — Ambulatory Visit (HOSPITAL_BASED_OUTPATIENT_CLINIC_OR_DEPARTMENT_OTHER): Payer: Medicare Other | Admitting: Physical Therapy

## 2021-01-22 ENCOUNTER — Telehealth: Payer: Self-pay | Admitting: Physical Therapy

## 2021-01-22 ENCOUNTER — Ambulatory Visit: Payer: Medicare Other | Admitting: Physical Therapy

## 2021-01-22 NOTE — Telephone Encounter (Signed)
Called patient when she did not come to her appointment today scheduled for 2:30pm. Patient answered and said her husband is in the ICU and she has been too busy to call us and has been meaning to. She agreed to cancel her future appointments at this time and call back when she is ready to resume PT.   Everlean Alstrom. Graylon Good, PT, DPT 01/22/21, 2:58 PM

## 2021-01-23 ENCOUNTER — Encounter: Payer: Medicare Other | Admitting: Physical Therapy

## 2021-01-27 ENCOUNTER — Ambulatory Visit (HOSPITAL_BASED_OUTPATIENT_CLINIC_OR_DEPARTMENT_OTHER): Payer: Medicare Other | Admitting: Physical Therapy

## 2021-01-29 ENCOUNTER — Encounter: Payer: Medicare Other | Admitting: Physical Therapy

## 2021-02-03 ENCOUNTER — Encounter: Payer: Medicare Other | Admitting: Physical Therapy

## 2021-02-11 ENCOUNTER — Encounter: Payer: Medicare Other | Admitting: Physical Therapy

## 2021-02-18 ENCOUNTER — Encounter: Payer: Medicare Other | Admitting: Physical Therapy

## 2021-03-04 ENCOUNTER — Encounter: Payer: Self-pay | Admitting: Physical Therapy

## 2021-03-04 ENCOUNTER — Ambulatory Visit: Payer: Medicare Other | Attending: Physician Assistant | Admitting: Physical Therapy

## 2021-03-04 DIAGNOSIS — M545 Low back pain, unspecified: Secondary | ICD-10-CM | POA: Diagnosis not present

## 2021-03-04 DIAGNOSIS — M6281 Muscle weakness (generalized): Secondary | ICD-10-CM | POA: Insufficient documentation

## 2021-03-04 DIAGNOSIS — R262 Difficulty in walking, not elsewhere classified: Secondary | ICD-10-CM | POA: Insufficient documentation

## 2021-03-04 DIAGNOSIS — M25562 Pain in left knee: Secondary | ICD-10-CM | POA: Diagnosis not present

## 2021-03-04 DIAGNOSIS — M25561 Pain in right knee: Secondary | ICD-10-CM | POA: Insufficient documentation

## 2021-03-04 DIAGNOSIS — R29898 Other symptoms and signs involving the musculoskeletal system: Secondary | ICD-10-CM | POA: Diagnosis not present

## 2021-03-04 DIAGNOSIS — R2681 Unsteadiness on feet: Secondary | ICD-10-CM | POA: Insufficient documentation

## 2021-03-04 NOTE — Therapy (Signed)
[facility] [address] [place], [address] Phone: [phone]   Fax:  [phone]  Physical Therapy Treatment / Progress Note / Re-Certification Dates of reporting from 10/31/2020 to 03/04/2021  Patient Details  Name: [name] MRN: [id number] Date of Birth: [birth year] Referring Provider (PT): Erline Hau, [place]   Encounter Date: 03/04/2021   PT End of Session - 03/04/21 1137     Visit Number 40    Number of Visits 71    Date for PT Re-Evaluation 05/27/21    Authorization Type Medicare reporting period from 10/31/2020    Progress Note Due on Visit 40    PT Start Time 1125    PT Stop Time 1200    PT Time Calculation (min) 35 min    Equipment Utilized During Treatment Gait belt    Activity Tolerance No increased pain;Patient limited by fatigue    Behavior During Therapy [facility] for tasks assessed/performed             Past Medical History:  Diagnosis Date   Anxiety    Arthritis    Bilateral chronic knee pain    Chronic back pain    Depression    GERD (gastroesophageal reflux disease)    Insomnia    Memory changes    Sinus bradycardia    Urge incontinence     Past Surgical History:  Procedure Laterality Date   ABDOMINAL HYSTERECTOMY  1995   back sugery     lumbar   BIOPSY THYROID     benign goiter   BUNIONECTOMY  2013   rt foot   COLONOSCOPY     MUSCLE BIOPSY Left 10/03/2012   Procedure: LEFT QUADRICEP MUSCLE BIOPSY;  Surgeon: Odis Hollingshead, MD;  Location: [facility];  Service: General;  Laterality: Left;   NECK SURGERY  2010   cerv disc fused     There were no vitals filed for this visit.   Subjective Assessment - 03/04/21 1126     Subjective Patient reports she is "doing the best she can." She is returning to PT today after about 2 months away due to her husband being hospitalized with CHF and some other problems. He just came home a couple of days ago. She  states her mobility has been about the same and she has no pain upon arrival. She arrives on [facility] wiht R AFO donned. She has been doing a lot of walking back and forth to hospitals and is exhausted due to that. Her kids are helping her some at home curently. She reports no falls since last PT session.    Pertinent History Patient is a 75 y.o. female who presents to outpatient physical therapy with a referral for medical diagnosis thoracic spine pain, lumbar spine fusion, generalized weakness. This patient's chief complaints consist of weakness and difficulty moving R leg leading to the following functional deficits: increased difficulty with daily tasks and mobility including walking, stairs, getting in and out of the car, grocery shopping, general mobility, fear of falling.. She ambulates with a single point cane and carbon fiber AFO on the R ankle.  Relevant past medical history and comorbidities include anxiety, bilateral chronic knee pain, arthritis, chronic back pain, GERD, memory changes, sinus bradycardia, urge incontinence, R foot bunionectomy (2013), former smoker.   Patient denies hx of cancer, stroke, seizures, lung problem, major cardiac events, diabetes, unexplained weight loss, changes in bowel or bladder problems, new onset stumbling  or dropping things.    Limitations Lifting;Standing;Walking;House hold activities    Diagnostic tests Chest/Lumbar CT report from 01/14/2020: " IMPRESSION:  1. No CT evidence for acute thoracic, abdominal or pelvic injury.  2. No acute fracture involving the thoracic or lumbar spine.  3. Chronic findings as detailed above."    Patient Stated Goals "to get as much out of it as I can to move better"    Currently in Pain? No/denies    Pain Onset More than a month ago              OBJECTIVE  SELF-REPORTED FUNCTION FOTO score: 40/100 (NOC-Neuromuscular disorder questionnaire) ABC scale = 33.1%   FUNCTIONAL/BALANCE TESTS 6MWT: 691 feet with SPC in  alternating UE. CGA-minA to prevent falls from 1 stumbles when pt caught R toe on floor.  HR 74 bpm, SpO2 99% following.  10MWT: 0.59 m/s with SPC in left UE, CGA  for safety (no stumbles)  Five Time Sit to Stand (5TSTS): 15.78 seconds from 18.5 inch plinth with B UE support on mat. Mildly unstable and did not stand up fully 1st rep. States "I'm so tired I cannot tell you" when asked how it felt.    Patient used SPC in alternating UE, R AFO donned   Therapeutic exercise: to centralize symptoms and improve ROM, strength, muscular endurance, and activity tolerance required for successful completion of functional activities.  - ambulation around clinic for distance in 6 minutes with SPC in LE UE and no AFO. CGA-minA to prevent falls from several stumbles when pt caught R toe on floor. - ambulation with SPC 2x30 feet for speed - sit <> stand from 18.5 inch plinth with BUE support and SBA 1x5 for speed.  - alternating foot taps on 6 inch step with U UE support (on TM), 3x10 each side, CGA for safety.  - ambulation ~ 100 feet to vehicle with CGA using SPC.   Pt required multimodal cuing for proper technique and to facilitate improved neuromuscular control, strength, range of motion, and functional ability resulting in improved performance and form. Required close supervision to minA  for safety during standing activities with occasional unsteadiness or stumble that patient was able to recover from with step strategy or by grabbing a nearby wall/object.      PT Education - 03/05/21 1014     Education Details exercise purpose/form. POC, progress    Person(s) Educated Patient    Methods Explanation;Demonstration;Verbal cues;Tactile cues    Comprehension Verbalized understanding;Verbal cues required;Tactile cues required;Returned demonstration;Need further instruction              PT Short Term Goals - 03/05/21 1002       PT SHORT TERM GOAL #1   Title Be independent with initial home exercise  program for self-management of symptoms.    Baseline To be initiated at visit 2 as appropriate (04/03/2020); provided but not participating (04/24/2020); participating in ambulation and sit <> stand (05/07/2020; 06/26/2020); participating in ambulation practice at home (10/02/2020); states she has been doing a lot of walking to/from/around hospitals (03/05/2021);    Time 2    Period Weeks    Status Partially Met    Target Date 04/17/20               PT Long Term Goals - 03/05/21 1002       PT LONG TERM GOAL #1   Title Be independent with a long-term home exercise program for self-management of symptoms.  Baseline to be updated at visit 2 as appropriate (04/03/2020); partially participating (05/07/2020; 06/26/2020; 10/02/2020); working on walking at home and climbing steps at home sit <> stands participating regularly (10/31/2020); continues to walk regularly (12/18/2020); states she has been doing a lot of walking to/from/around hospitals (03/04/2021);    Time 12    Period Weeks    Status On-going   TARGET DATE FOR ALL LONG TERM GOALS: 06/26/2020. TARGET DATE FOR UNMET GOALS UPDATED TO 09/18/2020. UPDATED TO 12/25/2020. UPDATED TO 03/12/2021. UPDATED TO 05/27/2021.   Target Date 05/27/21      PT LONG TERM GOAL #2   Title Demonstrate improved FOTO score to equal or greater than 56 by visit #20 demonstrate improvement in overall condition and self-reported functional ability.    Baseline 48 (04/03/2020); 45 (05/07/2020); 45 (06/26/2020); 46 (10/02/20); 59 (10/31/2020); 45 (12/18/2020); 40 (03/04/2021);    Time 12    Period Weeks    Status On-going      PT LONG TERM GOAL #3   Title Patient will improve her 10 MWT speed with her SPC to at least 1.2 m/s to promote better community ambulation and safe crossing of streets.    Baseline to be measured visit 2 (04/03/2020); 0.54 meters/second with SPC and R AFO (04/16/2020): 0.6 meters/second with SPC (05/07/2020);  0.55 meters/second with SPC (06/26/2020); 0.49 m/sec  with SPC and AFO (10/02/2020); 0.64 m/s with SPC in right UE, CGA  for safety (10/31/2020); 0.67 m/s with SPC in right UE, no AFO, CGA  for safety (no stumbles) (12/18/2020); 0.59 m/s with SPC in left UE, CGA  for safety (03/04/2021);    Time 12    Period Weeks    Status On-going      PT LONG TERM GOAL #4   Title Patient will improve ABC score by 13 percentage points to demonstrate improved self-reported balance.    Baseline 23.8% (04/03/2020); 32.5% (05/07/2020); 26.3% (06/26/2020); 23.8% (10/02/2020); 35% (10/31/2020); 38.1% (12/18/2020); 33.1% (03/04/2021);    Time 12    Period Weeks    Status On-going      PT LONG TERM GOAL #5   Title Patient will complete 5 Times Sit to Stand test from chair height without UE support in equal or less than 14 seconds to improve B LE power and strength for transfers and improved mobility, and demonstrate decreased fall risk (threshold between 12 and 15 for increased fall risk).    Baseline 28 seconds with great difficulty from 18.5 inch plinth with B UE support on mat and knees. Very unstable (04/03/2020); 18 seconds with moderate difficulty from 18.5 inch plinth with B UE support on mat. Mildly unstable. (05/07/2020); 18 seconds from same surface with similar limitations (06/26/2020); 27 seconds with moderate difficulty from 18.5 inch plinth with B UE support on mat. Mildly unstable and did not stand up fully last rep. Limited by left glute pain (10/02/2020); 14.28 seconds with B UE support from 18.5 inch plinth with B UE support on mat (10/31/2020); 13.9 seconds with B UE support from 18.5 inch plinth with B UE support on mat. Mildly unstable and did not stand up fully 1st rep. Complains of right knee pain but demonstrates improved confidence (12/18/2020); 15.78 seconds from 18.5 inch plinth with B UE support on mat. Mildly unstable and did not stand up fully 1st rep (03/04/2021);    Time 12    Period Weeks    Status Partially Met      PT LONG TERM GOAL #6  Title Pateint  will improve 6 Minute Walk Test distance to equal or greater than 1000 feet with LRAD to demonstrate improved activity tolerance and endurance for community mobility and participation.    Baseline to be tested visit 2 (04/04/2019); 475 feet with SPC with SBA. (04/16/2020); 562 feet with SPC and CGA (05/07/2020); 454 feet with SPC and CGA and at least two stumbles (06/26/2020); 605 feet with SPC in left UE and one stumble (10/02/2020); 680 feet with SPC in left UE, no AFO. CGA-minA to prevent falls from several stumbles when pt caught R toe on floor. (10/31/2020); 656 feet with SPC in R UE, no AFO. CGA-minA to prevent falls from 3 stumbles when pt caught R toe on floor (12/18/2020); 691 feet with SPC in alternating UE. CGA-minA to prevent falls from 1 stumbles when pt caught R toe on floor (03/04/2021);    Time 12    Period Weeks    Status Partially Met                   Plan - 03/05/21 1014     Clinical Impression Statement Patient arrived 8 min late for her appointment. Patient has attended 40 physical therapy sessions this episode of care. She is returning to PT today after being away for ~ 2 months while her husband was in the hospital. She continues to demonstrate improvements in most functional tests since initial eval but fluctuates slightly in her scores over time since starting PT. Since being away from PT for the last 2 months, her 5TSTS test and walking speed has worsened as has her FOTO (self-reported function) and ABC (confidence with balance) scores. She continues to report quick and chronic fatigue, although her heart rate and SpO2 remain WFL with exertion. 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 This also puts chronic abnormal force on her right knee where she suffers form pain and signs of arthritic  changes and dysfunction. Patient has been advised by her physician to continue PT regularly for the rest of her life to prevent loss of function. Physical therapy is medically neccessary slow decline in function and maximize functional independence and mobility, preventing or delaying need for higher level of care. 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 She demonstrates increased fall risk with frequent stumbles, difficulty with community walking distances (6MWT 691 feet), decreased balance and speed for functional transfers and LE strength (5TSTS 15.78 seconds with B UE support and unsteadiness), and walking speed less than required to safely cross a street during light change (pt demo 0.59 m/s, safe crossing speed 1.2 m/s). These measured demonstrate need for continued physical therapy. Patient would benefit from aquatic setting where buoyancy reduces pain of weight bearing during therapeutic interventions, but she does not feel comfortable going to the pool in the winter time. Patient would benefit from continued management of limiting condition by skilled physical therapist to address remaining impairments and functional limitations to work towards stated goals and return to PLOF or maximal functional independence.    Personal Factors and Comorbidities Age;Comorbidity 3+;Education;Past/Current Experience;Fitness;Time since onset of injury/illness/exacerbation;Social Background    Comorbidities Relevant past medical history and comorbidities include anxiety, bilateral chronic knee pain, arthritis, chronic back pain, GERD, memory changes, sinus bradycardia, urge incontinence, R foot bunionectomy (2013), former smoker.  Examination-Activity Limitations Bed Mobility;Lift;Squat;Locomotion Level;Stand;Caring for Others;Carry;Transfers;Dressing    Examination-Participation Restrictions Laundry;Church;Cleaning;Community Activity;Meal Prep;Driving;Yard Work    Multimedia programmer Evolving/Moderate complexity    Rehab Potential Fair    PT Frequency 2x / week    PT Duration 12 weeks    PT Treatment/Interventions ADLs/Self Care Home Management;Aquatic Therapy;Cryotherapy;Moist Heat;Functional mobility training;Stair training;Gait training;DME Instruction;Therapeutic activities;Therapeutic exercise;Balance training;Orthotic Fit/Training;Neuromuscular re-education;Patient/family education;Manual techniques;Passive range of motion;Dry needling;Electrical Stimulation;Energy conservation;Joint Manipulations;Spinal Manipulations;Taping;Splinting    PT Next Visit Plan strengthening/balance exercises, aquatic therapy    PT Home Exercise Plan Medbridge code [email or web address]    Consulted and Agree with Plan of Care Patient             Patient will benefit from skilled therapeutic intervention in order to improve the following deficits and impairments:  Abnormal gait, Decreased knowledge of use of DME, Impaired sensation, Improper body mechanics, Pain, Decreased coordination, Decreased mobility, Impaired tone, Postural dysfunction, Decreased activity tolerance, Decreased endurance, Decreased range of motion, Decreased strength, Impaired perceived functional ability, Difficulty walking, Decreased balance  Visit Diagnosis: Difficulty in walking, not elsewhere classified  Muscle weakness (generalized)  Other symptoms and signs involving the musculoskeletal system  Bilateral low back pain without sciatica, unspecified chronicity  Unsteadiness on feet  Right knee pain, unspecified chronicity  Left knee pain, unspecified chronicity     Problem List Patient Active Problem List   Diagnosis Date Noted   Chronic back pain 08/07/2019   Depression 08/07/2019   Near syncope 08/06/2019   Sinus bradycardia 08/06/2019   Weakness 11/03/2012    Everlean Alstrom. Graylon Good, PT, DPT 03/05/21, 10:18 AM   [facility] [address], [place], [address] Phone: [phone]   Fax:  [phone]  Name: [name] MRN: [id number] Date of Birth: December 26, 1945

## 2021-03-12 ENCOUNTER — Ambulatory Visit: Payer: Medicare Other | Admitting: Physical Therapy

## 2021-03-12 DIAGNOSIS — R262 Difficulty in walking, not elsewhere classified: Secondary | ICD-10-CM | POA: Diagnosis not present

## 2021-03-12 DIAGNOSIS — M25562 Pain in left knee: Secondary | ICD-10-CM

## 2021-03-12 DIAGNOSIS — M25561 Pain in right knee: Secondary | ICD-10-CM

## 2021-03-12 DIAGNOSIS — M6281 Muscle weakness (generalized): Secondary | ICD-10-CM

## 2021-03-12 DIAGNOSIS — R29898 Other symptoms and signs involving the musculoskeletal system: Secondary | ICD-10-CM | POA: Diagnosis not present

## 2021-03-12 DIAGNOSIS — R2681 Unsteadiness on feet: Secondary | ICD-10-CM | POA: Diagnosis not present

## 2021-03-12 DIAGNOSIS — M545 Low back pain, unspecified: Secondary | ICD-10-CM | POA: Diagnosis not present

## 2021-03-12 NOTE — Therapy (Signed)
[facility] [address], [place], [address] Phone: [phone]   Fax:  [phone]  Physical Therapy Treatment  Patient Details  Name: [name] MRN: [id number] Date of Birth: [birth year] Referring Provider (PT): Erline Hau, [place]   Encounter Date: 03/12/2021    Past Medical History:  Diagnosis Date   Anxiety    Arthritis    Bilateral chronic knee pain    Chronic back pain    Depression    GERD (gastroesophageal reflux disease)    Insomnia    Memory changes    Sinus bradycardia    Urge incontinence     Past Surgical History:  Procedure Laterality Date   ABDOMINAL HYSTERECTOMY  1995   back sugery     lumbar   BIOPSY THYROID     benign goiter   BUNIONECTOMY  2013   rt foot   COLONOSCOPY     MUSCLE BIOPSY Left 10/03/2012   Procedure: LEFT QUADRICEP MUSCLE BIOPSY;  Surgeon: Odis Hollingshead, MD;  Location: [facility];  Service: General;  Laterality: Left;   NECK SURGERY  2010   cerv disc fused     There were no vitals filed for this visit.                                 PT Short Term Goals - 03/05/21 1002       PT SHORT TERM GOAL #1   Title Be independent with initial home exercise program for self-management of symptoms.    Baseline To be initiated at visit 2 as appropriate (04/03/2020); provided but not participating (04/24/2020); participating in ambulation and sit <> stand (05/07/2020; 06/26/2020); participating in ambulation practice at home (10/02/2020); states she has been doing a lot of walking to/from/around hospitals (03/05/2021);    Time 2    Period Weeks    Status Partially Met    Target Date 04/17/20               PT Long Term Goals - 03/05/21 1002       PT LONG TERM GOAL #1   Title Be independent with a long-term home exercise program for self-management of symptoms.    Baseline to be updated at visit 2 as appropriate  (04/03/2020); partially participating (05/07/2020; 06/26/2020; 10/02/2020); working on walking at home and climbing steps at home sit <> stands participating regularly (10/31/2020); continues to walk regularly (12/18/2020); states she has been doing a lot of walking to/from/around hospitals (03/04/2021);    Time 12    Period Weeks    Status On-going   TARGET DATE FOR ALL LONG TERM GOALS: 06/26/2020. TARGET DATE FOR UNMET GOALS UPDATED TO 09/18/2020. UPDATED TO 12/25/2020. UPDATED TO 03/12/2021. UPDATED TO 05/27/2021.   Target Date 05/27/21      PT LONG TERM GOAL #2   Title Demonstrate improved FOTO score to equal or greater than 56 by visit #20 demonstrate improvement in overall condition and self-reported functional ability.    Baseline 48 (04/03/2020); 45 (05/07/2020); 45 (06/26/2020); 46 (10/02/20); 59 (10/31/2020); 45 (12/18/2020); 40 (03/04/2021);    Time 12    Period Weeks    Status On-going      PT LONG TERM GOAL #3   Title Patient will improve her 10 MWT speed with her SPC to at least 1.2 m/s to promote better community ambulation and safe crossing of  streets.    Baseline to be measured visit 2 (04/03/2020); 0.54 meters/second with SPC and R AFO (04/16/2020): 0.6 meters/second with SPC (05/07/2020);  0.55 meters/second with SPC (06/26/2020); 0.49 m/sec with SPC and AFO (10/02/2020); 0.64 m/s with SPC in right UE, CGA  for safety (10/31/2020); 0.67 m/s with SPC in right UE, no AFO, CGA  for safety (no stumbles) (12/18/2020); 0.59 m/s with SPC in left UE, CGA  for safety (03/04/2021);    Time 12    Period Weeks    Status On-going      PT LONG TERM GOAL #4   Title Patient will improve ABC score by 13 percentage points to demonstrate improved self-reported balance.    Baseline 23.8% (04/03/2020); 32.5% (05/07/2020); 26.3% (06/26/2020); 23.8% (10/02/2020); 35% (10/31/2020); 38.1% (12/18/2020); 33.1% (03/04/2021);    Time 12    Period Weeks    Status On-going      PT LONG TERM GOAL #5   Title Patient will complete 5  Times Sit to Stand test from chair height without UE support in equal or less than 14 seconds to improve B LE power and strength for transfers and improved mobility, and demonstrate decreased fall risk (threshold between 12 and 15 for increased fall risk).    Baseline 28 seconds with great difficulty from 18.5 inch plinth with B UE support on mat and knees. Very unstable (04/03/2020); 18 seconds with moderate difficulty from 18.5 inch plinth with B UE support on mat. Mildly unstable. (05/07/2020); 18 seconds from same surface with similar limitations (06/26/2020); 27 seconds with moderate difficulty from 18.5 inch plinth with B UE support on mat. Mildly unstable and did not stand up fully last rep. Limited by left glute pain (10/02/2020); 14.28 seconds with B UE support from 18.5 inch plinth with B UE support on mat (10/31/2020); 13.9 seconds with B UE support from 18.5 inch plinth with B UE support on mat. Mildly unstable and did not stand up fully 1st rep. Complains of right knee pain but demonstrates improved confidence (12/18/2020); 15.78 seconds from 18.5 inch plinth with B UE support on mat. Mildly unstable and did not stand up fully 1st rep (03/04/2021);    Time 12    Period Weeks    Status Partially Met      PT LONG TERM GOAL #6   Title Pateint will improve 6 Minute Walk Test distance to equal or greater than 1000 feet with LRAD to demonstrate improved activity tolerance and endurance for community mobility and participation.    Baseline to be tested visit 2 (04/04/2019); 475 feet with SPC with SBA. (04/16/2020); 562 feet with SPC and CGA (05/07/2020); 454 feet with SPC and CGA and at least two stumbles (06/26/2020); 605 feet with SPC in left UE and one stumble (10/02/2020); 680 feet with SPC in left UE, no AFO. CGA-minA to prevent falls from several stumbles when pt caught R toe on floor. (10/31/2020); 656 feet with SPC in R UE, no AFO. CGA-minA to prevent falls from 3 stumbles when pt caught R toe on floor  (12/18/2020); 691 feet with SPC in alternating UE. CGA-minA to prevent falls from 1 stumbles when pt caught R toe on floor (03/04/2021);    Time 12    Period Weeks    Status Partially Met                    Patient will benefit from skilled therapeutic intervention in order to improve the following deficits and impairments:  Visit Diagnosis: No diagnosis found.     Problem List Patient Active Problem List   Diagnosis Date Noted   Chronic back pain 08/07/2019   Depression 08/07/2019   Near syncope 08/06/2019   Sinus bradycardia 08/06/2019   Weakness 11/03/2012    Nancy Nordmann, PT 03/12/2021, 10:44 AM  [facility] [address] [place], [address] Phone: [phone]   Fax:  [phone]  Name: [name] MRN: [id number] Date of Birth: Mar 10, 1946

## 2021-03-12 NOTE — Therapy (Signed)
[facility] [address] [place], [address] Phone: [phone]   Fax:  [phone]  Physical Therapy Treatment  Patient Details  Name: [name] MRN: [id number] Date of Birth: Feb 19, 1946 Referring Provider (PT): Erline Hau, [place]   Encounter Date: 03/12/2021   PT End of Session - 03/12/21 1045     Visit Number 41    Number of Visits 89    Date for PT Re-Evaluation 05/27/21    Authorization Type Medicare reporting period from 10/31/2020    Progress Note Due on Visit 40    PT Start Time 1045    PT Stop Time 1115    PT Time Calculation (min) 30 min    Equipment Utilized During Treatment Gait belt    Activity Tolerance No increased pain;Patient limited by fatigue    Behavior During Therapy [facility] for tasks assessed/performed             Past Medical History:  Diagnosis Date   Anxiety    Arthritis    Bilateral chronic knee pain    Chronic back pain    Depression    GERD (gastroesophageal reflux disease)    Insomnia    Memory changes    Sinus bradycardia    Urge incontinence     Past Surgical History:  Procedure Laterality Date   ABDOMINAL HYSTERECTOMY  1995   back sugery     lumbar   BIOPSY THYROID     benign goiter   BUNIONECTOMY  2013   rt foot   COLONOSCOPY     MUSCLE BIOPSY Left 10/03/2012   Procedure: LEFT QUADRICEP MUSCLE BIOPSY;  Surgeon: Odis Hollingshead, MD;  Location: [facility];  Service: General;  Laterality: Left;   NECK SURGERY  2010   cerv disc fused     There were no vitals filed for this visit.   Subjective Assessment - 03/12/21 1045     Subjective Patient arrives with SPC and R AFO. No falls since last treatment session. She states her R knee contiues to hurt is normal amount which she rates as 5/10. She has been doing walking, sit<> stands, and trying to keep swelling down at home. She states she felt okay after last PT session.    Pertinent  History Patient is a 75 y.o. female who presents to outpatient physical therapy with a referral for medical diagnosis thoracic spine pain, lumbar spine fusion, generalized weakness. This patient's chief complaints consist of weakness and difficulty moving R leg leading to the following functional deficits: increased difficulty with daily tasks and mobility including walking, stairs, getting in and out of the car, grocery shopping, general mobility, fear of falling.. She ambulates with a single point cane and carbon fiber AFO on the R ankle.  Relevant past medical history and comorbidities include anxiety, bilateral chronic knee pain, arthritis, chronic back pain, GERD, memory changes, sinus bradycardia, urge incontinence, R foot bunionectomy (2013), former smoker.   Patient denies hx of cancer, stroke, seizures, lung problem, major cardiac events, diabetes, unexplained weight loss, changes in bowel or bladder problems, new onset stumbling or dropping things.    Limitations Lifting;Standing;Walking;House hold activities    Diagnostic tests Chest/Lumbar CT report from 01/14/2020: " IMPRESSION:  1. No CT evidence for acute thoracic, abdominal or pelvic injury.  2. No acute fracture involving the thoracic or lumbar spine.  3. Chronic findings as detailed above."    Patient Stated Goals "to get  as much out of it as I can to move better"    Currently in Pain? Yes               TREATMENT:     Therapeutic exercise: to centralize symptoms and improve ROM, strength, muscular endurance, and activity tolerance required for successful completion of functional activities.  - ambulation around clinic for distance in 6 minutes with SPC in LE UE and no AFO. CGA-minA to prevent falls from several stumbles when pt caught R toe on floor. 622 feet. Needed seated break after.  - alternating foot taps on 6 inch step with U UE support (on TM), 3x10 each side, CGA for safety.  - seated hamstring ball rolls on basket  ball, 3x10 each side. AAROM on R at times.  - seated B hip abduction, on edge of chair with knees extended and furniture sliders under heels. Upper body braced arms of chair. 3x20 with green theraband tied at distal thighs.  - ambulation ~ 100 feet to vehicle with CGA using SPC.   Pt required multimodal cuing for proper technique and to facilitate improved neuromuscular control, strength, range of motion, and functional ability resulting in improved performance and form. Required close supervision to minA  for safety during standing activities with occasional unsteadiness or stumble that patient was able to recover from with step strategy or by grabbing a nearby wall/object.     PT Education - 03/12/21 1126     Education Details exercise purpose/form. TKA surgery    Person(s) Educated Patient    Methods Explanation;Demonstration;Tactile cues;Verbal cues    Comprehension Verbalized understanding;Returned demonstration;Verbal cues required;Tactile cues required;Need further instruction              PT Short Term Goals - 03/05/21 1002       PT SHORT TERM GOAL #1   Title Be independent with initial home exercise program for self-management of symptoms.    Baseline To be initiated at visit 2 as appropriate (04/03/2020); provided but not participating (04/24/2020); participating in ambulation and sit <> stand (05/07/2020; 06/26/2020); participating in ambulation practice at home (10/02/2020); states she has been doing a lot of walking to/from/around hospitals (03/05/2021);    Time 2    Period Weeks    Status Partially Met    Target Date 04/17/20               PT Long Term Goals - 03/05/21 1002       PT LONG TERM GOAL #1   Title Be independent with a long-term home exercise program for self-management of symptoms.    Baseline to be updated at visit 2 as appropriate (04/03/2020); partially participating (05/07/2020; 06/26/2020; 10/02/2020); working on walking at home and climbing steps at home  sit <> stands participating regularly (10/31/2020); continues to walk regularly (12/18/2020); states she has been doing a lot of walking to/from/around hospitals (03/04/2021);    Time 12    Period Weeks    Status On-going   TARGET DATE FOR ALL LONG TERM GOALS: 06/26/2020. TARGET DATE FOR UNMET GOALS UPDATED TO 09/18/2020. UPDATED TO 12/25/2020. UPDATED TO 03/12/2021. UPDATED TO 05/27/2021.   Target Date 05/27/21      PT LONG TERM GOAL #2   Title Demonstrate improved FOTO score to equal or greater than 56 by visit #20 demonstrate improvement in overall condition and self-reported functional ability.    Baseline 48 (04/03/2020); 45 (05/07/2020); 45 (06/26/2020); 46 (10/02/20); 59 (10/31/2020); 45 (12/18/2020); 40 (03/04/2021);    Time  12    Period Weeks    Status On-going      PT LONG TERM GOAL #3   Title Patient will improve her 10 MWT speed with her SPC to at least 1.2 m/s to promote better community ambulation and safe crossing of streets.    Baseline to be measured visit 2 (04/03/2020); 0.54 meters/second with SPC and R AFO (04/16/2020): 0.6 meters/second with SPC (05/07/2020);  0.55 meters/second with SPC (06/26/2020); 0.49 m/sec with SPC and AFO (10/02/2020); 0.64 m/s with SPC in right UE, CGA  for safety (10/31/2020); 0.67 m/s with SPC in right UE, no AFO, CGA  for safety (no stumbles) (12/18/2020); 0.59 m/s with SPC in left UE, CGA  for safety (03/04/2021);    Time 12    Period Weeks    Status On-going      PT LONG TERM GOAL #4   Title Patient will improve ABC score by 13 percentage points to demonstrate improved self-reported balance.    Baseline 23.8% (04/03/2020); 32.5% (05/07/2020); 26.3% (06/26/2020); 23.8% (10/02/2020); 35% (10/31/2020); 38.1% (12/18/2020); 33.1% (03/04/2021);    Time 12    Period Weeks    Status On-going      PT LONG TERM GOAL #5   Title Patient will complete 5 Times Sit to Stand test from chair height without UE support in equal or less than 14 seconds to improve B LE power and strength  for transfers and improved mobility, and demonstrate decreased fall risk (threshold between 12 and 15 for increased fall risk).    Baseline 28 seconds with great difficulty from 18.5 inch plinth with B UE support on mat and knees. Very unstable (04/03/2020); 18 seconds with moderate difficulty from 18.5 inch plinth with B UE support on mat. Mildly unstable. (05/07/2020); 18 seconds from same surface with similar limitations (06/26/2020); 27 seconds with moderate difficulty from 18.5 inch plinth with B UE support on mat. Mildly unstable and did not stand up fully last rep. Limited by left glute pain (10/02/2020); 14.28 seconds with B UE support from 18.5 inch plinth with B UE support on mat (10/31/2020); 13.9 seconds with B UE support from 18.5 inch plinth with B UE support on mat. Mildly unstable and did not stand up fully 1st rep. Complains of right knee pain but demonstrates improved confidence (12/18/2020); 15.78 seconds from 18.5 inch plinth with B UE support on mat. Mildly unstable and did not stand up fully 1st rep (03/04/2021);    Time 12    Period Weeks    Status Partially Met      PT LONG TERM GOAL #6   Title Pateint will improve 6 Minute Walk Test distance to equal or greater than 1000 feet with LRAD to demonstrate improved activity tolerance and endurance for community mobility and participation.    Baseline to be tested visit 2 (04/04/2019); 475 feet with SPC with SBA. (04/16/2020); 562 feet with SPC and CGA (05/07/2020); 454 feet with SPC and CGA and at least two stumbles (06/26/2020); 605 feet with SPC in left UE and one stumble (10/02/2020); 680 feet with SPC in left UE, no AFO. CGA-minA to prevent falls from several stumbles when pt caught R toe on floor. (10/31/2020); 656 feet with SPC in R UE, no AFO. CGA-minA to prevent falls from 3 stumbles when pt caught R toe on floor (12/18/2020); 691 feet with SPC in alternating UE. CGA-minA to prevent falls from 1 stumbles when pt caught R toe on floor  (03/04/2021);    Time  12    Period Weeks    Status Partially Met                   Plan - 03/12/21 1125     Clinical Impression Statement Patient arrived 13 min late so treatment time was limited. She verbalized feeling tired several times throughout session and reported she felt more tired by end of session. Patient continues to have weakness in the R LE that limits active hip abduction, flexion, and ER which negatively affects her gait. Continued exercises as tolerated to improve functional mobility and hip strength. Patient scuffed R foot on floor several times and required CGA - SBA for safety during standing exercises. Patient would benefit from continued management of limiting condition by skilled physical therapist to address remaining impairments and functional limitations to work towards stated goals and return to PLOF or maximal functional independence.    Personal Factors and Comorbidities Age;Comorbidity 3+;Education;Past/Current Experience;Fitness;Time since onset of injury/illness/exacerbation;Social Background    Comorbidities Relevant past medical history and comorbidities include anxiety, bilateral chronic knee pain, arthritis, chronic back pain, GERD, memory changes, sinus bradycardia, urge incontinence, R foot bunionectomy (2013), former smoker.    Examination-Activity Limitations Bed Mobility;Lift;Squat;Locomotion Level;Stand;Caring for Others;Carry;Transfers;Dressing    Examination-Participation Restrictions Laundry;Church;Cleaning;Community Activity;Meal Prep;Driving;Yard Work    [employer] Evolving/Moderate complexity    Rehab Potential Fair    PT Frequency 2x / week    PT Duration 12 weeks    PT Treatment/Interventions ADLs/Self Care Home Management;Aquatic Therapy;Cryotherapy;Moist Heat;Functional mobility training;Stair training;Gait training;DME Instruction;Therapeutic activities;Therapeutic exercise;Balance training;Orthotic  Fit/Training;Neuromuscular re-education;Patient/family education;Manual techniques;Passive range of motion;Dry needling;Electrical Stimulation;Energy conservation;Joint Manipulations;Spinal Manipulations;Taping;Splinting    PT Next Visit Plan strengthening/balance exercises, aquatic therapy    PT Home Exercise Plan Medbridge code [id number]    Consulted and Agree with Plan of Care Patient             Patient will benefit from skilled therapeutic intervention in order to improve the following deficits and impairments:  Abnormal gait, Decreased knowledge of use of DME, Impaired sensation, Improper body mechanics, Pain, Decreased coordination, Decreased mobility, Impaired tone, Postural dysfunction, Decreased activity tolerance, Decreased endurance, Decreased range of motion, Decreased strength, Impaired perceived functional ability, Difficulty walking, Decreased balance  Visit Diagnosis: Difficulty in walking, not elsewhere classified  Muscle weakness (generalized)  Other symptoms and signs involving the musculoskeletal system  Bilateral low back pain without sciatica, unspecified chronicity  Unsteadiness on feet  Right knee pain, unspecified chronicity  Left knee pain, unspecified chronicity     Problem List Patient Active Problem List   Diagnosis Date Noted   Chronic back pain 08/07/2019   Depression 08/07/2019   Near syncope 08/06/2019   Sinus bradycardia 08/06/2019   Weakness 11/03/2012    Everlean Alstrom. Graylon Good, PT, DPT 03/12/21, 11:27 AM   [facility] [address] [place], [address] Phone: [phone]   Fax:  [phone]  Name: [name] MRN: [id number] Date of Birth: 03-Dec-1945

## 2021-03-18 ENCOUNTER — Ambulatory Visit: Payer: Medicare Other | Admitting: Physical Therapy

## 2021-03-18 ENCOUNTER — Encounter: Payer: Self-pay | Admitting: Physical Therapy

## 2021-03-18 DIAGNOSIS — M545 Low back pain, unspecified: Secondary | ICD-10-CM

## 2021-03-18 DIAGNOSIS — R2681 Unsteadiness on feet: Secondary | ICD-10-CM | POA: Diagnosis not present

## 2021-03-18 DIAGNOSIS — M25561 Pain in right knee: Secondary | ICD-10-CM

## 2021-03-18 DIAGNOSIS — M6281 Muscle weakness (generalized): Secondary | ICD-10-CM

## 2021-03-18 DIAGNOSIS — R29898 Other symptoms and signs involving the musculoskeletal system: Secondary | ICD-10-CM

## 2021-03-18 DIAGNOSIS — R262 Difficulty in walking, not elsewhere classified: Secondary | ICD-10-CM | POA: Diagnosis not present

## 2021-03-18 NOTE — Therapy (Signed)
[facility] [address], [place], [address] Phone: [phone]   Fax:  [phone]  Physical Therapy Treatment  Patient Details  Name: [name] MRN: [id number] Date of Birth: 01-02-1946 Referring Provider (PT): Erline Hau, [place]   Encounter Date: 03/18/2021   PT End of Session - 03/18/21 1549     Visit Number 42    Number of Visits 53    Date for PT Re-Evaluation 05/27/21    Authorization Type Medicare reporting period from 10/31/2020    Progress Note Due on Visit 59    PT Start Time 1527    PT Stop Time 1557    PT Time Calculation (min) 30 min    Equipment Utilized During Treatment Gait belt    Activity Tolerance Patient limited by fatigue;Patient limited by pain    Behavior During Therapy [facility] for tasks assessed/performed             Past Medical History:  Diagnosis Date   Anxiety    Arthritis    Bilateral chronic knee pain    Chronic back pain    Depression    GERD (gastroesophageal reflux disease)    Insomnia    Memory changes    Sinus bradycardia    Urge incontinence     Past Surgical History:  Procedure Laterality Date   ABDOMINAL HYSTERECTOMY  1995   back sugery     lumbar   BIOPSY THYROID     benign goiter   BUNIONECTOMY  2013   rt foot   COLONOSCOPY     MUSCLE BIOPSY Left 10/03/2012   Procedure: LEFT QUADRICEP MUSCLE BIOPSY;  Surgeon: Odis Hollingshead, MD;  Location: [facility];  Service: General;  Laterality: Left;   NECK SURGERY  2010   cerv disc fused     There were no vitals filed for this visit.   Subjective Assessment - 03/18/21 1528     Subjective Patient arrives with SPC and R AFOs. States her knees are hurting her about the same all the time. She thinks she needs a R TKA but she is afraid to be put to sleep. She states her knees have 5/10 pain currently. No falls since last PT session. She state she thinks her knee is getting worse.     Pertinent History Patient is a 75 y.o. female who presents to outpatient physical therapy with a referral for medical diagnosis thoracic spine pain, lumbar spine fusion, generalized weakness. This patient's chief complaints consist of weakness and difficulty moving R leg leading to the following functional deficits: increased difficulty with daily tasks and mobility including walking, stairs, getting in and out of the car, grocery shopping, general mobility, fear of falling.. She ambulates with a single point cane and carbon fiber AFO on the R ankle.  Relevant past medical history and comorbidities include anxiety, bilateral chronic knee pain, arthritis, chronic back pain, GERD, memory changes, sinus bradycardia, urge incontinence, R foot bunionectomy (2013), former smoker.   Patient denies hx of cancer, stroke, seizures, lung problem, major cardiac events, diabetes, unexplained weight loss, changes in bowel or bladder problems, new onset stumbling or dropping things.    Limitations Lifting;Standing;Walking;House hold activities    Diagnostic tests Chest/Lumbar CT report from 01/14/2020: " IMPRESSION:  1. No CT evidence for acute thoracic, abdominal or pelvic injury.  2. No acute fracture involving the thoracic or lumbar spine.  3. Chronic findings as detailed above."  Patient Stated Goals "to get as much out of it as I can to move better"    Currently in Pain? Yes    Pain Score 5             TREATMENT:      Therapeutic exercise: to centralize symptoms and improve ROM, strength, muscular endurance, and activity tolerance required for successful completion of functional activities.  - alternating foot taps on 6 inch step with U UE support (on TM), 3x10 each side, CGA for safety.  - seated hamstring ball rolls on basket ball, 3x20 each side. Intermittent AAROM on R - seated B hip abduction, on edge of chair with knees extended and furniture sliders under heels. Upper body braced arms of chair. 3x20  with green theraband tied at distal thighs.  - seated long arc quad with 5# AW on right and 15# AW on left, 3x20 each side. (Intermittently painful in R knee) - ambulation ~ 100 feet to vehicle with CGA using SPC.   Pt required multimodal cuing for proper technique and to facilitate improved neuromuscular control, strength, range of motion, and functional ability resulting in improved performance and form. Required close supervision to minA  for safety during standing activities with occasional unsteadiness or stumble that patient was able to recover from with step strategy or by grabbing a nearby wall/object.     PT Education - 03/18/21 1533     Education Details exercise purpose/form. TK    Person(s) Educated Patient    Methods Explanation;Demonstration;Tactile cues;Verbal cues    Comprehension Verbalized understanding;Returned demonstration;Verbal cues required;Tactile cues required;Need further instruction              PT Short Term Goals - 03/05/21 1002       PT SHORT TERM GOAL #1   Title Be independent with initial home exercise program for self-management of symptoms.    Baseline To be initiated at visit 2 as appropriate (04/03/2020); provided but not participating (04/24/2020); participating in ambulation and sit <> stand (05/07/2020; 06/26/2020); participating in ambulation practice at home (10/02/2020); states she has been doing a lot of walking to/from/around hospitals (03/05/2021);    Time 2    Period Weeks    Status Partially Met    Target Date 04/17/20               PT Long Term Goals - 03/05/21 1002       PT LONG TERM GOAL #1   Title Be independent with a long-term home exercise program for self-management of symptoms.    Baseline to be updated at visit 2 as appropriate (04/03/2020); partially participating (05/07/2020; 06/26/2020; 10/02/2020); working on walking at home and climbing steps at home sit <> stands participating regularly (10/31/2020); continues to walk  regularly (12/18/2020); states she has been doing a lot of walking to/from/around hospitals (03/04/2021);    Time 12    Period Weeks    Status On-going   TARGET DATE FOR ALL LONG TERM GOALS: 06/26/2020. TARGET DATE FOR UNMET GOALS UPDATED TO 09/18/2020. UPDATED TO 12/25/2020. UPDATED TO 03/12/2021. UPDATED TO 05/27/2021.   Target Date 05/27/21      PT LONG TERM GOAL #2   Title Demonstrate improved FOTO score to equal or greater than 56 by visit #20 demonstrate improvement in overall condition and self-reported functional ability.    Baseline 48 (04/03/2020); 45 (05/07/2020); 45 (06/26/2020); 46 (10/02/20); 59 (10/31/2020); 45 (12/18/2020); 40 (03/04/2021);    Time 12    Period Weeks  Status On-going      PT LONG TERM GOAL #3   Title Patient will improve her 10 MWT speed with her SPC to at least 1.2 m/s to promote better community ambulation and safe crossing of streets.    Baseline to be measured visit 2 (04/03/2020); 0.54 meters/second with SPC and R AFO (04/16/2020): 0.6 meters/second with SPC (05/07/2020);  0.55 meters/second with SPC (06/26/2020); 0.49 m/sec with SPC and AFO (10/02/2020); 0.64 m/s with SPC in right UE, CGA  for safety (10/31/2020); 0.67 m/s with SPC in right UE, no AFO, CGA  for safety (no stumbles) (12/18/2020); 0.59 m/s with SPC in left UE, CGA  for safety (03/04/2021);    Time 12    Period Weeks    Status On-going      PT LONG TERM GOAL #4   Title Patient will improve ABC score by 13 percentage points to demonstrate improved self-reported balance.    Baseline 23.8% (04/03/2020); 32.5% (05/07/2020); 26.3% (06/26/2020); 23.8% (10/02/2020); 35% (10/31/2020); 38.1% (12/18/2020); 33.1% (03/04/2021);    Time 12    Period Weeks    Status On-going      PT LONG TERM GOAL #5   Title Patient will complete 5 Times Sit to Stand test from chair height without UE support in equal or less than 14 seconds to improve B LE power and strength for transfers and improved mobility, and demonstrate decreased fall  risk (threshold between 12 and 15 for increased fall risk).    Baseline 28 seconds with great difficulty from 18.5 inch plinth with B UE support on mat and knees. Very unstable (04/03/2020); 18 seconds with moderate difficulty from 18.5 inch plinth with B UE support on mat. Mildly unstable. (05/07/2020); 18 seconds from same surface with similar limitations (06/26/2020); 27 seconds with moderate difficulty from 18.5 inch plinth with B UE support on mat. Mildly unstable and did not stand up fully last rep. Limited by left glute pain (10/02/2020); 14.28 seconds with B UE support from 18.5 inch plinth with B UE support on mat (10/31/2020); 13.9 seconds with B UE support from 18.5 inch plinth with B UE support on mat. Mildly unstable and did not stand up fully 1st rep. Complains of right knee pain but demonstrates improved confidence (12/18/2020); 15.78 seconds from 18.5 inch plinth with B UE support on mat. Mildly unstable and did not stand up fully 1st rep (03/04/2021);    Time 12    Period Weeks    Status Partially Met      PT LONG TERM GOAL #6   Title Pateint will improve 6 Minute Walk Test distance to equal or greater than 1000 feet with LRAD to demonstrate improved activity tolerance and endurance for community mobility and participation.    Baseline to be tested visit 2 (04/04/2019); 475 feet with SPC with SBA. (04/16/2020); 562 feet with SPC and CGA (05/07/2020); 454 feet with SPC and CGA and at least two stumbles (06/26/2020); 605 feet with SPC in left UE and one stumble (10/02/2020); 680 feet with SPC in left UE, no AFO. CGA-minA to prevent falls from several stumbles when pt caught R toe on floor. (10/31/2020); 656 feet with SPC in R UE, no AFO. CGA-minA to prevent falls from 3 stumbles when pt caught R toe on floor (12/18/2020); 691 feet with SPC in alternating UE. CGA-minA to prevent falls from 1 stumbles when pt caught R toe on floor (03/04/2021);    Time 12    Period Weeks    Status  Partially Met                    Plan - 03/18/21 1555     Clinical Impression Statement Patient arrived 11 min late and treatment time was limited. Patient had difficulty due to R knee pain that limited her activities today. She also continues to be limited by R LE weakness and is at high risk for falls. Session continued to focus on LE/functional strengthening as tolerated and answering pt's questions about TKA rehab. Patient fatigued quickly and appears to be suffering from decreased quality of life due to her pain and limitations. She did state her R knee felt a bit looser by the end of the session. Patient would benefit from continued management of limiting condition by skilled physical therapist to address remaining impairments and functional limitations to work towards stated goals and return to PLOF or maximal functional independence.    Personal Factors and Comorbidities Age;Comorbidity 3+;Education;Past/Current Experience;Fitness;Time since onset of injury/illness/exacerbation;Social Background    Comorbidities Relevant past medical history and comorbidities include anxiety, bilateral chronic knee pain, arthritis, chronic back pain, GERD, memory changes, sinus bradycardia, urge incontinence, R foot bunionectomy (2013), former smoker.    Examination-Activity Limitations Bed Mobility;Lift;Squat;Locomotion Level;Stand;Caring for Others;Carry;Transfers;Dressing    Examination-Participation Restrictions Laundry;Church;Cleaning;Community Activity;Meal Prep;Driving;Yard Work    [employer] Evolving/Moderate complexity    Rehab Potential Fair    PT Frequency 2x / week    PT Duration 12 weeks    PT Treatment/Interventions ADLs/Self Care Home Management;Aquatic Therapy;Cryotherapy;Moist Heat;Functional mobility training;Stair training;Gait training;DME Instruction;Therapeutic activities;Therapeutic exercise;Balance training;Orthotic Fit/Training;Neuromuscular re-education;Patient/family  education;Manual techniques;Passive range of motion;Dry needling;Electrical Stimulation;Energy conservation;Joint Manipulations;Spinal Manipulations;Taping;Splinting    PT Next Visit Plan strengthening/balance exercises, aquatic therapy    PT Home Exercise Plan Medbridge code [email or web address]    Consulted and Agree with Plan of Care Patient             Patient will benefit from skilled therapeutic intervention in order to improve the following deficits and impairments:  Abnormal gait, Decreased knowledge of use of DME, Impaired sensation, Improper body mechanics, Pain, Decreased coordination, Decreased mobility, Impaired tone, Postural dysfunction, Decreased activity tolerance, Decreased endurance, Decreased range of motion, Decreased strength, Impaired perceived functional ability, Difficulty walking, Decreased balance  Visit Diagnosis: Difficulty in walking, not elsewhere classified  Muscle weakness (generalized)  Other symptoms and signs involving the musculoskeletal system  Bilateral low back pain without sciatica, unspecified chronicity  Unsteadiness on feet  Right knee pain, unspecified chronicity     Problem List Patient Active Problem List   Diagnosis Date Noted   Chronic back pain 08/07/2019   Depression 08/07/2019   Near syncope 08/06/2019   Sinus bradycardia 08/06/2019   Weakness 11/03/2012    Everlean Alstrom. Graylon Good, PT, DPT 03/18/21, 4:55 PM  [facility] [address] [place], [address] Phone: [phone]   Fax:  [phone]  Name: [name] MRN: [id number] Date of Birth: 1945-09-09

## 2021-03-26 ENCOUNTER — Ambulatory Visit: Payer: Medicare Other | Attending: Physician Assistant | Admitting: Physical Therapy

## 2021-03-26 ENCOUNTER — Telehealth: Payer: Self-pay | Admitting: Physical Therapy

## 2021-03-26 DIAGNOSIS — M25562 Pain in left knee: Secondary | ICD-10-CM | POA: Insufficient documentation

## 2021-03-26 DIAGNOSIS — R29898 Other symptoms and signs involving the musculoskeletal system: Secondary | ICD-10-CM | POA: Insufficient documentation

## 2021-03-26 DIAGNOSIS — M6281 Muscle weakness (generalized): Secondary | ICD-10-CM | POA: Insufficient documentation

## 2021-03-26 DIAGNOSIS — M25561 Pain in right knee: Secondary | ICD-10-CM | POA: Insufficient documentation

## 2021-03-26 DIAGNOSIS — R262 Difficulty in walking, not elsewhere classified: Secondary | ICD-10-CM | POA: Insufficient documentation

## 2021-03-26 DIAGNOSIS — R2681 Unsteadiness on feet: Secondary | ICD-10-CM | POA: Insufficient documentation

## 2021-03-26 DIAGNOSIS — M545 Low back pain, unspecified: Secondary | ICD-10-CM | POA: Insufficient documentation

## 2021-03-26 NOTE — Telephone Encounter (Signed)
Called patient when she did not show up for her appointment. Patient states she got the days wrong and was unaware she had an appointment today. She checked her schedule and realized she had forgotten. Confirmed her next appointment scheduled at 1pm on Wednesday 04/02/2021. States she would like a call if we get an opening tomorrow.   [name]. Graylon Good, PT, DPT 03/26/21, 2:55 PM

## 2021-03-27 ENCOUNTER — Other Ambulatory Visit: Payer: Self-pay

## 2021-03-27 ENCOUNTER — Encounter: Payer: Self-pay | Admitting: Physical Therapy

## 2021-03-27 ENCOUNTER — Ambulatory Visit: Payer: Medicare Other | Admitting: Physical Therapy

## 2021-03-27 DIAGNOSIS — M545 Low back pain, unspecified: Secondary | ICD-10-CM | POA: Diagnosis not present

## 2021-03-27 DIAGNOSIS — M25562 Pain in left knee: Secondary | ICD-10-CM | POA: Diagnosis not present

## 2021-03-27 DIAGNOSIS — R2681 Unsteadiness on feet: Secondary | ICD-10-CM

## 2021-03-27 DIAGNOSIS — R262 Difficulty in walking, not elsewhere classified: Secondary | ICD-10-CM | POA: Diagnosis not present

## 2021-03-27 DIAGNOSIS — R29898 Other symptoms and signs involving the musculoskeletal system: Secondary | ICD-10-CM

## 2021-03-27 DIAGNOSIS — M25561 Pain in right knee: Secondary | ICD-10-CM | POA: Diagnosis not present

## 2021-03-27 DIAGNOSIS — M6281 Muscle weakness (generalized): Secondary | ICD-10-CM

## 2021-03-27 NOTE — Therapy (Signed)
[facility] [address], [place], [address] Phone: [phone]   Fax:  [phone]  Physical Therapy Treatment  Patient Details  Name: [name] MRN: [id number] Date of Birth: 13-Jan-1946 Referring Provider (PT): [name], [place]   Encounter Date: 03/27/2021   PT End of Session - 03/27/21 1609     Visit Number 43    Number of Visits 79    Date for PT Re-Evaluation 05/27/21    Authorization Type Medicare reporting period from 10/31/2020    Progress Note Due on Visit 68    PT Start Time 1605    PT Stop Time 1643    PT Time Calculation (min) 38 min    Equipment Utilized During Treatment Gait belt    Activity Tolerance Patient limited by fatigue;Patient limited by pain    Behavior During Therapy [facility] for tasks assessed/performed             Past Medical History:  Diagnosis Date   Anxiety    Arthritis    Bilateral chronic knee pain    Chronic back pain    Depression    GERD (gastroesophageal reflux disease)    Insomnia    Memory changes    Sinus bradycardia    Urge incontinence     Past Surgical History:  Procedure Laterality Date   ABDOMINAL HYSTERECTOMY  1995   back sugery     lumbar   BIOPSY THYROID     benign goiter   BUNIONECTOMY  2013   rt foot   COLONOSCOPY     MUSCLE BIOPSY Left 10/03/2012   Procedure: LEFT QUADRICEP MUSCLE BIOPSY;  Surgeon: Odis Hollingshead, MD;  Location: [facility];  Service: General;  Laterality: Left;   NECK SURGERY  2010   cerv disc fused     There were no vitals filed for this visit.   Subjective Assessment - 03/27/21 1606     Subjective Patient arrives with SPC and R AFO donned. States her pain is what she is usally dealing with (R knee pain) without numeric rating. States she has had no falls since last PT session and she felt okay after last PT session. She has not been doing as much walking since last PT session.    Pertinent  History Patient is a 76 y.o. female who presents to outpatient physical therapy with a referral for medical diagnosis thoracic spine pain, lumbar spine fusion, generalized weakness. This patient's chief complaints consist of weakness and difficulty moving R leg leading to the following functional deficits: increased difficulty with daily tasks and mobility including walking, stairs, getting in and out of the car, grocery shopping, general mobility, fear of falling.. She ambulates with a single point cane and carbon fiber AFO on the R ankle.  Relevant past medical history and comorbidities include anxiety, bilateral chronic knee pain, arthritis, chronic back pain, GERD, memory changes, sinus bradycardia, urge incontinence, R foot bunionectomy (2013), former smoker.   Patient denies hx of cancer, stroke, seizures, lung problem, major cardiac events, diabetes, unexplained weight loss, changes in bowel or bladder problems, new onset stumbling or dropping things.    Limitations Lifting;Standing;Walking;House hold activities    Diagnostic tests Chest/Lumbar CT report from 01/14/2020: " IMPRESSION:  1. No CT evidence for acute thoracic, abdominal or pelvic injury.  2. No acute fracture involving the thoracic or lumbar spine.  3. Chronic findings as detailed above."    Patient Stated Goals "  to get as much out of it as I can to move better"    Currently in Pain? Yes                 TREATMENT:      Therapeutic exercise: to centralize symptoms and improve ROM, strength, muscular endurance, and activity tolerance required for successful completion of functional activities.  - alternating foot taps on 6 inch step with U UE support (on TM), 3x10 each side, CGA for safety.  - seated hamstring ball rolls on basket ball, 3x20 each side. Intermittent AAROM on R - seated B hip abduction, on edge of chair with knees extended and furniture sliders under heels. Upper body braced arms of chair. 3x20 with green  theraband tied at distal thighs.  - seated long arc quad with 5# AW on right and 20# AW on left, 3x20 each side. (Intermittently painful in R knee) - hooklying bridge, 1x15 minimal hold, 2x10 with instructions for 5 second hold. Required assistance to position R LE.  - sidelying R hip clam, 3x10  - ambulation ~ 100 feet to vehicle with CGA using SPC.   Pt required multimodal cuing for proper technique and to facilitate improved neuromuscular control, strength, range of motion, and functional ability resulting in improved performance and form. Required close supervision to minA  for safety during standing activities with occasional unsteadiness or stumble that patient was able to recover from with step strategy or by grabbing a nearby wall/object.       PT Education - 03/27/21 1622     Education Details exercise purpose/form.    Person(s) Educated Patient    Methods Explanation;Demonstration;Tactile cues;Verbal cues    Comprehension Verbalized understanding;Returned demonstration;Verbal cues required;Tactile cues required;Need further instruction              PT Short Term Goals - 03/05/21 1002       PT SHORT TERM GOAL #1   Title Be independent with initial home exercise program for self-management of symptoms.    Baseline To be initiated at visit 2 as appropriate (04/03/2020); provided but not participating (04/24/2020); participating in ambulation and sit <> stand (05/07/2020; 06/26/2020); participating in ambulation practice at home (10/02/2020); states she has been doing a lot of walking to/from/around hospitals (03/05/2021);    Time 2    Period Weeks    Status Partially Met    Target Date 04/17/20               PT Long Term Goals - 03/05/21 1002       PT LONG TERM GOAL #1   Title Be independent with a long-term home exercise program for self-management of symptoms.    Baseline to be updated at visit 2 as appropriate (04/03/2020); partially participating (05/07/2020;  06/26/2020; 10/02/2020); working on walking at home and climbing steps at home sit <> stands participating regularly (10/31/2020); continues to walk regularly (12/18/2020); states she has been doing a lot of walking to/from/around hospitals (03/04/2021);    Time 12    Period Weeks    Status On-going   TARGET DATE FOR ALL LONG TERM GOALS: 06/26/2020. TARGET DATE FOR UNMET GOALS UPDATED TO 09/18/2020. UPDATED TO 12/25/2020. UPDATED TO 03/12/2021. UPDATED TO 05/27/2021.   Target Date 05/27/21      PT LONG TERM GOAL #2   Title Demonstrate improved FOTO score to equal or greater than 56 by visit #20 demonstrate improvement in overall condition and self-reported functional ability.    Baseline 48 (04/03/2020); 45 (  05/07/2020); 45 (06/26/2020); 46 (10/02/20); 59 (10/31/2020); 45 (12/18/2020); 40 (03/04/2021);    Time 12    Period Weeks    Status On-going      PT LONG TERM GOAL #3   Title Patient will improve her 10 MWT speed with her SPC to at least 1.2 m/s to promote better community ambulation and safe crossing of streets.    Baseline to be measured visit 2 (04/03/2020); 0.54 meters/second with SPC and R AFO (04/16/2020): 0.6 meters/second with SPC (05/07/2020);  0.55 meters/second with SPC (06/26/2020); 0.49 m/sec with SPC and AFO (10/02/2020); 0.64 m/s with SPC in right UE, CGA  for safety (10/31/2020); 0.67 m/s with SPC in right UE, no AFO, CGA  for safety (no stumbles) (12/18/2020); 0.59 m/s with SPC in left UE, CGA  for safety (03/04/2021);    Time 12    Period Weeks    Status On-going      PT LONG TERM GOAL #4   Title Patient will improve ABC score by 13 percentage points to demonstrate improved self-reported balance.    Baseline 23.8% (04/03/2020); 32.5% (05/07/2020); 26.3% (06/26/2020); 23.8% (10/02/2020); 35% (10/31/2020); 38.1% (12/18/2020); 33.1% (03/04/2021);    Time 12    Period Weeks    Status On-going      PT LONG TERM GOAL #5   Title Patient will complete 5 Times Sit to Stand test from chair height without UE  support in equal or less than 14 seconds to improve B LE power and strength for transfers and improved mobility, and demonstrate decreased fall risk (threshold between 12 and 15 for increased fall risk).    Baseline 28 seconds with great difficulty from 18.5 inch plinth with B UE support on mat and knees. Very unstable (04/03/2020); 18 seconds with moderate difficulty from 18.5 inch plinth with B UE support on mat. Mildly unstable. (05/07/2020); 18 seconds from same surface with similar limitations (06/26/2020); 27 seconds with moderate difficulty from 18.5 inch plinth with B UE support on mat. Mildly unstable and did not stand up fully last rep. Limited by left glute pain (10/02/2020); 14.28 seconds with B UE support from 18.5 inch plinth with B UE support on mat (10/31/2020); 13.9 seconds with B UE support from 18.5 inch plinth with B UE support on mat. Mildly unstable and did not stand up fully 1st rep. Complains of right knee pain but demonstrates improved confidence (12/18/2020); 15.78 seconds from 18.5 inch plinth with B UE support on mat. Mildly unstable and did not stand up fully 1st rep (03/04/2021);    Time 12    Period Weeks    Status Partially Met      PT LONG TERM GOAL #6   Title Pateint will improve 6 Minute Walk Test distance to equal or greater than 1000 feet with LRAD to demonstrate improved activity tolerance and endurance for community mobility and participation.    Baseline to be tested visit 2 (04/04/2019); 475 feet with SPC with SBA. (04/16/2020); 562 feet with SPC and CGA (05/07/2020); 454 feet with SPC and CGA and at least two stumbles (06/26/2020); 605 feet with SPC in left UE and one stumble (10/02/2020); 680 feet with SPC in left UE, no AFO. CGA-minA to prevent falls from several stumbles when pt caught R toe on floor. (10/31/2020); 656 feet with SPC in R UE, no AFO. CGA-minA to prevent falls from 3 stumbles when pt caught R toe on floor (12/18/2020); 691 feet with SPC in alternating UE. CGA-minA  to prevent falls  from 1 stumbles when pt caught R toe on floor (03/04/2021);    Time 12    Period Weeks    Status Partially Met                   Plan - 03/27/21 1622     Clinical Impression Statement Patient on time this date. Patient was able to complete all exercises with some difficulty due to R knee pain and R LE weakness (especially at the hip). Continued to work on LE and functional strengthening to help prevent decline in function. Patient is primary caregiver for her husband who is unwell. Patient required physical assistance at the R LE to improve effectiveness of exercises and guarding for safety. Patient would benefit from continued management of limiting condition by skilled physical therapist to address remaining impairments and functional limitations to work towards stated goals and return to PLOF or maximal functional independence.    Personal Factors and Comorbidities Age;Comorbidity 3+;Education;Past/Current Experience;Fitness;Time since onset of injury/illness/exacerbation;Social Background    Comorbidities Relevant past medical history and comorbidities include anxiety, bilateral chronic knee pain, arthritis, chronic back pain, GERD, memory changes, sinus bradycardia, urge incontinence, R foot bunionectomy (2013), former smoker.    Examination-Activity Limitations Bed Mobility;Lift;Squat;Locomotion Level;Stand;Caring for Others;Carry;Transfers;Dressing    Examination-Participation Restrictions Laundry;Church;Cleaning;Community Activity;Meal Prep;Driving;Yard Work    [employer] Evolving/Moderate complexity    Rehab Potential Fair    PT Frequency 2x / week    PT Duration 12 weeks    PT Treatment/Interventions ADLs/Self Care Home Management;Aquatic Therapy;Cryotherapy;Moist Heat;Functional mobility training;Stair training;Gait training;DME Instruction;Therapeutic activities;Therapeutic exercise;Balance training;Orthotic Fit/Training;Neuromuscular  re-education;Patient/family education;Manual techniques;Passive range of motion;Dry needling;Electrical Stimulation;Energy conservation;Joint Manipulations;Spinal Manipulations;Taping;Splinting    PT Next Visit Plan strengthening/balance exercises, aquatic therapy    PT Home Exercise Plan Medbridge code [id number]    Consulted and Agree with Plan of Care Patient             Patient will benefit from skilled therapeutic intervention in order to improve the following deficits and impairments:  Abnormal gait, Decreased knowledge of use of DME, Impaired sensation, Improper body mechanics, Pain, Decreased coordination, Decreased mobility, Impaired tone, Postural dysfunction, Decreased activity tolerance, Decreased endurance, Decreased range of motion, Decreased strength, Impaired perceived functional ability, Difficulty walking, Decreased balance  Visit Diagnosis: Difficulty in walking, not elsewhere classified  Muscle weakness (generalized)  Other symptoms and signs involving the musculoskeletal system  Bilateral low back pain without sciatica, unspecified chronicity  Unsteadiness on feet  Right knee pain, unspecified chronicity  Left knee pain, unspecified chronicity     Problem List Patient Active Problem List   Diagnosis Date Noted   Chronic back pain 08/07/2019   Depression 08/07/2019   Near syncope 08/06/2019   Sinus bradycardia 08/06/2019   Weakness 11/03/2012    Nancy Nordmann, PT 03/27/2021, 4:23 PM  [facility] [address] [place], [address] Phone: [phone]   Fax:  [phone]  Name: [name] MRN: [id number] Date of Birth: 1945-05-04

## 2021-04-02 ENCOUNTER — Ambulatory Visit: Payer: Medicare Other | Admitting: Physical Therapy

## 2021-04-03 ENCOUNTER — Encounter: Payer: Medicare Other | Admitting: Physical Therapy

## 2021-04-09 ENCOUNTER — Ambulatory Visit: Payer: Medicare Other | Admitting: Physical Therapy

## 2021-04-10 ENCOUNTER — Ambulatory Visit: Payer: Medicare Other | Admitting: Physical Therapy

## 2021-04-16 ENCOUNTER — Ambulatory Visit: Payer: Medicare Other | Admitting: Physical Therapy

## 2021-04-17 ENCOUNTER — Ambulatory Visit: Payer: Medicare Other | Admitting: Physical Therapy

## 2021-04-23 ENCOUNTER — Ambulatory Visit: Payer: Medicare Other | Attending: Physician Assistant | Admitting: Physical Therapy

## 2021-04-23 ENCOUNTER — Encounter: Payer: Self-pay | Admitting: Physical Therapy

## 2021-04-23 ENCOUNTER — Other Ambulatory Visit: Payer: Self-pay

## 2021-04-23 DIAGNOSIS — M25562 Pain in left knee: Secondary | ICD-10-CM | POA: Diagnosis not present

## 2021-04-23 DIAGNOSIS — R29898 Other symptoms and signs involving the musculoskeletal system: Secondary | ICD-10-CM | POA: Diagnosis not present

## 2021-04-23 DIAGNOSIS — R2681 Unsteadiness on feet: Secondary | ICD-10-CM | POA: Insufficient documentation

## 2021-04-23 DIAGNOSIS — M6281 Muscle weakness (generalized): Secondary | ICD-10-CM | POA: Diagnosis not present

## 2021-04-23 DIAGNOSIS — M25561 Pain in right knee: Secondary | ICD-10-CM | POA: Insufficient documentation

## 2021-04-23 DIAGNOSIS — M545 Low back pain, unspecified: Secondary | ICD-10-CM | POA: Diagnosis not present

## 2021-04-23 DIAGNOSIS — R262 Difficulty in walking, not elsewhere classified: Secondary | ICD-10-CM | POA: Diagnosis not present

## 2021-04-23 NOTE — Therapy (Signed)
[facility] [address], [place], [address] Phone: [phone]   Fax:  [phone]  Physical Therapy Treatment  Patient Details  Name: [name] MRN: [id number] Date of Birth: [birth year] Referring Provider (PT): Erline Hau, [place]   Encounter Date: 04/23/2021   PT End of Session - 04/23/21 1311     Visit Number 44    Number of Visits 53    Date for PT Re-Evaluation 05/27/21    Authorization Type Medicare reporting period from 10/31/2020    Progress Note Due on Visit 91    PT Start Time 1304    PT Stop Time 1342    PT Time Calculation (min) 38 min    Equipment Utilized During Treatment Gait belt    Activity Tolerance Patient limited by fatigue;Patient limited by pain    Behavior During Therapy [facility] for tasks assessed/performed             Past Medical History:  Diagnosis Date   Anxiety    Arthritis    Bilateral chronic knee pain    Chronic back pain    Depression    GERD (gastroesophageal reflux disease)    Insomnia    Memory changes    Sinus bradycardia    Urge incontinence     Past Surgical History:  Procedure Laterality Date   ABDOMINAL HYSTERECTOMY  1995   back sugery     lumbar   BIOPSY THYROID     benign goiter   BUNIONECTOMY  2013   rt foot   COLONOSCOPY     MUSCLE BIOPSY Left 10/03/2012   Procedure: LEFT QUADRICEP MUSCLE BIOPSY;  Surgeon: Odis Hollingshead, MD;  Location: [place];  Service: General;  Laterality: Left;   NECK SURGERY  2010   cerv disc fused     There were no vitals filed for this visit.   Subjective Assessment - 04/23/21 1307     Subjective Pateint reports she has her usual pain/burning 3/10 in the right knee. She arrives with [facility] and R AFO. She is doing step ups and kicking out at home. For some reason her R toes are getting numb. Reports no falls since last PT session. States her husband is getting better.    Pertinent History  Patient is a 76 y.o. female who presents to outpatient physical therapy with a referral for medical diagnosis thoracic spine pain, lumbar spine fusion, generalized weakness. This patient's chief complaints consist of weakness and difficulty moving R leg leading to the following functional deficits: increased difficulty with daily tasks and mobility including walking, stairs, getting in and out of the car, grocery shopping, general mobility, fear of falling.. She ambulates with a single point cane and carbon fiber AFO on the R ankle.  Relevant past medical history and comorbidities include anxiety, bilateral chronic knee pain, arthritis, chronic back pain, GERD, memory changes, sinus bradycardia, urge incontinence, R foot bunionectomy (2013), former smoker.   Patient denies hx of cancer, stroke, seizures, lung problem, major cardiac events, diabetes, unexplained weight loss, changes in bowel or bladder problems, new onset stumbling or dropping things.    Limitations Lifting;Standing;Walking;House hold activities    Diagnostic tests Chest/Lumbar CT report from 01/14/2020: " IMPRESSION:  1. No CT evidence for acute thoracic, abdominal or pelvic injury.  2. No acute fracture involving the thoracic or lumbar spine.  3. Chronic findings as detailed above."    Patient Stated Goals "to get as  much out of it as I can to move better"    Currently in Pain? Yes    Pain Score 3                 TREATMENT:      Therapeutic exercise: to centralize symptoms and improve ROM, strength, muscular endurance, and activity tolerance required for successful completion of functional activities.  - alternating foot taps on 6 inch step with B UE support (on TM and SPC), 3x10 each side, CGA for safety.  - seated hamstring ball rolls on basket ball, 3x20 each side. Intermittent AAROM on R - seated B hip abduction, on edge of chair with knees extended and furniture sliders under heels. Upper body braced arms of chair. 3x20  with green theraband tied at distal thighs.  - seated long arc quad with 5# AW on right and 20# AW on left, 3x20 each side. (Intermittently painful in R knee) - hooklying bridge, 2x10 with instructions for 5 second hold. Required assistance to position R LE.  - sidelying R hip clam, 3x10 - ambulation ~ 100 feet to vehicle with CGA using SPC.   Pt required multimodal cuing for proper technique and to facilitate improved neuromuscular control, strength, range of motion, and functional ability resulting in improved performance and form. Required close supervision to minA  for safety during standing activities with occasional unsteadiness or stumble that patient was able to recover from with step strategy or by grabbing a nearby wall/object.         PT Education - 04/23/21 1324     Education Details exercise purpose/form. Reviewed cancelation/no-show policy with patient and confirmed patient has correct phone number for clinic; patient verbalized understanding (04/23/21).    Person(s) Educated Patient    Methods Explanation;Demonstration;Tactile cues;Verbal cues    Comprehension Verbalized understanding;Returned demonstration;Verbal cues required;Tactile cues required;Need further instruction              PT Short Term Goals - 03/05/21 1002       PT SHORT TERM GOAL #1   Title Be independent with initial home exercise program for self-management of symptoms.    Baseline To be initiated at visit 2 as appropriate (04/03/2020); provided but not participating (04/24/2020); participating in ambulation and sit <> stand (05/07/2020; 06/26/2020); participating in ambulation practice at home (10/02/2020); states she has been doing a lot of walking to/from/around hospitals (03/05/2021);    Time 2    Period Weeks    Status Partially Met    Target Date 04/17/20               PT Long Term Goals - 03/05/21 1002       PT LONG TERM GOAL #1   Title Be independent with a long-term home exercise  program for self-management of symptoms.    Baseline to be updated at visit 2 as appropriate (04/03/2020); partially participating (05/07/2020; 06/26/2020; 10/02/2020); working on walking at home and climbing steps at home sit <> stands participating regularly (10/31/2020); continues to walk regularly (12/18/2020); states she has been doing a lot of walking to/from/around hospitals (03/04/2021);    Time 12    Period Weeks    Status On-going   TARGET DATE FOR ALL LONG TERM GOALS: 06/26/2020. TARGET DATE FOR UNMET GOALS UPDATED TO 09/18/2020. UPDATED TO 12/25/2020. UPDATED TO 03/12/2021. UPDATED TO 05/27/2021.   Target Date 05/27/21      PT LONG TERM GOAL #2   Title Demonstrate improved FOTO score to equal or greater  than 56 by visit #20 demonstrate improvement in overall condition and self-reported functional ability.    Baseline 48 (04/03/2020); 45 (05/07/2020); 45 (06/26/2020); 46 (10/02/20); 59 (10/31/2020); 45 (12/18/2020); 40 (03/04/2021);    Time 12    Period Weeks    Status On-going      PT LONG TERM GOAL #3   Title Patient will improve her 10 MWT speed with her SPC to at least 1.2 m/s to promote better community ambulation and safe crossing of streets.    Baseline to be measured visit 2 (04/03/2020); 0.54 meters/second with SPC and R AFO (04/16/2020): 0.6 meters/second with SPC (05/07/2020);  0.55 meters/second with SPC (06/26/2020); 0.49 m/sec with SPC and AFO (10/02/2020); 0.64 m/s with SPC in right UE, CGA  for safety (10/31/2020); 0.67 m/s with SPC in right UE, no AFO, CGA  for safety (no stumbles) (12/18/2020); 0.59 m/s with SPC in left UE, CGA  for safety (03/04/2021);    Time 12    Period Weeks    Status On-going      PT LONG TERM GOAL #4   Title Patient will improve ABC score by 13 percentage points to demonstrate improved self-reported balance.    Baseline 23.8% (04/03/2020); 32.5% (05/07/2020); 26.3% (06/26/2020); 23.8% (10/02/2020); 35% (10/31/2020); 38.1% (12/18/2020); 33.1% (03/04/2021);    Time 12     Period Weeks    Status On-going      PT LONG TERM GOAL #5   Title Patient will complete 5 Times Sit to Stand test from chair height without UE support in equal or less than 14 seconds to improve B LE power and strength for transfers and improved mobility, and demonstrate decreased fall risk (threshold between 12 and 15 for increased fall risk).    Baseline 28 seconds with great difficulty from 18.5 inch plinth with B UE support on mat and knees. Very unstable (04/03/2020); 18 seconds with moderate difficulty from 18.5 inch plinth with B UE support on mat. Mildly unstable. (05/07/2020); 18 seconds from same surface with similar limitations (06/26/2020); 27 seconds with moderate difficulty from 18.5 inch plinth with B UE support on mat. Mildly unstable and did not stand up fully last rep. Limited by left glute pain (10/02/2020); 14.28 seconds with B UE support from 18.5 inch plinth with B UE support on mat (10/31/2020); 13.9 seconds with B UE support from 18.5 inch plinth with B UE support on mat. Mildly unstable and did not stand up fully 1st rep. Complains of right knee pain but demonstrates improved confidence (12/18/2020); 15.78 seconds from 18.5 inch plinth with B UE support on mat. Mildly unstable and did not stand up fully 1st rep (03/04/2021);    Time 12    Period Weeks    Status Partially Met      PT LONG TERM GOAL #6   Title Pateint will improve 6 Minute Walk Test distance to equal or greater than 1000 feet with LRAD to demonstrate improved activity tolerance and endurance for community mobility and participation.    Baseline to be tested visit 2 (04/04/2019); 475 feet with SPC with SBA. (04/16/2020); 562 feet with SPC and CGA (05/07/2020); 454 feet with SPC and CGA and at least two stumbles (06/26/2020); 605 feet with SPC in left UE and one stumble (10/02/2020); 680 feet with SPC in left UE, no AFO. CGA-minA to prevent falls from several stumbles when pt caught R toe on floor. (10/31/2020); 656 feet with SPC  in R UE, no AFO. CGA-minA to prevent falls from  3 stumbles when pt caught R toe on floor (12/18/2020); 691 feet with SPC in alternating UE. CGA-minA to prevent falls from 1 stumbles when pt caught R toe on floor (03/04/2021);    Time 12    Period Weeks    Status Partially Met                   Plan - 04/23/21 1331     Clinical Impression Statement Patient was nearly on time and 4 min late. 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 Patient required encouragement to fully engage in exercises but was cooperative and pleasant. ?Reviewed cancelation/no-show policy with patient and confirmed patient has correct phone number for clinic; patient verbalized understanding. Patient would benefit from continued management of limiting condition by skilled physical therapist to address remaining impairments and functional limitations to work towards stated goals and return to PLOF or maximal functional independence.    Personal Factors and Comorbidities Age;Comorbidity 3+;Education;Past/Current Experience;Fitness;Time since onset of injury/illness/exacerbation;Social Background    Comorbidities Relevant past medical history and comorbidities include anxiety, bilateral chronic knee pain, arthritis, chronic back pain, GERD, memory changes, sinus bradycardia, urge incontinence, R foot bunionectomy (2013), former smoker.    Examination-Activity Limitations Bed Mobility;Lift;Squat;Locomotion Level;Stand;Caring for Others;Carry;Transfers;Dressing    Examination-Participation Restrictions Laundry;Church;Cleaning;Community Activity;Meal Prep;Driving;Yard Work    [employer] Evolving/Moderate complexity    Rehab Potential Fair    PT Frequency 2x / week    PT Duration 12 weeks    PT Treatment/Interventions ADLs/Self Care Home Management;Aquatic  Therapy;Cryotherapy;Moist Heat;Functional mobility training;Stair training;Gait training;DME Instruction;Therapeutic activities;Therapeutic exercise;Balance training;Orthotic Fit/Training;Neuromuscular re-education;Patient/family education;Manual techniques;Passive range of motion;Dry needling;Electrical Stimulation;Energy conservation;Joint Manipulations;Spinal Manipulations;Taping;Splinting    PT Next Visit Plan strengthening/balance exercises, aquatic therapy    PT Home Exercise Plan Medbridge code [email or web address]    Consulted and Agree with Plan of Care Patient             Patient will benefit from skilled therapeutic intervention in order to improve the following deficits and impairments:  Abnormal gait, Decreased knowledge of use of DME, Impaired sensation, Improper body mechanics, Pain, Decreased coordination, Decreased mobility, Impaired tone, Postural dysfunction, Decreased activity tolerance, Decreased endurance, Decreased range of motion, Decreased strength, Impaired perceived functional ability, Difficulty walking, Decreased balance  Visit Diagnosis: Difficulty in walking, not elsewhere classified  Muscle weakness (generalized)  Other symptoms and signs involving the musculoskeletal system  Bilateral low back pain without sciatica, unspecified chronicity  Unsteadiness on feet  Right knee pain, unspecified chronicity  Left knee pain, unspecified chronicity     Problem List Patient Active Problem List   Diagnosis Date Noted   Chronic back pain 08/07/2019   Depression 08/07/2019   Near syncope 08/06/2019   Sinus bradycardia 08/06/2019   Weakness 11/03/2012    Everlean Alstrom. Graylon Good, PT, DPT 04/23/21, 4:11 PM   [facility] [address] [place], [address] Phone: [phone]   Fax:  [phone]  Name: RIVKY CLENDENNING MRN: [id number] Date of Birth: Dec 09, 1945

## 2021-04-30 ENCOUNTER — Other Ambulatory Visit: Payer: Self-pay

## 2021-04-30 ENCOUNTER — Ambulatory Visit: Payer: Medicare Other | Admitting: Physical Therapy

## 2021-04-30 ENCOUNTER — Encounter: Payer: Self-pay | Admitting: Physical Therapy

## 2021-04-30 DIAGNOSIS — M25562 Pain in left knee: Secondary | ICD-10-CM

## 2021-04-30 DIAGNOSIS — M25561 Pain in right knee: Secondary | ICD-10-CM | POA: Diagnosis not present

## 2021-04-30 DIAGNOSIS — M545 Low back pain, unspecified: Secondary | ICD-10-CM | POA: Diagnosis not present

## 2021-04-30 DIAGNOSIS — M6281 Muscle weakness (generalized): Secondary | ICD-10-CM

## 2021-04-30 DIAGNOSIS — R2681 Unsteadiness on feet: Secondary | ICD-10-CM | POA: Diagnosis not present

## 2021-04-30 DIAGNOSIS — R29898 Other symptoms and signs involving the musculoskeletal system: Secondary | ICD-10-CM

## 2021-04-30 DIAGNOSIS — R262 Difficulty in walking, not elsewhere classified: Secondary | ICD-10-CM | POA: Diagnosis not present

## 2021-04-30 NOTE — Therapy (Signed)
[facility] [address] [place], [address] Phone: [phone]   Fax:  [phone]  Physical Therapy Treatment  Patient Details  Name: [name] MRN: [id number] Date of Birth: 11/08/1945 Referring Provider (PT): Erline Hau, [place]   Encounter Date: 04/30/2021   PT End of Session - 04/30/21 1455     Visit Number 45    Number of Visits 60    Date for PT Re-Evaluation 05/27/21    Authorization Type Medicare reporting period from 10/31/2020    Progress Note Due on Visit 78    PT Start Time 1305    PT Stop Time 1345    PT Time Calculation (min) 40 min    Equipment Utilized During Treatment Gait belt    Activity Tolerance Patient limited by fatigue;Patient limited by pain    Behavior During Therapy [facility] for tasks assessed/performed             Past Medical History:  Diagnosis Date   Anxiety    Arthritis    Bilateral chronic knee pain    Chronic back pain    Depression    GERD (gastroesophageal reflux disease)    Insomnia    Memory changes    Sinus bradycardia    Urge incontinence     Past Surgical History:  Procedure Laterality Date   ABDOMINAL HYSTERECTOMY  1995   back sugery     lumbar   BIOPSY THYROID     benign goiter   BUNIONECTOMY  2013   rt foot   COLONOSCOPY     MUSCLE BIOPSY Left 10/03/2012   Procedure: LEFT QUADRICEP MUSCLE BIOPSY;  Surgeon: Odis Hollingshead, MD;  Location: [place];  Service: General;  Laterality: Left;   NECK SURGERY  2010   cerv disc fused     There were no vitals filed for this visit.   Subjective Assessment - 04/30/21 1447     Subjective Patient arrives using SPC and wearing R AFO. She states her R knee has been really hurting and rates the pain 5/10 upon arrival. She states she needs to make arrangements for a R TKA. She states she is concerned that her left knee is taking too much of the pressure from her R knee hurting and  causing the left to hurt as well. She describes the pain as burning. She states she has been trying to stay off of her R LE as much as possible and has not done any home exercises since last PT session.    Pertinent History Patient is a 76 y.o. female who presents to outpatient physical therapy with a referral for medical diagnosis thoracic spine pain, lumbar spine fusion, generalized weakness. This patient's chief complaints consist of weakness and difficulty moving R leg leading to the following functional deficits: increased difficulty with daily tasks and mobility including walking, stairs, getting in and out of the car, grocery shopping, general mobility, fear of falling.. She ambulates with a single point cane and carbon fiber AFO on the R ankle.  Relevant past medical history and comorbidities include anxiety, bilateral chronic knee pain, arthritis, chronic back pain, GERD, memory changes, sinus bradycardia, urge incontinence, R foot bunionectomy (2013), former smoker.   Patient denies hx of cancer, stroke, seizures, lung problem, major cardiac events, diabetes, unexplained weight loss, changes in bowel or bladder problems, new onset stumbling or dropping things.    Limitations Lifting;Standing;Walking;House hold activities    Diagnostic  tests Chest/Lumbar CT report from 01/14/2020: " IMPRESSION:  1. No CT evidence for acute thoracic, abdominal or pelvic injury.  2. No acute fracture involving the thoracic or lumbar spine.  3. Chronic findings as detailed above."    Patient Stated Goals "to get as much out of it as I can to move better"    Currently in Pain? Yes    Pain Score 5                TREATMENT:    R AFO donned.    Therapeutic exercise: to centralize symptoms and improve ROM, strength, muscular endurance, and activity tolerance required for successful completion of functional activities.  - Ambulation around clinic  with [facility] for distance in 6 minutes (400). For improved lower  extremity mobility, muscular endurance, and weightbearing activity tolerance; and to induce the analgesic effect of aerobic exercise, stimulate improved joint nutrition, and prepare body structures and systems for following interventions. x 6  minutes. Required SBA assistance for safety. - semi-standing cross march while leaning on plinth, 3x10 each side. (Patient requested not to do usual foot tapping exercise - "because I always do that one" - hooklying bridge, 1x3 (discontinued due to R knee pain - supine bridge/glute set with bolster under distal thigh, 3x10 with instructions/counting for 5 second hold. Patient continues to complain of R knee pain and has limited effort in R glute activation.  - sidelying R hip clam, 3x10 - sidelying R hip abduction AAROM, 3x10 - supine to prone to sit with min A - prone hip extension with soft bolster under hips: 1x10 R  AAROM, 1x10 L AROM. Patient complains of belly pain from bolster and requests to discontinue.  - ambulation ~ 100 feet to vehicle with CGA using SPC.   Pt required multimodal cuing for proper technique and to facilitate improved neuromuscular control, strength, range of motion, and functional ability resulting in improved performance and form. Required close supervision to minA  for safety during standing activities with occasional unsteadiness or stumble that patient was able to recover from with step strategy or by grabbing a nearby wall/object.     PT Education - 04/30/21 1456     Education Details exercise purpose/form. POC, R knee pain, TKA    Person(s) Educated Patient    Methods Explanation;Demonstration;Tactile cues;Verbal cues    Comprehension Verbalized understanding;Returned demonstration;Verbal cues required;Tactile cues required;Need further instruction              PT Short Term Goals - 03/05/21 1002       PT SHORT TERM GOAL #1   Title Be independent with initial home exercise program for self-management of symptoms.     Baseline To be initiated at visit 2 as appropriate (04/03/2020); provided but not participating (04/24/2020); participating in ambulation and sit <> stand (05/07/2020; 06/26/2020); participating in ambulation practice at home (10/02/2020); states she has been doing a lot of walking to/from/around hospitals (03/05/2021);    Time 2    Period Weeks    Status Partially Met    Target Date 04/17/20               PT Long Term Goals - 03/05/21 1002       PT LONG TERM GOAL #1   Title Be independent with a long-term home exercise program for self-management of symptoms.    Baseline to be updated at visit 2 as appropriate (04/03/2020); partially participating (05/07/2020; 06/26/2020; 10/02/2020); working on walking at home and climbing steps at  home sit <> stands participating regularly (10/31/2020); continues to walk regularly (12/18/2020); states she has been doing a lot of walking to/from/around hospitals (03/04/2021);    Time 12    Period Weeks    Status On-going   TARGET DATE FOR ALL LONG TERM GOALS: 06/26/2020. TARGET DATE FOR UNMET GOALS UPDATED TO 09/18/2020. UPDATED TO 12/25/2020. UPDATED TO 03/12/2021. UPDATED TO 05/27/2021.   Target Date 05/27/21      PT LONG TERM GOAL #2   Title Demonstrate improved FOTO score to equal or greater than 56 by visit #20 demonstrate improvement in overall condition and self-reported functional ability.    Baseline 48 (04/03/2020); 45 (05/07/2020); 45 (06/26/2020); 46 (10/02/20); 59 (10/31/2020); 45 (12/18/2020); 40 (03/04/2021);    Time 12    Period Weeks    Status On-going      PT LONG TERM GOAL #3   Title Patient will improve her 10 MWT speed with her SPC to at least 1.2 m/s to promote better community ambulation and safe crossing of streets.    Baseline to be measured visit 2 (04/03/2020); 0.54 meters/second with SPC and R AFO (04/16/2020): 0.6 meters/second with SPC (05/07/2020);  0.55 meters/second with SPC (06/26/2020); 0.49 m/sec with SPC and AFO (10/02/2020); 0.64 m/s with  SPC in right UE, CGA  for safety (10/31/2020); 0.67 m/s with SPC in right UE, no AFO, CGA  for safety (no stumbles) (12/18/2020); 0.59 m/s with SPC in left UE, CGA  for safety (03/04/2021);    Time 12    Period Weeks    Status On-going      PT LONG TERM GOAL #4   Title Patient will improve ABC score by 13 percentage points to demonstrate improved self-reported balance.    Baseline 23.8% (04/03/2020); 32.5% (05/07/2020); 26.3% (06/26/2020); 23.8% (10/02/2020); 35% (10/31/2020); 38.1% (12/18/2020); 33.1% (03/04/2021);    Time 12    Period Weeks    Status On-going      PT LONG TERM GOAL #5   Title Patient will complete 5 Times Sit to Stand test from chair height without UE support in equal or less than 14 seconds to improve B LE power and strength for transfers and improved mobility, and demonstrate decreased fall risk (threshold between 12 and 15 for increased fall risk).    Baseline 28 seconds with great difficulty from 18.5 inch plinth with B UE support on mat and knees. Very unstable (04/03/2020); 18 seconds with moderate difficulty from 18.5 inch plinth with B UE support on mat. Mildly unstable. (05/07/2020); 18 seconds from same surface with similar limitations (06/26/2020); 27 seconds with moderate difficulty from 18.5 inch plinth with B UE support on mat. Mildly unstable and did not stand up fully last rep. Limited by left glute pain (10/02/2020); 14.28 seconds with B UE support from 18.5 inch plinth with B UE support on mat (10/31/2020); 13.9 seconds with B UE support from 18.5 inch plinth with B UE support on mat. Mildly unstable and did not stand up fully 1st rep. Complains of right knee pain but demonstrates improved confidence (12/18/2020); 15.78 seconds from 18.5 inch plinth with B UE support on mat. Mildly unstable and did not stand up fully 1st rep (03/04/2021);    Time 12    Period Weeks    Status Partially Met      PT LONG TERM GOAL #6   Title Pateint will improve 6 Minute Walk Test distance to  equal or greater than 1000 feet with LRAD to demonstrate improved activity tolerance  and endurance for community mobility and participation.    Baseline to be tested visit 2 (04/04/2019); 475 feet with SPC with SBA. (04/16/2020); 562 feet with SPC and CGA (05/07/2020); 454 feet with SPC and CGA and at least two stumbles (06/26/2020); 605 feet with SPC in left UE and one stumble (10/02/2020); 680 feet with SPC in left UE, no AFO. CGA-minA to prevent falls from several stumbles when pt caught R toe on floor. (10/31/2020); 656 feet with SPC in R UE, no AFO. CGA-minA to prevent falls from 3 stumbles when pt caught R toe on floor (12/18/2020); 691 feet with SPC in alternating UE. CGA-minA to prevent falls from 1 stumbles when pt caught R toe on floor (03/04/2021);    Time 12    Period Weeks    Status Partially Met                   Plan - 04/30/21 1454     Clinical Impression Statement Patient was very limited this session by R knee pain, R LE weakness, and overall fatigue and weakness. Exercises were modified to accommodate pain and desire for more novel exercises. Patient required high level of encouragement to participate and continued to complain of R knee pain. Patient discussed with PT that she may not continue PT until she has arranged a TKA because she is so limited from her R knee problems. Patient is at risk for functional decline and is having difficulty participating effectively in PT due to R knee pain and generalized fatigue. She would benefit from consultation with medical provider to further address her knee pain. She would benefit from continuing to participate in exercise to improve her overall strength and preparation for a surgery and recovery. She demonstrates deconditioned state in her left LE and B UE. Patient would benefit from continued management of limiting condition by skilled physical therapist to address remaining impairments and functional limitations to work towards stated goals  and return to PLOF or maximal functional independence.    Personal Factors and Comorbidities Age;Comorbidity 3+;Education;Past/Current Experience;Fitness;Time since onset of injury/illness/exacerbation;Social Background    Comorbidities Relevant past medical history and comorbidities include anxiety, bilateral chronic knee pain, arthritis, chronic back pain, GERD, memory changes, sinus bradycardia, urge incontinence, R foot bunionectomy (2013), former smoker.    Examination-Activity Limitations Bed Mobility;Lift;Squat;Locomotion Level;Stand;Caring for Others;Carry;Transfers;Dressing    Examination-Participation Restrictions Laundry;Church;Cleaning;Community Activity;Meal Prep;Driving;Yard Work    [employer] Evolving/Moderate complexity    Rehab Potential Fair    PT Frequency 2x / week    PT Duration 12 weeks    PT Treatment/Interventions ADLs/Self Care Home Management;Aquatic Therapy;Cryotherapy;Moist Heat;Functional mobility training;Stair training;Gait training;DME Instruction;Therapeutic activities;Therapeutic exercise;Balance training;Orthotic Fit/Training;Neuromuscular re-education;Patient/family education;Manual techniques;Passive range of motion;Dry needling;Electrical Stimulation;Energy conservation;Joint Manipulations;Spinal Manipulations;Taping;Splinting    PT Next Visit Plan strengthening/balance exercises, aquatic therapy    PT Home Exercise Plan Medbridge code [email or web address]    Consulted and Agree with Plan of Care Patient             Patient will benefit from skilled therapeutic intervention in order to improve the following deficits and impairments:  Abnormal gait, Decreased knowledge of use of DME, Impaired sensation, Improper body mechanics, Pain, Decreased coordination, Decreased mobility, Impaired tone, Postural dysfunction, Decreased activity tolerance, Decreased endurance, Decreased range of motion, Decreased strength, Impaired perceived functional ability,  Difficulty walking, Decreased balance  Visit Diagnosis: Difficulty in walking, not elsewhere classified  Muscle weakness (generalized)  Other symptoms and signs involving the musculoskeletal system  Bilateral low back  pain without sciatica, unspecified chronicity  Unsteadiness on feet  Right knee pain, unspecified chronicity  Left knee pain, unspecified chronicity     Problem List Patient Active Problem List   Diagnosis Date Noted   Chronic back pain 08/07/2019   Depression 08/07/2019   Near syncope 08/06/2019   Sinus bradycardia 08/06/2019   Weakness 11/03/2012    Everlean Alstrom. Graylon Good, PT, DPT 04/30/21, 2:57 PM   [facility] [address], [place], [address] Phone: [phone]   Fax:  [phone]  Name: [name] MRN: [id number] Date of Birth: 10-29-1945

## 2021-05-01 DIAGNOSIS — M170 Bilateral primary osteoarthritis of knee: Secondary | ICD-10-CM | POA: Diagnosis not present

## 2021-05-01 DIAGNOSIS — M1120 Other chondrocalcinosis, unspecified site: Secondary | ICD-10-CM | POA: Diagnosis not present

## 2021-05-01 DIAGNOSIS — R5383 Other fatigue: Secondary | ICD-10-CM | POA: Diagnosis not present

## 2021-05-01 DIAGNOSIS — Z6829 Body mass index (BMI) 29.0-29.9, adult: Secondary | ICD-10-CM | POA: Diagnosis not present

## 2021-05-01 DIAGNOSIS — M2550 Pain in unspecified joint: Secondary | ICD-10-CM | POA: Diagnosis not present

## 2021-05-01 DIAGNOSIS — M0600 Rheumatoid arthritis without rheumatoid factor, unspecified site: Secondary | ICD-10-CM | POA: Diagnosis not present

## 2021-05-01 DIAGNOSIS — E663 Overweight: Secondary | ICD-10-CM | POA: Diagnosis not present

## 2021-05-07 ENCOUNTER — Ambulatory Visit: Payer: Medicare Other | Admitting: Physical Therapy

## 2021-05-08 DIAGNOSIS — R7612 Nonspecific reaction to cell mediated immunity measurement of gamma interferon antigen response without active tuberculosis: Secondary | ICD-10-CM | POA: Diagnosis not present

## 2021-05-13 DIAGNOSIS — H02832 Dermatochalasis of right lower eyelid: Secondary | ICD-10-CM | POA: Diagnosis not present

## 2021-05-13 DIAGNOSIS — H11823 Conjunctivochalasis, bilateral: Secondary | ICD-10-CM | POA: Diagnosis not present

## 2021-05-13 DIAGNOSIS — R7612 Nonspecific reaction to cell mediated immunity measurement of gamma interferon antigen response without active tuberculosis: Secondary | ICD-10-CM | POA: Diagnosis not present

## 2021-05-13 DIAGNOSIS — H02835 Dermatochalasis of left lower eyelid: Secondary | ICD-10-CM | POA: Diagnosis not present

## 2021-05-13 DIAGNOSIS — H5711 Ocular pain, right eye: Secondary | ICD-10-CM | POA: Diagnosis not present

## 2021-05-13 DIAGNOSIS — Z79899 Other long term (current) drug therapy: Secondary | ICD-10-CM | POA: Diagnosis not present

## 2021-05-13 DIAGNOSIS — H04201 Unspecified epiphora, right lacrimal gland: Secondary | ICD-10-CM | POA: Diagnosis not present

## 2021-05-14 ENCOUNTER — Ambulatory Visit: Payer: Medicare Other | Admitting: Physical Therapy

## 2021-05-15 ENCOUNTER — Ambulatory Visit: Payer: Medicare Other | Admitting: Physical Therapy

## 2021-05-15 ENCOUNTER — Encounter: Payer: Self-pay | Admitting: Physical Therapy

## 2021-05-15 ENCOUNTER — Other Ambulatory Visit: Payer: Self-pay

## 2021-05-15 DIAGNOSIS — R262 Difficulty in walking, not elsewhere classified: Secondary | ICD-10-CM

## 2021-05-15 DIAGNOSIS — M25561 Pain in right knee: Secondary | ICD-10-CM

## 2021-05-15 DIAGNOSIS — R29898 Other symptoms and signs involving the musculoskeletal system: Secondary | ICD-10-CM

## 2021-05-15 DIAGNOSIS — R2681 Unsteadiness on feet: Secondary | ICD-10-CM

## 2021-05-15 DIAGNOSIS — M545 Low back pain, unspecified: Secondary | ICD-10-CM | POA: Diagnosis not present

## 2021-05-15 DIAGNOSIS — M6281 Muscle weakness (generalized): Secondary | ICD-10-CM | POA: Diagnosis not present

## 2021-05-15 DIAGNOSIS — M25562 Pain in left knee: Secondary | ICD-10-CM

## 2021-05-15 NOTE — Therapy (Signed)
[facility] [address] [place], [address] Phone: [phone]   Fax:  [phone]  Physical Therapy Treatment  Patient Details  Name: [name] MRN: [id number] Date of Birth: 1945-06-28 Referring Provider (PT): Erline Hau, [place]   Encounter Date: 05/15/2021   PT End of Session - 05/15/21 1110     Visit Number 46    Number of Visits 38    Date for PT Re-Evaluation 05/27/21    Authorization Type Medicare reporting period from 10/31/2020    Progress Note Due on Visit 64    PT Start Time 1037    PT Stop Time 1115    PT Time Calculation (min) 38 min    Equipment Utilized During Treatment Gait belt    Activity Tolerance Patient limited by fatigue;Patient limited by pain    Behavior During Therapy [facility] for tasks assessed/performed             Past Medical History:  Diagnosis Date   Anxiety    Arthritis    Bilateral chronic knee pain    Chronic back pain    Depression    GERD (gastroesophageal reflux disease)    Insomnia    Memory changes    Sinus bradycardia    Urge incontinence     Past Surgical History:  Procedure Laterality Date   ABDOMINAL HYSTERECTOMY  1995   back sugery     lumbar   BIOPSY THYROID     benign goiter   BUNIONECTOMY  2013   rt foot   COLONOSCOPY     MUSCLE BIOPSY Left 10/03/2012   Procedure: LEFT QUADRICEP MUSCLE BIOPSY;  Surgeon: Odis Hollingshead, MD;  Location: [place];  Service: General;  Laterality: Left;   NECK SURGERY  2010   cerv disc fused     There were no vitals filed for this visit.   Subjective Assessment - 05/15/21 1049     Subjective Patient arrives on [facility] with R AFO donned. She states she has R knee pain of 8-9/10 upon arrival. States she is going to see the orthopedist in about 2 weeks. She saw her rhumatologist who gave her an injection for pain in her left hip. States it was a steroid for arthritis. She underwent eye  proceducre R ight inferior fornix conjunctivochalasis excision two days ago. she did not read her post-procedure instructions. Patient forgot her PT appointment scheduled yesterday but was able to reschedule to today.    Pertinent History Patient is a 76 y.o. female who presents to outpatient physical therapy with a referral for medical diagnosis thoracic spine pain, lumbar spine fusion, generalized weakness. This patient's chief complaints consist of weakness and difficulty moving R leg leading to the following functional deficits: increased difficulty with daily tasks and mobility including walking, stairs, getting in and out of the car, grocery shopping, general mobility, fear of falling.. She ambulates with a single point cane and carbon fiber AFO on the R ankle.  Relevant past medical history and comorbidities include anxiety, bilateral chronic knee pain, arthritis, chronic back pain, GERD, memory changes, sinus bradycardia, urge incontinence, R foot bunionectomy (2013), former smoker.   Patient denies hx of cancer, stroke, seizures, lung problem, major cardiac events, diabetes, unexplained weight loss, changes in bowel or bladder problems, new onset stumbling or dropping things.    Limitations Lifting;Standing;Walking;House hold activities    Diagnostic tests Chest/Lumbar CT report from 01/14/2020: " IMPRESSION:  1. No  CT evidence for acute thoracic, abdominal or pelvic injury.  2. No acute fracture involving the thoracic or lumbar spine.  3. Chronic findings as detailed above."    Patient Stated Goals "to get as much out of it as I can to move better"    Currently in Pain? Yes    Pain Score 9                TREATMENT:    R AFO donned.    Therapeutic exercise: to centralize symptoms and improve ROM, strength, muscular endurance, and activity tolerance required for successful completion of functional activities.  - NuStep level 1 using bilateral upper and lower extremities. Seat/handle  setting 11/11. For improved extremity mobility, muscular endurance, and activity tolerance; and to induce the analgesic effect of aerobic exercise, stimulate improved joint nutrition, and prepare body structures and systems for following interventions. x 11.5  minutes. Average SPM = 44. -seated hamstring ball rolls on basket ball, 3x20 each side.  - seated B hip abduction, on edge of chair with knees extended and furniture sliders under heels. Upper body braced arms of chair. 3x20 with green theraband tied at distal thighs.  - semi-standing cross march while leaning on plinth, 3x10 each side.  - bent over hip extension with forearms resting on high plinth, 3x10 each side.  - ambulation ~ 50 feet to outside of clinic with SBA using SPC (patient requests to walk the rest of the way to her vehicle by herself).   Pt required multimodal cuing for proper technique and to facilitate improved neuromuscular control, strength, range of motion, and functional ability resulting in improved performance and form. Required close supervision to minA  for safety during standing activities with occasional unsteadiness or stumble that patient was able to recover from with step strategy or by grabbing a nearby wall/object.      PT Education - 05/15/21 1111     Education Details exercise purpose/form.    Person(s) Educated Patient    Methods Explanation;Demonstration;Tactile cues;Verbal cues    Comprehension Verbalized understanding;Returned demonstration;Verbal cues required;Tactile cues required;Need further instruction              PT Short Term Goals - 03/05/21 1002       PT SHORT TERM GOAL #1   Title Be independent with initial home exercise program for self-management of symptoms.    Baseline To be initiated at visit 2 as appropriate (04/03/2020); provided but not participating (04/24/2020); participating in ambulation and sit <> stand (05/07/2020; 06/26/2020); participating in ambulation practice at home  (10/02/2020); states she has been doing a lot of walking to/from/around hospitals (03/05/2021);    Time 2    Period Weeks    Status Partially Met    Target Date 04/17/20               PT Long Term Goals - 03/05/21 1002       PT LONG TERM GOAL #1   Title Be independent with a long-term home exercise program for self-management of symptoms.    Baseline to be updated at visit 2 as appropriate (04/03/2020); partially participating (05/07/2020; 06/26/2020; 10/02/2020); working on walking at home and climbing steps at home sit <> stands participating regularly (10/31/2020); continues to walk regularly (12/18/2020); states she has been doing a lot of walking to/from/around hospitals (03/04/2021);    Time 12    Period Weeks    Status On-going   TARGET DATE FOR ALL LONG TERM GOALS: 06/26/2020. TARGET DATE FOR  UNMET GOALS UPDATED TO 09/18/2020. UPDATED TO 12/25/2020. UPDATED TO 03/12/2021. UPDATED TO 05/27/2021.   Target Date 05/27/21      PT LONG TERM GOAL #2   Title Demonstrate improved FOTO score to equal or greater than 56 by visit #20 demonstrate improvement in overall condition and self-reported functional ability.    Baseline 48 (04/03/2020); 45 (05/07/2020); 45 (06/26/2020); 46 (10/02/20); 59 (10/31/2020); 45 (12/18/2020); 40 (03/04/2021);    Time 12    Period Weeks    Status On-going      PT LONG TERM GOAL #3   Title Patient will improve her 10 MWT speed with her SPC to at least 1.2 m/s to promote better community ambulation and safe crossing of streets.    Baseline to be measured visit 2 (04/03/2020); 0.54 meters/second with SPC and R AFO (04/16/2020): 0.6 meters/second with SPC (05/07/2020);  0.55 meters/second with SPC (06/26/2020); 0.49 m/sec with SPC and AFO (10/02/2020); 0.64 m/s with SPC in right UE, CGA  for safety (10/31/2020); 0.67 m/s with SPC in right UE, no AFO, CGA  for safety (no stumbles) (12/18/2020); 0.59 m/s with SPC in left UE, CGA  for safety (03/04/2021);    Time 12    Period Weeks     Status On-going      PT LONG TERM GOAL #4   Title Patient will improve ABC score by 13 percentage points to demonstrate improved self-reported balance.    Baseline 23.8% (04/03/2020); 32.5% (05/07/2020); 26.3% (06/26/2020); 23.8% (10/02/2020); 35% (10/31/2020); 38.1% (12/18/2020); 33.1% (03/04/2021);    Time 12    Period Weeks    Status On-going      PT LONG TERM GOAL #5   Title Patient will complete 5 Times Sit to Stand test from chair height without UE support in equal or less than 14 seconds to improve B LE power and strength for transfers and improved mobility, and demonstrate decreased fall risk (threshold between 12 and 15 for increased fall risk).    Baseline 28 seconds with great difficulty from 18.5 inch plinth with B UE support on mat and knees. Very unstable (04/03/2020); 18 seconds with moderate difficulty from 18.5 inch plinth with B UE support on mat. Mildly unstable. (05/07/2020); 18 seconds from same surface with similar limitations (06/26/2020); 27 seconds with moderate difficulty from 18.5 inch plinth with B UE support on mat. Mildly unstable and did not stand up fully last rep. Limited by left glute pain (10/02/2020); 14.28 seconds with B UE support from 18.5 inch plinth with B UE support on mat (10/31/2020); 13.9 seconds with B UE support from 18.5 inch plinth with B UE support on mat. Mildly unstable and did not stand up fully 1st rep. Complains of right knee pain but demonstrates improved confidence (12/18/2020); 15.78 seconds from 18.5 inch plinth with B UE support on mat. Mildly unstable and did not stand up fully 1st rep (03/04/2021);    Time 12    Period Weeks    Status Partially Met      PT LONG TERM GOAL #6   Title Pateint will improve 6 Minute Walk Test distance to equal or greater than 1000 feet with LRAD to demonstrate improved activity tolerance and endurance for community mobility and participation.    Baseline to be tested visit 2 (04/04/2019); 475 feet with SPC with SBA.  (04/16/2020); 562 feet with SPC and CGA (05/07/2020); 454 feet with SPC and CGA and at least two stumbles (06/26/2020); 605 feet with SPC in left UE and one  stumble (10/02/2020); 680 feet with SPC in left UE, no AFO. CGA-minA to prevent falls from several stumbles when pt caught R toe on floor. (10/31/2020); 656 feet with SPC in R UE, no AFO. CGA-minA to prevent falls from 3 stumbles when pt caught R toe on floor (12/18/2020); 691 feet with SPC in alternating UE. CGA-minA to prevent falls from 1 stumbles when pt caught R toe on floor (03/04/2021);    Time 12    Period Weeks    Status Partially Met                   Plan - 05/15/21 1104     Clinical Impression Statement Patient arrives on time today but was limited by pain, quick fatigue, and recent surgery for the right eye. Today she reported feeling more easily exhausted than usual with PT. She requires significant encouragement for participation and continues to struggle with difficulty with mobility due to R knee pain and R leg weakness. Plan to continue with strengthening as tolerated. Patient would benefit from continued management of limiting condition by skilled physical therapist to address remaining impairments and functional limitations to work towards stated goals and return to PLOF or maximal functional independence.    Personal Factors and Comorbidities Age;Comorbidity 3+;Education;Past/Current Experience;Fitness;Time since onset of injury/illness/exacerbation;Social Background    Comorbidities Relevant past medical history and comorbidities include anxiety, bilateral chronic knee pain, arthritis, chronic back pain, GERD, memory changes, sinus bradycardia, urge incontinence, R foot bunionectomy (2013), former smoker.    Examination-Activity Limitations Bed Mobility;Lift;Squat;Locomotion Level;Stand;Caring for Others;Carry;Transfers;Dressing    Examination-Participation Restrictions Laundry;Church;Cleaning;Community Activity;Meal  Prep;Driving;Yard Work    [employer] Evolving/Moderate complexity    Rehab Potential Fair    PT Frequency 2x / week    PT Duration 12 weeks    PT Treatment/Interventions ADLs/Self Care Home Management;Aquatic Therapy;Cryotherapy;Moist Heat;Functional mobility training;Stair training;Gait training;DME Instruction;Therapeutic activities;Therapeutic exercise;Balance training;Orthotic Fit/Training;Neuromuscular re-education;Patient/family education;Manual techniques;Passive range of motion;Dry needling;Electrical Stimulation;Energy conservation;Joint Manipulations;Spinal Manipulations;Taping;Splinting    PT Next Visit Plan strengthening/balance exercises, aquatic therapy    PT Home Exercise Plan Medbridge code [id number]    Consulted and Agree with Plan of Care Patient             Patient will benefit from skilled therapeutic intervention in order to improve the following deficits and impairments:  Abnormal gait, Decreased knowledge of use of DME, Impaired sensation, Improper body mechanics, Pain, Decreased coordination, Decreased mobility, Impaired tone, Postural dysfunction, Decreased activity tolerance, Decreased endurance, Decreased range of motion, Decreased strength, Impaired perceived functional ability, Difficulty walking, Decreased balance  Visit Diagnosis: Difficulty in walking, not elsewhere classified  Muscle weakness (generalized)  Other symptoms and signs involving the musculoskeletal system  Bilateral low back pain without sciatica, unspecified chronicity  Unsteadiness on feet  Right knee pain, unspecified chronicity  Left knee pain, unspecified chronicity     Problem List Patient Active Problem List   Diagnosis Date Noted   Chronic back pain 08/07/2019   Depression 08/07/2019   Near syncope 08/06/2019   Sinus bradycardia 08/06/2019   Weakness 11/03/2012   Everlean Alstrom. Graylon Good, PT, DPT 05/15/21, 11:11 AM   [facility] [address] [place], [address] Phone: [phone]   Fax:  [phone]  Name: [name] MRN: [id number] Date of Birth: Aug 31, 1945

## 2021-05-19 ENCOUNTER — Ambulatory Visit: Payer: Medicare Other | Admitting: Physical Therapy

## 2021-05-21 ENCOUNTER — Ambulatory Visit: Payer: Medicare Other | Attending: Physician Assistant | Admitting: Physical Therapy

## 2021-05-21 ENCOUNTER — Other Ambulatory Visit: Payer: Self-pay

## 2021-05-21 DIAGNOSIS — M545 Low back pain, unspecified: Secondary | ICD-10-CM | POA: Diagnosis not present

## 2021-05-21 DIAGNOSIS — R2681 Unsteadiness on feet: Secondary | ICD-10-CM | POA: Insufficient documentation

## 2021-05-21 DIAGNOSIS — R262 Difficulty in walking, not elsewhere classified: Secondary | ICD-10-CM | POA: Diagnosis not present

## 2021-05-21 DIAGNOSIS — M25561 Pain in right knee: Secondary | ICD-10-CM | POA: Insufficient documentation

## 2021-05-21 DIAGNOSIS — M25562 Pain in left knee: Secondary | ICD-10-CM | POA: Insufficient documentation

## 2021-05-21 DIAGNOSIS — R29898 Other symptoms and signs involving the musculoskeletal system: Secondary | ICD-10-CM | POA: Diagnosis not present

## 2021-05-21 DIAGNOSIS — M6281 Muscle weakness (generalized): Secondary | ICD-10-CM | POA: Diagnosis not present

## 2021-05-21 NOTE — Therapy (Addendum)
[facility] [address] [place], [address] Phone: [phone]   Fax:  [phone]  Physical Therapy Treatment  Patient Details  Name: [name] MRN: [id number] Date of Birth: Feb 03, 1946 Referring Provider (PT): Erline Hau, [place]   Encounter Date: 05/21/2021   PT End of Session - 05/21/21 1318     Visit Number 47    Number of Visits 60    Date for PT Re-Evaluation 05/27/21    Authorization Type Medicare reporting period from 10/31/2020    Progress Note Due on Visit 29    PT Start Time 1315    PT Stop Time 1340    PT Time Calculation (min) 25 min    Equipment Utilized During Treatment Gait belt    Activity Tolerance Patient limited by fatigue;Patient limited by pain    Behavior During Therapy [facility] for tasks assessed/performed             Past Medical History:  Diagnosis Date   Anxiety    Arthritis    Bilateral chronic knee pain    Chronic back pain    Depression    GERD (gastroesophageal reflux disease)    Insomnia    Memory changes    Sinus bradycardia    Urge incontinence     Past Surgical History:  Procedure Laterality Date   ABDOMINAL HYSTERECTOMY  1995   back sugery     lumbar   BIOPSY THYROID     benign goiter   BUNIONECTOMY  2013   rt foot   COLONOSCOPY     MUSCLE BIOPSY Left 10/03/2012   Procedure: LEFT QUADRICEP MUSCLE BIOPSY;  Surgeon: Odis Hollingshead, MD;  Location: [place];  Service: General;  Laterality: Left;   NECK SURGERY  2010   cerv disc fused     There were no vitals filed for this visit.   Subjective Assessment - 05/21/21 1313     Subjective Patient arrives using SPC and R AFO donned. She states her R knee pain is "the norm" and "I ain't complaining." Reports 4-5/10 pain at the right anterior knee. States her R eye is getting better after surgery last week. She has not been doing any exercises at home. She states she is supposed to be  "kind of low with this eye." She spoke to her eye doctor and he said she can exercise but nothing that is "real straining"    Pertinent History Patient is a 76 y.o. female who presents to outpatient physical therapy with a referral for medical diagnosis thoracic spine pain, lumbar spine fusion, generalized weakness. This patient's chief complaints consist of weakness and difficulty moving R leg leading to the following functional deficits: increased difficulty with daily tasks and mobility including walking, stairs, getting in and out of the car, grocery shopping, general mobility, fear of falling.. She ambulates with a single point cane and carbon fiber AFO on the R ankle.  Relevant past medical history and comorbidities include anxiety, bilateral chronic knee pain, arthritis, chronic back pain, GERD, memory changes, sinus bradycardia, urge incontinence, R foot bunionectomy (2013), former smoker.   Patient denies hx of cancer, stroke, seizures, lung problem, major cardiac events, diabetes, unexplained weight loss, changes in bowel or bladder problems, new onset stumbling or dropping things.    Limitations Lifting;Standing;Walking;House hold activities    Diagnostic tests Chest/Lumbar CT report from 01/14/2020: " IMPRESSION:  1. No CT evidence for acute thoracic, abdominal or  pelvic injury.  2. No acute fracture involving the thoracic or lumbar spine.  3. Chronic findings as detailed above."    Patient Stated Goals "to get as much out of it as I can to move better"    Currently in Pain? Yes    Pain Score 5                 TREATMENT:    R AFO donned.    Therapeutic exercise: to centralize symptoms and improve ROM, strength, muscular endurance, and activity tolerance required for successful completion of functional activities.  - NuStep level 1 using bilateral upper and lower extremities. Seat/handle setting 11/11. For improved extremity mobility, muscular endurance, and activity tolerance; and  to induce the analgesic effect of aerobic exercise, stimulate improved joint nutrition, and prepare body structures and systems for following interventions. x 5  minutes. Average SPM = 53 - seated marching (unable) - marching while leaning on wall with SPC in R UE, 3x20 each side.  - seated hip abduction with RTB around knees, 2x10 with 10 second hold.  - seated long arc quad AROM, 1x5 each side with 10 second hold - Education on HEP including handout    Pt required multimodal cuing for proper technique and to facilitate improved neuromuscular control, strength, range of motion, and functional ability resulting in improved performance and form. Required close supervision to minA  for safety during standing activities with occasional unsteadiness or stumble that patient was able to recover from with step strategy or by grabbing a nearby wall/object.    HOME EXERCISE PROGRAM Access Code: [email or web address] URL: [email or web address] Date: 05/21/2021 Prepared by: Rosita Kea  Exercises Standing Pelvic Tilts with March At [facility] With Pelvic Floor Contraction - 1 x daily - 3-5 x weekly - 3 sets - 20 reps Seated Hip Abduction with Resistance - 3-5 x weekly - 3 sets - 10 reps - 10 seconds hold Seated Long Arc Quad - 3-5 x weekly - 3 sets - 10 reps - 10 seconds hold      PT Education - 05/21/21 1318     Education Details exercise purpose/form    Person(s) Educated Patient    Methods Explanation;Demonstration;Tactile cues;Verbal cues    Comprehension Verbalized understanding;Returned demonstration;Verbal cues required;Tactile cues required;Need further instruction              PT Short Term Goals - 03/05/21 1002       PT SHORT TERM GOAL #1   Title Be independent with initial home exercise program for self-management of symptoms.    Baseline To be initiated at visit 2 as appropriate (04/03/2020); provided but not participating (04/24/2020); participating in ambulation and sit <> stand  (05/07/2020; 06/26/2020); participating in ambulation practice at home (10/02/2020); states she has been doing a lot of walking to/from/around hospitals (03/05/2021);    Time 2    Period Weeks    Status Partially Met    Target Date 04/17/20               PT Long Term Goals - 03/05/21 1002       PT LONG TERM GOAL #1   Title Be independent with a long-term home exercise program for self-management of symptoms.    Baseline to be updated at visit 2 as appropriate (04/03/2020); partially participating (05/07/2020; 06/26/2020; 10/02/2020); working on walking at home and climbing steps at home sit <> stands participating regularly (10/31/2020); continues to walk regularly (12/18/2020); states she has been doing a lot of walking  to/from/around hospitals (03/04/2021);    Time 12    Period Weeks    Status On-going   TARGET DATE FOR ALL LONG TERM GOALS: 06/26/2020. TARGET DATE FOR UNMET GOALS UPDATED TO 09/18/2020. UPDATED TO 12/25/2020. UPDATED TO 03/12/2021. UPDATED TO 05/27/2021.   Target Date 05/27/21      PT LONG TERM GOAL #2   Title Demonstrate improved FOTO score to equal or greater than 56 by visit #20 demonstrate improvement in overall condition and self-reported functional ability.    Baseline 48 (04/03/2020); 45 (05/07/2020); 45 (06/26/2020); 46 (10/02/20); 59 (10/31/2020); 45 (12/18/2020); 40 (03/04/2021);    Time 12    Period Weeks    Status On-going      PT LONG TERM GOAL #3   Title Patient will improve her 10 MWT speed with her SPC to at least 1.2 m/s to promote better community ambulation and safe crossing of streets.    Baseline to be measured visit 2 (04/03/2020); 0.54 meters/second with SPC and R AFO (04/16/2020): 0.6 meters/second with SPC (05/07/2020);  0.55 meters/second with SPC (06/26/2020); 0.49 m/sec with SPC and AFO (10/02/2020); 0.64 m/s with SPC in right UE, CGA  for safety (10/31/2020); 0.67 m/s with SPC in right UE, no AFO, CGA  for safety (no stumbles) (12/18/2020); 0.59 m/s with SPC in left  UE, CGA  for safety (03/04/2021);    Time 12    Period Weeks    Status On-going      PT LONG TERM GOAL #4   Title Patient will improve ABC score by 13 percentage points to demonstrate improved self-reported balance.    Baseline 23.8% (04/03/2020); 32.5% (05/07/2020); 26.3% (06/26/2020); 23.8% (10/02/2020); 35% (10/31/2020); 38.1% (12/18/2020); 33.1% (03/04/2021);    Time 12    Period Weeks    Status On-going      PT LONG TERM GOAL #5   Title Patient will complete 5 Times Sit to Stand test from chair height without UE support in equal or less than 14 seconds to improve B LE power and strength for transfers and improved mobility, and demonstrate decreased fall risk (threshold between 12 and 15 for increased fall risk).    Baseline 28 seconds with great difficulty from 18.5 inch plinth with B UE support on mat and knees. Very unstable (04/03/2020); 18 seconds with moderate difficulty from 18.5 inch plinth with B UE support on mat. Mildly unstable. (05/07/2020); 18 seconds from same surface with similar limitations (06/26/2020); 27 seconds with moderate difficulty from 18.5 inch plinth with B UE support on mat. Mildly unstable and did not stand up fully last rep. Limited by left glute pain (10/02/2020); 14.28 seconds with B UE support from 18.5 inch plinth with B UE support on mat (10/31/2020); 13.9 seconds with B UE support from 18.5 inch plinth with B UE support on mat. Mildly unstable and did not stand up fully 1st rep. Complains of right knee pain but demonstrates improved confidence (12/18/2020); 15.78 seconds from 18.5 inch plinth with B UE support on mat. Mildly unstable and did not stand up fully 1st rep (03/04/2021);    Time 12    Period Weeks    Status Partially Met      PT LONG TERM GOAL #6   Title Pateint will improve 6 Minute Walk Test distance to equal or greater than 1000 feet with LRAD to demonstrate improved activity tolerance and endurance for community mobility and participation.    Baseline to  be tested visit 2 (04/04/2019); 475 feet with SPC  with SBA. (04/16/2020); 562 feet with SPC and CGA (05/07/2020); 454 feet with SPC and CGA and at least two stumbles (06/26/2020); 605 feet with SPC in left UE and one stumble (10/02/2020); 680 feet with SPC in left UE, no AFO. CGA-minA to prevent falls from several stumbles when pt caught R toe on floor. (10/31/2020); 656 feet with SPC in R UE, no AFO. CGA-minA to prevent falls from 3 stumbles when pt caught R toe on floor (12/18/2020); 691 feet with SPC in alternating UE. CGA-minA to prevent falls from 1 stumbles when pt caught R toe on floor (03/04/2021);    Time 12    Period Weeks    Status Partially Met                   Plan - 05/21/21 1339     Clinical Impression Statement Patient arrived 13 min late so treatment time was limited. Patient was unable to finish all reps of planned exercises. Today's session focused on updating HEP to match patient's current ability. Patient continues to have difficulty with participation due to weakness and R knee pain. She was able to complete today's exercises with good tolerance and ability. Plan to follow up on HEP next session and continue strengthening as tolerated. Patient would benefit from continued management of limiting condition by skilled physical therapist to address remaining impairments and functional limitations to work towards stated goals and return to PLOF or maximal functional independence.    Personal Factors and Comorbidities Age;Comorbidity 3+;Education;Past/Current Experience;Fitness;Time since onset of injury/illness/exacerbation;Social Background    Comorbidities Relevant past medical history and comorbidities include anxiety, bilateral chronic knee pain, arthritis, chronic back pain, GERD, memory changes, sinus bradycardia, urge incontinence, R foot bunionectomy (2013), former smoker.    Examination-Activity Limitations Bed Mobility;Lift;Squat;Locomotion Level;Stand;Caring for  Others;Carry;Transfers;Dressing    Examination-Participation Restrictions Laundry;Church;Cleaning;Community Activity;Meal Prep;Driving;Yard Work    [employer] Evolving/Moderate complexity    Rehab Potential Fair    PT Frequency 2x / week    PT Duration 12 weeks    PT Treatment/Interventions ADLs/Self Care Home Management;Aquatic Therapy;Cryotherapy;Moist Heat;Functional mobility training;Stair training;Gait training;DME Instruction;Therapeutic activities;Therapeutic exercise;Balance training;Orthotic Fit/Training;Neuromuscular re-education;Patient/family education;Manual techniques;Passive range of motion;Dry needling;Electrical Stimulation;Energy conservation;Joint Manipulations;Spinal Manipulations;Taping;Splinting    PT Next Visit Plan strengthening/balance exercises, aquatic therapy    PT Home Exercise Plan Medbridge code [email or web address]    Consulted and Agree with Plan of Care Patient             Patient will benefit from skilled therapeutic intervention in order to improve the following deficits and impairments:  Abnormal gait, Decreased knowledge of use of DME, Impaired sensation, Improper body mechanics, Pain, Decreased coordination, Decreased mobility, Impaired tone, Postural dysfunction, Decreased activity tolerance, Decreased endurance, Decreased range of motion, Decreased strength, Impaired perceived functional ability, Difficulty walking, Decreased balance  Visit Diagnosis: Difficulty in walking, not elsewhere classified  Muscle weakness (generalized)  Other symptoms and signs involving the musculoskeletal system  Bilateral low back pain without sciatica, unspecified chronicity  Unsteadiness on feet  Right knee pain, unspecified chronicity  Left knee pain, unspecified chronicity     Problem List Patient Active Problem List   Diagnosis Date Noted   Chronic back pain 08/07/2019   Depression 08/07/2019   Near syncope 08/06/2019   Sinus bradycardia  08/06/2019   Weakness 11/03/2012    Everlean Alstrom. Graylon Good, PT, DPT 05/21/21, 1:40 PM   [facility] [address], [place], [address] Phone: [phone]  Fax:  [phone]  Name: [name] MRN: [id number] Date of Birth: [birth year]

## 2021-05-28 ENCOUNTER — Encounter: Payer: Self-pay | Admitting: Physical Therapy

## 2021-05-28 ENCOUNTER — Ambulatory Visit: Payer: Medicare Other | Admitting: Physical Therapy

## 2021-05-28 ENCOUNTER — Other Ambulatory Visit: Payer: Self-pay

## 2021-05-28 DIAGNOSIS — M6281 Muscle weakness (generalized): Secondary | ICD-10-CM

## 2021-05-28 DIAGNOSIS — M25561 Pain in right knee: Secondary | ICD-10-CM | POA: Diagnosis not present

## 2021-05-28 DIAGNOSIS — R262 Difficulty in walking, not elsewhere classified: Secondary | ICD-10-CM | POA: Diagnosis not present

## 2021-05-28 DIAGNOSIS — M545 Low back pain, unspecified: Secondary | ICD-10-CM | POA: Diagnosis not present

## 2021-05-28 DIAGNOSIS — R29898 Other symptoms and signs involving the musculoskeletal system: Secondary | ICD-10-CM | POA: Diagnosis not present

## 2021-05-28 DIAGNOSIS — R2681 Unsteadiness on feet: Secondary | ICD-10-CM

## 2021-05-28 DIAGNOSIS — M25562 Pain in left knee: Secondary | ICD-10-CM

## 2021-05-28 NOTE — Therapy (Signed)
[facility] [address], [place], [address] Phone: [phone]   Fax:  [phone]  Physical Therapy Treatment  Patient Details  Name: [name] MRN: [id number] Date of Birth: 12/03/1945 Referring Provider (PT): Erline Hau, [place]   Encounter Date: 05/28/2021    Past Medical History:  Diagnosis Date   Anxiety    Arthritis    Bilateral chronic knee pain    Chronic back pain    Depression    GERD (gastroesophageal reflux disease)    Insomnia    Memory changes    Sinus bradycardia    Urge incontinence     Past Surgical History:  Procedure Laterality Date   ABDOMINAL HYSTERECTOMY  1995   back sugery     lumbar   BIOPSY THYROID     benign goiter   BUNIONECTOMY  2013   rt foot   COLONOSCOPY     MUSCLE BIOPSY Left 10/03/2012   Procedure: LEFT QUADRICEP MUSCLE BIOPSY;  Surgeon: Odis Hollingshead, MD;  Location: [place];  Service: General;  Laterality: Left;   NECK SURGERY  2010   cerv disc fused     There were no vitals filed for this visit.      TREATMENT:    R AFO donned.    Therapeutic exercise: to centralize symptoms and improve ROM, strength, muscular endurance, and activity tolerance required for successful completion of functional activities.  - NuStep level 1 using bilateral upper and lower extremities. Seat/handle setting 11/11. For improved extremity mobility, muscular endurance, and activity tolerance; and to induce the analgesic effect of aerobic exercise, stimulate improved joint nutrition, and prepare body structures and systems for following interventions. x 5  minutes. Average SPM = 53 - seated marching (unable) - marching while leaning on wall with SPC in R UE, 3x20 each side.  - seated hip abduction with RTB around knees, 2x10 with 10 second hold.  - seated long arc quad AROM, 1x5 each side with 10 second hold - Education on HEP including handout     Pt required multimodal cuing for proper technique and to facilitate improved neuromuscular control, strength, range of motion, and functional ability resulting in improved performance and form. Required close supervision to minA  for safety during standing activities with occasional unsteadiness or stumble that patient was able to recover from with step strategy or by grabbing a nearby wall/object.    HOME EXERCISE PROGRAM Access Code: [email or web address] URL: [email or web address] Date: 05/21/2021 Prepared by: Rosita Kea   Exercises Standing Pelvic Tilts with March At [facility] With Pelvic Floor Contraction - 1 x daily - 3-5 x weekly - 3 sets - 20 reps Seated Hip Abduction with Resistance - 3-5 x weekly - 3 sets - 10 reps - 10 seconds hold Seated Long Arc Quad - 3-5 x weekly - 3 sets - 10 reps - 10 seconds hold                           PT Short Term Goals - 03/05/21 1002       PT SHORT TERM GOAL #1   Title Be independent with initial home exercise program for self-management of symptoms.    Baseline To be initiated at visit 2 as appropriate (04/03/2020); provided but not participating (04/24/2020); participating in ambulation and sit <> stand (05/07/2020; 06/26/2020); participating in ambulation practice at home (10/02/2020); states she has been doing a  lot of walking to/from/around hospitals (03/05/2021);    Time 2    Period Weeks    Status Partially Met    Target Date 04/17/20               PT Long Term Goals - 03/05/21 1002       PT LONG TERM GOAL #1   Title Be independent with a long-term home exercise program for self-management of symptoms.    Baseline to be updated at visit 2 as appropriate (04/03/2020); partially participating (05/07/2020; 06/26/2020; 10/02/2020); working on walking at home and climbing steps at home sit <> stands participating regularly (10/31/2020); continues to walk regularly (12/18/2020); states she has been doing a lot of walking  to/from/around hospitals (03/04/2021);    Time 12    Period Weeks    Status On-going   TARGET DATE FOR ALL LONG TERM GOALS: 06/26/2020. TARGET DATE FOR UNMET GOALS UPDATED TO 09/18/2020. UPDATED TO 12/25/2020. UPDATED TO 03/12/2021. UPDATED TO 05/27/2021.   Target Date 05/27/21      PT LONG TERM GOAL #2   Title Demonstrate improved FOTO score to equal or greater than 56 by visit #20 demonstrate improvement in overall condition and self-reported functional ability.    Baseline 48 (04/03/2020); 45 (05/07/2020); 45 (06/26/2020); 46 (10/02/20); 59 (10/31/2020); 45 (12/18/2020); 40 (03/04/2021);    Time 12    Period Weeks    Status On-going      PT LONG TERM GOAL #3   Title Patient will improve her 10 MWT speed with her SPC to at least 1.2 m/s to promote better community ambulation and safe crossing of streets.    Baseline to be measured visit 2 (04/03/2020); 0.54 meters/second with SPC and R AFO (04/16/2020): 0.6 meters/second with SPC (05/07/2020);  0.55 meters/second with SPC (06/26/2020); 0.49 m/sec with SPC and AFO (10/02/2020); 0.64 m/s with SPC in right UE, CGA  for safety (10/31/2020); 0.67 m/s with SPC in right UE, no AFO, CGA  for safety (no stumbles) (12/18/2020); 0.59 m/s with SPC in left UE, CGA  for safety (03/04/2021);    Time 12    Period Weeks    Status On-going      PT LONG TERM GOAL #4   Title Patient will improve ABC score by 13 percentage points to demonstrate improved self-reported balance.    Baseline 23.8% (04/03/2020); 32.5% (05/07/2020); 26.3% (06/26/2020); 23.8% (10/02/2020); 35% (10/31/2020); 38.1% (12/18/2020); 33.1% (03/04/2021);    Time 12    Period Weeks    Status On-going      PT LONG TERM GOAL #5   Title Patient will complete 5 Times Sit to Stand test from chair height without UE support in equal or less than 14 seconds to improve B LE power and strength for transfers and improved mobility, and demonstrate decreased fall risk (threshold between 12 and 15 for increased fall risk).     Baseline 28 seconds with great difficulty from 18.5 inch plinth with B UE support on mat and knees. Very unstable (04/03/2020); 18 seconds with moderate difficulty from 18.5 inch plinth with B UE support on mat. Mildly unstable. (05/07/2020); 18 seconds from same surface with similar limitations (06/26/2020); 27 seconds with moderate difficulty from 18.5 inch plinth with B UE support on mat. Mildly unstable and did not stand up fully last rep. Limited by left glute pain (10/02/2020); 14.28 seconds with B UE support from 18.5 inch plinth with B UE support on mat (10/31/2020); 13.9 seconds with B UE support from 18.5  inch plinth with B UE support on mat. Mildly unstable and did not stand up fully 1st rep. Complains of right knee pain but demonstrates improved confidence (12/18/2020); 15.78 seconds from 18.5 inch plinth with B UE support on mat. Mildly unstable and did not stand up fully 1st rep (03/04/2021);    Time 12    Period Weeks    Status Partially Met      PT LONG TERM GOAL #6   Title Pateint will improve 6 Minute Walk Test distance to equal or greater than 1000 feet with LRAD to demonstrate improved activity tolerance and endurance for community mobility and participation.    Baseline to be tested visit 2 (04/04/2019); 475 feet with SPC with SBA. (04/16/2020); 562 feet with SPC and CGA (05/07/2020); 454 feet with SPC and CGA and at least two stumbles (06/26/2020); 605 feet with SPC in left UE and one stumble (10/02/2020); 680 feet with SPC in left UE, no AFO. CGA-minA to prevent falls from several stumbles when pt caught R toe on floor. (10/31/2020); 656 feet with SPC in R UE, no AFO. CGA-minA to prevent falls from 3 stumbles when pt caught R toe on floor (12/18/2020); 691 feet with SPC in alternating UE. CGA-minA to prevent falls from 1 stumbles when pt caught R toe on floor (03/04/2021);    Time 12    Period Weeks    Status Partially Met                    Patient will benefit from skilled  therapeutic intervention in order to improve the following deficits and impairments:     Visit Diagnosis: No diagnosis found.     Problem List Patient Active Problem List   Diagnosis Date Noted   Chronic back pain 08/07/2019   Depression 08/07/2019   Near syncope 08/06/2019   Sinus bradycardia 08/06/2019   Weakness 11/03/2012    Nancy Nordmann, PT 05/28/2021, 1:07 PM  [facility] [address], [place], [address] Phone: [phone]   Fax:  [phone]  Name: KRYSTIANA FORNES MRN: [id number] Date of Birth: 1945/04/19

## 2021-05-28 NOTE — Therapy (Signed)
[facility] [address], [place], [address] Phone: [phone]   Fax:  [phone]  Physical Therapy Treatment / Progress Note / Re-Certification Dates of reporting from 03/04/2021 to 05/28/2021  Patient Details  Name: [name] MRN: [id number] Date of Birth: 09/12/1945 Referring Provider (PT): Erline Hau, [place]   Encounter Date: 05/28/2021   PT End of Session - 05/28/21 1328     Visit Number 48    Number of Visits 71    Date for PT Re-Evaluation 08/20/21    Authorization Type Medicare reporting period from 03/04/2021    Progress Note Due on Visit 24    PT Start Time 1308    PT Stop Time 1340    PT Time Calculation (min) 32 min    Equipment Utilized During Treatment Gait belt    Activity Tolerance Patient limited by fatigue;Patient limited by pain    Behavior During Therapy WFL for tasks assessed/performed             Past Medical History:  Diagnosis Date   Anxiety    Arthritis    Bilateral chronic knee pain    Chronic back pain    Depression    GERD (gastroesophageal reflux disease)    Insomnia    Memory changes    Sinus bradycardia    Urge incontinence     Past Surgical History:  Procedure Laterality Date   ABDOMINAL HYSTERECTOMY  1995   back sugery     lumbar   BIOPSY THYROID     benign goiter   BUNIONECTOMY  2013   rt foot   COLONOSCOPY     MUSCLE BIOPSY Left 10/03/2012   Procedure: LEFT QUADRICEP MUSCLE BIOPSY;  Surgeon: Odis Hollingshead, MD;  Location: [facility];  Service: General;  Laterality: Left;   NECK SURGERY  2010   cerv disc fused     There were no vitals filed for this visit.   Subjective Assessment - 05/28/21 1311     Subjective Patient arrives with SPC and R AFO donned. She states she has the same pain as usual. She has 6-7/10 pain in the right anterior knee. She has 6-7/10 burning pain in the anterolateral left knee. Patient reports she  feels that PT is helping her and that if she didn't have PT she wouldn't be mobile at all. She has been doing her HEP and broke her red band. She states she felt okay after last PT session. She continues to report feeling tired more quickly.    Pertinent History Patient is a 76 y.o. female who presents to outpatient physical therapy with a referral for medical diagnosis thoracic spine pain, lumbar spine fusion, generalized weakness. This patient's chief complaints consist of weakness and difficulty moving R leg leading to the following functional deficits: increased difficulty with daily tasks and mobility including walking, stairs, getting in and out of the car, grocery shopping, general mobility, fear of falling.. She ambulates with a single point cane and carbon fiber AFO on the R ankle.  Relevant past medical history and comorbidities include anxiety, bilateral chronic knee pain, arthritis, chronic back pain, GERD, memory changes, sinus bradycardia, urge incontinence, R foot bunionectomy (2013), former smoker.   Patient denies hx of cancer, stroke, seizures, lung problem, major cardiac events, diabetes, unexplained weight loss, changes in bowel or bladder problems, new onset stumbling or dropping things.    Limitations Lifting;Standing;Walking;House hold activities    Diagnostic  tests Chest/Lumbar CT report from 01/14/2020: " IMPRESSION:  1. No CT evidence for acute thoracic, abdominal or pelvic injury.  2. No acute fracture involving the thoracic or lumbar spine.  3. Chronic findings as detailed above."    Patient Stated Goals "to get as much out of it as I can to move better"    Currently in Pain? Yes    Pain Score 7     Pain Location Knee    Pain Orientation Right;Left;Anterior    Pain Type Chronic pain    Effect of Pain on Daily Activities difficulty with mobility, balance, walking              OBJECTIVE   SELF-REPORTED FUNCTION FOTO score: 40/100 (NOC-Neuromuscular disorder  questionnaire) ABC scale = 56.3%   FUNCTIONAL/BALANCE TESTS 6MWT: 682 feet with SPC in alternating UE. CGA-minA to prevent falls from 1 stumbles when pt caught R toe on floor.  HR 60 bpm, SpO2 98% following. Right sided circumduction gait in swing and severe genuvalgum and sinking into mild hyperextension during R stance phase.  10MWT: 0.69 m/s with SPC in left UE, CGA  for safety (some staggers, best of 2 trials, see gait form under 6MWT)  Five Time Sit to Stand (5TSTS): 14.65 seconds from 18.5 inch plinth with B UE support on mat. Mildly unstable and did not stand up fully 4th rep.    Patient used SPC in alternating UE, R AFO donned   Therapeutic exercise: to centralize symptoms and improve ROM, strength, muscular endurance, and activity tolerance required for successful completion of functional activities.  - ambulation around clinic for distance in 6 minutes with SPC in LE UE and no AFO. CGA-minA to prevent falls from several stumbles when pt caught R toe on floor. - ambulation with SPC 2x30 feet for speed - sit <> stand from 18.5 inch plinth with BUE support and SBA 1x5 for speed.  - seated cross marching on elevated plinth, 3x20 each side.  - ambulation ~ 100 feet to vehicle with CGA using SPC.   Pt required multimodal cuing for proper technique and to facilitate improved neuromuscular control, strength, range of motion, and functional ability resulting in improved performance and form. Required close supervision to minA  for safety during standing activities with occasional unsteadiness or stumble that patient was able to recover from with step strategy or by grabbing a nearby wall/object.     PT Education - 05/28/21 1704     Education Details exercise purpose/form    Person(s) Educated Patient    Methods Explanation;Demonstration;Verbal cues    Comprehension Verbalized understanding;Returned demonstration;Verbal cues required;Need further instruction              PT Short  Term Goals - 03/05/21 1002       PT SHORT TERM GOAL #1   Title Be independent with initial home exercise program for self-management of symptoms.    Baseline To be initiated at visit 2 as appropriate (04/03/2020); provided but not participating (04/24/2020); participating in ambulation and sit <> stand (05/07/2020; 06/26/2020); participating in ambulation practice at home (10/02/2020); states she has been doing a lot of walking to/from/around hospitals (03/05/2021);    Time 2    Period Weeks    Status Partially Met    Target Date 04/17/20               PT Long Term Goals - 05/28/21 1707       PT LONG TERM GOAL #1   Title  Be independent with a long-term home exercise program for self-management of symptoms.    Baseline to be updated at visit 2 as appropriate (04/03/2020); partially participating (05/07/2020; 06/26/2020; 10/02/2020); working on walking at home and climbing steps at home sit <> stands participating regularly (10/31/2020); continues to walk regularly (12/18/2020); states she has been doing a lot of walking to/from/around hospitals (03/04/2021); patinet is participating better with HEP updated last session with more seated exercises (05/28/2021);    Time 12    Period Weeks    Status Partially Met   TARGET DATE FOR ALL LONG TERM GOALS: 06/26/2020. TARGET DATE FOR UNMET GOALS UPDATED TO 09/18/2020. UPDATED TO 12/25/2020. UPDATED TO 03/12/2021. UPDATED TO 05/27/2021. UPDATED TO 08/20/2021     PT LONG TERM GOAL #2   Title Demonstrate improved FOTO score to equal or greater than 56 by visit #20 demonstrate improvement in overall condition and self-reported functional ability.    Baseline 48 (04/03/2020); 45 (05/07/2020); 45 (06/26/2020); 46 (10/02/20); 59 (10/31/2020); 45 (12/18/2020); 40 (03/04/2021); 40 (05/28/2021);    Time 12    Period Weeks    Status On-going      PT LONG TERM GOAL #3   Title Patient will improve her 10 MWT speed with her SPC to at least 1.2 m/s to promote better community ambulation  and safe crossing of streets.    Baseline to be measured visit 2 (04/03/2020); 0.54 meters/second with SPC and R AFO (04/16/2020): 0.6 meters/second with SPC (05/07/2020);  0.55 meters/second with SPC (06/26/2020); 0.49 m/sec with SPC and AFO (10/02/2020); 0.64 m/s with SPC in right UE, CGA  for safety (10/31/2020); 0.67 m/s with SPC in right UE, no AFO, CGA  for safety (no stumbles) (12/18/2020); 0.59 m/s with SPC in left UE, CGA  for safety (03/04/2021); 0.69 m/s with SPC in left UE, CGA  for safety (05/28/2021);    Time 12    Period Weeks    Status Partially Met      PT LONG TERM GOAL #4   Title Patient will improve ABC score by 13 percentage points to demonstrate improved self-reported balance.    Baseline 23.8% (04/03/2020); 32.5% (05/07/2020); 26.3% (06/26/2020); 23.8% (10/02/2020); 35% (10/31/2020); 38.1% (12/18/2020); 33.1% (03/04/2021); 56.3% (05/28/2021);    Time 12    Period Weeks    Status Achieved      PT LONG TERM GOAL #5   Title Patient will complete 5 Times Sit to Stand test from chair height without UE support in equal or less than 14 seconds to improve B LE power and strength for transfers and improved mobility, and demonstrate decreased fall risk (threshold between 12 and 15 for increased fall risk).    Baseline 28 seconds with great difficulty from 18.5 inch plinth with B UE support on mat and knees. Very unstable (04/03/2020); 18 seconds with moderate difficulty from 18.5 inch plinth with B UE support on mat. Mildly unstable. (05/07/2020); 18 seconds from same surface with similar limitations (06/26/2020); 27 seconds with moderate difficulty from 18.5 inch plinth with B UE support on mat. Mildly unstable and did not stand up fully last rep. Limited by left glute pain (10/02/2020); 14.28 seconds with B UE support from 18.5 inch plinth with B UE support on mat (10/31/2020); 13.9 seconds with B UE support from 18.5 inch plinth with B UE support on mat. Mildly unstable and did not stand up fully 1st rep.  Complains of right knee pain but demonstrates improved confidence (12/18/2020); 15.78 seconds from 18.5 inch plinth  with B UE support on mat. Mildly unstable and did not stand up fully 1st rep (03/04/2021); 14.65 seconds from 18.5 inch plinth with B UE support on mat. Mildly unstable and did not stand up fully 4th rep. (05/28/2021);    Time 12    Period Weeks    Status Partially Met      PT LONG TERM GOAL #6   Title Pateint will improve 6 Minute Walk Test distance to equal or greater than 1000 feet with LRAD to demonstrate improved activity tolerance and endurance for community mobility and participation.    Baseline to be tested visit 2 (04/04/2019); 475 feet with SPC with SBA. (04/16/2020); 562 feet with SPC and CGA (05/07/2020); 454 feet with SPC and CGA and at least two stumbles (06/26/2020); 605 feet with SPC in left UE and one stumble (10/02/2020); 680 feet with SPC in left UE, no AFO. CGA-minA to prevent falls from several stumbles when pt caught R toe on floor. (10/31/2020); 656 feet with SPC in R UE, no AFO. CGA-minA to prevent falls from 3 stumbles when pt caught R toe on floor (12/18/2020); 691 feet with SPC in alternating UE. CGA-minA to prevent falls from 1 stumbles when pt caught R toe on floor (03/04/2021); 682 feet with SPC in alternating UE. CGA-minA to prevent falls from 1 stumbles when pt caught R toe on floor.  HR 60 bpm, SpO2 98% following. Right sided circumduction gait in swing and severe genuvalgum and sinking into mild hyperextension during R stance phase (05/28/2021);    Time 12    Period Weeks    Status On-going                   Plan - 05/28/21 1329     Clinical Impression Statement Patient arrived late and treatment time was limited. Patient has attended 48 physical therapy sessions since starting this episode of care on 04/03/2020. Since her last progress note, she has improved her walking speed on 10MWT, improved her 5TSTS time, and ABC scale score (confidence in balance),  maintained the same FOTO score (self reported function) and about the same 6MWT distance. 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 Over the past year her right knee pain has worsened and she walks with hyperextension and significant genuvalgum of that knee. Pain at the right knee is likely exacerbated by abnormal mechanical forces placed on the knee due to lack of normal function of the R LE. She is considering getting knee replacement surgery to alleviate pain in the right knee but a successful outcome will be complicated by her exiting R LE deficits. Patient has difficulty with basic mobility including bed mobility, transfers, ambulation, and stairs and at an increased risk for falls. 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 She is at risk for functional decline and need for higher level of care without continued PT. PT is medically necessary to prevent/slow functional decline in her chronic condition. It is medically necessary for patient to participate in continued management of limiting condition by skilled physical therapist to address impairments and functional limitations to work towards stated goals and return to PLOF or maximal functional independence.    Personal Factors and Comorbidities Age;Comorbidity 3+;Education;Past/Current Experience;Fitness;Time since onset of injury/illness/exacerbation;Social Background    Comorbidities Relevant past medical history and comorbidities include anxiety, bilateral chronic knee pain, arthritis,  chronic back pain, GERD, memory changes, sinus bradycardia, urge incontinence, R foot bunionectomy (2013), former smoker.    Examination-Activity Limitations Bed Mobility;Lift;Squat;Locomotion Level;Stand;Caring for Others;Carry;Transfers;Dressing    Examination-Participation Restrictions Laundry;Church;Cleaning;Community  Activity;Meal Prep;Driving;Yard Work    [employer] Evolving/Moderate complexity    Rehab Potential Fair    PT Frequency 2x / week    PT Duration 12 weeks    PT Treatment/Interventions ADLs/Self Care Home Management;Aquatic Therapy;Cryotherapy;Moist Heat;Functional mobility training;Stair training;Gait training;DME Instruction;Therapeutic activities;Therapeutic exercise;Balance training;Orthotic Fit/Training;Neuromuscular re-education;Patient/family education;Manual techniques;Passive range of motion;Dry needling;Electrical Stimulation;Energy conservation;Joint Manipulations;Spinal Manipulations;Taping;Splinting    PT Next Visit Plan strengthening/balance exercises, aquatic therapy    PT Home Exercise Plan Medbridge code [id number]    Consulted and Agree with Plan of Care Patient             Patient will benefit from skilled therapeutic intervention in order to improve the following deficits and impairments:  Abnormal gait, Decreased knowledge of use of DME, Impaired sensation, Improper body mechanics, Pain, Decreased coordination, Decreased mobility, Impaired tone, Postural dysfunction, Decreased activity tolerance, Decreased endurance, Decreased range of motion, Decreased strength, Impaired perceived functional ability, Difficulty walking, Decreased balance  Visit Diagnosis: Difficulty in walking, not elsewhere classified  Muscle weakness (generalized)  Other symptoms and signs involving the musculoskeletal system  Bilateral low back pain without sciatica, unspecified chronicity  Unsteadiness on feet  Right knee pain, unspecified chronicity  Left knee pain, unspecified chronicity     Problem List Patient Active Problem List   Diagnosis Date Noted   Chronic back pain 08/07/2019   Depression 08/07/2019   Near syncope 08/06/2019   Sinus bradycardia 08/06/2019   Weakness 11/03/2012    Everlean Alstrom. Graylon Good, PT, DPT 05/28/21, 5:19 PM   [facility] [address], [place], [address] Phone: [phone]   Fax:  [phone]  Name: [name] MRN: [id number] Date of Birth: 05/04/1945

## 2021-06-03 DIAGNOSIS — H02835 Dermatochalasis of left lower eyelid: Secondary | ICD-10-CM | POA: Diagnosis not present

## 2021-06-03 DIAGNOSIS — H5711 Ocular pain, right eye: Secondary | ICD-10-CM | POA: Diagnosis not present

## 2021-06-03 DIAGNOSIS — H04201 Unspecified epiphora, right lacrimal gland: Secondary | ICD-10-CM | POA: Diagnosis not present

## 2021-06-03 DIAGNOSIS — Z48810 Encounter for surgical aftercare following surgery on the sense organs: Secondary | ICD-10-CM | POA: Diagnosis not present

## 2021-06-03 DIAGNOSIS — H02832 Dermatochalasis of right lower eyelid: Secondary | ICD-10-CM | POA: Diagnosis not present

## 2021-06-03 DIAGNOSIS — H11823 Conjunctivochalasis, bilateral: Secondary | ICD-10-CM | POA: Diagnosis not present

## 2021-06-04 ENCOUNTER — Encounter: Payer: Medicare Other | Admitting: Physical Therapy

## 2021-06-11 ENCOUNTER — Ambulatory Visit: Payer: Medicare Other | Admitting: Diagnostic Neuroimaging

## 2021-06-11 ENCOUNTER — Ambulatory Visit: Payer: Medicare Other | Admitting: Physical Therapy

## 2021-06-11 ENCOUNTER — Telehealth: Payer: Self-pay | Admitting: Physical Therapy

## 2021-06-11 DIAGNOSIS — M170 Bilateral primary osteoarthritis of knee: Secondary | ICD-10-CM | POA: Diagnosis not present

## 2021-06-11 NOTE — Telephone Encounter (Signed)
Called patient when she did not show up for her appointment today scheduled at 1pm. Left VM requesting patient call back.  ? ?Everlean Alstrom. Graylon Good, PT, DPT ?06/11/21, 4:43 PM ? ?[facility] ?[address] ?[place], [place] [address] ?P: [phone] I F: [phone] ? ?

## 2021-06-17 ENCOUNTER — Telehealth: Payer: Self-pay | Admitting: Physical Therapy

## 2021-06-17 NOTE — Telephone Encounter (Signed)
Called patient back after she left a message requesting call earlier today. Patient stated she was calling PT back from call last week on 3/22. When PT explained the call was over a no-show on 3/22, patient reminded PT that visit was supposed to be canceled due to an appointment conflict. Patient also canceled future visits, stating her husband is critically ill in the hospital. She states she will call back when things settle and she knows when she can come to PT.  ? ?Updated support staff and requested appt on 3/22 be changed from no-show to cancel if possible.  ? ?Everlean Alstrom. Graylon Good, PT, DPT ?06/17/21, 1:03 PM ? ?[facility] ?[address] ?[place], [place] [address] ?P: [phone] I F: [phone] ? ?

## 2021-06-18 ENCOUNTER — Ambulatory Visit: Payer: Medicare Other | Admitting: Physical Therapy

## 2021-07-02 DIAGNOSIS — G4700 Insomnia, unspecified: Secondary | ICD-10-CM | POA: Diagnosis not present

## 2021-07-02 DIAGNOSIS — R3981 Functional urinary incontinence: Secondary | ICD-10-CM | POA: Diagnosis not present

## 2021-07-02 DIAGNOSIS — F430 Acute stress reaction: Secondary | ICD-10-CM | POA: Diagnosis not present

## 2021-09-15 ENCOUNTER — Ambulatory Visit: Admit: 2021-09-15 | Discharge: 2021-09-15 | Payer: PRIVATE HEALTH INSURANCE | Attending: Orthopaedic Surgery

## 2021-09-15 ENCOUNTER — Ambulatory Visit: Admit: 2021-09-15 | Payer: MEDICARE

## 2021-09-15 ENCOUNTER — Encounter

## 2021-09-15 DIAGNOSIS — M25561 Pain in right knee: Secondary | ICD-10-CM

## 2021-09-15 DIAGNOSIS — M1711 Unilateral primary osteoarthritis, right knee: Secondary | ICD-10-CM | POA: Diagnosis not present

## 2021-09-15 NOTE — Progress Notes (Signed)
Name: Debra Long    DOB: [birth year]     [facility]  [facility]  [address] [name]  [place] [address]  Dept: [phone]  Dept Fax: [phone]     Chief Complaint   Patient presents with    Knee Pain        Ht 5\' 9"  (1.753 m)   Wt 230 lb (104.3 kg)   BMI 33.97 kg/m      Allergies   Allergen Reactions    Gadolinium Derivatives Rash and Swelling     Patient reported having rash on face and swelling of eyes the day after administration of MRI contrast on two occasions.    Pt tolerated IV contrast (gadolinium based) with 13 hour prednisone prep and no breakthrough reaction 08/30/14. See PACS images for details.   Patient reported having rash on face and swelling of eyes the day after administration of MRI contrast on two occasions.    Patient reported having rash on face and swelling of eyes the day after administration of MRI contrast on two occasions.            Current Outpatient Medications   Medication Sig Dispense Refill    traZODone (DESYREL) 100 MG tablet TAKE 1 TABLET BY MOUTH EVERY DAY AT BEDTIME FOR SLEEP      sertraline (ZOLOFT) 25 MG tablet TAKE 1 TABLET BY MOUTH EVERY DAY IN THE MORNING FOR ANXIETY OR STRESS OR DEPRESSION      LORazepam (ATIVAN) 1 MG tablet TAKE 1/2 TO 1 TABLET BY MOUTH TWICE DAILY FOR ANXIETY      donepezil (ARICEPT) 10 MG tablet TAKE 1 TABLET BY MOUTH EVERY DAY AT BEDTIME FOR MEMORY       No current facility-administered medications for this visit.      There is no problem list on file for this patient.     Family History   Problem Relation Age of Onset    No Known Problems Mother     No Known Problems Father       Social History     Socioeconomic History    Marital status: Unknown     Spouse name: None    Number of children: None    Years of education: None    Highest education level: None   Tobacco Use    Smoking status: Former     Types: Cigarettes     Quit date: 1999     Years since quitting:  24.4    Smokeless tobacco: Never      No past surgical history on file.   History reviewed. No pertinent past medical history.     I have reviewed and agree with PFSH and ROS and intake form in chart and the record furthermore I have reviewed prior medical record(s) regarding this patients care during this appointment.     Review of Systems:   Patient is a pleasant appearing individual, appropriately dressed, well hydrated, well nourished, who is alert, appropriately oriented for age, and in no acute distress with a normal gait and normal affect who does not appear to be in any significant pain.    Physical Exam:  Right Knee -Decrease range of motion with flexion, Knee arc of greater than 50 degrees, Some crepitation, Grossly neurovascularly intact, Good cap refill, No skin lesion, Moderate swelling, some gross instability, Some quadriceps weakness, Kellgren and Lawrence at least grade 3    Left  Knee - Full Range of Motion, No crepitation, Grossly neurovascularly intact, Good cap refill, No skin lesion, No swelling, No gross instability, No quadriceps weakness    Visit Diagnoses         Codes    Right knee pain, unspecified chronicity    -  Primary M25.561    Osteoarthritis of right knee, unspecified osteoarthritis type     M17.11    Pre-op testing     Z01.818               HPI:  The patient is here with a chief complaint of right knee pain, throbbing, burning pain, progressively getting worse. Failed conservative treatment.    X-rays are positive for severe OA of the right knee.    Assessment/Plan:  Plan will be for right total knee replacement. General medical clearance.  [facility].  No history of blood clots.  Does not take any blood thinners and no complication history and we will go from there.    As part of continued conservative pain management options the patient was advised to utilize Tylenol or OTC NSAIDS as long as it is not medically contraindicated.     Return to Office:    Follow-up and  Dispositions    Return for SCHEDULE FOR SURGERY.        Scribed by Diona Foley, LPN as dictated by [facility] A. Allena Katz, MD.  Documentation, performed by, True and Accepted Rathana Viveros A. Allena Katz, MD

## 2021-09-15 NOTE — Patient Instructions (Signed)
Knee Arthritis: Care Instructions  Overview     Knee arthritis is a breakdown of the cartilage that cushions your knee joint. When the cartilage wears down, your bones rub against each other. This causes pain and stiffness. Knee arthritis tends to get worse with time.  Treatment for knee arthritis involves reducing pain, making the leg muscles stronger, and staying at a healthy body weight. The treatment usually does not improve the health of the cartilage, but it can reduce pain and improve how well your knee works.  You can take simple measures to protect your knee joints, ease your pain, and help you stay active.  Follow-up care is a key part of your treatment and safety. Be sure to make and go to all appointments, and call your doctor if you are having problems. It's also a good idea to know your test results and keep a list of the medicines you take.  How can you care for yourself at home?  Know that knee arthritis will cause more pain on some days than on others.  Stay at a healthy weight. Lose weight if you are overweight. When you stand up, the pressure on your knees from every pound of body weight is multiplied four times. So if you lose 10 pounds, you will reduce the pressure on your knees by 40 pounds.  Talk to your doctor or physical therapist about exercises that will help ease joint pain.  Stretch to help prevent stiffness and to prevent injury before you exercise. You may enjoy gentle forms of yoga to help keep your knee joints and muscles flexible.  Walk instead of jog.  Ride a bike. This makes your thigh muscles stronger and takes pressure off your knee.  Wear well-fitting and comfortable shoes.  Exercise in chest-deep water. This can help you exercise longer with less pain.  Avoid exercises that include squatting or kneeling. They can put a lot of strain on your knees.  Talk to your doctor to make sure that the exercise you do is not making the arthritis worse.  Do not sit for long periods of time.  Try to walk once in a while to keep your knee from getting stiff.  Ask your doctor or physical therapist whether shoe inserts may reduce your arthritis pain.  If you can afford it, get new athletic shoes at least every year. This can help reduce the strain on your knees.  Use a device to help you do everyday activities.  A cane or walking stick can help you keep your balance when you walk. Hold the cane or walking stick in the hand opposite the painful knee.  If you feel like you may fall when you walk, try using crutches or a front-wheeled walker. These can prevent falls that could cause more damage to your knee.  A knee brace may help keep your knee stable and prevent pain.  You also can use other things to make life easier, such as a higher toilet seat and handrails in the bathtub or shower.  Take pain medicines exactly as directed.  Do not wait until you are in severe pain. You will get better results if you take it sooner.  If you are not taking a prescription pain medicine, take an over-the-counter medicine such as acetaminophen (Tylenol), ibuprofen (Advil, Motrin), or naproxen (Aleve). Read and follow all instructions on the label.  Do not take two or more pain medicines at the same time unless the doctor told you to. Many pain   medicines have acetaminophen, which is Tylenol. Too much acetaminophen (Tylenol) can be harmful.  Tell your doctor if you take a blood thinner, have diabetes, or have allergies to shellfish.  Ask your doctor if you might benefit from a shot of steroid medicine into your knee. This may provide pain relief for several months.  Many people take the supplements glucosamine and chondroitin for osteoarthritis. Some people feel they help, but the medical research does not show that they work. Talk to your doctor before you take these supplements.  When should you call for help?   Call your doctor now or seek immediate medical care if:    You have sudden swelling, warmth, or pain in your knee.      You have knee pain and a fever or rash.     You have such bad pain that you cannot use your knee.   Watch closely for changes in your health, and be sure to contact your doctor if you have any problems.  Where can you learn more?  Go to https://www.healthwise.net/patientEd and enter W187 to learn more about "Knee Arthritis: Care Instructions."  Current as of: May 29, 2020               Content Version: 13.5   2006-2022 [employer].   Care instructions adapted under license by [facility]. If you have questions about a medical condition or this instruction, always ask your healthcare professional. [employer] disclaims any warranty or liability for your use of this information.

## 2021-09-25 DIAGNOSIS — Z5181 Encounter for therapeutic drug level monitoring: Secondary | ICD-10-CM | POA: Diagnosis not present

## 2021-09-25 DIAGNOSIS — Z0000 Encounter for general adult medical examination without abnormal findings: Secondary | ICD-10-CM | POA: Diagnosis not present

## 2021-09-25 DIAGNOSIS — R946 Abnormal results of thyroid function studies: Secondary | ICD-10-CM | POA: Diagnosis not present

## 2021-09-25 DIAGNOSIS — Z13220 Encounter for screening for lipoid disorders: Secondary | ICD-10-CM | POA: Diagnosis not present

## 2021-09-25 DIAGNOSIS — R7309 Other abnormal glucose: Secondary | ICD-10-CM | POA: Diagnosis not present

## 2021-10-01 DIAGNOSIS — Z01818 Encounter for other preprocedural examination: Secondary | ICD-10-CM | POA: Diagnosis not present

## 2021-10-01 DIAGNOSIS — R3981 Functional urinary incontinence: Secondary | ICD-10-CM | POA: Diagnosis not present

## 2021-10-01 DIAGNOSIS — M1710 Unilateral primary osteoarthritis, unspecified knee: Secondary | ICD-10-CM | POA: Diagnosis not present

## 2021-10-01 DIAGNOSIS — Z0000 Encounter for general adult medical examination without abnormal findings: Secondary | ICD-10-CM | POA: Diagnosis not present

## 2021-10-01 DIAGNOSIS — G4700 Insomnia, unspecified: Secondary | ICD-10-CM | POA: Diagnosis not present

## 2021-10-01 DIAGNOSIS — F322 Major depressive disorder, single episode, severe without psychotic features: Secondary | ICD-10-CM | POA: Diagnosis not present

## 2021-10-01 DIAGNOSIS — R8290 Unspecified abnormal findings in urine: Secondary | ICD-10-CM | POA: Diagnosis not present

## 2021-10-21 DIAGNOSIS — Z01818 Encounter for other preprocedural examination: Secondary | ICD-10-CM | POA: Diagnosis not present

## 2021-10-21 DIAGNOSIS — G4700 Insomnia, unspecified: Secondary | ICD-10-CM | POA: Diagnosis not present

## 2021-11-18 ENCOUNTER — Encounter

## 2021-11-19 DIAGNOSIS — Z01818 Encounter for other preprocedural examination: Secondary | ICD-10-CM | POA: Diagnosis not present

## 2021-11-20 ENCOUNTER — Ambulatory Visit: Payer: Medicare Other | Admitting: Physical Therapy

## 2021-11-27 ENCOUNTER — Encounter: Admit: 2021-11-27 | Discharge: 2021-11-27 | Payer: MEDICARE | Attending: Orthopaedic Surgery

## 2021-11-27 DIAGNOSIS — M1711 Unilateral primary osteoarthritis, right knee: Secondary | ICD-10-CM

## 2021-11-27 MED ORDER — OXYCODONE-ACETAMINOPHEN 5-325 MG PO TABS
5-325 MG | ORAL_TABLET | ORAL | 0 refills | Status: AC | PRN
Start: 2021-11-27 — End: 2021-12-05

## 2021-11-27 MED ORDER — ONDANSETRON 8 MG PO TBDP
8 MG | ORAL_TABLET | Freq: Three times a day (TID) | ORAL | 0 refills | Status: AC | PRN
Start: 2021-11-27 — End: 2021-12-04

## 2021-11-27 MED ORDER — CEPHALEXIN 500 MG PO CAPS
500 MG | ORAL_CAPSULE | Freq: Three times a day (TID) | ORAL | 0 refills | Status: AC
Start: 2021-11-27 — End: 2021-11-30

## 2021-11-27 NOTE — Patient Instructions (Signed)
Knee Arthritis: Care Instructions  Overview     Knee arthritis is a breakdown of the cartilage that cushions your knee joint. When the cartilage wears down, your bones rub against each other. This causes pain and stiffness. Knee arthritis tends to get worse with time.  Treatment for knee arthritis involves reducing pain, making the leg muscles stronger, and staying at a healthy body weight. The treatment usually does not improve the health of the cartilage, but it can reduce pain and improve how well your knee works.  You can take simple measures to protect your knee joints, ease your pain, and help you stay active.  Follow-up care is a key part of your treatment and safety. Be sure to make and go to all appointments, and call your doctor if you are having problems. It's also a good idea to know your test results and keep a list of the medicines you take.  How can you care for yourself at home?  Know that knee arthritis will cause more pain on some days than on others.  Stay at a healthy weight. Lose weight if you are overweight. When you stand up, the pressure on your knees from every pound of body weight is multiplied four times. So if you lose 10 pounds, you will reduce the pressure on your knees by 40 pounds.  Talk to your doctor or physical therapist about exercises that will help ease joint pain.  Stretch to help prevent stiffness and to prevent injury before you exercise. You may enjoy gentle forms of yoga to help keep your knee joints and muscles flexible.  Walk instead of jog.  Ride a bike. This makes your thigh muscles stronger and takes pressure off your knee.  Wear well-fitting and comfortable shoes.  Exercise in chest-deep water. This can help you exercise longer with less pain.  Avoid exercises that include squatting or kneeling. They can put a lot of strain on your knees.  Talk to your doctor to make sure that the exercise you do is not making the arthritis worse.  Do not sit for long periods of time.  Try to walk once in a while to keep your knee from getting stiff.  Ask your doctor or physical therapist whether shoe inserts may reduce your arthritis pain.  If you can afford it, get new athletic shoes at least every year. This can help reduce the strain on your knees.  Use a device to help you do everyday activities.  A cane or walking stick can help you keep your balance when you walk. Hold the cane or walking stick in the hand opposite the painful knee.  If you feel like you may fall when you walk, try using crutches or a front-wheeled walker. These can prevent falls that could cause more damage to your knee.  A knee brace may help keep your knee stable and prevent pain.  You also can use other things to make life easier, such as a higher toilet seat and handrails in the bathtub or shower.  Take pain medicines exactly as directed.  Do not wait until you are in severe pain. You will get better results if you take it sooner.  If you are not taking a prescription pain medicine, take an over-the-counter medicine such as acetaminophen (Tylenol), ibuprofen (Advil, Motrin), or naproxen (Aleve). Read and follow all instructions on the label.  Do not take two or more pain medicines at the same time unless the doctor told you to. Many pain   medicines have acetaminophen, which is Tylenol. Too much acetaminophen (Tylenol) can be harmful.  Tell your doctor if you take a blood thinner, have diabetes, or have allergies to shellfish.  Ask your doctor if you might benefit from a shot of steroid medicine into your knee. This may provide pain relief for several months.  Many people take the supplements glucosamine and chondroitin for osteoarthritis. Some people feel they help, but the medical research does not show that they work. Talk to your doctor before you take these supplements.  When should you call for help?   Call your doctor now or seek immediate medical care if:    You have sudden swelling, warmth, or pain in your knee.      You have knee pain and a fever or rash.     You have such bad pain that you cannot use your knee.   Watch closely for changes in your health, and be sure to contact your doctor if you have any problems.  Where can you learn more?  Go to https://www.healthwise.net/patientEd and enter W187 to learn more about "Knee Arthritis: Care Instructions."  Current as of: May 29, 2020               Content Version: 13.5   2006-2022 [employer].   Care instructions adapted under license by [facility]. If you have questions about a medical condition or this instruction, always ask your healthcare professional. [employer] disclaims any warranty or liability for your use of this information.

## 2021-11-27 NOTE — Progress Notes (Unsigned)
Name: Debra Long    DOB: 10/16/1945     [facility] [place] [facility]  [facility]  [address], [name]  [place] [address]  Dept: [phone]  Dept Fax: [phone]     Chief Complaint   Patient presents with    Pre-op Exam    Knee Pain        Ht 5\' 9"  (1.753 m)   Wt 230 lb (104.3 kg)   BMI 33.97 kg/m      Allergies   Allergen Reactions    Gadolinium Derivatives Rash and Swelling     Patient reported having rash on face and swelling of eyes the day after administration of MRI contrast on two occasions.    Pt tolerated IV contrast (gadolinium based) with 13 hour prednisone prep and no breakthrough reaction 08/30/14. See PACS images for details.   Patient reported having rash on face and swelling of eyes the day after administration of MRI contrast on two occasions.    Patient reported having rash on face and swelling of eyes the day after administration of MRI contrast on two occasions.            Current Outpatient Medications   Medication Sig Dispense Refill    cephALEXin (KEFLEX) 500 MG capsule 1 capsule      hydrOXYzine HCl (ATARAX) 50 MG tablet 1 tablet Orally Once a day at bedtime for 7-10 days for itching for 30 day(s)      QUEtiapine (SEROQUEL) 25 MG tablet TAKE 1 TABLET BY MOUTH EVERY DAY AT BEDTIME FOR SLEEP      traZODone (DESYREL) 100 MG tablet TAKE 1 TABLET BY MOUTH EVERY DAY AT BEDTIME FOR SLEEP      sertraline (ZOLOFT) 25 MG tablet TAKE 1 TABLET BY MOUTH EVERY DAY IN THE MORNING FOR ANXIETY OR STRESS OR DEPRESSION      LORazepam (ATIVAN) 1 MG tablet TAKE 1/2 TO 1 TABLET BY MOUTH TWICE DAILY FOR ANXIETY      donepezil (ARICEPT) 10 MG tablet TAKE 1 TABLET BY MOUTH EVERY DAY AT BEDTIME FOR MEMORY       No current facility-administered medications for this visit.      There is no problem list on file for this patient.     Family History   Problem Relation Age of Onset    No Known Problems Mother     No Known Problems Father        History  reviewed. No pertinent surgical history.   History reviewed. No pertinent past medical history.     I have reviewed and agree with PFSH and ROS and intake form in chart and the record furthermore I have reviewed prior medical record(s) regarding this patients care during this appointment.     Review of Systems:   Patient is a pleasant appearing individual, appropriately dressed, well hydrated, well nourished, who is alert, appropriately oriented for age, and in no acute distress with a normal gait and normal affect who does not appear to be in any significant pain.    Physical Exam:  Right Knee -Decrease range of motion with flexion, Knee arc of greater than 50 degrees, Some crepitation, Grossly neurovascularly intact, Good cap refill, No skin lesion, Moderate swelling, some gross instability, Some quadriceps weakness, Kellgren and Lawrence at least grade 3    Left Knee - Full Range of Motion, No crepitation, Grossly neurovascularly intact, Good cap refill, No skin lesion, No  swelling, No gross instability, No quadriceps weakness     Inpatient status: The patient has admitted to severe pain in the affected knee and due to such pain they are unable to complete activities of daily living at home and/or work on a regular basis where conservative treatments have failed. After extensive discussion with the patient, they have chosen to receive a total knee replacement with the expectation of inpatient procedure. Their dependent functional status (i.e. lack of capable support and safety at home, pain management, comorbities, or difficulty ambulating with assistive walking devices) would deem them a candidate for an inpatient stay. The patient acknowledges and understand the plan.    The risks of surgery were explained to the patient which include but not limited to infection, nerve injury, artery injury, tendon injury, poor result, poor wound healing, unforeseen incidence, bleeding, infection, nerve damage, failure to  improve, worsening of symptoms, morbidity, and mortality risks were explained. All questions were answered. Patient was told of no guarantees. Patient accepts all risks and benefits. A consent for surgery will be documented and signed by the patient or a legal guardian. All questions were answered. The procedure was explained in detail.     The patient was counseled about the risks of contracting Covid-19 during their perioperative period and any recovery window from their procedure. The patient was made aware that contracting Covid-19 may worsen their prognosis for recovering from their procedure and lend to a higher morbidity and/or mortality risk. All material risks, benefits, and reasonable alternatives including postponing the procedure were discussed. The patient DOES wish to proceed with their procedure at this time.    Visit Diagnoses         Codes    Osteoarthritis of right knee, unspecified osteoarthritis type    -  Primary M17.11               Dictation #1  [id number]  [id number]  As part of continued conservative pain management options the patient was advised to utilize Tylenol or OTC NSAIDS as long as it is not medically contraindicated.     Return to Office:    Follow-up and Dispositions    Return for ALREADY SCHEDULED FOR SURGERY.        Scribed by Diona Foley, LPN as dictated by [facility] A. Allena Katz, MD.  Documentation, performed by, True and Accepted Manish A. Allena Katz, MD

## 2021-12-03 ENCOUNTER — Encounter: Admit: 2021-12-03 | Discharge: 2021-12-03 | Payer: MEDICARE | Attending: Orthopaedic Surgery

## 2021-12-03 DIAGNOSIS — M1711 Unilateral primary osteoarthritis, right knee: Secondary | ICD-10-CM | POA: Diagnosis not present

## 2021-12-03 DIAGNOSIS — E669 Obesity, unspecified: Secondary | ICD-10-CM | POA: Diagnosis not present

## 2021-12-03 DIAGNOSIS — G8918 Other acute postprocedural pain: Secondary | ICD-10-CM | POA: Diagnosis not present

## 2021-12-05 ENCOUNTER — Encounter: Payer: Self-pay | Admitting: Physical Therapy

## 2021-12-05 ENCOUNTER — Ambulatory Visit: Payer: Medicare Other | Attending: Orthopedic Surgery | Admitting: Physical Therapy

## 2021-12-05 DIAGNOSIS — M6281 Muscle weakness (generalized): Secondary | ICD-10-CM | POA: Insufficient documentation

## 2021-12-05 DIAGNOSIS — R262 Difficulty in walking, not elsewhere classified: Secondary | ICD-10-CM | POA: Insufficient documentation

## 2021-12-05 NOTE — Unmapped (Signed)
Formatting of this note is different from the original.  Images from the original note were not included.    OUTPATIENT PHYSICAL THERAPY LOWER EXTREMITY EVALUATION    Patient Name: Debra Long  MRN: [id number]  DOB:1945-05-23, 76 y.o., female  Today's Date: 12/05/2021     PT End of Session - 12/05/21 0933       Visit Number 1     Number of Visits 17     Date for PT Re-Evaluation 02/06/22     Authorization - Visit Number 1     Authorization - Number of Visits 10     PT Start Time 0915     PT Stop Time 1000     PT Time Calculation (min) 45 min     Equipment Utilized During Treatment Gait belt     Activity Tolerance Patient limited by pain;Patient limited by fatigue     Behavior During Therapy [facility] for tasks assessed/performed                Past Medical History:   Diagnosis Date    Anxiety     Arthritis     Bilateral chronic knee pain     Chronic back pain     Depression     GERD (gastroesophageal reflux disease)     Insomnia     Memory changes     Sinus bradycardia     Urge incontinence      Past Surgical History:   Procedure Laterality Date    ABDOMINAL HYSTERECTOMY  1995    back sugery      lumbar    BIOPSY THYROID      benign goiter    BUNIONECTOMY  2013    rt foot    COLONOSCOPY      MUSCLE BIOPSY Left 10/03/2012    Procedure: LEFT QUADRICEP MUSCLE BIOPSY;  Surgeon: Adolph Pollack, MD;  Location: [facility];  Service: General;  Laterality: Left;    NECK SURGERY  2010    cerv disc fused      Patient Active Problem List    Diagnosis Date Noted    Chronic back pain 08/07/2019    Depression 08/07/2019    Near syncope 08/06/2019    Sinus bradycardia 08/06/2019    Weakness 11/03/2012     PCP: Georgianne Fick MD    REFERRING PROVIDER: Lisabeth Pick MD    REFERRING DIAG: R TKA    THERAPY DIAG:   Difficulty in walking, not elsewhere classified    Rationale for Evaluation and Treatment Rehabilitation    ONSET DATE: 12/05/21    SUBJECTIVE:     SUBJECTIVE STATEMENT:  R TKA 12/03/21    PERTINENT HISTORY:  Pt  is a 77 year old female familiar with this clinic for previous episodes for RLE weakness/R foot drop. Patient with neurological deficits of RLE, particularly foot drop with unknown itiology. Pt presenting this episode with R knee TKA 12/03/21 in [place]. Ambulates into clinic with RW and R AFO donned (noted that AFO is not preventing foot drag).  Pt is currently on prescribed pain medication- trying to not take this due to side affects. Reports current/best pain level 6/10; worst 10/10.  Is wearing wrap around R knee, instructed to wear until returning next week. Wearing compression stockings bilat. Pt reports she has not seen PT yet, nor has any exercises. She has increased pain with all OOB activity, reports she is using RW for all mobility. Pt is retired, enjoyed  walking outside at her neighborhood. PLOF modI, driving, completing all community and household ADLs with increased time needed d/t RLE weakness, and with SPC. Pt's husband passed recently and she is having a hard time with this. Pt denies N/V, B&B changes, unexplained weight fluctuation, saddle paresthesia, fever, night sweats, or unrelenting night pain at this time.    Surgery: the jiffy knee medial approach in [place]    PAIN:   Are you having pain? Yes: NPRS scale: 6/10  Pain location: R knee   Pain description: throbbing, sharp   Aggravating factors: any activity up on her feet  Relieving factors: ice, pain medication     PRECAUTIONS: Knee and Fall    WEIGHT BEARING RESTRICTIONS Yes WBAT    FALLS:   Has patient fallen in last 6 months? No    LIVING ENVIRONMENT:  Lives with: lives with their son  Lives in: House/apartment  Stairs: Yes: External: 1 steps; bilateral but cannot reach both  Has following equipment at home: Walker - 2 wheeled, shower chair, bed side commode, and Grab bars    OCCUPATION: Retired    PLOF: Independent with household mobility with device and Independent with community mobility with device    PATIENT GOALS get back to walking with the  cane    OBJECTIVE:     DIAGNOSTIC FINDINGS: none to date    PATIENT SURVEYS:   FOTO 19 goal 23    COGNITION:   Overall cognitive status: Within functional limits for tasks assessed      SENSATION:  WFL    EDEMA:   Circumferential: to test next visit    MUSCLE LENGTH:  Hamstrings: Right Pain with testing Left 140 deg    POSTURE: rounded shoulders, forward head, increased thoracic kyphosis, flexed trunk , and weight shift left    PALPATION:  Knee wrapped in bandage, not palpated, per pt not to be removed until she sees surgeon next week    LOWER EXTREMITY ROM:    Active / PROM Right  eval Left  eval   Hip flexion 0/110 120   Hip extension 0/10 10/15   Hip abduction 50% limited/WNL WNL    Hip adduction     Hip internal rotation 10/20 50% limited   Hip external rotation 10/28 WNL   Knee flexion 81 123   Knee extension 34 0   Ankle dorsiflexion 10/13 17   Ankle plantarflexion     Ankle inversion     Ankle eversion      (Blank rows = not tested)    LOWER EXTREMITY MMT:    MMT Right  eval Left  eval   Hip flexion 3 4+   Hip extension 2- 2+   Hip abduction 2- 4-   Hip adduction     Hip internal rotation 2- 4-   Hip external rotation 2- 4-   Knee flexion 4- 5   Knee extension 1 5   Ankle dorsiflexion 4- (within available range 4+   Ankle plantarflexion 5 5   Ankle inversion     Ankle eversion      (Blank rows = not tested)    FUNCTIONAL TESTS:   5 times sit to stand: unable  10 meter walk test: 0.56m/s  STS unable to stand without unilateral hand push, heavy LLE push    GAIT:  Distance walked: 73m  Assistive device utilized:  walker- no wheels; R ankle AFO  Level of assistance: SBA  Comments: step to gait with decreased R stance  stance/L swing, R toe drag with AFO on ; decreased speed    TODAY'S TREATMENT:  PT reviewed the following HEP with patient with patient able to demonstrate a set of the following with min cuing for correction needed. PT educated patient on parameters of therex (how/when to inc/decrease intensity,  frequency, rep/set range, stretch hold time, and purpose of therex) with verbalized understanding.     Access Code: [email or web address]  - Supine Knee Extension Stretch on Towel Roll  - 5-8 x daily - 7 x weekly - hold  - Supine Quad Set  - 3-8 x daily - 7 x weekly - 12-20 reps - 5sec hold  - Seated Knee Flexion Slide  - 3-8 x daily - 7 x weekly - 12-20 reps - 2sec hold    PATIENT EDUCATION:   Education details: Patient was educated on diagnosis, anatomy and pathology involved, prognosis, role of PT, and was given an HEP, demonstrating exercise with proper form following verbal and tactile cues, and was given a paper hand out to continue exercise at home. Pt was educated on and agreed to plan of care.    Person educated: Patient  Education method: Explanation, Demonstration, and Handouts  Education comprehension: verbalized understanding and returned demonstration    HOME EXERCISE PROGRAM:  [id number]    ASSESSMENT:    CLINICAL IMPRESSION:  Patient is a 76 y.o. female who was seen today for physical therapy evaluation and treatment for R TKA 12/03/21 familiar with this clinic from multiple episodes of RLE strengthening with RLE neurological impairment of unknown etiology. Impairments in R foot drop, decreased R knee and bilat hip mobility, decreased RLE gross strength, L hip ext strength, decreased core strength, decreased giat speed with gait abnormalities, postural abnormalities, decreased activity tolerance, and pain. Activity limitations in ambulation (household and community), lifting, carring, squatting/stooping, basic transfers, donning and doffing shoes/socks; inhibiting full participation in community and household ADLs. Would benefit from skilled PT to address above deficits and promote optimal return to PLOF.      OBJECTIVE IMPAIRMENTS Abnormal gait, decreased activity tolerance, decreased balance, decreased cognition, decreased endurance, decreased knowledge of use of DME, decreased mobility, difficulty walking,  decreased ROM, decreased strength, decreased safety awareness, increased fascial restrictions, impaired flexibility, impaired UE functional use, improper body mechanics, postural dysfunction, prosthetic dependency , and pain.     ACTIVITY LIMITATIONS carrying, lifting, bending, sitting, standing, squatting, sleeping, stairs, transfers, bed mobility, bathing, dressing, and locomotion level    PARTICIPATION LIMITATIONS: meal prep, cleaning, laundry, driving, shopping, community activity, and yard work    PERSONAL FACTORS Age, Fitness, Past/current experiences, Sex, Time since onset of injury/illness/exacerbation, Transportation, and 3+ comorbidities: OA, neurological impairment, chronic pain, a&d, GERD  are also affecting patient's functional outcome.     REHAB POTENTIAL: Fair prior impairment limiting optimal function level    CLINICAL DECISION MAKING: Evolving/moderate complexity    EVALUATION COMPLEXITY: Moderate    GOALS:  Goals reviewed with patient? Yes    SHORT TERM GOALS: Target date: 01/02/2022   Pt will be independent with HEP in order to improve strength and balance in order to decrease fall risk and improve function at home and work.  Baseline:HEP given  Goal status: INITIAL    LONG TERM GOALS: Target date:  02/06/22    Pt will decrease worst pain as reported on NPRS by at least 3 points in order to demonstrate clinically significant reduction in pain.   Baseline: 10/10  Goal status: INITIAL    2.  Patient will increase FOTO score to 41 to demonstrate predicted increase in functional mobility to complete ADLs    Baseline: 19  Goal status: INITIAL    3.  Pt will decrease 5TSTS to at least 13 seconds in order to demonstrate age matched norms of LE strength needed for heavy household ADLs    Baseline: unable to complete  Goal status: INITIAL    4.  Pt will increase 10MWT to at least 1.0 m/s in order to demonstrate clinically significant decreased impairment and fall risk in community ambulation.     Baseline:  0.953m/s  Goal status: INITIAL    PLAN:  PT FREQUENCY: 1-2x/week    PT DURATION: 8 weeks    PLANNED INTERVENTIONS: Therapeutic exercises, Therapeutic activity, Neuromuscular re-education, Balance training, Gait training, Patient/Family education, Self Care, Joint mobilization, Joint manipulation, Stair training, DME instructions, Dry Needling, Electrical stimulation, Spinal manipulation, Spinal mobilization, Cryotherapy, Moist heat, Traction, Fluidotherapy, Manual therapy, and Re-evaluation    PLAN FOR NEXT SESSION: HEP review    Hilda Liashelsea Peterson DPT    Hilda Liashelsea Peterson, PT  12/05/2021, 11:36 AM    Electronically signed by Lawrence MarseillesPeterson, Chelsea M, PT at 12/05/2021 11:37 AM EDT

## 2021-12-05 NOTE — Therapy (Signed)
OUTPATIENT PHYSICAL THERAPY LOWER EXTREMITY EVALUATION   Patient Name: [name] MRN: [id number] DOB:[birth year], 76 y.o., female Today's Date: 12/05/2021   PT End of Session - 12/05/21 0933     Visit Number 1    Number of Visits 17    Date for PT Re-Evaluation 02/06/22    Authorization - Visit Number 1    Authorization - Number of Visits 10    PT Start Time 0915    PT Stop Time 1000    PT Time Calculation (min) 45 min    Equipment Utilized During Treatment Gait belt    Activity Tolerance Patient limited by pain;Patient limited by fatigue    Behavior During Therapy [facility] for tasks assessed/performed             Past Medical History:  Diagnosis Date   Anxiety    Arthritis    Bilateral chronic knee pain    Chronic back pain    Depression    GERD (gastroesophageal reflux disease)    Insomnia    Memory changes    Sinus bradycardia    Urge incontinence    Past Surgical History:  Procedure Laterality Date   ABDOMINAL HYSTERECTOMY  1995   back sugery     lumbar   BIOPSY THYROID     benign goiter   BUNIONECTOMY  2013   rt foot   COLONOSCOPY     MUSCLE BIOPSY Left 10/03/2012   Procedure: LEFT QUADRICEP MUSCLE BIOPSY;  Surgeon: Odis Hollingshead, MD;  Location: [place];  Service: General;  Laterality: Left;   NECK SURGERY  2010   cerv disc fused    Patient Active Problem List   Diagnosis Date Noted   Chronic back pain 08/07/2019   Depression 08/07/2019   Near syncope 08/06/2019   Sinus bradycardia 08/06/2019   Weakness 11/03/2012    PCP: Merrilee Seashore MD  REFERRING PROVIDER: Roni Bread MD  REFERRING DIAG: R TKA  THERAPY DIAG:  Difficulty in walking, not elsewhere classified  Rationale for Evaluation and Treatment Rehabilitation  ONSET DATE: 12/05/21  SUBJECTIVE:   SUBJECTIVE STATEMENT: R TKA 12/03/21  PERTINENT HISTORY: Pt is a 76 year old female familiar with this clinic for previous episodes for RLE weakness/R foot  drop. Patient with neurological deficits of RLE, particularly foot drop with unknown itiology. Pt presenting this episode with R knee TKA 12/03/21 in [place]. Ambulates into clinic with RW and R AFO donned (noted that AFO is not preventing foot drag).  Pt is currently on prescribed pain medication- trying to not take this due to side affects. Reports current/best pain level 6/10; worst 10/10.  Is wearing wrap around R knee, instructed to wear until returning next week. Wearing compression stockings bilat. Pt reports she has not seen PT yet, nor has any exercises. She has increased pain with all OOB activity, reports she is using RW for all mobility. Pt is retired, enjoyed walking outside at her neighborhood. PLOF modI, driving, completing all community and household ADLs with increased time needed d/t RLE weakness, and with SPC. Pt's husband passed recently and she is having a hard time with this. Pt denies N/V, B&B changes, unexplained weight fluctuation, saddle paresthesia, fever, night sweats, or unrelenting night pain at this time.   Surgery: the jiffy knee medial approach in [place]  PAIN:  Are you having pain? Yes: NPRS scale: 6/10 Pain location: R knee  Pain description: throbbing, sharp  Aggravating factors: any activity up on her feet  Relieving factors: ice, pain medication   PRECAUTIONS: Knee and Fall  WEIGHT BEARING RESTRICTIONS Yes WBAT  FALLS:  Has patient fallen in last 6 months? No  LIVING ENVIRONMENT: Lives with: lives with their son Lives in: House/apartment Stairs: Yes: External: 1 steps; bilateral but cannot reach both Has following equipment at home: Walker - 2 wheeled, shower chair, bed side commode, and Grab bars  OCCUPATION: Retired  PLOF: Independent with household mobility with device and Independent with community mobility with device  PATIENT GOALS get back to walking with the cane   OBJECTIVE:   DIAGNOSTIC FINDINGS: none to date  PATIENT SURVEYS:  FOTO 19 goal  43  COGNITION:  Overall cognitive status: Within functional limits for tasks assessed     SENSATION: WFL  EDEMA:  Circumferential: to test next visit  MUSCLE LENGTH: Hamstrings: Right Pain with testing Left 140 deg  POSTURE: rounded shoulders, forward head, increased thoracic kyphosis, flexed trunk , and weight shift left  PALPATION: Knee wrapped in bandage, not palpated, per pt not to be removed until she sees surgeon next week  LOWER EXTREMITY ROM:  Active / PROM Right eval Left eval  Hip flexion 0/110 120  Hip extension 0/10 10/15  Hip abduction 50% limited/WNL WNL   Hip adduction    Hip internal rotation 10/20 50% limited  Hip external rotation 10/28 WNL  Knee flexion 81 123  Knee extension 34 0  Ankle dorsiflexion 10/13 17  Ankle plantarflexion    Ankle inversion    Ankle eversion     (Blank rows = not tested)  LOWER EXTREMITY MMT:  MMT Right eval Left eval  Hip flexion 3 4+  Hip extension 2- 2+  Hip abduction 2- 4-  Hip adduction    Hip internal rotation 2- 4-  Hip external rotation 2- 4-  Knee flexion 4- 5  Knee extension 1 5  Ankle dorsiflexion 4- (within available range 4+  Ankle plantarflexion 5 5  Ankle inversion    Ankle eversion     (Blank rows = not tested)    FUNCTIONAL TESTS:  5 times sit to stand: unable 10 meter walk test: 0.65ms STS unable to stand without unilateral hand push, heavy LLE push  GAIT: Distance walked: 284mssistive device utilized:  walker- no wheels; R ankle AFO Level of assistance: SBA Comments: step to gait with decreased R stance stance/L swing, R toe drag with AFO on ; decreased speed    TODAY'S TREATMENT: PT reviewed the following HEP with patient with patient able to demonstrate a set of the following with min cuing for correction needed. PT educated patient on parameters of therex (how/when to inc/decrease intensity, frequency, rep/set range, stretch hold time, and purpose of therex) with verbalized  understanding.   Access Code: [email or web address] Supine Knee Extension Stretch on Towel Roll  - 5-8 x daily - 7 x weekly - 59m31mhold - Supine Quad Set  - 3-8 x daily - 7 x weekly - 12-20 reps - 5sec hold - Seated Knee Flexion Slide  - 3-8 x daily - 7 x weekly - 12-20 reps - 2sec hold   PATIENT EDUCATION:  Education details: Patient was educated on diagnosis, anatomy and pathology involved, prognosis, role of PT, and was given an HEP, demonstrating exercise with proper form following verbal and tactile cues, and was given a paper hand out to continue exercise at home. Pt was educated on and agreed to plan of care.  Person educated: Patient Education method: Explanation,  Demonstration, and Handouts Education comprehension: verbalized understanding and returned demonstration   HOME EXERCISE PROGRAM: [id number]  ASSESSMENT:  CLINICAL IMPRESSION: Patient is a 76 y.o. female who was seen today for physical therapy evaluation and treatment for R TKA 12/03/21 familiar with this clinic from multiple episodes of RLE strengthening with RLE neurological impairment of unknown etiology. Impairments in R foot drop, decreased R knee and bilat hip mobility, decreased RLE gross strength, L hip ext strength, decreased core strength, decreased giat speed with gait abnormalities, postural abnormalities, decreased activity tolerance, and pain. Activity limitations in ambulation (household and community), lifting, carring, squatting/stooping, basic transfers, donning and doffing shoes/socks; inhibiting full participation in community and household ADLs. Would benefit from skilled PT to address above deficits and promote optimal return to PLOF.     OBJECTIVE IMPAIRMENTS Abnormal gait, decreased activity tolerance, decreased balance, decreased cognition, decreased endurance, decreased knowledge of use of DME, decreased mobility, difficulty walking, decreased ROM, decreased strength, decreased safety awareness, increased  fascial restrictions, impaired flexibility, impaired UE functional use, improper body mechanics, postural dysfunction, prosthetic dependency , and pain.   ACTIVITY LIMITATIONS carrying, lifting, bending, sitting, standing, squatting, sleeping, stairs, transfers, bed mobility, bathing, dressing, and locomotion level  PARTICIPATION LIMITATIONS: meal prep, cleaning, laundry, driving, shopping, community activity, and yard work  PERSONAL FACTORS Age, Fitness, Past/current experiences, Sex, Time since onset of injury/illness/exacerbation, Transportation, and 3+ comorbidities: OA, neurological impairment, chronic pain, a&d, GERD  are also affecting patient's functional outcome.   REHAB POTENTIAL: Fair prior impairment limiting optimal function level  CLINICAL DECISION MAKING: Evolving/moderate complexity  EVALUATION COMPLEXITY: Moderate   GOALS: Goals reviewed with patient? Yes  SHORT TERM GOALS: Target date: 01/02/2022  Pt will be independent with HEP in order to improve strength and balance in order to decrease fall risk and improve function at home and work. Baseline:HEP given Goal status: INITIAL   LONG TERM GOALS: Target date:  02/06/22  Pt will decrease worst pain as reported on NPRS by at least 3 points in order to demonstrate clinically significant reduction in pain.  Baseline: 10/10 Goal status: INITIAL  2.  Patient will increase FOTO score to 41 to demonstrate predicted increase in functional mobility to complete ADLs  Baseline: 19 Goal status: INITIAL  3.  Pt will decrease 5TSTS to at least 13 seconds in order to demonstrate age matched norms of LE strength needed for heavy household ADLs  Baseline: unable to complete Goal status: INITIAL  4.  Pt will increase 10MWT to at least 1.0 m/s in order to demonstrate clinically significant decreased impairment and fall risk in community ambulation.   Baseline: 0.43ms Goal status: INITIAL    PLAN: PT FREQUENCY:  1-2x/week  PT DURATION: 8 weeks  PLANNED INTERVENTIONS: Therapeutic exercises, Therapeutic activity, Neuromuscular re-education, Balance training, Gait training, Patient/Family education, Self Care, Joint mobilization, Joint manipulation, Stair training, DME instructions, Dry Needling, Electrical stimulation, Spinal manipulation, Spinal mobilization, Cryotherapy, Moist heat, Traction, Fluidotherapy, Manual therapy, and Re-evaluation  PLAN FOR NEXT SESSION: HEP review  CDurwin RegesDPT  CDurwin Reges PT 12/05/2021, 11:36 AM

## 2021-12-09 ENCOUNTER — Encounter: Admit: 2021-12-09 | Discharge: 2021-12-09 | Payer: MEDICARE

## 2021-12-09 DIAGNOSIS — Z96651 Presence of right artificial knee joint: Secondary | ICD-10-CM

## 2021-12-09 NOTE — Progress Notes (Signed)
Name: Debra Long    DOB: May 18, 1945        11/27/2021     1:16 PM 09/15/2021     3:38 PM   Ambulatory Bariatric Summary   Weight - Scale 230 230   Height 5\' 9"  (1.753 m) 5\' 9"  (1.753 m)   BMI 34 kg/m2 34 kg/m2   Weight - Scale 230 lb (104.3 kg) 230 lb (104.3 kg)   BMI (Calculated) 34 34       There is no height or weight on file to calculate BMI.    Service Dept: [facility]  [address], [place] [address]  Dept: [phone]  Dept Fax: [phone]     Patient's Pharmacies:    [facility] [phone], [place] - [place] [phone] Wanda Plump [phone]  [place]  [place] [address]  Phone: [phone] Fax: [phone]       No chief complaint on file.      HPI:  Patient presents for postop care following right TKA. Surgery was on 12/03/2021.  Ambulating good with a walker.   Pain is controlled with current analgesics.  Medication(s) being used: narcotic analgesics including oxycodone/acetaminophen (Percocet, Tylox).     There were no vitals taken for this visit.   Allergies   Allergen Reactions    Gadolinium Derivatives Rash and Swelling     Patient reported having rash on face and swelling of eyes the day after administration of MRI contrast on two occasions.    Pt tolerated IV contrast (gadolinium based) with 13 hour prednisone prep and no breakthrough reaction 08/30/14. See PACS images for details.   Patient reported having rash on face and swelling of eyes the day after administration of MRI contrast on two occasions.    Patient reported having rash on face and swelling of eyes the day after administration of MRI contrast on two occasions.          Current Outpatient Medications   Medication Sig Dispense Refill    cephALEXin (KEFLEX) 500 MG capsule 1 capsule      hydrOXYzine HCl (ATARAX) 50 MG tablet 1 tablet Orally Once a day at bedtime for 7-10 days for itching for 30  day(s)      QUEtiapine (SEROQUEL) 25 MG tablet TAKE 1 TABLET BY MOUTH EVERY DAY AT BEDTIME FOR SLEEP      traZODone (DESYREL) 100 MG tablet TAKE 1 TABLET BY MOUTH EVERY DAY AT BEDTIME FOR SLEEP      sertraline (ZOLOFT) 25 MG tablet TAKE 1 TABLET BY MOUTH EVERY DAY IN THE MORNING FOR ANXIETY OR STRESS OR DEPRESSION      LORazepam (ATIVAN) 1 MG tablet TAKE 1/2 TO 1 TABLET BY MOUTH TWICE DAILY FOR ANXIETY      donepezil (ARICEPT) 10 MG tablet TAKE 1 TABLET BY MOUTH EVERY DAY AT BEDTIME FOR MEMORY       No current facility-administered medications for this visit.      There is no problem list on file for this patient.     Family History   Problem Relation Age of Onset    No Known Problems Mother     No Known Problems Father       Social History     Socioeconomic History    Marital status: Unknown   Tobacco Use    Smoking status: Former  Types: Cigarettes     Quit date: 1999     Years since quitting: 24.7    Smokeless tobacco: Never      No past surgical history on file.   No past medical history on file.     I have reviewed and agree with PFSH and ROS and intake form in chart and the record furthermore I have reviewed prior medical record(s) regarding this patients care during this appointment.     Review of Systems:  Patient is a pleasant appearing individual, appropriately dressed, well hydrated, well nourished, who is alert, appropriately oriented for age, and in no acute distress with a normal gait and normal affect who does not appear to be in any significant pain.     Physical Exam:  Left knee - Neurovascularly intact with good cap refill, full range of motion and full strength, no swelling, no erythema, no instability.     Right knee - Decrease range of motion with flexion, Some crepitation, Grossly neurovascularly intact, Good cap refill, No skin lesion, Moderate swelling, No gross instability, Some quadriceps weakness       Plan:  1. Continue PT.  2. Wound care/showering discussed.  3. Continue DVT  prophylaxis as directed.  4. Pt is to increase activities as tolerated.  Follow up in 2-3 weeks for the right knee to ensure patient is progressing off of the walker and as needed or if symptoms worsen.    Encounter Diagnosis   Name Primary?    Status post total right knee replacement Yes        As part of continued conservative pain management options the patient was advised to utilize Tylenol or OTC NSAIDS as long as it is not medically contraindicated.     Return to Office:      Scribed by Sharia Reeve, APRN - CNP as dictated by Travante Knee A. Kelley Knoth, APRN, CNP.  Documentation, performed by, True and Accepted Cleora Karnik A. Shyrl Obi, APRN, CNP

## 2021-12-09 NOTE — Telephone Encounter (Signed)
error 

## 2021-12-09 NOTE — Patient Instructions (Signed)
Post Operative Total Knee Replacement Instructions    PLEASE REMOVE YOUR LONG WHITE BANDAGE & STOCKING PRIOR TO CONNECTING TO YOUR APPOINTMENT       During your recovery from a total knee replacement, you will be participating in an OUTPATIENT physical therapy program. Your goal is to progress from a walker to a cane to nothing at all while walking, if possible, over the next 2 weeks.     You can now shower and get your incision wet, pat it dry afterwards. No further dressing changes will be required as long as there is no drainage.  You may take a bath 3 weeks post surgery as long as there is no drainage from your incision.    You will see a clear surgical mesh tape over the incision. That mesh tape should come off in the next 2 weeks if it does not you may rub Vaseline over it to help break up the glue and remove it with ease.    You may drive if you are not using any assistive devices to walk and are not using any narcotic pain medication.     You may discontinue your aspirin (if that is your primary blood thinner prescribed by Dr. Patel ) when you are at least 2 weeks out from surgery and are no longer using a cane or walker.  If you are still using assistive devices, please DO NOT stop the aspirin until you are completely off them.  If you are on other blood thinners prescribed by another doctor please continue that until you are instructed to discontinue them.    You and your physical therapist will determine when to stop your physical therapy program.    Narcotic pain medication can cause constipation.  You may take over the counter stool softeners such as Docusate Sodium or Miralax 1-2 times per day to assist with the constipation.  Ensure you are taking in plenty of fluids and fiber as well.    If you require a refill on a narcotic pain medication, please let Dr. Patel or his Nurse Practitioner know at your appointment today or AT LEAST 48 hours prior to needing it. No refills will be provided after hours  or during weekends.    If you experience any significant calf pain, swelling, or shortness of breath, please call our office immediately or go to the nearest emergency room.    If you notice any significant increase in swelling of the knee or leg, redness around the incision site, drainage, pus, or any oozing fluid from the incision please notify our office immediately.      If you develop a fever greater than 101.5 or have any significant trauma or falls, please call and notify our office.  A low-grade temperature below 101 can be normal for the first few weeks.     Bruising & pain around your thigh, leg, & foot are normal for up to 6-8 weeks.     You may experience a clicking or clunking noise in your knee, this is normal you now have an artificial implant in place that can cause these noises.     Moving forward if you have any type of DENTAL WORK/CLEANINGS or any invasive procedures that involve a risk of bleeding we recommend that you pre medicate one hour prior to these procedures with antibiotics. Please let the performing provider know that you have a total knee implant in place so they may pre medicate you accordingly. Please contact our office   at least 72 hours prior to needing these medications if they do not.     At any time if you feel like you have stopped progressing in therapy or your knee feels worse, please call our office for an appointment to be seen.

## 2021-12-11 ENCOUNTER — Ambulatory Visit: Payer: Medicare Other | Admitting: Physical Therapy

## 2021-12-17 ENCOUNTER — Ambulatory Visit: Payer: Medicare Other

## 2021-12-17 DIAGNOSIS — M6281 Muscle weakness (generalized): Secondary | ICD-10-CM

## 2021-12-17 DIAGNOSIS — R262 Difficulty in walking, not elsewhere classified: Secondary | ICD-10-CM

## 2021-12-17 NOTE — Therapy (Signed)
OUTPATIENT PHYSICAL THERAPY TREATMENT    Patient Name: [name] MRN: [id number] DOB:1946-02-04, 76 y.o., female Today's Date: 12/17/2021   PT End of Session - 12/17/21 0921     Visit Number 2    Number of Visits 17    Date for PT Re-Evaluation 02/06/22    Authorization Type Medicare    Authorization Time Period 12/05/21-02/06/22    Progress Note Due on Visit 10    PT Start Time 0915    PT Stop Time 0955    PT Time Calculation (min) 40 min    Equipment Utilized During Treatment Gait belt    Activity Tolerance Patient limited by pain;Patient tolerated treatment well    Behavior During Therapy [facility] for tasks assessed/performed             Past Medical History:  Diagnosis Date   Anxiety    Arthritis    Bilateral chronic knee pain    Chronic back pain    Depression    GERD (gastroesophageal reflux disease)    Insomnia    Memory changes    Sinus bradycardia    Urge incontinence    Past Surgical History:  Procedure Laterality Date   ABDOMINAL HYSTERECTOMY  1995   back sugery     lumbar   BIOPSY THYROID     benign goiter   BUNIONECTOMY  2013   rt foot   COLONOSCOPY     MUSCLE BIOPSY Left 10/03/2012   Procedure: LEFT QUADRICEP MUSCLE BIOPSY;  Surgeon: Odis Hollingshead, MD;  Location: [facility];  Service: General;  Laterality: Left;   NECK SURGERY  2010   cerv disc fused    Patient Active Problem List   Diagnosis Date Noted   Chronic back pain 08/07/2019   Depression 08/07/2019   Near syncope 08/06/2019   Sinus bradycardia 08/06/2019   Weakness 11/03/2012    PCP: Merrilee Seashore MD  REFERRING PROVIDER: Roni Bread MD  REFERRING DIAG: Rt TKA Medial approach  THERAPY DIAG:  Difficulty in walking, not elsewhere classified  Muscle weakness (generalized)  Rationale for Evaluation and Treatment Rehabilitation  ONSET DATE: 12/05/21  SUBJECTIVE:   SUBJECTIVE STATEMENT: Pt has been working on handout from hospital which  apparently looks very different from exercises performed here today. Not taking pain meds much, but sounds like she has been managing pain with avoiding painful ranges.   PERTINENT HISTORY: Pt is a 76 year old female familiar with this clinic for previous episodes for RLE weakness/R foot drop. Patient with neurological deficits of RLE, particularly foot drop with unknown itiology. Pt presenting this episode with R knee TKA 12/03/21 in [place]. Ambulates into clinic with RW and R AFO donned (noted that AFO is not preventing foot drag).  Pt is currently on prescribed pain medication- trying to not take this due to side affects. Reports current/best pain level 6/10; worst 10/10.  Is wearing wrap around R knee, instructed to wear until returning next week. Wearing compression stockings bilat. Pt reports she has not seen PT yet, nor has any exercises. She has increased pain with all OOB activity, reports she is using RW for all mobility. Pt is retired, enjoyed walking outside at her neighborhood. PLOF modI, driving, completing all community and household ADLs with increased time needed d/t RLE weakness, and with SPC. Pt's husband passed recently and she is having a hard time with this. Pt denies N/V, B&B changes, unexplained weight fluctuation, saddle paresthesia, fever, night sweats, or unrelenting night  pain at this time.   Surgery: the jiffy knee medial approach in [place]  PAIN:  Are you having pain? Yes: NPRS scale: 6/10 Pain location: R knee  Pain description: throbbing, sharp  Aggravating factors: any activity up on her feet Relieving factors: ice, pain medication   PRECAUTIONS: Knee and Fall  WEIGHT BEARING RESTRICTIONS Yes WBAT     TODAY'S TREATMENT: -seated heel slides on slider x3 minutes, active assist for flexion stretch 2/2 foot drop -seated flexion stretch 3x30sec  -hooklying knee extension stretch (has 14 degrees extension today) 3x30sec (poor tolerance due to lack of analgesia, using ice to  help) -SAQ 3x15 AA/ROM -hooklying bridge 2x15 -heel slides 2x15 AA/ROM  *resting between sets with knee in tolerable extension stretch   PATIENT EDUCATION:  Education details: Patient was educated on diagnosis, anatomy and pathology involved, prognosis, role of PT, and was given an HEP, demonstrating exercise with proper form following verbal and tactile cues, and was given a paper hand out to continue exercise at home. Pt was educated on and agreed to plan of care.  Person educated: Patient Education method: Explanation, Demonstration, and Handouts Education comprehension: verbalized understanding and returned demonstration   HOME EXERCISE PROGRAM: [id number]  ASSESSMENT:  CLINICAL IMPRESSION: Moderate ROM work and rt leg activation work. Pt did not know to premedicate for pain, hence session is also largely attentive to pain control. Extensive education on limb positioning at night, avoiding flexion. Pt gets relief here with ice, but reports her ice pack at home is not as good. Pt reports her oxycodone makes her sleepy. Author encouraged her to call provider and ask about OTC alternatives for lower pain levels. Author asks pt to bring in handout from hospital on next session. Will benefit from skilled PT to address above deficits and promote optimal return to PLOF.     OBJECTIVE IMPAIRMENTS Abnormal gait, decreased activity tolerance, decreased balance, decreased cognition, decreased endurance, decreased knowledge of use of DME, decreased mobility, difficulty walking, decreased ROM, decreased strength, decreased safety awareness, increased fascial restrictions, impaired flexibility, impaired UE functional use, improper body mechanics, postural dysfunction, prosthetic dependency , and pain.   ACTIVITY LIMITATIONS carrying, lifting, bending, sitting, standing, squatting, sleeping, stairs, transfers, bed mobility, bathing, dressing, and locomotion level  PARTICIPATION LIMITATIONS: meal  prep, cleaning, laundry, driving, shopping, community activity, and yard work  PERSONAL FACTORS Age, Fitness, Past/current experiences, Sex, Time since onset of injury/illness/exacerbation, Transportation, and 3+ comorbidities: OA, neurological impairment, chronic pain, a&d, GERD  are also affecting patient's functional outcome.   REHAB POTENTIAL: Fair prior impairment limiting optimal function level  CLINICAL DECISION MAKING: Evolving/moderate complexity  EVALUATION COMPLEXITY: Moderate   GOALS: Goals reviewed with patient? Yes  SHORT TERM GOALS: Target date: 01/14/2022  Pt will be independent with HEP in order to improve strength and balance in order to decrease fall risk and improve function at home and work. Baseline:HEP given Goal status: INITIAL   LONG TERM GOALS: Target date:  02/06/22  Pt will decrease worst pain as reported on NPRS by at least 3 points in order to demonstrate clinically significant reduction in pain.  Baseline: 10/10 Goal status: INITIAL  2.  Patient will increase FOTO score to 41 to demonstrate predicted increase in functional mobility to complete ADLs  Baseline: 19 Goal status: INITIAL  3.  Pt will decrease 5TSTS to at least 13 seconds in order to demonstrate age matched norms of LE strength needed for heavy household ADLs  Baseline: unable to complete Goal status: INITIAL  4.  Pt will increase 10MWT to at least 1.0 m/s in order to demonstrate clinically significant decreased impairment and fall risk in community ambulation.   Baseline: 0.37ms Goal status: INITIAL    PLAN: PT FREQUENCY: 1-2x/week  PT DURATION: 8 weeks  PLANNED INTERVENTIONS: Therapeutic exercises, Therapeutic activity, Neuromuscular re-education, Balance training, Gait training, Patient/Family education, Self Care, Joint mobilization, Joint manipulation, Stair training, DME instructions, Dry Needling, Electrical stimulation, Spinal manipulation, Spinal mobilization,  Cryotherapy, Moist heat, Traction, Fluidotherapy, Manual therapy, and Re-evaluation  PLAN FOR NEXT SESSION: HEP review  9:34 AM, 12/17/21 AEtta Grandchild PT, DPT Physical Therapist - [phone](Office)    Kweli Grassel C, PT 12/17/2021, 9:33 AM

## 2021-12-18 ENCOUNTER — Telehealth

## 2021-12-18 MED ORDER — OXYCODONE-ACETAMINOPHEN 5-325 MG PO TABS
5-325 MG | ORAL_TABLET | ORAL | 0 refills | Status: AC | PRN
Start: 2021-12-18 — End: 2021-12-26

## 2021-12-18 NOTE — Telephone Encounter (Signed)
Patient is requesting refill on pain medication.     Verified pharmacy in chart.     RT TKR 12/03/2021

## 2021-12-23 ENCOUNTER — Telehealth

## 2021-12-23 MED ORDER — OXYCODONE-ACETAMINOPHEN 5-325 MG PO TABS
5-325 MG | ORAL_TABLET | ORAL | 0 refills | Status: DC | PRN
Start: 2021-12-23 — End: 2021-12-30

## 2021-12-23 NOTE — Telephone Encounter (Signed)
RT TKR 12/03/21    Patient had Percocet sent in on 12/18/21, but she's stating her pharmacy doesn't have it. Please cancel and resend to:    [facility] [place], [place]

## 2021-12-24 ENCOUNTER — Ambulatory Visit: Payer: Medicare Other | Admitting: Physical Therapy

## 2021-12-25 ENCOUNTER — Ambulatory Visit: Payer: Medicare Other | Attending: Orthopedic Surgery

## 2021-12-25 DIAGNOSIS — M545 Low back pain, unspecified: Secondary | ICD-10-CM | POA: Diagnosis not present

## 2021-12-25 DIAGNOSIS — M6281 Muscle weakness (generalized): Secondary | ICD-10-CM | POA: Diagnosis not present

## 2021-12-25 DIAGNOSIS — R262 Difficulty in walking, not elsewhere classified: Secondary | ICD-10-CM | POA: Diagnosis not present

## 2021-12-25 DIAGNOSIS — R29898 Other symptoms and signs involving the musculoskeletal system: Secondary | ICD-10-CM | POA: Diagnosis not present

## 2021-12-25 DIAGNOSIS — R2681 Unsteadiness on feet: Secondary | ICD-10-CM

## 2021-12-25 NOTE — Therapy (Signed)
OUTPATIENT PHYSICAL THERAPY TREATMENT    Patient Name: [name] MRN: [id number] DOB:[birth year], 76 y.o., female Today's Date: 12/25/2021   PT End of Session - 12/25/21 1406     Visit Number 3    Number of Visits 17    Date for PT Re-Evaluation 02/06/22    Authorization Type Medicare    Authorization Time Period 12/05/21-02/06/22    Progress Note Due on Visit 10    PT Start Time 1400    PT Stop Time 1425    PT Time Calculation (min) 25 min    Activity Tolerance Patient limited by pain;Patient tolerated treatment well    Behavior During Therapy [facility] for tasks assessed/performed             Past Medical History:  Diagnosis Date   Anxiety    Arthritis    Bilateral chronic knee pain    Chronic back pain    Depression    GERD (gastroesophageal reflux disease)    Insomnia    Memory changes    Sinus bradycardia    Urge incontinence    Past Surgical History:  Procedure Laterality Date   ABDOMINAL HYSTERECTOMY  1995   back sugery     lumbar   BIOPSY THYROID     benign goiter   BUNIONECTOMY  2013   rt foot   COLONOSCOPY     MUSCLE BIOPSY Left 10/03/2012   Procedure: LEFT QUADRICEP MUSCLE BIOPSY;  Surgeon: Odis Hollingshead, MD;  Location: [place];  Service: General;  Laterality: Left;   NECK SURGERY  2010   cerv disc fused    Patient Active Problem List   Diagnosis Date Noted   Chronic back pain 08/07/2019   Depression 08/07/2019   Near syncope 08/06/2019   Sinus bradycardia 08/06/2019   Weakness 11/03/2012    PCP: Merrilee Seashore MD  REFERRING PROVIDER: Roni Bread MD  REFERRING DIAG: Rt TKA Medial approach  THERAPY DIAG:  Difficulty in walking, not elsewhere classified  Muscle weakness (generalized)  Other symptoms and signs involving the musculoskeletal system  Bilateral low back pain without sciatica, unspecified chronicity  Unsteadiness on feet  Rationale for Evaluation and Treatment Rehabilitation  ONSET DATE:  12/05/21  SUBJECTIVE:   SUBJECTIVE STATEMENT: Pt arrives for PT direct from [place] where she has been with Son. She has been staying there since her husband passed away 3 weeks ago. Pt has a hard time reporting exercises performed at home, describes heel slides then perseverates back to her persistent and curious edema a topic that she bring sup >15x in-session. Pt says pain has not been too bad, but her stiffness has been concerning. Pt arrives with [facility], son providing MinG assist. PT reports she was tripping over RW (I suspect kicking it due to excessive toeing out).    PERTINENT HISTORY: Pt is a 76 year old female familiar with this clinic for previous episodes for RLE weakness/R foot drop. Patient with neurological deficits of RLE, particularly foot drop with unknown itiology. Pt presenting this episode with R knee TKA 12/03/21 in [place]. Ambulates into clinic with RW and R AFO donned (noted that AFO is not preventing foot drag).  Pt is currently on prescribed pain medication- trying to not take this due to side affects. Reports current/best pain level 6/10; worst 10/10.  Is wearing wrap around R knee, instructed to wear until returning next week. Wearing compression stockings bilat. Pt reports she has not seen PT yet, nor has any exercises.  She has increased pain with all OOB activity, reports she is using RW for all mobility. Pt is retired, enjoyed walking outside at her neighborhood. PLOF modI, driving, completing all community and household ADLs with increased time needed d/t RLE weakness, and with SPC. Pt's husband passed recently and she is having a hard time with this. Pt denies N/V, B&B changes, unexplained weight fluctuation, saddle paresthesia, fever, night sweats, or unrelenting night pain at this time.   Surgery: the jiffy knee medial approach in [place]  PAIN:  Are you having pain? Yes: NPRS scale: 2/10 Pain location: R knee  Pain description: throbbing, sharp  Aggravating factors: any  activity up on her feet Relieving factors: ice, pain medication   PRECAUTIONS: Knee and Fall  WEIGHT BEARING RESTRICTIONS Yes WBAT     TODAY'S TREATMENT: -seated heel slides on slider x3 minutes (concerningly poor hamstrings activation today using UE to assist leg), no frank antalgia limitations in ranging so much as motor weakness.  -standing BLE high knee flexion 2x10 bilat, RLE globally weak, very limited vertical excursion -STS from chair+airex 2x10 c hand pushoff from chair arm rests, no LOB, RW placed in front for as/needed stabilization -36f AMB c SPC, inside, outside, manages door corners and narrow spaces outside *asked son to bring her AFO next visit to assess fit and/or recommend alternate device while persistent edema limits use of premorbid device.    PATIENT EDUCATION:  Education details: Patient was educated on diagnosis, anatomy and pathology involved, prognosis, role of PT, and was given an HEP, demonstrating exercise with proper form following verbal and tactile cues, and was given a paper hand out to continue exercise at home. Pt was educated on and agreed to plan of care.  Person educated: Patient Education method: Explanation, Demonstration, and Handouts Education comprehension: verbalized understanding and returned demonstration   HOME EXERCISE PROGRAM: [id number] ASSESSMENT:  CLINICAL IMPRESSION: Pt back for PT session[place] Pt seems more disorganized in thought process today, difficulty with providing interval history. Pt very perseverative on talking about swelling almost entire session, somewhat concerning for ST memory awareness. RLE appears generally weaker than prior session, pt sturggling to achieve hip flexion in marching, struggles with hamstrings function seated, doe snot appear related to pain. AFO still being used which is of concern. Pt no longer using RW which is also oc safety concern. Will continue to benefit  from PT going forward.    OBJECTIVE IMPAIRMENTS Abnormal gait, decreased activity tolerance, decreased balance, decreased cognition, decreased endurance, decreased knowledge of use of DME, decreased mobility, difficulty walking, decreased ROM, decreased strength, decreased safety awareness, increased fascial restrictions, impaired flexibility, impaired UE functional use, improper body mechanics, postural dysfunction, prosthetic dependency , and pain.   ACTIVITY LIMITATIONS carrying, lifting, bending, sitting, standing, squatting, sleeping, stairs, transfers, bed mobility, bathing, dressing, and locomotion level  PARTICIPATION LIMITATIONS: meal prep, cleaning, laundry, driving, shopping, community activity, and yard work  PERSONAL FACTORS Age, Fitness, Past/current experiences, Sex, Time since onset of injury/illness/exacerbation, Transportation, and 3+ comorbidities: OA, neurological impairment, chronic pain, a&d, GERD  are also affecting patient's functional outcome.   REHAB POTENTIAL: Fair prior impairment limiting optimal function level  CLINICAL DECISION MAKING: Evolving/moderate complexity  EVALUATION COMPLEXITY: Moderate   GOALS: Goals reviewed with patient? Yes  SHORT TERM GOALS: Target date: 01/22/2022  Pt will be independent with HEP in order to improve strength and balance in order to decrease fall risk and improve function at home and  work. Baseline:HEP given Goal status: INITIAL   LONG TERM GOALS: Target date:  02/06/22  Pt will decrease worst pain as reported on NPRS by at least 3 points in order to demonstrate clinically significant reduction in pain.  Baseline: 10/10 Goal status: INITIAL  2.  Patient will increase FOTO score to 41 to demonstrate predicted increase in functional mobility to complete ADLs  Baseline: 19 Goal status: INITIAL  3.  Pt will decrease 5TSTS to at least 13 seconds in order to demonstrate age matched norms of LE strength needed for heavy  household ADLs  Baseline: unable to complete Goal status: INITIAL  4.  Pt will increase 10MWT to at least 1.0 m/s in order to demonstrate clinically significant decreased impairment and fall risk in community ambulation.   Baseline: 0.12ms Goal status: INITIAL    PLAN: PT FREQUENCY: 1-2x/week  PT DURATION: 8 weeks  PLANNED INTERVENTIONS: Therapeutic exercises, Therapeutic activity, Neuromuscular re-education, Balance training, Gait training, Patient/Family education, Self Care, Joint mobilization, Joint manipulation, Stair training, DME instructions, Dry Needling, Electrical stimulation, Spinal manipulation, Spinal mobilization, Cryotherapy, Moist heat, Traction, Fluidotherapy, Manual therapy, and Re-evaluation  PLAN FOR NEXT SESSION: HEP re-issue, pt has not brought in an HEP from hospital yet, also need to reassess AFO fitting, pt not wearing due to post surgical edema.   2:15 PM, 12/25/21 AEtta Grandchild PT, DPT Physical Therapist - [phone](Office)    BMaricaoC, PT 12/25/2021, 2:15 PM

## 2021-12-26 ENCOUNTER — Ambulatory Visit: Payer: Medicare Other | Admitting: Physical Therapy

## 2021-12-26 ENCOUNTER — Telehealth: Payer: MEDICARE

## 2021-12-26 NOTE — Progress Notes (Unsigned)
Name: Debra Long    DOB: 1946-02-11        11/27/2021     1:16 PM 09/15/2021     3:38 PM   Ambulatory Bariatric Summary   Weight - Scale 230 230   Height 5\' 9"  (1.753 m) 5\' 9"  (1.753 m)   BMI 34 kg/m2 34 kg/m2   Weight - Scale 230 lb (104.3 kg) 230 lb (104.3 kg)   BMI (Calculated) 34 34       There is no height or weight on file to calculate BMI.    Service Dept: [facility]  [address], [place] [address]  Dept: [phone]  Dept Fax: [phone]     Patient's Pharmacies:    [facility] [address], [place] [phone] Wanda Plump [id number]  [place]  [place] [address]  Phone: [phone] Fax: [phone]       No chief complaint on file.      HPI:  Patient presents for postop care following right TKA. Surgery was on 12/03/2021.  Ambulating good without ***. Pain is a ***/10. Pain is controlled with current analgesics.  Medication(s) being used: narcotic analgesics including oxycodone/acetaminophen (Percocet, Tylox).     There were no vitals taken for this visit.   Allergies   Allergen Reactions    Gadolinium Derivatives Rash and Swelling     Patient reported having rash on face and swelling of eyes the day after administration of MRI contrast on two occasions.    Pt tolerated IV contrast (gadolinium based) with 13 hour prednisone prep and no breakthrough reaction 08/30/14. See PACS images for details.   Patient reported having rash on face and swelling of eyes the day after administration of MRI contrast on two occasions.    Patient reported having rash on face and swelling of eyes the day after administration of MRI contrast on two occasions.          Current Outpatient Medications   Medication Sig Dispense Refill    oxyCODONE-acetaminophen (PERCOCET) 5-325 MG per tablet Take 1 tablet by mouth every 4-6 hours as needed for Pain for up to 8 days. Max Daily Amount: 6  tablets 30 tablet 0    cephALEXin (KEFLEX) 500 MG capsule 1 capsule      hydrOXYzine HCl (ATARAX) 50 MG tablet 1 tablet Orally Once a day at bedtime for 7-10 days for itching for 30 day(s)      QUEtiapine (SEROQUEL) 25 MG tablet TAKE 1 TABLET BY MOUTH EVERY DAY AT BEDTIME FOR SLEEP      traZODone (DESYREL) 100 MG tablet TAKE 1 TABLET BY MOUTH EVERY DAY AT BEDTIME FOR SLEEP      sertraline (ZOLOFT) 25 MG tablet TAKE 1 TABLET BY MOUTH EVERY DAY IN THE MORNING FOR ANXIETY OR STRESS OR DEPRESSION      LORazepam (ATIVAN) 1 MG tablet TAKE 1/2 TO 1 TABLET BY MOUTH TWICE DAILY FOR ANXIETY      donepezil (ARICEPT) 10 MG tablet TAKE 1 TABLET BY MOUTH EVERY DAY AT BEDTIME FOR MEMORY       No current facility-administered medications for this visit.      There is no problem list on file for this patient.     Family History   Problem Relation Age of Onset    No Known Problems Mother     No Known Problems  Father       Social History     Socioeconomic History    Marital status: Unknown   Tobacco Use    Smoking status: Former     Types: Cigarettes     Quit date: 1999     Years since quitting: 24.7    Smokeless tobacco: Never      No past surgical history on file.   No past medical history on file.     I have reviewed and agree with PFSH and ROS and intake form in chart and the record furthermore I have reviewed prior medical record(s) regarding this patients care during this appointment.     Review of Systems:  Patient is a pleasant appearing individual, appropriately dressed, well hydrated, well nourished, who is alert, appropriately oriented for age, and in no acute distress with a normal gait and normal affect who does not appear to be in any significant pain.     Physical Exam:  Left knee - Neurovascularly intact with good cap refill, full range of motion and full strength,  no swelling, no erythema, no instability.     Right knee - Decrease range of motion with flexion, Some crepitation, Grossly neurovascularly intact, Good  cap refill, No skin lesion, Moderate swelling, No gross instability, Some quadriceps weakness         Plan:  1. Continue PT.  2. Wound care/showering discussed.  3. Continue DVT prophylaxis as directed.  4. Pt is to increase activities as tolerated.  Follow up in *** weeks for the *** knee and as needed or if symptoms worsen.    No diagnosis found.     As part of continued conservative pain management options the patient was advised to utilize Tylenol or OTC NSAIDS as long as it is not medically contraindicated.     Return to Office:      Scribed by Sharia Reeve, APRN - CNP as dictated by Xavier Munger A. Raad Clayson, APRN, CNP.  Documentation, performed by, True and Accepted Antron Seth A. Paxon Propes, APRN, CNP

## 2021-12-29 ENCOUNTER — Ambulatory Visit: Payer: Medicare Other | Admitting: Physical Therapy

## 2021-12-29 NOTE — Progress Notes (Signed)
This encounter was created in error - please disregard.

## 2021-12-30 ENCOUNTER — Other Ambulatory Visit (HOSPITAL_COMMUNITY): Payer: Self-pay | Admitting: Cardiology

## 2021-12-30 ENCOUNTER — Encounter: Admit: 2021-12-30 | Discharge: 2021-12-30 | Payer: MEDICARE

## 2021-12-30 DIAGNOSIS — Z96651 Presence of right artificial knee joint: Secondary | ICD-10-CM

## 2021-12-30 DIAGNOSIS — M7989 Other specified soft tissue disorders: Secondary | ICD-10-CM

## 2021-12-30 MED ORDER — OXYCODONE-ACETAMINOPHEN 5-325 MG PO TABS
5-325 MG | ORAL_TABLET | ORAL | 0 refills | Status: AC | PRN
Start: 2021-12-30 — End: 2022-01-07

## 2021-12-30 MED ORDER — METHYLPREDNISOLONE 4 MG PO TBPK
4 MG | PACK | ORAL | 0 refills | Status: AC
Start: 2021-12-30 — End: ?

## 2021-12-30 NOTE — Progress Notes (Signed)
Debra Long (DOB:  01-27-1946) is a Established patient, evaluated via telephone on 12/30/2021    [facility]  [facility]  [address] [name]  [place] [address]  Dept: [phone]  Dept Fax: [phone]   No chief complaint on file.    Patient-Reported Vitals  No data recorded   Allergies   Allergen Reactions    Gadolinium Derivatives Rash and Swelling     Patient reported having rash on face and swelling of eyes the day after administration of MRI contrast on two occasions.    Pt tolerated IV contrast (gadolinium based) with 13 hour prednisone prep and no breakthrough reaction 08/30/14. See PACS images for details.   Patient reported having rash on face and swelling of eyes the day after administration of MRI contrast on two occasions.    Patient reported having rash on face and swelling of eyes the day after administration of MRI contrast on two occasions.         Current Outpatient Medications   Medication Sig Dispense Refill    oxyCODONE-acetaminophen (PERCOCET) 5-325 MG per tablet Take 1 tablet by mouth every 4-6 hours as needed for Pain for up to 8 days. Max Daily Amount: 6 tablets 30 tablet 0    cephALEXin (KEFLEX) 500 MG capsule 1 capsule      hydrOXYzine HCl (ATARAX) 50 MG tablet 1 tablet Orally Once a day at bedtime for 7-10 days for itching for 30 day(s)      QUEtiapine (SEROQUEL) 25 MG tablet TAKE 1 TABLET BY MOUTH EVERY DAY AT BEDTIME FOR SLEEP      traZODone (DESYREL) 100 MG tablet TAKE 1 TABLET BY MOUTH EVERY DAY AT BEDTIME FOR SLEEP      sertraline (ZOLOFT) 25 MG tablet TAKE 1 TABLET BY MOUTH EVERY DAY IN THE MORNING FOR ANXIETY OR STRESS OR DEPRESSION      LORazepam (ATIVAN) 1 MG tablet TAKE 1/2 TO 1 TABLET BY MOUTH TWICE DAILY FOR ANXIETY      donepezil (ARICEPT) 10 MG tablet TAKE 1 TABLET BY MOUTH EVERY DAY AT BEDTIME FOR MEMORY       No current facility-administered medications for this visit.      There is no  problem list on file for this patient.     Family History   Problem Relation Age of Onset    No Known Problems Mother     No Known Problems Father       Social History     Socioeconomic History    Marital status: Unknown   Tobacco Use    Smoking status: Former     Types: Cigarettes     Quit date: 1999     Years since quitting: 24.7    Smokeless tobacco: Never      No past surgical history on file.   No past medical history on file.     I have reviewed and agree with PFSH and ROS and intake form in chart and the record furthermore I have reviewed prior medical record(s) regarding this patients care during this appointment.   Subjective:      Patient presents for postop care following right TKA. Surgery was on 12/03/2021.  Ambulating good with a cane. Pain is controlled with current analgesics.  Medication(s) being used: narcotic analgesics including oxycodone/acetaminophen (Percocet, Tylox)..    Objective:     There were no vitals taken for this visit.  General:  alert, cooperative, no distress, appears stated age   ROM: Left knee - Neurovascularly intact with good cap refill, full range of motion and full strength, well healed incision noted, no swelling, no erythema, no instability.     Right knee - Decrease range of motion with flexion, Some crepitation, Grossly neurovascularly intact, Good cap refill, No skin lesion, Moderate swelling from ankle to foot, No gross instability, Some quadriceps weakness     Incision:   healing well, no drainage, no erythema, incision well approximated, moderate swelling     Assessment:     Doing well postoperatively.    Plan:     1. Continue PT.  2. Wound care/showering discussed.  3. Continue DVT prophylaxis as directed.  4. Medrol dose pack for swelling  5. PVL to rule out DVT given continued ankle swelling and calf soreness.  6. Pt is to increase activities as tolerated.  Follow up 2-3 weeks virtually to ensure patient is continue to progress.   Visit Diagnoses         Codes     Status post total right knee replacement    -  Primary [phone]          As part of continued conservative pain management options the patient was advised to utilize Tylenol or OTC NSAIDS as long as it is not medically contraindicated.   Total Time: minutes: 5-10 minutes  Debra Long was evaluated through a synchronous (real-time) audio encounter. Patient identification was verified at the start of the visit. She (or guardian if applicable) is aware that this is a billable service, which includes applicable co-pays. This visit was conducted with the patient's (and/or legal guardian's) verbal consent. She has not had a related appointment within my department in the past 7 days or scheduled within the next 24 hours.   The patient was located at Home: [place] [address].  The provider was located at [employer] ([place]): [address] [place] [address].    Note: not billable if this call serves to triage the patient into an appointment for the relevant concern    - Gilmar Bua A. Siaosi Alter, APRN, CNP

## 2021-12-31 ENCOUNTER — Encounter: Payer: Self-pay | Admitting: Physical Therapy

## 2021-12-31 ENCOUNTER — Ambulatory Visit (HOSPITAL_COMMUNITY)
Admission: RE | Admit: 2021-12-31 | Discharge: 2021-12-31 | Disposition: A | Discharge status: Home / Self Care | Payer: Medicare Other | Source: Ambulatory Visit | Attending: Cardiology | Admitting: Cardiology

## 2021-12-31 ENCOUNTER — Ambulatory Visit: Payer: Medicare Other | Admitting: Physical Therapy

## 2021-12-31 DIAGNOSIS — M7989 Other specified soft tissue disorders: Secondary | ICD-10-CM | POA: Diagnosis not present

## 2021-12-31 DIAGNOSIS — R262 Difficulty in walking, not elsewhere classified: Secondary | ICD-10-CM | POA: Diagnosis not present

## 2021-12-31 DIAGNOSIS — M6281 Muscle weakness (generalized): Secondary | ICD-10-CM | POA: Diagnosis not present

## 2021-12-31 DIAGNOSIS — R2681 Unsteadiness on feet: Secondary | ICD-10-CM | POA: Diagnosis not present

## 2021-12-31 DIAGNOSIS — M79661 Pain in right lower leg: Secondary | ICD-10-CM | POA: Insufficient documentation

## 2021-12-31 DIAGNOSIS — M545 Low back pain, unspecified: Secondary | ICD-10-CM | POA: Diagnosis not present

## 2021-12-31 DIAGNOSIS — R29898 Other symptoms and signs involving the musculoskeletal system: Secondary | ICD-10-CM | POA: Diagnosis not present

## 2021-12-31 NOTE — Progress Notes (Addendum)
Lower extremity venous has been completed.   Preliminary results in CV Proc.   Attempted to call results, no answer.   Jinny Blossom Kei Mcelhiney 12/31/2021 8:44 AM

## 2021-12-31 NOTE — Therapy (Signed)
OUTPATIENT PHYSICAL THERAPY TREATMENT    Patient Name: [name] MRN: [id number] DOB:1945/03/26, 76 y.o., female Today's Date: 12/31/2021   PT End of Session - 12/31/21 1410     Visit Number 4    Number of Visits 17    Date for PT Re-Evaluation 02/06/22    Authorization Type Medicare    Authorization Time Period 12/05/21-02/06/22    Authorization - Visit Number 4    Authorization - Number of Visits 10    Progress Note Due on Visit 10    PT Start Time 0938    PT Stop Time 1425    PT Time Calculation (min) 40 min    Equipment Utilized During Treatment Gait belt    Activity Tolerance Patient limited by pain;Patient tolerated treatment well    Behavior During Therapy [facility] for tasks assessed/performed              Past Medical History:  Diagnosis Date   Anxiety    Arthritis    Bilateral chronic knee pain    Chronic back pain    Depression    GERD (gastroesophageal reflux disease)    Insomnia    Memory changes    Sinus bradycardia    Urge incontinence    Past Surgical History:  Procedure Laterality Date   ABDOMINAL HYSTERECTOMY  1995   back sugery     lumbar   BIOPSY THYROID     benign goiter   BUNIONECTOMY  2013   rt foot   COLONOSCOPY     MUSCLE BIOPSY Left 10/03/2012   Procedure: LEFT QUADRICEP MUSCLE BIOPSY;  Surgeon: Odis Hollingshead, MD;  Location: [facility];  Service: General;  Laterality: Left;   NECK SURGERY  2010   cerv disc fused    Patient Active Problem List   Diagnosis Date Noted   Chronic back pain 08/07/2019   Depression 08/07/2019   Near syncope 08/06/2019   Sinus bradycardia 08/06/2019   Weakness 11/03/2012    PCP: Merrilee Seashore MD  REFERRING PROVIDER: Roni Bread MD  REFERRING DIAG: Rt TKA Medial approach  THERAPY DIAG:  Difficulty in walking, not elsewhere classified  Muscle weakness (generalized)  Rationale for Evaluation and Treatment Rehabilitation  ONSET DATE: 12/05/21  SUBJECTIVE:    SUBJECTIVE STATEMENT: Pt reports doing better overall, has been trying to drive, reporting this is going well. Reports 6/10 pain today. Using SPC to ambulate, likes this better than the RW. Brought AFO with her, reports unable to wear d/t swelling   PERTINENT HISTORY: Pt is a 76 year old female familiar with this clinic for previous episodes for RLE weakness/R foot drop. Patient with neurological deficits of RLE, particularly foot drop with unknown itiology. Pt presenting this episode with R knee TKA 12/03/21 in [place]. Ambulates into clinic with RW and R AFO donned (noted that AFO is not preventing foot drag).  Pt is currently on prescribed pain medication- trying to not take this due to side affects. Reports current/best pain level 6/10; worst 10/10.  Is wearing wrap around R knee, instructed to wear until returning next week. Wearing compression stockings bilat. Pt reports she has not seen PT yet, nor has any exercises. She has increased pain with all OOB activity, reports she is using RW for all mobility. Pt is retired, enjoyed walking outside at her neighborhood. PLOF modI, driving, completing all community and household ADLs with increased time needed d/t RLE weakness, and with SPC. Pt's husband passed recently and she is  having a hard time with this. Pt denies N/V, B&B changes, unexplained weight fluctuation, saddle paresthesia, fever, night sweats, or unrelenting night pain at this time.   Surgery: the jiffy knee medial approach in [place]  PAIN:  Are you having pain? Yes: NPRS scale: 2/10 Pain location: R knee  Pain description: throbbing, sharp  Aggravating factors: any activity up on her feet Relieving factors: ice, pain medication   PRECAUTIONS: Knee and Fall  WEIGHT BEARING RESTRICTIONS Yes WBAT     TODAY'S TREATMENT: Bike seat 17 > 10 over 5 mins for mobility; RLE moving LLE 116d  -seated heel slides on slider x12 2sec hold with max aarom flex 118d - Seated hamstring curl BluTB 2x  10  with cuing for eccentric control - Seated LAQ 2x 10 with palpable quad activation 75% of TKE -STS from elevated mat with PT stabilizing RLE x6 with cuing for increased RLE push With LLE on foam pad x5 with TC at lateral knee to improve RLE push with decent carry over  Education on DF brace (amazon link given) to be used temporarily until pt swelling is decreased to be fitted for a new brace with pt understanding of foot drop impact on gait and possible fall risk   PATIENT EDUCATION:  Education details: Patient was educated on diagnosis, anatomy and pathology involved, prognosis, role of PT, and was given an HEP, demonstrating exercise with proper form following verbal and tactile cues, and was given a paper hand out to continue exercise at home. Pt was educated on and agreed to plan of care.  Person educated: Patient Education method: Explanation, Demonstration, and Handouts Education comprehension: verbalized understanding and returned demonstration   HOME EXERCISE PROGRAM: [id number]  ASSESSMENT:  CLINICAL IMPRESSION: PT continued therex progression with focus of knee mobility and functional strengthening with success. Pt is able to comply with all cuing for proper technique of therex with good effort throughout session. Pt with increased ROM > last week with evident improvement. PT educated patient on importance of bracing for DF as she currently has severe foot drop with toe drag making her a fall risk with understanding. PT will continue progression as able.    OBJECTIVE IMPAIRMENTS Abnormal gait, decreased activity tolerance, decreased balance, decreased cognition, decreased endurance, decreased knowledge of use of DME, decreased mobility, difficulty walking, decreased ROM, decreased strength, decreased safety awareness, increased fascial restrictions, impaired flexibility, impaired UE functional use, improper body mechanics, postural dysfunction, prosthetic dependency , and pain.    ACTIVITY LIMITATIONS carrying, lifting, bending, sitting, standing, squatting, sleeping, stairs, transfers, bed mobility, bathing, dressing, and locomotion level  PARTICIPATION LIMITATIONS: meal prep, cleaning, laundry, driving, shopping, community activity, and yard work  PERSONAL FACTORS Age, Fitness, Past/current experiences, Sex, Time since onset of injury/illness/exacerbation, Transportation, and 3+ comorbidities: OA, neurological impairment, chronic pain, a&d, GERD  are also affecting patient's functional outcome.   REHAB POTENTIAL: Fair prior impairment limiting optimal function level  CLINICAL DECISION MAKING: Evolving/moderate complexity  EVALUATION COMPLEXITY: Moderate   GOALS: Goals reviewed with patient? Yes  SHORT TERM GOALS: Target date: 01/28/2022  Pt will be independent with HEP in order to improve strength and balance in order to decrease fall risk and improve function at home and work. Baseline:HEP given Goal status: INITIAL   LONG TERM GOALS: Target date:  02/06/22  Pt will decrease worst pain as reported on NPRS by at least 3 points in order to demonstrate clinically significant reduction in pain.  Baseline: 10/10 Goal status: INITIAL  2.  Patient will increase FOTO score to 41 to demonstrate predicted increase in functional mobility to complete ADLs  Baseline: 19 Goal status: INITIAL  3.  Pt will decrease 5TSTS to at least 13 seconds in order to demonstrate age matched norms of LE strength needed for heavy household ADLs  Baseline: unable to complete Goal status: INITIAL  4.  Pt will increase 10MWT to at least 1.0 m/s in order to demonstrate clinically significant decreased impairment and fall risk in community ambulation.   Baseline: 0.65ms Goal status: INITIAL    PLAN: PT FREQUENCY: 1-2x/week  PT DURATION: 8 weeks  PLANNED INTERVENTIONS: Therapeutic exercises, Therapeutic activity, Neuromuscular re-education, Balance training, Gait  training, Patient/Family education, Self Care, Joint mobilization, Joint manipulation, Stair training, DME instructions, Dry Needling, Electrical stimulation, Spinal manipulation, Spinal mobilization, Cryotherapy, Moist heat, Traction, Fluidotherapy, Manual therapy, and Re-evaluation  PLAN FOR NEXT SESSION: HEP re-issue, pt has not brought in an HEP from hospital yet, also need to reassess AFO fitting, pt not wearing due to post surgical edema.   2:31 PM, 12/31/21 AEtta Grandchild PT, DPT Physical Therapist - [phone](Office)   CDurwin RegesDPT  CDurwin Reges PT 12/31/2021, 2:31 PM

## 2022-01-06 ENCOUNTER — Ambulatory Visit: Payer: Medicare Other | Admitting: Physical Therapy

## 2022-01-06 ENCOUNTER — Encounter: Payer: Self-pay | Admitting: Physical Therapy

## 2022-01-06 DIAGNOSIS — R2681 Unsteadiness on feet: Secondary | ICD-10-CM | POA: Diagnosis not present

## 2022-01-06 DIAGNOSIS — M6281 Muscle weakness (generalized): Secondary | ICD-10-CM

## 2022-01-06 DIAGNOSIS — R262 Difficulty in walking, not elsewhere classified: Secondary | ICD-10-CM

## 2022-01-06 DIAGNOSIS — R29898 Other symptoms and signs involving the musculoskeletal system: Secondary | ICD-10-CM | POA: Diagnosis not present

## 2022-01-06 DIAGNOSIS — M545 Low back pain, unspecified: Secondary | ICD-10-CM | POA: Diagnosis not present

## 2022-01-06 NOTE — Therapy (Signed)
OUTPATIENT PHYSICAL THERAPY TREATMENT    Patient Name: [name] MRN: [id number] DOB:10-22-1945, 76 y.o., female Today's Date: 01/06/2022   PT End of Session - 01/06/22 1314     Visit Number 5    Number of Visits 17    Date for PT Re-Evaluation 02/06/22    Authorization Type Medicare    Authorization Time Period 12/05/21-02/06/22    Authorization - Visit Number 5    Authorization - Number of Visits 10    Progress Note Due on Visit 10    PT Start Time 1306    PT Stop Time 1345    PT Time Calculation (min) 39 min    Equipment Utilized During Treatment Gait belt    Activity Tolerance Patient limited by pain;Patient tolerated treatment well    Behavior During Therapy [facility] for tasks assessed/performed               Past Medical History:  Diagnosis Date   Anxiety    Arthritis    Bilateral chronic knee pain    Chronic back pain    Depression    GERD (gastroesophageal reflux disease)    Insomnia    Memory changes    Sinus bradycardia    Urge incontinence    Past Surgical History:  Procedure Laterality Date   ABDOMINAL HYSTERECTOMY  1995   back sugery     lumbar   BIOPSY THYROID     benign goiter   BUNIONECTOMY  2013   rt foot   COLONOSCOPY     MUSCLE BIOPSY Left 10/03/2012   Procedure: LEFT QUADRICEP MUSCLE BIOPSY;  Surgeon: Odis Hollingshead, MD;  Location: [facility];  Service: General;  Laterality: Left;   NECK SURGERY  2010   cerv disc fused    Patient Active Problem List   Diagnosis Date Noted   Chronic back pain 08/07/2019   Depression 08/07/2019   Near syncope 08/06/2019   Sinus bradycardia 08/06/2019   Weakness 11/03/2012    PCP: Merrilee Seashore MD  REFERRING PROVIDER: Roni Bread MD  REFERRING DIAG: Rt TKA Medial approach  THERAPY DIAG:  Difficulty in walking, not elsewhere classified  Muscle weakness (generalized)  Rationale for Evaluation and Treatment Rehabilitation  ONSET DATE: 12/05/21  SUBJECTIVE:    SUBJECTIVE STATEMENT: Pt reports to PT with 3/10 pain. Reports her knee is swollen, she has not been icing "as much as she should". Home exercises are going "okay".   PERTINENT HISTORY: Pt is a 76 year old female familiar with this clinic for previous episodes for RLE weakness/R foot drop. Patient with neurological deficits of RLE, particularly foot drop with unknown itiology. Pt presenting this episode with R knee TKA 12/03/21 in [place]. Ambulates into clinic with RW and R AFO donned (noted that AFO is not preventing foot drag).  Pt is currently on prescribed pain medication- trying to not take this due to side affects. Reports current/best pain level 6/10; worst 10/10.  Is wearing wrap around R knee, instructed to wear until returning next week. Wearing compression stockings bilat. Pt reports she has not seen PT yet, nor has any exercises. She has increased pain with all OOB activity, reports she is using RW for all mobility. Pt is retired, enjoyed walking outside at her neighborhood. PLOF modI, driving, completing all community and household ADLs with increased time needed d/t RLE weakness, and with SPC. Pt's husband passed recently and she is having a hard time with this. Pt denies N/V, B&B changes,  unexplained weight fluctuation, saddle paresthesia, fever, night sweats, or unrelenting night pain at this time.   Surgery: the jiffy knee medial approach in [place]  PAIN:  Are you having pain? Yes: NPRS scale: 2/10 Pain location: R knee  Pain description: throbbing, sharp  Aggravating factors: any activity up on her feet Relieving factors: ice, pain medication   PRECAUTIONS: Knee and Fall  WEIGHT BEARING RESTRICTIONS Yes WBAT     TODAY'S TREATMENT: Bike seat 9 > 8 over 5 mins for mobility; RLE moving LLE 116d  STS from elevated mat x8; with LLE on foam pad 2x 6 with max cuing for increased RLE effort - Seated LAQ 2# AW 2x 10 with palpable quad activation 75% of TKE -Step up onto 6in 2x 6 with  unilateral UE support supervision for safety - Sidestepping 49f x2 R and L HHA for safety, cuing for full L lift for increased RLE demand with good carry over Hamstring stretch 30sec    PATIENT EDUCATION:  Education details: Patient was educated on diagnosis, anatomy and pathology involved, prognosis, role of PT, and was given an HEP, demonstrating exercise with proper form following verbal and tactile cues, and was given a paper hand out to continue exercise at home. Pt was educated on and agreed to plan of care.  Person educated: Patient Education method: Explanation, Demonstration, and Handouts Education comprehension: verbalized understanding and returned demonstration   HOME EXERCISE PROGRAM: [id number] ASSESSMENT:  CLINICAL IMPRESSION: PT continued therex progression with focus of knee mobility and functional strengthening with success. Pt is able to comply with all cuing for proper technique of therex with good effort throughout session. Pt continues to be limited by neurological weakness and R foot drop. PT will continue progression as able.    OBJECTIVE IMPAIRMENTS Abnormal gait, decreased activity tolerance, decreased balance, decreased cognition, decreased endurance, decreased knowledge of use of DME, decreased mobility, difficulty walking, decreased ROM, decreased strength, decreased safety awareness, increased fascial restrictions, impaired flexibility, impaired UE functional use, improper body mechanics, postural dysfunction, prosthetic dependency , and pain.   ACTIVITY LIMITATIONS carrying, lifting, bending, sitting, standing, squatting, sleeping, stairs, transfers, bed mobility, bathing, dressing, and locomotion level  PARTICIPATION LIMITATIONS: meal prep, cleaning, laundry, driving, shopping, community activity, and yard work  PERSONAL FACTORS Age, Fitness, Past/current experiences, Sex, Time since onset of injury/illness/exacerbation, Transportation, and 3+  comorbidities: OA, neurological impairment, chronic pain, a&d, GERD  are also affecting patient's functional outcome.   REHAB POTENTIAL: Fair prior impairment limiting optimal function level  CLINICAL DECISION MAKING: Evolving/moderate complexity  EVALUATION COMPLEXITY: Moderate   GOALS: Goals reviewed with patient? Yes  SHORT TERM GOALS: Target date: 02/03/2022  Pt will be independent with HEP in order to improve strength and balance in order to decrease fall risk and improve function at home and work. Baseline:HEP given Goal status: INITIAL   LONG TERM GOALS: Target date:  02/06/22  Pt will decrease worst pain as reported on NPRS by at least 3 points in order to demonstrate clinically significant reduction in pain.  Baseline: 10/10 Goal status: INITIAL  2.  Patient will increase FOTO score to 41 to demonstrate predicted increase in functional mobility to complete ADLs  Baseline: 19 Goal status: INITIAL  3.  Pt will decrease 5TSTS to at least 13 seconds in order to demonstrate age matched norms of LE strength needed for heavy household ADLs  Baseline: unable to complete Goal status: INITIAL  4.  Pt will increase 10MWT to at least 1.0 m/s in  order to demonstrate clinically significant decreased impairment and fall risk in community ambulation.   Baseline: 0.60ms Goal status: INITIAL    PLAN: PT FREQUENCY: 1-2x/week  PT DURATION: 8 weeks  PLANNED INTERVENTIONS: Therapeutic exercises, Therapeutic activity, Neuromuscular re-education, Balance training, Gait training, Patient/Family education, Self Care, Joint mobilization, Joint manipulation, Stair training, DME instructions, Dry Needling, Electrical stimulation, Spinal manipulation, Spinal mobilization, Cryotherapy, Moist heat, Traction, Fluidotherapy, Manual therapy, and Re-evaluation  PLAN FOR NEXT SESSION: HEP re-issue, pt has not brought in an HEP from hospital yet, also need to reassess AFO fitting, pt not wearing  due to post surgical edema.      CDurwin RegesDPT  CDurwin Reges PT 01/06/2022, 1:48 PM

## 2022-01-09 ENCOUNTER — Ambulatory Visit: Payer: Medicare Other | Admitting: Physical Therapy

## 2022-01-12 ENCOUNTER — Ambulatory Visit: Payer: Medicare Other | Admitting: Physical Therapy

## 2022-01-16 ENCOUNTER — Encounter: Payer: Self-pay | Admitting: Physical Therapy

## 2022-01-16 ENCOUNTER — Ambulatory Visit: Payer: Medicare Other | Admitting: Physical Therapy

## 2022-01-16 DIAGNOSIS — R262 Difficulty in walking, not elsewhere classified: Secondary | ICD-10-CM

## 2022-01-16 DIAGNOSIS — M545 Low back pain, unspecified: Secondary | ICD-10-CM | POA: Diagnosis not present

## 2022-01-16 DIAGNOSIS — M6281 Muscle weakness (generalized): Secondary | ICD-10-CM | POA: Diagnosis not present

## 2022-01-16 DIAGNOSIS — R2681 Unsteadiness on feet: Secondary | ICD-10-CM | POA: Diagnosis not present

## 2022-01-16 DIAGNOSIS — R29898 Other symptoms and signs involving the musculoskeletal system: Secondary | ICD-10-CM | POA: Diagnosis not present

## 2022-01-16 NOTE — Therapy (Signed)
OUTPATIENT PHYSICAL THERAPY TREATMENT    Patient Name: [name] MRN: [id number] DOB:04-May-1945, 76 y.o., female Today's Date: 01/16/2022   PT End of Session - 01/16/22 1047     Visit Number 6    Number of Visits 17    Date for PT Re-Evaluation 02/06/22    Authorization Type Medicare    Authorization Time Period 12/05/21-02/06/22    Authorization - Visit Number 6    Authorization - Number of Visits 10    Progress Note Due on Visit 10    PT Start Time 0177    PT Stop Time 1132    PT Time Calculation (min) 43 min    Equipment Utilized During Treatment Gait belt    Activity Tolerance Patient limited by pain;Patient tolerated treatment well    Behavior During Therapy [facility] for tasks assessed/performed                Past Medical History:  Diagnosis Date   Anxiety    Arthritis    Bilateral chronic knee pain    Chronic back pain    Depression    GERD (gastroesophageal reflux disease)    Insomnia    Memory changes    Sinus bradycardia    Urge incontinence    Past Surgical History:  Procedure Laterality Date   ABDOMINAL HYSTERECTOMY  1995   back sugery     lumbar   BIOPSY THYROID     benign goiter   BUNIONECTOMY  2013   rt foot   COLONOSCOPY     MUSCLE BIOPSY Left 10/03/2012   Procedure: LEFT QUADRICEP MUSCLE BIOPSY;  Surgeon: Odis Hollingshead, MD;  Location: [place];  Service: General;  Laterality: Left;   NECK SURGERY  2010   cerv disc fused    Patient Active Problem List   Diagnosis Date Noted   Chronic back pain 08/07/2019   Depression 08/07/2019   Near syncope 08/06/2019   Sinus bradycardia 08/06/2019   Weakness 11/03/2012    PCP: Merrilee Seashore MD  REFERRING PROVIDER: Roni Bread MD  REFERRING DIAG: Rt TKA Medial approach  THERAPY DIAG:  Difficulty in walking, not elsewhere classified  Muscle weakness (generalized)  Unsteadiness on feet  Rationale for Evaluation and Treatment Rehabilitation  ONSET DATE:  12/05/21  SUBJECTIVE:   SUBJECTIVE STATEMENT: Pt reports to PT with 8/10 pain this morning. Pt reports not icing knee as much due to decreased swelling. Pt reports compliance with HEP with pain during some of the exercises. Pt reports no falls since last treatment.  PERTINENT HISTORY: Pt is a 76 year old female familiar with this clinic for previous episodes for RLE weakness/R foot drop. Patient with neurological deficits of RLE, particularly foot drop with unknown itiology. Pt presenting this episode with R knee TKA 12/03/21 in [place]. Ambulates into clinic with RW and R AFO donned (noted that AFO is not preventing foot drag).  Pt is currently on prescribed pain medication- trying to not take this due to side affects. Reports current/best pain level 6/10; worst 10/10.  Is wearing wrap around R knee, instructed to wear until returning next week. Wearing compression stockings bilat. Pt reports she has not seen PT yet, nor has any exercises. She has increased pain with all OOB activity, reports she is using RW for all mobility. Pt is retired, enjoyed walking outside at her neighborhood. PLOF modI, driving, completing all community and household ADLs with increased time needed d/t RLE weakness, and with SPC. Pt's husband  passed recently and she is having a hard time with this. Pt denies N/V, B&B changes, unexplained weight fluctuation, saddle paresthesia, fever, night sweats, or unrelenting night pain at this time.   Surgery: the jiffy knee medial approach in [place]  PAIN:  Are you having pain? Yes: NPRS scale: 2/10 Pain location: R knee  Pain description: throbbing, sharp  Aggravating factors: any activity up on her feet Relieving factors: ice, pain medication   PRECAUTIONS: Knee and Fall  WEIGHT BEARING RESTRICTIONS Yes WBAT     TODAY'S TREATMENT: Therex: Bike seat 9 > 8 over 5 mins for mobility; RLE moving LLE Pt cued to try to go all the way around with RLE Step ups 1x 6 inch step 10 reps pt  unable to clear step without swinging RLE around Step ups 1x 6 reps 4 inch step 7 reps Lateral stepups 2x 4 inch step 7 reps pt cued for big step to allow for space for L foot placement Unsupported Standing exercises: 30 seconds apeice NBOS Narrow BOS (pt unable to touch feet together because of Rknee pain) Semitandem R in front (pt able to have feet touching, cued for upright posture) Semitandem L in front (BLE beginning to fatigue, burning sensation) STS from chair x8 throughout, with max cuing for increased RLE effort  Gait: 150 feet of gait training with new brace from [facility] that clips onto tennis shoe; with new brace donned toe drag on RLE improved from no brace. Toe drop (dragging foot) still shows evidence for rigid dorsiflexion AFO, referral sent. Pt education and mirrored demonstration of donning/doffing brace   PATIENT EDUCATION:  Education details: Patient was educated on diagnosis, anatomy and pathology involved, prognosis, role of PT, and was given an HEP, demonstrating exercise with proper form following verbal and tactile cues, and was given a paper hand out to continue exercise at home. Pt was educated on and agreed to plan of care.  Person educated: Patient Education method: Explanation, Demonstration, and Handouts Education comprehension: verbalized understanding and returned demonstration   HOME EXERCISE PROGRAM: [id number]  ASSESSMENT:  CLINICAL IMPRESSION: PT continued therapeutic exercise to increase knee mobility and functional strength. Pt is able to comply with most activities with minimal cueing. Pt fatigued with standing and WB activities, pt attributes fatigue to a combination of R knee pain and bilateral LE muscle tiredness. Pt with increased foot clearance with non-spefic brace for DF; would benefit from rigid DF AFO to prevent fall risk- referral sent this date for this. Pt will continue to benefit from skilled physical therapy to improve R knee ROM and  decrease pain levels to return to PLOF.   OBJECTIVE IMPAIRMENTS Abnormal gait, decreased activity tolerance, decreased balance, decreased cognition, decreased endurance, decreased knowledge of use of DME, decreased mobility, difficulty walking, decreased ROM, decreased strength, decreased safety awareness, increased fascial restrictions, impaired flexibility, impaired UE functional use, improper body mechanics, postural dysfunction, prosthetic dependency , and pain.   ACTIVITY LIMITATIONS carrying, lifting, bending, sitting, standing, squatting, sleeping, stairs, transfers, bed mobility, bathing, dressing, and locomotion level  PARTICIPATION LIMITATIONS: meal prep, cleaning, laundry, driving, shopping, community activity, and yard work  PERSONAL FACTORS Age, Fitness, Past/current experiences, Sex, Time since onset of injury/illness/exacerbation, Transportation, and 3+ comorbidities: OA, neurological impairment, chronic pain, a&d, GERD  are also affecting patient's functional outcome.   REHAB POTENTIAL: Fair prior impairment limiting optimal function level  CLINICAL DECISION MAKING: Evolving/moderate complexity  EVALUATION COMPLEXITY: Moderate   GOALS: Goals reviewed with patient? Yes  SHORT TERM GOALS:  Target date: 02/13/2022  Pt will be independent with HEP in order to improve strength and balance in order to decrease fall risk and improve function at home and work. Baseline:HEP given Goal status: INITIAL   LONG TERM GOALS: Target date:  02/06/22  Pt will decrease worst pain as reported on NPRS by at least 3 points in order to demonstrate clinically significant reduction in pain.  Baseline: 10/10 Goal status: INITIAL  2.  Patient will increase FOTO score to 41 to demonstrate predicted increase in functional mobility to complete ADLs  Baseline: 19 Goal status: INITIAL  3.  Pt will decrease 5TSTS to at least 13 seconds in order to demonstrate age matched norms of LE strength  needed for heavy household ADLs  Baseline: unable to complete Goal status: INITIAL  4.  Pt will increase 10MWT to at least 1.0 m/s in order to demonstrate clinically significant decreased impairment and fall risk in community ambulation.   Baseline: 0.34ms Goal status: INITIAL    PLAN: PT FREQUENCY: 1-2x/week  PT DURATION: 8 weeks  PLANNED INTERVENTIONS: Therapeutic exercises, Therapeutic activity, Neuromuscular re-education, Balance training, Gait training, Patient/Family education, Self Care, Joint mobilization, Joint manipulation, Stair training, DME instructions, Dry Needling, Electrical stimulation, Spinal manipulation, Spinal mobilization, Cryotherapy, Moist heat, Traction, Fluidotherapy, Manual therapy, and Re-evaluation  PLAN FOR NEXT SESSION: HEP re-issue, pt has not brought in an HEP from hospital yet, also need to reassess AFO fitting, pt not wearing due to post surgical edema.      CDurwin RegesDPT  CDurwin Reges PT 01/16/2022, 11:49 AM

## 2022-01-19 ENCOUNTER — Ambulatory Visit: Payer: Medicare Other | Admitting: Physical Therapy

## 2022-01-20 ENCOUNTER — Ambulatory Visit: Payer: Medicare Other | Admitting: Physical Therapy

## 2022-01-21 ENCOUNTER — Encounter: Payer: Medicare Other | Admitting: Physical Therapy

## 2022-01-23 ENCOUNTER — Ambulatory Visit: Payer: Medicare Other | Admitting: Physical Therapy

## 2022-01-27 ENCOUNTER — Ambulatory Visit: Payer: Medicare Other | Admitting: Physical Therapy

## 2022-01-28 ENCOUNTER — Telehealth: Payer: Self-pay | Admitting: Physical Therapy

## 2022-01-28 ENCOUNTER — Ambulatory Visit: Payer: Medicare Other | Attending: Orthopedic Surgery | Admitting: Physical Therapy

## 2022-01-28 DIAGNOSIS — R2681 Unsteadiness on feet: Secondary | ICD-10-CM | POA: Insufficient documentation

## 2022-01-28 DIAGNOSIS — R262 Difficulty in walking, not elsewhere classified: Secondary | ICD-10-CM | POA: Insufficient documentation

## 2022-01-28 DIAGNOSIS — M6281 Muscle weakness (generalized): Secondary | ICD-10-CM | POA: Insufficient documentation

## 2022-01-28 NOTE — Telephone Encounter (Signed)
Pt thought appt was tomorrow- rescheduled to tomorrow

## 2022-01-29 ENCOUNTER — Encounter: Payer: Self-pay | Admitting: Physical Therapy

## 2022-01-29 ENCOUNTER — Ambulatory Visit: Payer: Medicare Other | Admitting: Physical Therapy

## 2022-01-29 DIAGNOSIS — M6281 Muscle weakness (generalized): Secondary | ICD-10-CM

## 2022-01-29 DIAGNOSIS — R262 Difficulty in walking, not elsewhere classified: Secondary | ICD-10-CM | POA: Diagnosis not present

## 2022-01-29 DIAGNOSIS — R2681 Unsteadiness on feet: Secondary | ICD-10-CM | POA: Diagnosis not present

## 2022-01-29 NOTE — Therapy (Addendum)
OUTPATIENT PHYSICAL THERAPY TREATMENT    Patient Name: [name] MRN: [id number] DOB:1945/07/29, 76 y.o., female Today's Date: 01/29/2022   PT End of Session - 01/29/22 1352     Visit Number 7    Number of Visits 17    Date for PT Re-Evaluation 02/06/22    Authorization Type Medicare    Authorization Time Period 12/05/21-02/06/22    Authorization - Visit Number 7    Authorization - Number of Visits 10    Progress Note Due on Visit 10    PT Start Time 1350    PT Stop Time 1428    PT Time Calculation (min) 38 min    Activity Tolerance Patient limited by pain;Patient tolerated treatment well    Behavior During Therapy [facility] for tasks assessed/performed                Past Medical History:  Diagnosis Date   Anxiety    Arthritis    Bilateral chronic knee pain    Chronic back pain    Depression    GERD (gastroesophageal reflux disease)    Insomnia    Memory changes    Sinus bradycardia    Urge incontinence    Past Surgical History:  Procedure Laterality Date   ABDOMINAL HYSTERECTOMY  1995   back sugery     lumbar   BIOPSY THYROID     benign goiter   BUNIONECTOMY  2013   rt foot   COLONOSCOPY     MUSCLE BIOPSY Left 10/03/2012   Procedure: LEFT QUADRICEP MUSCLE BIOPSY;  Surgeon: Odis Hollingshead, MD;  Location: [place];  Service: General;  Laterality: Left;   NECK SURGERY  2010   cerv disc fused    Patient Active Problem List   Diagnosis Date Noted   Chronic back pain 08/07/2019   Depression 08/07/2019   Near syncope 08/06/2019   Sinus bradycardia 08/06/2019   Weakness 11/03/2012    PCP: Merrilee Seashore MD  REFERRING PROVIDER: Roni Bread MD  REFERRING DIAG: Rt TKA Medial approach  THERAPY DIAG:  Difficulty in walking, not elsewhere classified  Muscle weakness (generalized)  Rationale for Evaluation and Treatment Rehabilitation  ONSET DATE: 12/05/21  SUBJECTIVE:   SUBJECTIVE STATEMENT: Pt reports minimal pain  today. Is currently wearing her brace, feels like it is not helping her very much. Reports compliance with HEP. No falls since last visit.   PERTINENT HISTORY: Pt is a 76 year old female familiar with this clinic for previous episodes for RLE weakness/R foot drop. Patient with neurological deficits of RLE, particularly foot drop with unknown itiology. Pt presenting this episode with R knee TKA 12/03/21 in [place]. Ambulates into clinic with RW and R AFO donned (noted that AFO is not preventing foot drag).  Pt is currently on prescribed pain medication- trying to not take this due to side affects. Reports current/best pain level 6/10; worst 10/10.  Is wearing wrap around R knee, instructed to wear until returning next week. Wearing compression stockings bilat. Pt reports she has not seen PT yet, nor has any exercises. She has increased pain with all OOB activity, reports she is using RW for all mobility. Pt is retired, enjoyed walking outside at her neighborhood. PLOF modI, driving, completing all community and household ADLs with increased time needed d/t RLE weakness, and with SPC. Pt's husband passed recently and she is having a hard time with this. Pt denies N/V, B&B changes, unexplained weight fluctuation, saddle paresthesia, fever, night  sweats, or unrelenting night pain at this time.   Surgery: the jiffy knee medial approach in [place]  PAIN:  Are you having pain? Yes: NPRS scale: 2/10 Pain location: R knee  Pain description: throbbing, sharp  Aggravating factors: any activity up on her feet Relieving factors: ice, pain medication   PRECAUTIONS: Knee and Fall  WEIGHT BEARING RESTRICTIONS Yes WBAT     TODAY'S TREATMENT: Therex: Bike seat  8 L3 over 5 mins for mobility and gentle strengthening With LLE on foam sts from elevated mat 3x 6/8/8 with TC to push through RLE with good carry over DF brace on correctly, lack of available DF making brace less effective Heel raise from step with eccentric  lower 2x 12 cuing for foot placement with good carry over Ambulation with 3# AW on LLE 2x 175f/50ft with cuing for maximum foot clearance with good carry over; fatigue by end of set Gastroc stretch on wedge 30sec        PATIENT EDUCATION:  Education details: Patient was educated on diagnosis, anatomy and pathology involved, prognosis, role of PT, and was given an HEP, demonstrating exercise with proper form following verbal and tactile cues, and was given a paper hand out to continue exercise at home. Pt was educated on and agreed to plan of care.  Person educated: Patient Education method: Explanation, Demonstration, and Handouts Education comprehension: verbalized understanding and returned demonstration   HOME EXERCISE PROGRAM: [id number] ASSESSMENT:  CLINICAL IMPRESSION: PT continued therapeutic exercise to increase knee mobility and functional strength. Pt wearing brace with some improvement in foot clearance, but lack of available DF PROM decreasing brace success. PT gave pt DF stretch to aid with this. Patient is able to comply with all cuing for proper technique of therex with good motivation and no increased pain throughout session. Patient demonstrating excellent ROM. Pt will continue to benefit from skilled physical therapy to improve R knee ROM and decrease pain levels to return to PLOF.   OBJECTIVE IMPAIRMENTS Abnormal gait, decreased activity tolerance, decreased balance, decreased cognition, decreased endurance, decreased knowledge of use of DME, decreased mobility, difficulty walking, decreased ROM, decreased strength, decreased safety awareness, increased fascial restrictions, impaired flexibility, impaired UE functional use, improper body mechanics, postural dysfunction, prosthetic dependency , and pain.   ACTIVITY LIMITATIONS carrying, lifting, bending, sitting, standing, squatting, sleeping, stairs, transfers, bed mobility, bathing, dressing, and locomotion  level  PARTICIPATION LIMITATIONS: meal prep, cleaning, laundry, driving, shopping, community activity, and yard work  PERSONAL FACTORS Age, Fitness, Past/current experiences, Sex, Time since onset of injury/illness/exacerbation, Transportation, and 3+ comorbidities: OA, neurological impairment, chronic pain, a&d, GERD  are also affecting patient's functional outcome.   REHAB POTENTIAL: Fair prior impairment limiting optimal function level  CLINICAL DECISION MAKING: Evolving/moderate complexity  EVALUATION COMPLEXITY: Moderate   GOALS: Goals reviewed with patient? Yes  SHORT TERM GOALS: Target date: 02/26/2022  Pt will be independent with HEP in order to improve strength and balance in order to decrease fall risk and improve function at home and work. Baseline:HEP given Goal status: INITIAL   LONG TERM GOALS: Target date:  02/06/22  Pt will decrease worst pain as reported on NPRS by at least 3 points in order to demonstrate clinically significant reduction in pain.  Baseline: 10/10 Goal status: INITIAL  2.  Patient will increase FOTO score to 41 to demonstrate predicted increase in functional mobility to complete ADLs  Baseline: 19 Goal status: INITIAL  3.  Pt will decrease 5TSTS to at least 13 seconds  in order to demonstrate age matched norms of LE strength needed for heavy household ADLs  Baseline: unable to complete Goal status: INITIAL  4.  Pt will increase 10MWT to at least 1.0 m/s in order to demonstrate clinically significant decreased impairment and fall risk in community ambulation.   Baseline: 0.39ms Goal status: INITIAL    PLAN: PT FREQUENCY: 1-2x/week  PT DURATION: 8 weeks  PLANNED INTERVENTIONS: Therapeutic exercises, Therapeutic activity, Neuromuscular re-education, Balance training, Gait training, Patient/Family education, Self Care, Joint mobilization, Joint manipulation, Stair training, DME instructions, Dry Needling, Electrical stimulation, Spinal  manipulation, Spinal mobilization, Cryotherapy, Moist heat, Traction, Fluidotherapy, Manual therapy, and Re-evaluation  PLAN FOR NEXT SESSION: HEP re-issue, pt has not brought in an HEP from hospital yet, also need to reassess AFO fitting, pt not wearing due to post surgical edema.      CDurwin RegesDPT  CDurwin Reges PT 01/29/2022, 3:08 PM

## 2022-02-03 ENCOUNTER — Encounter: Payer: Medicare Other | Admitting: Physical Therapy

## 2022-02-05 ENCOUNTER — Ambulatory Visit: Payer: Medicare Other | Admitting: Physical Therapy

## 2022-02-05 ENCOUNTER — Encounter: Payer: Medicare Other | Admitting: Physical Therapy

## 2022-02-10 ENCOUNTER — Ambulatory Visit: Payer: Medicare Other | Admitting: Physical Therapy

## 2022-02-10 ENCOUNTER — Encounter: Payer: Medicare Other | Admitting: Physical Therapy

## 2022-02-10 ENCOUNTER — Encounter: Payer: Self-pay | Admitting: Physical Therapy

## 2022-02-10 DIAGNOSIS — M6281 Muscle weakness (generalized): Secondary | ICD-10-CM

## 2022-02-10 DIAGNOSIS — R262 Difficulty in walking, not elsewhere classified: Secondary | ICD-10-CM | POA: Diagnosis not present

## 2022-02-10 DIAGNOSIS — R2681 Unsteadiness on feet: Secondary | ICD-10-CM

## 2022-02-10 NOTE — Therapy (Signed)
OUTPATIENT PHYSICAL THERAPY TREATMENT    Patient Name: [name] MRN: [id number] DOB:1945/11/03, 76 y.o., female Today's Date: 02/11/2022   PT End of Session - 02/10/22 1008     Visit Number 8    Number of Visits 17    Date for PT Re-Evaluation 02/06/22    Authorization Type Medicare    Authorization Time Period 12/05/21-02/06/22    Authorization - Visit Number 8    Authorization - Number of Visits 10    Progress Note Due on Visit 10    PT Start Time 8546    PT Stop Time 1045    PT Time Calculation (min) 38 min    Equipment Utilized During Treatment Gait belt    Activity Tolerance Patient limited by pain;Patient tolerated treatment well    Behavior During Therapy [facility] for tasks assessed/performed                 Past Medical History:  Diagnosis Date   Anxiety    Arthritis    Bilateral chronic knee pain    Chronic back pain    Depression    GERD (gastroesophageal reflux disease)    Insomnia    Memory changes    Sinus bradycardia    Urge incontinence    Past Surgical History:  Procedure Laterality Date   ABDOMINAL HYSTERECTOMY  1995   back sugery     lumbar   BIOPSY THYROID     benign goiter   BUNIONECTOMY  2013   rt foot   COLONOSCOPY     MUSCLE BIOPSY Left 10/03/2012   Procedure: LEFT QUADRICEP MUSCLE BIOPSY;  Surgeon: Odis Hollingshead, MD;  Location: [place];  Service: General;  Laterality: Left;   NECK SURGERY  2010   cerv disc fused    Patient Active Problem List   Diagnosis Date Noted   Chronic back pain 08/07/2019   Depression 08/07/2019   Near syncope 08/06/2019   Sinus bradycardia 08/06/2019   Weakness 11/03/2012    PCP: Merrilee Seashore MD  REFERRING PROVIDER: Roni Bread MD  REFERRING DIAG: Rt TKA Medial approach  THERAPY DIAG:  Difficulty in walking, not elsewhere classified  Muscle weakness (generalized)  Unsteadiness on feet  Rationale for Evaluation and Treatment Rehabilitation  ONSET DATE:  12/05/21  SUBJECTIVE:   SUBJECTIVE STATEMENT: Pt reports 5/10 pain upon presentation. Pt reports foot still acting "lazy," but no falls since previous session. Pt reports HEP compliance every other day. Pt feels she is getting better with decreased pain levels.  PERTINENT HISTORY: Pt is a 76 year old female familiar with this clinic for previous episodes for RLE weakness/R foot drop. Patient with neurological deficits of RLE, particularly foot drop with unknown itiology. Pt presenting this episode with R knee TKA 12/03/21 in [place]. Ambulates into clinic with RW and R AFO donned (noted that AFO is not preventing foot drag).  Pt is currently on prescribed pain medication- trying to not take this due to side affects. Reports current/best pain level 6/10; worst 10/10.  Is wearing wrap around R knee, instructed to wear until returning next week. Wearing compression stockings bilat. Pt reports she has not seen PT yet, nor has any exercises. She has increased pain with all OOB activity, reports she is using RW for all mobility. Pt is retired, enjoyed walking outside at her neighborhood. PLOF modI, driving, completing all community and household ADLs with increased time needed d/t RLE weakness, and with SPC. Pt's husband passed recently and  she is having a hard time with this. Pt denies N/V, B&B changes, unexplained weight fluctuation, saddle paresthesia, fever, night sweats, or unrelenting night pain at this time.   Surgery: the jiffy knee medial approach in [place]  PAIN:  Are you having pain? Yes: NPRS scale: 2/10 Pain location: R knee  Pain description: throbbing, sharp  Aggravating factors: any activity up on her feet Relieving factors: ice, pain medication   PRECAUTIONS: Knee and Fall  WEIGHT BEARING RESTRICTIONS Yes WBAT     TODAY'S TREATMENT: Therex: Bike seat  13 L3 over 5 mins for mobility and gentle strengthening With LLE on foam sts from elevated mat 3x 6/8/8 with TC to push through RLE with  good carry over Supine Bridge 3 sets of 10 reps; pt cued for core activation and slow decent. Supine straight leg raise, 3 sets of 8, pt only able to complete eccentric contraction first set, 2nd and 3rd set 50% FROM with concentric contraction  100 ft walking with 4 pound AW on affected leg; pt cued for increased hip and knee flexion for increased foot clearance      PATIENT EDUCATION:  Education details: Patient was educated on diagnosis, anatomy and pathology involved, prognosis, role of PT, and was given an HEP, demonstrating exercise with proper form following verbal and tactile cues, and was given a paper hand out to continue exercise at home. Pt was educated on and agreed to plan of care.  Person educated: Patient Education method: Explanation, Demonstration, and Handouts Education comprehension: verbalized understanding and returned demonstration   HOME EXERCISE PROGRAM: [id number]  ASSESSMENT:  CLINICAL IMPRESSION: PT continued therapeutic exercise to increase mobility and strength of knee for increased level of function. Pt still requires cueing for proper hip and knee flexion to clear foot during gait cycle despite having DF brace today. Pt requires verbal and tactile cueing for straight leg raise movement, pt able to initiate movement after first set PT being heavily involved. Knee ROM is being maintained. Patient will continue to benefit from skilled physical therapy to improve strength of bilateral LE and progress knee ROM for improved independence with ADL and community ambulation.   OBJECTIVE IMPAIRMENTS Abnormal gait, decreased activity tolerance, decreased balance, decreased cognition, decreased endurance, decreased knowledge of use of DME, decreased mobility, difficulty walking, decreased ROM, decreased strength, decreased safety awareness, increased fascial restrictions, impaired flexibility, impaired UE functional use, improper body mechanics, postural dysfunction,  prosthetic dependency , and pain.   ACTIVITY LIMITATIONS carrying, lifting, bending, sitting, standing, squatting, sleeping, stairs, transfers, bed mobility, bathing, dressing, and locomotion level  PARTICIPATION LIMITATIONS: meal prep, cleaning, laundry, driving, shopping, community activity, and yard work  PERSONAL FACTORS Age, Fitness, Past/current experiences, Sex, Time since onset of injury/illness/exacerbation, Transportation, and 3+ comorbidities: OA, neurological impairment, chronic pain, a&d, GERD  are also affecting patient's functional outcome.   REHAB POTENTIAL: Fair prior impairment limiting optimal function level  CLINICAL DECISION MAKING: Evolving/moderate complexity  EVALUATION COMPLEXITY: Moderate   GOALS: Goals reviewed with patient? Yes  SHORT TERM GOALS: Target date: 03/11/2022  Pt will be independent with HEP in order to improve strength and balance in order to decrease fall risk and improve function at home and work. Baseline:HEP given Goal status: INITIAL   LONG TERM GOALS: Target date:  02/06/22  Pt will decrease worst pain as reported on NPRS by at least 3 points in order to demonstrate clinically significant reduction in pain.  Baseline: 10/10 Goal status: INITIAL  2.  Patient will increase FOTO  score to 41 to demonstrate predicted increase in functional mobility to complete ADLs  Baseline: 19 Goal status: INITIAL  3.  Pt will decrease 5TSTS to at least 13 seconds in order to demonstrate age matched norms of LE strength needed for heavy household ADLs  Baseline: unable to complete Goal status: INITIAL  4.  Pt will increase 10MWT to at least 1.0 m/s in order to demonstrate clinically significant decreased impairment and fall risk in community ambulation.   Baseline: 0.18ms Goal status: INITIAL    PLAN: PT FREQUENCY: 1-2x/week  PT DURATION: 8 weeks  PLANNED INTERVENTIONS: Therapeutic exercises, Therapeutic activity, Neuromuscular  re-education, Balance training, Gait training, Patient/Family education, Self Care, Joint mobilization, Joint manipulation, Stair training, DME instructions, Dry Needling, Electrical stimulation, Spinal manipulation, Spinal mobilization, Cryotherapy, Moist heat, Traction, Fluidotherapy, Manual therapy, and Re-evaluation  PLAN FOR NEXT SESSION: HEP re-issue, pt has not brought in an HEP from hospital yet, also need to reassess AFO fitting, pt not wearing due to post surgical edema.      CDurwin RegesDPT  CDurwin Reges PT 02/11/2022, 10:53 AM

## 2022-02-11 ENCOUNTER — Encounter: Payer: Self-pay | Admitting: Physical Therapy

## 2022-02-17 ENCOUNTER — Ambulatory Visit: Payer: Medicare Other | Admitting: Physical Therapy

## 2022-02-19 ENCOUNTER — Encounter: Payer: Self-pay | Admitting: Physical Therapy

## 2022-02-19 ENCOUNTER — Ambulatory Visit: Payer: Medicare Other | Admitting: Physical Therapy

## 2022-02-19 DIAGNOSIS — R2681 Unsteadiness on feet: Secondary | ICD-10-CM

## 2022-02-19 DIAGNOSIS — R262 Difficulty in walking, not elsewhere classified: Secondary | ICD-10-CM | POA: Diagnosis not present

## 2022-02-19 DIAGNOSIS — M6281 Muscle weakness (generalized): Secondary | ICD-10-CM | POA: Diagnosis not present

## 2022-02-19 NOTE — Therapy (Signed)
OUTPATIENT PHYSICAL THERAPY TREATMENT    Patient Name: [name] MRN: [id number] DOB:07/01/1945, 76 y.o., female Today's Date: 02/20/2022   PT End of Session - 02/19/22 1436     Visit Number 9    Number of Visits 17    Date for PT Re-Evaluation 02/06/22    Authorization Type Medicare    Authorization Time Period 12/05/21-02/06/22    Authorization - Visit Number 9    Authorization - Number of Visits 10    Progress Note Due on Visit 10    PT Start Time 8841    PT Stop Time 1433    PT Time Calculation (min) 38 min    Activity Tolerance Patient limited by pain;Patient tolerated treatment well    Behavior During Therapy [facility] for tasks assessed/performed                  Past Medical History:  Diagnosis Date   Anxiety    Arthritis    Bilateral chronic knee pain    Chronic back pain    Depression    GERD (gastroesophageal reflux disease)    Insomnia    Memory changes    Sinus bradycardia    Urge incontinence    Past Surgical History:  Procedure Laterality Date   ABDOMINAL HYSTERECTOMY  1995   back sugery     lumbar   BIOPSY THYROID     benign goiter   BUNIONECTOMY  2013   rt foot   COLONOSCOPY     MUSCLE BIOPSY Left 10/03/2012   Procedure: LEFT QUADRICEP MUSCLE BIOPSY;  Surgeon: Odis Hollingshead, MD;  Location: [place];  Service: General;  Laterality: Left;   NECK SURGERY  2010   cerv disc fused    Patient Active Problem List   Diagnosis Date Noted   Chronic back pain 08/07/2019   Depression 08/07/2019   Near syncope 08/06/2019   Sinus bradycardia 08/06/2019   Weakness 11/03/2012    PCP: Merrilee Seashore MD  REFERRING PROVIDER: Roni Bread MD  REFERRING DIAG: Rt TKA Medial approach  THERAPY DIAG:  Difficulty in walking, not elsewhere classified  Muscle weakness (generalized)  Unsteadiness on feet  Rationale for Evaluation and Treatment Rehabilitation  ONSET DATE: 12/05/21  SUBJECTIVE:   SUBJECTIVE  STATEMENT: Pt reports decreased pain rating to 2-3/10 and feels it has really calmed down swelling too. Pt reports no recent falls. Pt reports riding bike at home as well as walking in the house everyday.  PERTINENT HISTORY: Pt is a 76 year old female familiar with this clinic for previous episodes for RLE weakness/R foot drop. Patient with neurological deficits of RLE, particularly foot drop with unknown itiology. Pt presenting this episode with R knee TKA 12/03/21 in [place]. Ambulates into clinic with RW and R AFO donned (noted that AFO is not preventing foot drag).  Pt is currently on prescribed pain medication- trying to not take this due to side affects. Reports current/best pain level 6/10; worst 10/10.  Is wearing wrap around R knee, instructed to wear until returning next week. Wearing compression stockings bilat. Pt reports she has not seen PT yet, nor has any exercises. She has increased pain with all OOB activity, reports she is using RW for all mobility. Pt is retired, enjoyed walking outside at her neighborhood. PLOF modI, driving, completing all community and household ADLs with increased time needed d/t RLE weakness, and with SPC. Pt's husband passed recently and she is having a hard time with this.  Pt denies N/V, B&B changes, unexplained weight fluctuation, saddle paresthesia, fever, night sweats, or unrelenting night pain at this time.   Surgery: the jiffy knee medial approach in [place]  PAIN:  Are you having pain? Yes: NPRS scale: 2/10 Pain location: R knee  Pain description: throbbing, sharp  Aggravating factors: any activity up on her feet Relieving factors: ice, pain medication   PRECAUTIONS: Knee and Fall  WEIGHT BEARING RESTRICTIONS Yes WBAT     TODAY'S TREATMENT: Therex: Bike seat  13 L3 over 5 mins for mobility and gentle strengthening With LLE on foam sts from elevated mat 3x 10/8/8 with VC to push through RLE with good carry over Long blue airex lateral steps 2 reps left  and right Long blue airex tandem steps 3 reps forward and backwards Step ups 6 inch step RLE only, 2 sets of 8 reps Supine straight leg raise, 3 sets of 8, pt only able to complete eccentric contraction first set, 2nd and 3rd set 50% FROM with concentric contraction  Supine theraball roll up and roll out 12 reps, 2 second holds; pt cued for max knee flexion 100 ft walking with 5 pound AW on affected leg; pt cued for increased hip and knee flexion for increased foot clearance   PATIENT EDUCATION:  Education details: Patient was educated on diagnosis, anatomy and pathology involved, prognosis, role of PT, and was given an HEP, demonstrating exercise with proper form following verbal and tactile cues, and was given a paper hand out to continue exercise at home. Pt was educated on and agreed to plan of care.  Person educated: Patient Education method: Explanation, Demonstration, and Handouts Education comprehension: verbalized understanding and returned demonstration   HOME EXERCISE PROGRAM: [id number]  ASSESSMENT:  CLINICAL IMPRESSION: PT continued therex to focus on mobility and strength of R knee for continued progression towards level of function. Pt requires verbal and tactile cues during sit to stand activity as well as walking activities to address compensations during the movements. Pt shows improvement in straight leg raise movement. Pt demonstrated fatigue during walking exercises but was not limited by pain or swelling today which is an improvement. PT called [facility] in [place] and arranged appointment for patient mid December for further consideration of new AFO. Pt was given note with date and time of appointment as well as address of clinic with voiced consent from patient. Patient will continue to benefit from skilled physical therapy to improve R knee pain level, strength, and mobility for return to higher level of function and decreased fall risk during community  ambulation.  OBJECTIVE IMPAIRMENTS Abnormal gait, decreased activity tolerance, decreased balance, decreased cognition, decreased endurance, decreased knowledge of use of DME, decreased mobility, difficulty walking, decreased ROM, decreased strength, decreased safety awareness, increased fascial restrictions, impaired flexibility, impaired UE functional use, improper body mechanics, postural dysfunction, prosthetic dependency , and pain.   ACTIVITY LIMITATIONS carrying, lifting, bending, sitting, standing, squatting, sleeping, stairs, transfers, bed mobility, bathing, dressing, and locomotion level  PARTICIPATION LIMITATIONS: meal prep, cleaning, laundry, driving, shopping, community activity, and yard work  PERSONAL FACTORS Age, Fitness, Past/current experiences, Sex, Time since onset of injury/illness/exacerbation, Transportation, and 3+ comorbidities: OA, neurological impairment, chronic pain, a&d, GERD  are also affecting patient's functional outcome.   REHAB POTENTIAL: Fair prior impairment limiting optimal function level  CLINICAL DECISION MAKING: Evolving/moderate complexity  EVALUATION COMPLEXITY: Moderate   GOALS: Goals reviewed with patient? Yes  SHORT TERM GOALS: Target date: 03/20/2022  Pt will be independent with HEP  in order to improve strength and balance in order to decrease fall risk and improve function at home and work. Baseline:HEP given Goal status: INITIAL   LONG TERM GOALS: Target date:  02/06/22  Pt will decrease worst pain as reported on NPRS by at least 3 points in order to demonstrate clinically significant reduction in pain.  Baseline: 10/10 Goal status: INITIAL  2.  Patient will increase FOTO score to 41 to demonstrate predicted increase in functional mobility to complete ADLs  Baseline: 19 Goal status: INITIAL  3.  Pt will decrease 5TSTS to at least 13 seconds in order to demonstrate age matched norms of LE strength needed for heavy household  ADLs  Baseline: unable to complete Goal status: INITIAL  4.  Pt will increase 10MWT to at least 1.0 m/s in order to demonstrate clinically significant decreased impairment and fall risk in community ambulation.   Baseline: 0.55ms Goal status: INITIAL    PLAN: PT FREQUENCY: 1-2x/week  PT DURATION: 8 weeks  PLANNED INTERVENTIONS: Therapeutic exercises, Therapeutic activity, Neuromuscular re-education, Balance training, Gait training, Patient/Family education, Self Care, Joint mobilization, Joint manipulation, Stair training, DME instructions, Dry Needling, Electrical stimulation, Spinal manipulation, Spinal mobilization, Cryotherapy, Moist heat, Traction, Fluidotherapy, Manual therapy, and Re-evaluation  PLAN FOR NEXT SESSION: Use gait belt during walking activities     CDurwin RegesDPT  CDurwin Reges PT 02/20/2022, 8:10 AM

## 2022-02-26 ENCOUNTER — Ambulatory Visit: Payer: Medicare Other | Attending: Orthopedic Surgery | Admitting: Physical Therapy

## 2022-02-26 ENCOUNTER — Encounter: Payer: Self-pay | Admitting: Physical Therapy

## 2022-02-26 DIAGNOSIS — M6281 Muscle weakness (generalized): Secondary | ICD-10-CM | POA: Insufficient documentation

## 2022-02-26 DIAGNOSIS — R2681 Unsteadiness on feet: Secondary | ICD-10-CM | POA: Diagnosis not present

## 2022-02-26 DIAGNOSIS — R262 Difficulty in walking, not elsewhere classified: Secondary | ICD-10-CM | POA: Insufficient documentation

## 2022-02-26 NOTE — Therapy (Signed)
OUTPATIENT PHYSICAL THERAPY TREATMENT/PROGRESS NOTE Reporting Period 12/05/2021-02/26/2022   Patient Name: [name] MRN: [id number] DOB:1946-02-27, 76 y.o., female Today's Date: 02/27/2022   PT End of Session - 02/26/22 1348     Visit Number 10    Number of Visits 17    Date for PT Re-Evaluation 02/06/22    Authorization Type Medicare    Authorization Time Period 12/05/21-02/06/22    Authorization - Visit Number 10    Authorization - Number of Visits 10    Progress Note Due on Visit 10    PT Start Time 8546    PT Stop Time 1427    PT Time Calculation (min) 38 min    Equipment Utilized During Treatment Gait belt    Activity Tolerance Patient limited by pain;Patient tolerated treatment well    Behavior During Therapy [facility] for tasks assessed/performed                   Past Medical History:  Diagnosis Date   Anxiety    Arthritis    Bilateral chronic knee pain    Chronic back pain    Depression    GERD (gastroesophageal reflux disease)    Insomnia    Memory changes    Sinus bradycardia    Urge incontinence    Past Surgical History:  Procedure Laterality Date   ABDOMINAL HYSTERECTOMY  1995   back sugery     lumbar   BIOPSY THYROID     benign goiter   BUNIONECTOMY  2013   rt foot   COLONOSCOPY     MUSCLE BIOPSY Left 10/03/2012   Procedure: LEFT QUADRICEP MUSCLE BIOPSY;  Surgeon: Odis Hollingshead, MD;  Location: [place];  Service: General;  Laterality: Left;   NECK SURGERY  2010   cerv disc fused    Patient Active Problem List   Diagnosis Date Noted   Chronic back pain 08/07/2019   Depression 08/07/2019   Near syncope 08/06/2019   Sinus bradycardia 08/06/2019   Weakness 11/03/2012    PCP: Merrilee Seashore MD  REFERRING PROVIDER: Roni Bread MD  REFERRING DIAG: Rt TKA Medial approach  THERAPY DIAG:  Difficulty in walking, not elsewhere classified  Muscle weakness (generalized)  Unsteadiness on feet  Rationale for  Evaluation and Treatment Rehabilitation  ONSET DATE: 12/05/21  SUBJECTIVE:   SUBJECTIVE STATEMENT: Pt reports 4/10 pain upon presentation today. Pt reports noticing pain in other knee for the last couple of weeks and is wearing a compression sleeve on non surgical side. Pt reports compliance with HEP about 1-2 times between therapy session.  PERTINENT HISTORY: Pt is a 76 year old female familiar with this clinic for previous episodes for RLE weakness/R foot drop. Patient with neurological deficits of RLE, particularly foot drop with unknown itiology. Pt presenting this episode with R knee TKA 12/03/21 in [place]. Ambulates into clinic with RW and R AFO donned (noted that AFO is not preventing foot drag).  Pt is currently on prescribed pain medication- trying to not take this due to side affects. Reports current/best pain level 6/10; worst 10/10.  Is wearing wrap around R knee, instructed to wear until returning next week. Wearing compression stockings bilat. Pt reports she has not seen PT yet, nor has any exercises. She has increased pain with all OOB activity, reports she is using RW for all mobility. Pt is retired, enjoyed walking outside at her neighborhood. PLOF modI, driving, completing all community and household ADLs with increased time needed  d/t RLE weakness, and with SPC. Pt's husband passed recently and she is having a hard time with this. Pt denies N/V, B&B changes, unexplained weight fluctuation, saddle paresthesia, fever, night sweats, or unrelenting night pain at this time.   Surgery: the jiffy knee medial approach in [place]  PAIN:  Are you having pain? Yes: NPRS scale: 2/10 Pain location: R knee  Pain description: throbbing, sharp  Aggravating factors: any activity up on her feet Relieving factors: ice, pain medication   PRECAUTIONS: Knee and Fall  WEIGHT BEARING RESTRICTIONS Yes WBAT   EDEMA: circumferential 44.5 cm R 42.5 cm L  TODAY'S TREATMENT: Goal Reassessment Therex: Bike  seat  13 L3 over 5 mins for mobility and gentle strengthening 5 STS x2, pt cued for standing up tall at the last part of the movement Supine bridges 2 sets of 10 reps SLR 10 reps, 2 sets; pt cued for max hip flexion and knee extension. Stair ambulation 1 set; pt cued for upright posture    PATIENT EDUCATION:  Education details: Patient was educated on diagnosis, anatomy and pathology involved, prognosis, role of PT, and was given an HEP, demonstrating exercise with proper form following verbal and tactile cues, and was given a paper hand out to continue exercise at home. Pt was educated on and agreed to plan of care.  Person educated: Patient Education method: Explanation, Demonstration, and Handouts Education comprehension: verbalized understanding and returned demonstration   HOME EXERCISE PROGRAM: [id number]  ASSESSMENT:  CLINICAL IMPRESSION: PT continued therex to address RLE strength deficits. Patient was more fatigued during today's treatment than previous sessions, not able to perform the same volume. PT reviewed goals with pt and she has met 2 out of the 5 goals. Pt agreed with plan to continue therapy to progress towards remaining goals. Patient will continue to benefit from skilled physical therapy to improve RLE strength for improved performance during functional movements with ADL and increased ability to comply with community ambulation norms.   OBJECTIVE IMPAIRMENTS Abnormal gait, decreased activity tolerance, decreased balance, decreased cognition, decreased endurance, decreased knowledge of use of DME, decreased mobility, difficulty walking, decreased ROM, decreased strength, decreased safety awareness, increased fascial restrictions, impaired flexibility, impaired UE functional use, improper body mechanics, postural dysfunction, prosthetic dependency , and pain.   ACTIVITY LIMITATIONS carrying, lifting, bending, sitting, standing, squatting, sleeping, stairs, transfers, bed  mobility, bathing, dressing, and locomotion level  PARTICIPATION LIMITATIONS: meal prep, cleaning, laundry, driving, shopping, community activity, and yard work  PERSONAL FACTORS Age, Fitness, Past/current experiences, Sex, Time since onset of injury/illness/exacerbation, Transportation, and 3+ comorbidities: OA, neurological impairment, chronic pain, a&d, GERD  are also affecting patient's functional outcome.   REHAB POTENTIAL: Fair prior impairment limiting optimal function level  CLINICAL DECISION MAKING: Evolving/moderate complexity  EVALUATION COMPLEXITY: Moderate   GOALS: Goals reviewed with patient? Yes  SHORT TERM GOALS: Target date: 03/27/2022  Pt will be independent with HEP in order to improve strength and balance in order to decrease fall risk and improve function at home and work. Baseline:HEP given Goal status: IN PROGRESS(partially met 1-2 times between sessions)   LONG TERM GOALS: Target date:  02/06/22  Pt will decrease worst pain as reported on NPRS by at least 3 points in order to demonstrate clinically significant reduction in pain.  Baseline: 10/10; 02/26/2022: 4/10 Goal status: MET  2.  Patient will increase FOTO score to 41 to demonstrate predicted increase in functional mobility to complete ADLs  Baseline: 19  Goal status: INITIAL  3.  Pt will decrease 5TSTS to at least 13 seconds in order to demonstrate age matched norms of LE strength needed for heavy household ADLs  Baseline: unable to complete; 21/09/2021: 22.46 seconds Goal status: IN PROGRESS  4.  Pt will increase 10MWT to at least 1.0 m/s in order to demonstrate clinically significant decreased impairment and fall risk in community ambulation.   Baseline: 0.28ms; 02/26/2022: 0.475m  Goal status: IN PROGRESS    PLAN: PT FREQUENCY: 1-2x/week  PT DURATION: 8 weeks  PLANNED INTERVENTIONS: Therapeutic exercises, Therapeutic activity, Neuromuscular re-education, Balance training, Gait  training, Patient/Family education, Self Care, Joint mobilization, Joint manipulation, Stair training, DME instructions, Dry Needling, Electrical stimulation, Spinal manipulation, Spinal mobilization, Cryotherapy, Moist heat, Traction, Fluidotherapy, Manual therapy, and Re-evaluation  PLAN FOR NEXT SESSION: FOEnonPT  ChDurwin RegesPT 02/27/2022, 8:49 AM

## 2022-02-27 ENCOUNTER — Encounter: Payer: Self-pay | Admitting: Physical Therapy

## 2022-03-05 ENCOUNTER — Ambulatory Visit: Payer: Medicare Other | Admitting: Physical Therapy

## 2022-03-05 ENCOUNTER — Encounter: Payer: Self-pay | Admitting: Physical Therapy

## 2022-03-05 DIAGNOSIS — M6281 Muscle weakness (generalized): Secondary | ICD-10-CM

## 2022-03-05 DIAGNOSIS — R2681 Unsteadiness on feet: Secondary | ICD-10-CM

## 2022-03-05 DIAGNOSIS — R262 Difficulty in walking, not elsewhere classified: Secondary | ICD-10-CM

## 2022-03-05 NOTE — Therapy (Signed)
OUTPATIENT PHYSICAL THERAPY TREATMENT/PROGRESS NOTE Reporting Period 12/05/2021-02/26/2022   Patient Name: [name] MRN: [id number] DOB:Oct 25, 1945, 76 y.o., female Today's Date: 03/05/2022   PT End of Session - 03/05/22 1358     Visit Number 11    Number of Visits 17    Date for PT Re-Evaluation 02/06/22    Authorization Type Medicare    Authorization Time Period 12/05/21-02/06/22    Authorization - Visit Number 11    Authorization - Number of Visits 17    Progress Note Due on Visit 20    PT Start Time 2376    PT Stop Time 1425    PT Time Calculation (min) 29 min    Activity Tolerance Patient limited by pain;Patient tolerated treatment well    Behavior During Therapy [facility] for tasks assessed/performed                    Past Medical History:  Diagnosis Date   Anxiety    Arthritis    Bilateral chronic knee pain    Chronic back pain    Depression    GERD (gastroesophageal reflux disease)    Insomnia    Memory changes    Sinus bradycardia    Urge incontinence    Past Surgical History:  Procedure Laterality Date   ABDOMINAL HYSTERECTOMY  1995   back sugery     lumbar   BIOPSY THYROID     benign goiter   BUNIONECTOMY  2013   rt foot   COLONOSCOPY     MUSCLE BIOPSY Left 10/03/2012   Procedure: LEFT QUADRICEP MUSCLE BIOPSY;  Surgeon: Odis Hollingshead, MD;  Location: [place];  Service: General;  Laterality: Left;   NECK SURGERY  2010   cerv disc fused    Patient Active Problem List   Diagnosis Date Noted   Chronic back pain 08/07/2019   Depression 08/07/2019   Near syncope 08/06/2019   Sinus bradycardia 08/06/2019   Weakness 11/03/2012    PCP: Merrilee Seashore MD  REFERRING PROVIDER: Roni Bread MD  REFERRING DIAG: Rt TKA Medial approach  THERAPY DIAG:  Difficulty in walking, not elsewhere classified  Muscle weakness (generalized)  Unsteadiness on feet  Rationale for Evaluation and Treatment  Rehabilitation  ONSET DATE: 12/05/21  SUBJECTIVE:   SUBJECTIVE STATEMENT: Pt reports doing well with her R knee, is having some compensatory pain in L knee 6/10 to date. Has appt with ortho/prosthetics 12/19.   PERTINENT HISTORY: Pt is a 76 year old female familiar with this clinic for previous episodes for RLE weakness/R foot drop. Patient with neurological deficits of RLE, particularly foot drop with unknown itiology. Pt presenting this episode with R knee TKA 12/03/21 in [place]. Ambulates into clinic with RW and R AFO donned (noted that AFO is not preventing foot drag).  Pt is currently on prescribed pain medication- trying to not take this due to side affects. Reports current/best pain level 6/10; worst 10/10.  Is wearing wrap around R knee, instructed to wear until returning next week. Wearing compression stockings bilat. Pt reports she has not seen PT yet, nor has any exercises. She has increased pain with all OOB activity, reports she is using RW for all mobility. Pt is retired, enjoyed walking outside at her neighborhood. PLOF modI, driving, completing all community and household ADLs with increased time needed d/t RLE weakness, and with SPC. Pt's husband passed recently and she is having a hard time with this. Pt denies N/V, B&B changes,  unexplained weight fluctuation, saddle paresthesia, fever, night sweats, or unrelenting night pain at this time.   Surgery: the jiffy knee medial approach in [place]  PAIN:  Are you having pain? Yes: NPRS scale: 2/10 Pain location: R knee  Pain description: throbbing, sharp  Aggravating factors: any activity up on her feet Relieving factors: ice, pain medication   PRECAUTIONS: Knee and Fall  WEIGHT BEARING RESTRICTIONS Yes WBAT   EDEMA: circumferential 44.5 cm R 42.5 cm L  TODAY'S TREATMENT:  Therex: Bike seat  13 L3 78mns; L4 227ms for mobility and gentle strengthening STS with fast stand phase x6 with UE needed, difficulty with balance Mini squat  with fast stand phase x6 with cuing for upright trunk with decent carry over Backward R step up with unilateral handrail support 2x 6 with difficulty with eccentric control; supervision for safety RLE leg press 2x 10 20# with cuing for eccentric control    PATIENT EDUCATION:  Education details: Patient was educated on diagnosis, anatomy and pathology involved, prognosis, role of PT, and was given an HEP, demonstrating exercise with proper form following verbal and tactile cues, and was given a paper hand out to continue exercise at home. Pt was educated on and agreed to plan of care.  Person educated: Patient Education method: Explanation, Demonstration, and Handouts Education comprehension: verbalized understanding and returned demonstration   HOME EXERCISE PROGRAM: [id number]:  CLINICAL IMPRESSION: PT continued therex to address RLE strength deficits. Patient with more compliant of L knee pain due to compensating for lack of RLE strength. Pt with good effort and no increased pain throughout session. Patient will continue to benefit from skilled physical therapy to improve RLE strength for improved performance during functional movements with ADL and increased ability to comply with community ambulation norms.   OBJECTIVE IMPAIRMENTS Abnormal gait, decreased activity tolerance, decreased balance, decreased cognition, decreased endurance, decreased knowledge of use of DME, decreased mobility, difficulty walking, decreased ROM, decreased strength, decreased safety awareness, increased fascial restrictions, impaired flexibility, impaired UE functional use, improper body mechanics, postural dysfunction, prosthetic dependency , and pain.   ACTIVITY LIMITATIONS carrying, lifting, bending, sitting, standing, squatting, sleeping, stairs, transfers, bed mobility, bathing, dressing, and locomotion level  PARTICIPATION LIMITATIONS: meal prep, cleaning, laundry, driving, shopping, community  activity, and yard work  PERSONAL FACTORS Age, Fitness, Past/current experiences, Sex, Time since onset of injury/illness/exacerbation, Transportation, and 3+ comorbidities: OA, neurological impairment, chronic pain, a&d, GERD  are also affecting patient's functional outcome.   REHAB POTENTIAL: Fair prior impairment limiting optimal function level  CLINICAL DECISION MAKING: Evolving/moderate complexity  EVALUATION COMPLEXITY: Moderate   GOALS: Goals reviewed with patient? Yes  SHORT TERM GOALS: Target date: 04/02/2022  Pt will be independent with HEP in order to improve strength and balance in order to decrease fall risk and improve function at home and work. Baseline:HEP given Goal status: IN PROGRESS(partially met 1-2 times between sessions)   LONG TERM GOALS: Target date:  02/06/22  Pt will decrease worst pain as reported on NPRS by at least 3 points in order to demonstrate clinically significant reduction in pain.  Baseline: 10/10; 02/26/2022: 4/10 Goal status: MET  2.  Patient will increase FOTO score to 41 to demonstrate predicted increase in functional mobility to complete ADLs  Baseline: 19  Goal status: INITIAL  3.  Pt will decrease 5TSTS to at least 13 seconds in order to demonstrate age matched norms of LE strength needed for heavy household ADLs  Baseline: unable to complete; 21/09/2021: 22.46  seconds Goal status: IN PROGRESS  4.  Pt will increase 10MWT to at least 1.0 m/s in order to demonstrate clinically significant decreased impairment and fall risk in community ambulation.   Baseline: 0.43ms; 02/26/2022: 0.422m  Goal status: IN PROGRESS    PLAN: PT FREQUENCY: 1-2x/week  PT DURATION: 8 weeks  PLANNED INTERVENTIONS: Therapeutic exercises, Therapeutic activity, Neuromuscular re-education, Balance training, Gait training, Patient/Family education, Self Care, Joint mobilization, Joint manipulation, Stair training, DME instructions, Dry Needling, Electrical  stimulation, Spinal manipulation, Spinal mobilization, Cryotherapy, Moist heat, Traction, Fluidotherapy, Manual therapy, and Re-evaluation  PLAN FOR NEXT SESSION: FOTogiakPT  ChDurwin RegesPT 03/05/2022, 3:07 PM

## 2022-03-07 DIAGNOSIS — T1490XA Injury, unspecified, initial encounter: Secondary | ICD-10-CM | POA: Diagnosis not present

## 2022-03-07 DIAGNOSIS — S0990XA Unspecified injury of head, initial encounter: Secondary | ICD-10-CM | POA: Diagnosis not present

## 2022-03-07 DIAGNOSIS — M25461 Effusion, right knee: Secondary | ICD-10-CM | POA: Diagnosis not present

## 2022-03-07 DIAGNOSIS — S12400A Unspecified displaced fracture of fifth cervical vertebra, initial encounter for closed fracture: Secondary | ICD-10-CM | POA: Diagnosis not present

## 2022-03-12 ENCOUNTER — Ambulatory Visit: Payer: Medicare Other | Admitting: Physical Therapy

## 2022-03-19 ENCOUNTER — Ambulatory Visit: Payer: Medicare Other | Admitting: Physical Therapy

## 2022-03-22 DIAGNOSIS — F419 Anxiety disorder, unspecified: Secondary | ICD-10-CM | POA: Diagnosis not present

## 2022-03-22 DIAGNOSIS — K219 Gastro-esophageal reflux disease without esophagitis: Secondary | ICD-10-CM | POA: Diagnosis not present

## 2022-03-22 DIAGNOSIS — G4700 Insomnia, unspecified: Secondary | ICD-10-CM | POA: Diagnosis not present

## 2022-03-22 DIAGNOSIS — M170 Bilateral primary osteoarthritis of knee: Secondary | ICD-10-CM | POA: Diagnosis not present

## 2022-03-26 ENCOUNTER — Ambulatory Visit: Payer: Medicare Other | Attending: Orthopedic Surgery | Admitting: Physical Therapy

## 2022-03-26 DIAGNOSIS — R262 Difficulty in walking, not elsewhere classified: Secondary | ICD-10-CM | POA: Diagnosis not present

## 2022-03-26 DIAGNOSIS — M6281 Muscle weakness (generalized): Secondary | ICD-10-CM | POA: Insufficient documentation
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
## 2022-04-02 ENCOUNTER — Ambulatory Visit: Payer: Medicare Other | Admitting: Physical Therapy

## 2022-04-09 ENCOUNTER — Encounter: Payer: Medicare Other | Admitting: Physical Therapy

## 2022-04-16 ENCOUNTER — Encounter: Payer: Medicare Other | Admitting: Physical Therapy

## 2022-04-23 ENCOUNTER — Encounter: Payer: Medicare Other | Admitting: Physical Therapy

## 2022-04-30 ENCOUNTER — Encounter: Payer: Medicare Other | Admitting: Physical Therapy

## 2022-05-07 ENCOUNTER — Encounter: Payer: Medicare Other | Admitting: Physical Therapy

## 2022-05-14 ENCOUNTER — Encounter: Payer: Medicare Other | Admitting: Physical Therapy

## 2022-05-21 ENCOUNTER — Encounter: Payer: Medicare Other | Admitting: Physical Therapy

## 2022-12-17 LAB — COLOGUARD: COLOGUARD: NEGATIVE
